# Patient Record
Sex: Female | Born: 1945 | Race: White | Hispanic: No | Smoking: Former smoker
Health system: Southern US, Community
[De-identification: ages and names within clinical notes are randomized; demographics above are authoritative.]

## PROBLEM LIST (undated history)

## (undated) DIAGNOSIS — G4733 Obstructive sleep apnea (adult) (pediatric): Secondary | ICD-10-CM

## (undated) DIAGNOSIS — N189 Chronic kidney disease, unspecified: Secondary | ICD-10-CM

## (undated) DIAGNOSIS — M7552 Bursitis of left shoulder: Secondary | ICD-10-CM

## (undated) DIAGNOSIS — Z8719 Personal history of other diseases of the digestive system: Secondary | ICD-10-CM

## (undated) DIAGNOSIS — I351 Nonrheumatic aortic (valve) insufficiency: Secondary | ICD-10-CM

## (undated) DIAGNOSIS — Z9889 Other specified postprocedural states: Secondary | ICD-10-CM

## (undated) DIAGNOSIS — I10 Essential (primary) hypertension: Secondary | ICD-10-CM

## (undated) DIAGNOSIS — J45909 Unspecified asthma, uncomplicated: Secondary | ICD-10-CM

## (undated) DIAGNOSIS — E669 Obesity, unspecified: Secondary | ICD-10-CM

## (undated) DIAGNOSIS — M1990 Unspecified osteoarthritis, unspecified site: Secondary | ICD-10-CM

## (undated) DIAGNOSIS — Z9989 Dependence on other enabling machines and devices: Secondary | ICD-10-CM

## (undated) DIAGNOSIS — D649 Anemia, unspecified: Secondary | ICD-10-CM

## (undated) DIAGNOSIS — R079 Chest pain, unspecified: Secondary | ICD-10-CM

## (undated) DIAGNOSIS — I340 Nonrheumatic mitral (valve) insufficiency: Secondary | ICD-10-CM

## (undated) DIAGNOSIS — I480 Paroxysmal atrial fibrillation: Secondary | ICD-10-CM

## (undated) DIAGNOSIS — E039 Hypothyroidism, unspecified: Secondary | ICD-10-CM

## (undated) DIAGNOSIS — I509 Heart failure, unspecified: Secondary | ICD-10-CM

## (undated) DIAGNOSIS — E785 Hyperlipidemia, unspecified: Secondary | ICD-10-CM

## (undated) DIAGNOSIS — D369 Benign neoplasm, unspecified site: Secondary | ICD-10-CM

## (undated) DIAGNOSIS — E559 Vitamin D deficiency, unspecified: Secondary | ICD-10-CM

## (undated) DIAGNOSIS — G43909 Migraine, unspecified, not intractable, without status migrainosus: Secondary | ICD-10-CM

## (undated) DIAGNOSIS — K219 Gastro-esophageal reflux disease without esophagitis: Secondary | ICD-10-CM

## (undated) DIAGNOSIS — R112 Nausea with vomiting, unspecified: Secondary | ICD-10-CM

## (undated) DIAGNOSIS — J42 Unspecified chronic bronchitis: Secondary | ICD-10-CM

## (undated) HISTORY — DX: Unspecified asthma, uncomplicated: J45.909

## (undated) HISTORY — DX: Nonrheumatic mitral (valve) insufficiency: I34.0

## (undated) HISTORY — PX: HERNIA REPAIR: SHX51

## (undated) HISTORY — DX: Chronic kidney disease, unspecified: N18.9

## (undated) HISTORY — DX: Nonrheumatic aortic (valve) insufficiency: I35.1

## (undated) HISTORY — DX: Paroxysmal atrial fibrillation: I48.0

## (undated) HISTORY — DX: Chest pain, unspecified: R07.9

## (undated) HISTORY — DX: Vitamin D deficiency, unspecified: E55.9

## (undated) HISTORY — DX: Gastro-esophageal reflux disease without esophagitis: K21.9

## (undated) HISTORY — DX: Essential (primary) hypertension: I10

## (undated) HISTORY — PX: APPENDECTOMY: SHX54

## (undated) HISTORY — DX: Hyperlipidemia, unspecified: E78.5

## (undated) HISTORY — DX: Obesity, unspecified: E66.9

## (undated) HISTORY — PX: CATARACT EXTRACTION W/ INTRAOCULAR LENS  IMPLANT, BILATERAL: SHX1307

## (undated) HISTORY — DX: Migraine, unspecified, not intractable, without status migrainosus: G43.909

## (undated) HISTORY — DX: Benign neoplasm, unspecified site: D36.9

---
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
## 1972-04-15 HISTORY — PX: TONSILLECTOMY AND ADENOIDECTOMY: SUR1326

## 1979-04-16 HISTORY — PX: COMBINED HYSTERECTOMY VAGINAL / OOPHORECTOMY / A&P REPAIR: SUR294

## 1979-04-16 HISTORY — PX: VAGINAL HYSTERECTOMY: SUR661

## 1997-07-21 ENCOUNTER — Encounter: Admission: RE | Admit: 1997-07-21 | Discharge: 1997-10-19 | Payer: Self-pay | Admitting: *Deleted

## 1998-04-03 ENCOUNTER — Emergency Department (HOSPITAL_COMMUNITY): Admission: EM | Admit: 1998-04-03 | Discharge: 1998-04-03 | Payer: Self-pay | Admitting: Emergency Medicine

## 1998-05-26 ENCOUNTER — Ambulatory Visit (HOSPITAL_COMMUNITY): Admission: RE | Admit: 1998-05-26 | Discharge: 1998-05-26 | Payer: Self-pay | Admitting: Gastroenterology

## 1999-04-05 ENCOUNTER — Encounter: Admission: RE | Admit: 1999-04-05 | Discharge: 1999-04-05 | Payer: Self-pay | Admitting: *Deleted

## 1999-04-05 ENCOUNTER — Encounter: Payer: Self-pay | Admitting: *Deleted

## 1999-07-06 ENCOUNTER — Encounter (INDEPENDENT_AMBULATORY_CARE_PROVIDER_SITE_OTHER): Payer: Self-pay | Admitting: *Deleted

## 1999-07-06 ENCOUNTER — Ambulatory Visit (HOSPITAL_COMMUNITY): Admission: RE | Admit: 1999-07-06 | Discharge: 1999-07-06 | Payer: Self-pay | Admitting: Gastroenterology

## 2000-04-10 ENCOUNTER — Encounter: Admission: RE | Admit: 2000-04-10 | Discharge: 2000-04-10 | Payer: Self-pay | Admitting: *Deleted

## 2000-04-10 ENCOUNTER — Encounter: Payer: Self-pay | Admitting: *Deleted

## 2001-05-07 ENCOUNTER — Encounter: Payer: Self-pay | Admitting: *Deleted

## 2001-05-07 ENCOUNTER — Encounter: Admission: RE | Admit: 2001-05-07 | Discharge: 2001-05-07 | Payer: Self-pay | Admitting: *Deleted

## 2001-12-27 ENCOUNTER — Encounter: Payer: Self-pay | Admitting: Emergency Medicine

## 2001-12-27 ENCOUNTER — Emergency Department (HOSPITAL_COMMUNITY): Admission: EM | Admit: 2001-12-27 | Discharge: 2001-12-27 | Payer: Self-pay | Admitting: Emergency Medicine

## 2002-02-19 ENCOUNTER — Ambulatory Visit (HOSPITAL_COMMUNITY): Admission: RE | Admit: 2002-02-19 | Discharge: 2002-02-19 | Payer: Self-pay | Admitting: Cardiology

## 2002-05-20 ENCOUNTER — Encounter: Admission: RE | Admit: 2002-05-20 | Discharge: 2002-05-20 | Payer: Self-pay

## 2004-06-28 ENCOUNTER — Encounter: Admission: RE | Admit: 2004-06-28 | Discharge: 2004-06-28 | Payer: Self-pay | Admitting: Family Medicine

## 2005-08-22 ENCOUNTER — Encounter: Admission: RE | Admit: 2005-08-22 | Discharge: 2005-08-22 | Payer: Self-pay | Admitting: Family Medicine

## 2005-12-05 ENCOUNTER — Encounter: Admission: RE | Admit: 2005-12-05 | Discharge: 2005-12-05 | Payer: Self-pay | Admitting: Family Medicine

## 2005-12-12 ENCOUNTER — Encounter: Admission: RE | Admit: 2005-12-12 | Discharge: 2005-12-12 | Payer: Self-pay | Admitting: Family Medicine

## 2006-09-17 ENCOUNTER — Encounter: Admission: RE | Admit: 2006-09-17 | Discharge: 2006-09-17 | Payer: Self-pay | Admitting: Family Medicine

## 2007-09-23 ENCOUNTER — Encounter: Admission: RE | Admit: 2007-09-23 | Discharge: 2007-09-23 | Payer: Self-pay | Admitting: Family Medicine

## 2008-07-22 ENCOUNTER — Ambulatory Visit (HOSPITAL_COMMUNITY): Admission: RE | Admit: 2008-07-22 | Discharge: 2008-07-22 | Payer: Self-pay | Admitting: Surgery

## 2008-08-02 ENCOUNTER — Encounter: Admission: RE | Admit: 2008-08-02 | Discharge: 2008-10-31 | Payer: Self-pay | Admitting: Surgery

## 2008-09-13 HISTORY — PX: LAPAROSCOPIC GASTRIC BANDING WITH HIATAL HERNIA REPAIR: SHX6351

## 2008-09-15 ENCOUNTER — Ambulatory Visit (HOSPITAL_COMMUNITY): Admission: RE | Admit: 2008-09-15 | Discharge: 2008-09-16 | Payer: Self-pay | Admitting: Surgery

## 2008-10-10 ENCOUNTER — Encounter: Admission: RE | Admit: 2008-10-10 | Discharge: 2008-10-10 | Payer: Self-pay | Admitting: Family Medicine

## 2008-11-23 ENCOUNTER — Encounter: Admission: RE | Admit: 2008-11-23 | Discharge: 2008-12-22 | Payer: Self-pay | Admitting: Surgery

## 2009-03-29 ENCOUNTER — Encounter: Admission: RE | Admit: 2009-03-29 | Discharge: 2009-04-12 | Payer: Self-pay | Admitting: Surgery

## 2009-10-12 ENCOUNTER — Encounter: Admission: RE | Admit: 2009-10-12 | Discharge: 2009-10-12 | Payer: Self-pay | Admitting: Family Medicine

## 2010-02-25 ENCOUNTER — Emergency Department (HOSPITAL_COMMUNITY): Admission: EM | Admit: 2010-02-25 | Discharge: 2010-02-25 | Payer: Self-pay | Admitting: Emergency Medicine

## 2010-06-27 LAB — POCT CARDIAC MARKERS
CKMB, poc: <1 ng/mL — ABNORMAL LOW (ref 1.0–8.0)
Myoglobin, poc: 71.1 ng/mL (ref 12–200)
Troponin i, poc: <0.05 ng/mL (ref 0.00–0.09)

## 2010-06-27 LAB — POCT I-STAT, CHEM 8
Calcium, Ion: 1.04 mmol/L — ABNORMAL LOW (ref 1.12–1.32)
Chloride: 104 mEq/L (ref 96–112)
Glucose, Bld: 97 mg/dL (ref 70–99)
HCT: 47 % — ABNORMAL HIGH (ref 36.0–46.0)
Hemoglobin: 16 g/dL — ABNORMAL HIGH (ref 12.0–15.0)
Potassium: 3.9 mEq/L (ref 3.5–5.1)
TCO2: 28 mmol/L (ref 0–100)

## 2010-07-23 LAB — CBC
HCT: 36.8 % (ref 36.0–46.0)
MCHC: 32.4 g/dL (ref 30.0–36.0)
MCV: 78 fL (ref 78.0–100.0)
Platelets: 294 10*3/uL (ref 150–400)
WBC: 15.5 10*3/uL — ABNORMAL HIGH (ref 4.0–10.5)

## 2010-07-23 LAB — DIFFERENTIAL
Eosinophils Relative: 0 % (ref 0–5)
Lymphocytes Relative: 5 % — ABNORMAL LOW (ref 12–46)
Monocytes Absolute: 0.7 10*3/uL (ref 0.1–1.0)

## 2010-07-24 LAB — COMPREHENSIVE METABOLIC PANEL
AST: 29 U/L (ref 0–37)
BUN: 24 mg/dL — ABNORMAL HIGH (ref 6–23)
CO2: 28 mEq/L (ref 19–32)
GFR calc Af Amer: 60 mL/min — ABNORMAL LOW (ref >60)
GFR calc non Af Amer: 49 mL/min — ABNORMAL LOW (ref >60)
Glucose, Bld: 96 mg/dL (ref 70–99)
Sodium: 140 mEq/L (ref 135–145)

## 2010-07-24 LAB — DIFFERENTIAL
Basophils Relative: 0 % (ref 0–1)
Eosinophils Absolute: 0.2 10*3/uL (ref 0.0–0.7)
Eosinophils Relative: 1 % (ref 0–5)
Lymphocytes Relative: 16 % (ref 12–46)
Lymphs Abs: 1.9 10*3/uL (ref 0.7–4.0)
Neutrophils Relative %: 77 % (ref 43–77)

## 2010-08-28 NOTE — Op Note (Signed)
NAMEGARY, BULTMAN               ACCOUNT NO.:  [id number]   MEDICAL RECORD NO.:  [id number]          PATIENT TYPE:  OIB   LOCATION:  0098                         FACILITY:  [facility]   PHYSICIAN:  Sandria Bales. Ezzard Standing, M.D.  DATE OF BIRTH:  01-Apr-1946   DATE OF PROCEDURE:  09/15/2008  DATE OF DISCHARGE:                               OPERATIVE REPORT   Date of Surgery - 15 September 2008.   PREOPERATIVE DIAGNOSES:  Morbid obesity (weight 299, BMI of 47.5),  hiatal hernia.   POSTOPERATIVE DIAGNOSES:  Morbid obesity  (weight 299, will BMI of  47.5), small hiatal hernia with Barrett's esophagitis.   PROCEDURE:  Laparoscopic band with APL band, subcutaneous port, excision  of the anterior gastric fat pad, hiatal hernia repair with single  stitch, upper endoscopy.   SURGEON:  Sandria Bales. Ezzard Standing, M.D.   FIRST ASSISTANT:  Dr. Wenda Low.   ANESTHESIA:  General endotracheal with 30 mL of 0.25% Marcaine.   COMPLICATIONS:  None.   INDICATIONS FOR PROCEDURE:  Ms. Huard is a 65 year old white female who  is a patient of Shaune Pollack, has been morbidly obese much of her adult  life.  She has been through our preoperative bariatric program and now  comes for attempted laparoscopic banding placement.   The indications and potential complications of the band have been  explained to the patient.   She has also been seen by Dr. Matthias Hughs and been found to have reflux with  Barrett esophagitis.  She does have a small hiatal hernia on upper GI so  we will look at her hiatus of the time of surgery.   The potential complication of lap band surgery include, but are not  limited to, bleeding, infection, perforation of bowel, slippage or  erosion of the band and long-term nutritional consequences.   OPERATIVE NOTE:  The patient placed in supine position, given a general  endotracheal anesthetic.  Her abdomen was prepped with a ChloraPrep and  sterilely draped.  A time-out was held identifying the patient and  the  procedure.   I accessed the abdominal cavity through a left upper quadrant using 11  mm Ethicon OptiVu trocar.  I then placed 5 additional trocars, a 5-mm  subxiphoid trocar for the liver retractor, a 15-mm right subcostal  trocar for band introduction, an 11 mm right paramedian trocar, an 11 mm  paramedian Ethicon trocar and then a 5 mm lateral trocar.   The abdomen was explored laparoscopically.  Right and left lobes of  liver were unremarkable.  There were some fatty changes.  I could not  see the gallbladder.  The stomach was unremarkable.  The bowel that I  could see was unremarkable.  She really had a fairly generous omentum  that covered most of her bowel.   I then turned my attention to her gastroesophageal junction.  I opened a  window to the left of the gastroesophageal junction, identified the left  crus.  I dissected out  the right crus.  I dissected posteriorly and  passed the finger dissector posteriorly to the gastroesophageal junction  and  introduced an AP large lap band. I passed the tubing around.  I then  carried out dissection trying to look for a hiatal hernia.  We tried  multiple times trying to pass the sizing tube and both the nurse  anesthetist and the anesthesiologist could not get the sizing tube down.   At this point Dr. Wenda Low broke scrub to pass the flexible Olympus  endoscope down to the stomach and noted the esophagogastric junction at  about 40 cm.  She did have some small erosions immediately proximal to  the gastroesophageal junction which probably represented the Barrett's  esophagus.  She did not have much of a hiatal hernia endoscopically.  We  did get the scope into the stomach.  We then used that to make sure we  had identified her GE junction.  I then carried out the dissection  posterior to just to see if I could find much of a hiatal hernia.  She  did have some separation with a moderate defect in the hiatus.  I did  put a single  suture suturing the left crus to the right crus posteriorly  using a 0 Ethibond  suture with a Ti-KNOT.   Since I could not really do a good sizing test, I was uncomfortable  putting any more sutures posteriorly and let this be the only suture.   I then advanced the AP large band around the proximal stomach.  I  snapped it closed over the endoscope.  The endoscope was then removed.  I then imbricated the stomach over the band using three 0 Ethibond  sutures.  Prior to placing the band, I excised a generous fat pad which  was probably 8 or 9 cm in length about 2 to 3 cm in thickness.  I took  this off the anterior stomach using a harmonic scalpel and I thought I  was well away from the stomach wall during this dissection and there was  no active bleeding.  Again after I snapped the band down, I used the  Ethibond sutures and placed three imbricating sutures.  I then took  photos at the end of the procedure where the band appeared to lie in a  good position.   There was no bleeding at either dissection site around the band or  around the stomach and the abdomen was desufflated, the trocars removed  in turn with no bleeding at the trocar sites.   The tubing of the band was brought out through the right paramedian  incision.  This was attached to the port because she has such a thick  abdominal wall.  I did close this as a subcutaneous port with mesh  backing on the back of the port.   The port lay flat lateral to the incision.  Each incision was closed  with 5-0 Vicryl suture was painted with tincture of Benzoin and steri-  stripped.  The patient tolerated the procedure well, was transported to  recovery room in good condition.  Sponge and needle count were correct  at the end of the case.      Sandria Bales. Ezzard Standing, M.D.  Electronically Signed     DHN/MEDQ  D:  09/15/2008  T:  09/15/2008  Job:  [id number]   cc:   [name], M.D.  Fax: [phone]   Bernette Redbird, M.D.  Fax:  [phone]

## 2010-08-31 NOTE — H&P (Signed)
NAME:  Leslie [name], Leslie [name]                         ACCOUNT NO.:  [id number]   MEDICAL RECORD NO.:  [id number]                   PATIENT TYPE:   LOCATION:                                       FACILITY:  [facility]   PHYSICIAN:  Colleen Can. Deborah Chalk, M.D.            DATE OF BIRTH:  [birth year]   DATE OF ADMISSION:  02/19/2002  DATE OF DISCHARGE:                                HISTORY & PHYSICAL   CHIEF COMPLAINT:  Previous episode of chest pain.   HISTORY OF PRESENT ILLNESS:  Ms. Leslie [name] is a 65 year old obese white female  who was referred to our office for evaluation in the later part of 9/03. She  was seen after being evaluated in the emergency room for chest pain on  12/27/01. With that, she had a pressure-like sensation in the left chest that  radiated to the left shoulder and was somewhat associated with tingling of  her arms and fingers on both hands. She had some vague nausea as well. Two  days prior to that, she had been exercising at [employer] and noted that she had  felt quite well. She was wondering, perhaps, if this could be  musculoskeletal discomfort in origin. Her blood pressure has been elevated  as well. Her evaluation in the emergency room was unremarkable. She was  referred for stress Cardiolite study which was performed on 01/28/02. She  exercised on a standard Bruce protocol for a total of 4 minutes and 31  seconds to a maximum of 2.5 miles per hour at 12% grade. Her heart rate rose  to 158, blood pressure rose to 150/80. The test was stopped due to right leg  discomfort. There were no complaints of angina.   Her resting electrocardiogram showed nonspecific changes. With exercise,  there were no changes to suggest ischemia. Her ejection fraction was 69%.  There was ischemia in the anterior wall that was noted. In light of these  findings, she is referred on now for elective cardiac catheterization.   PAST MEDICAL HISTORY:  1. Obesity.  2. Hypertension.  3. Hyperlipidemia.  4. History of left ventricular hypertrophy with last 2-D echocardiogram     dating back to 1997.  5. History of palpitations.  6. Previous left inguinal hernia repair.  7. Tonsillectomy.  8. Vaginal hysterectomy.  9. Appendectomy.  10.      History of left oophorectomy.  11.      Childbirth x2.   ALLERGIES:  Demerol and codeine.   CURRENT MEDICATIONS:  1. Toprol-XL 100 mg a day.  2. Allegra b.i.d.  3. Premarin daily.  4. Flonase daily.  5. Prilosec 20 mg a day.  6. Diovan HCT 80/12.5 daily.   FAMILY HISTORY:  Father died at 27 with heart disease and emphysema. Mother  at 93 of pulmonary edema.   SOCIAL HISTORY:  She has no tobacco use and only uses social alcohol.   REVIEW OF SYMPTOMS:  As noted above and is otherwise unremarkable.   PHYSICAL EXAMINATION:  VITAL SIGNS:  Weight 254 pounds, blood pressure  110/70 sitting-114/80 standing, heart rate is 88 and regular, respirations  18. She is afebrile.  SKIN:  Warm and dry. Color is unremarkable.  LUNGS:  Clear.  HEART:  Shows a regular rhythm.  ABDOMEN:  Obese, soft, positive bowel sounds.  EXTREMITIES:  Without edema.  NEUROLOGICAL:  Intact.   LABORATORY DATA:  Pending.   IMPRESSION:  1. Previous episode of chest pain in the setting of an abnormal stress     Cardiolite study.  2. Hypertension.  3. Obesity.   PLAN:  We will proceed on with elective cardiac catheterization.     Juanell Fairly C. Earl Gala, N.P.                 Colleen Can. Deborah Chalk, M.D.    LCO/MEDQ  D:  02/10/2002  T:  02/10/2002  Job:  [id number]   cc:   Heather Roberts, M.D.  [address]  [place]  [place] [address]  Fax: [phone]

## 2010-08-31 NOTE — Procedures (Signed)
[place]  Patient:    Leslie [name], Leslie [name]                      MRN: [id number] Proc. Date: 07/06/99 Adm. Date:  [id number] Attending:  Rich Brave CC:         Heather Roberts, M.D.                           Procedure Report  PROCEDURE:  Upper endoscopy with biopsies.  INDICATION:  Followup of Barretts esophagus with biopsies a year ago showing no  evidence of dysplasia.  ENDOSCOPIST:  Florencia Reasons, M.D.  FINDINGS:  Short segment Barretts esophagus, biopsied.  Small amount of free gastroesophageal reflux and small hiatal hernia present.  DESCRIPTION OF PROCEDURE:  The nature, purpose, risks of the procedure were discussed with the patient prior to examination, and she provided written consent. Sedation was first premedication with Zofran 4 mg IV because of history of severe nausea in association with prior anesthesia, droperidol 5 mg, and Versed 10 mg V to a level of mild to moderate sedation without arrhythmias or desaturation.  The Olympus small-caliber adult video endoscope was passed easily under direct vision.  The larynx and vocal cords were unremarkable.  The esophagus was easily entered.  The proximal esophagus was normal, but there was Barretts esophagus involving the distal region of the esophagus, extending in a tongue-like fashion 1 or 2 cm up the esophagus.  Multiple biopsies were obtained from these "tongues" at the conclusion of the procedure.  There was a 2 cm hiatal hernia present.  The stomach was entered.  It contained no significant residual, just a small to moderate amount of clear fluid which was suctioned out.  No gastritis, erosions, ulcers, polyps, or masses were seen, and a retroflex view of the proximal stomach showed a slightly patulous diaphragmatic hiatus and the hiatal hernia from its inferior perspective. The pylorus, duodenal bulb, and second duodenum looked normal.  The patient  tolerated the procedure well, and there were no apparent complications. There was no evidence of any esophageal ring or stricture.  IMPRESSION: 1. Short segment Barretts esophagus without active esophagitis. 2. Small hiatal hernia.  PLAN:  Await pathology on biopsies with endoscopic followup in one to two years. DD:  07/06/99 TD:  07/06/99 Job: [id number]

## 2010-08-31 NOTE — Cardiovascular Report (Signed)
NAME:  Leslie [name], Leslie [name]                         ACCOUNT NO.:  [id number]   MEDICAL RECORD NO.:  [id number]                   PATIENT TYPE:  OIB   LOCATION:  2852                                 FACILITY:  [facility]   PHYSICIAN:  Colleen Can. Deborah Chalk, M.D.            DATE OF BIRTH:  1945/04/23   DATE OF PROCEDURE:  02/19/2002  DATE OF DISCHARGE:  02/19/2002                              CARDIAC CATHETERIZATION   HISTORY:  The patient is a 65 year old female who presents with an abnormal  Cardiolite, with abnormal anterior perfusion.  She is obese as well as has a  history of hypertension, and a family history of heart disease.   PROCEDURE:  Left heart catheterization with selective coronary angiography,  and left ventriculography.   TYPE AND SITE OF ENTRY:  Percutaneous right femoral artery, with Perclose.   CATHETERS:  A  6-French 4 curved Judkins right and left coronary catheters,  a #6-French pigtail ventricular catheter.   CONTRAST MATERIAL:  Omnipaque.   MEDICATIONS GIVEN PRIOR TO PROCEDURE:  Valium 10 mg p.o.   MEDICATIONS DURING PROCEDURE:  Versed 2 mg IV.   COMMENTS:  The patient tolerated the procedure well.   HEMODYNAMIC DATA:  The aortic pressure was 145/86, LV was 141/16.  There was  no aortic valve gradient noted on pullback.   ANGIOGRAPHIC DATA:  The left ventricular angiogram was performed in the RAO  position.  Overall cardiac size was a little upward of normal.  Left  ventricular function is normal with an ejection fraction of 60%.  Regional  wall motion is normal.   CORONARY ARTERIES:  Coronary arteries arise and distribute normally.  1. Right coronary artery.  The right coronary artery is a dominant vessel.     The artery ends primarily as a large posterior descending vessel that     extends up to the apex.  There is not a significant continuation branch     past the crux.  Right coronary artery has very minimal irregularities but     is essentially normal.  2. Left main coronary artery.  The left main coronary artery is short and     normal.  3. Left circumflex.  The left circumflex is a moderately large system.  It     is normal.  There is a high bifurcating obtuse marginal branch and three     distal posterolateral branches.  4. Left anterior descending.  Left anterior descending is a moderately large     vessel.  There are minimal irregularities.  The left anterior descending     ends on the anterior wall at the apex.  It is normal.   OVERALL IMPRESSION:  1. Normal left ventricular function.  2. Essentially normal coronary arteries with minimal irregularities.   DISCUSSION:  It is felt that the patient will need to modify cardiovascular  risk factors at this point in time.  Her end-diastolic pressure is  slightly  elevated, and we will discuss this finding with her and encourage  modification of risk factors and, in particular, weight loss.                                               Colleen Can. Deborah Chalk, M.D.    SNT/MEDQ  D:  02/19/2002  T:  02/20/2002  Job:  [id number]   cc:   Heather Roberts, M.D.  [address]  [place]  [place] [address]  Fax: [phone]

## 2010-09-06 ENCOUNTER — Other Ambulatory Visit: Payer: Self-pay | Admitting: Family Medicine

## 2010-09-06 DIAGNOSIS — Z1231 Encounter for screening mammogram for malignant neoplasm of breast: Secondary | ICD-10-CM

## 2010-10-16 ENCOUNTER — Ambulatory Visit
Admission: RE | Admit: 2010-10-16 | Discharge: 2010-10-16 | Disposition: A | Discharge status: Home / Self Care | Payer: PRIVATE HEALTH INSURANCE | Source: Ambulatory Visit | Attending: Family Medicine | Admitting: Family Medicine

## 2010-10-16 DIAGNOSIS — Z1231 Encounter for screening mammogram for malignant neoplasm of breast: Secondary | ICD-10-CM

## 2011-03-11 ENCOUNTER — Telehealth (INDEPENDENT_AMBULATORY_CARE_PROVIDER_SITE_OTHER): Payer: Self-pay | Admitting: Surgery

## 2011-03-11 NOTE — Telephone Encounter (Signed)
03/11/11 mailed recall to patient for bariatric surgery follow-up. Adv pt to call [facility] to schedule an appt...cef °

## 2011-07-04 ENCOUNTER — Other Ambulatory Visit: Payer: Self-pay | Admitting: Dermatology

## 2011-07-04 DIAGNOSIS — L821 Other seborrheic keratosis: Secondary | ICD-10-CM | POA: Diagnosis not present

## 2011-07-04 DIAGNOSIS — L820 Inflamed seborrheic keratosis: Secondary | ICD-10-CM | POA: Diagnosis not present

## 2011-07-04 DIAGNOSIS — L909 Atrophic disorder of skin, unspecified: Secondary | ICD-10-CM | POA: Diagnosis not present

## 2011-07-04 DIAGNOSIS — L570 Actinic keratosis: Secondary | ICD-10-CM | POA: Diagnosis not present

## 2011-07-04 DIAGNOSIS — L919 Hypertrophic disorder of the skin, unspecified: Secondary | ICD-10-CM | POA: Diagnosis not present

## 2011-09-12 ENCOUNTER — Other Ambulatory Visit: Payer: Self-pay | Admitting: Family Medicine

## 2011-09-12 DIAGNOSIS — Z1231 Encounter for screening mammogram for malignant neoplasm of breast: Secondary | ICD-10-CM

## 2011-09-13 ENCOUNTER — Other Ambulatory Visit: Payer: Self-pay | Admitting: Gastroenterology

## 2011-09-13 DIAGNOSIS — D126 Benign neoplasm of colon, unspecified: Secondary | ICD-10-CM | POA: Diagnosis not present

## 2011-09-13 DIAGNOSIS — K2270 Barrett's esophagus without dysplasia: Secondary | ICD-10-CM | POA: Diagnosis not present

## 2011-09-13 DIAGNOSIS — Z86010 Personal history of colonic polyps: Secondary | ICD-10-CM | POA: Diagnosis not present

## 2011-09-13 DIAGNOSIS — Z09 Encounter for follow-up examination after completed treatment for conditions other than malignant neoplasm: Secondary | ICD-10-CM | POA: Diagnosis not present

## 2011-10-21 ENCOUNTER — Ambulatory Visit
Admission: RE | Admit: 2011-10-21 | Discharge: 2011-10-21 | Disposition: A | Discharge status: Home / Self Care | Payer: Medicare Other | Source: Ambulatory Visit | Attending: Family Medicine | Admitting: Family Medicine

## 2011-10-21 DIAGNOSIS — Z1231 Encounter for screening mammogram for malignant neoplasm of breast: Secondary | ICD-10-CM | POA: Diagnosis not present

## 2011-12-26 DIAGNOSIS — E039 Hypothyroidism, unspecified: Secondary | ICD-10-CM | POA: Diagnosis not present

## 2011-12-26 DIAGNOSIS — Z79899 Other long term (current) drug therapy: Secondary | ICD-10-CM | POA: Diagnosis not present

## 2011-12-26 DIAGNOSIS — Z23 Encounter for immunization: Secondary | ICD-10-CM | POA: Diagnosis not present

## 2011-12-26 DIAGNOSIS — Z0000 Encounter for general adult medical examination without abnormal findings: Secondary | ICD-10-CM | POA: Diagnosis not present

## 2011-12-26 DIAGNOSIS — Z9884 Bariatric surgery status: Secondary | ICD-10-CM | POA: Diagnosis not present

## 2011-12-26 DIAGNOSIS — I10 Essential (primary) hypertension: Secondary | ICD-10-CM | POA: Diagnosis not present

## 2011-12-26 DIAGNOSIS — E7800 Pure hypercholesterolemia, unspecified: Secondary | ICD-10-CM | POA: Diagnosis not present

## 2011-12-26 DIAGNOSIS — E669 Obesity, unspecified: Secondary | ICD-10-CM | POA: Diagnosis not present

## 2011-12-26 DIAGNOSIS — E559 Vitamin D deficiency, unspecified: Secondary | ICD-10-CM | POA: Diagnosis not present

## 2012-01-22 DIAGNOSIS — J40 Bronchitis, not specified as acute or chronic: Secondary | ICD-10-CM | POA: Diagnosis not present

## 2012-01-29 ENCOUNTER — Other Ambulatory Visit: Payer: Self-pay | Admitting: Family Medicine

## 2012-01-29 ENCOUNTER — Ambulatory Visit
Admission: RE | Admit: 2012-01-29 | Discharge: 2012-01-29 | Disposition: A | Discharge status: Home / Self Care | Payer: Medicare Other | Source: Ambulatory Visit | Attending: Family Medicine | Admitting: Family Medicine

## 2012-01-29 DIAGNOSIS — R059 Cough, unspecified: Secondary | ICD-10-CM

## 2012-01-29 DIAGNOSIS — R05 Cough: Secondary | ICD-10-CM

## 2012-02-05 DIAGNOSIS — L909 Atrophic disorder of skin, unspecified: Secondary | ICD-10-CM | POA: Diagnosis not present

## 2012-02-05 DIAGNOSIS — L570 Actinic keratosis: Secondary | ICD-10-CM | POA: Diagnosis not present

## 2012-02-05 DIAGNOSIS — L820 Inflamed seborrheic keratosis: Secondary | ICD-10-CM | POA: Diagnosis not present

## 2012-02-05 DIAGNOSIS — D2370 Other benign neoplasm of skin of unspecified lower limb, including hip: Secondary | ICD-10-CM | POA: Diagnosis not present

## 2012-02-05 DIAGNOSIS — L821 Other seborrheic keratosis: Secondary | ICD-10-CM | POA: Diagnosis not present

## 2012-02-05 DIAGNOSIS — L739 Follicular disorder, unspecified: Secondary | ICD-10-CM | POA: Diagnosis not present

## 2012-02-05 DIAGNOSIS — D1801 Hemangioma of skin and subcutaneous tissue: Secondary | ICD-10-CM | POA: Diagnosis not present

## 2012-02-05 DIAGNOSIS — L723 Sebaceous cyst: Secondary | ICD-10-CM | POA: Diagnosis not present

## 2012-02-05 DIAGNOSIS — L919 Hypertrophic disorder of the skin, unspecified: Secondary | ICD-10-CM | POA: Diagnosis not present

## 2012-02-05 DIAGNOSIS — D239 Other benign neoplasm of skin, unspecified: Secondary | ICD-10-CM | POA: Diagnosis not present

## 2012-03-06 DIAGNOSIS — R059 Cough, unspecified: Secondary | ICD-10-CM | POA: Diagnosis not present

## 2012-03-06 DIAGNOSIS — J45909 Unspecified asthma, uncomplicated: Secondary | ICD-10-CM | POA: Diagnosis not present

## 2012-03-06 DIAGNOSIS — J069 Acute upper respiratory infection, unspecified: Secondary | ICD-10-CM | POA: Diagnosis not present

## 2012-03-06 DIAGNOSIS — R05 Cough: Secondary | ICD-10-CM | POA: Diagnosis not present

## 2012-03-19 DIAGNOSIS — Z13820 Encounter for screening for osteoporosis: Secondary | ICD-10-CM | POA: Diagnosis not present

## 2012-03-31 DIAGNOSIS — E559 Vitamin D deficiency, unspecified: Secondary | ICD-10-CM | POA: Diagnosis not present

## 2012-04-16 ENCOUNTER — Telehealth (INDEPENDENT_AMBULATORY_CARE_PROVIDER_SITE_OTHER): Payer: Self-pay | Admitting: Surgery

## 2012-04-16 NOTE — Telephone Encounter (Signed)
03/17/12 mailed recall letter for bariatric surgery follow-up to pt. Advised pt to call [facility] at [phone] to °schedule appt. (lss) ° °

## 2012-06-25 DIAGNOSIS — E559 Vitamin D deficiency, unspecified: Secondary | ICD-10-CM | POA: Diagnosis not present

## 2012-06-25 DIAGNOSIS — I10 Essential (primary) hypertension: Secondary | ICD-10-CM | POA: Diagnosis not present

## 2012-06-25 DIAGNOSIS — E039 Hypothyroidism, unspecified: Secondary | ICD-10-CM | POA: Diagnosis not present

## 2012-06-25 DIAGNOSIS — E669 Obesity, unspecified: Secondary | ICD-10-CM | POA: Diagnosis not present

## 2012-06-25 DIAGNOSIS — J301 Allergic rhinitis due to pollen: Secondary | ICD-10-CM | POA: Diagnosis not present

## 2012-06-25 DIAGNOSIS — E7800 Pure hypercholesterolemia, unspecified: Secondary | ICD-10-CM | POA: Diagnosis not present

## 2012-06-25 DIAGNOSIS — N183 Chronic kidney disease, stage 3 unspecified: Secondary | ICD-10-CM | POA: Diagnosis not present

## 2012-07-30 DIAGNOSIS — E7800 Pure hypercholesterolemia, unspecified: Secondary | ICD-10-CM | POA: Diagnosis not present

## 2012-07-30 DIAGNOSIS — I10 Essential (primary) hypertension: Secondary | ICD-10-CM | POA: Diagnosis not present

## 2012-07-30 DIAGNOSIS — N183 Chronic kidney disease, stage 3 unspecified: Secondary | ICD-10-CM | POA: Diagnosis not present

## 2012-07-30 DIAGNOSIS — R7301 Impaired fasting glucose: Secondary | ICD-10-CM | POA: Diagnosis not present

## 2012-08-10 ENCOUNTER — Other Ambulatory Visit: Payer: Self-pay

## 2012-08-10 DIAGNOSIS — Z1231 Encounter for screening mammogram for malignant neoplasm of breast: Secondary | ICD-10-CM

## 2012-10-21 ENCOUNTER — Ambulatory Visit
Admission: RE | Admit: 2012-10-21 | Discharge: 2012-10-21 | Disposition: A | Discharge status: Home / Self Care | Payer: Medicare Other | Source: Ambulatory Visit

## 2012-10-21 DIAGNOSIS — Z1231 Encounter for screening mammogram for malignant neoplasm of breast: Secondary | ICD-10-CM | POA: Diagnosis not present

## 2012-11-23 DIAGNOSIS — L02219 Cutaneous abscess of trunk, unspecified: Secondary | ICD-10-CM | POA: Diagnosis not present

## 2012-11-23 DIAGNOSIS — L03319 Cellulitis of trunk, unspecified: Secondary | ICD-10-CM | POA: Diagnosis not present

## 2013-01-04 DIAGNOSIS — E039 Hypothyroidism, unspecified: Secondary | ICD-10-CM | POA: Diagnosis not present

## 2013-01-04 DIAGNOSIS — Z0000 Encounter for general adult medical examination without abnormal findings: Secondary | ICD-10-CM | POA: Diagnosis not present

## 2013-01-04 DIAGNOSIS — Z79899 Other long term (current) drug therapy: Secondary | ICD-10-CM | POA: Diagnosis not present

## 2013-01-04 DIAGNOSIS — N183 Chronic kidney disease, stage 3 unspecified: Secondary | ICD-10-CM | POA: Diagnosis not present

## 2013-01-04 DIAGNOSIS — E669 Obesity, unspecified: Secondary | ICD-10-CM | POA: Diagnosis not present

## 2013-01-04 DIAGNOSIS — I10 Essential (primary) hypertension: Secondary | ICD-10-CM | POA: Diagnosis not present

## 2013-01-04 DIAGNOSIS — E7800 Pure hypercholesterolemia, unspecified: Secondary | ICD-10-CM | POA: Diagnosis not present

## 2013-01-04 DIAGNOSIS — E559 Vitamin D deficiency, unspecified: Secondary | ICD-10-CM | POA: Diagnosis not present

## 2013-01-05 DIAGNOSIS — H25099 Other age-related incipient cataract, unspecified eye: Secondary | ICD-10-CM | POA: Diagnosis not present

## 2013-01-15 DIAGNOSIS — Z23 Encounter for immunization: Secondary | ICD-10-CM | POA: Diagnosis not present

## 2013-02-04 ENCOUNTER — Other Ambulatory Visit: Payer: Self-pay | Admitting: Dermatology

## 2013-02-04 DIAGNOSIS — D239 Other benign neoplasm of skin, unspecified: Secondary | ICD-10-CM | POA: Diagnosis not present

## 2013-02-04 DIAGNOSIS — L819 Disorder of pigmentation, unspecified: Secondary | ICD-10-CM | POA: Diagnosis not present

## 2013-02-04 DIAGNOSIS — L820 Inflamed seborrheic keratosis: Secondary | ICD-10-CM | POA: Diagnosis not present

## 2013-02-04 DIAGNOSIS — L821 Other seborrheic keratosis: Secondary | ICD-10-CM | POA: Diagnosis not present

## 2013-02-04 DIAGNOSIS — D2370 Other benign neoplasm of skin of unspecified lower limb, including hip: Secondary | ICD-10-CM | POA: Diagnosis not present

## 2013-02-04 DIAGNOSIS — D1801 Hemangioma of skin and subcutaneous tissue: Secondary | ICD-10-CM | POA: Diagnosis not present

## 2013-02-04 DIAGNOSIS — D485 Neoplasm of uncertain behavior of skin: Secondary | ICD-10-CM | POA: Diagnosis not present

## 2013-02-04 DIAGNOSIS — L851 Acquired keratosis [keratoderma] palmaris et plantaris: Secondary | ICD-10-CM | POA: Diagnosis not present

## 2013-02-15 DIAGNOSIS — N183 Chronic kidney disease, stage 3 unspecified: Secondary | ICD-10-CM | POA: Diagnosis not present

## 2013-02-15 DIAGNOSIS — R7301 Impaired fasting glucose: Secondary | ICD-10-CM | POA: Diagnosis not present

## 2013-06-07 ENCOUNTER — Encounter: Payer: Self-pay | Admitting: Cardiology

## 2013-06-07 DIAGNOSIS — L259 Unspecified contact dermatitis, unspecified cause: Secondary | ICD-10-CM

## 2013-06-07 DIAGNOSIS — E7800 Pure hypercholesterolemia, unspecified: Secondary | ICD-10-CM

## 2013-06-07 DIAGNOSIS — E559 Vitamin D deficiency, unspecified: Secondary | ICD-10-CM

## 2013-06-07 DIAGNOSIS — E669 Obesity, unspecified: Secondary | ICD-10-CM

## 2013-06-07 DIAGNOSIS — N183 Chronic kidney disease, stage 3 unspecified: Secondary | ICD-10-CM | POA: Insufficient documentation

## 2013-06-07 DIAGNOSIS — E039 Hypothyroidism, unspecified: Secondary | ICD-10-CM | POA: Insufficient documentation

## 2013-06-07 DIAGNOSIS — I10 Essential (primary) hypertension: Secondary | ICD-10-CM | POA: Insufficient documentation

## 2013-07-05 DIAGNOSIS — G43109 Migraine with aura, not intractable, without status migrainosus: Secondary | ICD-10-CM | POA: Diagnosis not present

## 2013-07-05 DIAGNOSIS — E039 Hypothyroidism, unspecified: Secondary | ICD-10-CM | POA: Diagnosis not present

## 2013-07-05 DIAGNOSIS — I10 Essential (primary) hypertension: Secondary | ICD-10-CM | POA: Diagnosis not present

## 2013-07-05 DIAGNOSIS — N183 Chronic kidney disease, stage 3 unspecified: Secondary | ICD-10-CM | POA: Diagnosis not present

## 2013-07-05 DIAGNOSIS — E559 Vitamin D deficiency, unspecified: Secondary | ICD-10-CM | POA: Diagnosis not present

## 2013-07-05 DIAGNOSIS — R7309 Other abnormal glucose: Secondary | ICD-10-CM | POA: Diagnosis not present

## 2013-07-05 DIAGNOSIS — E7800 Pure hypercholesterolemia, unspecified: Secondary | ICD-10-CM | POA: Diagnosis not present

## 2013-07-05 DIAGNOSIS — J301 Allergic rhinitis due to pollen: Secondary | ICD-10-CM | POA: Diagnosis not present

## 2013-08-02 DIAGNOSIS — E876 Hypokalemia: Secondary | ICD-10-CM | POA: Diagnosis not present

## 2013-08-30 DIAGNOSIS — E039 Hypothyroidism, unspecified: Secondary | ICD-10-CM | POA: Diagnosis not present

## 2013-09-14 ENCOUNTER — Other Ambulatory Visit: Payer: Self-pay

## 2013-09-14 DIAGNOSIS — Z1231 Encounter for screening mammogram for malignant neoplasm of breast: Secondary | ICD-10-CM

## 2013-10-26 ENCOUNTER — Ambulatory Visit
Admission: RE | Admit: 2013-10-26 | Discharge: 2013-10-26 | Disposition: A | Discharge status: Home / Self Care | Payer: Medicare Other | Source: Ambulatory Visit

## 2013-10-26 DIAGNOSIS — Z1231 Encounter for screening mammogram for malignant neoplasm of breast: Secondary | ICD-10-CM | POA: Diagnosis not present

## 2014-01-17 DIAGNOSIS — I10 Essential (primary) hypertension: Secondary | ICD-10-CM | POA: Diagnosis not present

## 2014-01-17 DIAGNOSIS — R7309 Other abnormal glucose: Secondary | ICD-10-CM | POA: Diagnosis not present

## 2014-01-17 DIAGNOSIS — E559 Vitamin D deficiency, unspecified: Secondary | ICD-10-CM | POA: Diagnosis not present

## 2014-01-17 DIAGNOSIS — Z0000 Encounter for general adult medical examination without abnormal findings: Secondary | ICD-10-CM | POA: Diagnosis not present

## 2014-01-17 DIAGNOSIS — N183 Chronic kidney disease, stage 3 (moderate): Secondary | ICD-10-CM | POA: Diagnosis not present

## 2014-01-17 DIAGNOSIS — E039 Hypothyroidism, unspecified: Secondary | ICD-10-CM | POA: Diagnosis not present

## 2014-01-17 DIAGNOSIS — Z1389 Encounter for screening for other disorder: Secondary | ICD-10-CM | POA: Diagnosis not present

## 2014-01-17 DIAGNOSIS — Z23 Encounter for immunization: Secondary | ICD-10-CM | POA: Diagnosis not present

## 2014-01-17 DIAGNOSIS — E780 Pure hypercholesterolemia: Secondary | ICD-10-CM | POA: Diagnosis not present

## 2014-02-28 DIAGNOSIS — J40 Bronchitis, not specified as acute or chronic: Secondary | ICD-10-CM | POA: Diagnosis not present

## 2014-04-21 DIAGNOSIS — L738 Other specified follicular disorders: Secondary | ICD-10-CM | POA: Diagnosis not present

## 2014-04-21 DIAGNOSIS — L919 Hypertrophic disorder of the skin, unspecified: Secondary | ICD-10-CM | POA: Diagnosis not present

## 2014-04-21 DIAGNOSIS — L718 Other rosacea: Secondary | ICD-10-CM | POA: Diagnosis not present

## 2014-04-21 DIAGNOSIS — D1801 Hemangioma of skin and subcutaneous tissue: Secondary | ICD-10-CM | POA: Diagnosis not present

## 2014-04-21 DIAGNOSIS — L7211 Pilar cyst: Secondary | ICD-10-CM | POA: Diagnosis not present

## 2014-04-21 DIAGNOSIS — L820 Inflamed seborrheic keratosis: Secondary | ICD-10-CM | POA: Diagnosis not present

## 2014-04-21 DIAGNOSIS — L821 Other seborrheic keratosis: Secondary | ICD-10-CM | POA: Diagnosis not present

## 2014-05-16 DIAGNOSIS — H25091 Other age-related incipient cataract, right eye: Secondary | ICD-10-CM | POA: Diagnosis not present

## 2014-06-07 DIAGNOSIS — J209 Acute bronchitis, unspecified: Secondary | ICD-10-CM | POA: Diagnosis not present

## 2014-07-18 DIAGNOSIS — I10 Essential (primary) hypertension: Secondary | ICD-10-CM | POA: Diagnosis not present

## 2014-07-18 DIAGNOSIS — R7309 Other abnormal glucose: Secondary | ICD-10-CM | POA: Diagnosis not present

## 2014-07-18 DIAGNOSIS — J301 Allergic rhinitis due to pollen: Secondary | ICD-10-CM | POA: Diagnosis not present

## 2014-07-18 DIAGNOSIS — N183 Chronic kidney disease, stage 3 (moderate): Secondary | ICD-10-CM | POA: Diagnosis not present

## 2014-07-18 DIAGNOSIS — E039 Hypothyroidism, unspecified: Secondary | ICD-10-CM | POA: Diagnosis not present

## 2014-07-18 DIAGNOSIS — L821 Other seborrheic keratosis: Secondary | ICD-10-CM | POA: Diagnosis not present

## 2014-09-27 ENCOUNTER — Other Ambulatory Visit: Payer: Self-pay

## 2014-09-27 DIAGNOSIS — Z1231 Encounter for screening mammogram for malignant neoplasm of breast: Secondary | ICD-10-CM

## 2014-11-04 ENCOUNTER — Ambulatory Visit
Admission: RE | Admit: 2014-11-04 | Discharge: 2014-11-04 | Disposition: A | Discharge status: Home / Self Care | Payer: Medicare Other | Source: Ambulatory Visit

## 2014-11-04 DIAGNOSIS — Z1231 Encounter for screening mammogram for malignant neoplasm of breast: Secondary | ICD-10-CM | POA: Diagnosis not present

## 2015-02-06 DIAGNOSIS — R7309 Other abnormal glucose: Secondary | ICD-10-CM | POA: Diagnosis not present

## 2015-02-06 DIAGNOSIS — J45909 Unspecified asthma, uncomplicated: Secondary | ICD-10-CM | POA: Diagnosis not present

## 2015-02-06 DIAGNOSIS — N183 Chronic kidney disease, stage 3 (moderate): Secondary | ICD-10-CM | POA: Diagnosis not present

## 2015-02-06 DIAGNOSIS — Z0000 Encounter for general adult medical examination without abnormal findings: Secondary | ICD-10-CM | POA: Diagnosis not present

## 2015-02-06 DIAGNOSIS — E559 Vitamin D deficiency, unspecified: Secondary | ICD-10-CM | POA: Diagnosis not present

## 2015-02-06 DIAGNOSIS — E7800 Pure hypercholesterolemia, unspecified: Secondary | ICD-10-CM | POA: Diagnosis not present

## 2015-02-06 DIAGNOSIS — I10 Essential (primary) hypertension: Secondary | ICD-10-CM | POA: Diagnosis not present

## 2015-02-06 DIAGNOSIS — J301 Allergic rhinitis due to pollen: Secondary | ICD-10-CM | POA: Diagnosis not present

## 2015-02-06 DIAGNOSIS — Z23 Encounter for immunization: Secondary | ICD-10-CM | POA: Diagnosis not present

## 2015-02-06 DIAGNOSIS — E039 Hypothyroidism, unspecified: Secondary | ICD-10-CM | POA: Diagnosis not present

## 2015-02-06 DIAGNOSIS — R0683 Snoring: Secondary | ICD-10-CM | POA: Diagnosis not present

## 2015-03-07 DIAGNOSIS — H02831 Dermatochalasis of right upper eyelid: Secondary | ICD-10-CM | POA: Diagnosis not present

## 2015-03-07 DIAGNOSIS — H02834 Dermatochalasis of left upper eyelid: Secondary | ICD-10-CM | POA: Diagnosis not present

## 2015-03-07 DIAGNOSIS — Z961 Presence of intraocular lens: Secondary | ICD-10-CM | POA: Diagnosis not present

## 2015-03-07 DIAGNOSIS — H26492 Other secondary cataract, left eye: Secondary | ICD-10-CM | POA: Diagnosis not present

## 2015-03-07 DIAGNOSIS — H2511 Age-related nuclear cataract, right eye: Secondary | ICD-10-CM | POA: Diagnosis not present

## 2015-03-14 DIAGNOSIS — H2511 Age-related nuclear cataract, right eye: Secondary | ICD-10-CM | POA: Diagnosis not present

## 2015-03-20 DIAGNOSIS — H269 Unspecified cataract: Secondary | ICD-10-CM | POA: Diagnosis not present

## 2015-03-20 DIAGNOSIS — H2511 Age-related nuclear cataract, right eye: Secondary | ICD-10-CM | POA: Diagnosis not present

## 2015-04-27 DIAGNOSIS — G4733 Obstructive sleep apnea (adult) (pediatric): Secondary | ICD-10-CM | POA: Diagnosis not present

## 2015-05-31 DIAGNOSIS — G4733 Obstructive sleep apnea (adult) (pediatric): Secondary | ICD-10-CM | POA: Diagnosis not present

## 2015-06-23 DIAGNOSIS — L814 Other melanin hyperpigmentation: Secondary | ICD-10-CM | POA: Diagnosis not present

## 2015-06-23 DIAGNOSIS — L918 Other hypertrophic disorders of the skin: Secondary | ICD-10-CM | POA: Diagnosis not present

## 2015-06-23 DIAGNOSIS — L821 Other seborrheic keratosis: Secondary | ICD-10-CM | POA: Diagnosis not present

## 2015-06-23 DIAGNOSIS — D1801 Hemangioma of skin and subcutaneous tissue: Secondary | ICD-10-CM | POA: Diagnosis not present

## 2015-06-23 DIAGNOSIS — L718 Other rosacea: Secondary | ICD-10-CM | POA: Diagnosis not present

## 2015-06-23 DIAGNOSIS — L820 Inflamed seborrheic keratosis: Secondary | ICD-10-CM | POA: Diagnosis not present

## 2015-06-23 DIAGNOSIS — D2372 Other benign neoplasm of skin of left lower limb, including hip: Secondary | ICD-10-CM | POA: Diagnosis not present

## 2015-08-09 DIAGNOSIS — E7800 Pure hypercholesterolemia, unspecified: Secondary | ICD-10-CM | POA: Diagnosis not present

## 2015-08-09 DIAGNOSIS — R7309 Other abnormal glucose: Secondary | ICD-10-CM | POA: Diagnosis not present

## 2015-08-09 DIAGNOSIS — I10 Essential (primary) hypertension: Secondary | ICD-10-CM | POA: Diagnosis not present

## 2015-08-09 DIAGNOSIS — J45909 Unspecified asthma, uncomplicated: Secondary | ICD-10-CM | POA: Diagnosis not present

## 2015-09-06 DIAGNOSIS — G4733 Obstructive sleep apnea (adult) (pediatric): Secondary | ICD-10-CM | POA: Diagnosis not present

## 2015-09-28 DIAGNOSIS — Z79899 Other long term (current) drug therapy: Secondary | ICD-10-CM | POA: Diagnosis not present

## 2015-09-28 DIAGNOSIS — K2270 Barrett's esophagus without dysplasia: Secondary | ICD-10-CM | POA: Diagnosis not present

## 2015-09-28 DIAGNOSIS — K219 Gastro-esophageal reflux disease without esophagitis: Secondary | ICD-10-CM | POA: Diagnosis not present

## 2015-09-28 DIAGNOSIS — Z86010 Personal history of colonic polyps: Secondary | ICD-10-CM | POA: Diagnosis not present

## 2015-10-23 ENCOUNTER — Other Ambulatory Visit: Payer: Self-pay | Admitting: Family Medicine

## 2015-10-23 DIAGNOSIS — Z1231 Encounter for screening mammogram for malignant neoplasm of breast: Secondary | ICD-10-CM

## 2015-10-24 DIAGNOSIS — R718 Other abnormality of red blood cells: Secondary | ICD-10-CM | POA: Diagnosis not present

## 2015-11-10 ENCOUNTER — Ambulatory Visit
Admission: RE | Admit: 2015-11-10 | Discharge: 2015-11-10 | Disposition: A | Discharge status: Home / Self Care | Payer: Medicare Other | Source: Ambulatory Visit | Attending: Family Medicine | Admitting: Family Medicine

## 2015-11-10 DIAGNOSIS — Z1231 Encounter for screening mammogram for malignant neoplasm of breast: Secondary | ICD-10-CM | POA: Diagnosis not present

## 2015-12-02 DIAGNOSIS — M79674 Pain in right toe(s): Secondary | ICD-10-CM | POA: Diagnosis not present

## 2015-12-20 DIAGNOSIS — I10 Essential (primary) hypertension: Secondary | ICD-10-CM | POA: Diagnosis not present

## 2015-12-20 DIAGNOSIS — J301 Allergic rhinitis due to pollen: Secondary | ICD-10-CM | POA: Diagnosis not present

## 2015-12-20 DIAGNOSIS — M10271 Drug-induced gout, right ankle and foot: Secondary | ICD-10-CM | POA: Diagnosis not present

## 2016-01-03 DIAGNOSIS — Z23 Encounter for immunization: Secondary | ICD-10-CM | POA: Diagnosis not present

## 2016-01-25 DIAGNOSIS — E611 Iron deficiency: Secondary | ICD-10-CM | POA: Diagnosis not present

## 2016-01-25 DIAGNOSIS — I10 Essential (primary) hypertension: Secondary | ICD-10-CM | POA: Diagnosis not present

## 2016-01-25 DIAGNOSIS — M109 Gout, unspecified: Secondary | ICD-10-CM | POA: Diagnosis not present

## 2016-02-05 DIAGNOSIS — J209 Acute bronchitis, unspecified: Secondary | ICD-10-CM | POA: Diagnosis not present

## 2016-02-21 DIAGNOSIS — E7800 Pure hypercholesterolemia, unspecified: Secondary | ICD-10-CM | POA: Diagnosis not present

## 2016-02-21 DIAGNOSIS — I10 Essential (primary) hypertension: Secondary | ICD-10-CM | POA: Diagnosis not present

## 2016-02-21 DIAGNOSIS — E039 Hypothyroidism, unspecified: Secondary | ICD-10-CM | POA: Diagnosis not present

## 2016-02-21 DIAGNOSIS — R718 Other abnormality of red blood cells: Secondary | ICD-10-CM | POA: Diagnosis not present

## 2016-02-21 DIAGNOSIS — M5430 Sciatica, unspecified side: Secondary | ICD-10-CM | POA: Diagnosis not present

## 2016-02-21 DIAGNOSIS — R7309 Other abnormal glucose: Secondary | ICD-10-CM | POA: Diagnosis not present

## 2016-02-21 DIAGNOSIS — Z0000 Encounter for general adult medical examination without abnormal findings: Secondary | ICD-10-CM | POA: Diagnosis not present

## 2016-02-21 DIAGNOSIS — E559 Vitamin D deficiency, unspecified: Secondary | ICD-10-CM | POA: Diagnosis not present

## 2016-03-15 DIAGNOSIS — H6692 Otitis media, unspecified, left ear: Secondary | ICD-10-CM | POA: Diagnosis not present

## 2016-03-15 DIAGNOSIS — J069 Acute upper respiratory infection, unspecified: Secondary | ICD-10-CM | POA: Diagnosis not present

## 2016-03-25 DIAGNOSIS — D72829 Elevated white blood cell count, unspecified: Secondary | ICD-10-CM | POA: Diagnosis not present

## 2016-03-26 DIAGNOSIS — H6523 Chronic serous otitis media, bilateral: Secondary | ICD-10-CM | POA: Diagnosis not present

## 2016-03-26 DIAGNOSIS — H906 Mixed conductive and sensorineural hearing loss, bilateral: Secondary | ICD-10-CM | POA: Diagnosis not present

## 2016-03-26 DIAGNOSIS — H90A12 Conductive hearing loss, unilateral, left ear with restricted hearing on the contralateral side: Secondary | ICD-10-CM | POA: Diagnosis not present

## 2016-03-26 DIAGNOSIS — H90A31 Mixed conductive and sensorineural hearing loss, unilateral, right ear with restricted hearing on the contralateral side: Secondary | ICD-10-CM | POA: Diagnosis not present

## 2016-04-10 ENCOUNTER — Encounter (HOSPITAL_COMMUNITY): Payer: Self-pay

## 2016-04-22 DIAGNOSIS — G8929 Other chronic pain: Secondary | ICD-10-CM | POA: Diagnosis not present

## 2016-04-22 DIAGNOSIS — M25562 Pain in left knee: Secondary | ICD-10-CM | POA: Diagnosis not present

## 2016-04-22 DIAGNOSIS — M25561 Pain in right knee: Secondary | ICD-10-CM | POA: Diagnosis not present

## 2016-04-22 DIAGNOSIS — M25512 Pain in left shoulder: Secondary | ICD-10-CM | POA: Diagnosis not present

## 2016-04-25 DIAGNOSIS — H6983 Other specified disorders of Eustachian tube, bilateral: Secondary | ICD-10-CM | POA: Diagnosis not present

## 2016-04-25 DIAGNOSIS — I10 Essential (primary) hypertension: Secondary | ICD-10-CM | POA: Diagnosis not present

## 2016-04-25 DIAGNOSIS — J45909 Unspecified asthma, uncomplicated: Secondary | ICD-10-CM | POA: Diagnosis not present

## 2016-04-25 DIAGNOSIS — Z6841 Body Mass Index (BMI) 40.0 and over, adult: Secondary | ICD-10-CM | POA: Diagnosis not present

## 2016-04-26 DIAGNOSIS — M25512 Pain in left shoulder: Secondary | ICD-10-CM | POA: Diagnosis not present

## 2016-04-30 DIAGNOSIS — M25512 Pain in left shoulder: Secondary | ICD-10-CM | POA: Diagnosis not present

## 2016-05-03 DIAGNOSIS — M25512 Pain in left shoulder: Secondary | ICD-10-CM | POA: Diagnosis not present

## 2016-05-06 DIAGNOSIS — M25512 Pain in left shoulder: Secondary | ICD-10-CM | POA: Diagnosis not present

## 2016-05-06 DIAGNOSIS — M170 Bilateral primary osteoarthritis of knee: Secondary | ICD-10-CM | POA: Diagnosis not present

## 2016-05-07 DIAGNOSIS — M25512 Pain in left shoulder: Secondary | ICD-10-CM | POA: Diagnosis not present

## 2016-05-14 DIAGNOSIS — M25512 Pain in left shoulder: Secondary | ICD-10-CM | POA: Diagnosis not present

## 2016-05-16 DIAGNOSIS — M25512 Pain in left shoulder: Secondary | ICD-10-CM | POA: Diagnosis not present

## 2016-05-21 DIAGNOSIS — M25512 Pain in left shoulder: Secondary | ICD-10-CM | POA: Diagnosis not present

## 2016-05-22 DIAGNOSIS — H6123 Impacted cerumen, bilateral: Secondary | ICD-10-CM | POA: Diagnosis not present

## 2016-05-23 DIAGNOSIS — M25512 Pain in left shoulder: Secondary | ICD-10-CM | POA: Diagnosis not present

## 2016-05-27 DIAGNOSIS — M25512 Pain in left shoulder: Secondary | ICD-10-CM | POA: Diagnosis not present

## 2016-05-31 DIAGNOSIS — M25512 Pain in left shoulder: Secondary | ICD-10-CM | POA: Diagnosis not present

## 2016-06-03 DIAGNOSIS — M25512 Pain in left shoulder: Secondary | ICD-10-CM | POA: Diagnosis not present

## 2016-06-06 DIAGNOSIS — M25512 Pain in left shoulder: Secondary | ICD-10-CM | POA: Diagnosis not present

## 2016-06-12 DIAGNOSIS — M25512 Pain in left shoulder: Secondary | ICD-10-CM | POA: Diagnosis not present

## 2016-06-17 DIAGNOSIS — M170 Bilateral primary osteoarthritis of knee: Secondary | ICD-10-CM | POA: Diagnosis not present

## 2016-06-24 DIAGNOSIS — M170 Bilateral primary osteoarthritis of knee: Secondary | ICD-10-CM | POA: Diagnosis not present

## 2016-07-01 DIAGNOSIS — M170 Bilateral primary osteoarthritis of knee: Secondary | ICD-10-CM | POA: Diagnosis not present

## 2016-07-10 DIAGNOSIS — H04123 Dry eye syndrome of bilateral lacrimal glands: Secondary | ICD-10-CM | POA: Diagnosis not present

## 2016-07-10 DIAGNOSIS — H02831 Dermatochalasis of right upper eyelid: Secondary | ICD-10-CM | POA: Diagnosis not present

## 2016-07-10 DIAGNOSIS — H02834 Dermatochalasis of left upper eyelid: Secondary | ICD-10-CM | POA: Diagnosis not present

## 2016-07-10 DIAGNOSIS — H16143 Punctate keratitis, bilateral: Secondary | ICD-10-CM | POA: Diagnosis not present

## 2016-07-10 DIAGNOSIS — H01025 Squamous blepharitis left lower eyelid: Secondary | ICD-10-CM | POA: Diagnosis not present

## 2016-07-10 DIAGNOSIS — H01024 Squamous blepharitis left upper eyelid: Secondary | ICD-10-CM | POA: Diagnosis not present

## 2016-07-10 DIAGNOSIS — Z961 Presence of intraocular lens: Secondary | ICD-10-CM | POA: Diagnosis not present

## 2016-07-10 DIAGNOSIS — H01021 Squamous blepharitis right upper eyelid: Secondary | ICD-10-CM | POA: Diagnosis not present

## 2016-07-10 DIAGNOSIS — H01022 Squamous blepharitis right lower eyelid: Secondary | ICD-10-CM | POA: Diagnosis not present

## 2016-07-10 DIAGNOSIS — H26492 Other secondary cataract, left eye: Secondary | ICD-10-CM | POA: Diagnosis not present

## 2016-07-30 ENCOUNTER — Emergency Department (HOSPITAL_COMMUNITY): Payer: Medicare Other

## 2016-07-30 ENCOUNTER — Encounter (HOSPITAL_COMMUNITY): Payer: Self-pay | Admitting: Emergency Medicine

## 2016-07-30 ENCOUNTER — Inpatient Hospital Stay (HOSPITAL_COMMUNITY)
Admission: EM | Admit: 2016-07-30 | Discharge: 2016-08-02 | DRG: 309 | Disposition: A | Discharge status: Home / Self Care | Payer: Medicare Other | Attending: Cardiovascular Disease | Admitting: Cardiovascular Disease

## 2016-07-30 DIAGNOSIS — E039 Hypothyroidism, unspecified: Secondary | ICD-10-CM | POA: Diagnosis present

## 2016-07-30 DIAGNOSIS — I480 Paroxysmal atrial fibrillation: Secondary | ICD-10-CM | POA: Diagnosis not present

## 2016-07-30 DIAGNOSIS — Z9884 Bariatric surgery status: Secondary | ICD-10-CM

## 2016-07-30 DIAGNOSIS — E876 Hypokalemia: Secondary | ICD-10-CM | POA: Diagnosis present

## 2016-07-30 DIAGNOSIS — I4891 Unspecified atrial fibrillation: Secondary | ICD-10-CM

## 2016-07-30 DIAGNOSIS — E785 Hyperlipidemia, unspecified: Secondary | ICD-10-CM | POA: Diagnosis present

## 2016-07-30 DIAGNOSIS — I371 Nonrheumatic pulmonary valve insufficiency: Secondary | ICD-10-CM | POA: Diagnosis present

## 2016-07-30 DIAGNOSIS — I129 Hypertensive chronic kidney disease with stage 1 through stage 4 chronic kidney disease, or unspecified chronic kidney disease: Secondary | ICD-10-CM | POA: Diagnosis present

## 2016-07-30 DIAGNOSIS — I10 Essential (primary) hypertension: Secondary | ICD-10-CM

## 2016-07-30 DIAGNOSIS — R0602 Shortness of breath: Secondary | ICD-10-CM | POA: Diagnosis not present

## 2016-07-30 DIAGNOSIS — Z882 Allergy status to sulfonamides status: Secondary | ICD-10-CM

## 2016-07-30 DIAGNOSIS — E559 Vitamin D deficiency, unspecified: Secondary | ICD-10-CM | POA: Diagnosis present

## 2016-07-30 DIAGNOSIS — Z6841 Body Mass Index (BMI) 40.0 and over, adult: Secondary | ICD-10-CM

## 2016-07-30 DIAGNOSIS — N183 Chronic kidney disease, stage 3 (moderate): Secondary | ICD-10-CM | POA: Diagnosis present

## 2016-07-30 DIAGNOSIS — J45909 Unspecified asthma, uncomplicated: Secondary | ICD-10-CM | POA: Diagnosis present

## 2016-07-30 DIAGNOSIS — I4819 Other persistent atrial fibrillation: Secondary | ICD-10-CM

## 2016-07-30 DIAGNOSIS — Z885 Allergy status to narcotic agent status: Secondary | ICD-10-CM

## 2016-07-30 DIAGNOSIS — Z87891 Personal history of nicotine dependence: Secondary | ICD-10-CM

## 2016-07-30 DIAGNOSIS — Z825 Family history of asthma and other chronic lower respiratory diseases: Secondary | ICD-10-CM

## 2016-07-30 DIAGNOSIS — E7800 Pure hypercholesterolemia, unspecified: Secondary | ICD-10-CM | POA: Diagnosis present

## 2016-07-30 DIAGNOSIS — K219 Gastro-esophageal reflux disease without esophagitis: Secondary | ICD-10-CM | POA: Diagnosis not present

## 2016-07-30 DIAGNOSIS — R000 Tachycardia, unspecified: Secondary | ICD-10-CM | POA: Diagnosis not present

## 2016-07-30 DIAGNOSIS — Z8249 Family history of ischemic heart disease and other diseases of the circulatory system: Secondary | ICD-10-CM

## 2016-07-30 DIAGNOSIS — E662 Morbid (severe) obesity with alveolar hypoventilation: Secondary | ICD-10-CM | POA: Diagnosis present

## 2016-07-30 DIAGNOSIS — Z79899 Other long term (current) drug therapy: Secondary | ICD-10-CM

## 2016-07-30 DIAGNOSIS — Z90710 Acquired absence of both cervix and uterus: Secondary | ICD-10-CM

## 2016-07-30 DIAGNOSIS — I340 Nonrheumatic mitral (valve) insufficiency: Secondary | ICD-10-CM | POA: Diagnosis present

## 2016-07-30 DIAGNOSIS — Z7901 Long term (current) use of anticoagulants: Secondary | ICD-10-CM

## 2016-07-30 DIAGNOSIS — Z7951 Long term (current) use of inhaled steroids: Secondary | ICD-10-CM

## 2016-07-30 DIAGNOSIS — Z881 Allergy status to other antibiotic agents status: Secondary | ICD-10-CM

## 2016-07-30 DIAGNOSIS — I080 Rheumatic disorders of both mitral and aortic valves: Secondary | ICD-10-CM | POA: Diagnosis present

## 2016-07-30 DIAGNOSIS — I351 Nonrheumatic aortic (valve) insufficiency: Secondary | ICD-10-CM | POA: Diagnosis present

## 2016-07-30 HISTORY — DX: Other specified postprocedural states: Z98.890

## 2016-07-30 HISTORY — DX: Dependence on other enabling machines and devices: Z99.89

## 2016-07-30 HISTORY — DX: Hypothyroidism, unspecified: E03.9

## 2016-07-30 HISTORY — DX: Bursitis of left shoulder: M75.52

## 2016-07-30 HISTORY — DX: Obstructive sleep apnea (adult) (pediatric): G47.33

## 2016-07-30 HISTORY — DX: Unspecified chronic bronchitis: J42

## 2016-07-30 HISTORY — DX: Unspecified osteoarthritis, unspecified site: M19.90

## 2016-07-30 HISTORY — DX: Personal history of other diseases of the digestive system: Z87.19

## 2016-07-30 HISTORY — DX: Nausea with vomiting, unspecified: R11.2

## 2016-07-30 LAB — CBC
HEMATOCRIT: 44.8 % (ref 36.0–46.0)
Hemoglobin: 14.8 g/dL (ref 12.0–15.0)
MCH: 27.5 pg (ref 26.0–34.0)
MCHC: 33 g/dL (ref 30.0–36.0)
MCV: 83.3 fL (ref 78.0–100.0)
Platelets: 266 10*3/uL (ref 150–400)
RBC: 5.38 MIL/uL — AB (ref 3.87–5.11)
RDW: 15 % (ref 11.5–15.5)
WBC: 13.8 10*3/uL — AB (ref 4.0–10.5)

## 2016-07-30 LAB — BASIC METABOLIC PANEL
Anion gap: 13 (ref 5–15)
BUN: 15 mg/dL (ref 6–20)
CO2: 21 mmol/L — ABNORMAL LOW (ref 22–32)
Calcium: 8.8 mg/dL — ABNORMAL LOW (ref 8.9–10.3)
Chloride: 106 mmol/L (ref 101–111)
Creatinine, Ser: 0.93 mg/dL (ref 0.44–1.00)
Glucose, Bld: 101 mg/dL — ABNORMAL HIGH (ref 65–99)
POTASSIUM: 3.7 mmol/L (ref 3.5–5.1)
SODIUM: 140 mmol/L (ref 135–145)

## 2016-07-30 LAB — TSH: TSH: 2.513 u[IU]/mL (ref 0.350–4.500)

## 2016-07-30 LAB — MAGNESIUM: MAGNESIUM: 1.3 mg/dL — AB (ref 1.7–2.4)

## 2016-07-30 MED ORDER — FLUTICASONE PROPIONATE 50 MCG/ACT NA SUSP
2.0000 | Freq: Every day | NASAL | Status: DC
  Administered 2016-07-31 – 2016-08-02 (×3): 2 via NASAL
  Filled 2016-07-30: qty 16

## 2016-07-30 MED ORDER — PANTOPRAZOLE SODIUM 40 MG PO TBEC
40.0000 mg | DELAYED_RELEASE_TABLET | Freq: Every day | ORAL | Status: DC
  Administered 2016-07-31 – 2016-08-02 (×3): 40 mg via ORAL
  Filled 2016-07-30 (×4): qty 1

## 2016-07-30 MED ORDER — ONDANSETRON HCL 4 MG/2ML IJ SOLN: 4.0000 mg | Freq: Four times a day (QID) | INTRAMUSCULAR | Status: DC | PRN

## 2016-07-30 MED ORDER — LORATADINE 10 MG PO TABS
10.0000 mg | ORAL_TABLET | Freq: Every day | ORAL | Status: DC
  Administered 2016-07-31 – 2016-08-02 (×3): 10 mg via ORAL
  Filled 2016-07-30 (×3): qty 1

## 2016-07-30 MED ORDER — MAGNESIUM OXIDE 400 (241.3 MG) MG PO TABS
400.0000 mg | ORAL_TABLET | Freq: Every day | ORAL | Status: DC
  Administered 2016-07-31 – 2016-08-02 (×3): 400 mg via ORAL
  Filled 2016-07-30 (×3): qty 1

## 2016-07-30 MED ORDER — ATORVASTATIN CALCIUM 10 MG PO TABS
10.0000 mg | ORAL_TABLET | Freq: Every day | ORAL | Status: DC
  Administered 2016-07-31 – 2016-08-02 (×3): 10 mg via ORAL
  Filled 2016-07-30 (×3): qty 1

## 2016-07-30 MED ORDER — DILTIAZEM HCL 25 MG/5ML IV SOLN
15.0000 mg | Freq: Once | INTRAVENOUS | Status: AC
  Administered 2016-07-30: 15 mg via INTRAVENOUS
  Filled 2016-07-30: qty 5

## 2016-07-30 MED ORDER — VERAPAMIL HCL ER 180 MG PO TBCR
180.0000 mg | EXTENDED_RELEASE_TABLET | Freq: Once | ORAL | Status: AC
  Administered 2016-07-30: 180 mg via ORAL
  Filled 2016-07-30 (×2): qty 1

## 2016-07-30 MED ORDER — APIXABAN 5 MG PO TABS
5.0000 mg | ORAL_TABLET | Freq: Two times a day (BID) | ORAL | Status: DC
  Administered 2016-07-30 – 2016-08-02 (×6): 5 mg via ORAL
  Filled 2016-07-30 (×6): qty 1

## 2016-07-30 MED ORDER — MAGNESIUM OXIDE 400 (241.3 MG) MG PO TABS: 400.0000 mg | ORAL_TABLET | Freq: Every day | ORAL | Status: DC

## 2016-07-30 MED ORDER — MAGNESIUM SULFATE 2 GM/50ML IV SOLN
2.0000 g | Freq: Once | INTRAVENOUS | Status: AC
  Administered 2016-07-30: 2 g via INTRAVENOUS
  Filled 2016-07-30: qty 50

## 2016-07-30 MED ORDER — METOPROLOL SUCCINATE ER 50 MG PO TB24
250.0000 mg | ORAL_TABLET | Freq: Every morning | ORAL | Status: DC
  Administered 2016-07-31 – 2016-08-02 (×3): 250 mg via ORAL
  Filled 2016-07-30 (×3): qty 1

## 2016-07-30 MED ORDER — METOPROLOL TARTRATE 5 MG/5ML IV SOLN
10.0000 mg | Freq: Once | INTRAVENOUS | Status: AC
  Administered 2016-07-30: 10 mg via INTRAVENOUS
  Filled 2016-07-30: qty 10

## 2016-07-30 MED ORDER — ACETAMINOPHEN 325 MG PO TABS
650.0000 mg | ORAL_TABLET | ORAL | Status: DC | PRN
  Administered 2016-07-31 – 2016-08-01 (×3): 650 mg via ORAL
  Filled 2016-07-30 (×3): qty 2

## 2016-07-30 MED ORDER — VERAPAMIL HCL ER 180 MG PO TBCR
360.0000 mg | EXTENDED_RELEASE_TABLET | Freq: Every morning | ORAL | Status: DC
  Administered 2016-07-31 – 2016-08-02 (×3): 360 mg via ORAL
  Filled 2016-07-30 (×3): qty 2

## 2016-07-30 MED ORDER — LEVOTHYROXINE SODIUM 50 MCG PO TABS
50.0000 ug | ORAL_TABLET | Freq: Every day | ORAL | Status: DC
  Administered 2016-07-31 – 2016-08-02 (×3): 50 ug via ORAL
  Filled 2016-07-30 (×3): qty 1

## 2016-07-30 MED ORDER — FUROSEMIDE 20 MG PO TABS
20.0000 mg | ORAL_TABLET | Freq: Every morning | ORAL | Status: DC
  Administered 2016-07-31 – 2016-08-02 (×3): 20 mg via ORAL
  Filled 2016-07-30 (×3): qty 1

## 2016-07-30 MED ORDER — IRBESARTAN 150 MG PO TABS
300.0000 mg | ORAL_TABLET | Freq: Every day | ORAL | Status: DC
  Administered 2016-08-02: 300 mg via ORAL
  Filled 2016-07-30 (×3): qty 2

## 2016-07-30 NOTE — ED Provider Notes (Signed)
[facility] DEPT Provider Note   CSN: [id number] Arrival date & time:        History   Chief Complaint Chief Complaint  Patient presents with  . Atrial Fibrillation    HPI Leslie [name] is a 71 y.o. female.  HPI Patient presents the emergency department from the primary care doctor's office today for new onset atrial fibrillation.  No prior history of A. fib.  She states that she developed acute palpitations last night and found that her heart rate was 140.  She went to bed and awoke this morning still feeling an irregular heartbeat.  No chest pressure or chest tightness.  No fevers or chills.  No recent change in medications.  No excessive caffeine intake.  No recent change in her caffeine intake.  Currently asymptomatic without any palpitations yet still in A. fib on the monitor   Past Medical History:  Diagnosis Date  . Adenomatous polyp   . Asthma   . Chronic kidney disease    stage 3  . GERD (gastroesophageal reflux disease)    barrett's esophagus- Dr. Cristina Gong  . Hyperlipidemia   . Hypertension   . Migraines   . Obesity    s/p lap band surgery 6/10  . Vitamin D deficiency     Patient Active Problem List   Diagnosis Date Noted  . Essential hypertension, benign 06/07/2013  . Unspecified vitamin D deficiency 06/07/2013  . Pure hypercholesterolemia 06/07/2013  . Obesity, unspecified 06/07/2013  . Unspecified hypothyroidism 06/07/2013  . Chronic kidney disease, stage III (moderate) 06/07/2013  . Contact dermatitis and other eczema, due to unspecified cause 06/07/2013    Past Surgical History:  Procedure Laterality Date  . APPENDECTOMY     incidental  . COMBINED HYSTERECTOMY VAGINAL / OOPHORECTOMY / A&P REPAIR  1981  . lap band surgery and hiatal hernia repair 6/10    . left inguinal hernia repair  1952  . os cataract surgery 4/08    . TONSILLECTOMY AND ADENOIDECTOMY  1974    OB History    No data available       Home Medications    Prior to  Admission medications   Medication Sig Start Date End Date Taking? Authorizing Provider  acetaminophen (TYLENOL) 500 MG tablet Take 500-1,000 mg by mouth every 6 (six) hours as needed (for pain).   Yes Historical Provider, MD  albuterol (PROVENTIL HFA;VENTOLIN HFA) 108 (90 BASE) MCG/ACT inhaler Inhale 1-2 puffs into the lungs every 6 (six) hours as needed for wheezing or shortness of breath.   Yes Historical Provider, MD  atorvastatin (LIPITOR) 10 MG tablet Take 10 mg by mouth daily.   Yes Historical Provider, MD  Azelaic Acid-Cleanser-Lotion (FINACEA PLUS) 15 % KIT Apply 1 application topically daily.    Yes Historical Provider, MD  cholecalciferol (VITAMIN D) 1000 UNITS tablet Take 1,000 Units by mouth daily.   Yes Historical Provider, MD  fexofenadine (ALLEGRA) 180 MG tablet Take 180 mg by mouth daily.   Yes Historical Provider, MD  fluticasone (FLONASE) 50 MCG/ACT nasal spray Place 2 sprays into both nostrils daily.   Yes Historical Provider, MD  furosemide (LASIX) 20 MG tablet Take 20 mg by mouth every morning. 05/20/16  Yes Historical Provider, MD  Glucosamine-Chondroitin (OSTEO BI-FLEX REGULAR STRENGTH PO) Take 1 tablet by mouth daily.   Yes Historical Provider, MD  levothyroxine (SYNTHROID, LEVOTHROID) 50 MCG tablet Take 50 mcg by mouth daily before breakfast.   Yes Historical Provider, MD  metoprolol succinate (TOPROL-XL) 100  MG 24 hr tablet Take 250 mg by mouth every morning. 07/23/16  Yes Historical Provider, MD  Multiple Vitamins-Minerals (CENTRUM SILVER ADULT 50+ PO) Take 1 tablet by mouth daily.    Yes Historical Provider, MD  omeprazole (PRILOSEC) 20 MG capsule Take 40 mg by mouth daily before breakfast. 07/27/16  Yes Historical Provider, MD  SYMBICORT 160-4.5 MCG/ACT inhaler Inhale 2 puffs into the lungs 2 (two) times daily. 04/30/16  Yes Historical Provider, MD  valsartan (DIOVAN) 320 MG tablet Take 320 mg by mouth daily. 07/29/16  Yes Historical Provider, MD  verapamil (CALAN-SR) 180 MG  CR tablet Take 180 mg by mouth every morning.    Yes Historical Provider, MD  Vitamin D, Ergocalciferol, (DRISDOL) 50000 UNITS CAPS capsule Take 50,000 Units by mouth every 7 (seven) days.   Yes Historical Provider, MD    Family History Family History  Problem Relation Age of Onset  . Heart failure Mother   . Hypertension Mother   . Hypertension Father   . Emphysema Father     Social History Social History  Substance Use Topics  . Smoking status: Former Research scientist (life sciences)  . Smokeless tobacco: Never Used  . Alcohol use Yes     Comment: occassional     Allergies   Augmentin [amoxicillin-pot clavulanate]; Codeine; Demerol [meperidine]; and Sulfa antibiotics   Review of Systems Review of Systems  All other systems reviewed and are negative.    Physical Exam Updated Vital Signs BP 136/78   Pulse (!) 104   Temp 98 F (36.7 C) (Oral)   Resp 18   Ht 5' 6.5" (1.689 m)   Wt 290 lb (131.5 kg)   SpO2 97%   BMI 46.11 kg/m   Physical Exam  Constitutional: She is oriented to person, place, and time. She appears well-developed and well-nourished. No distress.  HENT:  Head: Normocephalic and atraumatic.  Eyes: EOM are normal.  Neck: Normal range of motion.  Cardiovascular: Regular rhythm.   Mild tachycardia.  Irregularly irregular  Pulmonary/Chest: Effort normal and breath sounds normal.  Abdominal: Soft. She exhibits no distension. There is no tenderness.  Musculoskeletal: Normal range of motion.  Neurological: She is alert and oriented to person, place, and time.  Skin: Skin is warm and dry.  Psychiatric: She has a normal mood and affect. Judgment normal.  Nursing note and vitals reviewed.    ED Treatments / Results  Labs (all labs ordered are listed, but only abnormal results are displayed) Labs Reviewed  BASIC METABOLIC PANEL - Abnormal; Notable for the following:       Result Value   CO2 21 (*)    Glucose, Bld 101 (*)    Calcium 8.8 (*)    All other components within  normal limits  CBC - Abnormal; Notable for the following:    WBC 13.8 (*)    RBC 5.38 (*)    All other components within normal limits  MAGNESIUM - Abnormal; Notable for the following:    Magnesium 1.3 (*)    All other components within normal limits  TSH    EKG  EKG Interpretation None       Radiology Dg Chest 2 View  Result Date: 07/30/2016 CLINICAL DATA:  71 year old female with new onset of atrial fibrillation. Shortness of breath and nausea for 1 day. EXAM: CHEST  2 VIEW COMPARISON:  Chest x-ray 01/29/2012. FINDINGS: Lung volumes are normal. No consolidative airspace disease. No pleural effusions. No evidence of pulmonary edema. No pneumothorax. Heart size is  mildly enlarged. Upper mediastinal contours are within normal limits. Aortic atherosclerosis. IMPRESSION: 1. No radiographic evidence of acute cardiopulmonary disease. 2. Mild cardiomegaly. 3. Aortic atherosclerosis. Electronically Signed   By: Vinnie Langton M.D.   On: 07/30/2016 15:05    Procedures Procedures (including critical care time)  Medications Ordered in ED Medications  diltiazem (CARDIZEM) injection 15 mg (not administered)  metoprolol (LOPRESSOR) injection 10 mg (10 mg Intravenous Given 07/30/16 1520)     Initial Impression / Assessment and Plan / ED Course  I have reviewed the triage vital signs and the nursing notes.  Pertinent labs & imaging results that were available during my care of the patient were reviewed by me and considered in my medical decision making (see chart for details).     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 fib.  It's unclear how long she's been in A. fib.  She does have rapid A. fib which was symptomatic last night.  Initially she did not convert with IV Lopressor.  I will add IV Cardizem at this time.  She may be a little ago home on anticoagulation and follow-up in A. fib clinic versus admission to the hospital  for acute management of her A. fib and possible TEE tomorrow with cardioversion if clear.  Final Clinical Impressions(s) / ED Diagnoses   Final diagnoses:  Paroxysmal atrial fibrillation ([place])  Atrial fibrillation with RVR [facility])    New Prescriptions New Prescriptions   No medications on file     Jola Schmidt, MD 07/30/16 1700

## 2016-07-30 NOTE — ED Notes (Signed)
Pt ambulated to restroom, pt had no complaints while ambulating.

## 2016-07-30 NOTE — Consult Note (Signed)
CARDIOLOGY H&P NOTE   Patient ID: Leslie [name] MRN: [id number] DOB/AGE: Aug 08, 1945 71 y.o.  Admit date: 07/30/2016  Requesting Physician: Dr. Venora Maples Primary Physician:   Dr. Darcus Austin Primary Cardiologist: New- Dr. Sallyanne Kuster Reason for Consultation:  New onset afib   HPI: Leslie [name] is a 71 y.o. female with a history of HTN, HLD, morbid obesity s/p lap band surgery, GERD, and asthma who presented to [facility] ED today from PCP office with afib with RVR.   She was sent to cardiology in 2003 by her PCP for general screening. She had an abnormal Cardiolite with subsequent left heart catheterization showing normal LV function and essentially normal coronaries (mild luminal irregularities). She had mildly elevated LVEDP and weight loss was recommended as well as aggressive primary prevention.  She is regularly followed by Dr. Inda Merlin. She was in her usual state of health until last night when she started to notice palpitations and found that her heart rate was 140 by blood pressure cuff. She also noted her blood pressure to be high at 159/140.  She went to bed and awoke this morning still feeling an irregular heartbeat. She made an appointment at PCP office where she was found to be in A. fib with RVR. EMS was called and she was transported to [facility].  She was given IV diltiazem without significant improvement in heart rates. This was then followed by IV Lopressor 10 mg, with improvement of heart rates into 80s and 90s. She is basically asymptomatic unless heart rates are up. No CP. No LE edema, orthopnea or PND. No dizziness or syncope. No blood in stool or urine. She is noted to be on Toprol XL 213m and Verapamil CR 1867mdaily. She said she did have some palpitations (no ECG done) a few months ago and Dr. GaInda Merlinad increased her Toprol XL. She has noticed some worsening exertional SOB recently when going up stairs. She has not been very active recently due to knee pain.      Past Medical History:  Diagnosis Date  . Adenomatous polyp   . Asthma   . Chronic kidney disease    stage 3  . GERD (gastroesophageal reflux disease)    barrett's esophagus- Dr. BuCristina Gong. Hyperlipidemia   . Hypertension   . Migraines   . Obesity    s/p lap band surgery 6/10  . Vitamin D deficiency      Past Surgical History:  Procedure Laterality Date  . APPENDECTOMY     incidental  . COMBINED HYSTERECTOMY VAGINAL / OOPHORECTOMY / A&P REPAIR  1981  . lap band surgery and hiatal hernia repair 6/10    . left inguinal hernia repair  1952  . os cataract surgery 4/08    . TONSILLECTOMY AND ADENOIDECTOMY  1974    Allergies  Allergen Reactions  . Augmentin [Amoxicillin-Pot Clavulanate] Diarrhea  . Codeine Nausea Only and Other (See Comments)    Hyperactivity (also)   . Demerol [Meperidine] Nausea And Vomiting  . Sulfa Antibiotics Rash    I have reviewed the patient's current medications     Prior to Admission medications   Medication Sig Start Date End Date Taking? Authorizing Provider  acetaminophen (TYLENOL) 500 MG tablet Take 500-1,000 mg by mouth every 6 (six) hours as needed (for pain).   Yes Historical Provider, MD  albuterol (PROVENTIL HFA;VENTOLIN HFA) 108 (90 BASE) MCG/ACT inhaler Inhale 1-2 puffs into the lungs every 6 (six) hours as needed  for wheezing or shortness of breath.   Yes Historical Provider, MD  atorvastatin (LIPITOR) 10 MG tablet Take 10 mg by mouth daily.   Yes Historical Provider, MD  Azelaic Acid-Cleanser-Lotion (FINACEA PLUS) 15 % KIT Apply 1 application topically daily.    Yes Historical Provider, MD  cholecalciferol (VITAMIN D) 1000 UNITS tablet Take 1,000 Units by mouth daily.   Yes Historical Provider, MD  fexofenadine (ALLEGRA) 180 MG tablet Take 180 mg by mouth daily.   Yes Historical Provider, MD  fluticasone (FLONASE) 50 MCG/ACT nasal spray Place 2 sprays into both nostrils daily.   Yes Historical Provider, MD  furosemide (LASIX)  20 MG tablet Take 20 mg by mouth every morning. 05/20/16  Yes Historical Provider, MD  Glucosamine-Chondroitin (OSTEO BI-FLEX REGULAR STRENGTH PO) Take 1 tablet by mouth daily.   Yes Historical Provider, MD  levothyroxine (SYNTHROID, LEVOTHROID) 50 MCG tablet Take 50 mcg by mouth daily before breakfast.   Yes Historical Provider, MD  metoprolol succinate (TOPROL-XL) 100 MG 24 hr tablet Take 250 mg by mouth every morning. 07/23/16  Yes Historical Provider, MD  Multiple Vitamins-Minerals (CENTRUM SILVER ADULT 50+ PO) Take 1 tablet by mouth daily.    Yes Historical Provider, MD  omeprazole (PRILOSEC) 20 MG capsule Take 40 mg by mouth daily before breakfast. 07/27/16  Yes Historical Provider, MD  SYMBICORT 160-4.5 MCG/ACT inhaler Inhale 2 puffs into the lungs 2 (two) times daily. 04/30/16  Yes Historical Provider, MD  valsartan (DIOVAN) 320 MG tablet Take 320 mg by mouth daily. 07/29/16  Yes Historical Provider, MD  verapamil (CALAN-SR) 180 MG CR tablet Take 180 mg by mouth every morning.    Yes Historical Provider, MD  Vitamin D, Ergocalciferol, (DRISDOL) 50000 UNITS CAPS capsule Take 50,000 Units by mouth every 7 (seven) days.   Yes Historical Provider, MD     Social History   Social History  . Marital status: Single    Spouse name: N/A  . Number of children: N/A  . Years of education: N/A   Occupational History  . Not on file.   Social History Main Topics  . Smoking status: Former Research scientist (life sciences)  . Smokeless tobacco: Never Used  . Alcohol use Yes     Comment: occassional  . Drug use: No  . Sexual activity: Not on file   Other Topics Concern  . Not on file   Social History Narrative  . No narrative on file    Family Status  Relation Status  . Mother Deceased  . Father Deceased  . Brother Alive   Family History  Problem Relation Age of Onset  . Heart failure Mother   . Hypertension Mother   . Hypertension Father   . Emphysema Father    ROS:  Full 14 point review of systems complete  and found to be negative unless listed above.  Physical Exam: Blood pressure 140/74, pulse 97, temperature 98 F (36.7 C), temperature source Oral, resp. rate 14, height 5' 6.5" (1.689 m), weight 290 lb (131.5 kg), SpO2 98 %.  General: Well developed, well nourished, female in no acute distress, obese Head: Eyes PERRLA, No xanthomas.   Normocephalic and atraumatic, oropharynx without edema or exudate.   Lungs: CTAB Heart: irreg irreg, S1 S2, no rub/gallop, Heart irregular rate and rhythm with S1, S2  No murmur. pulses are 2+ extrem.   Neck: No carotid bruits. No lymphadenopathy.  No JVD. Abdomen: Bowel sounds present, abdomen soft and non-tender without masses or hernias noted. Msk:  No spine or cva tenderness. No weakness, no joint deformities or effusions. Extremities: No clubbing or cyanosis. No LE  edema.  Neuro: Alert and oriented X 3. No focal deficits noted. Psych:  Good affect, responds appropriately Skin: No rashes or lesions noted.  Labs:   Lab Results  Component Value Date   WBC 13.8 (H) 07/30/2016   HGB 14.8 07/30/2016   HCT 44.8 07/30/2016   MCV 83.3 07/30/2016   PLT 266 07/30/2016   No results for input(s): INR in the last 72 hours.   Recent Labs Lab 07/30/16 1355  NA 140  K 3.7  CL 106  CO2 21*  BUN 15  CREATININE 0.93  CALCIUM 8.8*  GLUCOSE 101*   Magnesium  Date Value Ref Range Status  07/30/2016 1.3 (L) 1.7 - 2.4 mg/dL Final   No results for input(s): CKTOTAL, CKMB, TROPONINI in the last 72 hours. No results for input(s): TROPIPOC in the last 72 hours. No results found for: PROBNP No results found for: CHOL, HDL, LDLCALC, TRIG No results found for: DDIMER No results found for: LIPASE, AMYLASE TSH  Date/Time Value Ref Range Status  07/30/2016 03:30 PM 2.513 0.350 - 4.500 uIU/mL Final    Comment:    Performed by a 3rd Generation assay with a functional sensitivity of <=0.01 uIU/mL.   No results found for: VITAMINB12, FOLATE, FERRITIN, TIBC,  IRON, RETICCTPCT  Echo: none  Cath 2003  OVERALL IMPRESSION:  1. Normal left ventricular function. EF 60%.  2. Essentially normal coronary arteries with minimal irregularities.   DISCUSSION:  It is felt that the patient will need to modify cardiovascular  risk factors at this point in time.  Her end-diastolic pressure is slightly  elevated, and we will discuss this finding with her and encourage  modification of risk factors and, in particular, weight loss.   ECG:  afib with RVR - personally reviewed  TELE: afib with RVR, now rate controlled with HR in 90s - personally reviewed  Radiology:  Dg Chest 2 View  Result Date: 07/30/2016 CLINICAL DATA:  71 year old female with new onset of atrial fibrillation. Shortness of breath and nausea for 1 day. EXAM: CHEST  2 VIEW COMPARISON:  Chest x-ray 01/29/2012. FINDINGS: Lung volumes are normal. No consolidative airspace disease. No pleural effusions. No evidence of pulmonary edema. No pneumothorax. Heart size is mildly enlarged. Upper mediastinal contours are within normal limits. Aortic atherosclerosis. IMPRESSION: 1. No radiographic evidence of acute cardiopulmonary disease. 2. Mild cardiomegaly. 3. Aortic atherosclerosis. Electronically Signed   By: Vinnie Langton M.D.   On: 07/30/2016 15:05    ASSESSMENT AND PLAN:    Principal Problem:   Atrial fibrillation with rapid ventricular response (HCC) Active Problems:   Essential hypertension, benign   Pure hypercholesterolemia   Obesity, unspecified   Hypothyroidism  Leslie [name] is a 71 y.o. female with a history of HTN, HLD, morbid obesity s/p lap band surgery, GERD, OSA on CPAP and asthma who presented to [facility] ED today from PCP office with afib with RVR.   Afib with RVR: rate now better controlled after IV dilt and IV metoprolol. Already on a significant amount of AV nodal blocking agents at home: Toprol XL '250mg'$  and Verapamil CR '180mg'$  daily. Will increase Verapamil to '360mg'$  daily.  She has already taken her AM meds so will give her '180mg'$  now. Will continue home Toprol XL '250mg'$  daily. CHADSVASC score of at least 2 (HTN, age, F sex). Will start Xarelto and order 2D ECHO.  If HR remains normal overnight, likely discharge home tomorrow.   HTN: BP now well controlled   Hypothyroidism: TSH normal. Continue synthroid.  HLD: continue statin.   OSA on CPAP: she would like to bring in her own CPAP  Asthma: she is on Symbicort and albuterol, which can worsen afib. Discussed not overusing these.   Signed: Angelena Form, PA-C 07/30/2016 6:45 PM  Pager [phone]  I have seen and examined the patient along with Angelena Form, PA-C.  I have reviewed the chart, notes and new data.  I agree with PA's note.  Key new complaints: rapid palpitations, not much else in the way of CV complaints. Already on a lot of beta blocker and moderate dose verapamil. Has a history of Barrett's esophagus, but no bleeding. On CPAP for OSA and on beta-agonist bronchodilators for asthma. Key examination changes: irregular rhythm, ventricular rate around 100 at rest, obesity, otherwise normal exam Key new findings / data: TSH normal, minimal increase in WBC, ECG with AF/RVR, no ischemic changes. Mg 1.3.  PLAN: Increase verapamil. Avoid excessive use of beta-agonists, especially LABA. Replace Mg. Due to the very high doses of negative chronotrope agents she takes and her age, need to monitor ventricular rate response at least overnight. Start DOAC -discussed pros and cons. Prefer Eliquis due to possible lower GI bleed risk than Xarelto, in view of esophagitis history. Urgent cardioversion and antiarrhythmics do not appear indicated.  Sanda Klein, MD, [place] [phone] 07/30/2016, 7:40 PM

## 2016-07-30 NOTE — ED Notes (Signed)
Admitting MD in to assess pt at this time 

## 2016-07-30 NOTE — ED Triage Notes (Signed)
Pt arrives from PCP via [facility] reporting new onset a fib.  Pt reports palpitations yesterday, denies SOB, CP, weakness.  Resp e/u, NAD noted at this time.

## 2016-07-30 NOTE — ED Notes (Signed)
Patient transported to X-ray 

## 2016-07-31 ENCOUNTER — Observation Stay (HOSPITAL_BASED_OUTPATIENT_CLINIC_OR_DEPARTMENT_OTHER): Payer: Medicare Other

## 2016-07-31 DIAGNOSIS — I10 Essential (primary) hypertension: Secondary | ICD-10-CM | POA: Diagnosis not present

## 2016-07-31 DIAGNOSIS — E7800 Pure hypercholesterolemia, unspecified: Secondary | ICD-10-CM | POA: Diagnosis present

## 2016-07-31 DIAGNOSIS — I129 Hypertensive chronic kidney disease with stage 1 through stage 4 chronic kidney disease, or unspecified chronic kidney disease: Secondary | ICD-10-CM | POA: Diagnosis present

## 2016-07-31 DIAGNOSIS — Z87891 Personal history of nicotine dependence: Secondary | ICD-10-CM | POA: Diagnosis not present

## 2016-07-31 DIAGNOSIS — K219 Gastro-esophageal reflux disease without esophagitis: Secondary | ICD-10-CM | POA: Diagnosis present

## 2016-07-31 DIAGNOSIS — I4891 Unspecified atrial fibrillation: Secondary | ICD-10-CM

## 2016-07-31 DIAGNOSIS — E876 Hypokalemia: Secondary | ICD-10-CM | POA: Diagnosis present

## 2016-07-31 DIAGNOSIS — I351 Nonrheumatic aortic (valve) insufficiency: Secondary | ICD-10-CM | POA: Diagnosis not present

## 2016-07-31 DIAGNOSIS — I480 Paroxysmal atrial fibrillation: Secondary | ICD-10-CM | POA: Diagnosis present

## 2016-07-31 DIAGNOSIS — N183 Chronic kidney disease, stage 3 (moderate): Secondary | ICD-10-CM | POA: Diagnosis present

## 2016-07-31 DIAGNOSIS — Z7951 Long term (current) use of inhaled steroids: Secondary | ICD-10-CM | POA: Diagnosis not present

## 2016-07-31 DIAGNOSIS — Z9884 Bariatric surgery status: Secondary | ICD-10-CM | POA: Diagnosis not present

## 2016-07-31 DIAGNOSIS — E559 Vitamin D deficiency, unspecified: Secondary | ICD-10-CM | POA: Diagnosis present

## 2016-07-31 DIAGNOSIS — I371 Nonrheumatic pulmonary valve insufficiency: Secondary | ICD-10-CM | POA: Diagnosis present

## 2016-07-31 DIAGNOSIS — I340 Nonrheumatic mitral (valve) insufficiency: Secondary | ICD-10-CM | POA: Diagnosis not present

## 2016-07-31 DIAGNOSIS — Z881 Allergy status to other antibiotic agents status: Secondary | ICD-10-CM | POA: Diagnosis not present

## 2016-07-31 DIAGNOSIS — E785 Hyperlipidemia, unspecified: Secondary | ICD-10-CM | POA: Diagnosis present

## 2016-07-31 DIAGNOSIS — Z6841 Body Mass Index (BMI) 40.0 and over, adult: Secondary | ICD-10-CM | POA: Diagnosis not present

## 2016-07-31 DIAGNOSIS — Z79899 Other long term (current) drug therapy: Secondary | ICD-10-CM | POA: Diagnosis not present

## 2016-07-31 DIAGNOSIS — Z8249 Family history of ischemic heart disease and other diseases of the circulatory system: Secondary | ICD-10-CM | POA: Diagnosis not present

## 2016-07-31 DIAGNOSIS — Z825 Family history of asthma and other chronic lower respiratory diseases: Secondary | ICD-10-CM | POA: Diagnosis not present

## 2016-07-31 DIAGNOSIS — Z90710 Acquired absence of both cervix and uterus: Secondary | ICD-10-CM | POA: Diagnosis not present

## 2016-07-31 DIAGNOSIS — E039 Hypothyroidism, unspecified: Secondary | ICD-10-CM | POA: Diagnosis present

## 2016-07-31 DIAGNOSIS — J45909 Unspecified asthma, uncomplicated: Secondary | ICD-10-CM | POA: Diagnosis present

## 2016-07-31 DIAGNOSIS — I080 Rheumatic disorders of both mitral and aortic valves: Secondary | ICD-10-CM | POA: Diagnosis present

## 2016-07-31 DIAGNOSIS — E662 Morbid (severe) obesity with alveolar hypoventilation: Secondary | ICD-10-CM | POA: Diagnosis present

## 2016-07-31 LAB — LIPID PANEL
CHOL/HDL RATIO: 4.6 ratio
Cholesterol: 146 mg/dL (ref 0–200)
HDL: 32 mg/dL — ABNORMAL LOW (ref >40)
LDL Cholesterol: 79 mg/dL (ref 0–99)
TRIGLYCERIDES: 174 mg/dL — AB (ref <150)
VLDL: 35 mg/dL (ref 0–40)

## 2016-07-31 LAB — ECHOCARDIOGRAM COMPLETE
HEIGHTINCHES: 66.5 in
WEIGHTICAEL: 4657.6 [oz_av]

## 2016-07-31 LAB — BASIC METABOLIC PANEL
Anion gap: 9 (ref 5–15)
BUN: 11 mg/dL (ref 6–20)
CALCIUM: 8.3 mg/dL — AB (ref 8.9–10.3)
CO2: 26 mmol/L (ref 22–32)
CREATININE: 1.04 mg/dL — AB (ref 0.44–1.00)
Chloride: 105 mmol/L (ref 101–111)
GFR calc Af Amer: >60 mL/min (ref >60)
GFR calc non Af Amer: 53 mL/min — ABNORMAL LOW (ref >60)
GLUCOSE: 109 mg/dL — AB (ref 65–99)
Potassium: 3.2 mmol/L — ABNORMAL LOW (ref 3.5–5.1)
Sodium: 140 mmol/L (ref 135–145)

## 2016-07-31 LAB — HEMOGLOBIN A1C
HEMOGLOBIN A1C: 5.6 % (ref 4.8–5.6)
MEAN PLASMA GLUCOSE: 114 mg/dL

## 2016-07-31 LAB — CBC
HCT: 41.2 % (ref 36.0–46.0)
HEMOGLOBIN: 13.4 g/dL (ref 12.0–15.0)
MCH: 27.3 pg (ref 26.0–34.0)
MCHC: 32.5 g/dL (ref 30.0–36.0)
MCV: 83.9 fL (ref 78.0–100.0)
PLATELETS: 266 10*3/uL (ref 150–400)
RBC: 4.91 MIL/uL (ref 3.87–5.11)
RDW: 15 % (ref 11.5–15.5)
WBC: 12 10*3/uL — ABNORMAL HIGH (ref 4.0–10.5)

## 2016-07-31 LAB — MAGNESIUM: Magnesium: 1.9 mg/dL (ref 1.7–2.4)

## 2016-07-31 MED ORDER — POTASSIUM CHLORIDE CRYS ER 20 MEQ PO TBCR
40.0000 meq | EXTENDED_RELEASE_TABLET | Freq: Once | ORAL | Status: AC
  Administered 2016-07-31: 40 meq via ORAL
  Filled 2016-07-31: qty 2

## 2016-07-31 NOTE — Progress Notes (Signed)
Patient Name: Leslie [name] Date of Encounter: 07/31/2016  Primary Cardiologist: Dr. Antonieta Loveless Problem List     Principal Problem:   Atrial fibrillation with rapid ventricular response [facility]) Active Problems:   Essential hypertension, benign   Pure hypercholesterolemia   Obesity, unspecified   Hypothyroidism     Subjective   Frustrated HR arent better. HRs become elevaTed with any amount of activity (washing face this AM) which is symptomatic. Otherwise pretty comfortable at rest.   Inpatient Medications    Scheduled Meds: . apixaban  5 mg Oral BID  . atorvastatin  10 mg Oral Daily  . fluticasone  2 spray Each Nare Daily  . furosemide  20 mg Oral q morning - 10a  . irbesartan  300 mg Oral Daily  . levothyroxine  50 mcg Oral QAC breakfast  . loratadine  10 mg Oral Daily  . magnesium oxide  400 mg Oral Daily  . metoprolol succinate  250 mg Oral q morning - 10a  . pantoprazole  40 mg Oral Daily  . verapamil  360 mg Oral q morning - 10a   Continuous Infusions:  PRN Meds: acetaminophen, ondansetron (ZOFRAN) IV   Vital Signs    Vitals:   07/30/16 2215 07/30/16 2239 07/31/16 0500 07/31/16 0912  BP: (!) 141/67 (!) 146/98 134/76 108/75  Pulse: (!) 104 (!) 141 (!) 104 98  Resp: 13 16    Temp:  98.1 F (36.7 C) 98.1 F (36.7 C)   TempSrc:  Oral Oral   SpO2: 96% 99% 97%   Weight:  291 lb 1.6 oz (132 kg)    Height:  5' 6.5" (1.689 m)      Intake/Output Summary (Last 24 hours) at 07/31/16 0936 Last data filed at 07/31/16 0904  Gross per 24 hour  Intake              420 ml  Output              550 ml  Net             -130 ml   Filed Weights   07/30/16 1354 07/30/16 2239  Weight: 290 lb (131.5 kg) 291 lb 1.6 oz (132 kg)    Physical Exam   GEN: Well nourished, well developed, in no acute distress. obese HEENT: Grossly normal.  Neck: Supple, no JVD, carotid bruits, or masses. Cardiac: irreg irreg, no murmurs, rubs, or gallops. No clubbing, cyanosis,  edema.  Radials/DP/PT 2+ and equal bilaterally.  Respiratory:  Respirations regular and unlabored, clear to auscultation bilaterally. GI: Soft, nontender, nondistended, BS + x 4. MS: no deformity or atrophy. Skin: warm and dry, no rash. Neuro:  Strength and sensation are intact. Psych: AAOx3.  Normal affect.  Labs    CBC  Recent Labs  07/30/16 1355 07/31/16 0435  WBC 13.8* 12.0*  HGB 14.8 13.4  HCT 44.8 41.2  MCV 83.3 83.9  PLT 266 622   Basic Metabolic Panel  Recent Labs  07/30/16 1355 07/30/16 1530 07/31/16 0435  NA 140  --  140  K 3.7  --  3.2*  CL 106  --  105  CO2 21*  --  26  GLUCOSE 101*  --  109*  BUN 15  --  11  CREATININE 0.93  --  1.04*  CALCIUM 8.8*  --  8.3*  MG  --  1.3* 1.9   Liver Function Tests No results for input(s): AST, ALT, ALKPHOS, BILITOT, PROT, ALBUMIN in  the last 72 hours. No results for input(s): LIPASE, AMYLASE in the last 72 hours. Cardiac Enzymes No results for input(s): CKTOTAL, CKMB, CKMBINDEX, TROPONINI in the last 72 hours. BNP Invalid input(s): POCBNP D-Dimer No results for input(s): DDIMER in the last 72 hours. Hemoglobin A1C No results for input(s): HGBA1C in the last 72 hours. Fasting Lipid Panel  Recent Labs  07/31/16 0435  CHOL 146  HDL 32*  LDLCALC 79  TRIG 174*  CHOLHDL 4.6   Thyroid Function Tests  Recent Labs  07/30/16 1530  TSH 2.513    Telemetry    afib with CVR HR in 80-90s at rest but RVR with activity up to 120-160s - Personally Reviewed  ECG    afib with RVR - Personally Reviewed  Radiology    Dg Chest 2 View  Result Date: 07/30/2016 CLINICAL DATA:  71 year old female with new onset of atrial fibrillation. Shortness of breath and nausea for 1 day. EXAM: CHEST  2 VIEW COMPARISON:  Chest x-ray 01/29/2012. FINDINGS: Lung volumes are normal. No consolidative airspace disease. No pleural effusions. No evidence of pulmonary edema. No pneumothorax. Heart size is mildly enlarged. Upper  mediastinal contours are within normal limits. Aortic atherosclerosis. IMPRESSION: 1. No radiographic evidence of acute cardiopulmonary disease. 2. Mild cardiomegaly. 3. Aortic atherosclerosis. Electronically Signed   By: Vinnie Langton M.D.   On: 07/30/2016 15:05    Cardiac Studies   2D ECHO pending  Patient Profile     PORSHIA [name] a 71 y.o. femalewith a history of HTN, HLD, morbid obesity s/p lap band surgery, GERD, OSA on CPAP and asthma who presented to [facility] ED on 07/20/16 from PCP office with afib with RVR.   Assessment & Plan    Afib with RVR: HRs largely unchanged from yesterday. Remains in afib with HRs in 80-90s at rest but rates become elevated (up to 120-160s) with very little activity. When she gets up and moves around she can feel her heart palpitations and feels more tired than usual.  -- Already on a significant amount of AV nodal blocking agents at home: Toprol XL 250mg  and Verapamil CR 180mg  daily. Given an extra 180mg  of Verapamil last night and regular dose increased to Verapamil to 360mg  daily. CHADSVASC score of at least 2 (HTN, age, F sex). Started on Eliquis 5mg  BID (given hx of barretts esophagus and lower risk of GI bleed with this medication).  -- 2D ECHO pending -- Given the large amounts of AV nodal blocking agents and persistant symptomatic RVR with activity, she may need TEE/DCCV. I have tentatively arranged this for tomorrow at 11am. If her HRs improve today, we can always cancel.   HTN: BP a little soft this AM (108/75). Due for Toprol XL 250mg  and Verapamil CR 360mg  and Irbesartan 300mg  daily. I have asked her RN to hold Irbesartan at this time.   Hypothyroidism: TSH normal. Continue synthroid.  HLD: continue statin.   OSA on CPAP: she would like to bring in her own CPAP  Asthma: she is on Symbicort and albuterol, which can worsen afib. Discussed not overusing these.   Morbid obesity: Body mass index is 46.28 kg/m. Discussed the relationship  between obesity and afib and encouraged weight loss.   Hypokalemia: will supplement    Signed,  07/31/2016, 9:36 AM    I have seen and examined the patient along with Angelena Form, PA-C .  I have reviewed the chart, notes and new data.  I agree with PA's note.  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 Mechanism of AI and MR not immediately apparent, but leaflets look a little thick. Denies history of rheumatic fever or phen-fen use.  PLAN: Proceed with TEE and DCCV with sedation in AM, both for AF with difficult rate control and to better evaluate the mechanism and severity of aortic and especially mitral valve disease. This procedure has been fully reviewed with the patient and written informed consent has been obtained.   Sanda Klein, MD, [place] [phone] 07/31/2016, 11:01 AM

## 2016-07-31 NOTE — Progress Notes (Signed)
  Echocardiogram 2D Echocardiogram has been performed.  Leslie [name] 07/31/2016, 11:08 AM

## 2016-07-31 NOTE — H&P (Signed)
HPI: Leslie [name] is a 71 y.o. female with a history of HTN, HLD, morbid obesity s/p lap band surgery, GERD, and asthma who presented to [facility] ED today from PCP office with afib with RVR.   She was sent to cardiology in 2003 by her PCP for general screening. She had an abnormal Cardiolite with subsequent left heart catheterization showing normal LV function and essentially normal coronaries (mild luminal irregularities). She had mildly elevated LVEDP and weight loss was recommended as well as aggressive primary prevention.  She is regularly followed by Dr. Inda Merlin. She was in her usual state of health until last night when she started to notice palpitations and found that her heart rate was 140 by blood pressure cuff. She also noted her blood pressure to be high at 159/140. She went to bed and awoke this morning still feeling an irregular heartbeat. She made an appointment at PCP office where she was found to be in A. fib with RVR. EMS was called and she was transported to [facility].  She was given IV diltiazem without significant improvement in heart rates. This was then followed by IV Lopressor 10 mg, with improvement of heart rates into 80s and 90s. She is basically asymptomatic unless heart rates are up. No CP. No LE edema, orthopnea or PND. No dizziness or syncope. No blood in stool or urine. She is noted to be on Toprol XL 266m and Verapamil CR 1876mdaily. She said she did have some palpitations (no ECG done) a few months ago and Dr. GaInda Merlinad increased her Toprol XL. She has noticed some worsening exertional SOB recently when going up stairs. She has not been very active recently due to knee pain.         Past Medical History:  Diagnosis Date  . Adenomatous polyp   . Asthma   . Chronic kidney disease    stage 3  . GERD (gastroesophageal reflux disease)    barrett's esophagus- Dr. BuCristina Gong. Hyperlipidemia   . Hypertension   . Migraines   . Obesity    s/p lap  band surgery 6/10  . Vitamin D deficiency           Past Surgical History:  Procedure Laterality Date  . APPENDECTOMY     incidental  . COMBINED HYSTERECTOMY VAGINAL / OOPHORECTOMY / A&P REPAIR  1981  . lap band surgery and hiatal hernia repair 6/10    . left inguinal hernia repair  1952  . os cataract surgery 4/08    . TONSILLECTOMY AND ADENOIDECTOMY  1974         Allergies  Allergen Reactions  . Augmentin [Amoxicillin-Pot Clavulanate] Diarrhea  . Codeine Nausea Only and Other (See Comments)    Hyperactivity (also)   . Demerol [Meperidine] Nausea And Vomiting  . Sulfa Antibiotics Rash    I have reviewed the patient's current medications            Prior to Admission medications   Medication Sig Start Date End Date Taking? Authorizing Provider  acetaminophen (TYLENOL) 500 MG tablet Take 500-1,000 mg by mouth every 6 (six) hours as needed (for pain).   Yes Historical Provider, MD  albuterol (PROVENTIL HFA;VENTOLIN HFA) 108 (90 BASE) MCG/ACT inhaler Inhale 1-2 puffs into the lungs every 6 (six) hours as needed for wheezing or shortness of breath.   Yes Historical Provider, MD  atorvastatin (LIPITOR) 10 MG tablet Take 10 mg by mouth daily.   Yes Historical Provider, MD  Azelaic Acid-Cleanser-Lotion (FINACEA PLUS) 15 % KIT Apply 1 application topically daily.    Yes Historical Provider, MD  cholecalciferol (VITAMIN D) 1000 UNITS tablet Take 1,000 Units by mouth daily.   Yes Historical Provider, MD  fexofenadine (ALLEGRA) 180 MG tablet Take 180 mg by mouth daily.   Yes Historical Provider, MD  fluticasone (FLONASE) 50 MCG/ACT nasal spray Place 2 sprays into both nostrils daily.   Yes Historical Provider, MD  furosemide (LASIX) 20 MG tablet Take 20 mg by mouth every morning. 05/20/16  Yes Historical Provider, MD  Glucosamine-Chondroitin (OSTEO BI-FLEX REGULAR STRENGTH PO) Take 1 tablet by mouth daily.   Yes Historical Provider, MD   levothyroxine (SYNTHROID, LEVOTHROID) 50 MCG tablet Take 50 mcg by mouth daily before breakfast.   Yes Historical Provider, MD  metoprolol succinate (TOPROL-XL) 100 MG 24 hr tablet Take 250 mg by mouth every morning. 07/23/16  Yes Historical Provider, MD  Multiple Vitamins-Minerals (CENTRUM SILVER ADULT 50+ PO) Take 1 tablet by mouth daily.    Yes Historical Provider, MD  omeprazole (PRILOSEC) 20 MG capsule Take 40 mg by mouth daily before breakfast. 07/27/16  Yes Historical Provider, MD  SYMBICORT 160-4.5 MCG/ACT inhaler Inhale 2 puffs into the lungs 2 (two) times daily. 04/30/16  Yes Historical Provider, MD  valsartan (DIOVAN) 320 MG tablet Take 320 mg by mouth daily. 07/29/16  Yes Historical Provider, MD  verapamil (CALAN-SR) 180 MG CR tablet Take 180 mg by mouth every morning.    Yes Historical Provider, MD  Vitamin D, Ergocalciferol, (DRISDOL) 50000 UNITS CAPS capsule Take 50,000 Units by mouth every 7 (seven) days.   Yes Historical Provider, MD     Social History        Social History  . Marital status: Single    Spouse name: N/A  . Number of children: N/A  . Years of education: N/A      Occupational History  . Not on file.         Social History Main Topics  . Smoking status: Former Research scientist (life sciences)  . Smokeless tobacco: Never Used  . Alcohol use Yes     Comment: occassional  . Drug use: No  . Sexual activity: Not on file       Other Topics Concern  . Not on file      Social History Narrative  . No narrative on file        Family Status  Relation Status  . Mother Deceased  . Father Deceased  . Brother Alive        Family History  Problem Relation Age of Onset  . Heart failure Mother   . Hypertension Mother   . Hypertension Father   . Emphysema Father    ROS:  Full 14 point review of systems complete and found to be negative unless listed above.  Physical Exam: Blood pressure 140/74, pulse 97, temperature 98 F (36.7 C), temperature  source Oral, resp. rate 14, height 5' 6.5" (1.689 m), weight 290 lb (131.5 kg), SpO2 98 %.  General: Well developed, well nourished, female in no acute distress, obese Head: Eyes PERRLA, No xanthomas.   Normocephalic and atraumatic, oropharynx without edema or exudate.   Lungs: CTAB Heart: irreg irreg, S1 S2, no rub/gallop, Heart irregular rate and rhythm with S1, S2  No murmur. pulses are 2+ extrem.   Neck: No carotid bruits. No lymphadenopathy.  No JVD. Abdomen: Bowel sounds present, abdomen soft and non-tender without masses or hernias noted. Msk:  No  spine or cva tenderness. No weakness, no joint deformities or effusions. Extremities: No clubbing or cyanosis. No LE  edema.  Neuro: Alert and oriented X 3. No focal deficits noted. Psych:  Good affect, responds appropriately Skin: No rashes or lesions noted.  Labs:   Recent Labs  Lab Results  Component Value Date   WBC 13.8 (H) 07/30/2016   HGB 14.8 07/30/2016   HCT 44.8 07/30/2016   MCV 83.3 07/30/2016   PLT 266 07/30/2016     Recent Labs (last 2 labs)   No results for input(s): INR in the last 72 hours.     Last Labs    Recent Labs Lab 07/30/16 1355  NA 140  K 3.7  CL 106  CO2 21*  BUN 15  CREATININE 0.93  CALCIUM 8.8*  GLUCOSE 101*     Last Labs       Magnesium  Date Value Ref Range Status  07/30/2016 1.3 (L) 1.7 - 2.4 mg/dL Final     Recent Labs (last 2 labs)   No results for input(s): CKTOTAL, CKMB, TROPONINI in the last 72 hours.   Recent Labs (last 2 labs)   No results for input(s): TROPIPOC in the last 72 hours.   Last Labs  No results found for: PROBNP   Recent Labs  No results found for: CHOL, HDL, LDLCALC, TRIG   Recent Labs  No results found for: DDIMER   Last Labs  No results found for: LIPASE, AMYLASE   Last Labs         TSH  Date/Time Value Ref Range Status  07/30/2016 03:30 PM 2.513 0.350 - 4.500 uIU/mL Final    Comment:    Performed by a 3rd Generation assay  with a functional sensitivity of <=0.01 uIU/mL.     Last Labs  No results found for: VITAMINB12, FOLATE, FERRITIN, TIBC, IRON, RETICCTPCT    Echo: none  Cath 2003 OVERALL IMPRESSION: 1. Normal left ventricular function. EF 60%. 2. Essentially normal coronary arteries with minimal irregularities.  DISCUSSION: It is felt that the patient will need to modify cardiovascular risk factors at this point in time. Her end-diastolic pressure is slightly elevated, and we will discuss this finding with her and encourage modification of risk factors and, in particular, weight loss.   ECG:  afib with RVR - personally reviewed  TELE: afib with RVR, now rate controlled with HR in 90s - personally reviewed  Radiology:   Imaging Results (Last 48 hours)  Dg Chest 2 View  Result Date: 07/30/2016 CLINICAL DATA:  71 year old female with new onset of atrial fibrillation. Shortness of breath and nausea for 1 day. EXAM: CHEST  2 VIEW COMPARISON:  Chest x-ray 01/29/2012. FINDINGS: Lung volumes are normal. No consolidative airspace disease. No pleural effusions. No evidence of pulmonary edema. No pneumothorax. Heart size is mildly enlarged. Upper mediastinal contours are within normal limits. Aortic atherosclerosis. IMPRESSION: 1. No radiographic evidence of acute cardiopulmonary disease. 2. Mild cardiomegaly. 3. Aortic atherosclerosis. Electronically Signed   By: Vinnie Langton M.D.   On: 07/30/2016 15:05     ASSESSMENT AND PLAN:    Principal Problem:   Atrial fibrillation with rapid ventricular response (HCC) Active Problems:   Essential hypertension, benign   Pure hypercholesterolemia   Obesity, unspecified   Hypothyroidism  WILEEN DUNCANSON is a 71 y.o. female with a history of HTN, HLD, morbid obesity s/p lap band surgery, GERD, OSA on CPAP and asthma who presented to [facility] ED today from PCP  office with afib with RVR.   Afib with RVR: rate now better controlled after IV dilt  and IV metoprolol. Already on a significant amount of AV nodal blocking agents at home: Toprol XL 282m and Verapamil CR 1833mdaily. Will increase Verapamil to 36051maily. She has already taken her AM meds so will give her 180m81mw. Will continue home Toprol XL 250mg37mly. CHADSVASC score of at least 2 (HTN, age, F sex). Will start Xarelto and order 2D ECHO. If HR remains normal overnight, likely discharge home tomorrow.   HTN: BP now well controlled   Hypothyroidism: TSH normal. Continue synthroid.  HLD: continue statin.   OSA on CPAP: she would like to bring in her own CPAP  Asthma: she is on Symbicort and albuterol, which can worsen afib. Discussed not overusing these.   Signed: KathrAngelena FormC 07/30/2016 6:45 PM  Pager [phone] seen and examined the patient along with KathrAngelena FormC.  I have reviewed the chart, notes and new data.  I agree with PA's note.  Key new complaints: rapid palpitations, not much else in the way of CV complaints. Already on a lot of beta blocker and moderate dose verapamil. Has a history of Barrett's esophagus, but no bleeding. On CPAP for OSA and on beta-agonist bronchodilators for asthma. Key examination changes: irregular rhythm, ventricular rate around 100 at rest, obesity, otherwise normal exam Key new findings / data: TSH normal, minimal increase in WBC, ECG with AF/RVR, no ischemic changes. Mg 1.3.  PLAN: Increase verapamil. Avoid excessive use of beta-agonists, especially LABA. Replace Mg. Due to the very high doses of negative chronotrope agents she takes and her age, need to monitor ventricular rate response at least overnight. Start DOAC -discussed pros and cons. Prefer Eliquis due to possible lower GI bleed risk than Xarelto, in view of esophagitis history. Urgent cardioversion and antiarrhythmics do not appear indicated.  MihaiSanda Klein FACC [phone], 7:40 PM

## 2016-07-31 NOTE — Progress Notes (Signed)
   Called by pharmacy over concern that we are using a DOAC in a patient >120 KG. I discussed with Dr. Sallyanne Kuster and we feel that it is safe to continue using Eliquis over coumadin in this patient.   Angelena Form PA-C  [facility]

## 2016-07-31 NOTE — Care Management Obs Status (Signed)
[place] NOTIFICATION   Patient Details  Name: Leslie [name] MRN: [id number] Date of Birth: 1945/09/07   Medicare Observation Status Notification Given:  Yes    Bethena Roys, RN 07/31/2016, 5:14 PM

## 2016-08-01 ENCOUNTER — Inpatient Hospital Stay (HOSPITAL_COMMUNITY): Payer: Medicare Other | Admitting: Anesthesiology

## 2016-08-01 ENCOUNTER — Inpatient Hospital Stay (HOSPITAL_COMMUNITY): Payer: Medicare Other

## 2016-08-01 ENCOUNTER — Encounter (HOSPITAL_COMMUNITY)
Admission: EM | Disposition: A | Discharge status: Home / Self Care | Payer: Self-pay | Source: Home / Self Care | Attending: Cardiovascular Disease

## 2016-08-01 ENCOUNTER — Encounter (HOSPITAL_COMMUNITY): Payer: Self-pay | Admitting: Anesthesiology

## 2016-08-01 DIAGNOSIS — I340 Nonrheumatic mitral (valve) insufficiency: Secondary | ICD-10-CM

## 2016-08-01 HISTORY — PX: CARDIOVERSION: SHX1299

## 2016-08-01 HISTORY — PX: TEE WITHOUT CARDIOVERSION: SHX5443

## 2016-08-01 SURGERY — ECHOCARDIOGRAM, TRANSESOPHAGEAL
Anesthesia: General

## 2016-08-01 MED ORDER — POTASSIUM CHLORIDE CRYS ER 20 MEQ PO TBCR
40.0000 meq | EXTENDED_RELEASE_TABLET | Freq: Once | ORAL | Status: AC
  Administered 2016-08-01: 40 meq via ORAL
  Filled 2016-08-01: qty 2

## 2016-08-01 MED ORDER — SODIUM CHLORIDE 0.9 % IV SOLN: INTRAVENOUS | Status: DC

## 2016-08-01 MED ORDER — LACTATED RINGERS IV SOLN
INTRAVENOUS | Status: DC | PRN
  Administered 2016-08-01: 10:00:00 via INTRAVENOUS

## 2016-08-01 MED ORDER — ONDANSETRON HCL 4 MG/2ML IJ SOLN: 4.0000 mg | Freq: Once | INTRAMUSCULAR | Status: DC | PRN

## 2016-08-01 MED ORDER — PROPOFOL 500 MG/50ML IV EMUL
INTRAVENOUS | Status: DC | PRN
  Administered 2016-08-01: 100 ug/kg/min via INTRAVENOUS

## 2016-08-01 MED ORDER — BUTAMBEN-TETRACAINE-BENZOCAINE 2-2-14 % EX AERO
INHALATION_SPRAY | CUTANEOUS | Status: DC | PRN
  Administered 2016-08-01: 2 via TOPICAL

## 2016-08-01 NOTE — Anesthesia Procedure Notes (Signed)
Date/Time: 08/01/2016 11:26 AM Performed by: Jenne Campus Pre-anesthesia Checklist: Patient identified, Emergency Drugs available, Suction available, Patient being monitored and Timeout performed Patient Re-evaluated:Patient Re-evaluated prior to inductionOxygen Delivery Method: Nasal cannula

## 2016-08-01 NOTE — Interval H&P Note (Signed)
History and Physical Interval Note:  08/01/2016 10:53 AM  Leslie [name]  has presented today for surgery, with the diagnosis of afib  The various methods of treatment have been discussed with the patient and family. After consideration of risks, benefits and other options for treatment, the patient has consented to  Procedure(s): TRANSESOPHAGEAL ECHOCARDIOGRAM (TEE) (N/A) CARDIOVERSION (N/A) as a surgical intervention .  The patient's history has been reviewed, patient examined, no change in status, stable for surgery.  I have reviewed the patient's chart and labs.  Questions were answered to the patient's satisfaction.     [employer]

## 2016-08-01 NOTE — Transfer of Care (Signed)
Immediate Anesthesia Transfer of Care Note  Patient: Leslie [name]  Procedure(s) Performed: Procedure(s): TRANSESOPHAGEAL ECHOCARDIOGRAM (TEE) (N/A) CARDIOVERSION (N/A)  Patient Location: Endoscopy Unit  Anesthesia Type:MAC  Level of Consciousness: awake, oriented and patient cooperative  Airway & Oxygen Therapy: Patient Spontanous Breathing and Patient connected to nasal cannula oxygen  Post-op Assessment: Report given to RN and Post -op Vital signs reviewed and stable  Post vital signs: Reviewed  Last Vitals:  Vitals:   08/01/16 1031 08/01/16 1142  BP: 114/69 (!) 104/50  Pulse: 94 70  Resp: 18 15  Temp: 37.1 C     Last Pain:  Vitals:   08/01/16 1142  TempSrc: Oral  PainSc:          Complications: No apparent anesthesia complications

## 2016-08-01 NOTE — Discharge Instructions (Addendum)
Information on my medicine - ELIQUIS (apixaban)  This medication education was reviewed with me or my healthcare representative as part of my discharge preparation.  The pharmacist that spoke with me during my hospital stay was:  Jaquita Folds, [facility]  Why was Eliquis prescribed for you? Eliquis was prescribed for you to reduce the risk of a blood clot forming that can cause a stroke if you have a medical condition called atrial fibrillation (a type of irregular heartbeat).  What do You need to know about Eliquis ? Take your Eliquis TWICE DAILY - one tablet in the morning and one tablet in the evening with or without food. If you have difficulty swallowing the tablet whole please discuss with your pharmacist how to take the medication safely.  Take Eliquis exactly as prescribed by your doctor and DO NOT stop taking Eliquis without talking to the doctor who prescribed the medication.  Stopping may increase your risk of developing a stroke.  Refill your prescription before you run out.  After discharge, you should have regular check-up appointments with your healthcare provider that is prescribing your Eliquis.  In the future your dose may need to be changed if your kidney function or weight changes by a significant amount or as you get older.  What do you do if you miss a dose? If you miss a dose, take it as soon as you remember on the same day and resume taking twice daily.  Do not take more than one dose of ELIQUIS at the same time to make up a missed dose.  Important Safety Information A possible side effect of Eliquis is bleeding. You should call your healthcare provider right away if you experience any of the following: ? Bleeding from an injury or your nose that does not stop. ? Unusual colored urine (red or dark brown) or unusual colored stools (red or black). ? Unusual bruising for unknown reasons. ? A serious fall or if you hit your head (even if there is no bleeding).  Some  medicines may interact with Eliquis and might increase your risk of bleeding or clotting while on Eliquis. To help avoid this, consult your healthcare provider or pharmacist prior to using any new prescription or non-prescription medications, including herbals, vitamins, non-steroidal anti-inflammatory drugs (NSAIDs) and supplements.  This website has more information on Eliquis (apixaban): http://www.eliquis.com/eliquis/home  Atrial Fibrillation Atrial fibrillation is a type of heartbeat that is irregular or fast (rapid). If you have this condition, your heart keeps quivering in a weird (chaotic) way. This condition can make it so your heart cannot pump blood normally. Having this condition gives a person more risk for stroke, heart failure, and other heart problems. There are different types of atrial fibrillation. Talk with your doctor to learn about the type that you have. Follow these instructions at home:  Take over-the-counter and prescription medicines only as told by your doctor.  If your doctor prescribed a blood-thinning medicine, take it exactly as told. Taking too much of it can cause bleeding. If you do not take enough of it, you will not have the protection that you need against stroke and other problems.  Do not use any tobacco products. These include cigarettes, chewing tobacco, and e-cigarettes. If you need help quitting, ask your doctor.  If you have apnea (obstructive sleep apnea), manage it as told by your doctor.  Do not drink alcohol.  Do not drink beverages that have caffeine. These include coffee, soda, and tea.  Maintain a  healthy weight. Do not use diet pills unless your doctor says they are safe for you. Diet pills may make heart problems worse.  Follow diet instructions as told by your doctor.  Exercise regularly as told by your doctor.  Keep all follow-up visits as told by your doctor. This is important. Contact a doctor if:  You notice a change in the  speed, rhythm, or strength of your heartbeat.  You are taking a blood-thinning medicine and you notice more bruising.  You get tired more easily when you move or exercise. Get help right away if:  You have pain in your chest or your belly (abdomen).  You have sweating or weakness.  You feel sick to your stomach (nauseous).  You notice blood in your throw up (vomit), poop (stool), or pee (urine).  You are short of breath.  You suddenly have swollen feet and ankles.  You feel dizzy.  Your suddenly get weak or numb in your face, arms, or legs, especially if it happens on one side of your body.  You have trouble talking, trouble understanding, or both.  Your face or your eyelid droops on one side. These symptoms may be an emergency. Do not wait to see if the symptoms will go away. Get medical help right away. Call your local emergency services (911 in the [place] Do not drive yourself to the hospital. This information is not intended to replace advice given to you by your health care provider. Make sure you discuss any questions you have with your health care provider. Document Released: 01/09/2008 Document Revised: 09/07/2015 Document Reviewed: 07/27/2014 Elsevier Interactive Patient Education  2017 [employer].

## 2016-08-01 NOTE — Progress Notes (Signed)
  Echocardiogram Echocardiogram Transesophageal has been performed.  Johny Chess 08/01/2016, 12:57 PM

## 2016-08-01 NOTE — CV Procedure (Addendum)
   TEE/CV  Indications: AFIB  Time out performed  Sedation with propofol under supervision of anesthesia.  Findings:  - Normal EF 50-55% - Mild MR - Moderate AI - Mild to moderate TR - Negative bubble study  Successful cardioversion, 120J X 1  Discussed with family and referring.   Candee Furbish, MD

## 2016-08-01 NOTE — Progress Notes (Signed)
Progress Note  Patient Name: Leslie [name] Date of Encounter: 08/01/2016  Primary Cardiologist: Dr. Acie Fredrickson   Subjective   In bed resting comfortably, despite Vrates in the 130s. She is asymptomatic currently. More symptomatic with activity.   Inpatient Medications    Scheduled Meds: . apixaban  5 mg Oral BID  . atorvastatin  10 mg Oral Daily  . fluticasone  2 spray Each Nare Daily  . furosemide  20 mg Oral q morning - 10a  . irbesartan  300 mg Oral Daily  . levothyroxine  50 mcg Oral QAC breakfast  . loratadine  10 mg Oral Daily  . magnesium oxide  400 mg Oral Daily  . metoprolol succinate  250 mg Oral q morning - 10a  . pantoprazole  40 mg Oral Daily  . verapamil  360 mg Oral q morning - 10a   Continuous Infusions:  PRN Meds: acetaminophen, ondansetron (ZOFRAN) IV   Vital Signs    Vitals:   07/31/16 1530 07/31/16 1854 07/31/16 2145 08/01/16 0433  BP: 115/68 (!) 123/54 (!) 104/52 120/70  Pulse:   75 86  Resp:    18  Temp:   98 F (36.7 C) 98.4 F (36.9 C)  TempSrc:   Oral Oral  SpO2:   95% 97%  Weight:    292 lb 8 oz (132.7 kg)  Height:        Intake/Output Summary (Last 24 hours) at 08/01/16 0643 Last data filed at 08/01/16 0435  Gross per 24 hour  Intake              420 ml  Output              700 ml  Net             -280 ml   Filed Weights   07/30/16 1354 07/30/16 2239 08/01/16 0433  Weight: 290 lb (131.5 kg) 291 lb 1.6 oz (132 kg) 292 lb 8 oz (132.7 kg)    Telemetry    Atrial fibrillation w/ RVR 120s-130s - Personally Reviewed  ECG    Atrial fibrillation  - Personally Reviewed  Physical Exam   GEN: No acute distress. Obese    Neck: No JVD Cardiac:irregularly irregular, tachy rate, no murmurs, rubs, or gallops.  Respiratory: Clear to auscultation bilaterally. GI: Soft, nontender, non-distended  MS: No edema; No deformity. Neuro:  Nonfocal  Psych: Normal affect   Labs    Chemistry Recent Labs Lab 07/30/16 1355 07/31/16 0435    NA 140 140  K 3.7 3.2*  CL 106 105  CO2 21* 26  GLUCOSE 101* 109*  BUN 15 11  CREATININE 0.93 1.04*  CALCIUM 8.8* 8.3*  GFRNONAA >60 53*  GFRAA >60 >60  ANIONGAP 13 9     Hematology Recent Labs Lab 07/30/16 1355 07/31/16 0435  WBC 13.8* 12.0*  RBC 5.38* 4.91  HGB 14.8 13.4  HCT 44.8 41.2  MCV 83.3 83.9  MCH 27.5 27.3  MCHC 33.0 32.5  RDW 15.0 15.0  PLT 266 266    Cardiac EnzymesNo results for input(s): TROPONINI in the last 168 hours. No results for input(s): TROPIPOC in the last 168 hours.   BNPNo results for input(s): BNP, PROBNP in the last 168 hours.   DDimer No results for input(s): DDIMER in the last 168 hours.   Radiology    Dg Chest 2 View  Result Date: 07/30/2016 CLINICAL DATA:  71 year old female with new onset of atrial fibrillation. Shortness of  breath and nausea for 1 day. EXAM: CHEST  2 VIEW COMPARISON:  Chest x-ray 01/29/2012. FINDINGS: Lung volumes are normal. No consolidative airspace disease. No pleural effusions. No evidence of pulmonary edema. No pneumothorax. Heart size is mildly enlarged. Upper mediastinal contours are within normal limits. Aortic atherosclerosis. IMPRESSION: 1. No radiographic evidence of acute cardiopulmonary disease. 2. Mild cardiomegaly. 3. Aortic atherosclerosis. Electronically Signed   By: Vinnie Langton M.D.   On: 07/30/2016 15:05    Cardiac Studies   2D Echo 07/31/16  Study Conclusions  - Left ventricle: The cavity size was normal. There was moderate   concentric hypertrophy. Systolic function was normal. The   estimated ejection fraction was in the range of 55% to 60%. Wall   motion was normal; there were no regional wall motion   abnormalities. - Aortic valve: There was moderate regurgitation. - Mitral valve: There was mild to moderate regurgitation. - Left atrium: The atrium was mildly dilated. Anterior-posterior   dimension: 43 mm. - Pulmonic valve: There was mild regurgitation.  Patient Profile     Leslie [name] a 71 y.o. femalewith a history of HTN, HLD, morbid obesity s/p lap band surgery, GERD, OSA on CPAP and asthma who presented to [facility] ED on 07/20/16 from PCP office with afib with RVR.   Assessment & Plan     1. Atrial Fibrillation w/ RVR: asymptomatic at rest, but pt with continued symptomatic afib w/ RVR with activity. Not responding to high doses of AVN blocking agents. Plan is for TEE guided DCCV today for difficult to control afib. Continue Eliquis for a/c given CHA2DS2 VASc score of 3 (HTN, Age 70-70 and female sex). Continue metoprolol and verapamil. 2D echo shows normal LVEF. TSH WNL.   2. Aortic Insufficiency: moderate by TTE. Will further examine during TEE, which is scheduled prior to cardioversion today.   3. Mitral Regurgitation: mild to moderate by TTE. Will further examine during TEE, which is scheduled prior to cardioversion today.   4. HTN: stable and controlled on current regimen.    5.  Hypothyroidism: TSH normal. Continue synthroid.  6.  HLD: continue statin.   7.  OSA on CPAP: reports full compliance. Continue.   8. Asthma: she is on Symbicort and albuterol, which can worsen afib. Discussed not overusing these. Consider switch from albuterol to xopenex to reduce risk for tachycardia.   9.  Morbid obesity: Body mass index is 46.28 kg/m. Discussed the relationship between obesity and afib and encouraged weight loss.   10.  Hypokalemia: K 3.2 today. Will supplement. Give 40 mEq of K-dur.   Signed, Lyda Jester, PA-C  08/01/2016, 6:43 AM     I have seen and examined the patient along with Lyda Jester, PA-C .  I have reviewed the chart, notes and new data.  I agree with PA's note.  Key new complaints: mildly dyspneic with light activity, OK at rest Key examination changes: no overt hypervolemia, but exam difficult with morbid obesity; rapid irregular rhythm Key new findings / data: echo reports EF 55-60% (I think it is 50%), mild to  moderate MR (at least moderate in my opinion, possibly moderate to severe) and moderate AI (I agree)  PLAN: TEE -CV today. Questions answered re: procedure. She is very hard to rate control and if persistent atrial fibrillation recurs, will have to pursue a rhythm control strategy, antiarrhythmics (tikosyn?) in parallel with efforts at weight loss and aggressive treatment of OSA and HTN. Lifelong anticoagulation. Despite weight, DOAC still the  best choice for her.  Sanda Klein, MD, [place] [phone] 08/01/2016, 10:16 AM

## 2016-08-01 NOTE — H&P (View-Only) (Signed)
Progress Note  Patient Name: Leslie [name] Date of Encounter: 08/01/2016  Primary Cardiologist: Dr. Acie Fredrickson   Subjective   In bed resting comfortably, despite Vrates in the 130s. She is asymptomatic currently. More symptomatic with activity.   Inpatient Medications    Scheduled Meds: . apixaban  5 mg Oral BID  . atorvastatin  10 mg Oral Daily  . fluticasone  2 spray Each Nare Daily  . furosemide  20 mg Oral q morning - 10a  . irbesartan  300 mg Oral Daily  . levothyroxine  50 mcg Oral QAC breakfast  . loratadine  10 mg Oral Daily  . magnesium oxide  400 mg Oral Daily  . metoprolol succinate  250 mg Oral q morning - 10a  . pantoprazole  40 mg Oral Daily  . verapamil  360 mg Oral q morning - 10a   Continuous Infusions:  PRN Meds: acetaminophen, ondansetron (ZOFRAN) IV   Vital Signs    Vitals:   07/31/16 1530 07/31/16 1854 07/31/16 2145 08/01/16 0433  BP: 115/68 (!) 123/54 (!) 104/52 120/70  Pulse:   75 86  Resp:    18  Temp:   98 F (36.7 C) 98.4 F (36.9 C)  TempSrc:   Oral Oral  SpO2:   95% 97%  Weight:    292 lb 8 oz (132.7 kg)  Height:        Intake/Output Summary (Last 24 hours) at 08/01/16 0643 Last data filed at 08/01/16 0435  Gross per 24 hour  Intake              420 ml  Output              700 ml  Net             -280 ml   Filed Weights   07/30/16 1354 07/30/16 2239 08/01/16 0433  Weight: 290 lb (131.5 kg) 291 lb 1.6 oz (132 kg) 292 lb 8 oz (132.7 kg)    Telemetry    Atrial fibrillation w/ RVR 120s-130s - Personally Reviewed  ECG    Atrial fibrillation  - Personally Reviewed  Physical Exam   GEN: No acute distress. Obese    Neck: No JVD Cardiac:irregularly irregular, tachy rate, no murmurs, rubs, or gallops.  Respiratory: Clear to auscultation bilaterally. GI: Soft, nontender, non-distended  MS: No edema; No deformity. Neuro:  Nonfocal  Psych: Normal affect   Labs    Chemistry Recent Labs Lab 07/30/16 1355 07/31/16 0435    NA 140 140  K 3.7 3.2*  CL 106 105  CO2 21* 26  GLUCOSE 101* 109*  BUN 15 11  CREATININE 0.93 1.04*  CALCIUM 8.8* 8.3*  GFRNONAA >60 53*  GFRAA >60 >60  ANIONGAP 13 9     Hematology Recent Labs Lab 07/30/16 1355 07/31/16 0435  WBC 13.8* 12.0*  RBC 5.38* 4.91  HGB 14.8 13.4  HCT 44.8 41.2  MCV 83.3 83.9  MCH 27.5 27.3  MCHC 33.0 32.5  RDW 15.0 15.0  PLT 266 266    Cardiac EnzymesNo results for input(s): TROPONINI in the last 168 hours. No results for input(s): TROPIPOC in the last 168 hours.   BNPNo results for input(s): BNP, PROBNP in the last 168 hours.   DDimer No results for input(s): DDIMER in the last 168 hours.   Radiology    Dg Chest 2 View  Result Date: 07/30/2016 CLINICAL DATA:  71 year old female with new onset of atrial fibrillation. Shortness of  breath and nausea for 1 day. EXAM: CHEST  2 VIEW COMPARISON:  Chest x-ray 01/29/2012. FINDINGS: Lung volumes are normal. No consolidative airspace disease. No pleural effusions. No evidence of pulmonary edema. No pneumothorax. Heart size is mildly enlarged. Upper mediastinal contours are within normal limits. Aortic atherosclerosis. IMPRESSION: 1. No radiographic evidence of acute cardiopulmonary disease. 2. Mild cardiomegaly. 3. Aortic atherosclerosis. Electronically Signed   By: Vinnie Langton M.D.   On: 07/30/2016 15:05    Cardiac Studies   2D Echo 07/31/16  Study Conclusions  - Left ventricle: The cavity size was normal. There was moderate   concentric hypertrophy. Systolic function was normal. The   estimated ejection fraction was in the range of 55% to 60%. Wall   motion was normal; there were no regional wall motion   abnormalities. - Aortic valve: There was moderate regurgitation. - Mitral valve: There was mild to moderate regurgitation. - Left atrium: The atrium was mildly dilated. Anterior-posterior   dimension: 43 mm. - Pulmonic valve: There was mild regurgitation.  Patient Profile     Leslie [name] a 71 y.o. femalewith a history of HTN, HLD, morbid obesity s/p lap band surgery, GERD, OSA on CPAP and asthma who presented to [facility] ED on 07/20/16 from PCP office with afib with RVR.   Assessment & Plan     1. Atrial Fibrillation w/ RVR: asymptomatic at rest, but pt with continued symptomatic afib w/ RVR with activity. Not responding to high doses of AVN blocking agents. Plan is for TEE guided DCCV today for difficult to control afib. Continue Eliquis for a/c given CHA2DS2 VASc score of 3 (HTN, Age 25-74 and female sex). Continue metoprolol and verapamil. 2D echo shows normal LVEF. TSH WNL.   2. Aortic Insufficiency: moderate by TTE. Will further examine during TEE, which is scheduled prior to cardioversion today.   3. Mitral Regurgitation: mild to moderate by TTE. Will further examine during TEE, which is scheduled prior to cardioversion today.   4. HTN: stable and controlled on current regimen.    5.  Hypothyroidism: TSH normal. Continue synthroid.  6.  HLD: continue statin.   7.  OSA on CPAP: reports full compliance. Continue.   8. Asthma: she is on Symbicort and albuterol, which can worsen afib. Discussed not overusing these. Consider switch from albuterol to xopenex to reduce risk for tachycardia.   9.  Morbid obesity: Body mass index is 46.28 kg/m. Discussed the relationship between obesity and afib and encouraged weight loss.   10.  Hypokalemia: K 3.2 today. Will supplement. Give 40 mEq of K-dur.   Signed, Lyda Jester, PA-C  08/01/2016, 6:43 AM     I have seen and examined the patient along with Lyda Jester, PA-C .  I have reviewed the chart, notes and new data.  I agree with PA's note.  Key new complaints: mildly dyspneic with light activity, OK at rest Key examination changes: no overt hypervolemia, but exam difficult with morbid obesity; rapid irregular rhythm Key new findings / data: echo reports EF 55-60% (I think it is 50%), mild to  moderate MR (at least moderate in my opinion, possibly moderate to severe) and moderate AI (I agree)  PLAN: TEE -CV today. Questions answered re: procedure. She is very hard to rate control and if persistent atrial fibrillation recurs, will have to pursue a rhythm control strategy, antiarrhythmics (tikosyn?) in parallel with efforts at weight loss and aggressive treatment of OSA and HTN. Lifelong anticoagulation. Despite weight, DOAC still the  best choice for her.  Sanda Klein, MD, [place] [phone] 08/01/2016, 10:16 AM

## 2016-08-01 NOTE — Anesthesia Preprocedure Evaluation (Addendum)
Anesthesia Evaluation  Patient identified by MRN, date of birth, ID band  Reviewed: Allergy & Precautions, NPO status , Patient's Chart, lab work & pertinent test results, reviewed documented beta blocker date and time   History of Anesthesia Complications (+) PONV and history of anesthetic complications  Airway Mallampati: II  TM Distance: >3 FB Neck ROM: Full    Dental no notable dental hx. (+) Teeth Intact, Dental Advisory Given   Pulmonary asthma , sleep apnea , former smoker,    Pulmonary exam normal        Cardiovascular hypertension, Pt. on medications and Pt. on home beta blockers Normal cardiovascular exam+ dysrhythmias Atrial Fibrillation + Valvular Problems/Murmurs AI and MR  Rhythm:Irregular Rate:Normal + Systolic murmurs    Neuro/Psych  Headaches, negative psych ROS   GI/Hepatic hiatal hernia, GERD  ,  Endo/Other  Hypothyroidism Morbid obesity  Renal/GU Renal InsufficiencyRenal disease  negative genitourinary   Musculoskeletal  (+) Arthritis ,   Abdominal (+) + obese,   Peds  Hematology   Anesthesia Other Findings   Reproductive/Obstetrics                            Anesthesia Physical Anesthesia Plan  ASA: III  Anesthesia Plan: General   Post-op Pain Management:    Induction: Intravenous  Airway Management Planned: Mask  Additional Equipment:   Intra-op Plan:   Post-operative Plan: Extubation in OR  Informed Consent: I have reviewed the patients History and Physical, chart, labs and discussed the procedure including the risks, benefits and alternatives for the proposed anesthesia with the patient or authorized representative who has indicated his/her understanding and acceptance.     Plan Discussed with: CRNA, Anesthesiologist and Surgeon  Anesthesia Plan Comments:         Anesthesia Quick Evaluation

## 2016-08-01 NOTE — Progress Notes (Signed)
Pt returned to unit in stable condition s/p TEE & successful cardioversion.  Telephone report received prior to pt return.  NSR on tele with HR mid 70-80's.  Alert & oriented x4.  Denies any pain or discomfort.  VSS.  Orders reviewed.  Family present at bedside.  Will continue to monitor.

## 2016-08-01 NOTE — Anesthesia Postprocedure Evaluation (Signed)
Anesthesia Post Note  Patient: Leslie [name]  Procedure(s) Performed: Procedure(s) (LRB): TRANSESOPHAGEAL ECHOCARDIOGRAM (TEE) (N/A) CARDIOVERSION (N/A)  Patient location during evaluation: PACU Anesthesia Type: General Level of consciousness: awake and alert and oriented Pain management: pain level controlled Vital Signs Assessment: post-procedure vital signs reviewed and stable Respiratory status: spontaneous breathing, nonlabored ventilation and respiratory function stable Cardiovascular status: blood pressure returned to baseline and stable Postop Assessment: no signs of nausea or vomiting Anesthetic complications: no       Last Vitals:  Vitals:   08/01/16 1031 08/01/16 1142  BP: 114/69 (!) 104/50  Pulse: 94 70  Resp: 18 15  Temp: 37.1 C     Last Pain:  Vitals:   08/01/16 1142  TempSrc: Oral  PainSc:                  Jewels Langone A.

## 2016-08-01 NOTE — Progress Notes (Signed)
Pt transported to Endo for scheduled TEE/cardioversion.  Informed consent obtained prior to departure.  Pt denied any questions or concerns.

## 2016-08-01 NOTE — Consult Note (Signed)
[facility] CM Primary Care Navigator  08/01/2016  Leslie [name] 07/19/1945 [id number]    Met with patientat the bedside to identify possible discharge needs. Patientreports having palpitationswith heart rate at 140's and still with irregular heartbeats when waking up the following day. She went to see her PCP and was found to be in A-fib so she was transported to [place] which had led to this admission. Patient endorses Dr. Donna Gates with [facility] as her primary care provider.   Patientshared using [facility] [place] to obtain medications without any problem.   Patient reports managing herown medications at home straight out of the containers.  She states being able to drive prior to admission and hopes to do the same when she discharge home. Patient mentions having family and friends who can providetransportation to her doctors'appointmentsif needed.   Patient lives alone and is independent with self care. She verbalized that friends and family will be able to assist with care at home as needed.   Patient anticipates to be discharged home when ready.  Patientvoiced understanding to call primary care provider's officewhen she gets home,for a post discharge follow-up appointment within a week or sooner if needs arise.Patient letter (with PCP's contact number) wasprovided as a reminder.  Patient communicated no further health management needs or concerns at this time.  [facility] care management contact information was provided for future needs that she may have.  For questions, please contact:  Lorraine Ajel, BSN, RN- [facility] Primary Care Navigator  Telephone: [phone] [facility] 

## 2016-08-02 ENCOUNTER — Telehealth: Payer: Self-pay | Admitting: Cardiovascular Disease

## 2016-08-02 MED ORDER — FLUTICASONE PROPIONATE HFA 44 MCG/ACT IN AERO: 2.0000 | INHALATION_SPRAY | Freq: Two times a day (BID) | RESPIRATORY_TRACT | 12 refills | Status: DC

## 2016-08-02 MED ORDER — METOPROLOL SUCCINATE ER 100 MG PO TB24: 250.0000 mg | ORAL_TABLET | Freq: Every day | ORAL | Status: DC

## 2016-08-02 MED ORDER — APIXABAN 5 MG PO TABS: 5.0000 mg | ORAL_TABLET | Freq: Two times a day (BID) | ORAL | 10 refills | Status: DC

## 2016-08-02 MED ORDER — APIXABAN 5 MG PO TABS: 5.0000 mg | ORAL_TABLET | Freq: Two times a day (BID) | ORAL | 0 refills | Status: DC

## 2016-08-02 MED ORDER — VERAPAMIL HCL ER 180 MG PO TBCR: 360.0000 mg | EXTENDED_RELEASE_TABLET | Freq: Every morning | ORAL | 5 refills | Status: DC

## 2016-08-02 MED ORDER — METOPROLOL SUCCINATE ER 100 MG PO TB24: 200.0000 mg | ORAL_TABLET | Freq: Every day | ORAL | Status: DC

## 2016-08-02 MED ORDER — METOPROLOL SUCCINATE ER 50 MG PO TB24: 50.0000 mg | ORAL_TABLET | Freq: Every day | ORAL | Status: DC

## 2016-08-02 MED ORDER — MAGNESIUM OXIDE 400 (241.3 MG) MG PO TABS: 400.0000 mg | ORAL_TABLET | Freq: Every day | ORAL | 5 refills | Status: DC

## 2016-08-02 MED ORDER — METOPROLOL SUCCINATE ER 100 MG PO TB24: ORAL_TABLET | ORAL | 5 refills | Status: DC

## 2016-08-02 NOTE — Progress Notes (Signed)
Progress Note  Patient Name: Leslie [name] Date of Encounter: 08/02/2016  Primary Cardiologist: Dr. Acie Fredrickson   Subjective   Up and out of bed eating breakfast. Feels better today. No palpitations, CP or dyspnea.   Inpatient Medications    Scheduled Meds: . apixaban  5 mg Oral BID  . atorvastatin  10 mg Oral Daily  . fluticasone  2 spray Each Nare Daily  . furosemide  20 mg Oral q morning - 10a  . irbesartan  300 mg Oral Daily  . levothyroxine  50 mcg Oral QAC breakfast  . loratadine  10 mg Oral Daily  . magnesium oxide  400 mg Oral Daily  . metoprolol succinate  250 mg Oral q morning - 10a  . pantoprazole  40 mg Oral Daily  . verapamil  360 mg Oral q morning - 10a   Continuous Infusions:  PRN Meds: acetaminophen, ondansetron (ZOFRAN) IV, ondansetron (ZOFRAN) IV   Vital Signs    Vitals:   08/01/16 1142 08/01/16 1242 08/01/16 2057 08/02/16 0502  BP: (!) 104/50 108/65 120/73 (!) 130/55  Pulse: 70 72 83 85  Resp: 15 16    Temp:  98.6 F (37 C) 97.3 F (36.3 C) 98.4 F (36.9 C)  TempSrc: Oral Oral Axillary Oral  SpO2: 95% 99% 97% 96%  Weight:    285 lb 14.4 oz (129.7 kg)  Height:        Intake/Output Summary (Last 24 hours) at 08/02/16 0754 Last data filed at 08/01/16 2100  Gross per 24 hour  Intake             1022 ml  Output                0 ml  Net             1022 ml   Filed Weights   07/30/16 2239 08/01/16 0433 08/02/16 0502  Weight: 291 lb 1.6 oz (132 kg) 292 lb 8 oz (132.7 kg) 285 lb 14.4 oz (129.7 kg)    Telemetry    NSR 90s - Personally Reviewed  ECG    Post cardioversion EKG not scanned into Epic, but NST on telemetry- Personally Reviewed  Physical Exam   GEN: No acute distress. Obese    Neck: No JVD Cardiac: RRR, no murmurs, rubs, or gallops.  Respiratory: Clear to auscultation bilaterally. GI: Soft, nontender, non-distended  MS: No edema; No deformity. Neuro:  Nonfocal  Psych: Normal affect   Labs    Chemistry Recent  Labs Lab 07/30/16 1355 07/31/16 0435  NA 140 140  K 3.7 3.2*  CL 106 105  CO2 21* 26  GLUCOSE 101* 109*  BUN 15 11  CREATININE 0.93 1.04*  CALCIUM 8.8* 8.3*  GFRNONAA >60 53*  GFRAA >60 >60  ANIONGAP 13 9     Hematology Recent Labs Lab 07/30/16 1355 07/31/16 0435  WBC 13.8* 12.0*  RBC 5.38* 4.91  HGB 14.8 13.4  HCT 44.8 41.2  MCV 83.3 83.9  MCH 27.5 27.3  MCHC 33.0 32.5  RDW 15.0 15.0  PLT 266 266    Cardiac EnzymesNo results for input(s): TROPONINI in the last 168 hours. No results for input(s): TROPIPOC in the last 168 hours.   BNPNo results for input(s): BNP, PROBNP in the last 168 hours.   DDimer No results for input(s): DDIMER in the last 168 hours.   Radiology    No results found.  Cardiac Studies   2D Echo 07/31/16  Study  Conclusions  - Left ventricle: The cavity size was normal. There was moderate concentric hypertrophy. Systolic function was normal. The estimated ejection fraction was in the range of 55% to 60%. Wall motion was normal; there were no regional wall motion abnormalities. - Aortic valve: There was moderate regurgitation. - Mitral valve: There was mild to moderate regurgitation. - Left atrium: The atrium was mildly dilated. Anterior-posterior dimension: 43 mm. - Pulmonic valve: There was mild regurgitation.  TEE w/ Cardioversion 08/01/16 Findings:  - Normal EF 50-55% - Mild MR - Moderate AI - Mild to moderate TR - Negative bubble study  Successful cardioversion, 120J X 1  Patient Profile     Leslie [name] a 71 y.o. femalewith a history of HTN, HLD, morbid obesity s/p lap band surgery, GERD, OSA on CPAP and asthma who presented to [facility] ED on 07/20/16 from PCP office with afib with RVR.   Assessment & Plan    1. Atrial Fibrillation: s/p successful TEE guided DCCV, 120J x 1 yesterday. Maintaining NSR on telemetry. Rate is currently controlled. She is very hard to rate control, when in afib. If persistent  atrial fibrillation recurs, we will have to pursue a rhythm control strategy w/ antiarrhythmics (tikosyn?). Weight loss and aggressive treatment of OSA and HTN also imperative. Continue Eliquis for a/c given CHA2DS2 VASc score of 3 (HTN, Age 10-74 and female sex). Continue metoprolol and verapamil.   2. Aortic Insufficiency: Moderate based on TEE assessment. Trileaflet valve with normal aortic root size. Currently asymptomatic. This will need to be closely monitored in the outpatient setting. May ultimately need surgery if progression to severe AI.   3. Mitral Regurgitation: mild to moderate by TEE. Continue to monitor.   4. HTN: stable and controlled on current regimen.  130/55 is most recent BP. Continue BB, CCB and ARB.   5.  Hypothyroidism: TSH normal. Continue synthroid. PCP to continue to manage.   6.  HLD: continue statin, Lipitor 10 mg. FLP shows LDL to be at goal of <100 at 79 mg/dL. She has no known h/o CAD.  7.  OSA on CPAP: reports full compliance. Continue.   8. Asthma: she is on Symbicort and albuterol, which can worsen afib. Discussed not overusing these. Consider switch from albuterol to xopenex to reduce risk for tachycardia.   9.  Morbid obesity: Body mass index is 46.28 kg/m.Discussed the relationship between obesity and afib and encouraged weight loss.   Dispo: likely d/c home today. Needs clinic f/u in 1-2 weeks with APP for post hospital w/ repeat EKG.    Signed, Lyda Jester, PA-C  08/02/2016, 7:54 AM     I have seen and examined the patient along with Lyda Jester, PA-C .  I have reviewed the chart, notes and new data.  I agree with PA's note.  Key new complaints: feels well Key examination changes: maintained NSR overnight, rare PACs, no signs CHF  PLAN: Stop Symbicort and use equivalent dose of inhaled fluticasone only. Critically important not to stop anticoagulation for next 30 day. F/U in 1-2 weeks. Long term weight loss is important  to lessen prevalence of arrhythmia.  Sanda Klein, MD, [place] [phone] 08/02/2016, 8:56 AM

## 2016-08-02 NOTE — Telephone Encounter (Signed)
Patient just discharged from hospital today 08-02-16. Patient states that she is extremely dizzy and has low BP. Patient also states that her medications were increased and is concerned that maybe the higher dosage is causing her BP to drop.  Pt c/o BP issue: STAT if pt c/o blurred vision, one-sided weakness or slurred speech  1. What are your last 5 BP readings? Today 85/57 and heart rate 63  2. Are you having any other symptoms (ex. Dizziness, headache, blurred vision, passed out)? Extremely dizzy  3. What is your BP issue? Low BP

## 2016-08-02 NOTE — Telephone Encounter (Signed)
Returned the phone call to the patient. She stated that she got discharged from the hospital today for Afib RVR and had a cardioversion done yesterday where she converted to normal sinus rhythm. She stated that she felt dizzy when she got home and took her blood pressure with a reading of 85/57 and heart rate of 63. She stated that she did feel better at the time of the phone call. She was asked to retake her blood pressure and it was 123/74 with a heart rate of 72. She was given instructions to take her blood pressure twice a day, record it and any symptoms she may be feeling. She was also informed that she may call the on call physician if she need anything over the weekned. She verbalized her understanding and again stated that she was now feeling better.

## 2016-08-02 NOTE — Progress Notes (Signed)
Pt in stable condition. Discharged home via wheelchair to private vehicle with her son. No complaints, I reviewed discharged instructions and pt didn't have any further questions

## 2016-08-02 NOTE — Care Management Note (Signed)
Case Management Note  Patient Details  Name: Leslie [name] MRN: [id number] Date of Birth: 1945/08/23  Subjective/Objective:  Pt presented for A fib post cardioversion. Pt is from home and the plan will be to return home 08-02-16.                   Action/Plan: Benefits Check completed for Eliquis and pt is aware of co pay. Pt has 30 day free card.  No further needs from CM at this time.    S/W [name] @ [facility] # [phone]   APIXABAN ( ELIQUIS ) 5 MG BID   COVER- YES  CO-PAY- $ 83.55  TIER- 3 DRUG  PRIOR APPROVAL- NO   PH[facility]ACY : [facility] Expected Discharge Date:  08/02/16               Expected Discharge Plan:  Home/Self Care  In-House Referral:  NA  Discharge planning Services  CM Consult, Medication Assistance  Post Acute Care Choice:  NA Choice offered to:  NA  DME Arranged:  N/A DME Agency:  NA  HH Arranged:  NA HH Agency:  NA  Status of Service:  Completed, signed off  If discussed at [place] of Stay Meetings, dates discussed:    Additional Comments:  Bethena Roys, RN 08/02/2016, 12:01 PM

## 2016-08-02 NOTE — Discharge Summary (Signed)
   Discharge Summary    Patient ID: Leslie [name],  MRN: [id number], DOB/AGE: 07/16/1945 71 y.o.  Admit date: 07/30/2016 Discharge date: 08/02/2016  Primary Care Provider: GATES,DONNA RUTH Primary Cardiologist: Dr. Nahser   Discharge Diagnoses    Principal Problem:   Atrial fibrillation with rapid ventricular response (HCC) Active Problems:   Essential hypertension, benign   Pure hypercholesterolemia   Obesity, unspecified   Hypothyroidism   Mitral valve insufficiency   Aortic valve regurgitation   Allergies Allergies  Allergen Reactions  . Augmentin [Amoxicillin-Pot Clavulanate] Diarrhea  . Codeine Nausea Only and Other (See Comments)    Hyperactivity (also)   . Demerol [Meperidine] Nausea And Vomiting  . Sulfa Antibiotics Rash    Diagnostic Studies/Procedures    2D Echo 07/31/16  Study Conclusions  - Left ventricle: The cavity size was normal. There was moderate concentric hypertrophy. Systolic function was normal. The estimated ejection fraction was in the range of 55% to 60%. Wall motion was normal; there were no regional wall motion abnormalities. - Aortic valve: There was moderate regurgitation. - Mitral valve: There was mild to moderate regurgitation. - Left atrium: The atrium was mildly dilated. Anterior-posterior dimension: 43 mm. - Pulmonic valve: There was mild regurgitation.  TEE w/ Cardioversion 08/01/16 Findings:  - Normal EF 50-55% - Mild MR - Moderate AI - Mild to moderate TR - Negative bubble study  Successful cardioversion, 120J X 1   History of Present Illness      Leslie [name] a 71 y.o. femalewith a history of HTN, HLD, morbid obesity s/p lap band surgery, GERD, OSA on CPAP and asthma who presented to [facility] ED on 07/20/16 from PCP office with new onset afib with RVR.   Hospital Course     On arrival to the ED, she was given IV diltiazem without significant improvement in heart rates. This was then followed by  IV Lopressor 10 mg, with improvement of heart rates into 80s and 90s. She was admitted to telemetry for further management of her atrial fibrillation. TSH was normal. K normal. Mg was low at 1.3 but improved to normal range with supplementation. She denied anginal symptoms. 2D echo showed normal LVEF at 55-60% with normal wall motion. She was also noted to have moderate AI and mild to moderate MR on echo. Her CHA2DS2 VASc score was calculated at 3 (HTN, Age 65-74 and female sex). She was started on NOAC, Eliquis 5 mg BID. She continued to have difficult to control afib despite increasing doses of AV nodal blocking agents with ventricular rates in the 120s-130s. She required TEE guided DCCV. This was performed 08/01/16 by Dr. Skains. TEE showed normal LVEF at 50-55%, mild MR, moderate MI, mild to moderate TR, negative bubble study and negative for LA thrombus. This was followed by successful cardioversion at 120J x1. NSR restored. She tolerated the procedure well. She was monitored and had no recurrent afib on telemetry. HR remained stable. She was last seen and examined by Dr. Croitoru, who determined she was stable for discharge home. She was continued on metoprolol and verapamil for rate control and Eliquis for anticoagulation. There was concern that her Symbicort could exacerbate/ trigger recurrence of her atrial arrhthymia. This was discontinued and replaced with fluticasone. She was advised to continue regular use of CPAP for OSA and weight loss was encouraged. Post hospital f/u will be arranged in 1-2 weeks with an APP. She will require a repeat EKG. She will be followed long term by Dr.   Nahser.    Consultants: none    Discharge Vitals Blood pressure (!) 136/96, pulse 92, temperature 98.4 F (36.9 C), temperature source Oral, resp. rate 16, height 5' 6.5" (1.689 m), weight 285 lb 14.4 oz (129.7 kg), SpO2 96 %.  Filed Weights   07/30/16 2239 08/01/16 0433 08/02/16 0502  Weight: 291 lb 1.6 oz (132 kg)  292 lb 8 oz (132.7 kg) 285 lb 14.4 oz (129.7 kg)    Labs & Radiologic Studies    CBC  Recent Labs  07/30/16 1355 07/31/16 0435  WBC 13.8* 12.0*  HGB 14.8 13.4  HCT 44.8 41.2  MCV 83.3 83.9  PLT 266 266   Basic Metabolic Panel  Recent Labs  07/30/16 1355 07/30/16 1530 07/31/16 0435  NA 140  --  140  K 3.7  --  3.2*  CL 106  --  105  CO2 21*  --  26  GLUCOSE 101*  --  109*  BUN 15  --  11  CREATININE 0.93  --  1.04*  CALCIUM 8.8*  --  8.3*  MG  --  1.3* 1.9   Liver Function Tests No results for input(s): AST, ALT, ALKPHOS, BILITOT, PROT, ALBUMIN in the last 72 hours. No results for input(s): LIPASE, AMYLASE in the last 72 hours. Cardiac Enzymes No results for input(s): CKTOTAL, CKMB, CKMBINDEX, TROPONINI in the last 72 hours. BNP Invalid input(s): POCBNP D-Dimer No results for input(s): DDIMER in the last 72 hours. Hemoglobin A1C  Recent Labs  07/31/16 0435  HGBA1C 5.6   Fasting Lipid Panel  Recent Labs  07/31/16 0435  CHOL 146  HDL 32*  LDLCALC 79  TRIG 174*  CHOLHDL 4.6   Thyroid Function Tests  Recent Labs  07/30/16 1530  TSH 2.513   _____________  Dg Chest 2 View  Result Date: 07/30/2016 CLINICAL DATA:  71-year-old female with new onset of atrial fibrillation. Shortness of breath and nausea for 1 day. EXAM: CHEST  2 VIEW COMPARISON:  Chest x-ray 01/29/2012. FINDINGS: Lung volumes are normal. No consolidative airspace disease. No pleural effusions. No evidence of pulmonary edema. No pneumothorax. Heart size is mildly enlarged. Upper mediastinal contours are within normal limits. Aortic atherosclerosis. IMPRESSION: 1. No radiographic evidence of acute cardiopulmonary disease. 2. Mild cardiomegaly. 3. Aortic atherosclerosis. Electronically Signed   By: Daniel  Entrikin M.D.   On: 07/30/2016 15:05   Disposition   Pt is being discharged home today in good condition.  Follow-up Plans & Appointments    Follow-up Information    Philip Nahser,  MD Follow up.   Specialty:  Cardiology Why:  our office will call you with an appointment  Contact information: [address] [place] [address] [phone]          Discharge Instructions    Diet - low sodium heart healthy    Complete by:  As directed    Increase activity slowly    Complete by:  As directed       Discharge Medications   Current Discharge Medication List    START taking these medications   Details  !! apixaban (ELIQUIS) 5 MG TABS tablet Take 1 tablet (5 mg total) by mouth 2 (two) times daily. Qty: 60 tablet, Refills: 10    !! apixaban (ELIQUIS) 5 MG TABS tablet Take 1 tablet (5 mg total) by mouth 2 (two) times daily. Qty: 60 tablet, Refills: 0    fluticasone (FLOVENT HFA) 44 MCG/ACT inhaler Inhale 2 puffs into the   lungs 2 (two) times daily. Qty: 1 Inhaler, Refills: 12    magnesium oxide (MAG-OX) 400 (241.3 Mg) MG tablet Take 1 tablet (400 mg total) by mouth daily. Qty: 30 tablet, Refills: 5     !! - Potential duplicate medications found. Please discuss with provider.    CONTINUE these medications which have CHANGED   Details  metoprolol succinate (TOPROL-XL) 100 MG 24 hr tablet Take 250 mg by mouth every morning (2.5 tablets) Qty: 75 tablet, Refills: 5    verapamil (CALAN-SR) 180 MG CR tablet Take 2 tablets (360 mg total) by mouth every morning. Qty: 60 tablet, Refills: 5      CONTINUE these medications which have NOT CHANGED   Details  acetaminophen (TYLENOL) 500 MG tablet Take 500-1,000 mg by mouth every 6 (six) hours as needed (for pain).    albuterol (PROVENTIL HFA;VENTOLIN HFA) 108 (90 BASE) MCG/ACT inhaler Inhale 1-2 puffs into the lungs every 6 (six) hours as needed for wheezing or shortness of breath.    atorvastatin (LIPITOR) 10 MG tablet Take 10 mg by mouth daily.    Azelaic Acid-Cleanser-Lotion (FINACEA PLUS) 15 % KIT Apply 1 application topically daily.     cholecalciferol (VITAMIN D) 1000 UNITS tablet Take  1,000 Units by mouth daily.    fexofenadine (ALLEGRA) 180 MG tablet Take 180 mg by mouth daily.    fluticasone (FLONASE) 50 MCG/ACT nasal spray Place 2 sprays into both nostrils daily.    furosemide (LASIX) 20 MG tablet Take 20 mg by mouth every morning.    Glucosamine-Chondroitin (OSTEO BI-FLEX REGULAR STRENGTH PO) Take 1 tablet by mouth daily.    levothyroxine (SYNTHROID, LEVOTHROID) 50 MCG tablet Take 50 mcg by mouth daily before breakfast.    Multiple Vitamins-Minerals (CENTRUM SILVER ADULT 50+ PO) Take 1 tablet by mouth daily.     omeprazole (PRILOSEC) 20 MG capsule Take 40 mg by mouth daily before breakfast.    valsartan (DIOVAN) 320 MG tablet Take 320 mg by mouth daily.    Vitamin D, Ergocalciferol, (DRISDOL) 50000 UNITS CAPS capsule Take 50,000 Units by mouth every 7 (seven) days.      STOP taking these medications     SYMBICORT 160-4.5 MCG/ACT inhaler          Outstanding Labs/Studies   Repeat EKG at office f/u   Duration of Discharge Encounter   Greater than 30 minutes including physician time.  Signed, Lyda Jester PA-C 08/02/2016, 10:36 AM

## 2016-08-06 ENCOUNTER — Telehealth: Payer: Self-pay | Admitting: Cardiovascular Disease

## 2016-08-06 MED ORDER — VALSARTAN 160 MG PO TABS: 160.0000 mg | ORAL_TABLET | Freq: Every day | ORAL | 1 refills | Status: DC

## 2016-08-06 NOTE — Telephone Encounter (Signed)
Please cut the dose of valsartan down to 160 mg daily. Will take a week or 2 for the full impact of this change to show in her blood pressure. If blood pressure is still low and causing dizziness, can reduce further at that time. MCr

## 2016-08-06 NOTE — Telephone Encounter (Signed)
New Message  Pt c/o BP issue: STAT if pt c/o blurred vision, one-sided weakness or slurred speech  1. What are your last 5 BP readings? Sat: 130/92 84, 121/86 87, (230pm mid afternoon-94/61 69), 106/72 65  Sun: 127/88 79 10am, 127/88 79, (mid-afternoon197/72 62), 119/80 62, 128/79 69 Mon. 129/81 81, (mid-afternoon-98/68 52), 124/84 69.  2. Are you having any other symptoms (ex. Dizziness, headache, blurred vision, passed out)? A lilttle blurred vision, dizzy and lightheaded no energy.  3. What is your BP issue? BP has been dropping mid afternoon, doubled one of her medications and may want to adjust her medications

## 2016-08-06 NOTE — Telephone Encounter (Signed)
Recommendations discussed w patient. Med list updated. Pt aware to continue to check BP until seen, further titration of meds to be done at appt, if advised. She voiced understanding, agreement, and thanks.

## 2016-08-06 NOTE — Telephone Encounter (Signed)
Pt of Dr. Sallyanne Kuster Recent seen in hosp Has a f/u w Ignacia Bayley on 5/1 She's calling to report that she has been having some dizziness, blurring vision, and lightheadedness, coinciding routinely with low BP readings in the afternoons. This has been going on since leaving hospital. Shares that her midafternoon reading yesterday was 98/68 and on other days has typically been running about this range. At most other times of day she is running in the 120s/80s.   Notes recent increase in verapamil (dose doubled in hospital) and wonders if she would be able to scale back on this, or one of her other meds.   Will route to Dr. Sallyanne Kuster to advise   Patient also wanted to commend the fantastic care she received from Dr. Sallyanne Kuster and other providers in the hospital.

## 2016-08-12 DIAGNOSIS — M25512 Pain in left shoulder: Secondary | ICD-10-CM | POA: Diagnosis not present

## 2016-08-12 DIAGNOSIS — M170 Bilateral primary osteoarthritis of knee: Secondary | ICD-10-CM | POA: Diagnosis not present

## 2016-08-13 ENCOUNTER — Encounter: Payer: Self-pay | Admitting: Nurse Practitioner

## 2016-08-13 ENCOUNTER — Ambulatory Visit (INDEPENDENT_AMBULATORY_CARE_PROVIDER_SITE_OTHER): Payer: Medicare Other | Admitting: Nurse Practitioner

## 2016-08-13 VITALS — BP 92/64 | HR 59 | Ht 66.5 in | Wt 288.4 lb

## 2016-08-13 DIAGNOSIS — E876 Hypokalemia: Secondary | ICD-10-CM

## 2016-08-13 DIAGNOSIS — I4891 Unspecified atrial fibrillation: Secondary | ICD-10-CM | POA: Diagnosis not present

## 2016-08-13 DIAGNOSIS — I10 Essential (primary) hypertension: Secondary | ICD-10-CM | POA: Diagnosis not present

## 2016-08-13 LAB — BASIC METABOLIC PANEL WITH GFR
BUN: 28 mg/dL — AB (ref 7–25)
CHLORIDE: 103 mmol/L (ref 98–110)
CO2: 23 mmol/L (ref 20–31)
Calcium: 8.6 mg/dL (ref 8.6–10.4)
Creat: 1.49 mg/dL — ABNORMAL HIGH (ref 0.60–0.93)
GFR, Est African American: 41 mL/min — ABNORMAL LOW (ref >=60)
GFR, Est Non African American: 35 mL/min — ABNORMAL LOW (ref >=60)
Glucose, Bld: 101 mg/dL — ABNORMAL HIGH (ref 65–99)
POTASSIUM: 4.4 mmol/L (ref 3.5–5.3)
Sodium: 139 mmol/L (ref 135–146)

## 2016-08-13 LAB — MAGNESIUM: MAGNESIUM: 1.8 mg/dL (ref 1.5–2.5)

## 2016-08-13 LAB — CBC WITH DIFFERENTIAL/PLATELET
Basophils Absolute: 0 cells/uL (ref 0–200)
Basophils Relative: 0 %
EOS ABS: 0 {cells}/uL — AB (ref 15–500)
Eosinophils Relative: 0 %
HEMATOCRIT: 43.3 % (ref 35.0–45.0)
HEMOGLOBIN: 14.3 g/dL (ref 11.7–15.5)
LYMPHS ABS: 2639 {cells}/uL (ref 850–3900)
Lymphocytes Relative: 13 %
MCH: 27.6 pg (ref 27.0–33.0)
MCHC: 33 g/dL (ref 32.0–36.0)
MCV: 83.6 fL (ref 80.0–100.0)
MONO ABS: 1015 {cells}/uL — AB (ref 200–950)
MPV: 10 fL (ref 7.5–12.5)
Monocytes Relative: 5 %
NEUTROS PCT: 82 %
Neutro Abs: 16646 cells/uL — ABNORMAL HIGH (ref 1500–7800)
Platelets: 389 10*3/uL (ref 140–400)
RBC: 5.18 MIL/uL — AB (ref 3.80–5.10)
RDW: 15 % (ref 11.0–15.0)
WBC: 20.3 10*3/uL — AB (ref 3.8–10.8)

## 2016-08-13 MED ORDER — METOPROLOL SUCCINATE ER 100 MG PO TB24: 100.0000 mg | ORAL_TABLET | Freq: Every day | ORAL | 3 refills | Status: DC

## 2016-08-13 NOTE — Progress Notes (Signed)
Office Visit    Patient Name: Leslie [name] Date of Encounter: 08/13/2016  Primary Care Provider:  Marjorie Smolder, MD Primary Cardiologist:  Jerilynn Mages. Croitoru, MD   Chief Complaint    71 year old female with a prior history of hypertension, hyperlipidemia, obesity, sleep apnea, and recent admission for atrial fibrillation who presents for follow-up after TEE and cardioversion.  Past Medical History    Past Medical History:  Diagnosis Date  . Adenomatous polyp   . Arthritis    "knees, legs, fingers" (07/30/2016)  . Asthma   . Bursitis of left shoulder    "just finished PT" (07/30/2016)  . Chest pain    a. 2003 Abnl stress test-->Cath: nonobs CAD.  Marland Kitchen Chronic bronchitis ([place])   . Chronic kidney disease    stage 3  . GERD (gastroesophageal reflux disease)    barrett's esophagus- Dr. Cristina Gong  . History of hiatal hernia   . Hyperlipidemia   . Hypertension   . Hypothyroidism   . Migraines    "sporatic; at least a few/year" (07/30/2016)  . Mitral regurgitation    a. 07/2016 Echo: mild to mod MR;  b. 07/2016 TEE: mild MR.  . Moderate aortic insufficiency    a. 07/2016 Echo: EF 55-60%, mod AI;  b. 07/2016 TEE: EF 50-55%, mod AI.  Marland Kitchen Obesity    s/p lap band surgery 6/10  . OSA on CPAP   . PAF (paroxysmal atrial fibrillation) ([place])    a. 07/2016 TEE/DCCV: EF 50-55%, mild MR, mod AI, mild to mod TR, neg bubble study-->successful DCCV x1 w/ 120J.  Marland Kitchen PONV (postoperative nausea and vomiting)   . Vitamin D deficiency    Past Surgical History:  Procedure Laterality Date  . APPENDECTOMY     incidental  . CARDIOVERSION N/A 08/01/2016   Procedure: CARDIOVERSION;  Surgeon: Jerline Pain, MD;  Location: [place];  Service: Cardiovascular;  Laterality: N/A;  . CATARACT EXTRACTION W/ INTRAOCULAR LENS  IMPLANT, BILATERAL Bilateral ~ 2008-2017   left - right  . COMBINED HYSTERECTOMY VAGINAL / OOPHORECTOMY / A&P REPAIR  1981  . HERNIA REPAIR    . INGUINAL HERNIA REPAIR Left 1952  .  LAPAROSCOPIC GASTRIC BANDING WITH HIATAL HERNIA REPAIR  09/2008  . TEE WITHOUT CARDIOVERSION N/A 08/01/2016   Procedure: TRANSESOPHAGEAL ECHOCARDIOGRAM (TEE);  Surgeon: Jerline Pain, MD;  Location: [place];  Service: Cardiovascular;  Laterality: N/A;  . [place]  . VAGINAL HYSTERECTOMY  1981   "w/1 ovary"    Allergies  Allergies  Allergen Reactions  . Augmentin [Amoxicillin-Pot Clavulanate] Diarrhea  . Codeine Nausea Only and Other (See Comments)    Hyperactivity (also)   . Demerol [Meperidine] Nausea And Vomiting  . Sulfa Antibiotics Rash    History of Present Illness    71 year old female with the above past medical history including remote cardiac catheterization revealing nonobstructive CAD, hypertension, hyperlipidemia, obesity, sleep apnea on CPAP, and hypothyroidism. She was recently admitted to [facility] with A. fib and RVR. She was unable to be adequately rate controlled and as result she underwent TEE and cardioversion during that admission. She was placed on eliquis 5 mg twice a day in the setting of a CHA2DS2VASc of 3.   Since discharge, she has not had any recurrent palpitations but has noted fatigue in the afternoons.  She has been checking her bp regularly and notes that a few x/wk, bp drops and when that occurs, she feels very fatigued and tired.  She  has not had chest pain, dyspnea, palpitations, pnd, orthopnea, n, v, dizziness, syncope, edema, or early satiety.  Home Medications    Prior to Admission medications   Medication Sig Start Date End Date Taking? Authorizing Provider  acetaminophen (TYLENOL) 500 MG tablet Take 500-1,000 mg by mouth every 6 (six) hours as needed (for pain).   Yes Historical Provider, MD  albuterol (PROVENTIL HFA;VENTOLIN HFA) 108 (90 BASE) MCG/ACT inhaler Inhale 1-2 puffs into the lungs every 6 (six) hours as needed for wheezing or shortness of breath.   Yes Historical Provider, MD  apixaban (ELIQUIS) 5 MG  TABS tablet Take 1 tablet (5 mg total) by mouth 2 (two) times daily. 08/02/16  Yes Brittainy Erie Noe, PA-C  atorvastatin (LIPITOR) 10 MG tablet Take 10 mg by mouth daily.   Yes Historical Provider, MD  Azelaic Acid-Cleanser-Lotion (FINACEA PLUS) 15 % KIT Apply 1 application topically daily.    Yes Historical Provider, MD  cholecalciferol (VITAMIN D) 1000 UNITS tablet Take 1,000 Units by mouth daily.   Yes Historical Provider, MD  fexofenadine (ALLEGRA) 180 MG tablet Take 180 mg by mouth daily.   Yes Historical Provider, MD  fluticasone (FLONASE) 50 MCG/ACT nasal spray Place 2 sprays into both nostrils daily.   Yes Historical Provider, MD  fluticasone (FLOVENT HFA) 44 MCG/ACT inhaler Inhale 2 puffs into the lungs 2 (two) times daily. 08/02/16  Yes Brittainy Erie Noe, PA-C  furosemide (LASIX) 20 MG tablet Take 20 mg by mouth every morning. 05/20/16  Yes Historical Provider, MD  Glucosamine-Chondroitin (OSTEO BI-FLEX REGULAR STRENGTH PO) Take 1 tablet by mouth daily.   Yes Historical Provider, MD  levothyroxine (SYNTHROID, LEVOTHROID) 50 MCG tablet Take 50 mcg by mouth daily before breakfast.   Yes Historical Provider, MD  magnesium oxide (MAG-OX) 400 (241.3 Mg) MG tablet Take 1 tablet (400 mg total) by mouth daily. 08/03/16  Yes Brittainy Erie Noe, PA-C  metoprolol succinate (TOPROL-XL) 100 MG 24 hr tablet Take 250 mg by mouth every morning (2.5 tablets) 08/02/16  Yes Brittainy Erie Noe, PA-C  Multiple Vitamins-Minerals (CENTRUM SILVER ADULT 50+ PO) Take 1 tablet by mouth daily.    Yes Historical Provider, MD  omeprazole (PRILOSEC) 20 MG capsule Take 40 mg by mouth daily before breakfast. 07/27/16  Yes Historical Provider, MD  valsartan (DIOVAN) 160 MG tablet Take 1 tablet (160 mg total) by mouth daily. 08/06/16  Yes Mihai Croitoru, MD  verapamil (CALAN-SR) 180 MG CR tablet Take 2 tablets (360 mg total) by mouth every morning. 08/02/16  Yes Brittainy Erie Noe, PA-C  Vitamin D, Ergocalciferol, (DRISDOL)  50000 UNITS CAPS capsule Take 50,000 Units by mouth every 7 (seven) days.   Yes Historical Provider, MD    Review of Systems    PM fatigue in the setting of low bp's.  She denies chest pain, palpitations, dyspnea, pnd, orthopnea, n, v, dizziness, syncope, edema, weight gain, or early satiety.  All other systems reviewed and are otherwise negative except as noted above.  Physical Exam    VS:  Ht 5' 6.5" (1.689 m)   Wt 288 lb 6.4 oz (130.8 kg)   BMI 45.85 kg/m  , BMI Body mass index is 45.85 kg/m. GEN: Well nourished, well developed, in no acute distress.  HEENT: normal.  Neck: Supple, no JVD, carotid bruits, or masses. Cardiac: RRR, distant, no murmurs, rubs, or gallops. No clubbing, cyanosis, edema.  Radials/DP/PT 2+ and equal bilaterally.  Respiratory:  Respirations regular and unlabored, clear to auscultation bilaterally. GI: Soft,  nontender, nondistended, BS + x 4. MS: no deformity or atrophy. Skin: warm and dry, no rash. Neuro:  Strength and sensation are intact. Psych: Normal affect.  Accessory Clinical Findings    ECG - sinus with jxnl rhythm, 59, nonspecific t changes.  Rhythm strip performed - predominantly jxnl rhythm with rare sinus beat.  Assessment & Plan    1.  Paroxysmal atrial fibrillation/Junctional bradycardia: Status post recent admission for A. fib and RVR requiring TEE and cardioversion.  She has not had any palpitations since discharge.  ECG/rhythm strip today shows predominantly jxnl rhythm/bradycardia with occasional sinus beats.  I discussed her case with Dr. Debara Pickett today.  She is on toprol xl 250 mg daily and verapamil 360 mg daily.  I will reduce her toprol to 100 mg daily.  She will continue to follow HR/BP @ home.  She remains on eliquis 5 mg twice a day in the setting of a CHA2DS2VASc 3.  F/u cbc/bmet today.  2.  Essential HTN:  BP has been soft in the afternoons.  She is currently on toprol xl 250 mg daily, verapamil 360 mg daily, and valsartan 160 mg  daily (was on 320 daily but cut back by 1/2).  She has been having relative hypotension and significant fatigue in the afternoons associated with blood pressures in the 90's. As above, she has also been having junctional bradycardia.  I will reduce toprol to 100 mg daily.  3.  Valvular Heart Dzs (Mod AI, mild MR): Noted on echo and TEE during hospitalization.  Asymptomatic.  4. Hypokalemia/hypomagnesemia:  Noted when in hospital.  F/u today.  5.  Morbid Obesity:  She is scheduled for PT and hopes to begin exercising with a focus on wt loss.  6.  Dispo:  F/u cbc/bmet/mg today.  F/u in clinic in 1 months.  Murray Hodgkins, NP 08/13/2016, 2:49 PM

## 2016-08-13 NOTE — Patient Instructions (Signed)
Medication Instructions: Decrease Metoprolol to 100 mg daily. (1 tab per day)   Labwork: Your physician recommends that you return for lab work today: CBC and BMET   Follow-Up: Your physician recommends that you schedule a follow-up appointment in: 1 month with Kerin Ransom, PA-C or Dr. Sallyanne Kuster.  If you need a refill on your cardiac medications before your next appointment, please call your pharmacy.

## 2016-08-14 NOTE — Progress Notes (Signed)
Thanks. She was very hard to rate control. May end up w PM if she has AF w RVR again. MCr

## 2016-08-15 ENCOUNTER — Telehealth: Payer: Self-pay | Admitting: *Deleted

## 2016-08-15 DIAGNOSIS — Z79899 Other long term (current) drug therapy: Secondary | ICD-10-CM

## 2016-08-15 NOTE — Telephone Encounter (Signed)
-----   Message from Rogelia Mire, NP sent at 08/14/2016  9:01 AM EDT ----- Potassium and magnesium are fine.  Her kidney's appear a little dry - this may be related to low afternoon blood pressures but might also be related to lasix.  We adjusted her meds yesterday.  Let's plan to f/u a bmet in a week to re-eval renal fxn.  Encourage oral hydration.  Her H/H are nl.  Her WBC's are elevated.  This appears to be chronic.  She will need to f/u with primary care as if this has never been worked up before, it will need to be.

## 2016-08-15 NOTE — Telephone Encounter (Signed)
Results and recommendations discussed with patient, who verbalized understanding and thanks. She agreed to repeat labwork in 1 week at [facility]. Labs ordered. Pt's current results forwarded to Dr. Inda Merlin office at patient request for upcoming visit on the 14th.

## 2016-08-23 ENCOUNTER — Telehealth: Payer: Self-pay | Admitting: Cardiovascular Disease

## 2016-08-23 DIAGNOSIS — M170 Bilateral primary osteoarthritis of knee: Secondary | ICD-10-CM | POA: Diagnosis not present

## 2016-08-23 NOTE — Telephone Encounter (Signed)
Per MCr - cleared for phyisical therapy. Returned call to [name], left detailed message stating pt has been cleared for PT. Advised her to call back if she had any questions.

## 2016-08-23 NOTE — Telephone Encounter (Signed)
[name] physical therapist from [facility] is calling to get clearance for patient to start Lower Extremity Strengthening (rehab for knees). [name] states that this call is not urgent and you are not able to reach her, you may leave a detailed message on her voicemail at [phone]. Thanks.

## 2016-08-26 DIAGNOSIS — I480 Paroxysmal atrial fibrillation: Secondary | ICD-10-CM | POA: Diagnosis not present

## 2016-08-26 DIAGNOSIS — I10 Essential (primary) hypertension: Secondary | ICD-10-CM | POA: Diagnosis not present

## 2016-08-28 DIAGNOSIS — M170 Bilateral primary osteoarthritis of knee: Secondary | ICD-10-CM | POA: Diagnosis not present

## 2016-08-30 DIAGNOSIS — M170 Bilateral primary osteoarthritis of knee: Secondary | ICD-10-CM | POA: Diagnosis not present

## 2016-09-02 DIAGNOSIS — M170 Bilateral primary osteoarthritis of knee: Secondary | ICD-10-CM | POA: Diagnosis not present

## 2016-09-04 DIAGNOSIS — M170 Bilateral primary osteoarthritis of knee: Secondary | ICD-10-CM | POA: Diagnosis not present

## 2016-09-10 DIAGNOSIS — M170 Bilateral primary osteoarthritis of knee: Secondary | ICD-10-CM | POA: Diagnosis not present

## 2016-09-12 DIAGNOSIS — M170 Bilateral primary osteoarthritis of knee: Secondary | ICD-10-CM | POA: Diagnosis not present

## 2016-09-13 DIAGNOSIS — L570 Actinic keratosis: Secondary | ICD-10-CM | POA: Diagnosis not present

## 2016-09-13 DIAGNOSIS — L738 Other specified follicular disorders: Secondary | ICD-10-CM | POA: Diagnosis not present

## 2016-09-13 DIAGNOSIS — L821 Other seborrheic keratosis: Secondary | ICD-10-CM | POA: Diagnosis not present

## 2016-09-13 DIAGNOSIS — L820 Inflamed seborrheic keratosis: Secondary | ICD-10-CM | POA: Diagnosis not present

## 2016-09-13 DIAGNOSIS — L814 Other melanin hyperpigmentation: Secondary | ICD-10-CM | POA: Diagnosis not present

## 2016-09-13 DIAGNOSIS — D1801 Hemangioma of skin and subcutaneous tissue: Secondary | ICD-10-CM | POA: Diagnosis not present

## 2016-09-17 ENCOUNTER — Ambulatory Visit (INDEPENDENT_AMBULATORY_CARE_PROVIDER_SITE_OTHER): Payer: Medicare Other | Admitting: Cardiology

## 2016-09-17 ENCOUNTER — Encounter: Payer: Self-pay | Admitting: Cardiology

## 2016-09-17 DIAGNOSIS — Z7901 Long term (current) use of anticoagulants: Secondary | ICD-10-CM

## 2016-09-17 DIAGNOSIS — N183 Chronic kidney disease, stage 3 unspecified: Secondary | ICD-10-CM

## 2016-09-17 DIAGNOSIS — M170 Bilateral primary osteoarthritis of knee: Secondary | ICD-10-CM | POA: Diagnosis not present

## 2016-09-17 DIAGNOSIS — I10 Essential (primary) hypertension: Secondary | ICD-10-CM

## 2016-09-17 DIAGNOSIS — I4891 Unspecified atrial fibrillation: Secondary | ICD-10-CM | POA: Diagnosis not present

## 2016-09-17 NOTE — Assessment & Plan Note (Signed)
Pt presented 07/30/16 with AF with RVR. Her rate was difficult to control despite high doses of AV blocking agents. She had TEE CV 08/01/16. When seen in f/u 08/13/16 she was in junctional rhythm and her beta blocker was decreased.  F/U 09/17/16- NSR 70

## 2016-09-17 NOTE — Assessment & Plan Note (Signed)
Prio lap banding, BMI 46

## 2016-09-17 NOTE — Assessment & Plan Note (Signed)
SCr. 1.49 08/13/16, f/u SCr at Dr Inda Merlin office 08/26/16- SCr 1.0

## 2016-09-17 NOTE — Assessment & Plan Note (Signed)
She brought in a list of her B/P's from home- slightly high over the weekend but stable today. Her PCP had changed her Verapamil 360 mg daily to 180 mg BID secondary to low B/P readings.

## 2016-09-17 NOTE — Addendum Note (Signed)
Addended by: Alvina Filbert B on: 09/17/2016 05:34 PM   Modules accepted: Orders

## 2016-09-17 NOTE — Assessment & Plan Note (Signed)
CHADs VASc=3 

## 2016-09-17 NOTE — Patient Instructions (Addendum)
Medication Instructions:  Your physician recommends that you continue on your current medications as directed. Please refer to the Current Medication list given to you today.  Labwork: none  Testing/Procedures: none  Follow-Up: Keep scheduled follow up with Dr Sallyanne Kuster as scheduled   If you need a refill on your cardiac medications before your next appointment, please call your pharmacy.

## 2016-09-17 NOTE — Progress Notes (Signed)
09/17/2016 Leslie [name]   [birth year]  [id number]  Primary Physician Darcus Austin, MD Primary Cardiologist: Dr Sallyanne Kuster  HPI:  71 y/o female with a past history of HTN, OSA, morbid obesity, seen today in follow up after an office visit 08/13/16. The pt has a history of newly diagnosed AF with RVR 07/30/16. It was difficult to control despite high doses of AVN blocking agents. She had a TEE CV and was seen in f/u 08/13/16. At that time she was in junctional rhythm. Her Toprol 250 mg daily was decreased to 100 mg daily and she is here today for follow up. She feels well. She was concerned about her B/P over the weekend, she thought it was too high. Her daughter (an NP in [place]) reassured her. Her rhythm today is NSR-70 with PACs and PVCs.   Current Outpatient Prescriptions  Medication Sig Dispense Refill  . acetaminophen (TYLENOL) 500 MG tablet Take 500-1,000 mg by mouth every 6 (six) hours as needed (for pain).    Marland Kitchen albuterol (PROVENTIL HFA;VENTOLIN HFA) 108 (90 BASE) MCG/ACT inhaler Inhale 1-2 puffs into the lungs every 6 (six) hours as needed for wheezing or shortness of breath.    Marland Kitchen apixaban (ELIQUIS) 5 MG TABS tablet Take 1 tablet (5 mg total) by mouth 2 (two) times daily. 60 tablet 10  . atorvastatin (LIPITOR) 10 MG tablet Take 10 mg by mouth daily.    . Azelaic Acid-Cleanser-Lotion (FINACEA PLUS) 15 % KIT Apply 1 application topically daily.     . cholecalciferol (VITAMIN D) 1000 UNITS tablet Take 1,000 Units by mouth daily.    . fexofenadine (ALLEGRA) 180 MG tablet Take 180 mg by mouth daily.    . fluticasone (FLONASE) 50 MCG/ACT nasal spray Place 2 sprays into both nostrils daily.    . fluticasone (FLOVENT HFA) 44 MCG/ACT inhaler Inhale 2 puffs into the lungs 2 (two) times daily. 1 Inhaler 12  . furosemide (LASIX) 20 MG tablet Take 20 mg by mouth every morning.    . Glucosamine-Chondroitin (OSTEO BI-FLEX REGULAR STRENGTH PO) Take 1 tablet by mouth daily.    Marland Kitchen levothyroxine (SYNTHROID,  LEVOTHROID) 50 MCG tablet Take 50 mcg by mouth daily before breakfast.    . magnesium oxide (MAG-OX) 400 (241.3 Mg) MG tablet Take 1 tablet (400 mg total) by mouth daily. 30 tablet 5  . metoprolol succinate (TOPROL-XL) 100 MG 24 hr tablet Take 1 tablet (100 mg total) by mouth daily. 30 tablet 3  . Multiple Vitamins-Minerals (CENTRUM SILVER ADULT 50+ PO) Take 1 tablet by mouth daily.     Marland Kitchen omeprazole (PRILOSEC) 20 MG capsule Take 40 mg by mouth daily before breakfast.    . valsartan (DIOVAN) 160 MG tablet Take 1 tablet (160 mg total) by mouth daily. 90 tablet 1  . verapamil (CALAN-SR) 180 MG CR tablet Take 2 tablets (360 mg total) by mouth every morning. (Patient taking differently: Take 360 mg by mouth every morning. 1 tablet at morning. 1 tablet in the night.) 60 tablet 5  . Vitamin D, Ergocalciferol, (DRISDOL) 50000 UNITS CAPS capsule Take 50,000 Units by mouth every 7 (seven) days.     No current facility-administered medications for this visit.     Allergies  Allergen Reactions  . Augmentin [Amoxicillin-Pot Clavulanate] Diarrhea  . Codeine Nausea Only and Other (See Comments)    Hyperactivity (also)   . Demerol [Meperidine] Nausea And Vomiting  . Sulfa Antibiotics Rash    Past Medical History:  Diagnosis Date  .  Adenomatous polyp   . Arthritis    "knees, legs, fingers" (07/30/2016)  . Asthma   . Bursitis of left shoulder    "just finished PT" (07/30/2016)  . Chest pain    a. 2003 Abnl stress test-->Cath: nonobs CAD.  Marland Kitchen Chronic bronchitis ([place])   . Chronic kidney disease    stage 3  . GERD (gastroesophageal reflux disease)    barrett's esophagus- Dr. Cristina Gong  . History of hiatal hernia   . Hyperlipidemia   . Hypertension   . Hypothyroidism   . Migraines    "sporatic; at least a few/year" (07/30/2016)  . Mitral regurgitation    a. 07/2016 Echo: mild to mod MR;  b. 07/2016 TEE: mild MR.  . Moderate aortic insufficiency    a. 07/2016 Echo: EF 55-60%, mod AI;  b. 07/2016 TEE: EF  50-55%, mod AI.  Marland Kitchen Obesity    s/p lap band surgery 6/10  . OSA on CPAP   . PAF (paroxysmal atrial fibrillation) ([place])    a. 07/2016 TEE/DCCV: EF 50-55%, mild MR, mod AI, mild to mod TR, neg bubble study-->successful DCCV x1 w/ 120J.  Marland Kitchen PONV (postoperative nausea and vomiting)   . Vitamin D deficiency     Social History   Social History  . Marital status: Divorced    Spouse name: N/A  . Number of children: N/A  . Years of education: N/A   Occupational History  . Not on file.   Social History Main Topics  . Smoking status: Former Research scientist (life sciences)  . Smokeless tobacco: Never Used     Comment: 07/30/2016 "social smoker only; in college"  . Alcohol use Yes     Comment: 07/30/2016 "couple drinks/month"  . Drug use: No  . Sexual activity: Not Currently   Other Topics Concern  . Not on file   Social History Narrative  . No narrative on file     Family History  Problem Relation Age of Onset  . Heart failure Mother   . Hypertension Mother   . Hypertension Father   . Emphysema Father      Review of Systems: General: negative for chills, fever, night sweats or weight changes.  Cardiovascular: negative for chest pain, dyspnea on exertion, edema, orthopnea, palpitations, paroxysmal nocturnal dyspnea or shortness of breath Dermatological: negative for rash Respiratory: negative for cough or wheezing Urologic: negative for hematuria Abdominal: negative for nausea, vomiting, diarrhea, bright red blood per rectum, melena, or hematemesis Neurologic: negative for visual changes, syncope, or dizziness All other systems reviewed and are otherwise negative except as noted above.    Blood pressure 135/67, pulse 72, height 5' 6"  (1.676 m), weight 290 lb (131.5 kg).  General appearance: alert, cooperative, no distress and morbidly obese Lungs: clear to auscultation bilaterally Heart: regular rate and rhythm Skin: Skin color, texture, turgor normal. No rashes or lesions Neurologic: Grossly  normal  EKG NSR 70 with PAC and PVC  ASSESSMENT AND PLAN:   Atrial fibrillation with rapid ventricular response ([place]) Pt presented 07/30/16 with AF with RVR. Her rate was difficult to control despite high doses of AV blocking agents. She had TEE CV 08/01/16. When seen in f/u 08/13/16 she was in junctional rhythm and her beta blocker was decreased.  F/U 09/17/16- NSR 70  Chronic kidney disease, stage III (moderate) SCr. 1.49 08/13/16, f/u SCr at Dr Inda Merlin office 08/26/16- SCr 1.0  Essential hypertension, benign She brought in a list of her B/P's from home- slightly high over the weekend but stable  today. Her PCP had changed her Verapamil 360 mg daily to 180 mg BID secondary to low B/P readings.  Morbid obesity ([place]) Prio lap banding, BMI 46  Chronic anticoagulation CHADs VASc=3   PLAN  Same Rx. Keep f/u with Dr Sallyanne Kuster as scheduled.  NOTE- the pt's records say she had an abnormal nuclear stress in 2003 followed by a cath which showed minimal CAD- the adamantly denies ever having a cath, though she does remember having the stress test.   Kerin Ransom PA-C 09/17/2016 11:41 AM

## 2016-09-23 DIAGNOSIS — M170 Bilateral primary osteoarthritis of knee: Secondary | ICD-10-CM | POA: Diagnosis not present

## 2016-10-01 ENCOUNTER — Other Ambulatory Visit: Payer: Self-pay | Admitting: Family Medicine

## 2016-10-01 DIAGNOSIS — M238X2 Other internal derangements of left knee: Secondary | ICD-10-CM | POA: Diagnosis not present

## 2016-10-01 DIAGNOSIS — Z1231 Encounter for screening mammogram for malignant neoplasm of breast: Secondary | ICD-10-CM

## 2016-10-07 DIAGNOSIS — M23332 Other meniscus derangements, other medial meniscus, left knee: Secondary | ICD-10-CM | POA: Diagnosis not present

## 2016-10-07 DIAGNOSIS — M1712 Unilateral primary osteoarthritis, left knee: Secondary | ICD-10-CM | POA: Diagnosis not present

## 2016-10-11 DIAGNOSIS — M170 Bilateral primary osteoarthritis of knee: Secondary | ICD-10-CM | POA: Diagnosis not present

## 2016-10-17 DIAGNOSIS — M170 Bilateral primary osteoarthritis of knee: Secondary | ICD-10-CM | POA: Diagnosis not present

## 2016-10-22 DIAGNOSIS — M170 Bilateral primary osteoarthritis of knee: Secondary | ICD-10-CM | POA: Diagnosis not present

## 2016-10-25 DIAGNOSIS — M170 Bilateral primary osteoarthritis of knee: Secondary | ICD-10-CM | POA: Diagnosis not present

## 2016-11-06 ENCOUNTER — Encounter: Payer: Self-pay | Admitting: Cardiovascular Disease

## 2016-11-06 ENCOUNTER — Telehealth: Payer: Self-pay

## 2016-11-06 NOTE — Telephone Encounter (Signed)
Epicd 

## 2016-11-06 NOTE — Telephone Encounter (Signed)
1. Type of surgery: colonoscopy/endoscopy 2. Date of surgery: 12/27/16 3. Surgeon: Dr Herbie Baltimore Buccini 4. Medications that need to be held & how long: Eliquis 5 mg - hold the night before and the morning of procedure 5. Fax and/or Phone: (p) [phone] (f) [phone]   Patient has an appointment with Dr C on 11/15/16.

## 2016-11-07 NOTE — Telephone Encounter (Signed)
Clearance letter faxed via epic.

## 2016-11-15 ENCOUNTER — Telehealth: Payer: Self-pay | Admitting: Cardiovascular Disease

## 2016-11-15 ENCOUNTER — Encounter: Payer: Self-pay | Admitting: Cardiovascular Disease

## 2016-11-15 ENCOUNTER — Ambulatory Visit (INDEPENDENT_AMBULATORY_CARE_PROVIDER_SITE_OTHER): Payer: Medicare Other | Admitting: Cardiovascular Disease

## 2016-11-15 VITALS — BP 119/67 | HR 68 | Ht 66.0 in | Wt 289.4 lb

## 2016-11-15 DIAGNOSIS — I481 Persistent atrial fibrillation: Secondary | ICD-10-CM | POA: Diagnosis not present

## 2016-11-15 DIAGNOSIS — G4733 Obstructive sleep apnea (adult) (pediatric): Secondary | ICD-10-CM | POA: Diagnosis not present

## 2016-11-15 DIAGNOSIS — I340 Nonrheumatic mitral (valve) insufficiency: Secondary | ICD-10-CM | POA: Diagnosis not present

## 2016-11-15 DIAGNOSIS — I10 Essential (primary) hypertension: Secondary | ICD-10-CM

## 2016-11-15 DIAGNOSIS — I351 Nonrheumatic aortic (valve) insufficiency: Secondary | ICD-10-CM

## 2016-11-15 DIAGNOSIS — I4819 Other persistent atrial fibrillation: Secondary | ICD-10-CM

## 2016-11-15 DIAGNOSIS — Z7901 Long term (current) use of anticoagulants: Secondary | ICD-10-CM

## 2016-11-15 MED ORDER — IRBESARTAN 150 MG PO TABS: 150.0000 mg | ORAL_TABLET | Freq: Every day | ORAL | 3 refills | Status: DC

## 2016-11-15 NOTE — Progress Notes (Signed)
Cardiology Office Note:    Date:  11/16/2016   ID:  Leslie [name], DOB 09-30-1945, MRN [id number]  PCP:  Darcus Austin, MD  Cardiologist:  Sanda Klein, MD    Referring MD: Darcus Austin, MD   Chief Complaint  Patient presents with  . Follow-up    pt denie chest pain and SOB    History of Present Illness:    Leslie [name] is a 71 y.o. female with a hx of Persistent atrial fibrillation returning for follow-up. She presented in April 2018 and it was very difficult to achieve good rate control and she eventually underwent TEE guided cardioversion. In May she had junctional rhythm and her dose of beta blocker was decreased. She has occasional problems with elevated blood pressure when she is anxious, but on typical days for blood pressure control is excellent. She has morbid obesity and obstructive sleep apnea and reports compliance with CPAP. She does not think she's had any interim episodes of atrial fibrillation.  Echocardiography showed moderate aortic insufficiency and mild mitral regurgitation, neither one of which is felt to be the cause of her arrhythmia. He is aware of the valsartan recall and today we decided to switch her to irbesartan, but need to make sure that we have the correct dose of medication. She is taking a half of a tablet (suspect she is taking 80 mg, half 55fa 160 mg tablet).  Past Medical History:  Diagnosis Date  . Adenomatous polyp   . Arthritis    "knees, legs, fingers" (07/30/2016)  . Asthma   . Bursitis of left shoulder    "just finished PT" (07/30/2016)  . Chest pain    a. 2003 Abnl stress test-->Cath: nonobs CAD.  .[place]   . Chronic kidney disease    stage 3  . GERD (gastroesophageal reflux disease)    barrett's esophagus- Dr. BCristina Gong . History of hiatal hernia   . Hyperlipidemia   . Hypertension   . Hypothyroidism   . Migraines    "sporatic; at least a few/year" (07/30/2016)  . Mitral regurgitation    a. 07/2016 Echo: mild  to mod MR;  b. 07/2016 TEE: mild MR.  . Moderate aortic insufficiency    a. 07/2016 Echo: EF 55-60%, mod AI;  b. 07/2016 TEE: EF 50-55%, mod AI.  .Marland KitchenObesity    s/p lap band surgery 6/10  . OSA on CPAP   . PA[place] ([place]ox[place]mal [place] fibrillation) [place]    a. 07/2016 TEE/DCCV: EF 50-55%, mild MR, mod AI, mild to mod TR, neg bubble study-->successful DCCV x1 w/ 120J.  .Marland KitchenPONV (postoperative nausea and vomiting)   . Vitamin D deficiency     Past Surgical History:  Procedure Laterality Date  . APPENDECTOMY     incidental  . CARDIOVERSION N/A 08/01/2016   Procedure: CARDIOVERSION;  Surgeon: MJerline Pain MD;  [facility]  Service: Cardiovascular;  Laterality: N/A;  . CATARACT EXTRACTION W/ INTRAOCULAR LENS  IMPLANT, BILATERAL Bilateral ~ 2008-2017   left - right  . COMBINED HYSTERECTOMY VAGINAL / OOPHORECTOMY / A&P REPAIR  1981  . HERNIA REPAIR    . INGUINAL HERNIA REPAIR Left 1952  . LAPAROSCOPIC GASTRIC BANDING WITH HIATAL HERNIA REPAIR  09/2008  . TEE WITHOUT CARDIOVERSION N/A 08/01/2016   Procedure: TRANSESOPHAGEAL ECHOCARDIOGRAM (TEE);  Surgeon: MJerline Pain MD;  [facility]  Service: Cardiovascular;  Laterality: N/A;  . [place] VAGINAL HY[place]OMY  1981   "  w/1 ovary"    Current Medications: Current Meds  Medication Sig  . acetaminophen (TYLENOL) 500 MG tablet Take 500-1,000 mg by mouth every 6 (six) hours as needed (for pain).  Marland Kitchen albuterol (PROVENTIL HFA;VENTOLIN HFA) 108 (90 BASE) MCG/ACT inhaler Inhale 1-2 puffs into the lungs every 6 (six) hours as needed for wheezing or shortness of breath.  Marland Kitchen apixaban (ELIQUIS) 5 MG TABS tablet Take 1 tablet (5 mg total) by mouth 2 (two) times daily.  Marland Kitchen atorvastatin (LIPITOR) 10 MG tablet Take 10 mg by mouth daily.  . Azelaic Acid-Cleanser-Lotion (FINACEA PLUS) 15 % KIT Apply 1 application topically daily.   . cholecalciferol (VITAMIN D) 1000 UNITS tablet Take 1,000 Units by mouth daily.  .  fexofenadine (ALLEGRA) 180 MG tablet Take 180 mg by mouth daily.  . fluticasone (FLONASE) 50 MCG/ACT nasal spray Place 2 sprays into both nostrils daily.  . fluticasone (FLOVENT HFA) 44 MCG/ACT inhaler Inhale 2 puffs into the lungs 2 (two) times daily.  . furosemide (LASIX) 20 MG tablet Take 20 mg by mouth every morning.  . Glucosamine-Chondroitin (OSTEO BI-FLEX REGULAR STRENGTH PO) Take 1 tablet by mouth daily.  Marland Kitchen levothyroxine (SYNTHROID, LEVOTHROID) 50 MCG tablet Take 50 mcg by mouth daily before breakfast.  . magnesium oxide (MAG-OX) 400 (241.3 Mg) MG tablet Take 1 tablet (400 mg total) by mouth daily.  . metoprolol succinate (TOPROL-XL) 100 MG 24 hr tablet Take 1 tablet (100 mg total) by mouth daily.  . Multiple Vitamins-Minerals (CENTRUM SILVER ADULT 50+ PO) Take 1 tablet by mouth daily.   Marland Kitchen omeprazole (PRILOSEC) 20 MG capsule Take 40 mg by mouth daily before breakfast.  . verapamil (CALAN-SR) 180 MG CR tablet Take 180 mg by mouth 2 (two) times daily.  . Vitamin D, Ergocalciferol, (DRISDOL) 50000 UNITS CAPS capsule Take 50,000 Units by mouth every 7 (seven) days.  . [DISCONTINUED] valsartan (DIOVAN) 160 MG tablet Take 1 tablet (160 mg total) by mouth daily.     Allergies:   Augmentin [amoxicillin-pot clavulanate]; Codeine; Demerol [meperidine]; and Sulfa antibiotics   Social History   Social History  . Marital status: Divorced    Spouse name: N/A  . Number of children: N/A  . Years of education: N/A   Social History Main Topics  . Smoking status: Former Research scientist (life sciences)  . Smokeless tobacco: Never Used     Comment: 07/30/2016 "social smoker only; in college"  . Alcohol use Yes     Comment: 07/30/2016 "couple drinks/month"  . Drug use: No  . Sexual activity: Not Currently   Other Topics Concern  . None   Social History Narrative  . None     Family History: The patient's family history includes Emphysema in her father; Heart failure in her mother; Hypertension in her father and  mother. ROS:   Please see the history of present illness.     All other systems reviewed and are negative.  EKGs/Labs/Other Studies Reviewed:     EKG:  EKG is not ordered today.    Recent Labs: 07/30/2016: TSH 2.513 08/13/2016: BUN 28; Creat 1.49; Hemoglobin 14.3; Magnesium 1.8; Platelets 389; Potassium 4.4; Sodium 139  Recent Lipid Panel    Component Value Date/Time   CHOL 146 07/31/2016 0435   TRIG 174 (H) 07/31/2016 0435   HDL 32 (L) 07/31/2016 0435   CHOLHDL 4.6 07/31/2016 0435   VLDL 35 07/31/2016 0435   LDLCALC 79 07/31/2016 0435    Physical Exam:    VS:  BP 119/67  Pulse 68   Ht 5' 6"  (1.676 m)   Wt 289 lb 6.4 oz (131.3 kg)   BMI 46.71 kg/m     Wt Readings from Last 3 Encounters:  11/15/16 289 lb 6.4 oz (131.3 kg)  09/17/16 290 lb (131.5 kg)  08/13/16 288 lb 6.4 oz (130.8 kg)     GEN: Morbidly obese Well nourished, well developed in no acute distress HEENT: Normal NECK: No JVD; No carotid bruits LYMPHATICS: No lymphadenopathy CARDIAC: RRR, no murmurs, rubs, gallops RESPIRATORY:  Clear to auscultation without rales, wheezing or rhonchi  ABDOMEN: Soft, non-tender, non-distended MUSCULOSKELETAL:  No edema; No deformity  SKIN: Warm and dry NEUROLOGIC:  Alert and oriented x 3 PSYCHIATRIC:  Normal affect   ASSESSMENT:    1. Persistent atrial fibrillation ([place])   2. Chronic anticoagulation   3. Essential hypertension, benign   4. OSA (obstructive sleep apnea)   5. Morbid obesity ([place])   6. Non-rheumatic mitral regurgitation   7. Nonrheumatic aortic valve insufficiency    PLAN:    In order of problems listed above:  1. AFib: No clinical recurrence since her cardioversion in April. She keeps a daily log of blood pressure and heart rate and her heart rate is consistently the 60-70 range. The blood pressure is almost always well controlled.CHADSVasc 3 (age, gender, HTN). 2. Anticoagulation: Well-tolerated, no bleeding problems. 3. HTN: From valsartan to  equivalent dose of irbesartan. She will call us when she gets home to trust the dose. 4. OSA: Compliant with CPAP 5. Obesity: Reviewed the direct relationship between weight and the frequency/burden of atrial fibrillation and encouraged her to try to continue to lose weight. 6. MR/AR: Neither one of her valvular abnormalities appear severe enough to be the cause of atrial fibrillation, which is most likely related to sleep apnea and obesity. Will review the valvular abnormalities in a couple of years with echo.   Medication Adjustments/Labs and Tests Ordered: Current medicines are reviewed at length with the patient today.  Concerns regarding medicines are outlined above.  No orders of the defined types were placed in this encounter.  No orders of the defined types were placed in this encounter.   Signed, Sanda Klein, MD  11/16/2016 4:57 PM    [place]

## 2016-11-15 NOTE — Telephone Encounter (Signed)
Pt called back per [place] to give dosage for Valsartan, she was taking 160mg , would like 90 days for the replacement med-uses [place]

## 2016-11-15 NOTE — Telephone Encounter (Signed)
Spoke with pt, she reports taking 1/2 of a 320 mg table of valsartan once daily. New script for irbesartan 150 mg sent to the pharmacy.

## 2016-11-15 NOTE — Patient Instructions (Signed)
Please call back with the dose of Valsartan you are taking!  Dr Sallyanne Kuster recommends that you schedule a follow-up appointment in 12 months. You will receive a reminder letter in the mail two months in advance. If you don't receive a letter, please call our office to schedule the follow-up appointment.  If you need a refill on your cardiac medications before your next appointment, please call your pharmacy.

## 2016-11-16 DIAGNOSIS — G4733 Obstructive sleep apnea (adult) (pediatric): Secondary | ICD-10-CM | POA: Insufficient documentation

## 2016-11-21 ENCOUNTER — Ambulatory Visit
Admission: RE | Admit: 2016-11-21 | Discharge: 2016-11-21 | Disposition: A | Discharge status: Home / Self Care | Payer: Medicare Other | Source: Ambulatory Visit | Attending: Family Medicine | Admitting: Family Medicine

## 2016-11-21 DIAGNOSIS — Z1231 Encounter for screening mammogram for malignant neoplasm of breast: Secondary | ICD-10-CM

## 2016-11-27 ENCOUNTER — Telehealth: Payer: Self-pay | Admitting: Cardiovascular Disease

## 2016-11-27 MED ORDER — METOPROLOL SUCCINATE ER 100 MG PO TB24: ORAL_TABLET | ORAL | 1 refills | Status: DC

## 2016-11-27 NOTE — Telephone Encounter (Signed)
New message    Patient c/o Palpitations:  High priority if patient c/o lightheadedness and shortness of breath.  1. How long have you been having palpitations? For a few days since monday  2. Are you currently experiencing lightheadedness and shortness of breath? Not right now  3. Have you checked your BP and heart rate? (document readings) 123/88 heart rate 92, 130/96 heart rate 123  4. Are you experiencing any other symptoms? No just feeling palpitations

## 2016-11-27 NOTE — Telephone Encounter (Signed)
These increased the metoprolol succinate to 150 mg once daily. She can take this all at once or take 100 mg in the morning and 50 mg at night, per her preference. MCr

## 2016-11-27 NOTE — Telephone Encounter (Signed)
Returned call to patient of Dr. Loletha Grayer regarding palpitations (h/o AF) Denies chest pain/pressure & shortness of breath  Verified compliance with medications Patient last seen 8/3 - no EKG performed  8/14 am - BP 126/90 HR 110  8/14 pm - BP 123/88 HR 92 8/15 am - BP 130/96 HR 123  Patient can be reached on home phone until 11am - after 11am, call cell  Will defer to MD to advise - EKG check? Med adjustment?

## 2016-11-27 NOTE — Telephone Encounter (Signed)
Patient called with MD recommendations. Voiced understanding & agreed w/plan. Rx(s) sent to pharmacy electronically.

## 2016-12-23 ENCOUNTER — Other Ambulatory Visit: Payer: Self-pay | Admitting: Cardiology

## 2016-12-23 NOTE — Telephone Encounter (Signed)
Rx(s) sent to pharmacy electronically.  

## 2016-12-27 DIAGNOSIS — Z86010 Personal history of colonic polyps: Secondary | ICD-10-CM | POA: Diagnosis not present

## 2016-12-27 DIAGNOSIS — D123 Benign neoplasm of transverse colon: Secondary | ICD-10-CM | POA: Diagnosis not present

## 2016-12-27 DIAGNOSIS — K2270 Barrett's esophagus without dysplasia: Secondary | ICD-10-CM | POA: Diagnosis not present

## 2016-12-27 DIAGNOSIS — K317 Polyp of stomach and duodenum: Secondary | ICD-10-CM | POA: Diagnosis not present

## 2016-12-27 DIAGNOSIS — D122 Benign neoplasm of ascending colon: Secondary | ICD-10-CM | POA: Diagnosis not present

## 2017-01-06 DIAGNOSIS — K2270 Barrett's esophagus without dysplasia: Secondary | ICD-10-CM | POA: Diagnosis not present

## 2017-01-06 DIAGNOSIS — M170 Bilateral primary osteoarthritis of knee: Secondary | ICD-10-CM | POA: Diagnosis not present

## 2017-01-06 DIAGNOSIS — K317 Polyp of stomach and duodenum: Secondary | ICD-10-CM | POA: Diagnosis not present

## 2017-01-13 DIAGNOSIS — M170 Bilateral primary osteoarthritis of knee: Secondary | ICD-10-CM | POA: Diagnosis not present

## 2017-01-17 DIAGNOSIS — Z23 Encounter for immunization: Secondary | ICD-10-CM | POA: Diagnosis not present

## 2017-01-20 DIAGNOSIS — M25512 Pain in left shoulder: Secondary | ICD-10-CM | POA: Diagnosis not present

## 2017-01-20 DIAGNOSIS — G8929 Other chronic pain: Secondary | ICD-10-CM | POA: Diagnosis not present

## 2017-01-20 DIAGNOSIS — M170 Bilateral primary osteoarthritis of knee: Secondary | ICD-10-CM | POA: Diagnosis not present

## 2017-01-21 ENCOUNTER — Other Ambulatory Visit: Payer: Self-pay | Admitting: Cardiology

## 2017-02-26 ENCOUNTER — Other Ambulatory Visit: Payer: Self-pay | Admitting: Cardiovascular Disease

## 2017-03-03 DIAGNOSIS — M170 Bilateral primary osteoarthritis of knee: Secondary | ICD-10-CM | POA: Diagnosis not present

## 2017-03-17 DIAGNOSIS — Z0000 Encounter for general adult medical examination without abnormal findings: Secondary | ICD-10-CM | POA: Diagnosis not present

## 2017-03-17 DIAGNOSIS — I480 Paroxysmal atrial fibrillation: Secondary | ICD-10-CM | POA: Diagnosis not present

## 2017-03-17 DIAGNOSIS — Z1159 Encounter for screening for other viral diseases: Secondary | ICD-10-CM | POA: Diagnosis not present

## 2017-03-17 DIAGNOSIS — I10 Essential (primary) hypertension: Secondary | ICD-10-CM | POA: Diagnosis not present

## 2017-03-17 DIAGNOSIS — E039 Hypothyroidism, unspecified: Secondary | ICD-10-CM | POA: Diagnosis not present

## 2017-03-17 DIAGNOSIS — R739 Hyperglycemia, unspecified: Secondary | ICD-10-CM | POA: Diagnosis not present

## 2017-03-17 DIAGNOSIS — E7800 Pure hypercholesterolemia, unspecified: Secondary | ICD-10-CM | POA: Diagnosis not present

## 2017-04-02 ENCOUNTER — Encounter (HOSPITAL_COMMUNITY): Payer: Self-pay

## 2017-04-23 DIAGNOSIS — E039 Hypothyroidism, unspecified: Secondary | ICD-10-CM | POA: Diagnosis not present

## 2017-04-25 DIAGNOSIS — G4733 Obstructive sleep apnea (adult) (pediatric): Secondary | ICD-10-CM | POA: Diagnosis not present

## 2017-05-20 DIAGNOSIS — M8588 Other specified disorders of bone density and structure, other site: Secondary | ICD-10-CM | POA: Diagnosis not present

## 2017-05-20 DIAGNOSIS — E2839 Other primary ovarian failure: Secondary | ICD-10-CM | POA: Diagnosis not present

## 2017-07-07 ENCOUNTER — Other Ambulatory Visit: Payer: Self-pay | Admitting: *Deleted

## 2017-07-07 MED ORDER — APIXABAN 5 MG PO TABS: 5.0000 mg | ORAL_TABLET | Freq: Two times a day (BID) | ORAL | 1 refills | Status: DC

## 2017-07-07 NOTE — Telephone Encounter (Signed)
Patient requested a ninety day rx.

## 2017-07-31 DIAGNOSIS — G4733 Obstructive sleep apnea (adult) (pediatric): Secondary | ICD-10-CM | POA: Diagnosis not present

## 2017-08-05 ENCOUNTER — Other Ambulatory Visit: Payer: Self-pay | Admitting: Cardiovascular Disease

## 2017-08-18 DIAGNOSIS — M1712 Unilateral primary osteoarthritis, left knee: Secondary | ICD-10-CM | POA: Diagnosis not present

## 2017-08-18 DIAGNOSIS — M1711 Unilateral primary osteoarthritis, right knee: Secondary | ICD-10-CM | POA: Diagnosis not present

## 2017-08-26 DIAGNOSIS — M170 Bilateral primary osteoarthritis of knee: Secondary | ICD-10-CM | POA: Diagnosis not present

## 2017-09-01 DIAGNOSIS — M1712 Unilateral primary osteoarthritis, left knee: Secondary | ICD-10-CM | POA: Diagnosis not present

## 2017-09-01 DIAGNOSIS — M1711 Unilateral primary osteoarthritis, right knee: Secondary | ICD-10-CM | POA: Diagnosis not present

## 2017-09-15 DIAGNOSIS — I10 Essential (primary) hypertension: Secondary | ICD-10-CM | POA: Diagnosis not present

## 2017-09-15 DIAGNOSIS — R21 Rash and other nonspecific skin eruption: Secondary | ICD-10-CM | POA: Diagnosis not present

## 2017-09-15 DIAGNOSIS — E559 Vitamin D deficiency, unspecified: Secondary | ICD-10-CM | POA: Diagnosis not present

## 2017-09-15 DIAGNOSIS — E039 Hypothyroidism, unspecified: Secondary | ICD-10-CM | POA: Diagnosis not present

## 2017-09-15 DIAGNOSIS — N183 Chronic kidney disease, stage 3 (moderate): Secondary | ICD-10-CM | POA: Diagnosis not present

## 2017-09-15 DIAGNOSIS — I480 Paroxysmal atrial fibrillation: Secondary | ICD-10-CM | POA: Diagnosis not present

## 2017-09-15 DIAGNOSIS — R7303 Prediabetes: Secondary | ICD-10-CM | POA: Diagnosis not present

## 2017-09-15 DIAGNOSIS — M791 Myalgia, unspecified site: Secondary | ICD-10-CM | POA: Diagnosis not present

## 2017-09-15 DIAGNOSIS — E7800 Pure hypercholesterolemia, unspecified: Secondary | ICD-10-CM | POA: Diagnosis not present

## 2017-10-13 DIAGNOSIS — M170 Bilateral primary osteoarthritis of knee: Secondary | ICD-10-CM | POA: Diagnosis not present

## 2017-10-22 ENCOUNTER — Other Ambulatory Visit: Payer: Self-pay | Admitting: Cardiology

## 2017-10-22 ENCOUNTER — Other Ambulatory Visit: Payer: Self-pay | Admitting: Cardiovascular Disease

## 2017-10-22 NOTE — Telephone Encounter (Signed)
Please refer to PCP 

## 2017-10-23 ENCOUNTER — Telehealth: Payer: Self-pay | Admitting: Cardiovascular Disease

## 2017-10-23 MED ORDER — FLUTICASONE PROPIONATE HFA 44 MCG/ACT IN AERO: 2.0000 | INHALATION_SPRAY | Freq: Two times a day (BID) | RESPIRATORY_TRACT | 2 refills | Status: DC

## 2017-10-23 NOTE — Telephone Encounter (Signed)
Submitted

## 2017-10-23 NOTE — Telephone Encounter (Signed)
New Message    *STAT* If patient is at the pharmacy, call can be transferred to refill team.   1. Which medications need to be refilled? (please list name of each medication and dose if known) fluticasone (FLOVENT HFA) 44 MCG/ACT inhaler  2. Which pharmacy/location (including street and city if local pharmacy) is medication to be sent to? [facility] [place], [address] AT [address]  3. Do they need a 30 day or 90 day supply? [place]

## 2017-10-24 ENCOUNTER — Other Ambulatory Visit: Payer: Self-pay | Admitting: Family Medicine

## 2017-10-24 DIAGNOSIS — Z1231 Encounter for screening mammogram for malignant neoplasm of breast: Secondary | ICD-10-CM

## 2017-10-31 DIAGNOSIS — D2372 Other benign neoplasm of skin of left lower limb, including hip: Secondary | ICD-10-CM | POA: Diagnosis not present

## 2017-10-31 DIAGNOSIS — D225 Melanocytic nevi of trunk: Secondary | ICD-10-CM | POA: Diagnosis not present

## 2017-10-31 DIAGNOSIS — L718 Other rosacea: Secondary | ICD-10-CM | POA: Diagnosis not present

## 2017-10-31 DIAGNOSIS — L820 Inflamed seborrheic keratosis: Secondary | ICD-10-CM | POA: Diagnosis not present

## 2017-10-31 DIAGNOSIS — L723 Sebaceous cyst: Secondary | ICD-10-CM | POA: Diagnosis not present

## 2017-10-31 DIAGNOSIS — L814 Other melanin hyperpigmentation: Secondary | ICD-10-CM | POA: Diagnosis not present

## 2017-10-31 DIAGNOSIS — L821 Other seborrheic keratosis: Secondary | ICD-10-CM | POA: Diagnosis not present

## 2017-10-31 DIAGNOSIS — D1801 Hemangioma of skin and subcutaneous tissue: Secondary | ICD-10-CM | POA: Diagnosis not present

## 2017-10-31 DIAGNOSIS — B079 Viral wart, unspecified: Secondary | ICD-10-CM | POA: Diagnosis not present

## 2017-10-31 DIAGNOSIS — L918 Other hypertrophic disorders of the skin: Secondary | ICD-10-CM | POA: Diagnosis not present

## 2017-10-31 DIAGNOSIS — D485 Neoplasm of uncertain behavior of skin: Secondary | ICD-10-CM | POA: Diagnosis not present

## 2017-10-31 DIAGNOSIS — L738 Other specified follicular disorders: Secondary | ICD-10-CM | POA: Diagnosis not present

## 2017-10-31 DIAGNOSIS — L7211 Pilar cyst: Secondary | ICD-10-CM | POA: Diagnosis not present

## 2017-11-04 DIAGNOSIS — G4733 Obstructive sleep apnea (adult) (pediatric): Secondary | ICD-10-CM | POA: Diagnosis not present

## 2017-12-04 ENCOUNTER — Ambulatory Visit
Admission: RE | Admit: 2017-12-04 | Discharge: 2017-12-04 | Disposition: A | Discharge status: Home / Self Care | Payer: Medicare Other | Source: Ambulatory Visit | Attending: Family Medicine | Admitting: Family Medicine

## 2017-12-04 DIAGNOSIS — Z1231 Encounter for screening mammogram for malignant neoplasm of breast: Secondary | ICD-10-CM | POA: Diagnosis not present

## 2017-12-20 ENCOUNTER — Other Ambulatory Visit: Payer: Self-pay | Admitting: Cardiovascular Disease

## 2017-12-23 ENCOUNTER — Other Ambulatory Visit: Payer: Self-pay

## 2017-12-23 ENCOUNTER — Encounter: Payer: Self-pay | Admitting: Cardiovascular Disease

## 2017-12-23 ENCOUNTER — Ambulatory Visit: Payer: PPO | Admitting: Cardiovascular Disease

## 2017-12-23 VITALS — BP 122/64 | HR 67 | Ht 66.0 in | Wt 276.6 lb

## 2017-12-23 DIAGNOSIS — Z7901 Long term (current) use of anticoagulants: Secondary | ICD-10-CM

## 2017-12-23 DIAGNOSIS — I340 Nonrheumatic mitral (valve) insufficiency: Secondary | ICD-10-CM | POA: Diagnosis not present

## 2017-12-23 DIAGNOSIS — Z01810 Encounter for preprocedural cardiovascular examination: Secondary | ICD-10-CM | POA: Diagnosis not present

## 2017-12-23 DIAGNOSIS — G4733 Obstructive sleep apnea (adult) (pediatric): Secondary | ICD-10-CM

## 2017-12-23 DIAGNOSIS — R0989 Other specified symptoms and signs involving the circulatory and respiratory systems: Secondary | ICD-10-CM

## 2017-12-23 DIAGNOSIS — I352 Nonrheumatic aortic (valve) stenosis with insufficiency: Secondary | ICD-10-CM | POA: Diagnosis not present

## 2017-12-23 DIAGNOSIS — I480 Paroxysmal atrial fibrillation: Secondary | ICD-10-CM | POA: Diagnosis not present

## 2017-12-23 DIAGNOSIS — I10 Essential (primary) hypertension: Secondary | ICD-10-CM | POA: Diagnosis not present

## 2017-12-23 MED ORDER — MAGNESIUM OXIDE 400 (241.3 MG) MG PO TABS: 400.0000 mg | ORAL_TABLET | Freq: Every day | ORAL | 3 refills | Status: DC

## 2017-12-23 NOTE — Patient Instructions (Signed)
Medication Instructions: Dr Sallyanne Kuster recommends that you continue on your current medications as directed. Please refer to the Current Medication list given to you today.  Labwork: NONE ORDERED  Testing/Procedures: 1. Carotid Duplex - Your physician has requested that you have a carotid duplex. This test is an ultrasound of the carotid arteries in your neck. It looks at blood flow through these arteries that supply the brain with blood. Allow one hour for this exam. There are no restrictions or special instructions.  Follow-up: Dr Sallyanne Kuster recommends that you schedule a follow-up appointment in 12 months. You will receive a reminder letter in the mail two months in advance. If you don't receive a letter, please call our office to schedule the follow-up appointment.  If you need a refill on your cardiac medications before your next appointment, please call your pharmacy.

## 2017-12-23 NOTE — Progress Notes (Signed)
Cardiology Office Note:    Date:  12/23/2017   ID:  Leslie [name], DOB Jun 08, 1945, MRN [id number]  PCP:  Darcus Austin, MD  Cardiologist:  Sanda Klein, MD    Referring MD: Darcus Austin, MD   Chief Complaint  Patient presents with  . Atrial Fibrillation    History of Present Illness:    Leslie [name] is a 72 y.o. female with a hx of Persistent atrial fibrillation returning for follow-up. She presented in April 2018 and it was very difficult to achieve good rate control and she eventually underwent TEE guided cardioversion. In May 2018 she had junctional rhythm and her dose of beta blocker was decreased.  Additional problems include morbid obesity, obstructive sleep apnea on CPAP and treated hypertension. Echocardiography showed moderate aortic insufficiency and mild mitral regurgitation, neither one of which is felt to be the cause of her arrhythmia.   She is generally done well since her last appointment.  She occasionally notices irregular palpitations when she lies down in bed, but these do not trouble her during the day and did not seem to associate shortness of breath or other adverse cardiovascular complaints.  She denies any problems with chest pain, dizziness or syncope, leg edema, claudication or focal neurological complaints, falls/injuries or any bleeding problems on treatment with Eliquis.  She has lost some weight but physical activity is limited by left knee pain.  She has had injections and has reached the limit of their benefit.  She is considering possible total knee replacement with Dr. Gladstone Lighter  Recent labs are significant for hemoglobin A1c 5.8%, creatinine 1.12, potassium 4.1, normal liver function test, normal TSH 2.92, total cholesterol 163, LDL 93, HDL 33, triglycerides 187.  These were performed on September 15, 2017  Past Medical History:  Diagnosis Date  . Adenomatous polyp   . Arthritis    "knees, legs, fingers" (07/30/2016)  . Asthma   . Bursitis of left  shoulder    "just finished PT" (07/30/2016)  . Chest pain    a. 2003 Abnl stress test-->Cath: nonobs CAD.  Marland Kitchen Chronic bronchitis ([place])   . Chronic kidney disease    stage 3  . GERD (gastroesophageal reflux disease)    barrett's esophagus- Dr. Cristina Gong  . History of hiatal hernia   . Hyperlipidemia   . Hypertension   . Hypothyroidism   . Migraines    "sporatic; at least a few/year" (07/30/2016)  . Mitral regurgitation    a. 07/2016 Echo: mild to mod MR;  b. 07/2016 TEE: mild MR.  . Moderate aortic insufficiency    a. 07/2016 Echo: EF 55-60%, mod AI;  b. 07/2016 TEE: EF 50-55%, mod AI.  Marland Kitchen Obesity    s/p lap band surgery 6/10  . OSA on CPAP   . PAF (paroxysmal atrial fibrillation) ([place])    a. 07/2016 TEE/DCCV: EF 50-55%, mild MR, mod AI, mild to mod TR, neg bubble study-->successful DCCV x1 w/ 120J.  Marland Kitchen PONV (postoperative nausea and vomiting)   . Vitamin D deficiency     Past Surgical History:  Procedure Laterality Date  . APPENDECTOMY     incidental  . CARDIOVERSION N/A 08/01/2016   Procedure: CARDIOVERSION;  Surgeon: Jerline Pain, MD;  Location: [facility];  Service: Cardiovascular;  Laterality: N/A;  . CATARACT EXTRACTION W/ INTRAOCULAR LENS  IMPLANT, BILATERAL Bilateral ~ 2008-2017   left - right  . COMBINED HYSTERECTOMY VAGINAL / OOPHORECTOMY / A&P REPAIR  1981  . HERNIA REPAIR    .  INGUINAL HERNIA REPAIR Left 1952  . LAPAROSCOPIC GASTRIC BANDING WITH HIATAL HERNIA REPAIR  09/2008  . TEE WITHOUT CARDIOVERSION N/A 08/01/2016   Procedure: TRANSESOPHAGEAL ECHOCARDIOGRAM (TEE);  Surgeon: Jerline Pain, MD;  Location: [facility];  Service: Cardiovascular;  Laterality: N/A;  . [place]  . VAGINAL HYSTERECTOMY  1981   "w/1 ovary"    Current Medications: Current Meds  Medication Sig  . acetaminophen (TYLENOL) 500 MG tablet Take 500-1,000 mg by mouth every 6 (six) hours as needed (for pain).  Marland Kitchen albuterol (PROVENTIL HFA;VENTOLIN HFA) 108 (90 BASE)  MCG/ACT inhaler Inhale 1-2 puffs into the lungs every 6 (six) hours as needed for wheezing or shortness of breath.  Marland Kitchen apixaban (ELIQUIS) 5 MG TABS tablet Take 1 tablet (5 mg total) by mouth 2 (two) times daily.  Marland Kitchen atorvastatin (LIPITOR) 10 MG tablet Take 10 mg by mouth daily.  . Azelaic Acid-Cleanser-Lotion (FINACEA PLUS) 15 % KIT Apply 1 application topically daily.   . cholecalciferol (VITAMIN D) 1000 UNITS tablet Take 1,000 Units by mouth daily.  . fexofenadine (ALLEGRA) 180 MG tablet Take 180 mg by mouth daily.  . fluticasone (FLONASE) 50 MCG/ACT nasal spray Place 2 sprays into both nostrils daily.  . fluticasone (FLOVENT HFA) 44 MCG/ACT inhaler Inhale 2 puffs into the lungs 2 (two) times daily.  . furosemide (LASIX) 20 MG tablet Take 20 mg by mouth every morning.  . Glucosamine-Chondroitin (OSTEO BI-FLEX REGULAR STRENGTH PO) Take 1 tablet by mouth daily.  . irbesartan (AVAPRO) 150 MG tablet TAKE 1 TABLET(150 MG) BY MOUTH DAILY  . levothyroxine (SYNTHROID, LEVOTHROID) 50 MCG tablet Take 50 mcg by mouth daily before breakfast.  . metoprolol succinate (TOPROL-XL) 100 MG 24 hr tablet TAKE 1 AND 1/2 TABLETS(150 MG) BY MOUTH DAILY  . Multiple Vitamins-Minerals (CENTRUM SILVER ADULT 50+ PO) Take 1 tablet by mouth daily.   Marland Kitchen omeprazole (PRILOSEC) 20 MG capsule Take 40 mg by mouth daily before breakfast.  . verapamil (CALAN-SR) 180 MG CR tablet Take 180 mg by mouth 2 (two) times daily.  . Vitamin D, Ergocalciferol, (DRISDOL) 50000 UNITS CAPS capsule Take 50,000 Units by mouth every 7 (seven) days.  . [DISCONTINUED] MAGNESIUM-OXIDE 400 (241.3 Mg) MG tablet TAKE 1 TABLET(400 MG) BY MOUTH DAILY  . [DISCONTINUED] MAGNESIUM-OXIDE 400 (241.3 Mg) MG tablet TAKE 1 TABLET(400 MG) BY MOUTH DAILY     Allergies:   Augmentin [amoxicillin-pot clavulanate]; Codeine; Demerol [meperidine]; and Sulfa antibiotics   Social History   Socioeconomic History  . Marital status: Divorced    Spouse name: Not on file  .  Number of children: Not on file  . Years of education: Not on file  . Highest education level: Not on file  Occupational History  . Not on file  Social Needs  . Financial resource strain: Not on file  . Food insecurity:    Worry: Not on file    Inability: Not on file  . Transportation needs:    Medical: Not on file    Non-medical: Not on file  Tobacco Use  . Smoking status: Former Research scientist (life sciences)  . Smokeless tobacco: Never Used  . Tobacco comment: 07/30/2016 "social smoker only; in college"  Substance and Sexual Activity  . Alcohol use: Yes    Comment: 07/30/2016 "couple drinks/month"  . Drug use: No  . Sexual activity: Not Currently  Lifestyle  . Physical activity:    Days per week: Not on file    Minutes per session: Not on file  .  Stress: Not on file  Relationships  . Social connections:    Talks on phone: Not on file    Gets together: Not on file    Attends religious service: Not on file    Active member of club or organization: Not on file    Attends meetings of clubs or organizations: Not on file    Relationship status: Not on file  Other Topics Concern  . Not on file  Social History Narrative  . Not on file     Family History: The patient's family history includes Emphysema in her father; Heart failure in her mother; Hypertension in her father and mother. ROS:   Please see the history of present illness.     All other systems reviewed and are negative.  EKGs/Labs/Other Studies Reviewed:     EKG:  EKG is ordered today.  It shows normal sinus rhythm with poor R wave progression and generalized low voltage, all consistent with obesity.  No repolarization abnormalities, normal QT.  Recent Labs: September 15, 2017 hemoglobin A1c 5.8%, creatinine 1.12, potassium 4.1, normal liver function test, normal TSH 2.92  Recent Lipid Panel September 15, 2017  total cholesterol 163, LDL 93, HDL 33, triglycerides 187        Component Value Date/Time   CHOL 146 07/31/2016 0435   TRIG 174  (H) 07/31/2016 0435   HDL 32 (L) 07/31/2016 0435   CHOLHDL 4.6 07/31/2016 0435   VLDL 35 07/31/2016 0435   LDLCALC 79 07/31/2016 0435    Physical Exam:    VS:  BP 122/64   Pulse 67   Ht _0  (1.676 m)   Wt 276 lb 9.6 oz (125.5 kg)   BMI 44.64 kg/m     Wt Readings from Last 3 Encounters:  12/23/17 276 lb 9.6 oz (125.5 kg)  11/15/16 289 lb 6.4 oz (131.3 kg)  09/17/16 290 lb (131.5 kg)     General: lert, oriented x3, no distress, morbidly obese Head: no evidence of trauma, PERRL, EOMI, no exophtalmos or lid lag, no myxedema, no xanthelasma; normal ears, nose and oropharynx Neck: normal jugular venous pulsations and no hepatojugular reflux; brisk carotid pulses without delay.  There is a distinct right carotid bruit.  There is no bruit on the left. Chest: clear to auscultation, no signs of consolidation by percussion or palpation, normal fremitus, symmetrical and full respiratory excursions Cardiovascular: normal position and quality of the apical impulse, regular rhythm, normal first and second heart sounds, no murmurs, rubs or gallops Abdomen: no tenderness or distention, no masses by palpation, no abnormal pulsatility or arterial bruits, normal bowel sounds, no hepatosplenomegaly Extremities: no clubbing, cyanosis or edema; 2+ radial, ulnar and brachial pulses bilaterally; 2+ right femoral, posterior tibial and dorsalis pedis pulses; 2+ left femoral, posterior tibial and dorsalis pedis pulses; no subclavian or femoral bruits Neurological: grossly nonfocal Psych: Normal mood and affect   ASSESSMENT:    1. Paroxysmal atrial fibrillation (HCC)   2. Long term current use of anticoagulant   3. Essential hypertension   4. Obstructive sleep apnea   5. Morbid obesity ([place])   6. Non-rheumatic mitral regurgitation   7. Nonrheumatic aortic insufficiency with aortic stenosis   8. Bruit of right carotid artery   9. Preoperative cardiovascular examination    PLAN:    In order of  problems listed above:  1. AFib: She has had some palpitations, but no episodes of tachycardia.  She checks her blood pressure and heart rate frequently and her  heart rate is consistently in the 70s occasionally her blood pressure monitor [name] tell her that her rhythm is irregular, but the rate remains in the 70s.  CHADSVasc 3 (age, gender, HTN). 2. Anticoagulation: Well-tolerated, no bleeding problems. 3. HTN: Excellent control 4. OSA: Compliant with CPAP and reports good results, no daytime hypersomnolence 5. Obesity: Reminded her of our discussion regarding the direct relationship between weight and the overall burden of arrhythmia.  She is congratulated on losing about 15 pounds in the last year. 6. MR/AR: Neither one of her valvular abnormalities appear severe enough to be the cause of atrial fibrillation.  We [name] plan to reevaluate next year. 7. R carotid bruit: This is a new finding.  We [name] schedule for duplex ultrasonography.  Asymptomatic. 8. Preop CV exam: If she decides to go ahead with left total knee replacement, I think she is at low to moderate risk of major cardiovascular complications.  Her apixaban should be stopped 48 hours before surgery and resumed as soon as possible in full dose (5 mg twice daily) after surgery for both stroke and DVT prophylaxis.  It would be very important that her metoprolol is not interrupted on the day of surgery or any of the subsequent postop days, even if other medications have to be held.   Medication Adjustments/Labs and Tests Ordered: Current medicines are reviewed at length with the patient today.  Concerns regarding medicines are outlined above.  Orders Placed This Encounter  Procedures  . EKG 12-Lead   No orders of the defined types were placed in this encounter.   Signed, Sanda Klein, MD  12/23/2017 6:52 PM    [place]

## 2017-12-30 ENCOUNTER — Ambulatory Visit (HOSPITAL_COMMUNITY)
Admission: RE | Admit: 2017-12-30 | Discharge: 2017-12-30 | Disposition: A | Discharge status: Home / Self Care | Payer: PPO | Source: Ambulatory Visit | Attending: Cardiology | Admitting: Cardiology

## 2017-12-30 DIAGNOSIS — R0989 Other specified symptoms and signs involving the circulatory and respiratory systems: Secondary | ICD-10-CM | POA: Insufficient documentation

## 2018-01-02 ENCOUNTER — Telehealth: Payer: Self-pay

## 2018-01-02 DIAGNOSIS — Z23 Encounter for immunization: Secondary | ICD-10-CM | POA: Diagnosis not present

## 2018-01-02 NOTE — Telephone Encounter (Signed)
-----   Message from Sanda Klein, MD sent at 12/31/2017  5:18 PM EDT ----- Carotids are OK. Bruit is due to minor vessel wall irregularities, no meaningful blockage.

## 2018-01-02 NOTE — Telephone Encounter (Signed)
Called patient with results. Patient verbalized understanding. 

## 2018-01-02 NOTE — Telephone Encounter (Signed)
lmtcb

## 2018-01-12 ENCOUNTER — Other Ambulatory Visit: Payer: Self-pay | Admitting: Cardiovascular Disease

## 2018-01-20 ENCOUNTER — Other Ambulatory Visit: Payer: Self-pay | Admitting: Pharmacist

## 2018-01-20 MED ORDER — APIXABAN 5 MG PO TABS: 5.0000 mg | ORAL_TABLET | Freq: Two times a day (BID) | ORAL | 1 refills | Status: DC

## 2018-01-23 DIAGNOSIS — L720 Epidermal cyst: Secondary | ICD-10-CM | POA: Diagnosis not present

## 2018-02-05 DIAGNOSIS — G4733 Obstructive sleep apnea (adult) (pediatric): Secondary | ICD-10-CM | POA: Diagnosis not present

## 2018-02-26 ENCOUNTER — Other Ambulatory Visit: Payer: Self-pay | Admitting: *Deleted

## 2018-03-03 ENCOUNTER — Other Ambulatory Visit: Payer: Self-pay | Admitting: Cardiovascular Disease

## 2018-03-03 NOTE — Telephone Encounter (Signed)
°*  STAT* If patient is at the pharmacy, call can be transferred to refill team.   1. Which medications need to be refilled? (please list name of each medication and dose if known) Flovent Inhaler  2. Which pharmacy/location (including street and city if local pharmacy) is medication to be sent to?[facility]- [phone]  3. Do they need a 30 day or 90 day supply? 1 inhaler

## 2018-03-04 MED ORDER — FLUTICASONE PROPIONATE HFA 44 MCG/ACT IN AERO: 2.0000 | INHALATION_SPRAY | Freq: Two times a day (BID) | RESPIRATORY_TRACT | 0 refills | Status: DC

## 2018-03-04 NOTE — Telephone Encounter (Signed)
Rx has been sent to the pharmacy electronically. ° °

## 2018-03-06 ENCOUNTER — Other Ambulatory Visit: Payer: Self-pay

## 2018-03-06 NOTE — Telephone Encounter (Signed)
Defer PCP

## 2018-03-06 NOTE — Telephone Encounter (Signed)
See note from 03/06/18.

## 2018-03-17 DIAGNOSIS — M1711 Unilateral primary osteoarthritis, right knee: Secondary | ICD-10-CM | POA: Diagnosis not present

## 2018-03-17 DIAGNOSIS — M1712 Unilateral primary osteoarthritis, left knee: Secondary | ICD-10-CM | POA: Diagnosis not present

## 2018-03-17 DIAGNOSIS — M170 Bilateral primary osteoarthritis of knee: Secondary | ICD-10-CM | POA: Diagnosis not present

## 2018-03-23 DIAGNOSIS — M1711 Unilateral primary osteoarthritis, right knee: Secondary | ICD-10-CM | POA: Diagnosis not present

## 2018-03-23 DIAGNOSIS — M1712 Unilateral primary osteoarthritis, left knee: Secondary | ICD-10-CM | POA: Diagnosis not present

## 2018-03-30 DIAGNOSIS — M1711 Unilateral primary osteoarthritis, right knee: Secondary | ICD-10-CM | POA: Diagnosis not present

## 2018-03-30 DIAGNOSIS — M1712 Unilateral primary osteoarthritis, left knee: Secondary | ICD-10-CM | POA: Diagnosis not present

## 2018-04-03 ENCOUNTER — Encounter (HOSPITAL_COMMUNITY): Payer: Self-pay

## 2018-04-16 ENCOUNTER — Other Ambulatory Visit: Payer: Self-pay | Admitting: Cardiovascular Disease

## 2018-04-21 DIAGNOSIS — Z6841 Body Mass Index (BMI) 40.0 and over, adult: Secondary | ICD-10-CM | POA: Diagnosis not present

## 2018-04-21 DIAGNOSIS — E039 Hypothyroidism, unspecified: Secondary | ICD-10-CM | POA: Diagnosis not present

## 2018-04-21 DIAGNOSIS — R7303 Prediabetes: Secondary | ICD-10-CM | POA: Diagnosis not present

## 2018-04-21 DIAGNOSIS — I480 Paroxysmal atrial fibrillation: Secondary | ICD-10-CM | POA: Diagnosis not present

## 2018-04-21 DIAGNOSIS — E7800 Pure hypercholesterolemia, unspecified: Secondary | ICD-10-CM | POA: Diagnosis not present

## 2018-04-21 DIAGNOSIS — N183 Chronic kidney disease, stage 3 (moderate): Secondary | ICD-10-CM | POA: Diagnosis not present

## 2018-04-21 DIAGNOSIS — Z0000 Encounter for general adult medical examination without abnormal findings: Secondary | ICD-10-CM | POA: Diagnosis not present

## 2018-04-21 DIAGNOSIS — I10 Essential (primary) hypertension: Secondary | ICD-10-CM | POA: Diagnosis not present

## 2018-05-11 DIAGNOSIS — M1711 Unilateral primary osteoarthritis, right knee: Secondary | ICD-10-CM | POA: Diagnosis not present

## 2018-05-11 DIAGNOSIS — M1712 Unilateral primary osteoarthritis, left knee: Secondary | ICD-10-CM | POA: Diagnosis not present

## 2018-05-11 DIAGNOSIS — Z6841 Body Mass Index (BMI) 40.0 and over, adult: Secondary | ICD-10-CM | POA: Diagnosis not present

## 2018-05-11 DIAGNOSIS — G4733 Obstructive sleep apnea (adult) (pediatric): Secondary | ICD-10-CM | POA: Diagnosis not present

## 2018-06-09 DIAGNOSIS — R21 Rash and other nonspecific skin eruption: Secondary | ICD-10-CM | POA: Diagnosis not present

## 2018-06-27 ENCOUNTER — Telehealth: Payer: Self-pay | Admitting: Cardiology

## 2018-06-27 NOTE — Telephone Encounter (Signed)
The patient reports that her BP has been 140s/90s over the past week. Other days have been 120s/80s. This evening she checked her BP and states that her BP was 146/129; she then took it again and was 162/136. Her HR is 80s. She denies chest pain or shortness of breath. She feels her heart pounding hard in her chest.   She just took her nighttime medications including verapamil and metoprolol. She was encouraged to take an extra dose of metoprolol. She will recheck her BP in an hour. She was encouraged to go to the ED with any persistent or severe symptoms.

## 2018-07-03 ENCOUNTER — Telehealth: Payer: Self-pay | Admitting: Cardiovascular Disease

## 2018-07-03 MED ORDER — METOPROLOL SUCCINATE ER 100 MG PO TB24: 100.0000 mg | ORAL_TABLET | Freq: Two times a day (BID) | ORAL | 1 refills | Status: DC

## 2018-07-03 NOTE — Telephone Encounter (Signed)
Pt made aware of Dr.Croitoru's response below. Pt is agreeable with the plan. She will increase Metoprolol to 100mg  bid and will continue monitor BP and symptoms. She understands to contact the office if she experiences the symptoms Dr.Croitoru listed below. She rqst a new Rx be sent to her pharmacy, she will contact them when she needs it filed. Pt verbalized understanding to the instructions given below and voiced appreciation for the assistance.

## 2018-07-03 NOTE — Telephone Encounter (Signed)
I would suggest that she increase her metoprolol to 100 mg twice daily, but let us know if she has problems with fatigue or dizziness (slow heart rate or low blood pressure) or wheezing (worsening asthma). It would be perfectly understandable if the increased anxiety about the current health issues are causing an increase in her BP and an adrenalin blocking agent like metoprolol is the ideal solution for that... MCr

## 2018-07-03 NOTE — Telephone Encounter (Signed)
New Message      Patient Ie-maiiled in her b/p readings  *Caution - External email - see footer for warnings*  Dr. Renaee Munda 3/9.  AM.  145/90-79           PM.   027/25-36 3/10 AM.   644/03-47                      138/75-78            PM.   128/82-83                       425/95-63 3/12.  AM.   875/64-33                        295/18-84              PM.  166/06-30 3/13.   AM.  160/10-93 3/14.   7:45 235/573-22                         155/97-89                          162/136-88             9:45.   135/78-95             10:10.  122/9982                            025/42-70                            129/95-74 3/15.    9am.   144/104-72                            140/92-70             12:30.   147/88-78               7:30.   623:76-28                             125/79-70            8:45.      149/73-70                             143/79/70 3/16. 9 am.     315/17-61                            123/78-67            9pm.      607/37-10 3/17. 8:30pm 626/94-85 3/18. 9PM.       462/70-35 3/19 8:30 pm. 164/112-81   After meds.     009/38-18       3/20.      9am.   136/88-69    After meds             11:30.   135/83-70                             Truddie Coco 2046/09/12 [phone]  Sent from my

## 2018-07-03 NOTE — Telephone Encounter (Signed)
Contacted the pt. Adv her that her BP readings were received and that I will fwd them to Dr.Croitoru for review. Adv her that I wanted to touch base with her before fwd the message. Pt sts that she did call into our on call service on the evening of 06/27/18 to report her BP being elevated and she was feeling poorly. She was instructed to take an additional 1/2 tab of Metoprolol. She sts that she did feel better shortly after. She did take an additional 1/2 of Metoprolol last night as well also with improvement. She does feel that her AFIB is controlled but wonders if her medications may need tweaking due to her elevated diastolic BP. Adv her that I will fwd the update to Dr. Sallyanne [facility] and we will call back with his recommendation. Pt verbalized understanding and voiced appreciation for the call.

## 2018-07-06 ENCOUNTER — Other Ambulatory Visit: Payer: Self-pay | Admitting: Cardiovascular Disease

## 2018-07-07 NOTE — Telephone Encounter (Signed)
73 yo, 125.5kg, Scr 1.12 on 09/15/17 scan Last OV 12/23/17

## 2018-07-30 ENCOUNTER — Other Ambulatory Visit: Payer: Self-pay | Admitting: Cardiovascular Disease

## 2018-08-04 DIAGNOSIS — G4733 Obstructive sleep apnea (adult) (pediatric): Secondary | ICD-10-CM | POA: Diagnosis not present

## 2018-08-05 DIAGNOSIS — L905 Scar conditions and fibrosis of skin: Secondary | ICD-10-CM | POA: Diagnosis not present

## 2018-08-05 DIAGNOSIS — L7211 Pilar cyst: Secondary | ICD-10-CM | POA: Diagnosis not present

## 2018-09-16 ENCOUNTER — Other Ambulatory Visit: Payer: Self-pay | Admitting: Cardiovascular Disease

## 2018-10-26 ENCOUNTER — Other Ambulatory Visit: Payer: Self-pay | Admitting: Family Medicine

## 2018-10-26 DIAGNOSIS — Z1231 Encounter for screening mammogram for malignant neoplasm of breast: Secondary | ICD-10-CM

## 2018-11-02 DIAGNOSIS — G4733 Obstructive sleep apnea (adult) (pediatric): Secondary | ICD-10-CM | POA: Diagnosis not present

## 2018-11-20 DIAGNOSIS — I10 Essential (primary) hypertension: Secondary | ICD-10-CM | POA: Diagnosis not present

## 2018-11-20 DIAGNOSIS — N183 Chronic kidney disease, stage 3 (moderate): Secondary | ICD-10-CM | POA: Diagnosis not present

## 2018-11-20 DIAGNOSIS — E039 Hypothyroidism, unspecified: Secondary | ICD-10-CM | POA: Diagnosis not present

## 2018-11-20 DIAGNOSIS — E559 Vitamin D deficiency, unspecified: Secondary | ICD-10-CM | POA: Diagnosis not present

## 2018-11-20 DIAGNOSIS — E7800 Pure hypercholesterolemia, unspecified: Secondary | ICD-10-CM | POA: Diagnosis not present

## 2018-11-20 DIAGNOSIS — L280 Lichen simplex chronicus: Secondary | ICD-10-CM | POA: Diagnosis not present

## 2018-12-25 DIAGNOSIS — D2261 Melanocytic nevi of right upper limb, including shoulder: Secondary | ICD-10-CM | POA: Diagnosis not present

## 2018-12-25 DIAGNOSIS — I788 Other diseases of capillaries: Secondary | ICD-10-CM | POA: Diagnosis not present

## 2018-12-25 DIAGNOSIS — D225 Melanocytic nevi of trunk: Secondary | ICD-10-CM | POA: Diagnosis not present

## 2018-12-25 DIAGNOSIS — L821 Other seborrheic keratosis: Secondary | ICD-10-CM | POA: Diagnosis not present

## 2018-12-25 DIAGNOSIS — L918 Other hypertrophic disorders of the skin: Secondary | ICD-10-CM | POA: Diagnosis not present

## 2018-12-25 DIAGNOSIS — L738 Other specified follicular disorders: Secondary | ICD-10-CM | POA: Diagnosis not present

## 2018-12-25 DIAGNOSIS — D2372 Other benign neoplasm of skin of left lower limb, including hip: Secondary | ICD-10-CM | POA: Diagnosis not present

## 2018-12-25 DIAGNOSIS — L718 Other rosacea: Secondary | ICD-10-CM | POA: Diagnosis not present

## 2018-12-25 DIAGNOSIS — D1801 Hemangioma of skin and subcutaneous tissue: Secondary | ICD-10-CM | POA: Diagnosis not present

## 2018-12-25 DIAGNOSIS — L814 Other melanin hyperpigmentation: Secondary | ICD-10-CM | POA: Diagnosis not present

## 2018-12-25 DIAGNOSIS — L820 Inflamed seborrheic keratosis: Secondary | ICD-10-CM | POA: Diagnosis not present

## 2018-12-30 ENCOUNTER — Other Ambulatory Visit: Payer: Self-pay

## 2018-12-30 ENCOUNTER — Ambulatory Visit
Admission: RE | Admit: 2018-12-30 | Discharge: 2018-12-30 | Disposition: A | Discharge status: Home / Self Care | Payer: PPO | Source: Ambulatory Visit | Attending: Internal Medicine | Admitting: Internal Medicine

## 2018-12-30 DIAGNOSIS — Z1231 Encounter for screening mammogram for malignant neoplasm of breast: Secondary | ICD-10-CM | POA: Diagnosis not present

## 2019-01-06 ENCOUNTER — Other Ambulatory Visit: Payer: Self-pay

## 2019-01-06 ENCOUNTER — Ambulatory Visit (INDEPENDENT_AMBULATORY_CARE_PROVIDER_SITE_OTHER): Payer: PPO | Admitting: Cardiovascular Disease

## 2019-01-06 VITALS — BP 124/70 | HR 77 | Temp 98.4°F | Ht 66.0 in | Wt 277.8 lb

## 2019-01-06 DIAGNOSIS — I480 Paroxysmal atrial fibrillation: Secondary | ICD-10-CM | POA: Diagnosis not present

## 2019-01-06 DIAGNOSIS — G4733 Obstructive sleep apnea (adult) (pediatric): Secondary | ICD-10-CM

## 2019-01-06 DIAGNOSIS — I352 Nonrheumatic aortic (valve) stenosis with insufficiency: Secondary | ICD-10-CM

## 2019-01-06 DIAGNOSIS — I340 Nonrheumatic mitral (valve) insufficiency: Secondary | ICD-10-CM | POA: Diagnosis not present

## 2019-01-06 DIAGNOSIS — I10 Essential (primary) hypertension: Secondary | ICD-10-CM | POA: Diagnosis not present

## 2019-01-06 DIAGNOSIS — Z7901 Long term (current) use of anticoagulants: Secondary | ICD-10-CM

## 2019-01-06 NOTE — Patient Instructions (Signed)

## 2019-01-06 NOTE — Progress Notes (Signed)
Cardiology Office Note:    Date:  01/07/2019   ID:  [name], DOB 12/15/1945, MRN [id number]  PCP:  Glenis Smoker, MD  Cardiologist:  Sanda Klein, MD    Referring MD: No ref. provider found   Chief Complaint  Patient presents with  . Atrial Fibrillation    History of Present Illness:    Leslie [name] is a 73 y.o. female with a hx of Persistent atrial fibrillation returning for follow-up. She presented in April 2018 and it was very difficult to achieve good rate control and she eventually underwent TEE guided cardioversion. In May 2018 she had junctional rhythm and her dose of beta blocker was decreased.  Additional problems include morbid obesity, obstructive sleep apnea on CPAP and treated hypertension. Echocardiography showed moderate aortic insufficiency and mild mitral regurgitation, neither one of which is felt to be the cause of her arrhythmia.   She does not really have any cardiovascular complaints since her last office visit.  She only rarely notices palpitations and these have bothered her less than in the past.  She denies dyspnea at rest or with usual activity and has not had chest discomfort or leg edema or syncope.  She is compliant with Eliquis anticoagulation and has not had falls, injuries or serious bleeding problems.  Her blood pressure is well controlled.  Her recent labs show mild hypertriglyceridemia and low HDL, but an acceptable LDL level.  Past Medical History:  Diagnosis Date  . Adenomatous polyp   . Arthritis    "knees, legs, fingers" (07/30/2016)  . Asthma   . Bursitis of left shoulder    "just finished PT" (07/30/2016)  . Chest pain    a. 2003 Abnl stress test-->Cath: nonobs CAD.  Marland Kitchen Chronic bronchitis ([place])   . Chronic kidney disease    stage 3  . GERD (gastroesophageal reflux disease)    barrett's esophagus- Dr. Cristina Gong  . History of hiatal hernia   . Hyperlipidemia   . Hypertension   . Hypothyroidism   . Migraines    "sporatic; at least a few/year" (07/30/2016)  . Mitral regurgitation    a. 07/2016 Echo: mild to mod MR;  b. 07/2016 TEE: mild MR.  . Moderate aortic insufficiency    a. 07/2016 Echo: EF 55-60%, mod AI;  b. 07/2016 TEE: EF 50-55%, mod AI.  Marland Kitchen Obesity    s/p lap band surgery 6/10  . OSA on CPAP   . PAF (paroxysmal atrial fibrillation) ([place])    a. 07/2016 TEE/DCCV: EF 50-55%, mild MR, mod AI, mild to mod TR, neg bubble study-->successful DCCV x1 w/ 120J.  Marland Kitchen PONV (postoperative nausea and vomiting)   . Vitamin D deficiency     Past Surgical History:  Procedure Laterality Date  . APPENDECTOMY     incidental  . CARDIOVERSION N/A 08/01/2016   Procedure: CARDIOVERSION;  Surgeon: Jerline Pain, MD;  Location: [facility];  Service: Cardiovascular;  Laterality: N/A;  . CATARACT EXTRACTION W/ INTRAOCULAR LENS  IMPLANT, BILATERAL Bilateral ~ 2008-2017   left - right  . COMBINED HYSTERECTOMY VAGINAL / OOPHORECTOMY / A&P REPAIR  1981  . HERNIA REPAIR    . INGUINAL HERNIA REPAIR Left 1952  . LAPAROSCOPIC GASTRIC BANDING WITH HIATAL HERNIA REPAIR  09/2008  . TEE WITHOUT CARDIOVERSION N/A 08/01/2016   Procedure: TRANSESOPHAGEAL ECHOCARDIOGRAM (TEE);  Surgeon: Jerline Pain, MD;  Location: [facility];  Service: Cardiovascular;  Laterality: N/A;  . [place]  . VAGINAL HYSTERECTOMY  1981   "w/1 ovary"    Current Medications: Current Meds  Medication Sig  . acetaminophen (TYLENOL) 500 MG tablet Take 500-1,000 mg by mouth every 6 (six) hours as needed (for pain).  Marland Kitchen albuterol (PROVENTIL HFA;VENTOLIN HFA) 108 (90 BASE) MCG/ACT inhaler Inhale 1-2 puffs into the lungs every 6 (six) hours as needed for wheezing or shortness of breath.  Marland Kitchen atorvastatin (LIPITOR) 10 MG tablet Take 10 mg by mouth daily.  . Azelaic Acid-Cleanser-Lotion (FINACEA PLUS) 15 % KIT Apply 1 application topically daily.   . cholecalciferol (VITAMIN D) 1000 UNITS tablet Take 1,000 Units by mouth daily.  Marland Kitchen  ELIQUIS 5 MG TABS tablet TAKE 1 TABLET(5 MG) BY MOUTH TWICE DAILY  . fexofenadine (ALLEGRA) 180 MG tablet Take 180 mg by mouth daily.  . fluticasone (FLONASE) 50 MCG/ACT nasal spray Place 2 sprays into both nostrils daily.  . fluticasone (FLOVENT HFA) 44 MCG/ACT inhaler Inhale 2 puffs into the lungs 2 (two) times daily.  . furosemide (LASIX) 20 MG tablet Take 20 mg by mouth every morning.  . Glucosamine-Chondroitin (OSTEO BI-FLEX REGULAR STRENGTH PO) Take 1 tablet by mouth daily.  . irbesartan (AVAPRO) 150 MG tablet TAKE 1 TABLET(150 MG) BY MOUTH DAILY  . levothyroxine (SYNTHROID, LEVOTHROID) 50 MCG tablet Take 50 mcg by mouth daily before breakfast.  . magnesium oxide (MAGNESIUM-OXIDE) 400 (241.3 Mg) MG tablet Take 1 tablet (400 mg total) by mouth daily.  . metoprolol succinate (TOPROL-XL) 100 MG 24 hr tablet Take 1 tablet (100 mg total) by mouth 2 (two) times daily. Take with or immediately following a meal.  . Multiple Vitamins-Minerals (CENTRUM SILVER ADULT 50+ PO) Take 1 tablet by mouth daily.   Marland Kitchen omeprazole (PRILOSEC) 20 MG capsule Take 40 mg by mouth daily before breakfast.  . verapamil (CALAN-SR) 180 MG CR tablet Take 180 mg by mouth 2 (two) times daily.  . Vitamin D, Ergocalciferol, (DRISDOL) 50000 UNITS CAPS capsule Take 50,000 Units by mouth every 7 (seven) days.     Allergies:   Augmentin [amoxicillin-pot clavulanate], Codeine, Demerol [meperidine], and Sulfa antibiotics   Social History   Socioeconomic History  . Marital status: Divorced    Spouse name: Not on file  . Number of children: Not on file  . Years of education: Not on file  . Highest education level: Not on file  Occupational History  . Not on file  Social Needs  . Financial resource strain: Not on file  . Food insecurity    Worry: Not on file    Inability: Not on file  . Transportation needs    Medical: Not on file    Non-medical: Not on file  Tobacco Use  . Smoking status: Former Research scientist (life sciences)  . Smokeless  tobacco: Never Used  . Tobacco comment: 07/30/2016 "social smoker only; in college"  Substance and Sexual Activity  . Alcohol use: Yes    Comment: 07/30/2016 "couple drinks/month"  . Drug use: No  . Sexual activity: Not Currently  Lifestyle  . Physical activity    Days per week: Not on file    Minutes per session: Not on file  . Stress: Not on file  Relationships  . Social Herbalist on phone: Not on file    Gets together: Not on file    Attends religious service: Not on file    Active member of club or organization: Not on file    Attends meetings of clubs or organizations: Not on file  Relationship status: Not on file  Other Topics Concern  . Not on file  Social History Narrative  . Not on file     Family History: The patient's family history includes Emphysema in her father; Heart failure in her mother; Hypertension in her father and mother. ROS:   Please see the history of present illness.    All other systems are reviewed and are negative  EKGs/Labs/Other Studies Reviewed:     EKG: EKG is ordered today and shows normal sinus rhythm, QTC 452 ms, normal tracing.  Recent Labs: November 20, 2018 creatinine 1.28, potassium 4.6, normal TSH  Recent Lipid Panel November 20, 2018 total cholesterol 166, HDL 36, LDL 95, triglycerides 176     Component Value Date/Time   CHOL 146 07/31/2016 0435   TRIG 174 (H) 07/31/2016 0435   HDL 32 (L) 07/31/2016 0435   CHOLHDL 4.6 07/31/2016 0435   VLDL 35 07/31/2016 0435   LDLCALC 79 07/31/2016 0435    Physical Exam:    VS:  BP 124/70   Pulse 77   Temp 98.4 F (36.9 C) (Temporal)   Ht '5\' 6"'$  (1.676 m)   Wt 277 lb 12.8 oz (126 kg)   SpO2 98%   BMI 44.84 kg/m     Wt Readings from Last 3 Encounters:  01/06/19 277 lb 12.8 oz (126 kg)  12/23/17 276 lb 9.6 oz (125.5 kg)  11/15/16 289 lb 6.4 oz (131.3 kg)      General: Alert, oriented x3, no distress, morbidly obese Head: no evidence of trauma, PERRL, EOMI, no  exophtalmos or lid lag, no myxedema, no xanthelasma; normal ears, nose and oropharynx Neck: normal jugular venous pulsations and no hepatojugular reflux; brisk carotid pulses without delay.  There is a distant right carotid bruit that seems to be radiating from the chest Chest: clear to auscultation, no signs of consolidation by percussion or palpation, normal fremitus, symmetrical and full respiratory excursions Cardiovascular: normal position and quality of the apical impulse, regular rhythm, normal first and second heart sounds, grade 1/6 early peaking systolic ejection murmur at the right upper sternal border no diastolic murmurs, rubs or gallops Abdomen: no tenderness or distention, no masses by palpation, no abnormal pulsatility or arterial bruits, normal bowel sounds, no hepatosplenomegaly Extremities: no clubbing, cyanosis or edema; 2+ radial, ulnar and brachial pulses bilaterally; 2+ right femoral, posterior tibial and dorsalis pedis pulses; 2+ left femoral, posterior tibial and dorsalis pedis pulses; no subclavian or femoral bruits Neurological: grossly nonfocal Psych: Normal mood and affect    ASSESSMENT:    1. Paroxysmal atrial fibrillation (HCC)   2. Long term current use of anticoagulant   3. Essential hypertension   4. OSA (obstructive sleep apnea)   5. Morbid obesity ([place])   6. Nonrheumatic mitral (valve) insufficiency   7. Nonrheumatic aortic insufficiency with aortic stenosis    PLAN:    In order of problems listed above:  1. AFib: Essentially asymptomatic.  No clinically relevant recurrence.  CHADSVasc 3 (age, gender, HTN). 2. Anticoagulation: Compliant, no bleeding complications 3. HTN: Very well controlled  4. OSA:  reports 100% compliance and benefit from treatment 5. Obesity: Weight loss would help with reduction in the likelihood/overall burden of recurrent atrial fibrillation 6. MR/AR: We will plan to repeat her echocardiogram this year, but in view of the  coronavirus pandemic will delay for another year 7. R carotid bruit: No evidence of carotid stenosis by ultrasonography, on today's exam seems to be more clearly radiating from  the chest.  Medication Adjustments/Labs and Tests Ordered: Current medicines are reviewed at length with the patient today.  Concerns regarding medicines are outlined above.  Orders Placed This Encounter  Procedures  . EKG 12-Lead   No orders of the defined types were placed in this encounter.  Patient Instructions  Medication Instructions:  Your physician recommends that you continue on your current medications as directed. Please refer to the Current Medication list given to you today.  If you need a refill on your cardiac medications before your next appointment, please call your pharmacy.   Lab work: None ordered If you have labs (blood work) drawn today and your tests are completely normal, you will receive your results only by: [place] (if you have MyChart) OR A paper copy in the mail If you have any lab test that is abnormal or we need to change your treatment, we will call you to review the results.  Testing/Procedures: None ordered  Follow-Up: At [facility], you and your health needs are our priority.  As part of our continuing mission to provide you with exceptional heart care, we have created designated Provider Care Teams.  These Care Teams include your primary Cardiologist (physician) and Advanced Practice Providers (APPs -  Physician Assistants and Nurse Practitioners) who all work together to provide you with the care you need, when you need it. You will need a follow up appointment in 12 months.  Please call our office 2 months in advance to schedule this appointment.  You may see Sanda Klein, MD or one of the following Advanced Practice Providers on your designated Care Team: Almyra Deforest, PA-C Fabian Sharp, PA-C         Signed, Sanda Klein, MD  01/07/2019 2:17 PM    [place]

## 2019-01-07 ENCOUNTER — Encounter: Payer: Self-pay | Admitting: Cardiovascular Disease

## 2019-01-11 ENCOUNTER — Other Ambulatory Visit: Payer: Self-pay | Admitting: Cardiovascular Disease

## 2019-01-11 ENCOUNTER — Other Ambulatory Visit: Payer: Self-pay | Admitting: *Deleted

## 2019-01-11 MED ORDER — APIXABAN 5 MG PO TABS: 5.0000 mg | ORAL_TABLET | Freq: Two times a day (BID) | ORAL | 2 refills | Status: DC

## 2019-01-11 NOTE — Telephone Encounter (Signed)
Rx has been sent to the pharmacy electronically. ° °

## 2019-02-04 DIAGNOSIS — G4733 Obstructive sleep apnea (adult) (pediatric): Secondary | ICD-10-CM | POA: Diagnosis not present

## 2019-02-06 ENCOUNTER — Encounter (INDEPENDENT_AMBULATORY_CARE_PROVIDER_SITE_OTHER): Payer: Self-pay

## 2019-02-23 DIAGNOSIS — K2270 Barrett's esophagus without dysplasia: Secondary | ICD-10-CM | POA: Diagnosis not present

## 2019-02-23 DIAGNOSIS — Z86010 Personal history of colonic polyps: Secondary | ICD-10-CM | POA: Diagnosis not present

## 2019-02-23 DIAGNOSIS — R198 Other specified symptoms and signs involving the digestive system and abdomen: Secondary | ICD-10-CM | POA: Diagnosis not present

## 2019-02-23 DIAGNOSIS — K219 Gastro-esophageal reflux disease without esophagitis: Secondary | ICD-10-CM | POA: Diagnosis not present

## 2019-04-02 ENCOUNTER — Ambulatory Visit: Payer: PPO | Attending: Internal Medicine

## 2019-04-02 DIAGNOSIS — Z20822 Contact with and (suspected) exposure to covid-19: Secondary | ICD-10-CM

## 2019-04-02 DIAGNOSIS — Z20828 Contact with and (suspected) exposure to other viral communicable diseases: Secondary | ICD-10-CM | POA: Diagnosis not present

## 2019-04-04 LAB — NOVEL CORONAVIRUS, NAA: SARS-CoV-2, NAA: NOT DETECTED

## 2019-04-05 DIAGNOSIS — Z20828 Contact with and (suspected) exposure to other viral communicable diseases: Secondary | ICD-10-CM | POA: Diagnosis not present

## 2019-05-06 DIAGNOSIS — G4733 Obstructive sleep apnea (adult) (pediatric): Secondary | ICD-10-CM | POA: Diagnosis not present

## 2019-05-11 ENCOUNTER — Ambulatory Visit: Payer: PPO

## 2019-05-20 ENCOUNTER — Ambulatory Visit: Payer: PPO | Attending: Internal Medicine

## 2019-05-20 DIAGNOSIS — Z23 Encounter for immunization: Secondary | ICD-10-CM | POA: Insufficient documentation

## 2019-05-20 NOTE — Progress Notes (Signed)
   Covid-19 Vaccination Clinic  Name:  Leslie [name]    MRN: [id number] DOB: May 22, 1945  05/20/2019  [name] was observed post Covid-19 immunization for 15 minutes without incidence. She was provided with Vaccine Information Sheet and instruction to access the V-Safe system.   [name] was instructed to call 911 with any severe reactions post vaccine: Marland Kitchen Difficulty breathing  . Swelling of your face and throat  . A fast heartbeat  . A bad rash all over your body  . Dizziness and weakness    Immunizations Administered    Name Date Dose VIS Date Route   Pfizer COVID-19 Vaccine 05/20/2019  5:43 PM 0.3 mL 03/26/2019 Intramuscular   Manufacturer: [place]   Lot: [id number]   [place]: [id number]

## 2019-05-28 ENCOUNTER — Ambulatory Visit: Payer: PPO

## 2019-06-07 ENCOUNTER — Other Ambulatory Visit: Payer: Self-pay

## 2019-06-07 MED ORDER — METOPROLOL SUCCINATE ER 100 MG PO TB24: 100.0000 mg | ORAL_TABLET | Freq: Two times a day (BID) | ORAL | 1 refills | Status: DC

## 2019-06-15 ENCOUNTER — Ambulatory Visit: Payer: PPO | Attending: Internal Medicine

## 2019-06-15 DIAGNOSIS — Z23 Encounter for immunization: Secondary | ICD-10-CM | POA: Insufficient documentation

## 2019-06-15 NOTE — Progress Notes (Signed)
   Covid-19 Vaccination Clinic  Name:  LAVREN CHIAPPONE    MRN: [id number] DOB: [birth year]  06/15/2019  Ms. Lashure was observed post Covid-19 immunization for 15 minutes without incident. She was provided with Vaccine Information Sheet and instruction to access the V-Safe system.   Ms. Kuhnke was instructed to call 911 with any severe reactions post vaccine: Marland Kitchen Difficulty breathing  . Swelling of face and throat  . A fast heartbeat  . A bad rash all over body  . Dizziness and weakness   Immunizations Administered    Name Date Dose VIS Date Route   Pfizer COVID-19 Vaccine 06/15/2019 11:23 AM 0.3 mL 03/26/2019 Intramuscular   Manufacturer: [place]   Lot: [id number]   [place]: [id number]

## 2019-06-19 ENCOUNTER — Telehealth: Payer: Self-pay | Admitting: Physician Assistant

## 2019-06-19 NOTE — Telephone Encounter (Signed)
74 year old female with a history of paroxysmal atrial fibrillation. She is on Apixaban, verapamil and metoprolol succinate. She recently had her second COVID-19 vaccine. She felt poorly afterwards and thought it was related to the vaccine. However, she started checking her blood pressure yesterday and noted her heart rate was elevated. Her blood pressures were also elevated. She got her apple watch set up today and has been alerted 4 times that she is in atrial fibrillation. Her average heart rate this morning is 101. She has not had chest pain, significant shortness of breath, orthopnea, syncope near syncope. She usually takes metoprolol succinate 100 mg in the morning and 50 mg in the evening. She took 100 mg last night. PLAN: 1. I have advised her to increase her metoprolol succinate to 100 mg twice a day 2. I will send a note to the office to have her scheduled either with Dr. Sallyanne Kuster, one of the APPs or the A. fib clinic on Monday to see if she needs to be set up for cardioversion. 3. I have advised her to go to the emergency room or call back if her symptoms worsen over the weekend. Richardson Dopp, PA-C    06/19/2019 11:38 AM

## 2019-06-21 ENCOUNTER — Encounter: Payer: Self-pay | Admitting: Physician Assistant

## 2019-06-21 ENCOUNTER — Other Ambulatory Visit: Payer: Self-pay

## 2019-06-21 ENCOUNTER — Telehealth: Payer: Self-pay

## 2019-06-21 ENCOUNTER — Other Ambulatory Visit: Payer: Self-pay | Admitting: Physician Assistant

## 2019-06-21 ENCOUNTER — Ambulatory Visit: Payer: PPO | Admitting: Physician Assistant

## 2019-06-21 VITALS — BP 112/56 | HR 89 | Ht 66.0 in | Wt 281.8 lb

## 2019-06-21 DIAGNOSIS — Z7901 Long term (current) use of anticoagulants: Secondary | ICD-10-CM | POA: Diagnosis not present

## 2019-06-21 DIAGNOSIS — I4819 Other persistent atrial fibrillation: Secondary | ICD-10-CM | POA: Diagnosis not present

## 2019-06-21 DIAGNOSIS — I10 Essential (primary) hypertension: Secondary | ICD-10-CM

## 2019-06-21 NOTE — Progress Notes (Signed)
Cardiology Office Note   Date:  06/21/2019   ID:  Leslie, [name] [birth year], MRN [id number]  PCP:  Leslie Smoker, MD Cardiologist:  Leslie Klein, MD 01/06/2019 Electrphysiologist: None Leslie Ferries, PA-C   No chief complaint on file.   History of Present Illness: Leslie [name] is a 74 y.o. female with a history of PAF s/p TEE/DCCV 2018 on verapamil, Toprol-XL & Eliquis, OA, CKD III, GERD, HTN, HLD, mod MR/AI, Hypothyroid, OSA on CPAP  03/06 phone note regarding patient having gotten her second Covid vaccine, her watch is alerting that she is in atrial fibrillation, Toprol-XL increased to 100 mg twice daily, appointment made  Leslie [name] presents for cardiology follow up.  She started feeling bad last week, the day after her COVID shot. She continued to feel weak and have tachypalps. When she went to the grocery store, she was exhausted getting back to the car.   That night, she started checking her BP, her HR was consistently > 100, and up to 132. Her SBP was well-controlled but DBP was elevated, >100 at times.   She has an Apple watch and started checking her HR/rhythm with it. It told her she was in Atrial fib, and her heart rate was elevated.   She has been taking the higher dose of of BB for 3 days now. Although her HR is better controlled, she still feels bad.   She still feels weak, gets DOE. No chest [name]. Feels the palpitations even when her HR is ok.   She is also having chest tightness, it started last Thursday. She doesn't feel it all the time. It is mild and she will notice it if she thinks about it, but not otherwise.  There is no exertional component.  She is very sure she has not missed any doses of her Eliquis in the past month.   She has lower extremity edema that is chronic and at baseline.   Past Medical History:  Diagnosis Date  . Adenomatous polyp   . Arthritis    "knees, legs, fingers" (07/30/2016)  . Asthma   . Bursitis of  left shoulder    "just finished PT" (07/30/2016)  . Chest [name]    a. 2003 Abnl stress test-->Cath: nonobs CAD.  Leslie [name] Chronic bronchitis ([place])   . Chronic kidney disease    stage 3  . GERD (gastroesophageal reflux disease)    Leslie [name]'s esophagus- Dr. Cristina [name]  . History of hiatal hernia   . Hyperlipidemia   . Hypertension   . Hypothyroidism   . Migraines    "sporatic; at least a few/year" (07/30/2016)  . Mitral regurgitation    a. 07/2016 Echo: mild to mod MR;  b. 07/2016 TEE: mild MR.  . Moderate aortic insufficiency    a. 07/2016 Echo: EF 55-60%, mod AI;  b. 07/2016 TEE: EF 50-55%, mod AI.  Leslie [name] Obesity    s/p lap band surgery 6/10  . OSA on CPAP   . PAF (paroxysmal atrial fibrillation) ([place])    a. 07/2016 TEE/DCCV: EF 50-55%, mild MR, mod AI, mild to mod TR, neg bubble study-->successful DCCV x1 w/ 120J.  Leslie [name] PONV (postoperative nausea and vomiting)   . Vitamin D deficiency     Past Surgical History:  Procedure Laterality Date  . APPENDECTOMY     incidental  . CARDIOVERSION N/A 08/01/2016   Procedure: CARDIOVERSION;  Surgeon: Leslie Pain, MD;  Location: [facility];  Service: Cardiovascular;  Laterality: N/A;  .  CATARACT EXTRACTION W/ INTRAOCULAR LENS  IMPLANT, BILATERAL Bilateral ~ 2008-2017  ° left - right  °• COMBINED HYSTERECTOMY VAGINAL / OOPHORECTOMY / A&P REPAIR  1981  °• HERNIA REPAIR    °• INGUINAL HERNIA REPAIR Left 1952  °• LAPAROSCOPIC GASTRIC BANDING WITH HIATAL HERNIA REPAIR  09/2008  °• TEE WITHOUT CARDIOVERSION N/A 08/01/2016  ° Procedure: TRANSESOPHAGEAL ECHOCARDIOGRAM (TEE);  Surgeon: Leslie C Skains, MD;  Location: [facility];  Service: Cardiovascular;  Laterality: N/A;  °• TONSILLECTOMY AND ADENOIDECTOMY  1974  °• VAGINAL HYSTERECTOMY  1981  ° "w/1 ovary"  ° ° °Current Outpatient Medications  °Medication Sig Dispense Refill  °• acetaminophen (TYLENOL) 500 MG tablet Take 500-1,000 mg by mouth every 6 (six) hours as needed (for [name]).    °• albuterol (PROVENTIL HFA;VENTOLIN  HFA) 108 (90 BASE) MCG/ACT inhaler Inhale 1-2 puffs into the lungs every 6 (six) hours as needed for wheezing or shortness of breath.    °• apixaban (ELIQUIS) 5 MG TABS tablet Take 1 tablet (5 mg total) by mouth 2 (two) times daily. 180 tablet 2  °• atorvastatin (LIPITOR) 10 MG tablet Take 10 mg by mouth daily.    °• Azelaic Acid-Cleanser-Lotion (FINACEA PLUS) 15 % KIT Apply 1 application topically daily.     °• cholecalciferol (VITAMIN D) 1000 UNITS tablet Take 1,000 Units by mouth daily.    °• fexofenadine (ALLEGRA) 180 MG tablet Take 180 mg by mouth daily.    °• fluticasone (FLONASE) 50 MCG/ACT nasal spray Place 2 sprays into both nostrils daily.    °• fluticasone (FLOVENT HFA) 44 MCG/ACT inhaler Inhale 2 puffs into the lungs 2 (two) times daily. 1 Inhaler 0  °• furosemide (LASIX) 20 MG tablet Take 20 mg by mouth every morning.    °• Glucosamine-Chondroitin (OSTEO BI-FLEX REGULAR STRENGTH PO) Take 1 tablet by mouth daily.    °• irbesartan (AVAPRO) 150 MG tablet TAKE 1 TABLET(150 MG) BY MOUTH DAILY 90 tablet 3  °• levothyroxine (SYNTHROID, LEVOTHROID) 50 MCG tablet Take 50 mcg by mouth daily before breakfast.    °• MAGNESIUM-OXIDE 400 (241.3 Mg) MG tablet TAKE 1 TABLET BY MOUTH DAILY 90 tablet 3  °• metoprolol succinate (TOPROL-XL) 100 MG 24 hr tablet Take 1 tablet (100 mg total) by mouth 2 (two) times daily. Take with or immediately following a meal. 180 tablet 1  °• Multiple Vitamins-Minerals (CENTRUM SILVER ADULT 50+ PO) Take 1 tablet by mouth daily.     °• omeprazole (PRILOSEC) 20 MG capsule Take 40 mg by mouth daily before breakfast.    °• verapamil (CALAN-SR) 180 MG CR tablet Take 180 mg by mouth 2 (two) times daily.    °• Vitamin D, Ergocalciferol, (DRISDOL) 50000 UNITS CAPS capsule Take 50,000 Units by mouth every 7 (seven) days.    ° °No current facility-administered medications for this visit.  ° ° °Allergies:   Augmentin [amoxicillin-pot clavulanate], Codeine, Demerol [meperidine], and Sulfa antibiotics   ° ° °Social History:  The patient  reports that she has quit smoking. She has never used smokeless tobacco. She reports current alcohol use. She reports that she does not use drugs.  ° °Family History:  The patient's family history includes Emphysema in her father; Heart failure in her mother; Hypertension in her father and mother.  °She indicated that her mother is deceased. She indicated that her father is deceased. She indicated that her brother is alive. ° ° ° °ROS:  Please see the history of present illness. All other systems are   reviewed and negative.  ° ° °PHYSICAL EXAM: °VS:  BP (!) 112/56 (BP Location: Left Wrist)    Pulse 89    Ht 5' 6" (1.676 m)    Wt 281 lb 12.8 oz (127.8 kg)    BMI 45.48 kg/m²  , BMI Body mass index is 45.48 kg/m². °GEN: Well nourished, well developed, female in no acute distress °HEENT: normal for age  °Neck: no JVD, no carotid bruit, no masses °Cardiac: Irregular R&R; no murmur, no rubs, or gallops °Respiratory:  clear to auscultation bilaterally, normal work of breathing °GI: soft, nontender, nondistended, + BS °MS: no deformity or atrophy; trace-1+ edema; distal pulses are 2+ in all 4 extremities  °Skin: warm and dry, no rash °Neuro:  Strength and sensation are intact °Psych: euthymic mood, full affect ° ° °EKG:  EKG is ordered today. °The ekg ordered today demonstrates atrial fibrillation, heart rate is 89, no acute ischemic changes.  There may be some mild ST depression, but because of the coarse atrial fib waves, it is not clear ° °TEE: 08/01/2016 °- Left ventricle: Systolic function was normal. The estimated  °  ejection fraction was in the range of 50% to 55%. Wall motion was  °  normal; there were no regional wall motion abnormalities.  °- Aortic valve: There was moderate regurgitation.  °- Mitral valve: There was mild to moderate regurgitation.  °- Left atrium: No evidence of thrombus in the atrial cavity or  °  appendage. No evidence of thrombus in the appendage. No evidence   °  of thrombus in the atrial cavity or appendage.  °- Right atrium: No evidence of thrombus in the atrial cavity or  °  appendage.  ° °ECHO: 07/31/2016 °- Left ventricle: The cavity size was normal. There was moderate  °  concentric hypertrophy. Systolic function was normal. The  °  estimated ejection fraction was in the range of 55% to 60%. Wall  °  motion was normal; there were no regional wall motion  °  abnormalities.  °- Aortic valve: There was moderate regurgitation.  °- Mitral valve: There was mild to moderate regurgitation.  °- Left atrium: The atrium was mildly dilated. Anterior-posterior  °  dimension: 43 mm.  °- Pulmonic valve: There was mild regurgitation.  ° °Recent Labs: °No results found for requested labs within last 8760 hours.  °CBC °   °Component Value Date/Time  ° WBC 20.3 (H) 08/13/2016 1556  ° RBC 5.18 (H) 08/13/2016 1556  ° HGB 14.3 08/13/2016 1556  ° HCT 43.3 08/13/2016 1556  ° PLT 389 08/13/2016 1556  ° MCV 83.6 08/13/2016 1556  ° MCH 27.6 08/13/2016 1556  ° MCHC 33.0 08/13/2016 1556  ° RDW 15.0 08/13/2016 1556  ° LYMPHSABS 2,639 08/13/2016 1556  ° MONOABS 1,015 (H) 08/13/2016 1556  ° EOSABS 0 (L) 08/13/2016 1556  ° BASOSABS 0 08/13/2016 1556  ° °CMP Latest Ref Rng & Units 08/13/2016 07/31/2016 07/30/2016  °Glucose 65 - 99 mg/dL 101(H) 109(H) 101(H)  °BUN 7 - 25 mg/dL 28(H) 11 15  °Creatinine 0.60 - 0.93 mg/dL 1.49(H) 1.04(H) 0.93  °Sodium 135 - 146 mmol/L 139 140 140  °Potassium 3.5 - 5.3 mmol/L 4.4 3.2(L) 3.7  °Chloride 98 - 110 mmol/L 103 105 106  °CO2 20 - 31 mmol/L 23 26 21(L)  °Calcium 8.6 - 10.4 mg/dL 8.6 8.3(L) 8.8(L)  °Total Protein 6.0 - 8.3 g/dL - - -  °Total Bilirubin 0.3 - 1.2 mg/dL - - -  °Alkaline Phos 39 - 117 U/L - - -  °AST 0 - 37 U/L - - -  °ALT 0 -   Lipid Panel Lab Results  Component Value Date   CHOL 146 07/31/2016   HDL 32 (L) 07/31/2016   LDLCALC 79 07/31/2016   TRIG 174 (H) 07/31/2016   CHOLHDL 4.6 07/31/2016      Wt Readings from Last 3  Encounters:  06/21/19 281 lb 12.8 oz (127.8 kg)  01/06/19 277 lb 12.8 oz (126 kg)  12/23/17 276 lb 9.6 oz (125.5 kg)     Other studies Reviewed: Additional studies/ records that were reviewed today include: Office notes, hospital records and testing.  ASSESSMENT AND PLAN:  1.  Persistent atrial fibrillation, RVR -Although her heart rate control improved with a higher dose of beta-blocker, she is still symptomatic from the atrial fibrillation. -Patient discussed with Dr. Sallyanne Kuster.  He recommends getting an echocardiogram and scheduling for cardioversion -Dr. Sallyanne Kuster also reviewed her medications, and recommends that she take her metoprolol the day of the cardioversion, but hold the verapamil -Instructions given to the patient to get a Covid test tomorrow and then quarantine, so the results will be available on Thursday -Ms. Voyles is in agreement with the plan of care  2.  Chronic anticoagulation with Eliquis -Ms. Kroening is emphatic and assuring me that she has not missed any doses of her Eliquis, she states she takes it twice a day every day  3.  Hypertension: -Blood pressure is well controlled today continue Avapro, Lasix, Toprol-XL, and verapamil  4.  Hypothyroidism: -Her TSH is followed by her PCP, but her thyroxine dose has not changed in several years  Current medicines are reviewed at length with the patient today.  The patient does not have concerns regarding medicines.  The following changes have been made:  no change to standing meds, but hold verapamil the day of the cardioversion  Labs/ tests ordered today include:  No orders of the defined types were placed in this encounter.    Disposition:   FU with Leslie Klein, MD  Signed, Leslie Ferries, PA-C  06/21/2019 5:14 PM    [facility] Phone: [phone]; Fax: [phone]

## 2019-06-21 NOTE — Telephone Encounter (Signed)
The patient has an appointment today 3/8 with Rosaria Ferries, PA

## 2019-06-21 NOTE — H&P (View-Only) (Signed)
Cardiology Office Note   Date:  06/21/2019   ID:  Leslie [name], Leslie [name] [birth year], MRN [id number]  PCP:  Glenis Smoker, MD Cardiologist:  Sanda Klein, MD 01/06/2019 Electrphysiologist: None Rosaria Ferries, PA-C   No chief complaint on file.   History of Present Illness: Leslie [name] is a 74 y.o. female with a history of PAF s/p TEE/DCCV 2018 on verapamil, Toprol-XL & Eliquis, OA, CKD III, GERD, HTN, HLD, mod MR/AI, Hypothyroid, OSA on CPAP  03/06 phone note regarding patient having gotten her second Covid vaccine, her watch is alerting that she is in atrial fibrillation, Toprol-XL increased to 100 mg twice daily, appointment made  Leslie [name] presents for cardiology follow up.  She started feeling bad last week, the day after her COVID shot. She continued to feel weak and have tachypalps. When she went to the grocery store, she was exhausted getting back to the car.   That night, she started checking her BP, her HR was consistently > 100, and up to 132. Her SBP was well-controlled but DBP was elevated, >100 at times.   She has an Apple watch and started checking her HR/rhythm with it. It told her she was in Atrial fib, and her heart rate was elevated.   She has been taking the higher dose of of BB for 3 days now. Although her HR is better controlled, she still feels bad.   She still feels weak, gets DOE. No chest pain. Feels the palpitations even when her HR is ok.   She is also having chest tightness, it started last Thursday. She doesn't feel it all the time. It is mild and she will notice it if she thinks about it, but not otherwise.  There is no exertional component.  She is very sure she has not missed any doses of her Eliquis in the past month.   She has lower extremity edema that is chronic and at baseline.   Past Medical History:  Diagnosis Date   Adenomatous polyp    Arthritis    "knees, legs, fingers" (07/30/2016)   Asthma    Bursitis of  left shoulder    "just finished PT" (07/30/2016)   Chest pain    a. 2003 Abnl stress test-->Cath: nonobs CAD.   Chronic bronchitis ([facility])    Chronic kidney disease    stage 3   GERD (gastroesophageal reflux disease)    Jovonte Commins's esophagus- Dr. Cristina Gong   History of hiatal hernia    Hyperlipidemia    Hypertension    Hypothyroidism    Migraines    "sporatic; at least a few/year" (07/30/2016)   Mitral regurgitation    a. 07/2016 Echo: mild to mod MR;  b. 07/2016 TEE: mild MR.   Moderate aortic insufficiency    a. 07/2016 Echo: EF 55-60%, mod AI;  b. 07/2016 TEE: EF 50-55%, mod AI.   Obesity    s/p lap band surgery 6/10   OSA on CPAP    PAF (paroxysmal atrial fibrillation) ([place])    a. 07/2016 TEE/DCCV: EF 50-55%, mild MR, mod AI, mild to mod TR, neg bubble study-->successful DCCV x1 w/ 120J.   PONV (postoperative nausea and vomiting)    Vitamin D deficiency     Past Surgical History:  Procedure Laterality Date   APPENDECTOMY     incidental   CARDIOVERSION N/A 08/01/2016   Procedure: CARDIOVERSION;  Surgeon: Jerline Pain, MD;  Location: [place];  Service: Cardiovascular;  Laterality: N/A;  CATARACT EXTRACTION W/ INTRAOCULAR LENS  IMPLANT, BILATERAL Bilateral ~ 2008-2017   left - right   COMBINED HYSTERECTOMY VAGINAL / OOPHORECTOMY / A&P REPAIR  1981   HERNIA REPAIR     INGUINAL HERNIA REPAIR Left 1952   LAPAROSCOPIC GASTRIC BANDING WITH HIATAL HERNIA REPAIR  09/2008   TEE WITHOUT CARDIOVERSION N/A 08/01/2016   Procedure: TRANSESOPHAGEAL ECHOCARDIOGRAM (TEE);  Surgeon: Jerline Pain, MD;  Location: [place];  Service: Cardiovascular;  Laterality: N/A;   [place]   "w/1 ovary"    Current Outpatient Medications  Medication Sig Dispense Refill   acetaminophen (TYLENOL) 500 MG tablet Take 500-1,000 mg by mouth every 6 (six) hours as needed (for pain).     albuterol (PROVENTIL HFA;VENTOLIN  HFA) 108 (90 BASE) MCG/ACT inhaler Inhale 1-2 puffs into the lungs every 6 (six) hours as needed for wheezing or shortness of breath.     apixaban (ELIQUIS) 5 MG TABS tablet Take 1 tablet (5 mg total) by mouth 2 (two) times daily. 180 tablet 2   atorvastatin (LIPITOR) 10 MG tablet Take 10 mg by mouth daily.     Azelaic Acid-Cleanser-Lotion (FINACEA PLUS) 15 % KIT Apply 1 application topically daily.      cholecalciferol (VITAMIN D) 1000 UNITS tablet Take 1,000 Units by mouth daily.     fexofenadine (ALLEGRA) 180 MG tablet Take 180 mg by mouth daily.     fluticasone (FLONASE) 50 MCG/ACT nasal spray Place 2 sprays into both nostrils daily.     fluticasone (FLOVENT HFA) 44 MCG/ACT inhaler Inhale 2 puffs into the lungs 2 (two) times daily. 1 Inhaler 0   furosemide (LASIX) 20 MG tablet Take 20 mg by mouth every morning.     Glucosamine-Chondroitin (OSTEO BI-FLEX REGULAR STRENGTH PO) Take 1 tablet by mouth daily.     irbesartan (AVAPRO) 150 MG tablet TAKE 1 TABLET(150 MG) BY MOUTH DAILY 90 tablet 3   levothyroxine (SYNTHROID, LEVOTHROID) 50 MCG tablet Take 50 mcg by mouth daily before breakfast.     MAGNESIUM-OXIDE 400 (241.3 Mg) MG tablet TAKE 1 TABLET BY MOUTH DAILY 90 tablet 3   metoprolol succinate (TOPROL-XL) 100 MG 24 hr tablet Take 1 tablet (100 mg total) by mouth 2 (two) times daily. Take with or immediately following a meal. 180 tablet 1   Multiple Vitamins-Minerals (CENTRUM SILVER ADULT 50+ PO) Take 1 tablet by mouth daily.      omeprazole (PRILOSEC) 20 MG capsule Take 40 mg by mouth daily before breakfast.     verapamil (CALAN-SR) 180 MG CR tablet Take 180 mg by mouth 2 (two) times daily.     Vitamin D, Ergocalciferol, (DRISDOL) 50000 UNITS CAPS capsule Take 50,000 Units by mouth every 7 (seven) days.     No current facility-administered medications for this visit.    Allergies:   Augmentin [amoxicillin-pot clavulanate], Codeine, Demerol [meperidine], and Sulfa antibiotics     Social History:  The patient  reports that she has quit smoking. She has never used smokeless tobacco. She reports current alcohol use. She reports that she does not use drugs.   Family History:  The patient's family history includes Emphysema in her father; Heart failure in her mother; Hypertension in her father and mother.  She indicated that her mother is deceased. She indicated that her father is deceased. She indicated that her brother is alive.    ROS:  Please see the history of present illness. All other systems are  reviewed and negative.    PHYSICAL EXAM: VS:  BP (!) 112/56 (BP Location: Left Wrist)    Pulse 89    Ht 5' 6"  (1.676 m)    Wt 281 lb 12.8 oz (127.8 kg)    BMI 45.48 kg/m  , BMI Body mass index is 45.48 kg/m. GEN: Well nourished, well developed, female in no acute distress HEENT: normal for age  Neck: no JVD, no carotid bruit, no masses Cardiac: Irregular R&R; no murmur, no rubs, or gallops Respiratory:  clear to auscultation bilaterally, normal work of breathing GI: soft, nontender, nondistended, + BS MS: no deformity or atrophy; trace-1+ edema; distal pulses are 2+ in all 4 extremities  Skin: warm and dry, no rash Neuro:  Strength and sensation are intact Psych: euthymic mood, full affect   EKG:  EKG is ordered today. The ekg ordered today demonstrates atrial fibrillation, heart rate is 89, no acute ischemic changes.  There may be some mild ST depression, but because of the coarse atrial fib waves, it is not clear  TEE: 08/01/2016 - Left ventricle: Systolic function was normal. The estimated  ejection fraction was in the range of 50% to 55%. Wall motion was  normal; there were no regional wall motion abnormalities.  - Aortic valve: There was moderate regurgitation.  - Mitral valve: There was mild to moderate regurgitation.  - Left atrium: No evidence of thrombus in the atrial cavity or  appendage. No evidence of thrombus in the appendage. No evidence   of thrombus in the atrial cavity or appendage.  - Right atrium: No evidence of thrombus in the atrial cavity or  appendage.   ECHO: 07/31/2016 - Left ventricle: The cavity size was normal. There was moderate  concentric hypertrophy. Systolic function was normal. The  estimated ejection fraction was in the range of 55% to 60%. Wall  motion was normal; there were no regional wall motion  abnormalities.  - Aortic valve: There was moderate regurgitation.  - Mitral valve: There was mild to moderate regurgitation.  - Left atrium: The atrium was mildly dilated. Anterior-posterior  dimension: 43 mm.  - Pulmonic valve: There was mild regurgitation.   Recent Labs: No results found for requested labs within last 8760 hours.  CBC    Component Value Date/Time   WBC 20.3 (H) 08/13/2016 1556   RBC 5.18 (H) 08/13/2016 1556   HGB 14.3 08/13/2016 1556   HCT 43.3 08/13/2016 1556   PLT 389 08/13/2016 1556   MCV 83.6 08/13/2016 1556   MCH 27.6 08/13/2016 1556   MCHC 33.0 08/13/2016 1556   RDW 15.0 08/13/2016 1556   LYMPHSABS 2,639 08/13/2016 1556   MONOABS 1,015 (H) 08/13/2016 1556   EOSABS 0 (L) 08/13/2016 1556   BASOSABS 0 08/13/2016 1556   CMP Latest Ref Rng & Units 08/13/2016 07/31/2016 07/30/2016  Glucose 65 - 99 mg/dL 101(H) 109(H) 101(H)  BUN 7 - 25 mg/dL 28(H) 11 15  Creatinine 0.60 - 0.93 mg/dL 1.49(H) 1.04(H) 0.93  Sodium 135 - 146 mmol/L 139 140 140  Potassium 3.5 - 5.3 mmol/L 4.4 3.2(L) 3.7  Chloride 98 - 110 mmol/L 103 105 106  CO2 20 - 31 mmol/L 23 26 21(L)  Calcium 8.6 - 10.4 mg/dL 8.6 8.3(L) 8.8(L)  Total Protein 6.0 - 8.3 g/dL - - -  Total Bilirubin 0.3 - 1.2 mg/dL - - -  Alkaline Phos 39 - 117 U/L - - -  AST 0 - 37 U/L - - -  ALT 0 -  35 U/L - - -     Lipid Panel Lab Results  Component Value Date   CHOL 146 07/31/2016   HDL 32 (L) 07/31/2016   LDLCALC 79 07/31/2016   TRIG 174 (H) 07/31/2016   CHOLHDL 4.6 07/31/2016      Wt Readings from Last 3  Encounters:  06/21/19 281 lb 12.8 oz (127.8 kg)  01/06/19 277 lb 12.8 oz (126 kg)  12/23/17 276 lb 9.6 oz (125.5 kg)     Other studies Reviewed: Additional studies/ records that were reviewed today include: Office notes, hospital records and testing.  ASSESSMENT AND PLAN:  1.  Persistent atrial fibrillation, RVR -Although her heart rate control improved with a higher dose of beta-blocker, she is still symptomatic from the atrial fibrillation. -Patient discussed with Dr. Sallyanne Kuster.  He recommends getting an echocardiogram and scheduling for cardioversion -Dr. Sallyanne Kuster also reviewed her medications, and recommends that she take her metoprolol the day of the cardioversion, but hold the verapamil -Instructions given to the patient to get a Covid test tomorrow and then quarantine, so the results will be available on Thursday -Ms. Scalf is in agreement with the plan of care  2.  Chronic anticoagulation with Eliquis -Ms. Meisinger is emphatic and assuring me that she has not missed any doses of her Eliquis, she states she takes it twice a day every day  3.  Hypertension: -Blood pressure is well controlled today continue Avapro, Lasix, Toprol-XL, and verapamil  4.  Hypothyroidism: -Her TSH is followed by her PCP, but her thyroxine dose has not changed in several years  Current medicines are reviewed at length with the patient today.  The patient does not have concerns regarding medicines.  The following changes have been made:  no change to standing meds, but hold verapamil the day of the cardioversion  Labs/ tests ordered today include:  No orders of the defined types were placed in this encounter.    Disposition:   FU with Sanda Klein, MD  Signed, Rosaria Ferries, PA-C  06/21/2019 5:14 PM    [facility] Phone: [phone]; Fax: [phone]

## 2019-06-21 NOTE — Telephone Encounter (Signed)
Cardioversion scheduled on 06/24/19 at 1:30pm.Called pt to review Cardioversion instructions. Notified pt that instructions would also be sent through [place]. Covid test scheduled for 06/22/19 at 11:25am and pt made aware. Pt also aware to have pre procedure labs drawn the day of her cardioversion and to arrive 1.5 hours early to have this done.  Pt very thankful for all of the help and verbalized her understanding.  Also notified that Suanne Marker would like her to have an ECHO following the cardioversion. Pt verbalized that she had had one of these before and she understands. Will message schedulers to get pt on the schedule.

## 2019-06-21 NOTE — Patient Instructions (Signed)
Testing/Procedures: Wait for further instructions on cardioversion   Follow-Up: At [facility], you and your health needs are our priority.  As part of our continuing mission to provide you with exceptional heart care, we have created designated Provider Care Teams.  These Care Teams include your primary Cardiologist (physician) and Advanced Practice Providers (APPs -  Physician Assistants and Nurse Practitioners) who all work together to provide you with the care you need, when you need it.  We recommend signing up for the patient portal called "MyChart".  Sign up information is provided on this After Visit Summary.  MyChart is used to connect with patients for Virtual Visits (Telemedicine).  Patients are able to view lab/test results, encounter notes, upcoming appointments, etc.  Non-urgent messages can be sent to your provider as well.   To learn more about what you can do with MyChart, go to [email or web address].    Your next appointment:   Next available appt with Dr.C  Other Instructions Will be in contact for Cardioversion instructions

## 2019-06-22 ENCOUNTER — Other Ambulatory Visit (HOSPITAL_COMMUNITY)
Admission: RE | Admit: 2019-06-22 | Discharge: 2019-06-22 | Disposition: A | Discharge status: Home / Self Care | Payer: PPO | Source: Ambulatory Visit | Attending: Cardiovascular Disease | Admitting: Cardiovascular Disease

## 2019-06-22 DIAGNOSIS — Z01812 Encounter for preprocedural laboratory examination: Secondary | ICD-10-CM | POA: Diagnosis not present

## 2019-06-22 DIAGNOSIS — Z20822 Contact with and (suspected) exposure to covid-19: Secondary | ICD-10-CM | POA: Insufficient documentation

## 2019-06-22 LAB — SARS CORONAVIRUS 2 (TAT 6-24 HRS): SARS Coronavirus 2: NEGATIVE

## 2019-06-24 ENCOUNTER — Encounter (HOSPITAL_COMMUNITY): Payer: Self-pay | Admitting: Cardiovascular Disease

## 2019-06-24 ENCOUNTER — Ambulatory Visit (HOSPITAL_COMMUNITY): Payer: PPO | Admitting: Anesthesiology

## 2019-06-24 ENCOUNTER — Encounter (HOSPITAL_COMMUNITY)
Admission: RE | Disposition: A | Discharge status: Home / Self Care | Payer: Self-pay | Source: Home / Self Care | Attending: Cardiovascular Disease

## 2019-06-24 ENCOUNTER — Other Ambulatory Visit: Payer: Self-pay

## 2019-06-24 ENCOUNTER — Ambulatory Visit (HOSPITAL_COMMUNITY)
Admission: RE | Admit: 2019-06-24 | Discharge: 2019-06-24 | Disposition: A | Discharge status: Home / Self Care | Payer: PPO | Attending: Cardiovascular Disease | Admitting: Cardiovascular Disease

## 2019-06-24 DIAGNOSIS — G4733 Obstructive sleep apnea (adult) (pediatric): Secondary | ICD-10-CM | POA: Diagnosis not present

## 2019-06-24 DIAGNOSIS — Z87891 Personal history of nicotine dependence: Secondary | ICD-10-CM | POA: Insufficient documentation

## 2019-06-24 DIAGNOSIS — I4819 Other persistent atrial fibrillation: Secondary | ICD-10-CM | POA: Diagnosis not present

## 2019-06-24 DIAGNOSIS — K219 Gastro-esophageal reflux disease without esophagitis: Secondary | ICD-10-CM | POA: Insufficient documentation

## 2019-06-24 DIAGNOSIS — I129 Hypertensive chronic kidney disease with stage 1 through stage 4 chronic kidney disease, or unspecified chronic kidney disease: Secondary | ICD-10-CM | POA: Insufficient documentation

## 2019-06-24 DIAGNOSIS — J449 Chronic obstructive pulmonary disease, unspecified: Secondary | ICD-10-CM | POA: Insufficient documentation

## 2019-06-24 DIAGNOSIS — I2510 Atherosclerotic heart disease of native coronary artery without angina pectoris: Secondary | ICD-10-CM | POA: Insufficient documentation

## 2019-06-24 DIAGNOSIS — Z79899 Other long term (current) drug therapy: Secondary | ICD-10-CM | POA: Insufficient documentation

## 2019-06-24 DIAGNOSIS — Z881 Allergy status to other antibiotic agents status: Secondary | ICD-10-CM | POA: Diagnosis not present

## 2019-06-24 DIAGNOSIS — Z7951 Long term (current) use of inhaled steroids: Secondary | ICD-10-CM | POA: Diagnosis not present

## 2019-06-24 DIAGNOSIS — N183 Chronic kidney disease, stage 3 unspecified: Secondary | ICD-10-CM | POA: Diagnosis not present

## 2019-06-24 DIAGNOSIS — E039 Hypothyroidism, unspecified: Secondary | ICD-10-CM | POA: Insufficient documentation

## 2019-06-24 DIAGNOSIS — I4891 Unspecified atrial fibrillation: Secondary | ICD-10-CM | POA: Diagnosis not present

## 2019-06-24 DIAGNOSIS — E785 Hyperlipidemia, unspecified: Secondary | ICD-10-CM | POA: Insufficient documentation

## 2019-06-24 DIAGNOSIS — Z880 Allergy status to penicillin: Secondary | ICD-10-CM | POA: Diagnosis not present

## 2019-06-24 DIAGNOSIS — E559 Vitamin D deficiency, unspecified: Secondary | ICD-10-CM | POA: Diagnosis not present

## 2019-06-24 DIAGNOSIS — Z7901 Long term (current) use of anticoagulants: Secondary | ICD-10-CM | POA: Insufficient documentation

## 2019-06-24 DIAGNOSIS — G4730 Sleep apnea, unspecified: Secondary | ICD-10-CM | POA: Diagnosis not present

## 2019-06-24 DIAGNOSIS — Z885 Allergy status to narcotic agent status: Secondary | ICD-10-CM | POA: Insufficient documentation

## 2019-06-24 DIAGNOSIS — I340 Nonrheumatic mitral (valve) insufficiency: Secondary | ICD-10-CM | POA: Insufficient documentation

## 2019-06-24 DIAGNOSIS — Z6841 Body Mass Index (BMI) 40.0 and over, adult: Secondary | ICD-10-CM | POA: Insufficient documentation

## 2019-06-24 DIAGNOSIS — Z79890 Hormone replacement therapy (postmenopausal): Secondary | ICD-10-CM | POA: Diagnosis not present

## 2019-06-24 DIAGNOSIS — Z882 Allergy status to sulfonamides status: Secondary | ICD-10-CM | POA: Insufficient documentation

## 2019-06-24 DIAGNOSIS — E669 Obesity, unspecified: Secondary | ICD-10-CM | POA: Diagnosis not present

## 2019-06-24 HISTORY — PX: CARDIOVERSION: SHX1299

## 2019-06-24 LAB — POCT I-STAT, CHEM 8
BUN: 19 mg/dL (ref 8–23)
Calcium, Ion: 0.93 mmol/L — ABNORMAL LOW (ref 1.15–1.40)
Chloride: 108 mmol/L (ref 98–111)
Creatinine, Ser: 1.1 mg/dL — ABNORMAL HIGH (ref 0.44–1.00)
Glucose, Bld: 105 mg/dL — ABNORMAL HIGH (ref 70–99)
HCT: 38 % (ref 36.0–46.0)
Hemoglobin: 12.9 g/dL (ref 12.0–15.0)
Potassium: 4.4 mmol/L (ref 3.5–5.1)
Sodium: 139 mmol/L (ref 135–145)
TCO2: 23 mmol/L (ref 22–32)

## 2019-06-24 SURGERY — CARDIOVERSION
Anesthesia: General

## 2019-06-24 MED ORDER — LIDOCAINE 2% (20 MG/ML) 5 ML SYRINGE
INTRAMUSCULAR | Status: DC | PRN
  Administered 2019-06-24: 60 mg via INTRAVENOUS

## 2019-06-24 MED ORDER — SODIUM CHLORIDE 0.9 % IV SOLN: INTRAVENOUS | Status: DC | PRN

## 2019-06-24 MED ORDER — PROPOFOL 10 MG/ML IV BOLUS
INTRAVENOUS | Status: DC | PRN
  Administered 2019-06-24: 80 mg via INTRAVENOUS
  Administered 2019-06-24: 20 mg via INTRAVENOUS

## 2019-06-24 NOTE — Interval H&P Note (Signed)
History and Physical Interval Note:  06/24/2019 1:02 PM  Leslie [name]  has presented today for surgery, with the diagnosis of afib.  The various methods of treatment have been discussed with the patient and family. After consideration of risks, benefits and other options for treatment, the patient has consented to  Procedure(s): CARDIOVERSION (N/A) as a surgical intervention.  The patient's history has been reviewed, patient examined, no change in status, stable for surgery.  I have reviewed the patient's chart and labs.  Questions were answered to the patient's satisfaction.     [name]

## 2019-06-24 NOTE — Anesthesia Procedure Notes (Signed)
Procedure Name: MAC Date/Time: 06/24/2019 1:58 PM Performed by: Imagene Riches, CRNA Pre-anesthesia Checklist: Patient identified, Suction available, Emergency Drugs available, Patient being monitored and Timeout performed Patient Re-evaluated:Patient Re-evaluated prior to induction Oxygen Delivery Method: Ambu bag

## 2019-06-24 NOTE — Op Note (Signed)
Procedure: Electrical Cardioversion Indications:  Atrial Fibrillation  Procedure Details:  Consent: Risks of procedure as well as the alternatives and risks of each were explained to the (patient/caregiver).  Consent for procedure obtained.  Time Out: Verified patient identification, verified procedure, site/side was marked, verified correct patient position, special equipment/implants available, medications/allergies/relevent history reviewed, required imaging and test results available.  Performed  Patient placed on cardiac monitor, pulse oximetry, supplemental oxygen as necessary.  Sedation given: propofol 100 mg IV, Dr. Tobias Alexander Pacer pads placed anterior and posterior chest.  Cardioverted 1 time(s).  Cardioversion with synchronized biphasic 200J shock.  Evaluation: Findings: Post procedure EKG shows: NSR Complications: None Patient did tolerate procedure well.  Time Spent Directly with the Patient:  30 minutes   Leslie [name] 06/24/2019, 2:10 PM

## 2019-06-24 NOTE — Transfer of Care (Signed)
Immediate Anesthesia Transfer of Care Note  Patient: Leslie [name]  Procedure(s) Performed: CARDIOVERSION (N/A )  Patient Location: Endoscopy Unit  Anesthesia Type:General  Level of Consciousness: awake, alert  and oriented  Airway & Oxygen Therapy: Patient Spontanous Breathing  Post-op Assessment: Report given to RN and Post -op Vital signs reviewed and stable  Post vital signs: Reviewed and stable  Last Vitals:  Vitals Value Taken Time  BP    Temp    Pulse    Resp    SpO2      Last Pain:  Vitals:   06/24/19 1300  TempSrc: Tympanic  PainSc: 0-No pain         Complications: No apparent anesthesia complications

## 2019-06-24 NOTE — Anesthesia Postprocedure Evaluation (Signed)
Anesthesia Post Note  Patient: Leslie [name]  Procedure(s) Performed: CARDIOVERSION (N/A )     Patient location during evaluation: Endoscopy Anesthesia Type: General Level of consciousness: awake and alert Pain management: pain level controlled Vital Signs Assessment: post-procedure vital signs reviewed and stable Respiratory status: spontaneous breathing, nonlabored ventilation, respiratory function stable and patient connected to nasal cannula oxygen Cardiovascular status: blood pressure returned to baseline and stable Postop Assessment: no apparent nausea or vomiting Anesthetic complications: no    Last Vitals:  Vitals:   06/24/19 1420 06/24/19 1425  BP: 140/74 (!) 141/85  Pulse: 77 78  Resp: 16 18  Temp:    SpO2: 99% 100%    Last Pain:  Vitals:   06/24/19 1410  TempSrc: Temporal  PainSc: 0-No pain                 Jerriyah Louis DANIEL

## 2019-06-24 NOTE — Anesthesia Preprocedure Evaluation (Signed)
Anesthesia Evaluation    Reviewed: Allergy & Precautions, NPO status , reviewed documented beta blocker date and time   History of Anesthesia Complications (+) PONV and history of anesthetic complications  Airway Mallampati: II  TM Distance: >3 FB Neck ROM: Full    Dental no notable dental hx. (+) Teeth Intact, Dental Advisory Given   Pulmonary asthma , sleep apnea , former smoker,    Pulmonary exam normal        Cardiovascular hypertension, Pt. on medications and Pt. on home beta blockers Normal cardiovascular exam+ dysrhythmias Atrial Fibrillation + Valvular Problems/Murmurs AI and MR  Rhythm:Irregular Rate:Normal + Systolic murmurs  [id number] Echo: EF 55-60%, mod AI;  b. 07/2016 TEE: EF 50-55%, mod AI.   Neuro/Psych  Headaches, negative psych ROS   GI/Hepatic hiatal hernia, GERD  ,  Endo/Other  Hypothyroidism Morbid obesity  Renal/GU Renal InsufficiencyRenal disease  negative genitourinary   Musculoskeletal  (+) Arthritis ,   Abdominal (+) + obese,   Peds  Hematology   Anesthesia Other Findings   Reproductive/Obstetrics                             Anesthesia Physical  Anesthesia Plan  ASA: III  Anesthesia Plan: General   Post-op Pain Management:    Induction: Intravenous  PONV Risk Score and Plan: Treatment may vary due to age or medical condition  Airway Management Planned: Mask  Additional Equipment:   Intra-op Plan:   Post-operative Plan:   Informed Consent:   Plan Discussed with: CRNA  Anesthesia Plan Comments:         Anesthesia Quick Evaluation

## 2019-06-24 NOTE — Discharge Instructions (Signed)
Electrical Cardioversion Electrical cardioversion is the delivery of a jolt of electricity to restore a normal rhythm to the heart. A rhythm that is too fast or is not regular keeps the heart from pumping well. In this procedure, sticky patches or metal paddles are placed on the chest to deliver electricity to the heart from a device. This procedure may be done in an emergency if:  There is low or no blood pressure as a result of the heart rhythm.  Normal rhythm must be restored as fast as possible to protect the brain and heart from further damage.  It may save a life. This may also be a scheduled procedure for irregular or fast heart rhythms that are not immediately life-threatening. Tell a health care provider about:  Any allergies you have.  All medicines you are taking, including vitamins, herbs, eye drops, creams, and over-the-counter medicines.  Any problems you or family members have had with anesthetic medicines.  Any blood disorders you have.  Any surgeries you have had.  Any medical conditions you have.  Whether you are pregnant or may be pregnant. What are the risks? Generally, this is a safe procedure. However, problems may occur, including:  Allergic reactions to medicines.  A blood clot that breaks free and travels to other parts of your body.  The possible return of an abnormal heart rhythm within hours or days after the procedure.  Your heart stopping (cardiac arrest). This is rare. What happens before the procedure? Medicines  Your health care provider may have you start taking: ? Blood-thinning medicines (anticoagulants) so your blood does not clot as easily. ? Medicines to help stabilize your heart rate and rhythm.  Ask your health care provider about: ? Changing or stopping your regular medicines. This is especially important if you are taking diabetes medicines or blood thinners. ? Taking medicines such as aspirin and ibuprofen. These medicines can  thin your blood. Do not take these medicines unless your health care provider tells you to take them. ? Taking over-the-counter medicines, vitamins, herbs, and supplements. General instructions  Follow instructions from your health care provider about eating or drinking restrictions.  Plan to have someone take you home from the hospital or clinic.  If you will be going home right after the procedure, plan to have someone with you for 24 hours.  Ask your health care provider what steps will be taken to help prevent infection. These may include washing your skin with a germ-killing soap. What happens during the procedure?   An IV will be inserted into one of your veins.  Sticky patches (electrodes) or metal paddles may be placed on your chest.  You will be given a medicine to help you relax (sedative).  An electrical shock will be delivered. The procedure may vary among health care providers and hospitals. What can I expect after the procedure?  Your blood pressure, heart rate, breathing rate, and blood oxygen level will be monitored until you leave the hospital or clinic.  Your heart rhythm will be watched to make sure it does not change.  You may have some redness on the skin where the shocks were given. Follow these instructions at home:  Do not drive for 24 hours if you were given a sedative during your procedure.  Take over-the-counter and prescription medicines only as told by your health care provider.  Ask your health care provider how to check your pulse. Check it often.  Rest for 48 hours after the procedure or   as told by your health care provider.  Avoid or limit your caffeine use as told by your health care provider.  Keep all follow-up visits as told by your health care provider. This is important. Contact a health care provider if:  You feel like your heart is beating too quickly or your pulse is not regular.  You have a serious muscle cramp that does not go  away. Get help right away if:  You have discomfort in your chest.  You are dizzy or you feel faint.  You have trouble breathing or you are short of breath.  Your speech is slurred.  You have trouble moving an arm or leg on one side of your body.  Your fingers or toes turn cold or blue. Summary  Electrical cardioversion is the delivery of a jolt of electricity to restore a normal rhythm to the heart.  This procedure may be done right away in an emergency or may be a scheduled procedure if the condition is not an emergency.  Generally, this is a safe procedure.  After the procedure, check your pulse often as told by your health care provider. This information is not intended to replace advice given to you by your health care provider. Make sure you discuss any questions you have with your health care provider. Document Revised: 11/02/2018 Document Reviewed: 11/02/2018 Elsevier Patient Education  2020 [employer]  

## 2019-06-25 ENCOUNTER — Encounter: Payer: Self-pay | Admitting: *Deleted

## 2019-07-08 ENCOUNTER — Ambulatory Visit (HOSPITAL_COMMUNITY): Payer: PPO | Attending: Cardiology

## 2019-07-08 ENCOUNTER — Other Ambulatory Visit: Payer: Self-pay

## 2019-07-08 DIAGNOSIS — I4819 Other persistent atrial fibrillation: Secondary | ICD-10-CM

## 2019-07-28 ENCOUNTER — Ambulatory Visit: Payer: PPO | Admitting: Cardiovascular Disease

## 2019-07-28 ENCOUNTER — Encounter: Payer: Self-pay | Admitting: Cardiovascular Disease

## 2019-07-28 ENCOUNTER — Other Ambulatory Visit: Payer: Self-pay

## 2019-07-28 VITALS — BP 115/69 | HR 58 | Temp 97.0°F | Ht 66.0 in | Wt 282.0 lb

## 2019-07-28 DIAGNOSIS — I351 Nonrheumatic aortic (valve) insufficiency: Secondary | ICD-10-CM

## 2019-07-28 DIAGNOSIS — I10 Essential (primary) hypertension: Secondary | ICD-10-CM

## 2019-07-28 DIAGNOSIS — I340 Nonrheumatic mitral (valve) insufficiency: Secondary | ICD-10-CM

## 2019-07-28 DIAGNOSIS — Z7901 Long term (current) use of anticoagulants: Secondary | ICD-10-CM

## 2019-07-28 DIAGNOSIS — I5189 Other ill-defined heart diseases: Secondary | ICD-10-CM | POA: Diagnosis not present

## 2019-07-28 DIAGNOSIS — I4819 Other persistent atrial fibrillation: Secondary | ICD-10-CM | POA: Diagnosis not present

## 2019-07-28 DIAGNOSIS — G4733 Obstructive sleep apnea (adult) (pediatric): Secondary | ICD-10-CM

## 2019-07-28 MED ORDER — METOPROLOL SUCCINATE ER 100 MG PO TB24: ORAL_TABLET | ORAL | 3 refills | Status: DC

## 2019-07-28 NOTE — Patient Instructions (Addendum)
Medication Instructions:  CHANGE: How you take the Metoprolol to 100 mg in the morning and 50 mg in the evening  *If you need a refill on your cardiac medications before your next appointment, please call your pharmacy*   Lab Work: None ordered If you have labs (blood work) drawn today and your tests are completely normal, you will receive your results only by: Marland Kitchen MyChart Message (if you have MyChart) OR . A paper copy in the mail If you have any lab test that is abnormal or we need to change your treatment, we will call you to review the results.   Testing/Procedures: None ordered   Follow-Up: At [facility], you and your health needs are our priority.  As part of our continuing mission to provide you with exceptional heart care, we have created designated Provider Care Teams.  These Care Teams include your primary Cardiologist (physician) and Advanced Practice Providers (APPs -  Physician Assistants and Nurse Practitioners) who all work together to provide you with the care you need, when you need it.  We recommend signing up for the patient portal called "MyChart".  Sign up information is provided on this After Visit Summary.  MyChart is used to connect with patients for Virtual Visits (Telemedicine).  Patients are able to view lab/test results, encounter notes, upcoming appointments, etc.  Non-urgent messages can be sent to your provider as well.   To learn more about what you can do with MyChart, go to [email or web address].    Your next appointment:   12 month(s)  The format for your next appointment:   In Person  Provider:   You may see Sanda Klein, MD or one of the following Advanced Practice Providers on your designated Care Team:    Almyra Deforest, PA-C  Fabian Sharp, PA-C or   Roby Lofts, [place]

## 2019-07-28 NOTE — Progress Notes (Signed)
Cardiology Office Note:    Date:  07/28/2019   ID:  Leslie, [name] 01-Apr-1946, MRN [id number]  PCP:  Glenis Smoker, MD  Cardiologist:  Sanda Klein, MD    Referring MD: Glenis Smoker, *   No chief complaint on file.   History of Present Illness:    Leslie [name] is a 74 y.o. female with a hx of Persistent atrial fibrillation returning for follow-up.   Initially presented with persistent atrial fibrillation in April 2018, had a difficult time with rate control and underwent TEE guided cardioversion.  Beta-blocker dose was decreased in May 2018 when she had junctional rhythm.  After she received her second dose of coronavirus vaccine in February 2021 she developed symptomatic persistent atrial fibrillation with RVR, again challenging to rate control and underwent cardioversion on June 24, 2019.  It took her about 5 days to feel symptomatic improvement but she is now back to her baseline.  She has had a few very brief palpitations and these are less and less in frequency.  She feels that she is "dragging" a little bit on the higher dose of beta-blocker  Additional problems include morbid obesity, obstructive sleep apnea on CPAP and treated hypertension. Echocardiography showed moderate aortic insufficiency and mild mitral regurgitation, neither one of which is felt to be the cause of her arrhythmia.  A follow-up echocardiogram performed July 08, 2019 again shows moderate aortic insufficiency and mild mitral regurgitation.  The left atrium is moderately dilated.  LVEF is normal.  "Pseudonormal" mitral inflow was described, but this was performed relatively soon after her cardioversion.  He is compliant with anticoagulation therapy and has not had any falls, injuries or bleeding problems.  She has mild chronic swelling in her left ankle but no other evidence of fluid overload.  She denies orthopnea, PND, dizziness or syncope or focal neurological complaints.  Her blood  pressure is well controlled.  Her recent labs show mild hypertriglyceridemia and low HDL, but an acceptable LDL level.  Past Medical History:  Diagnosis Date  . Adenomatous polyp   . Arthritis    "knees, legs, fingers" (07/30/2016)  . Asthma   . Bursitis of left shoulder    "just finished PT" (07/30/2016)  . Chest pain    a. 2003 Abnl stress test-->Cath: nonobs CAD.  Marland Kitchen Chronic bronchitis ([place])   . Chronic kidney disease    stage 3  . GERD (gastroesophageal reflux disease)    barrett's esophagus- Dr. Cristina Gong  . History of hiatal hernia   . Hyperlipidemia   . Hypertension   . Hypothyroidism   . Migraines    "sporatic; at least a few/year" (07/30/2016)  . Mitral regurgitation    a. 07/2016 Echo: mild to mod MR;  b. 07/2016 TEE: mild MR.  . Moderate aortic insufficiency    a. 07/2016 Echo: EF 55-60%, mod AI;  b. 07/2016 TEE: EF 50-55%, mod AI.  Marland Kitchen Obesity    s/p lap band surgery 6/10  . OSA on CPAP   . PAF (paroxysmal atrial fibrillation) ([place])    a. 07/2016 TEE/DCCV: EF 50-55%, mild MR, mod AI, mild to mod TR, neg bubble study-->successful DCCV x1 w/ 120J.  Marland Kitchen PONV (postoperative nausea and vomiting)   . Vitamin D deficiency     Past Surgical History:  Procedure Laterality Date  . APPENDECTOMY     incidental  . CARDIOVERSION N/A 08/01/2016   Procedure: CARDIOVERSION;  Surgeon: Jerline Pain, MD;  Location: [facility];  Service: Cardiovascular;  Laterality: N/A;  . CARDIOVERSION N/A 06/24/2019   Procedure: CARDIOVERSION;  Surgeon: Sanda Klein, MD;  Location: [facility];  Service: Cardiovascular;  Laterality: N/A;  . CATARACT EXTRACTION W/ INTRAOCULAR LENS  IMPLANT, BILATERAL Bilateral ~ 2008-2017   left - right  . COMBINED HYSTERECTOMY VAGINAL / OOPHORECTOMY / A&P REPAIR  1981  . HERNIA REPAIR    . INGUINAL HERNIA REPAIR Left 1952  . LAPAROSCOPIC GASTRIC BANDING WITH HIATAL HERNIA REPAIR  09/2008  . TEE WITHOUT CARDIOVERSION N/A 08/01/2016   Procedure: TRANSESOPHAGEAL  ECHOCARDIOGRAM (TEE);  Surgeon: Jerline Pain, MD;  Location: [facility];  Service: Cardiovascular;  Laterality: N/A;  . [place]  . VAGINAL HYSTERECTOMY  1981   "w/1 ovary"    Current Medications: Current Meds  Medication Sig  . acetaminophen (TYLENOL) 650 MG CR tablet Take 1,300 mg by mouth every 8 (eight) hours as needed for pain.  Marland Kitchen apixaban (ELIQUIS) 5 MG TABS tablet Take 1 tablet (5 mg total) by mouth 2 (two) times daily.  Marland Kitchen atorvastatin (LIPITOR) 10 MG tablet Take 10 mg by mouth daily.  . Azelaic Acid (FINACEA EX) Apply 1 application topically daily.  . Bacillus Coagulans-Inulin (ALIGN PREBIOTIC-PROBIOTIC PO) Take 2 capsules by mouth daily.  . cholecalciferol (VITAMIN D) 1000 UNITS tablet Take 1,000 Units by mouth daily.  . diclofenac Sodium (VOLTAREN) 1 % GEL Apply 1 application topically 2 (two) times daily.  . fexofenadine (ALLEGRA) 180 MG tablet Take 180 mg by mouth daily.  . fluticasone (FLONASE) 50 MCG/ACT nasal spray Place 2 sprays into both nostrils at bedtime.   . fluticasone (FLOVENT HFA) 44 MCG/ACT inhaler Inhale 2 puffs into the lungs 2 (two) times daily. (Patient taking differently: Inhale 2 puffs into the lungs at bedtime. )  . furosemide (LASIX) 20 MG tablet Take 20 mg by mouth every morning.  . Glucosamine-Chondroitin (OSTEO BI-FLEX REGULAR STRENGTH PO) Take 1 tablet by mouth daily.  . irbesartan (AVAPRO) 150 MG tablet TAKE 1 TABLET(150 MG) BY MOUTH DAILY (Patient taking differently: Take 150 mg by mouth daily. )  . levothyroxine (SYNTHROID, LEVOTHROID) 50 MCG tablet Take 50 mcg by mouth daily before breakfast.  . MAGNESIUM-OXIDE 400 (241.3 Mg) MG tablet TAKE 1 TABLET BY MOUTH DAILY (Patient taking differently: Take 400 mg by mouth daily. )  . Methylcellulose, Laxative, (CITRUCEL PO) Take 1 Dose by mouth daily in the afternoon.  . metoprolol succinate (TOPROL-XL) 100 MG 24 hr tablet Take 100 mg (one tablet) in the morning and 50 mg (half  a tablet) in the evening.  . Multiple Vitamins-Minerals (CENTRUM SILVER ADULT 50+ PO) Take 1 tablet by mouth daily.   Marland Kitchen omeprazole (PRILOSEC) 20 MG capsule Take 40 mg by mouth daily before breakfast.  . verapamil (CALAN-SR) 180 MG CR tablet Take 180 mg by mouth 2 (two) times daily.  . [DISCONTINUED] metoprolol succinate (TOPROL-XL) 100 MG 24 hr tablet Take 1 tablet (100 mg total) by mouth 2 (two) times daily. Take with or immediately following a meal.     Allergies:   Augmentin [amoxicillin-pot clavulanate], Codeine, Demerol [meperidine], and Sulfa antibiotics   Social History   Socioeconomic History  . Marital status: Divorced    Spouse name: Not on file  . Number of children: Not on file  . Years of education: Not on file  . Highest education level: Not on file  Occupational History  . Not on file  Tobacco Use  . Smoking status: Former Research scientist (life sciences)  .  Smokeless tobacco: Never Used  . Tobacco comment: 07/30/2016 "social smoker only; in college"  Substance and Sexual Activity  . Alcohol use: Yes    Comment: 07/30/2016 "couple drinks/month"  . Drug use: No  . Sexual activity: Not Currently  Other Topics Concern  . Not on file  Social History Narrative  . Not on file   Social Determinants of Health   Financial Resource Strain:   . Difficulty of Paying Living Expenses:   Food Insecurity:   . Worried About Charity fundraiser in the Last Year:   . Arboriculturist in the Last Year:   Transportation Needs:   . Film/video editor (Medical):   Marland Kitchen Lack of Transportation (Non-Medical):   Physical Activity:   . Days of Exercise per Week:   . Minutes of Exercise per Session:   Stress:   . Feeling of Stress :   Social Connections:   . Frequency of Communication with Friends and Family:   . Frequency of Social Gatherings with Friends and Family:   . Attends Religious Services:   . Active Member of Clubs or Organizations:   . Attends Archivist Meetings:   Marland Kitchen Marital  Status:      Family History: The patient's family history includes Emphysema in her father; Heart failure in her mother; Hypertension in her father and mother. ROS:   Please see the history of present illness.    All other systems are reviewed and are negative.   EKGs/Labs/Other Studies Reviewed:     EKG: EKG is ordered today and shows mild sinus bradycardia, nonspecific ST-T changes.  Voltage is low due to body habitus.  QTc 422 ms.  Recent Labs: November 20, 2018 creatinine 1.28, potassium 4.6, normal TSH BMET    Component Value Date/Time   NA 139 06/24/2019 1319   K 4.4 06/24/2019 1319   CL 108 06/24/2019 1319   CO2 23 08/13/2016 1556   GLUCOSE 105 (H) 06/24/2019 1319   BUN 19 06/24/2019 1319   CREATININE 1.10 (H) 06/24/2019 1319   CREATININE 1.49 (H) 08/13/2016 1556   CALCIUM 8.6 08/13/2016 1556   GFRNONAA 35 (L) 08/13/2016 1556   GFRAA 41 (L) 08/13/2016 1556     Recent Lipid Panel November 20, 2018 total cholesterol 166, HDL 36, LDL 95, triglycerides 176     Component Value Date/Time   CHOL 146 07/31/2016 0435   TRIG 174 (H) 07/31/2016 0435   HDL 32 (L) 07/31/2016 0435   CHOLHDL 4.6 07/31/2016 0435   VLDL 35 07/31/2016 0435   LDLCALC 79 07/31/2016 0435    Physical Exam:    VS:  BP 115/69   Pulse (!) 58   Temp (!) 97 F (36.1 C)   Ht 5\' 6"  (1.676 m)   Wt 282 lb (127.9 kg)   BMI 45.52 kg/m     Wt Readings from Last 3 Encounters:  07/28/19 282 lb (127.9 kg)  06/21/19 281 lb 12.8 oz (127.8 kg)  01/06/19 277 lb 12.8 oz (126 kg)     General: Alert, oriented x3, no distress, morbidly obese Head: no evidence of trauma, PERRL, EOMI, no exophtalmos or lid lag, no myxedema, no xanthelasma; normal ears, nose and oropharynx Neck: normal jugular venous pulsations and no hepatojugular reflux; brisk carotid pulses without delay and no carotid bruits Chest: clear to auscultation, no signs of consolidation by percussion or palpation, normal fremitus, symmetrical and  full respiratory excursions Cardiovascular: normal position and quality of the apical  impulse, regular rhythm, normal first and second heart sounds, no murmurs, rubs or gallops Abdomen: no tenderness or distention, no masses by palpation, no abnormal pulsatility or arterial bruits, normal bowel sounds, no hepatosplenomegaly Extremities: no clubbing, cyanosis or edema; 2+ radial, ulnar and brachial pulses bilaterally; 2+ right femoral, posterior tibial and dorsalis pedis pulses; 2+ left femoral, posterior tibial and dorsalis pedis pulses; no subclavian or femoral bruits Neurological: grossly nonfocal Psych: Normal mood and affect   ASSESSMENT:    1. Persistent atrial fibrillation ([place])   2. Long term current use of anticoagulant   3. Essential hypertension, benign   4. Diastolic dysfunction   5. OSA (obstructive sleep apnea)   6. Morbid obesity ([place])   7. Nonrheumatic aortic valve insufficiency   8. Nonrheumatic mitral valve regurgitation    PLAN:    In order of problems listed above:  1. AFib: Symptomatic recurrence shortly after second coronavirus vaccine [employer]).  Unclear if this was causal or just temporally associated.  Maintaining sinus rhythm following cardioversion.  CHADSVasc 3 (age, gender, HTN).  Okay to interrupt anticoagulation if necessary at this point. 2. Anticoagulation: No bleeding complications. 3. HTN: Very well controlled.  Feels kind of sluggish on the increased dose of metoprolol.  Will decrease back to metoprolol 100 mg in the morning and 50 mg in the evening. 4. Diastolic dysfunction: May indeed be an accurate assessment, but confidence in the assessment is lower during the immediate post cardioversion.,  Since there may have been some degree of atrial mechanical stunning. 5. OSA: Continues to report 100% compliance, denies daytime hypersomnolence. 6. Obesity: Understands the relationship between weight and overall burden of atrial fibrillation. 7. MR/AR: There  is mild mitral regurgitation and moderate aortic insufficiency.  Neither 1 appears to be hemodynamically significant for clinically symptomatic.  Reevaluate in 3 years unless there are interval developments. 8. R carotid bruit: Radiating from chest.  Medication Adjustments/Labs and Tests Ordered: Current medicines are reviewed at length with the patient today.  Concerns regarding medicines are outlined above.  Orders Placed This Encounter  Procedures  . EKG 12-Lead   Meds ordered this encounter  Medications  . metoprolol succinate (TOPROL-XL) 100 MG 24 hr tablet    Sig: Take 100 mg (one tablet) in the morning and 50 mg (half a tablet) in the evening.    Dispense:  135 tablet    Refill:  3    Dose increase. Patient will contact the pharmacy when a refill is needed.   Patient Instructions  Medication Instructions:  CHANGE: How you take the Metoprolol to 100 mg in the morning and 50 mg in the evening  *If you need a refill on your cardiac medications before your next appointment, please call your pharmacy*   Lab Work: None ordered If you have labs (blood work) drawn today and your tests are completely normal, you will receive your results only by: Marland Kitchen MyChart Message (if you have MyChart) OR . A paper copy in the mail If you have any lab test that is abnormal or we need to change your treatment, we will call you to review the results.   Testing/Procedures: None ordered   Follow-Up: At [facility], you and your health needs are our priority.  As part of our continuing mission to provide you with exceptional heart care, we have created designated Provider Care Teams.  These Care Teams include your primary Cardiologist (physician) and Advanced Practice Providers (APPs -  Physician Assistants and Nurse Practitioners) who all  work together to provide you with the care you need, when you need it.  We recommend signing up for the patient portal called "MyChart".  Sign up information is  provided on this After Visit Summary.  MyChart is used to connect with patients for Virtual Visits (Telemedicine).  Patients are able to view lab/test results, encounter notes, upcoming appointments, etc.  Non-urgent messages can be sent to your provider as well.   To learn more about what you can do with MyChart, go to [email or web address].    Your next appointment:   12 month(s)  The format for your next appointment:   In Person  Provider:   You may see Sanda Klein, MD or one of the following Advanced Practice Providers on your designated Care Team:    Almyra Deforest, PA-C  Fabian Sharp, [place] or   Roby Lofts, PA-C      Signed, Sanda Klein, MD  07/28/2019 3:58 PM    [place]

## 2019-08-11 DIAGNOSIS — G4733 Obstructive sleep apnea (adult) (pediatric): Secondary | ICD-10-CM | POA: Diagnosis not present

## 2019-08-17 DIAGNOSIS — Z6841 Body Mass Index (BMI) 40.0 and over, adult: Secondary | ICD-10-CM | POA: Diagnosis not present

## 2019-08-17 DIAGNOSIS — N183 Chronic kidney disease, stage 3 unspecified: Secondary | ICD-10-CM | POA: Diagnosis not present

## 2019-08-17 DIAGNOSIS — J45909 Unspecified asthma, uncomplicated: Secondary | ICD-10-CM | POA: Diagnosis not present

## 2019-08-17 DIAGNOSIS — R7303 Prediabetes: Secondary | ICD-10-CM | POA: Diagnosis not present

## 2019-08-17 DIAGNOSIS — Z0000 Encounter for general adult medical examination without abnormal findings: Secondary | ICD-10-CM | POA: Diagnosis not present

## 2019-08-17 DIAGNOSIS — J301 Allergic rhinitis due to pollen: Secondary | ICD-10-CM | POA: Diagnosis not present

## 2019-08-17 DIAGNOSIS — E039 Hypothyroidism, unspecified: Secondary | ICD-10-CM | POA: Diagnosis not present

## 2019-08-17 DIAGNOSIS — I10 Essential (primary) hypertension: Secondary | ICD-10-CM | POA: Diagnosis not present

## 2019-08-24 DIAGNOSIS — K219 Gastro-esophageal reflux disease without esophagitis: Secondary | ICD-10-CM | POA: Diagnosis not present

## 2019-08-24 DIAGNOSIS — R198 Other specified symptoms and signs involving the digestive system and abdomen: Secondary | ICD-10-CM | POA: Diagnosis not present

## 2019-10-15 ENCOUNTER — Telehealth: Payer: Self-pay | Admitting: Cardiovascular Disease

## 2019-10-15 MED ORDER — APIXABAN 5 MG PO TABS: 5.0000 mg | ORAL_TABLET | Freq: Two times a day (BID) | ORAL | 1 refills | Status: DC

## 2019-10-15 NOTE — Telephone Encounter (Signed)
74 F 127.9 kg, SCr 1.1 (3/21), LOV [place] 4/21

## 2019-10-15 NOTE — Telephone Encounter (Signed)
*  STAT* If patient is at the pharmacy, call can be transferred to refill team.   1. Which medications need to be refilled? (please list name of each medication and dose if known) apixaban (ELIQUIS) 5 MG TABS tablet  2. Which pharmacy/location (including street and city if local pharmacy) is medication to be sent to? [facility] [place], [place] AT [address]  3. Do they need a 30 day or 90 day supply? 90 day  Patient is almost out of meds.  Please call patient went sent

## 2019-10-21 DIAGNOSIS — R198 Other specified symptoms and signs involving the digestive system and abdomen: Secondary | ICD-10-CM | POA: Diagnosis not present

## 2019-10-21 DIAGNOSIS — K219 Gastro-esophageal reflux disease without esophagitis: Secondary | ICD-10-CM | POA: Diagnosis not present

## 2019-11-01 ENCOUNTER — Ambulatory Visit
Admission: RE | Admit: 2019-11-01 | Discharge: 2019-11-01 | Disposition: A | Discharge status: Home / Self Care | Payer: PPO | Source: Ambulatory Visit | Attending: Gastroenterology | Admitting: Gastroenterology

## 2019-11-01 ENCOUNTER — Other Ambulatory Visit: Payer: Self-pay | Admitting: Gastroenterology

## 2019-11-01 DIAGNOSIS — R198 Other specified symptoms and signs involving the digestive system and abdomen: Secondary | ICD-10-CM

## 2019-11-01 DIAGNOSIS — R194 Change in bowel habit: Secondary | ICD-10-CM | POA: Diagnosis not present

## 2019-11-03 ENCOUNTER — Ambulatory Visit
Admission: RE | Admit: 2019-11-03 | Discharge: 2019-11-03 | Disposition: A | Discharge status: Home / Self Care | Payer: PPO | Source: Ambulatory Visit | Attending: Gastroenterology | Admitting: Gastroenterology

## 2019-11-03 ENCOUNTER — Other Ambulatory Visit: Payer: Self-pay | Admitting: Gastroenterology

## 2019-11-03 DIAGNOSIS — R198 Other specified symptoms and signs involving the digestive system and abdomen: Secondary | ICD-10-CM

## 2019-11-03 DIAGNOSIS — R194 Change in bowel habit: Secondary | ICD-10-CM | POA: Diagnosis not present

## 2019-11-05 ENCOUNTER — Other Ambulatory Visit: Payer: Self-pay | Admitting: Gastroenterology

## 2019-11-05 ENCOUNTER — Ambulatory Visit
Admission: RE | Admit: 2019-11-05 | Discharge: 2019-11-05 | Disposition: A | Discharge status: Home / Self Care | Payer: PPO | Source: Ambulatory Visit | Attending: Gastroenterology | Admitting: Gastroenterology

## 2019-11-05 DIAGNOSIS — R194 Change in bowel habit: Secondary | ICD-10-CM | POA: Diagnosis not present

## 2019-11-05 DIAGNOSIS — R198 Other specified symptoms and signs involving the digestive system and abdomen: Secondary | ICD-10-CM

## 2019-11-12 DIAGNOSIS — G4733 Obstructive sleep apnea (adult) (pediatric): Secondary | ICD-10-CM | POA: Diagnosis not present

## 2019-12-02 ENCOUNTER — Other Ambulatory Visit: Payer: Self-pay | Admitting: Family Medicine

## 2019-12-02 DIAGNOSIS — Z1231 Encounter for screening mammogram for malignant neoplasm of breast: Secondary | ICD-10-CM

## 2019-12-23 DIAGNOSIS — R198 Other specified symptoms and signs involving the digestive system and abdomen: Secondary | ICD-10-CM | POA: Diagnosis not present

## 2019-12-28 DIAGNOSIS — Z23 Encounter for immunization: Secondary | ICD-10-CM | POA: Diagnosis not present

## 2020-01-05 ENCOUNTER — Ambulatory Visit
Admission: RE | Admit: 2020-01-05 | Discharge: 2020-01-05 | Disposition: A | Discharge status: Home / Self Care | Payer: PPO | Source: Ambulatory Visit | Attending: Family Medicine | Admitting: Family Medicine

## 2020-01-05 ENCOUNTER — Other Ambulatory Visit: Payer: Self-pay

## 2020-01-05 DIAGNOSIS — Z1231 Encounter for screening mammogram for malignant neoplasm of breast: Secondary | ICD-10-CM | POA: Diagnosis not present

## 2020-01-09 ENCOUNTER — Other Ambulatory Visit: Payer: Self-pay | Admitting: Cardiovascular Disease

## 2020-01-11 ENCOUNTER — Encounter: Payer: Self-pay | Admitting: Cardiovascular Disease

## 2020-01-11 ENCOUNTER — Other Ambulatory Visit: Payer: Self-pay

## 2020-01-11 ENCOUNTER — Ambulatory Visit: Payer: PPO | Admitting: Cardiovascular Disease

## 2020-01-11 VITALS — BP 133/71 | HR 67 | Ht 66.0 in | Wt 279.8 lb

## 2020-01-11 DIAGNOSIS — I10 Essential (primary) hypertension: Secondary | ICD-10-CM | POA: Diagnosis not present

## 2020-01-11 DIAGNOSIS — I340 Nonrheumatic mitral (valve) insufficiency: Secondary | ICD-10-CM | POA: Diagnosis not present

## 2020-01-11 DIAGNOSIS — Z7901 Long term (current) use of anticoagulants: Secondary | ICD-10-CM

## 2020-01-11 DIAGNOSIS — G4733 Obstructive sleep apnea (adult) (pediatric): Secondary | ICD-10-CM

## 2020-01-11 DIAGNOSIS — I4819 Other persistent atrial fibrillation: Secondary | ICD-10-CM

## 2020-01-11 DIAGNOSIS — I5189 Other ill-defined heart diseases: Secondary | ICD-10-CM

## 2020-01-11 DIAGNOSIS — I351 Nonrheumatic aortic (valve) insufficiency: Secondary | ICD-10-CM | POA: Diagnosis not present

## 2020-01-11 NOTE — Patient Instructions (Signed)
Medication Instructions:  TAKE 100 mg of the Metoprolol Tartrate twice daily for the next three days only then resume 100 mg in the morning and then 50 mg in the evening.  *If you need a refill on your cardiac medications before your next appointment, please call your pharmacy*   Lab Work: None ordered If you have labs (blood work) drawn today and your tests are completely normal, you will receive your results only by: Marland Kitchen MyChart Message (if you have MyChart) OR . A paper copy in the mail If you have any lab test that is abnormal or we need to change your treatment, we will call you to review the results.   Testing/Procedures: None ordered   Follow-Up: At [facility], you and your health needs are our priority.  As part of our continuing mission to provide you with exceptional heart care, we have created designated Provider Care Teams.  These Care Teams include your primary Cardiologist (physician) and Advanced Practice Providers (APPs -  Physician Assistants and Nurse Practitioners) who all work together to provide you with the care you need, when you need it.  We recommend signing up for the patient portal called "MyChart".  Sign up information is provided on this After Visit Summary.  MyChart is used to connect with patients for Virtual Visits (Telemedicine).  Patients are able to view lab/test results, encounter notes, upcoming appointments, etc.  Non-urgent messages can be sent to your provider as well.   To learn more about what you can do with MyChart, go to [email or web address].    Your next appointment:   12 month(s)  The format for your next appointment:   In Person  Provider:   You may see Sanda Klein, MD or one of the following Advanced Practice Providers on your designated Care Team:    Almyra Deforest, PA-C  Fabian Sharp, PA-C or   Roby Lofts, [place]

## 2020-01-11 NOTE — Progress Notes (Signed)
Cardiology Office Note:    Date:  01/16/2020   ID:  YVETTA DROTAR, DOB [birth year], MRN [id number]  PCP:  Glenis Smoker, MD  Cardiologist:  Sanda Klein, MD    Referring MD: Glenis Smoker, *   Chief Complaint  Patient presents with  . Atrial Fibrillation    History of Present Illness:    Leslie [name] is a 74 y.o. female with a hx of Persistent atrial fibrillation returning for follow-up.   Initially presented with persistent atrial fibrillation in April 2018, had a difficult time with rate control and underwent TEE guided cardioversion.  Beta-blocker dose was decreased in May 2018 when she had junctional rhythm.  After she received her second dose of coronavirus vaccine in February 2021 she developed symptomatic persistent atrial fibrillation with RVR, again challenging to rate control and underwent cardioversion on June 24, 2019.   She has not had any symptoms of atrial fibrillation since then, but is concerned that it might recur with the booster shot of her coronavirus vaccine.  She generally feels quite well and does not have specific cardiovascular complaints. The patient specifically denies any chest pain at rest exertion, dyspnea at rest or with exertion, orthopnea, paroxysmal nocturnal dyspnea, syncope, palpitations, focal neurological deficits, intermittent claudication, lower extremity edema, unexplained weight gain, cough, hemoptysis or wheezing.  She has had not had falls or bleeding problems.  Additional problems include morbid obesity, obstructive sleep apnea on CPAP and treated hypertension. Echocardiography showed moderate aortic insufficiency and mild mitral regurgitation, neither one of which is felt to be the cause of her arrhythmia.  A follow-up echocardiogram performed July 08, 2019 again shows moderate aortic insufficiency and mild mitral regurgitation.  The left atrium is moderately dilated.  LVEF is normal.  "Pseudonormal" mitral inflow was  described, but this was performed relatively soon after her cardioversion.   Past Medical History:  Diagnosis Date  . Adenomatous polyp   . Arthritis    "knees, legs, fingers" (07/30/2016)  . Asthma   . Bursitis of left shoulder    "just finished PT" (07/30/2016)  . Chest pain    a. 2003 Abnl stress test-->Cath: nonobs CAD.  Marland Kitchen Chronic bronchitis ([place])   . Chronic kidney disease    stage 3  . GERD (gastroesophageal reflux disease)    barrett's esophagus- Dr. Cristina Gong  . History of hiatal hernia   . Hyperlipidemia   . Hypertension   . Hypothyroidism   . Migraines    "sporatic; at least a few/year" (07/30/2016)  . Mitral regurgitation    a. 07/2016 Echo: mild to mod MR;  b. 07/2016 TEE: mild MR.  . Moderate aortic insufficiency    a. 07/2016 Echo: EF 55-60%, mod AI;  b. 07/2016 TEE: EF 50-55%, mod AI.  Marland Kitchen Obesity    s/p lap band surgery 6/10  . OSA on CPAP   . PAF (paroxysmal atrial fibrillation) ([place])    a. 07/2016 TEE/DCCV: EF 50-55%, mild MR, mod AI, mild to mod TR, neg bubble study-->successful DCCV x1 w/ 120J.  Marland Kitchen PONV (postoperative nausea and vomiting)   . Vitamin D deficiency     Past Surgical History:  Procedure Laterality Date  . APPENDECTOMY     incidental  . CARDIOVERSION N/A 08/01/2016   Procedure: CARDIOVERSION;  Surgeon: Jerline Pain, MD;  Location: [facility];  Service: Cardiovascular;  Laterality: N/A;  . CARDIOVERSION N/A 06/24/2019   Procedure: CARDIOVERSION;  Surgeon: Sanda Klein, MD;  Location: [facility];  Service:  Cardiovascular;  Laterality: N/A;  . CATARACT EXTRACTION W/ INTRAOCULAR LENS  IMPLANT, BILATERAL Bilateral ~ 2008-2017   left - right  . COMBINED HYSTERECTOMY VAGINAL / OOPHORECTOMY / A&P REPAIR  1981  . HERNIA REPAIR    . INGUINAL HERNIA REPAIR Left 1952  . LAPAROSCOPIC GASTRIC BANDING WITH HIATAL HERNIA REPAIR  09/2008  . TEE WITHOUT CARDIOVERSION N/A 08/01/2016   Procedure: TRANSESOPHAGEAL ECHOCARDIOGRAM (TEE);  Surgeon: Jerline Pain,  MD;  Location: [facility];  Service: Cardiovascular;  Laterality: N/A;  . [place]  . VAGINAL HYSTERECTOMY  1981   "w/1 ovary"    Current Medications: Current Meds  Medication Sig  . acetaminophen (TYLENOL) 650 MG CR tablet Take 1,300 mg by mouth every 8 (eight) hours as needed for pain.  Marland Kitchen apixaban (ELIQUIS) 5 MG TABS tablet Take 1 tablet (5 mg total) by mouth 2 (two) times daily.  Marland Kitchen atorvastatin (LIPITOR) 10 MG tablet Take 10 mg by mouth daily.  . Azelaic Acid (FINACEA EX) Apply 1 application topically daily.  . Bacillus Coagulans-Inulin (ALIGN PREBIOTIC-PROBIOTIC PO) Take 2 capsules by mouth daily.  . cholecalciferol (VITAMIN D) 1000 UNITS tablet Take 1,000 Units by mouth daily.  . diclofenac Sodium (VOLTAREN) 1 % GEL Apply 1 application topically 2 (two) times daily.  . fexofenadine (ALLEGRA) 180 MG tablet Take 180 mg by mouth daily.  . fluticasone (FLONASE) 50 MCG/ACT nasal spray Place 2 sprays into both nostrils at bedtime.   . fluticasone (FLOVENT HFA) 44 MCG/ACT inhaler Inhale 2 puffs into the lungs 2 (two) times daily. (Patient taking differently: Inhale 2 puffs into the lungs at bedtime. )  . furosemide (LASIX) 20 MG tablet Take 20 mg by mouth every morning.  . Glucosamine-Chondroitin (OSTEO BI-FLEX REGULAR STRENGTH PO) Take 1 tablet by mouth daily.  . irbesartan (AVAPRO) 150 MG tablet TAKE 1 TABLET(150 MG) BY MOUTH DAILY (Patient taking differently: Take 150 mg by mouth daily. )  . levothyroxine (SYNTHROID, LEVOTHROID) 50 MCG tablet Take 50 mcg by mouth daily before breakfast.  . MAGNESIUM-OXIDE 400 (241.3 Mg) MG tablet TAKE 1 TABLET BY MOUTH DAILY  . Methylcellulose, Laxative, (CITRUCEL PO) Take 1 Dose by mouth daily in the afternoon.  . metoprolol succinate (TOPROL-XL) 100 MG 24 hr tablet Take 100 mg (one tablet) in the morning and 50 mg (half a tablet) in the evening.  . Multiple Vitamins-Minerals (CENTRUM SILVER ADULT 50+ PO) Take 1 tablet by  mouth daily.   Marland Kitchen omeprazole (PRILOSEC) 20 MG capsule Take 40 mg by mouth daily before breakfast.  . verapamil (CALAN-SR) 180 MG CR tablet Take 180 mg by mouth 2 (two) times daily.     Allergies:   Augmentin [amoxicillin-pot clavulanate], Codeine, Demerol [meperidine], and Sulfa antibiotics   Social History   Socioeconomic History  . Marital status: Divorced    Spouse name: Not on file  . Number of children: Not on file  . Years of education: Not on file  . Highest education level: Not on file  Occupational History  . Not on file  Tobacco Use  . Smoking status: Former Research scientist (life sciences)  . Smokeless tobacco: Never Used  . Tobacco comment: 07/30/2016 "social smoker only; in college"  Substance and Sexual Activity  . Alcohol use: Yes    Comment: 07/30/2016 "couple drinks/month"  . Drug use: No  . Sexual activity: Not Currently  Other Topics Concern  . Not on file  Social History Narrative  . Not on file   Social Determinants  of Health   Financial Resource Strain:   . Difficulty of Paying Living Expenses: Not on file  Food Insecurity:   . Worried About Charity fundraiser in the Last Year: Not on file  . Ran Out of Food in the Last Year: Not on file  Transportation Needs:   . Lack of Transportation (Medical): Not on file  . Lack of Transportation (Non-Medical): Not on file  Physical Activity:   . Days of Exercise per Week: Not on file  . Minutes of Exercise per Session: Not on file  Stress:   . Feeling of Stress : Not on file  Social Connections:   . Frequency of Communication with Friends and Family: Not on file  . Frequency of Social Gatherings with Friends and Family: Not on file  . Attends Religious Services: Not on file  . Active Member of Clubs or Organizations: Not on file  . Attends Archivist Meetings: Not on file  . Marital Status: Not on file     Family History: The patient's family history includes Emphysema in her father; Heart failure in her mother;  Hypertension in her father and mother. ROS:   Please see the history of present illness.   All other systems are reviewed and are negative.   EKGs/Labs/Other Studies Reviewed:     EKG: EKG is ordered today and shows normal sinus rhythm, normal tracing.  QTc 448 ms.  Recent Labs: November 20, 2018 creatinine 1.28, potassium 4.6, normal TSH 08/17/2019 hemoglobin A1c 6%, creatinine 1.17, potassium 4.6 BMET    Component Value Date/Time   NA 139 06/24/2019 1319   K 4.4 06/24/2019 1319   CL 108 06/24/2019 1319   CO2 23 08/13/2016 1556   GLUCOSE 105 (H) 06/24/2019 1319   BUN 19 06/24/2019 1319   CREATININE 1.10 (H) 06/24/2019 1319   CREATININE 1.49 (H) 08/13/2016 1556   CALCIUM 8.6 08/13/2016 1556   GFRNONAA 35 (L) 08/13/2016 1556   GFRAA 41 (L) 08/13/2016 1556     Recent Lipid Panel November 20, 2018 total cholesterol 166, HDL 36, LDL 95, triglycerides 176     Component Value Date/Time   CHOL 146 07/31/2016 0435   TRIG 174 (H) 07/31/2016 0435   HDL 32 (L) 07/31/2016 0435   CHOLHDL 4.6 07/31/2016 0435   VLDL 35 07/31/2016 0435   LDLCALC 79 07/31/2016 0435    Physical Exam:    VS:  BP 133/71   Pulse 67   Ht 5\' 6"  (1.676 m)   Wt 279 lb 12.8 oz (126.9 kg)   SpO2 98%   BMI 45.16 kg/m     Wt Readings from Last 3 Encounters:  01/11/20 279 lb 12.8 oz (126.9 kg)  07/28/19 282 lb (127.9 kg)  06/21/19 281 lb 12.8 oz (127.8 kg)      General: Alert, oriented x3, no distress, morbidly obese Head: no evidence of trauma, PERRL, EOMI, no exophtalmos or lid lag, no myxedema, no xanthelasma; normal ears, nose and oropharynx Neck: normal jugular venous pulsations and no hepatojugular reflux; brisk carotid pulses without delay and faint right carotid bruit Chest: clear to auscultation, no signs of consolidation by percussion or palpation, normal fremitus, symmetrical and full respiratory excursions Cardiovascular: normal position and quality of the apical impulse, regular rhythm, normal  first and second heart sounds, 2/6 early peaking aortic ejection murmur, no diastolic murmurs, rubs or gallops Abdomen: no tenderness or distention, no masses by palpation, no abnormal pulsatility or arterial bruits, normal bowel sounds,  no hepatosplenomegaly Extremities: no clubbing, cyanosis or edema; 2+ radial, ulnar and brachial pulses bilaterally; 2+ right femoral, posterior tibial and dorsalis pedis pulses; 2+ left femoral, posterior tibial and dorsalis pedis pulses; no subclavian or femoral bruits Neurological: grossly nonfocal Psych: Normal mood and affect    ASSESSMENT:    1. Persistent atrial fibrillation ([place])   2. Essential hypertension, benign   3. Long term current use of anticoagulant   4. Diastolic dysfunction   5. OSA (obstructive sleep apnea)   6. Morbid obesity ([place])   7. Nonrheumatic mitral valve regurgitation   8. Nonrheumatic aortic valve insufficiency    PLAN:    In order of problems listed above:  1. AFib: Symptomatic recurrence shortly after second coronavirus vaccine [employer]).  Whether this was a causal relationship or simple coincidence is hard to say.  I would still recommend that she get the booster shot.  Recommended that she take a slightly high dose of beta-blocker for 3 days around the time of the vaccine.  CHADSVasc 3 (age, gender, HTN).  2. Anticoagulation: Compliant, no bleeding complications. 3. HTN: Well-controlled on current regimen. 4. Diastolic dysfunction: This was described on an echo performed almost immediately after cardioversion and cannot exclude a component of atrial mechanical stunning.  She has not had symptoms or signs of heart failure. 5. OSA: Compliant with CPAP and denies daytime hypersomnolence. 6. Obesity: We discussed again the relationship between weight and overall prevalence of atrial fibrillation. 7. MR/AR: Plan to reevaluate her valvular problems (neither 1 of which appear to be hemodynamically significant yet) at about a  3-year interval. 8. R carotid bruit: Radiating from chest.  Medication Adjustments/Labs and Tests Ordered: Current medicines are reviewed at length with the patient today.  Concerns regarding medicines are outlined above.  Orders Placed This Encounter  Procedures  . EKG 12-Lead   No orders of the defined types were placed in this encounter.  Patient Instructions  Medication Instructions:  TAKE 100 mg of the Metoprolol Tartrate twice daily for the next three days only then resume 100 mg in the morning and then 50 mg in the evening.  *If you need a refill on your cardiac medications before your next appointment, please call your pharmacy*   Lab Work: None ordered If you have labs (blood work) drawn today and your tests are completely normal, you will receive your results only by: Marland Kitchen MyChart Message (if you have MyChart) OR . A paper copy in the mail If you have any lab test that is abnormal or we need to change your treatment, we will call you to review the results.   Testing/Procedures: None ordered   Follow-Up: At [facility], you and your health needs are our priority.  As part of our continuing mission to provide you with exceptional heart care, we have created designated Provider Care Teams.  These Care Teams include your primary Cardiologist (physician) and Advanced Practice Providers (APPs -  Physician Assistants and Nurse Practitioners) who all work together to provide you with the care you need, when you need it.  We recommend signing up for the patient portal called "MyChart".  Sign up information is provided on this After Visit Summary.  MyChart is used to connect with patients for Virtual Visits (Telemedicine).  Patients are able to view lab/test results, encounter notes, upcoming appointments, etc.  Non-urgent messages can be sent to your provider as well.   To learn more about what you can do with MyChart, go to [email or web address].  Your next appointment:    12 month(s)  The format for your next appointment:   In Person  Provider:   You may see Sanda Klein, MD or one of the following Advanced Practice Providers on your designated Care Team:    Almyra Deforest, PA-C  Fabian Sharp, [place] or   Roby Lofts, PA-C     Signed, Sanda Klein, MD  01/16/2020 9:24 AM    [place]

## 2020-01-31 ENCOUNTER — Other Ambulatory Visit: Payer: Self-pay | Admitting: Cardiovascular Disease

## 2020-02-10 DIAGNOSIS — G4733 Obstructive sleep apnea (adult) (pediatric): Secondary | ICD-10-CM | POA: Diagnosis not present

## 2020-02-21 DIAGNOSIS — E7800 Pure hypercholesterolemia, unspecified: Secondary | ICD-10-CM | POA: Diagnosis not present

## 2020-02-21 DIAGNOSIS — E039 Hypothyroidism, unspecified: Secondary | ICD-10-CM | POA: Diagnosis not present

## 2020-02-21 DIAGNOSIS — R7303 Prediabetes: Secondary | ICD-10-CM | POA: Diagnosis not present

## 2020-02-21 DIAGNOSIS — I10 Essential (primary) hypertension: Secondary | ICD-10-CM | POA: Diagnosis not present

## 2020-02-21 DIAGNOSIS — M1990 Unspecified osteoarthritis, unspecified site: Secondary | ICD-10-CM | POA: Diagnosis not present

## 2020-03-01 DIAGNOSIS — M6281 Muscle weakness (generalized): Secondary | ICD-10-CM | POA: Diagnosis not present

## 2020-03-01 DIAGNOSIS — K5901 Slow transit constipation: Secondary | ICD-10-CM | POA: Diagnosis not present

## 2020-03-01 DIAGNOSIS — R151 Fecal smearing: Secondary | ICD-10-CM | POA: Diagnosis not present

## 2020-03-17 DIAGNOSIS — D1801 Hemangioma of skin and subcutaneous tissue: Secondary | ICD-10-CM | POA: Diagnosis not present

## 2020-03-17 DIAGNOSIS — L718 Other rosacea: Secondary | ICD-10-CM | POA: Diagnosis not present

## 2020-03-17 DIAGNOSIS — L821 Other seborrheic keratosis: Secondary | ICD-10-CM | POA: Diagnosis not present

## 2020-03-17 DIAGNOSIS — L819 Disorder of pigmentation, unspecified: Secondary | ICD-10-CM | POA: Diagnosis not present

## 2020-03-17 DIAGNOSIS — L918 Other hypertrophic disorders of the skin: Secondary | ICD-10-CM | POA: Diagnosis not present

## 2020-03-17 DIAGNOSIS — L820 Inflamed seborrheic keratosis: Secondary | ICD-10-CM | POA: Diagnosis not present

## 2020-03-17 DIAGNOSIS — L570 Actinic keratosis: Secondary | ICD-10-CM | POA: Diagnosis not present

## 2020-03-17 DIAGNOSIS — D485 Neoplasm of uncertain behavior of skin: Secondary | ICD-10-CM | POA: Diagnosis not present

## 2020-03-17 DIAGNOSIS — L738 Other specified follicular disorders: Secondary | ICD-10-CM | POA: Diagnosis not present

## 2020-03-17 DIAGNOSIS — R208 Other disturbances of skin sensation: Secondary | ICD-10-CM | POA: Diagnosis not present

## 2020-03-22 DIAGNOSIS — R151 Fecal smearing: Secondary | ICD-10-CM | POA: Diagnosis not present

## 2020-03-22 DIAGNOSIS — M6281 Muscle weakness (generalized): Secondary | ICD-10-CM | POA: Diagnosis not present

## 2020-03-22 DIAGNOSIS — K5901 Slow transit constipation: Secondary | ICD-10-CM | POA: Diagnosis not present

## 2020-03-24 ENCOUNTER — Other Ambulatory Visit: Payer: Self-pay | Admitting: Cardiovascular Disease

## 2020-03-24 NOTE — Telephone Encounter (Signed)
Prescription refill request for Eliquis received. Indication: Atrial Fibrillation Last office visit: 12/2019  Croitoru Scr: 1.1  06/2019 Age: 75 Weight: 126.9 kg  Prescription refilled

## 2020-04-20 DIAGNOSIS — K5901 Slow transit constipation: Secondary | ICD-10-CM | POA: Diagnosis not present

## 2020-04-20 DIAGNOSIS — M6281 Muscle weakness (generalized): Secondary | ICD-10-CM | POA: Diagnosis not present

## 2020-04-20 DIAGNOSIS — R151 Fecal smearing: Secondary | ICD-10-CM | POA: Diagnosis not present

## 2020-04-29 ENCOUNTER — Telehealth: Payer: Self-pay | Admitting: Physician Assistant

## 2020-04-29 NOTE — Telephone Encounter (Signed)
75 year old female with persistent atrial fibrillation.  She called in the answering service with recurrent symptoms of atrial fibrillation.  She feels rapid heartbeats.  Her cat was sick and she had to go to the emergency vet last night.  Her apple watch confirmed atrial fibrillation this morning with heart rates 108-115.  She may have some fatigue but has not had chest pain, shortness of breath, dizziness, orthopnea or leg edema.  Blood pressure while I was on the phone with her: 165/111 (she was somewhat frustrated try to get her blood pressure down)  PLAN: Increase metoprolol succinate to 100 mg twice daily Arrange follow-up in the office Monday or Tuesday of this week (or in atrial fibrillation clinic). Continue Apixaban - she has not missed any doses.  She knows to call back if she has any worsening symptoms or heart rate. Richardson Dopp, PA-C 04/29/2020 12:44 PM

## 2020-05-01 NOTE — Telephone Encounter (Signed)
Patient states she is returning a call from today.  

## 2020-05-01 NOTE — Telephone Encounter (Signed)
Called patient back, unable to see who she was returning a call from- I did see message below where they were working to get her an appointment with either afib clinic or with Dr.C- I did advise I would reach out to see if I could find out who had contacted her, as it was possibly to get an appointment.  Dropped call, and was unable to get response from patient.    Will route to Dr.C's RN, as well as nurse at [facility].  Thanks!    Marzetta Board- do you know if anyone has tried to contact this patient to set her up an appointment or if we can do that? She is still in afib, and needs to be seen, we have no APP appointments or MD appointments. Thank you so much!

## 2020-05-02 ENCOUNTER — Other Ambulatory Visit: Payer: Self-pay

## 2020-05-02 ENCOUNTER — Encounter (HOSPITAL_COMMUNITY): Payer: Self-pay | Admitting: Nurse Practitioner

## 2020-05-02 ENCOUNTER — Ambulatory Visit (HOSPITAL_COMMUNITY)
Admission: RE | Admit: 2020-05-02 | Discharge: 2020-05-02 | Disposition: A | Discharge status: Home / Self Care | Payer: PPO | Source: Ambulatory Visit | Attending: Nurse Practitioner | Admitting: Nurse Practitioner

## 2020-05-02 VITALS — BP 138/80 | HR 128 | Ht 66.0 in | Wt 274.2 lb

## 2020-05-02 DIAGNOSIS — D6869 Other thrombophilia: Secondary | ICD-10-CM | POA: Diagnosis not present

## 2020-05-02 DIAGNOSIS — I129 Hypertensive chronic kidney disease with stage 1 through stage 4 chronic kidney disease, or unspecified chronic kidney disease: Secondary | ICD-10-CM | POA: Diagnosis not present

## 2020-05-02 DIAGNOSIS — Z9989 Dependence on other enabling machines and devices: Secondary | ICD-10-CM | POA: Diagnosis not present

## 2020-05-02 DIAGNOSIS — I4819 Other persistent atrial fibrillation: Secondary | ICD-10-CM | POA: Diagnosis not present

## 2020-05-02 DIAGNOSIS — I4891 Unspecified atrial fibrillation: Secondary | ICD-10-CM | POA: Insufficient documentation

## 2020-05-02 DIAGNOSIS — E785 Hyperlipidemia, unspecified: Secondary | ICD-10-CM | POA: Diagnosis not present

## 2020-05-02 DIAGNOSIS — Z6841 Body Mass Index (BMI) 40.0 and over, adult: Secondary | ICD-10-CM | POA: Diagnosis not present

## 2020-05-02 DIAGNOSIS — Z79890 Hormone replacement therapy (postmenopausal): Secondary | ICD-10-CM | POA: Insufficient documentation

## 2020-05-02 DIAGNOSIS — Z79899 Other long term (current) drug therapy: Secondary | ICD-10-CM | POA: Diagnosis not present

## 2020-05-02 DIAGNOSIS — N183 Chronic kidney disease, stage 3 unspecified: Secondary | ICD-10-CM | POA: Insufficient documentation

## 2020-05-02 DIAGNOSIS — E669 Obesity, unspecified: Secondary | ICD-10-CM | POA: Insufficient documentation

## 2020-05-02 DIAGNOSIS — Z01818 Encounter for other preprocedural examination: Secondary | ICD-10-CM | POA: Insufficient documentation

## 2020-05-02 DIAGNOSIS — G4733 Obstructive sleep apnea (adult) (pediatric): Secondary | ICD-10-CM | POA: Insufficient documentation

## 2020-05-02 DIAGNOSIS — Z7901 Long term (current) use of anticoagulants: Secondary | ICD-10-CM | POA: Insufficient documentation

## 2020-05-02 DIAGNOSIS — I080 Rheumatic disorders of both mitral and aortic valves: Secondary | ICD-10-CM | POA: Insufficient documentation

## 2020-05-02 DIAGNOSIS — E039 Hypothyroidism, unspecified: Secondary | ICD-10-CM | POA: Diagnosis not present

## 2020-05-02 LAB — BASIC METABOLIC PANEL
Anion gap: 13 (ref 5–15)
BUN: 17 mg/dL (ref 8–23)
CO2: 20 mmol/L — ABNORMAL LOW (ref 22–32)
Calcium: 8.6 mg/dL — ABNORMAL LOW (ref 8.9–10.3)
Chloride: 105 mmol/L (ref 98–111)
Creatinine, Ser: 1.08 mg/dL — ABNORMAL HIGH (ref 0.44–1.00)
GFR, Estimated: 54 mL/min — ABNORMAL LOW (ref >60)
Glucose, Bld: 108 mg/dL — ABNORMAL HIGH (ref 70–99)
Potassium: 4.5 mmol/L (ref 3.5–5.1)
Sodium: 138 mmol/L (ref 135–145)

## 2020-05-02 LAB — CBC
HCT: 39.4 % (ref 36.0–46.0)
Hemoglobin: 11.4 g/dL — ABNORMAL LOW (ref 12.0–15.0)
MCH: 22.3 pg — ABNORMAL LOW (ref 26.0–34.0)
MCHC: 28.9 g/dL — ABNORMAL LOW (ref 30.0–36.0)
MCV: 77 fL — ABNORMAL LOW (ref 80.0–100.0)
Platelets: 402 10*3/uL — ABNORMAL HIGH (ref 150–400)
RBC: 5.12 MIL/uL — ABNORMAL HIGH (ref 3.87–5.11)
RDW: 17.3 % — ABNORMAL HIGH (ref 11.5–15.5)
WBC: 10.7 10*3/uL — ABNORMAL HIGH (ref 4.0–10.5)
nRBC: 0 % (ref 0.0–0.2)

## 2020-05-02 LAB — TSH: TSH: 2.931 u[IU]/mL (ref 0.350–4.500)

## 2020-05-02 NOTE — Patient Instructions (Signed)
Cardioversion scheduled for Tuesday, January 25th  - Arrive at the [facility] and go to admitting at [facility] not eat or drink anything after midnight the night prior to your procedure.  - Take all your morning medication (except diabetic medications) with a sip of water prior to arrival.  - You will not be able to drive home after your procedure.  - Do NOT miss any doses of your blood thinner - if you should miss a dose please notify our office immediately.  - If you feel as if you go back into normal rhythm prior to scheduled cardioversion, please notify our office immediately. If your procedure is canceled in the cardioversion suite you will be charged a cancellation fee.  Continue metoprolol at 100mg  twice a day until day of cardioversion then reduce back to 100mg  in the morning and 50mg  in the evening.

## 2020-05-02 NOTE — H&P (View-Only) (Signed)
Primary Care Physician: Glenis Smoker, MD Primary Cardiologist: Dr Sallyanne Kuster Primary Electrophysiologist: none Referring Physician: [facility] triage   Leslie [name] is a 75 y.o. female with a history of CKD, HLD, HTN, hypothyroidism, mild-mod MR, mod AI, OSA, and atrial fibrillation who presents for consultation in the [facility].  The patient was initially diagnosed with atrial fibrillation 07/2016 and had DCCV at that time. Beta-blocker dose was decreased in May 2018 when she had junctional rhythm.  After she received her second dose of coronavirus vaccine in February 2021 she developed symptomatic persistent atrial fibrillation with RVR, again challenging to rate control and underwent cardioversion on June 24, 2019. Patient is on Eliquis for a CHADS2VASC score of 3.   She is now in the afib clinic as she went into the afib 1/15 with RVR. She called into the answering service and spoke to [place], [name]. He increased her dose of metoprolol to 100 mg in  the pm (usual dose  100 mg  in am and 50 mg in pm) Her v range at home is 90's to 120. She feels she went into afib with worries over a sick cat and the weather. She  does feel fatigue and shortness of breath with activity in afib. She is compliant with CPAP, minimal caffeine, no alcohol, no tobacco. No missed anticoagulation. Has had all vaccines.  Today, she denies symptoms of  palpitations, chest pain,+ shortness of breath/fatigue, no  orthopnea, PND, lower extremity edema, dizziness, presyncope, syncope, snoring, daytime somnolence, bleeding, or neurologic sequela. The patient is tolerating medications without difficulties and is otherwise without complaint today.    Atrial Fibrillation Risk Factors:  she does have symptoms or diagnosis of sleep apnea. she is compliant with CPAP therapy. she does not have a history of rheumatic fever. she does not have a history of alcohol use. The patient does not  have a history of early familial atrial fibrillation or other arrhythmias.  she has a BMI of Body mass index is 44.26 kg/m.Marland Kitchen Filed Weights   05/02/20 1128  Weight: 124.4 kg    Family History  Problem Relation Age of Onset  . Heart failure Mother   . Hypertension Mother   . Hypertension Father   . Emphysema Father      Atrial Fibrillation Management history:  Previous antiarrhythmic drugs: none Previous cardioversions: 08/01/2016, 06/24/19 Previous ablations: none CHADS2VASC score:  Anticoagulation history: Eliquis   Past Medical History:  Diagnosis Date  . Adenomatous polyp   . Arthritis    "knees, legs, fingers" (07/30/2016)  . Asthma   . Bursitis of left shoulder    "just finished PT" (07/30/2016)  . Chest pain    a. 2003 Abnl stress test-->Cath: nonobs CAD.  Marland Kitchen Chronic bronchitis ([place])   . Chronic kidney disease    stage 3  . GERD (gastroesophageal reflux disease)    barrett's esophagus- Dr. Cristina Gong  . History of hiatal hernia   . Hyperlipidemia   . Hypertension   . Hypothyroidism   . Migraines    "sporatic; at least a few/year" (07/30/2016)  . Mitral regurgitation    a. 07/2016 Echo: mild to mod MR;  b. 07/2016 TEE: mild MR.  . Moderate aortic insufficiency    a. 07/2016 Echo: EF 55-60%, mod AI;  b. 07/2016 TEE: EF 50-55%, mod AI.  Marland Kitchen Obesity    s/p lap band surgery 6/10  . OSA on CPAP   . PAF (paroxysmal atrial fibrillation) ([place])  a. 07/2016 TEE/DCCV: EF 50-55%, mild MR, mod AI, mild to mod TR, neg bubble study-->successful DCCV x1 w/ 120J.  Marland Kitchen PONV (postoperative nausea and vomiting)   . Vitamin D deficiency    Past Surgical History:  Procedure Laterality Date  . APPENDECTOMY     incidental  . CARDIOVERSION N/A 08/01/2016   Procedure: CARDIOVERSION;  Surgeon: Jerline Pain, MD;  Location: [place];  Service: Cardiovascular;  Laterality: N/A;  . CARDIOVERSION N/A 06/24/2019   Procedure: CARDIOVERSION;  Surgeon: Sanda Klein, MD;  Location: [place]  ENDOSCOPY;  Service: Cardiovascular;  Laterality: N/A;  . CATARACT EXTRACTION W/ INTRAOCULAR LENS  IMPLANT, BILATERAL Bilateral ~ [id number]   left - right  . COMBINED HYSTERECTOMY VAGINAL / OOPHORECTOMY / A&P REPAIR  1981  . HERNIA REPAIR    . INGUINAL HERNIA REPAIR Left 1952  . LAPAROSCOPIC GASTRIC BANDING WITH HIATAL HERNIA REPAIR  09/2008  . TEE WITHOUT CARDIOVERSION N/A 08/01/2016   Procedure: TRANSESOPHAGEAL ECHOCARDIOGRAM (TEE);  Surgeon: Jerline Pain, MD;  Location: [place];  Service: Cardiovascular;  Laterality: N/A;  . [place]  . VAGINAL HYSTERECTOMY  1981   "w/1 ovary"    Current Outpatient Medications  Medication Sig Dispense Refill  . acetaminophen (TYLENOL) 650 MG CR tablet Take 1,300 mg by mouth every 8 (eight) hours as needed for pain.    Marland Kitchen atorvastatin (LIPITOR) 10 MG tablet Take 10 mg by mouth daily.    . Azelaic Acid (FINACEA EX) Apply 1 application topically daily.    . Bacillus Coagulans-Inulin (ALIGN PREBIOTIC-PROBIOTIC PO) Take 2 capsules by mouth daily.    . cholecalciferol (VITAMIN D) 1000 UNITS tablet Take 1,000 Units by mouth daily.    . diclofenac Sodium (VOLTAREN) 1 % GEL Apply 1 application topically 2 (two) times daily.    Marland Kitchen ELIQUIS 5 MG TABS tablet TAKE 1 TABLET(5 MG) BY MOUTH TWICE DAILY 180 tablet 1  . fexofenadine (ALLEGRA) 180 MG tablet Take 180 mg by mouth daily.    . fluticasone (FLONASE) 50 MCG/ACT nasal spray Place 2 sprays into both nostrils at bedtime.     . fluticasone (FLOVENT HFA) 44 MCG/ACT inhaler Inhale 2 puffs into the lungs 2 (two) times daily. 1 Inhaler 0  . furosemide (LASIX) 20 MG tablet Take 20 mg by mouth every morning.    . Glucosamine-Chondroitin (OSTEO BI-FLEX REGULAR STRENGTH PO) Take 1 tablet by mouth daily.    . irbesartan (AVAPRO) 150 MG tablet TAKE 1 TABLET(150 MG) BY MOUTH DAILY 90 tablet 3  . levothyroxine (SYNTHROID, LEVOTHROID) 50 MCG tablet Take 50 mcg by mouth daily before breakfast.     . MAGNESIUM-OXIDE 400 (241.3 Mg) MG tablet TAKE 1 TABLET BY MOUTH DAILY 90 tablet 3  . Methylcellulose, Laxative, (CITRUCEL PO) Take 1 Dose by mouth daily in the afternoon.    . metoprolol succinate (TOPROL-XL) 100 MG 24 hr tablet Take 100 mg (one tablet) in the morning and 50 mg (half a tablet) in the evening. 135 tablet 3  . Multiple Vitamins-Minerals (CENTRUM SILVER ADULT 50+ PO) Take 1 tablet by mouth daily.     Marland Kitchen omeprazole (PRILOSEC) 20 MG capsule Take 40 mg by mouth daily before breakfast.    . verapamil (CALAN-SR) 180 MG CR tablet Take 180 mg by mouth 2 (two) times daily.     No current facility-administered medications for this encounter.    Allergies  Allergen Reactions  . Augmentin [Amoxicillin-Pot Clavulanate] Diarrhea    Did it involve swelling  of the face/tongue/throat, SOB, or low BP? No Did it involve sudden or severe rash/hives, skin peeling, or any reaction on the inside of your mouth or nose? No Did you need to seek medical attention at a hospital or doctor's office? No When did it last happen?10 + years If all above answers are "NO", may proceed with cephalosporin use.   . Codeine Nausea Only and Other (See Comments)    Hyperactivity  . Demerol [Meperidine] Nausea And Vomiting  . Sulfa Antibiotics Rash    Social History   Socioeconomic History  . Marital status: Divorced    Spouse name: Not on file  . Number of children: Not on file  . Years of education: Not on file  . Highest education level: Not on file  Occupational History  . Not on file  Tobacco Use  . Smoking status: Former Research scientist (life sciences)  . Smokeless tobacco: Never Used  . Tobacco comment: 07/30/2016 "social smoker only; in college"  Substance and Sexual Activity  . Alcohol use: Yes    Alcohol/week: 2.0 standard drinks    Types: 1 Glasses of wine, 1 Standard drinks or equivalent per week    Comment: 07/30/2016 "couple drinks/month"  . Drug use: No  . Sexual activity: Not Currently  Other Topics  Concern  . Not on file  Social History Narrative  . Not on file   Social Determinants of Health   Financial Resource Strain: Not on file  Food Insecurity: Not on file  Transportation Needs: Not on file  Physical Activity: Not on file  Stress: Not on file  Social Connections: Not on file  Intimate Partner Violence: Not on file     ROS- All systems are reviewed and negative except as per the HPI above.  Physical Exam: Vitals:   05/02/20 1128  BP: 138/80  Pulse: (!) 128  Weight: 124.4 kg  Height: 5\' 6"  (1.676 m)    GEN- The patient is well appearing, alert and oriented x 3 today.   Head- normocephalic, atraumatic Eyes-  Sclera clear, conjunctiva pink Ears- hearing intact Oropharynx- clear Neck- supple  Lungs- Clear to ausculation bilaterally, normal work of breathing Heart- irregular rate and rhythm, no murmurs, rubs or gallops  GI- soft, NT, ND, + BS Extremities- no clubbing, cyanosis, or edema MS- no significant deformity or atrophy Skin- no rash or lesion Psych- euthymic mood, full affect Neuro- strength and sensation are intact  Wt Readings from Last 3 Encounters:  05/02/20 124.4 kg  01/11/20 126.9 kg  07/28/19 127.9 kg    EKG today demonstrates  afib at 128 bpm, qrs int 80 ms, qtc 458 ms   Echo 07/08/19 demonstrated  1. Left ventricular ejection fraction, by estimation, is 60 to 65%. The  left ventricle has normal function. The left ventricle has no regional  wall motion abnormalities. Left ventricular diastolic parameters are  consistent with Grade II diastolic  dysfunction (pseudonormalization). Elevated left ventricular end-diastolic  pressure. The average left ventricular global longitudinal strain is -20.5  %.  2. Right ventricular systolic function is normal. The right ventricular  size is normal. Tricuspid regurgitation signal is inadequate for assessing  PA pressure.  3. Left atrial size was moderately dilated.  4. The mitral valve is normal  in structure. Mild mitral valve  regurgitation. No evidence of mitral stenosis.  5. The aortic valve is normal in structure. Aortic valve regurgitation is  moderate. No aortic stenosis is present. Aortic regurgitation PHT measures  341 msec.  6. The  inferior vena cava is normal in size with greater than 50%  respiratory variability, suggesting right atrial pressure of 3 mmHg.   Comparison(s): 07/21/16 EF 55-60%.   Epic records are reviewed at length today  CHA2DS2-VASc Score = 3  The patient's score is based upon: CHF History: No HTN History: Yes Diabetes History: No Stroke History: No Vascular Disease History: No Age Score: 1 Gender Score: 1    :[id number]}    ASSESSMENT AND PLAN: 1. Persistent Atrial Fibrillation (ICD10:  I48.19) The patient's CHA2DS2-VASc score is 3, indicating a 3.2% annual risk of stroke.   Went into  afib 1/15.  Wishes to purse cardioversion  Feels trigger was stress from  sick cat and weather over the weekend  Continue  with metoprolol 100 mg bid, but go back to usual BB dose 100 mg am and 50 mg pm am of cardioversion  Cbc/bmet/tsh/covid testing   2. Secondary Hypercoagulable State (ICD10:  D68.69) The patient is at significant risk for stroke/thromboembolism based upon her CHA2DS2-VASc Score of 3.  Continue Apixaban (Eliquis).  States no missed anticoagulation for at least 3 weeks    3. Obesity Body mass index is 44.26 kg/m.   4. Obstructive sleep apnea The importance of adequate treatment of sleep apnea was discussed today in order to improve our ability to maintain sinus rhythm long term. Pt is very compliant with CPAP use   5. HTN Stable, no changes today.   F/u here one week after cardioversion    Butch Penny C. Jailene Cupit, [facility] [address] [place], [place] [address] [phone]

## 2020-05-02 NOTE — Progress Notes (Signed)
Primary Care Physician: Glenis Smoker, MD Primary Cardiologist: Dr Sallyanne Kuster Primary Electrophysiologist: none Referring Physician: [facility] triage   Leslie [name] is a 75 y.o. female with a history of CKD, HLD, HTN, hypothyroidism, mild-mod MR, mod AI, OSA, and atrial fibrillation who presents for consultation in the [facility].  The patient was initially diagnosed with atrial fibrillation 07/2016 and had DCCV at that time. Beta-blocker dose was decreased in May 2018 when she had junctional rhythm.  After she received her second dose of coronavirus vaccine in February 2021 she developed symptomatic persistent atrial fibrillation with RVR, again challenging to rate control and underwent cardioversion on June 24, 2019. Patient is on Eliquis for a CHADS2VASC score of 3.   She is now in the afib clinic as she went into the afib 1/15 with RVR. She called into the answering service and spoke to [place], [name]. He increased her dose of metoprolol to 100 mg in  the pm (usual dose  100 mg  in am and 50 mg in pm) Her v range at home is 90's to 120. She feels she went into afib with worries over a sick cat and the weather. She  does feel fatigue and shortness of breath with activity in afib. She is compliant with CPAP, minimal caffeine, no alcohol, no tobacco. No missed anticoagulation. Has had all vaccines.  Today, she denies symptoms of  palpitations, chest pain,+ shortness of breath/fatigue, no  orthopnea, PND, lower extremity edema, dizziness, presyncope, syncope, snoring, daytime somnolence, bleeding, or neurologic sequela. The patient is tolerating medications without difficulties and is otherwise without complaint today.    Atrial Fibrillation Risk Factors:  she does have symptoms or diagnosis of sleep apnea. she is compliant with CPAP therapy. she does not have a history of rheumatic fever. she does not have a history of alcohol use. The patient does not  have a history of early familial atrial fibrillation or other arrhythmias.  she has a BMI of Body mass index is 44.26 kg/m.Marland Kitchen Filed Weights   05/02/20 1128  Weight: 124.4 kg    Family History  Problem Relation Age of Onset  . Heart failure Mother   . Hypertension Mother   . Hypertension Father   . Emphysema Father      Atrial Fibrillation Management history:  Previous antiarrhythmic drugs: none Previous cardioversions: 08/01/2016, 06/24/19 Previous ablations: none CHADS2VASC score:  Anticoagulation history: Eliquis   Past Medical History:  Diagnosis Date  . Adenomatous polyp   . Arthritis    "knees, legs, fingers" (07/30/2016)  . Asthma   . Bursitis of left shoulder    "just finished PT" (07/30/2016)  . Chest pain    a. 2003 Abnl stress test-->Cath: nonobs CAD.  Marland Kitchen Chronic bronchitis ([place])   . Chronic kidney disease    stage 3  . GERD (gastroesophageal reflux disease)    barrett's esophagus- Dr. Cristina Gong  . History of hiatal hernia   . Hyperlipidemia   . Hypertension   . Hypothyroidism   . Migraines    "sporatic; at least a few/year" (07/30/2016)  . Mitral regurgitation    a. 07/2016 Echo: mild to mod MR;  b. 07/2016 TEE: mild MR.  . Moderate aortic insufficiency    a. 07/2016 Echo: EF 55-60%, mod AI;  b. 07/2016 TEE: EF 50-55%, mod AI.  Marland Kitchen Obesity    s/p lap band surgery 6/10  . OSA on CPAP   . PAF (paroxysmal atrial fibrillation) ([place])  a. 07/2016 TEE/DCCV: EF 50-55%, mild MR, mod AI, mild to mod TR, neg bubble study-->successful DCCV x1 w/ 120J.  Marland Kitchen PONV (postoperative nausea and vomiting)   . Vitamin D deficiency    Past Surgical History:  Procedure Laterality Date  . APPENDECTOMY     incidental  . CARDIOVERSION N/A 08/01/2016   Procedure: CARDIOVERSION;  Surgeon: Jerline Pain, MD;  Location: [place];  Service: Cardiovascular;  Laterality: N/A;  . CARDIOVERSION N/A 06/24/2019   Procedure: CARDIOVERSION;  Surgeon: Sanda Klein, MD;  Location: [place]  ENDOSCOPY;  Service: Cardiovascular;  Laterality: N/A;  . CATARACT EXTRACTION W/ INTRAOCULAR LENS  IMPLANT, BILATERAL Bilateral ~ [id number]   left - right  . COMBINED HYSTERECTOMY VAGINAL / OOPHORECTOMY / A&P REPAIR  1981  . HERNIA REPAIR    . INGUINAL HERNIA REPAIR Left 1952  . LAPAROSCOPIC GASTRIC BANDING WITH HIATAL HERNIA REPAIR  09/2008  . TEE WITHOUT CARDIOVERSION N/A 08/01/2016   Procedure: TRANSESOPHAGEAL ECHOCARDIOGRAM (TEE);  Surgeon: Jerline Pain, MD;  Location: [place];  Service: Cardiovascular;  Laterality: N/A;  . [place]  . VAGINAL HYSTERECTOMY  1981   "w/1 ovary"    Current Outpatient Medications  Medication Sig Dispense Refill  . acetaminophen (TYLENOL) 650 MG CR tablet Take 1,300 mg by mouth every 8 (eight) hours as needed for pain.    Marland Kitchen atorvastatin (LIPITOR) 10 MG tablet Take 10 mg by mouth daily.    . Azelaic Acid (FINACEA EX) Apply 1 application topically daily.    . Bacillus Coagulans-Inulin (ALIGN PREBIOTIC-PROBIOTIC PO) Take 2 capsules by mouth daily.    . cholecalciferol (VITAMIN D) 1000 UNITS tablet Take 1,000 Units by mouth daily.    . diclofenac Sodium (VOLTAREN) 1 % GEL Apply 1 application topically 2 (two) times daily.    Marland Kitchen ELIQUIS 5 MG TABS tablet TAKE 1 TABLET(5 MG) BY MOUTH TWICE DAILY 180 tablet 1  . fexofenadine (ALLEGRA) 180 MG tablet Take 180 mg by mouth daily.    . fluticasone (FLONASE) 50 MCG/ACT nasal spray Place 2 sprays into both nostrils at bedtime.     . fluticasone (FLOVENT HFA) 44 MCG/ACT inhaler Inhale 2 puffs into the lungs 2 (two) times daily. 1 Inhaler 0  . furosemide (LASIX) 20 MG tablet Take 20 mg by mouth every morning.    . Glucosamine-Chondroitin (OSTEO BI-FLEX REGULAR STRENGTH PO) Take 1 tablet by mouth daily.    . irbesartan (AVAPRO) 150 MG tablet TAKE 1 TABLET(150 MG) BY MOUTH DAILY 90 tablet 3  . levothyroxine (SYNTHROID, LEVOTHROID) 50 MCG tablet Take 50 mcg by mouth daily before breakfast.     . MAGNESIUM-OXIDE 400 (241.3 Mg) MG tablet TAKE 1 TABLET BY MOUTH DAILY 90 tablet 3  . Methylcellulose, Laxative, (CITRUCEL PO) Take 1 Dose by mouth daily in the afternoon.    . metoprolol succinate (TOPROL-XL) 100 MG 24 hr tablet Take 100 mg (one tablet) in the morning and 50 mg (half a tablet) in the evening. 135 tablet 3  . Multiple Vitamins-Minerals (CENTRUM SILVER ADULT 50+ PO) Take 1 tablet by mouth daily.     Marland Kitchen omeprazole (PRILOSEC) 20 MG capsule Take 40 mg by mouth daily before breakfast.    . verapamil (CALAN-SR) 180 MG CR tablet Take 180 mg by mouth 2 (two) times daily.     No current facility-administered medications for this encounter.    Allergies  Allergen Reactions  . Augmentin [Amoxicillin-Pot Clavulanate] Diarrhea    Did it involve swelling  of the face/tongue/throat, SOB, or low BP? No Did it involve sudden or severe rash/hives, skin peeling, or any reaction on the inside of your mouth or nose? No Did you need to seek medical attention at a hospital or doctor's office? No When did it last happen?10 + years If all above answers are "NO", may proceed with cephalosporin use.   . Codeine Nausea Only and Other (See Comments)    Hyperactivity  . Demerol [Meperidine] Nausea And Vomiting  . Sulfa Antibiotics Rash    Social History   Socioeconomic History  . Marital status: Divorced    Spouse name: Not on file  . Number of children: Not on file  . Years of education: Not on file  . Highest education level: Not on file  Occupational History  . Not on file  Tobacco Use  . Smoking status: Former Research scientist (life sciences)  . Smokeless tobacco: Never Used  . Tobacco comment: 07/30/2016 "social smoker only; in college"  Substance and Sexual Activity  . Alcohol use: Yes    Alcohol/week: 2.0 standard drinks    Types: 1 Glasses of wine, 1 Standard drinks or equivalent per week    Comment: 07/30/2016 "couple drinks/month"  . Drug use: No  . Sexual activity: Not Currently  Other Topics  Concern  . Not on file  Social History Narrative  . Not on file   Social Determinants of Health   Financial Resource Strain: Not on file  Food Insecurity: Not on file  Transportation Needs: Not on file  Physical Activity: Not on file  Stress: Not on file  Social Connections: Not on file  Intimate Partner Violence: Not on file     ROS- All systems are reviewed and negative except as per the HPI above.  Physical Exam: Vitals:   05/02/20 1128  BP: 138/80  Pulse: (!) 128  Weight: 124.4 kg  Height: 5\' 6"  (1.676 m)    GEN- The patient is well appearing, alert and oriented x 3 today.   Head- normocephalic, atraumatic Eyes-  Sclera clear, conjunctiva pink Ears- hearing intact Oropharynx- clear Neck- supple  Lungs- Clear to ausculation bilaterally, normal work of breathing Heart- irregular rate and rhythm, no murmurs, rubs or gallops  GI- soft, NT, ND, + BS Extremities- no clubbing, cyanosis, or edema MS- no significant deformity or atrophy Skin- no rash or lesion Psych- euthymic mood, full affect Neuro- strength and sensation are intact  Wt Readings from Last 3 Encounters:  05/02/20 124.4 kg  01/11/20 126.9 kg  07/28/19 127.9 kg    EKG today demonstrates  afib at 128 bpm, qrs int 80 ms, qtc 458 ms   Echo 07/08/19 demonstrated  1. Left ventricular ejection fraction, by estimation, is 60 to 65%. The  left ventricle has normal function. The left ventricle has no regional  wall motion abnormalities. Left ventricular diastolic parameters are  consistent with Grade II diastolic  dysfunction (pseudonormalization). Elevated left ventricular end-diastolic  pressure. The average left ventricular global longitudinal strain is -20.5  %.  2. Right ventricular systolic function is normal. The right ventricular  size is normal. Tricuspid regurgitation signal is inadequate for assessing  PA pressure.  3. Left atrial size was moderately dilated.  4. The mitral valve is normal  in structure. Mild mitral valve  regurgitation. No evidence of mitral stenosis.  5. The aortic valve is normal in structure. Aortic valve regurgitation is  moderate. No aortic stenosis is present. Aortic regurgitation PHT measures  341 msec.  6. The  inferior vena cava is normal in size with greater than 50%  respiratory variability, suggesting right atrial pressure of 3 mmHg.   Comparison(s): 07/21/16 EF 55-60%.   Epic records are reviewed at length today  CHA2DS2-VASc Score = 3  The patient's score is based upon: CHF History: No HTN History: Yes Diabetes History: No Stroke History: No Vascular Disease History: No Age Score: 1 Gender Score: 1    :[id number]}    ASSESSMENT AND PLAN: 1. Persistent Atrial Fibrillation (ICD10:  I48.19) The patient's CHA2DS2-VASc score is 3, indicating a 3.2% annual risk of stroke.   Went into  afib 1/15.  Wishes to purse cardioversion  Feels trigger was stress from  sick cat and weather over the weekend  Continue  with metoprolol 100 mg bid, but go back to usual BB dose 100 mg am and 50 mg pm am of cardioversion  Cbc/bmet/tsh/covid testing   2. Secondary Hypercoagulable State (ICD10:  D68.69) The patient is at significant risk for stroke/thromboembolism based upon her CHA2DS2-VASc Score of 3.  Continue Apixaban (Eliquis).  States no missed anticoagulation for at least 3 weeks    3. Obesity Body mass index is 44.26 kg/m.   4. Obstructive sleep apnea The importance of adequate treatment of sleep apnea was discussed today in order to improve our ability to maintain sinus rhythm long term. Pt is very compliant with CPAP use   5. HTN Stable, no changes today.   F/u here one week after cardioversion    Butch Penny C. [place], [facility] [address] [place], [place] [address] [phone]

## 2020-05-02 NOTE — Telephone Encounter (Signed)
Patient scheduled for 11:30am with Afib clinic. Will cancel 9:45 01/19 with Kerin Ransom.

## 2020-05-02 NOTE — Telephone Encounter (Signed)
Patient c/o Palpitations:  High priority if patient c/o lightheadedness, shortness of breath, or chest pain  1) How long have you had palpitations/irregular HR/ Afib? Are you having the symptoms now? Since Saturday  2) Are you currently experiencing lightheadedness, SOB or CP? no  3) Do you have a history of afib (atrial fibrillation) or irregular heart rhythm? yes  4) Have you checked your BP or HR? (document readings if available): no  5) Are you experiencing any other symptoms? Fatigue, heart fluttering   Patient said she never got a call back yesterday. Patient feels like she "fell through the cracks"

## 2020-05-02 NOTE — Telephone Encounter (Signed)
Spoke with patient. She is still in afib per her ECG on her home device. HR is 98. No BP at time of call. Symptoms have not worsened. She feels extremely fatigued after any activity. Denies CP and SOB.   Patient placed on schedule for Kerin Ransom, [name] 1/19 at 9:45am. Will send to Afib clinic to see if sooner appointment is available.  Patient to call back if symptoms worsen.

## 2020-05-03 ENCOUNTER — Ambulatory Visit: Payer: PPO | Admitting: Cardiology

## 2020-05-03 NOTE — Telephone Encounter (Signed)
Going for cardioversion

## 2020-05-06 ENCOUNTER — Other Ambulatory Visit (HOSPITAL_COMMUNITY)
Admission: RE | Admit: 2020-05-06 | Discharge: 2020-05-06 | Disposition: A | Discharge status: Home / Self Care | Payer: PPO | Source: Ambulatory Visit | Attending: Cardiology | Admitting: Cardiology

## 2020-05-06 DIAGNOSIS — Z01812 Encounter for preprocedural laboratory examination: Secondary | ICD-10-CM | POA: Diagnosis not present

## 2020-05-06 DIAGNOSIS — Z20822 Contact with and (suspected) exposure to covid-19: Secondary | ICD-10-CM | POA: Insufficient documentation

## 2020-05-06 LAB — SARS CORONAVIRUS 2 (TAT 6-24 HRS): SARS Coronavirus 2: NEGATIVE

## 2020-05-09 ENCOUNTER — Ambulatory Visit (HOSPITAL_COMMUNITY)
Admission: RE | Admit: 2020-05-09 | Discharge: 2020-05-09 | Disposition: A | Discharge status: Home / Self Care | Payer: PPO | Attending: Cardiology | Admitting: Cardiology

## 2020-05-09 ENCOUNTER — Ambulatory Visit (HOSPITAL_COMMUNITY): Payer: PPO | Admitting: Certified Registered Nurse Anesthetist

## 2020-05-09 ENCOUNTER — Encounter (HOSPITAL_COMMUNITY): Payer: Self-pay | Admitting: Cardiology

## 2020-05-09 ENCOUNTER — Encounter (HOSPITAL_COMMUNITY)
Admission: RE | Disposition: A | Discharge status: Home / Self Care | Payer: Self-pay | Source: Home / Self Care | Attending: Cardiology

## 2020-05-09 DIAGNOSIS — Z7901 Long term (current) use of anticoagulants: Secondary | ICD-10-CM | POA: Diagnosis not present

## 2020-05-09 DIAGNOSIS — Z6841 Body Mass Index (BMI) 40.0 and over, adult: Secondary | ICD-10-CM | POA: Insufficient documentation

## 2020-05-09 DIAGNOSIS — G4733 Obstructive sleep apnea (adult) (pediatric): Secondary | ICD-10-CM | POA: Diagnosis not present

## 2020-05-09 DIAGNOSIS — D6869 Other thrombophilia: Secondary | ICD-10-CM | POA: Diagnosis not present

## 2020-05-09 DIAGNOSIS — Z882 Allergy status to sulfonamides status: Secondary | ICD-10-CM | POA: Insufficient documentation

## 2020-05-09 DIAGNOSIS — Z87891 Personal history of nicotine dependence: Secondary | ICD-10-CM | POA: Insufficient documentation

## 2020-05-09 DIAGNOSIS — I4819 Other persistent atrial fibrillation: Secondary | ICD-10-CM

## 2020-05-09 DIAGNOSIS — Z881 Allergy status to other antibiotic agents status: Secondary | ICD-10-CM | POA: Diagnosis not present

## 2020-05-09 DIAGNOSIS — E669 Obesity, unspecified: Secondary | ICD-10-CM | POA: Diagnosis not present

## 2020-05-09 DIAGNOSIS — I080 Rheumatic disorders of both mitral and aortic valves: Secondary | ICD-10-CM | POA: Diagnosis not present

## 2020-05-09 DIAGNOSIS — Z79899 Other long term (current) drug therapy: Secondary | ICD-10-CM | POA: Insufficient documentation

## 2020-05-09 DIAGNOSIS — Z79890 Hormone replacement therapy (postmenopausal): Secondary | ICD-10-CM | POA: Insufficient documentation

## 2020-05-09 DIAGNOSIS — I4891 Unspecified atrial fibrillation: Secondary | ICD-10-CM | POA: Diagnosis not present

## 2020-05-09 DIAGNOSIS — Z885 Allergy status to narcotic agent status: Secondary | ICD-10-CM | POA: Diagnosis not present

## 2020-05-09 DIAGNOSIS — Z8249 Family history of ischemic heart disease and other diseases of the circulatory system: Secondary | ICD-10-CM | POA: Insufficient documentation

## 2020-05-09 DIAGNOSIS — Z9884 Bariatric surgery status: Secondary | ICD-10-CM | POA: Diagnosis not present

## 2020-05-09 DIAGNOSIS — E559 Vitamin D deficiency, unspecified: Secondary | ICD-10-CM | POA: Diagnosis not present

## 2020-05-09 DIAGNOSIS — I10 Essential (primary) hypertension: Secondary | ICD-10-CM | POA: Diagnosis not present

## 2020-05-09 DIAGNOSIS — E7800 Pure hypercholesterolemia, unspecified: Secondary | ICD-10-CM | POA: Diagnosis not present

## 2020-05-09 HISTORY — PX: CARDIOVERSION: SHX1299

## 2020-05-09 LAB — POCT I-STAT, CHEM 8
BUN: 22 mg/dL (ref 8–23)
Calcium, Ion: 1.15 mmol/L (ref 1.15–1.40)
Chloride: 103 mmol/L (ref 98–111)
Creatinine, Ser: 1.1 mg/dL — ABNORMAL HIGH (ref 0.44–1.00)
Glucose, Bld: 112 mg/dL — ABNORMAL HIGH (ref 70–99)
HCT: 37 % (ref 36.0–46.0)
Hemoglobin: 12.6 g/dL (ref 12.0–15.0)
Potassium: 4.2 mmol/L (ref 3.5–5.1)
Sodium: 139 mmol/L (ref 135–145)
TCO2: 22 mmol/L (ref 22–32)

## 2020-05-09 SURGERY — CARDIOVERSION
Anesthesia: General

## 2020-05-09 MED ORDER — LIDOCAINE 2% (20 MG/ML) 5 ML SYRINGE
INTRAMUSCULAR | Status: DC | PRN
  Administered 2020-05-09: 60 mg via INTRAVENOUS

## 2020-05-09 MED ORDER — PROPOFOL 10 MG/ML IV BOLUS
INTRAVENOUS | Status: DC | PRN
  Administered 2020-05-09: 70 mg via INTRAVENOUS

## 2020-05-09 MED ORDER — SODIUM CHLORIDE 0.9 % IV SOLN: INTRAVENOUS | Status: DC | PRN

## 2020-05-09 NOTE — CV Procedure (Addendum)
Procedure:   DCCV  Indication:  Symptomatic atrial fibrillation  Procedure Note:  The patient signed informed consent.  They have had had therapeutic anticoagulation with Eliquis greater than 3 weeks.  Anesthesia was administered by Dr. Jillyn Hidden.  Adequate airway was maintained throughout and vital followed per protocol.  They were cardioverted x 1 with 200J of biphasic synchronized energy.  They converted to NSR with rate 60-70s.  Patient reported no symptoms but was noted to have what appeared to be a small burn under her left breast where pad was placed.  Suspect this was due to residual powder she reports she had put under her breast last night.  She denied any pain in this area. The patient had normal neuro status and respiratory status post procedure with vitals stable as recorded elsewhere.    Follow up:  They will continue on current medical therapy and follow up with cardiology as scheduled.  Oswaldo Milian, MD 05/09/2020 9:13 AM

## 2020-05-09 NOTE — Interval H&P Note (Signed)
History and Physical Interval Note:  05/09/2020 8:50 AM  Leslie [name]  has presented today for surgery, with the diagnosis of A-FIB.  The various methods of treatment have been discussed with the patient and family. After consideration of risks, benefits and other options for treatment, the patient has consented to  Procedure(s): CARDIOVERSION (N/A) as a surgical intervention.  The patient's history has been reviewed, patient examined, no change in status, stable for surgery.  I have reviewed the patient's chart and labs.  Questions were answered to the patient's satisfaction.     [name]

## 2020-05-09 NOTE — Anesthesia Preprocedure Evaluation (Signed)
Anesthesia Evaluation  Patient identified by MRN, date of birth, ID band Patient awake    Reviewed: Allergy & Precautions, NPO status , Patient's Chart, lab work & pertinent test results, reviewed documented beta blocker date and time   History of Anesthesia Complications (+) PONV and history of anesthetic complications  Airway Mallampati: II  TM Distance: >3 FB Neck ROM: Full    Dental no notable dental hx. (+) Teeth Intact, Dental Advisory Given   Pulmonary asthma , sleep apnea , former smoker,    Pulmonary exam normal        Cardiovascular hypertension, Pt. on medications and Pt. on home beta blockers Normal cardiovascular exam+ dysrhythmias Atrial Fibrillation + Valvular Problems/Murmurs AI and MR  Rhythm:Irregular Rate:Normal + Systolic murmurs  12/8919 Echo: EF 55-60%, mod AI;  b. 07/2016 TEE: EF 50-55%, mod AI.   Neuro/Psych  Headaches, negative psych ROS   GI/Hepatic Neg liver ROS, hiatal hernia, GERD  ,  Endo/Other  Hypothyroidism Morbid obesity  Renal/GU Renal InsufficiencyRenal disease  negative genitourinary   Musculoskeletal  (+) Arthritis ,   Abdominal (+) + obese,   Peds  Hematology   Anesthesia Other Findings ventricular ejection fraction, by estimation, is 60 to 65%. The left ventricle has normal function. The left ventricle has no regional wall motion abnormalities. Left ventricular diastolic parameters are consistent with Grade II diastolic dysfunction (pseudonormalization). Elevated left ventricular end-diastolic pressure. The average left ventricular global longitudinal strain is -20.5 %. 2. Right ventricular systolic function is normal. The right ventricular size is normal. Tricuspid regurgitation signal is inadequate for assessing PA pressure. 3. Left atrial size was moderately dilated. 4. The mitral valve is normal in structure. Mild mitral valve regurgitation. No evidence of  mitral stenosis. 5. The aortic valve is normal in structure. Aortic valve regurgitation is moderate. No aortic stenosis is present. Aortic regurgitation PHT measures 341 msec. 6. The inferior vena cava is normal in size with greater than 50% respiratory variability, suggesting right atrial pressure of 3 mmHg. Compari  Reproductive/Obstetrics                             Anesthesia Physical  Anesthesia Plan  ASA: III  Anesthesia Plan: General   Post-op Pain Management:    Induction: Intravenous  PONV Risk Score and Plan: Treatment may vary due to age or medical condition, Propofol infusion and TIVA  Airway Management Planned: Mask  Additional Equipment: None  Intra-op Plan:   Post-operative Plan:   Informed Consent: I have reviewed the patients History and Physical, chart, labs and discussed the procedure including the risks, benefits and alternatives for the proposed anesthesia with the patient or authorized representative who has indicated his/her understanding and acceptance.     Dental advisory given  Plan Discussed with: CRNA  Anesthesia Plan Comments:         Anesthesia Quick Evaluation

## 2020-05-09 NOTE — Discharge Instructions (Signed)
Electrical Cardioversion Electrical cardioversion is the delivery of a jolt of electricity to restore a normal rhythm to the heart. A rhythm that is too fast or is not regular keeps the heart from pumping well. In this procedure, sticky patches or metal paddles are placed on the chest to deliver electricity to the heart from a device. This procedure may be done in an emergency if:  There is low or no blood pressure as a result of the heart rhythm.  Normal rhythm must be restored as fast as possible to protect the brain and heart from further damage.  It may save a life. This may also be a scheduled procedure for irregular or fast heart rhythms that are not immediately life-threatening. Tell a health care provider about:  Any allergies you have.  All medicines you are taking, including vitamins, herbs, eye drops, creams, and over-the-counter medicines.  Any problems you or family members have had with anesthetic medicines.  Any blood disorders you have.  Any surgeries you have had.  Any medical conditions you have.  Whether you are pregnant or may be pregnant. What are the risks? Generally, this is a safe procedure. However, problems may occur, including:  Allergic reactions to medicines.  A blood clot that breaks free and travels to other parts of your body.  The possible return of an abnormal heart rhythm within hours or days after the procedure.  Your heart stopping (cardiac arrest). This is rare. What happens before the procedure? Medicines  Your health care provider may have you start taking: ? Blood-thinning medicines (anticoagulants) so your blood does not clot as easily. ? Medicines to help stabilize your heart rate and rhythm.  Ask your health care provider about: ? Changing or stopping your regular medicines. This is especially important if you are taking diabetes medicines or blood thinners. ? Taking medicines such as aspirin and ibuprofen. These medicines can  thin your blood. Do not take these medicines unless your health care provider tells you to take them. ? Taking over-the-counter medicines, vitamins, herbs, and supplements. General instructions  Follow instructions from your health care provider about eating or drinking restrictions.  Plan to have someone take you home from the hospital or clinic.  If you will be going home right after the procedure, plan to have someone with you for 24 hours.  Ask your health care provider what steps will be taken to help prevent infection. These may include washing your skin with a germ-killing soap. What happens during the procedure?  An IV will be inserted into one of your veins.  Sticky patches (electrodes) or metal paddles may be placed on your chest.  You will be given a medicine to help you relax (sedative).  An electrical shock will be delivered. The procedure may vary among health care providers and hospitals.   What can I expect after the procedure?  Your blood pressure, heart rate, breathing rate, and blood oxygen level will be monitored until you leave the hospital or clinic.  Your heart rhythm will be watched to make sure it does not change.  You may have some redness on the skin where the shocks were given. Follow these instructions at home:  Do not drive for 24 hours if you were given a sedative during your procedure.  Take over-the-counter and prescription medicines only as told by your health care provider.  Ask your health care provider how to check your pulse. Check it often.  Rest for 48 hours after the procedure   or as told by your health care provider.  Avoid or limit your caffeine use as told by your health care provider.  Keep all follow-up visits as told by your health care provider. This is important. Contact a health care provider if:  You feel like your heart is beating too quickly or your pulse is not regular.  You have a serious muscle cramp that does not go  away. Get help right away if:  You have discomfort in your chest.  You are dizzy or you feel faint.  You have trouble breathing or you are short of breath.  Your speech is slurred.  You have trouble moving an arm or leg on one side of your body.  Your fingers or toes turn cold or blue. Summary  Electrical cardioversion is the delivery of a jolt of electricity to restore a normal rhythm to the heart.  This procedure may be done right away in an emergency or may be a scheduled procedure if the condition is not an emergency.  Generally, this is a safe procedure.  After the procedure, check your pulse often as told by your health care provider. This information is not intended to replace advice given to you by your health care provider. Make sure you discuss any questions you have with your health care provider. Document Revised: 11/02/2018 Document Reviewed: 11/02/2018 Elsevier Patient Education  2021 [employer]  

## 2020-05-09 NOTE — Anesthesia Procedure Notes (Signed)
Procedure Name: MAC Date/Time: 05/09/2020 9:02 AM Performed by: Inda Coke, CRNA Pre-anesthesia Checklist: Patient identified, Emergency Drugs available, Suction available, Timeout performed and Patient being monitored Patient Re-evaluated:Patient Re-evaluated prior to induction Oxygen Delivery Method: Ambu bag Preoxygenation: Pre-oxygenation with 100% oxygen Induction Type: IV induction Dental Injury: Teeth and Oropharynx as per pre-operative assessment

## 2020-05-09 NOTE — Transfer of Care (Signed)
Immediate Anesthesia Transfer of Care Note  Patient: Leslie [name]  Procedure(s) Performed: CARDIOVERSION (N/A )  Patient Location: Endoscopy Unit  Anesthesia Type:MAC  Level of Consciousness: awake, alert  and oriented  Airway & Oxygen Therapy: Patient Spontanous Breathing  Post-op Assessment: Report given to RN and Post -op Vital signs reviewed and stable  Post vital signs: Reviewed and stable  Last Vitals:  Vitals Value Taken Time  BP 108/46 05/09/20 0912  Temp    Pulse 69 05/09/20 0915  Resp 13 05/09/20 0915  SpO2 95 % 05/09/20 0915    Last Pain:  Vitals:   05/09/20 0843  TempSrc: Tympanic  PainSc: 3          Complications: No complications documented.

## 2020-05-10 NOTE — Anesthesia Postprocedure Evaluation (Signed)
Anesthesia Post Note  Patient: Leslie [name]  Procedure(s) Performed: CARDIOVERSION (N/A )     Patient location during evaluation: Endoscopy Anesthesia Type: General Level of consciousness: awake and sedated Pain management: pain level controlled Vital Signs Assessment: post-procedure vital signs reviewed and stable Respiratory status: spontaneous breathing Cardiovascular status: stable Postop Assessment: no apparent nausea or vomiting Anesthetic complications: no   No complications documented.  Last Vitals:  Vitals:   05/09/20 0915 05/09/20 0925  BP: 92/65 (!) 97/58  Pulse: 69   Resp: 13   Temp: 36.7 C   SpO2: 95%     Last Pain:  Vitals:   05/09/20 0915  TempSrc: Tympanic  PainSc: 3    Pain Goal:                   Huston Foley

## 2020-05-10 NOTE — Progress Notes (Signed)
Thanks

## 2020-05-11 ENCOUNTER — Encounter (HOSPITAL_COMMUNITY): Payer: Self-pay | Admitting: Cardiology

## 2020-05-15 ENCOUNTER — Telehealth: Payer: Self-pay | Admitting: Cardiovascular Disease

## 2020-05-15 NOTE — Telephone Encounter (Signed)
Spoke with pt, she has an appointment in the atrial fib clinic tomorrow and she wanted to touch base with dr croitoru to get a plan for that appointment tomorrow. Today she feels every heart beat and is tired. She wants to make sure the atrial fib clinic is aware of how dr croitoru would like to proceed. Will forward to dr c to review and advise.

## 2020-05-15 NOTE — Telephone Encounter (Signed)
Absolutely fine. Should keep that appt, most efficient way to be re-evaluated. They are always in touch with me and we discuss any change in strategy. Also the quickest way to get EP involved if this becomes necessary.

## 2020-05-15 NOTE — Telephone Encounter (Signed)
Patient c/o Palpitations:  High priority if patient c/o lightheadedness, shortness of breath, or chest pain  1) How long have you had palpitations/irregular HR/ Afib? Are you having the symptoms now? Since Saturday 05/13/20 evening is having symptoms now   2) Are you currently experiencing lightheadedness, SOB or CP? No   3) Do you have a history of afib (atrial fibrillation) or irregular heart rhythm? Yes   4) Have you checked your BP or HR? (document readings if available):   116/79 HR 87 (this morning)   146/99 HR 87 (lastnight)  5) Are you experiencing any other symptoms? No   First occurrence since procedure on 05/09/20.

## 2020-05-15 NOTE — Telephone Encounter (Signed)
Spoke with pt, aware of dr c recommendations.

## 2020-05-16 ENCOUNTER — Other Ambulatory Visit: Payer: Self-pay

## 2020-05-16 ENCOUNTER — Ambulatory Visit (HOSPITAL_COMMUNITY)
Admission: RE | Admit: 2020-05-16 | Discharge: 2020-05-16 | Disposition: A | Discharge status: Home / Self Care | Payer: PPO | Source: Ambulatory Visit | Attending: Nurse Practitioner | Admitting: Nurse Practitioner

## 2020-05-16 ENCOUNTER — Encounter (HOSPITAL_COMMUNITY): Payer: Self-pay | Admitting: Nurse Practitioner

## 2020-05-16 VITALS — BP 130/84 | HR 140 | Ht 66.0 in | Wt 274.4 lb

## 2020-05-16 DIAGNOSIS — E669 Obesity, unspecified: Secondary | ICD-10-CM | POA: Insufficient documentation

## 2020-05-16 DIAGNOSIS — I129 Hypertensive chronic kidney disease with stage 1 through stage 4 chronic kidney disease, or unspecified chronic kidney disease: Secondary | ICD-10-CM | POA: Diagnosis not present

## 2020-05-16 DIAGNOSIS — G4733 Obstructive sleep apnea (adult) (pediatric): Secondary | ICD-10-CM | POA: Insufficient documentation

## 2020-05-16 DIAGNOSIS — E785 Hyperlipidemia, unspecified: Secondary | ICD-10-CM | POA: Diagnosis not present

## 2020-05-16 DIAGNOSIS — I4819 Other persistent atrial fibrillation: Secondary | ICD-10-CM | POA: Diagnosis not present

## 2020-05-16 DIAGNOSIS — Z6841 Body Mass Index (BMI) 40.0 and over, adult: Secondary | ICD-10-CM | POA: Diagnosis not present

## 2020-05-16 DIAGNOSIS — Z7901 Long term (current) use of anticoagulants: Secondary | ICD-10-CM | POA: Insufficient documentation

## 2020-05-16 DIAGNOSIS — D6869 Other thrombophilia: Secondary | ICD-10-CM | POA: Insufficient documentation

## 2020-05-16 DIAGNOSIS — N189 Chronic kidney disease, unspecified: Secondary | ICD-10-CM | POA: Insufficient documentation

## 2020-05-16 NOTE — Progress Notes (Signed)
Can we please schedule for a Lexiscan Myoview ASAP? For possible CAD, before we start flecainide for AFib.

## 2020-05-16 NOTE — Progress Notes (Signed)
Primary Care Physician: Glenis Smoker, MD Primary Cardiologist: Dr Sallyanne Kuster Primary Electrophysiologist: none Referring Physician: [facility] triage   Leslie [name] is a 75 y.o. female with a history of CKD, HLD, HTN, hypothyroidism, mild-mod MR, mod AI, OSA, and atrial fibrillation who presents for consultation in the [facility].  The patient was initially diagnosed with atrial fibrillation 07/2016 and had DCCV at that time. Beta-blocker dose was decreased in May 2018 when she had junctional rhythm.  After she received her second dose of coronavirus vaccine in February 2021 she developed symptomatic persistent atrial fibrillation with RVR, again challenging to rate control and underwent cardioversion on June 24, 2019. Patient is on Eliquis for a CHADS2VASC score of 3.   She went back  into the afib 04/29/20  with RVR. She called into the answering service and spoke to [name], [name]. He increased her dose of metoprolol to 100 mg in  the pm (usual dose  100 mg  in am and 50 mg in pm) Her v range at home is 90's to 120. She feels she went into afib with worries over a sick cat and the weather. She  does feel fatigue and shortness of breath with activity in afib. She is compliant with CPAP, minimal caffeine, no alcohol, no tobacco. No missed anticoagulation. Has had all vaccines.  F/u successful cardioversion, 05/09/20. unfortunately she is back in afib with RVR. Dicussed antiarrythmic's with pt and with Dr. Loletha Grayer. We discussed use of flecainide as she does not want to use amiodarone with her known thyroid disease. She  will need a stress test which Dr. Loletha Grayer will coordinate and then will get loaded on flecainide with a low risk test and try to get cardioverted in a timely manner. Pt is in agreement. No missed anticoagulation. No conduction issues on ekg's in SR. EF was normal on latest echo.   Today, she denies symptoms of  palpitations, chest pain,+ shortness of  breath/fatigue, no  orthopnea, PND, lower extremity edema, dizziness, presyncope, syncope, snoring, daytime somnolence, bleeding, or neurologic sequela. The patient is tolerating medications without difficulties and is otherwise without complaint today.    Atrial Fibrillation Risk Factors:  she does have symptoms or diagnosis of sleep apnea. she is compliant with CPAP therapy. she does not have a history of rheumatic fever. she does not have a history of alcohol use. The patient does not have a history of early familial atrial fibrillation or other arrhythmias.  she has a BMI of Body mass index is 44.29 kg/m.Marland Kitchen Filed Weights   05/16/20 1122  Weight: 124.5 kg    Family History  Problem Relation Age of Onset  . Heart failure Mother   . Hypertension Mother   . Hypertension Father   . Emphysema Father      Atrial Fibrillation Management history:  Previous antiarrhythmic drugs: none Previous cardioversions: 08/01/2016, 06/24/19 Previous ablations: none CHADS2VASC score:  Anticoagulation history: Eliquis   Past Medical History:  Diagnosis Date  . Adenomatous polyp   . Arthritis    "knees, legs, fingers" (07/30/2016)  . Asthma   . Bursitis of left shoulder    "just finished PT" (07/30/2016)  . Chest pain    a. 2003 Abnl stress test-->Cath: nonobs CAD.  Marland Kitchen Chronic bronchitis ([place])   . Chronic kidney disease    stage 3  . GERD (gastroesophageal reflux disease)    barrett's esophagus- Dr. Cristina Gong  . History of hiatal hernia   . Hyperlipidemia   .  Hypertension   . Hypothyroidism   . Migraines    "sporatic; at least a few/year" (07/30/2016)  . Mitral regurgitation    a. 07/2016 Echo: mild to mod MR;  b. 07/2016 TEE: mild MR.  . Moderate aortic insufficiency    a. 07/2016 Echo: EF 55-60%, mod AI;  b. 07/2016 TEE: EF 50-55%, mod AI.  Marland Kitchen Obesity    s/p lap band surgery 6/10  . OSA on CPAP   . PAF (paroxysmal atrial fibrillation) ([place])    a. 07/2016 TEE/DCCV: EF 50-55%, mild MR,  mod AI, mild to mod TR, neg bubble study-->successful DCCV x1 w/ 120J.  Marland Kitchen PONV (postoperative nausea and vomiting)   . Vitamin D deficiency    Past Surgical History:  Procedure Laterality Date  . APPENDECTOMY     incidental  . CARDIOVERSION N/A 08/01/2016   Procedure: CARDIOVERSION;  Surgeon: Jerline Pain, MD;  Location: [place];  Service: Cardiovascular;  Laterality: N/A;  . CARDIOVERSION N/A 06/24/2019   Procedure: CARDIOVERSION;  Surgeon: Sanda Klein, MD;  Location: [facility];  Service: Cardiovascular;  Laterality: N/A;  . CARDIOVERSION N/A 05/09/2020   Procedure: CARDIOVERSION;  Surgeon: [name], MD;  Location: [facility];  Service: Cardiovascular;  Laterality: N/A;  . CATARACT EXTRACTION W/ INTRAOCULAR LENS  IMPLANT, BILATERAL Bilateral ~ 2008-2017   left - right  . COMBINED HYSTERECTOMY VAGINAL / OOPHORECTOMY / A&P REPAIR  1981  . HERNIA REPAIR    . INGUINAL HERNIA REPAIR Left 1952  . LAPAROSCOPIC GASTRIC BANDING WITH HIATAL HERNIA REPAIR  09/2008  . TEE WITHOUT CARDIOVERSION N/A 08/01/2016   Procedure: TRANSESOPHAGEAL ECHOCARDIOGRAM (TEE);  Surgeon: Jerline Pain, MD;  Location: [place] [facility];  Service: Cardiovascular;  Laterality: N/A;  . [place]  . VAGINAL HYSTERECTOMY  1981   "w/1 ovary"    Current Outpatient Medications  Medication Sig Dispense Refill  . acetaminophen (TYLENOL) 650 MG CR tablet Take 1,300 mg by mouth every 8 (eight) hours as needed for pain.    Marland Kitchen atorvastatin (LIPITOR) 10 MG tablet Take 10 mg by mouth daily.    . Azelaic Acid (FINACEA EX) Apply 1 application topically daily. Face    . Bacillus Coagulans-Inulin (ALIGN PREBIOTIC-PROBIOTIC PO) Take 2 capsules by mouth daily.    . cholecalciferol (VITAMIN D) 1000 UNITS tablet Take 1,000 Units by mouth daily.    . diclofenac Sodium (VOLTAREN) 1 % GEL Apply 1 application topically 2 (two) times daily.    Marland Kitchen ELIQUIS 5 MG TABS tablet TAKE 1 TABLET(5 MG) BY  MOUTH TWICE DAILY (Patient taking differently: Take 5 mg by mouth 2 (two) times daily.) 180 tablet 1  . fexofenadine (ALLEGRA) 180 MG tablet Take 180 mg by mouth daily.    . fluticasone (FLONASE) 50 MCG/ACT nasal spray Place 2 sprays into both nostrils at bedtime.     . fluticasone (FLOVENT HFA) 44 MCG/ACT inhaler Inhale 2 puffs into the lungs 2 (two) times daily. (Patient taking differently: Inhale 2 puffs into the lungs daily.) 1 Inhaler 0  . furosemide (LASIX) 20 MG tablet Take 20 mg by mouth every morning.    . Glucosamine-Chondroitin (OSTEO BI-FLEX REGULAR STRENGTH PO) Take 1 tablet by mouth daily.    . irbesartan (AVAPRO) 150 MG tablet TAKE 1 TABLET(150 MG) BY MOUTH DAILY (Patient taking differently: Take 150 mg by mouth daily.) 90 tablet 3  . levothyroxine (SYNTHROID, LEVOTHROID) 50 MCG tablet Take 50 mcg by mouth daily before breakfast.    .  MAGNESIUM-OXIDE 400 (241.3 Mg) MG tablet TAKE 1 TABLET BY MOUTH DAILY (Patient taking differently: Take 400 mg by mouth daily.) 90 tablet 3  . metoprolol succinate (TOPROL-XL) 100 MG 24 hr tablet Take 100 mg (one tablet) in the morning and 50 mg (half a tablet) in the evening. (Patient taking differently: Take 100 mg by mouth See admin instructions. Take 100 mg (one tablet) in the morning and 50 mg (half a tablet) in the evening.) 135 tablet 3  . Multiple Vitamins-Minerals (CENTRUM SILVER ADULT 50+ PO) Take 1 tablet by mouth daily. Gummie    . omeprazole (PRILOSEC) 20 MG capsule Take 40 mg by mouth daily before breakfast.    . Psyllium (METAMUCIL) 28.3 % POWD Take 1 Scoop by mouth daily.    Marland Kitchen senna (SENOKOT) 8.6 MG tablet Take 1 tablet by mouth daily.    . verapamil (CALAN-SR) 180 MG CR tablet Take 180 mg by mouth 2 (two) times daily.     No current facility-administered medications for this encounter.    Allergies  Allergen Reactions  . Augmentin [Amoxicillin-Pot Clavulanate] Diarrhea    Did it involve swelling of the face/tongue/throat, SOB, or  low BP? No Did it involve sudden or severe rash/hives, skin peeling, or any reaction on the inside of your mouth or nose? No Did you need to seek medical attention at a hospital or doctor's office? No When did it last happen?10 + years If all above answers are "NO", may proceed with cephalosporin use.   . Codeine Nausea Only and Other (See Comments)    Hyperactivity  . Demerol [Meperidine] Nausea And Vomiting  . Sulfa Antibiotics Rash    Social History   Socioeconomic History  . Marital status: Divorced    Spouse name: Not on file  . Number of children: Not on file  . Years of education: Not on file  . Highest education level: Not on file  Occupational History  . Not on file  Tobacco Use  . Smoking status: Former Research scientist (life sciences)  . Smokeless tobacco: Never Used  . Tobacco comment: 07/30/2016 "social smoker only; in college"  Substance and Sexual Activity  . Alcohol use: Yes    Alcohol/week: 2.0 standard drinks    Types: 1 Glasses of wine, 1 Standard drinks or equivalent per week    Comment: 07/30/2016 "couple drinks/month"  . Drug use: No  . Sexual activity: Not Currently  Other Topics Concern  . Not on file  Social History Narrative  . Not on file   Social Determinants of Health   Financial Resource Strain: Not on file  Food Insecurity: Not on file  Transportation Needs: Not on file  Physical Activity: Not on file  Stress: Not on file  Social Connections: Not on file  Intimate Partner Violence: Not on file     ROS- All systems are reviewed and negative except as per the HPI above.  Physical Exam: Vitals:   05/16/20 1122  BP: 130/84  Pulse: (!) 140  Weight: 124.5 kg  Height: 5\' 6"  (1.676 m)    GEN- The patient is well appearing, alert and oriented x 3 today.   Head- normocephalic, atraumatic Eyes-  Sclera clear, conjunctiva pink Ears- hearing intact Oropharynx- clear Neck- supple  Lungs- Clear to ausculation bilaterally, normal work of breathing Heart-  irregular rate and rhythm, no murmurs, rubs or gallops  GI- soft, NT, ND, + BS Extremities- no clubbing, cyanosis, or edema MS- no significant deformity or atrophy Skin- no rash or lesion  Psych- euthymic mood, full affect Neuro- strength and sensation are intact  Wt Readings from Last 3 Encounters:  05/16/20 124.5 kg  05/09/20 124.4 kg  05/02/20 124.4 kg    EKG today demonstrates  afib at 128 bpm, qrs int 80 ms, qtc 458 ms   Echo 07/08/19 demonstrated  1. Left ventricular ejection fraction, by estimation, is 60 to 65%. The  left ventricle has normal function. The left ventricle has no regional  wall motion abnormalities. Left ventricular diastolic parameters are  consistent with Grade II diastolic  dysfunction (pseudonormalization). Elevated left ventricular end-diastolic  pressure. The average left ventricular global longitudinal strain is -20.5  %.  2. Right ventricular systolic function is normal. The right ventricular  size is normal. Tricuspid regurgitation signal is inadequate for assessing  PA pressure.  3. Left atrial size was moderately dilated.  4. The mitral valve is normal in structure. Mild mitral valve  regurgitation. No evidence of mitral stenosis.  5. The aortic valve is normal in structure. Aortic valve regurgitation is  moderate. No aortic stenosis is present. Aortic regurgitation PHT measures  341 msec.  6. The inferior vena cava is normal in size with greater than 50%  respiratory variability, suggesting right atrial pressure of 3 mmHg.   Comparison(s): 07/21/16 EF 55-60%.   Epic records are reviewed at length today  CHA2DS2-VASc Score = 3  The patient's score is based upon: CHF History: No HTN History: Yes Diabetes History: No Stroke History: No Vascular Disease History: No Age Score: 1 Gender Score: 1    :[id number]    ASSESSMENT AND PLAN: 1. Persistent Atrial Fibrillation (ICD10:  I48.19) The patient's CHA2DS2-VASc score is 3, indicating  a 3.2% annual risk of stroke.    recent cardioversion 1/25 was successful but wnet back into afib 1/29 with RVR She has increased her Toprol to 100 mg bid and is on high dose  of verapamil 180 mg bid   discussed  antiarrythmic's with pt and after discussion with Dr. Loletha Grayer will proceed with flecainide load after a low risk stress test. He will coordinate with his staff to get this scheduled.  2. Secondary Hypercoagulable State (ICD10:  D68.69) The patient is at significant risk for stroke/thromboembolism based upon her CHA2DS2-VASc Score of 3.  Continue Apixaban (Eliquis).  States no missed anticoagulation   3. Obesity Body mass index is 44.29 kg/m.   4. Obstructive sleep apnea Pt is very compliant with CPAP use   5. HTN Stable, no changes today.   Will  await on results  of stress test and if low risk,  then get started on 50 mg flecainide bid , will obtain ekg after 3-5 days and increase to 100 mg bid and get scheduled for cardioversion.     Geroge Baseman Yuritzy Zehring, [facility] [address] [place], [place] [address] [phone]

## 2020-05-16 NOTE — H&P (View-Only) (Signed)
Primary Care Physician: Glenis Smoker, MD Primary Cardiologist: Dr Sallyanne Kuster Primary Electrophysiologist: none Referring Physician: [facility] triage   Leslie [name] is a 75 y.o. female with a history of CKD, HLD, HTN, hypothyroidism, mild-mod MR, mod AI, OSA, and atrial fibrillation who presents for consultation in the [facility].  The patient was initially diagnosed with atrial fibrillation 07/2016 and had DCCV at that time. Beta-blocker dose was decreased in May 2018 when she had junctional rhythm.  After she received her second dose of coronavirus vaccine in February 2021 she developed symptomatic persistent atrial fibrillation with RVR, again challenging to rate control and underwent cardioversion on June 24, 2019. Patient is on Eliquis for a CHADS2VASC score of 3.   She went back  into the afib 04/29/20  with RVR. She called into the answering service and spoke to [name], [name]. He increased her dose of metoprolol to 100 mg in  the pm (usual dose  100 mg  in am and 50 mg in pm) Her v range at home is 90's to 120. She feels she went into afib with worries over a sick cat and the weather. She  does feel fatigue and shortness of breath with activity in afib. She is compliant with CPAP, minimal caffeine, no alcohol, no tobacco. No missed anticoagulation. Has had all vaccines.  F/u successful cardioversion, 05/09/20. unfortunately she is back in afib with RVR. Dicussed antiarrythmic's with pt and with Dr. Loletha Grayer. We discussed use of flecainide as she does not want to use amiodarone with her known thyroid disease. She  will need a stress test which Dr. Loletha Grayer will coordinate and then will get loaded on flecainide with a low risk test and try to get cardioverted in a timely manner. Pt is in agreement. No missed anticoagulation. No conduction issues on ekg's in SR. EF was normal on latest echo.   Today, she denies symptoms of  palpitations, chest pain,+ shortness of  breath/fatigue, no  orthopnea, PND, lower extremity edema, dizziness, presyncope, syncope, snoring, daytime somnolence, bleeding, or neurologic sequela. The patient is tolerating medications without difficulties and is otherwise without complaint today.    Atrial Fibrillation Risk Factors:  she does have symptoms or diagnosis of sleep apnea. she is compliant with CPAP therapy. she does not have a history of rheumatic fever. she does not have a history of alcohol use. The patient does not have a history of early familial atrial fibrillation or other arrhythmias.  she has a BMI of Body mass index is 44.29 kg/m.Marland Kitchen Filed Weights   05/16/20 1122  Weight: 124.5 kg    Family History  Problem Relation Age of Onset  . Heart failure Mother   . Hypertension Mother   . Hypertension Father   . Emphysema Father      Atrial Fibrillation Management history:  Previous antiarrhythmic drugs: none Previous cardioversions: 08/01/2016, 06/24/19 Previous ablations: none CHADS2VASC score:  Anticoagulation history: Eliquis   Past Medical History:  Diagnosis Date  . Adenomatous polyp   . Arthritis    "knees, legs, fingers" (07/30/2016)  . Asthma   . Bursitis of left shoulder    "just finished PT" (07/30/2016)  . Chest pain    a. 2003 Abnl stress test-->Cath: nonobs CAD.  Marland Kitchen Chronic bronchitis ([place])   . Chronic kidney disease    stage 3  . GERD (gastroesophageal reflux disease)    barrett's esophagus- Dr. Cristina Gong  . History of hiatal hernia   . Hyperlipidemia   .  Hypertension   . Hypothyroidism   . Migraines    "sporatic; at least a few/year" (07/30/2016)  . Mitral regurgitation    a. 07/2016 Echo: mild to mod MR;  b. 07/2016 TEE: mild MR.  . Moderate aortic insufficiency    a. 07/2016 Echo: EF 55-60%, mod AI;  b. 07/2016 TEE: EF 50-55%, mod AI.  Marland Kitchen Obesity    s/p lap band surgery 6/10  . OSA on CPAP   . PAF (paroxysmal atrial fibrillation) ([place])    a. 07/2016 TEE/DCCV: EF 50-55%, mild MR,  mod AI, mild to mod TR, neg bubble study-->successful DCCV x1 w/ 120J.  Marland Kitchen PONV (postoperative nausea and vomiting)   . Vitamin D deficiency    Past Surgical History:  Procedure Laterality Date  . APPENDECTOMY     incidental  . CARDIOVERSION N/A 08/01/2016   Procedure: CARDIOVERSION;  Surgeon: Jerline Pain, MD;  Location: [place] [facility];  Service: Cardiovascular;  Laterality: N/A;  . CARDIOVERSION N/A 06/24/2019   Procedure: CARDIOVERSION;  Surgeon: Sanda Klein, MD;  Location: [facility];  Service: Cardiovascular;  Laterality: N/A;  . CARDIOVERSION N/A 05/09/2020   Procedure: CARDIOVERSION;  Surgeon: [name], MD;  Location: [facility];  Service: Cardiovascular;  Laterality: N/A;  . CATARACT EXTRACTION W/ INTRAOCULAR LENS  IMPLANT, BILATERAL Bilateral ~ 2008-2017   left - right  . COMBINED HYSTERECTOMY VAGINAL / OOPHORECTOMY / A&P REPAIR  1981  . HERNIA REPAIR    . INGUINAL HERNIA REPAIR Left 1952  . LAPAROSCOPIC GASTRIC BANDING WITH HIATAL HERNIA REPAIR  09/2008  . TEE WITHOUT CARDIOVERSION N/A 08/01/2016   Procedure: TRANSESOPHAGEAL ECHOCARDIOGRAM (TEE);  Surgeon: Jerline Pain, MD;  Location: [place] [facility];  Service: Cardiovascular;  Laterality: N/A;  . [place]  . VAGINAL HYSTERECTOMY  1981   "w/1 ovary"    Current Outpatient Medications  Medication Sig Dispense Refill  . acetaminophen (TYLENOL) 650 MG CR tablet Take 1,300 mg by mouth every 8 (eight) hours as needed for pain.    Marland Kitchen atorvastatin (LIPITOR) 10 MG tablet Take 10 mg by mouth daily.    . Azelaic Acid (FINACEA EX) Apply 1 application topically daily. Face    . Bacillus Coagulans-Inulin (ALIGN PREBIOTIC-PROBIOTIC PO) Take 2 capsules by mouth daily.    . cholecalciferol (VITAMIN D) 1000 UNITS tablet Take 1,000 Units by mouth daily.    . diclofenac Sodium (VOLTAREN) 1 % GEL Apply 1 application topically 2 (two) times daily.    Marland Kitchen ELIQUIS 5 MG TABS tablet TAKE 1 TABLET(5 MG) BY  MOUTH TWICE DAILY (Patient taking differently: Take 5 mg by mouth 2 (two) times daily.) 180 tablet 1  . fexofenadine (ALLEGRA) 180 MG tablet Take 180 mg by mouth daily.    . fluticasone (FLONASE) 50 MCG/ACT nasal spray Place 2 sprays into both nostrils at bedtime.     . fluticasone (FLOVENT HFA) 44 MCG/ACT inhaler Inhale 2 puffs into the lungs 2 (two) times daily. (Patient taking differently: Inhale 2 puffs into the lungs daily.) 1 Inhaler 0  . furosemide (LASIX) 20 MG tablet Take 20 mg by mouth every morning.    . Glucosamine-Chondroitin (OSTEO BI-FLEX REGULAR STRENGTH PO) Take 1 tablet by mouth daily.    . irbesartan (AVAPRO) 150 MG tablet TAKE 1 TABLET(150 MG) BY MOUTH DAILY (Patient taking differently: Take 150 mg by mouth daily.) 90 tablet 3  . levothyroxine (SYNTHROID, LEVOTHROID) 50 MCG tablet Take 50 mcg by mouth daily before breakfast.    .  MAGNESIUM-OXIDE 400 (241.3 Mg) MG tablet TAKE 1 TABLET BY MOUTH DAILY (Patient taking differently: Take 400 mg by mouth daily.) 90 tablet 3  . metoprolol succinate (TOPROL-XL) 100 MG 24 hr tablet Take 100 mg (one tablet) in the morning and 50 mg (half a tablet) in the evening. (Patient taking differently: Take 100 mg by mouth See admin instructions. Take 100 mg (one tablet) in the morning and 50 mg (half a tablet) in the evening.) 135 tablet 3  . Multiple Vitamins-Minerals (CENTRUM SILVER ADULT 50+ PO) Take 1 tablet by mouth daily. Gummie    . omeprazole (PRILOSEC) 20 MG capsule Take 40 mg by mouth daily before breakfast.    . Psyllium (METAMUCIL) 28.3 % POWD Take 1 Scoop by mouth daily.    Marland Kitchen senna (SENOKOT) 8.6 MG tablet Take 1 tablet by mouth daily.    . verapamil (CALAN-SR) 180 MG CR tablet Take 180 mg by mouth 2 (two) times daily.     No current facility-administered medications for this encounter.    Allergies  Allergen Reactions  . Augmentin [Amoxicillin-Pot Clavulanate] Diarrhea    Did it involve swelling of the face/tongue/throat, SOB, or  low BP? No Did it involve sudden or severe rash/hives, skin peeling, or any reaction on the inside of your mouth or nose? No Did you need to seek medical attention at a hospital or doctor's office? No When did it last happen?10 + years If all above answers are "NO", may proceed with cephalosporin use.   . Codeine Nausea Only and Other (See Comments)    Hyperactivity  . Demerol [Meperidine] Nausea And Vomiting  . Sulfa Antibiotics Rash    Social History   Socioeconomic History  . Marital status: Divorced    Spouse name: Not on file  . Number of children: Not on file  . Years of education: Not on file  . Highest education level: Not on file  Occupational History  . Not on file  Tobacco Use  . Smoking status: Former Research scientist (life sciences)  . Smokeless tobacco: Never Used  . Tobacco comment: 07/30/2016 "social smoker only; in college"  Substance and Sexual Activity  . Alcohol use: Yes    Alcohol/week: 2.0 standard drinks    Types: 1 Glasses of wine, 1 Standard drinks or equivalent per week    Comment: 07/30/2016 "couple drinks/month"  . Drug use: No  . Sexual activity: Not Currently  Other Topics Concern  . Not on file  Social History Narrative  . Not on file   Social Determinants of Health   Financial Resource Strain: Not on file  Food Insecurity: Not on file  Transportation Needs: Not on file  Physical Activity: Not on file  Stress: Not on file  Social Connections: Not on file  Intimate Partner Violence: Not on file     ROS- All systems are reviewed and negative except as per the HPI above.  Physical Exam: Vitals:   05/16/20 1122  BP: 130/84  Pulse: (!) 140  Weight: 124.5 kg  Height: 5\' 6"  (1.676 m)    GEN- The patient is well appearing, alert and oriented x 3 today.   Head- normocephalic, atraumatic Eyes-  Sclera clear, conjunctiva pink Ears- hearing intact Oropharynx- clear Neck- supple  Lungs- Clear to ausculation bilaterally, normal work of breathing Heart-  irregular rate and rhythm, no murmurs, rubs or gallops  GI- soft, NT, ND, + BS Extremities- no clubbing, cyanosis, or edema MS- no significant deformity or atrophy Skin- no rash or lesion  Psych- euthymic mood, full affect Neuro- strength and sensation are intact  Wt Readings from Last 3 Encounters:  05/16/20 124.5 kg  05/09/20 124.4 kg  05/02/20 124.4 kg    EKG today demonstrates  afib at 128 bpm, qrs int 80 ms, qtc 458 ms   Echo 07/08/19 demonstrated  1. Left ventricular ejection fraction, by estimation, is 60 to 65%. The  left ventricle has normal function. The left ventricle has no regional  wall motion abnormalities. Left ventricular diastolic parameters are  consistent with Grade II diastolic  dysfunction (pseudonormalization). Elevated left ventricular end-diastolic  pressure. The average left ventricular global longitudinal strain is -20.5  %.  2. Right ventricular systolic function is normal. The right ventricular  size is normal. Tricuspid regurgitation signal is inadequate for assessing  PA pressure.  3. Left atrial size was moderately dilated.  4. The mitral valve is normal in structure. Mild mitral valve  regurgitation. No evidence of mitral stenosis.  5. The aortic valve is normal in structure. Aortic valve regurgitation is  moderate. No aortic stenosis is present. Aortic regurgitation PHT measures  341 msec.  6. The inferior vena cava is normal in size with greater than 50%  respiratory variability, suggesting right atrial pressure of 3 mmHg.   Comparison(s): 07/21/16 EF 55-60%.   Epic records are reviewed at length today  CHA2DS2-VASc Score = 3  The patient's score is based upon: CHF History: No HTN History: Yes Diabetes History: No Stroke History: No Vascular Disease History: No Age Score: 1 Gender Score: 1    :[id number]    ASSESSMENT AND PLAN: 1. Persistent Atrial Fibrillation (ICD10:  I48.19) The patient's CHA2DS2-VASc score is 3, indicating  a 3.2% annual risk of stroke.    recent cardioversion 1/25 was successful but wnet back into afib 1/29 with RVR She has increased her Toprol to 100 mg bid and is on high dose  of verapamil 180 mg bid   discussed  antiarrythmic's with pt and after discussion with Dr. Loletha Grayer will proceed with flecainide load after a low risk stress test. He will coordinate with his staff to get this scheduled.  2. Secondary Hypercoagulable State (ICD10:  D68.69) The patient is at significant risk for stroke/thromboembolism based upon her CHA2DS2-VASc Score of 3.  Continue Apixaban (Eliquis).  States no missed anticoagulation   3. Obesity Body mass index is 44.29 kg/m.   4. Obstructive sleep apnea Pt is very compliant with CPAP use   5. HTN Stable, no changes today.   Will  await on results  of stress test and if low risk,  then get started on 50 mg flecainide bid , will obtain ekg after 3-5 days and increase to 100 mg bid and get scheduled for cardioversion.     Geroge Baseman Aireona Torelli, [facility] [address] [place], [place] [address] [phone]

## 2020-05-17 ENCOUNTER — Telehealth: Payer: Self-pay | Admitting: Cardiovascular Disease

## 2020-05-17 ENCOUNTER — Other Ambulatory Visit: Payer: Self-pay | Admitting: *Deleted

## 2020-05-17 DIAGNOSIS — I25119 Atherosclerotic heart disease of native coronary artery with unspecified angina pectoris: Secondary | ICD-10-CM

## 2020-05-17 DIAGNOSIS — I4819 Other persistent atrial fibrillation: Secondary | ICD-10-CM

## 2020-05-17 NOTE — Telephone Encounter (Signed)
That's ok.

## 2020-05-17 NOTE — Telephone Encounter (Signed)
Spoke with patient who states she had a root canal today at the Dentist. Patient states that the Dentist reported a lot of infection in the tooth. Per patient she was started on Clindamycin for the infection. Patient states she wanted to make sure Dr. Sallyanne Kuster was aware.  Patient also states that her Lexiscan 1st and 2nd part are scheduled for 2/9 & 2/10. Advised patient I would forward message to Dr. Sallyanne Kuster. Patient verbalized understanding.

## 2020-05-17 NOTE — Telephone Encounter (Signed)
Pt c/o medication issue:  1. Name of Medication: Clindamycin 150 mg  2. How are you currently taking this medication (dosage and times per day)? 1 tablet 3x daily   3. Are you having a reaction (difficulty breathing--STAT)? No   4. What is your medication issue? Carlyon Prows is calling stating she had a root canal performed today and there was a lot of infection. Her dentist advised her they believe they got it all, but they have put her on this antibiotic. She states she was just calling to inform our office. Please advise.

## 2020-05-18 DIAGNOSIS — G4733 Obstructive sleep apnea (adult) (pediatric): Secondary | ICD-10-CM | POA: Diagnosis not present

## 2020-05-18 NOTE — Telephone Encounter (Signed)
The patient has been made aware and verbalized her understanding.  

## 2020-05-19 ENCOUNTER — Telehealth: Payer: Self-pay | Admitting: Cardiovascular Disease

## 2020-05-19 ENCOUNTER — Encounter: Payer: Self-pay | Admitting: *Deleted

## 2020-05-19 NOTE — Telephone Encounter (Signed)
New message:    Pt said she started taking antibiotic on Tuesday r dental work. .  Pt c/o medication issue:  1. Name of Medication:  Clindamycin  2. How are you currently taking this medication (dosage and times per day)? 3 times a dday since Tuesday, but have not taken it today  3. Are you having a reaction (difficulty breathing--STAT)? Pt was in Afib before dental work and still is in it  4. What is your medication issue? Woke up this morning and  Had these issues, Lip swollen, face puffy and diarrhea- she took some Allegra and it seems to be doing better    Patient c/o Palpitations:  High priority if patient c/o lightheadedness, shortness of breath, or chest pain  1) How long have you had palpitations/irregular HR/ Afib? Are you having the symptoms now?  Afib since 05-13-20  2) Are you currently experiencing lightheadedness, SOB or CP? Shortness of breath off and on*  3) Do you have a history of afib (atrial fibrillation) or irregular heart rhythm? yes  4) Have you checked your BP or HR? (document readings if available): 138/96 and heart rate was 101 today  5) Are you experiencing any other symptoms?  no

## 2020-05-19 NOTE — Telephone Encounter (Signed)
Spoke with pt, she woke with lip and face swelling that she feels is related to the antibiotic. She has not taken it today. She will contact the orthodontic office to see if they can give her something different. Clindamycin added to her allergy list. She has taken all of her medications this morning and is waiting for the stress test next week to get cleared for flecainide. She will continue to monitor heart rate.-

## 2020-05-23 ENCOUNTER — Telehealth (HOSPITAL_COMMUNITY): Payer: Self-pay | Admitting: *Deleted

## 2020-05-23 NOTE — Telephone Encounter (Signed)
Close encounter 

## 2020-05-24 ENCOUNTER — Ambulatory Visit (HOSPITAL_COMMUNITY)
Admission: RE | Admit: 2020-05-24 | Discharge: 2020-05-24 | Disposition: A | Discharge status: Home / Self Care | Payer: PPO | Source: Ambulatory Visit | Attending: Cardiovascular Disease | Admitting: Cardiovascular Disease

## 2020-05-24 ENCOUNTER — Other Ambulatory Visit: Payer: Self-pay

## 2020-05-24 DIAGNOSIS — I4819 Other persistent atrial fibrillation: Secondary | ICD-10-CM | POA: Diagnosis not present

## 2020-05-24 MED ORDER — AMINOPHYLLINE 25 MG/ML IV SOLN
75.0000 mg | Freq: Once | INTRAVENOUS | Status: AC
  Administered 2020-05-24: 75 mg via INTRAVENOUS

## 2020-05-24 MED ORDER — TECHNETIUM TC 99M TETROFOSMIN IV KIT
30.2000 | PACK | Freq: Once | INTRAVENOUS | Status: AC | PRN
  Administered 2020-05-24: 30.2 via INTRAVENOUS
  Filled 2020-05-24: qty 31

## 2020-05-24 MED ORDER — REGADENOSON 0.4 MG/5ML IV SOLN
0.4000 mg | Freq: Once | INTRAVENOUS | Status: AC
  Administered 2020-05-24: 0.4 mg via INTRAVENOUS

## 2020-05-25 ENCOUNTER — Telehealth: Payer: Self-pay | Admitting: *Deleted

## 2020-05-25 ENCOUNTER — Ambulatory Visit (HOSPITAL_COMMUNITY)
Admission: RE | Admit: 2020-05-25 | Discharge: 2020-05-25 | Disposition: A | Discharge status: Home / Self Care | Payer: PPO | Source: Ambulatory Visit | Attending: Cardiovascular Disease | Admitting: Cardiovascular Disease

## 2020-05-25 DIAGNOSIS — I4819 Other persistent atrial fibrillation: Secondary | ICD-10-CM | POA: Diagnosis not present

## 2020-05-25 DIAGNOSIS — I25119 Atherosclerotic heart disease of native coronary artery with unspecified angina pectoris: Secondary | ICD-10-CM

## 2020-05-25 LAB — CBC
Hematocrit: 34 % (ref 34.0–46.6)
Hemoglobin: 10.4 g/dL — ABNORMAL LOW (ref 11.1–15.9)
MCH: 21.9 pg — ABNORMAL LOW (ref 26.6–33.0)
MCHC: 30.6 g/dL — ABNORMAL LOW (ref 31.5–35.7)
MCV: 72 fL — ABNORMAL LOW (ref 79–97)
Platelets: 396 10*3/uL (ref 150–450)
RBC: 4.74 x10E6/uL (ref 3.77–5.28)
RDW: 16.5 % — ABNORMAL HIGH (ref 11.7–15.4)
WBC: 12 10*3/uL — ABNORMAL HIGH (ref 3.4–10.8)

## 2020-05-25 LAB — BASIC METABOLIC PANEL
BUN/Creatinine Ratio: 15 (ref 12–28)
BUN: 17 mg/dL (ref 8–27)
CO2: 21 mmol/L (ref 20–29)
Calcium: 8.7 mg/dL (ref 8.7–10.3)
Chloride: 101 mmol/L (ref 96–106)
Creatinine, Ser: 1.13 mg/dL — ABNORMAL HIGH (ref 0.57–1.00)
GFR calc Af Amer: 55 mL/min/{1.73_m2} — ABNORMAL LOW (ref >59)
GFR calc non Af Amer: 48 mL/min/{1.73_m2} — ABNORMAL LOW (ref >59)
Glucose: 102 mg/dL — ABNORMAL HIGH (ref 65–99)
Potassium: 4.8 mmol/L (ref 3.5–5.2)
Sodium: 138 mmol/L (ref 134–144)

## 2020-05-25 LAB — MYOCARDIAL PERFUSION IMAGING
Peak HR: 115 {beats}/min
Rest HR: 101 {beats}/min
SDS: 2
SRS: 2
SSS: 4
TID: 0.87

## 2020-05-25 MED ORDER — TECHNETIUM TC 99M TETROFOSMIN IV KIT
30.9000 | PACK | Freq: Once | INTRAVENOUS | Status: AC | PRN
  Administered 2020-05-25: 30.9 via INTRAVENOUS

## 2020-05-25 MED ORDER — FLECAINIDE ACETATE 50 MG PO TABS: 50.0000 mg | ORAL_TABLET | Freq: Two times a day (BID) | ORAL | 3 refills | Status: DC

## 2020-05-25 NOTE — Telephone Encounter (Signed)
The patient came in for a stress test today and was found to still be in afib. Per Dr. Sallyanne Kuster, the patient should be set up for a cardioversion next week and start Flecainide 50 mg twice daily.   You are scheduled for a Cardioversion on 05/30/20 with Dr. Sallyanne Kuster.  Please arrive at the [facility] (Main Entrance A) at [facility]: [place], [place] [address] at 8 am. (1 hour prior to procedure)  DIET: Nothing to eat or drink after midnight except a sip of water with medications (see medication instructions below)  Medication Instructions: Hold Furosemide the morning of the procedure Hold all diabetic medication the morning of the procedure  Continue your anticoagulant: Eliquis. If you miss a dose, please call the office immediately at [phone] You will need to continue your anticoagulant after your procedure until you are told by your provider that it is safe to stop   Labs:  You will need to have the coronavirus test completed prior to your procedure. An appointment has been made at 1:55 pm on 05/26/20. This is a Drive Up Visit at [address], [place], [place] [address]. Please tell them that you are there for procedure testing. Stay in your car and someone will be with you shortly. Please make sure to have all other labs completed before this test because you will need to stay quarantined until your procedure.   You must have a responsible person to drive you home and stay in the waiting area during your procedure. Failure to do so could result in cancellation.  Bring your insurance cards.  *Special Note: Every effort is made to have your procedure done on time. Occasionally there are emergencies that occur at the hospital that may cause delays. Please be patient if a delay does occur.

## 2020-05-26 ENCOUNTER — Other Ambulatory Visit (HOSPITAL_COMMUNITY)
Admission: RE | Admit: 2020-05-26 | Discharge: 2020-05-26 | Disposition: A | Discharge status: Home / Self Care | Payer: PPO | Source: Ambulatory Visit | Attending: Cardiovascular Disease | Admitting: Cardiovascular Disease

## 2020-05-26 DIAGNOSIS — Z01812 Encounter for preprocedural laboratory examination: Secondary | ICD-10-CM | POA: Diagnosis not present

## 2020-05-26 DIAGNOSIS — Z20822 Contact with and (suspected) exposure to covid-19: Secondary | ICD-10-CM | POA: Diagnosis not present

## 2020-05-26 LAB — SARS CORONAVIRUS 2 (TAT 6-24 HRS): SARS Coronavirus 2: NEGATIVE

## 2020-05-29 ENCOUNTER — Other Ambulatory Visit: Payer: Self-pay | Admitting: *Deleted

## 2020-05-30 ENCOUNTER — Telehealth: Payer: Self-pay | Admitting: Cardiovascular Disease

## 2020-05-30 ENCOUNTER — Encounter (HOSPITAL_COMMUNITY): Payer: Self-pay | Admitting: Cardiovascular Disease

## 2020-05-30 ENCOUNTER — Ambulatory Visit (HOSPITAL_BASED_OUTPATIENT_CLINIC_OR_DEPARTMENT_OTHER)
Admission: RE | Admit: 2020-05-30 | Discharge: 2020-05-30 | Disposition: A | Discharge status: Home / Self Care | Payer: PPO | Source: Home / Self Care | Attending: Cardiovascular Disease | Admitting: Cardiovascular Disease

## 2020-05-30 ENCOUNTER — Other Ambulatory Visit: Payer: Self-pay

## 2020-05-30 ENCOUNTER — Encounter (HOSPITAL_COMMUNITY): Payer: Self-pay | Admitting: Internal Medicine

## 2020-05-30 ENCOUNTER — Ambulatory Visit (HOSPITAL_COMMUNITY): Payer: PPO | Admitting: Anesthesiology

## 2020-05-30 ENCOUNTER — Emergency Department (HOSPITAL_COMMUNITY): Payer: PPO

## 2020-05-30 ENCOUNTER — Inpatient Hospital Stay (HOSPITAL_COMMUNITY)
Admission: EM | Admit: 2020-05-30 | Discharge: 2020-06-03 | DRG: 291 | Disposition: A | Discharge status: Home / Self Care | Payer: PPO | Attending: Internal Medicine | Admitting: Internal Medicine

## 2020-05-30 ENCOUNTER — Encounter (HOSPITAL_COMMUNITY)
Admission: RE | Disposition: A | Discharge status: Home / Self Care | Payer: Self-pay | Source: Home / Self Care | Attending: Cardiovascular Disease

## 2020-05-30 DIAGNOSIS — Z882 Allergy status to sulfonamides status: Secondary | ICD-10-CM | POA: Diagnosis not present

## 2020-05-30 DIAGNOSIS — D72828 Other elevated white blood cell count: Secondary | ICD-10-CM | POA: Diagnosis present

## 2020-05-30 DIAGNOSIS — J9601 Acute respiratory failure with hypoxia: Secondary | ICD-10-CM

## 2020-05-30 DIAGNOSIS — I10 Essential (primary) hypertension: Secondary | ICD-10-CM | POA: Insufficient documentation

## 2020-05-30 DIAGNOSIS — I5033 Acute on chronic diastolic (congestive) heart failure: Secondary | ICD-10-CM | POA: Diagnosis present

## 2020-05-30 DIAGNOSIS — Z7901 Long term (current) use of anticoagulants: Secondary | ICD-10-CM

## 2020-05-30 DIAGNOSIS — Z79890 Hormone replacement therapy (postmenopausal): Secondary | ICD-10-CM

## 2020-05-30 DIAGNOSIS — N183 Chronic kidney disease, stage 3 unspecified: Secondary | ICD-10-CM | POA: Diagnosis not present

## 2020-05-30 DIAGNOSIS — J810 Acute pulmonary edema: Secondary | ICD-10-CM | POA: Diagnosis present

## 2020-05-30 DIAGNOSIS — E669 Obesity, unspecified: Secondary | ICD-10-CM | POA: Insufficient documentation

## 2020-05-30 DIAGNOSIS — Z881 Allergy status to other antibiotic agents status: Secondary | ICD-10-CM | POA: Diagnosis not present

## 2020-05-30 DIAGNOSIS — D509 Iron deficiency anemia, unspecified: Secondary | ICD-10-CM | POA: Diagnosis present

## 2020-05-30 DIAGNOSIS — I4891 Unspecified atrial fibrillation: Secondary | ICD-10-CM | POA: Diagnosis not present

## 2020-05-30 DIAGNOSIS — E7800 Pure hypercholesterolemia, unspecified: Secondary | ICD-10-CM | POA: Diagnosis not present

## 2020-05-30 DIAGNOSIS — Z79899 Other long term (current) drug therapy: Secondary | ICD-10-CM | POA: Diagnosis not present

## 2020-05-30 DIAGNOSIS — Z20822 Contact with and (suspected) exposure to covid-19: Secondary | ICD-10-CM | POA: Diagnosis present

## 2020-05-30 DIAGNOSIS — R0602 Shortness of breath: Secondary | ICD-10-CM | POA: Diagnosis not present

## 2020-05-30 DIAGNOSIS — I130 Hypertensive heart and chronic kidney disease with heart failure and stage 1 through stage 4 chronic kidney disease, or unspecified chronic kidney disease: Secondary | ICD-10-CM | POA: Diagnosis not present

## 2020-05-30 DIAGNOSIS — E039 Hypothyroidism, unspecified: Secondary | ICD-10-CM | POA: Diagnosis not present

## 2020-05-30 DIAGNOSIS — R0902 Hypoxemia: Secondary | ICD-10-CM | POA: Diagnosis not present

## 2020-05-30 DIAGNOSIS — Z6841 Body Mass Index (BMI) 40.0 and over, adult: Secondary | ICD-10-CM | POA: Diagnosis not present

## 2020-05-30 DIAGNOSIS — Z880 Allergy status to penicillin: Secondary | ICD-10-CM

## 2020-05-30 DIAGNOSIS — D631 Anemia in chronic kidney disease: Secondary | ICD-10-CM | POA: Diagnosis present

## 2020-05-30 DIAGNOSIS — Z9049 Acquired absence of other specified parts of digestive tract: Secondary | ICD-10-CM | POA: Diagnosis not present

## 2020-05-30 DIAGNOSIS — Z87891 Personal history of nicotine dependence: Secondary | ICD-10-CM | POA: Insufficient documentation

## 2020-05-30 DIAGNOSIS — G4733 Obstructive sleep apnea (adult) (pediatric): Secondary | ICD-10-CM | POA: Diagnosis present

## 2020-05-30 DIAGNOSIS — I509 Heart failure, unspecified: Secondary | ICD-10-CM

## 2020-05-30 DIAGNOSIS — I4819 Other persistent atrial fibrillation: Secondary | ICD-10-CM | POA: Diagnosis not present

## 2020-05-30 DIAGNOSIS — E559 Vitamin D deficiency, unspecified: Secondary | ICD-10-CM | POA: Diagnosis not present

## 2020-05-30 DIAGNOSIS — I5031 Acute diastolic (congestive) heart failure: Secondary | ICD-10-CM | POA: Diagnosis not present

## 2020-05-30 DIAGNOSIS — I110 Hypertensive heart disease with heart failure: Secondary | ICD-10-CM | POA: Diagnosis not present

## 2020-05-30 DIAGNOSIS — I2510 Atherosclerotic heart disease of native coronary artery without angina pectoris: Secondary | ICD-10-CM | POA: Diagnosis present

## 2020-05-30 DIAGNOSIS — D6869 Other thrombophilia: Secondary | ICD-10-CM | POA: Insufficient documentation

## 2020-05-30 DIAGNOSIS — R062 Wheezing: Secondary | ICD-10-CM | POA: Diagnosis not present

## 2020-05-30 DIAGNOSIS — R000 Tachycardia, unspecified: Secondary | ICD-10-CM | POA: Diagnosis not present

## 2020-05-30 DIAGNOSIS — K219 Gastro-esophageal reflux disease without esophagitis: Secondary | ICD-10-CM | POA: Diagnosis present

## 2020-05-30 DIAGNOSIS — E785 Hyperlipidemia, unspecified: Secondary | ICD-10-CM | POA: Diagnosis not present

## 2020-05-30 DIAGNOSIS — Z885 Allergy status to narcotic agent status: Secondary | ICD-10-CM | POA: Insufficient documentation

## 2020-05-30 DIAGNOSIS — Z7951 Long term (current) use of inhaled steroids: Secondary | ICD-10-CM

## 2020-05-30 DIAGNOSIS — N1831 Chronic kidney disease, stage 3a: Secondary | ICD-10-CM | POA: Diagnosis not present

## 2020-05-30 DIAGNOSIS — I5021 Acute systolic (congestive) heart failure: Secondary | ICD-10-CM | POA: Diagnosis not present

## 2020-05-30 HISTORY — PX: CARDIOVERSION: SHX1299

## 2020-05-30 LAB — CBC WITH DIFFERENTIAL/PLATELET
Abs Immature Granulocytes: 0.11 10*3/uL — ABNORMAL HIGH (ref 0.00–0.07)
Basophils Absolute: 0.1 10*3/uL (ref 0.0–0.1)
Basophils Relative: 1 %
Eosinophils Absolute: 0.1 10*3/uL (ref 0.0–0.5)
Eosinophils Relative: 1 %
HCT: 37.7 % (ref 36.0–46.0)
Hemoglobin: 10.7 g/dL — ABNORMAL LOW (ref 12.0–15.0)
Immature Granulocytes: 1 %
Lymphocytes Relative: 8 %
Lymphs Abs: 1.4 10*3/uL (ref 0.7–4.0)
MCH: 21.6 pg — ABNORMAL LOW (ref 26.0–34.0)
MCHC: 28.4 g/dL — ABNORMAL LOW (ref 30.0–36.0)
MCV: 76.2 fL — ABNORMAL LOW (ref 80.0–100.0)
Monocytes Absolute: 0.9 10*3/uL (ref 0.1–1.0)
Monocytes Relative: 5 %
Neutro Abs: 14.9 10*3/uL — ABNORMAL HIGH (ref 1.7–7.7)
Neutrophils Relative %: 84 %
Platelets: 382 10*3/uL (ref 150–400)
RBC: 4.95 MIL/uL (ref 3.87–5.11)
RDW: 17.1 % — ABNORMAL HIGH (ref 11.5–15.5)
WBC: 17.5 10*3/uL — ABNORMAL HIGH (ref 4.0–10.5)
nRBC: 0 % (ref 0.0–0.2)

## 2020-05-30 LAB — BASIC METABOLIC PANEL
Anion gap: 14 (ref 5–15)
BUN: 18 mg/dL (ref 8–23)
CO2: 21 mmol/L — ABNORMAL LOW (ref 22–32)
Calcium: 8.3 mg/dL — ABNORMAL LOW (ref 8.9–10.3)
Chloride: 104 mmol/L (ref 98–111)
Creatinine, Ser: 1.11 mg/dL — ABNORMAL HIGH (ref 0.44–1.00)
GFR, Estimated: 52 mL/min — ABNORMAL LOW (ref >60)
Glucose, Bld: 117 mg/dL — ABNORMAL HIGH (ref 70–99)
Potassium: 4.1 mmol/L (ref 3.5–5.1)
Sodium: 139 mmol/L (ref 135–145)

## 2020-05-30 LAB — BRAIN NATRIURETIC PEPTIDE: B Natriuretic Peptide: 306.6 pg/mL — ABNORMAL HIGH (ref 0.0–100.0)

## 2020-05-30 SURGERY — CARDIOVERSION
Anesthesia: General

## 2020-05-30 MED ORDER — EPINEPHRINE 0.3 MG/0.3ML IJ SOAJ
0.3000 mg | Freq: Once | INTRAMUSCULAR | Status: DC
  Filled 2020-05-30: qty 0.3

## 2020-05-30 MED ORDER — ALBUTEROL (5 MG/ML) CONTINUOUS INHALATION SOLN
10.0000 mg | INHALATION_SOLUTION | RESPIRATORY_TRACT | Status: DC
  Administered 2020-05-30: 10 mg via RESPIRATORY_TRACT
  Filled 2020-05-30 (×2): qty 20

## 2020-05-30 MED ORDER — METOPROLOL SUCCINATE ER 100 MG PO TB24
100.0000 mg | ORAL_TABLET | Freq: Every day | ORAL | Status: DC
  Administered 2020-05-31 – 2020-06-03 (×4): 100 mg via ORAL
  Filled 2020-05-30 (×4): qty 1

## 2020-05-30 MED ORDER — FUROSEMIDE 10 MG/ML IJ SOLN
20.0000 mg | Freq: Two times a day (BID) | INTRAMUSCULAR | Status: DC
  Administered 2020-05-31: 20 mg via INTRAVENOUS
  Filled 2020-05-30: qty 2

## 2020-05-30 MED ORDER — METHYLPREDNISOLONE SODIUM SUCC 125 MG IJ SOLR
125.0000 mg | Freq: Once | INTRAMUSCULAR | Status: DC
  Filled 2020-05-30: qty 2

## 2020-05-30 MED ORDER — METOPROLOL SUCCINATE ER 50 MG PO TB24
50.0000 mg | ORAL_TABLET | Freq: Every day | ORAL | Status: DC
  Administered 2020-05-31 – 2020-06-02 (×3): 50 mg via ORAL
  Filled 2020-05-30 (×4): qty 1

## 2020-05-30 MED ORDER — LEVOTHYROXINE SODIUM 50 MCG PO TABS
50.0000 ug | ORAL_TABLET | Freq: Every day | ORAL | Status: DC
  Administered 2020-05-31 – 2020-06-03 (×4): 50 ug via ORAL
  Filled 2020-05-30 (×5): qty 1

## 2020-05-30 MED ORDER — LIDOCAINE 2% (20 MG/ML) 5 ML SYRINGE
INTRAMUSCULAR | Status: DC | PRN
  Administered 2020-05-30: 80 mg via INTRAVENOUS

## 2020-05-30 MED ORDER — ACETAMINOPHEN 650 MG RE SUPP: 650.0000 mg | Freq: Four times a day (QID) | RECTAL | Status: DC | PRN

## 2020-05-30 MED ORDER — SODIUM CHLORIDE 0.9 % IV SOLN
INTRAVENOUS | Status: AC | PRN
  Administered 2020-05-30: 500 mL via INTRAVENOUS

## 2020-05-30 MED ORDER — APIXABAN 5 MG PO TABS
5.0000 mg | ORAL_TABLET | Freq: Two times a day (BID) | ORAL | Status: DC
  Administered 2020-05-31 – 2020-06-03 (×7): 5 mg via ORAL
  Filled 2020-05-30 (×7): qty 1

## 2020-05-30 MED ORDER — FUROSEMIDE 10 MG/ML IJ SOLN
40.0000 mg | INTRAMUSCULAR | Status: AC
  Administered 2020-05-30: 40 mg via INTRAVENOUS
  Filled 2020-05-30: qty 4

## 2020-05-30 MED ORDER — ACETAMINOPHEN 325 MG PO TABS
650.0000 mg | ORAL_TABLET | Freq: Four times a day (QID) | ORAL | Status: DC | PRN
  Administered 2020-05-31: 650 mg via ORAL
  Filled 2020-05-30: qty 2

## 2020-05-30 MED ORDER — ATORVASTATIN CALCIUM 10 MG PO TABS
10.0000 mg | ORAL_TABLET | Freq: Every day | ORAL | Status: DC
  Administered 2020-05-31 – 2020-06-03 (×4): 10 mg via ORAL
  Filled 2020-05-30 (×4): qty 1

## 2020-05-30 MED ORDER — IRBESARTAN 150 MG PO TABS
150.0000 mg | ORAL_TABLET | Freq: Every day | ORAL | Status: DC
  Administered 2020-05-31 – 2020-06-03 (×4): 150 mg via ORAL
  Filled 2020-05-30 (×4): qty 1

## 2020-05-30 MED ORDER — MAGNESIUM OXIDE 400 (241.3 MG) MG PO TABS
400.0000 mg | ORAL_TABLET | Freq: Every day | ORAL | Status: DC
Start: 2020-05-31 — End: 2020-06-03
  Administered 2020-05-31 – 2020-06-03 (×4): 400 mg via ORAL
  Filled 2020-05-30 (×4): qty 1

## 2020-05-30 MED ORDER — PROPOFOL 10 MG/ML IV BOLUS
INTRAVENOUS | Status: DC | PRN
  Administered 2020-05-30: 50 mg via INTRAVENOUS

## 2020-05-30 MED ORDER — METOPROLOL SUCCINATE ER 100 MG PO TB24: 100.0000 mg | ORAL_TABLET | ORAL | Status: DC

## 2020-05-30 MED ORDER — VERAPAMIL HCL ER 180 MG PO TBCR
180.0000 mg | EXTENDED_RELEASE_TABLET | Freq: Two times a day (BID) | ORAL | Status: DC
  Administered 2020-05-31 – 2020-06-03 (×7): 180 mg via ORAL
  Filled 2020-05-30 (×8): qty 1

## 2020-05-30 MED ORDER — FLECAINIDE ACETATE 50 MG PO TABS
50.0000 mg | ORAL_TABLET | Freq: Two times a day (BID) | ORAL | Status: DC
  Administered 2020-05-31 – 2020-06-03 (×7): 50 mg via ORAL
  Filled 2020-05-30 (×8): qty 1

## 2020-05-30 NOTE — ED Notes (Signed)
Pt placed with purewick. Vss on monitor, reports decreasing s/s at this time. Pt talking in full sentences without difficulty, updating family.

## 2020-05-30 NOTE — H&P (Signed)
History and Physical    Leslie [name] HUT:[id number] DOB: 06-Aug-1945 DOA: 05/30/2020  PCP: Glenis Smoker, MD  Patient coming from: Home.  Chief Complaint: Shortness of breath.  HPI: Leslie [name] is a 75 y.o. female with history of paroxysmal atrial fibrillation status post cardioversion this morning with history of hypertension hypothyroidism hyperlipidemia presents to the ER after patient became more short of breath towards the evening. Patient states even prior to the cardioversion patient has been mildly short of breath. Has been taking diuretics at home. Patient has been cardioverted for the second time in the last 3 weeks. Has been compliant with Eliquis. Patient states towards the evening patient became more acutely short of breath even at rest but no chest pain or productive cough.  ED Course: In the ER chest x-ray shows features concerning for fluid overload and BNP was around 300. WBC count was 17.5. Patient was given Lasix 40 mg IV and admitted for acute CHF. Covid test was negative. Hemoglobin is at baseline.  Review of Systems: As per HPI, rest all negative.   Past Medical History:  Diagnosis Date  . Adenomatous polyp   . Arthritis    "knees, legs, fingers" (07/30/2016)  . Asthma   . Bursitis of left shoulder    "just finished PT" (07/30/2016)  . Chest pain    a. 2003 Abnl stress test-->Cath: nonobs CAD.  Marland Kitchen Chronic bronchitis ([place])   . Chronic kidney disease    stage 3  . GERD (gastroesophageal reflux disease)    barrett's esophagus- Dr. Cristina Gong  . History of hiatal hernia   . Hyperlipidemia   . Hypertension   . Hypothyroidism   . Migraines    "sporatic; at least a few/year" (07/30/2016)  . Mitral regurgitation    a. 07/2016 Echo: mild to mod MR;  b. 07/2016 TEE: mild MR.  . Moderate aortic insufficiency    a. 07/2016 Echo: EF 55-60%, mod AI;  b. 07/2016 TEE: EF 50-55%, mod AI.  Marland Kitchen Obesity    s/p lap band surgery 6/10  . OSA on CPAP   . PAF (paroxysmal  atrial fibrillation) ([place])    a. 07/2016 TEE/DCCV: EF 50-55%, mild MR, mod AI, mild to mod TR, neg bubble study-->successful DCCV x1 w/ 120J.  Marland Kitchen PONV (postoperative nausea and vomiting)   . Vitamin D deficiency     Past Surgical History:  Procedure Laterality Date  . APPENDECTOMY     incidental  . CARDIOVERSION N/A 08/01/2016   Procedure: CARDIOVERSION;  Surgeon: Jerline Pain, MD;  Location: [facility];  Service: Cardiovascular;  Laterality: N/A;  . CARDIOVERSION N/A 06/24/2019   Procedure: CARDIOVERSION;  Surgeon: Sanda Klein, MD;  Location: [facility];  Service: Cardiovascular;  Laterality: N/A;  . CARDIOVERSION N/A 05/09/2020   Procedure: CARDIOVERSION;  Surgeon: [name], MD;  Location: [facility];  Service: Cardiovascular;  Laterality: N/A;  . CATARACT EXTRACTION W/ INTRAOCULAR LENS  IMPLANT, BILATERAL Bilateral ~ 2008-2017   left - right  . COMBINED HYSTERECTOMY VAGINAL / OOPHORECTOMY / A&P REPAIR  1981  . HERNIA REPAIR    . INGUINAL HERNIA REPAIR Left 1952  . LAPAROSCOPIC GASTRIC BANDING WITH HIATAL HERNIA REPAIR  09/2008  . TEE WITHOUT CARDIOVERSION N/A 08/01/2016   Procedure: TRANSESOPHAGEAL ECHOCARDIOGRAM (TEE);  Surgeon: Jerline Pain, MD;  Location: [facility];  Service: Cardiovascular;  Laterality: N/A;  . [place]  . VAGINAL HYSTERECTOMY  1981   "w/1 ovary"  reports that she has quit smoking. She has never used smokeless tobacco. She reports current alcohol use of about 2.0 standard drinks of alcohol per week. She reports that she does not use drugs.  Allergies  Allergen Reactions  . Clindamycin Hcl     Lip and face swelling  . Augmentin [Amoxicillin-Pot Clavulanate] Diarrhea    Did it involve swelling of the face/tongue/throat, SOB, or low BP? No Did it involve sudden or severe rash/hives, skin peeling, or any reaction on the inside of your mouth or nose? No Did you need to seek medical attention at a hospital  or doctor's office? No When did it last happen?10 + years If all above answers are "NO", may proceed with cephalosporin use.   . Codeine Nausea Only and Other (See Comments)    Hyperactivity  . Demerol [Meperidine] Nausea And Vomiting  . Sulfa Antibiotics Rash    Family History  Problem Relation Age of Onset  . Heart failure Mother   . Hypertension Mother   . Hypertension Father   . Emphysema Father     Prior to Admission medications   Medication Sig Start Date End Date Taking? Authorizing Provider  acetaminophen (TYLENOL) 650 MG CR tablet Take 1,300 mg by mouth every 8 (eight) hours as needed for pain.    [provider]  atorvastatin (LIPITOR) 10 MG tablet Take 10 mg by mouth daily.    [provider]  Bacillus Coagulans-Inulin (ALIGN PREBIOTIC-PROBIOTIC PO) Take 2 capsules by mouth daily. Gummy    [provider]  cholecalciferol (VITAMIN D) 1000 UNITS tablet Take 1,000 Units by mouth daily.    [provider]  diclofenac Sodium (VOLTAREN) 1 % GEL Apply 1 application topically 2 (two) times daily.    [provider]  ELIQUIS 5 MG TABS tablet TAKE 1 TABLET(5 MG) BY MOUTH TWICE DAILY Patient taking differently: Take 5 mg by mouth 2 (two) times daily. 03/24/20   Croitoru, Mihai, MD  fexofenadine (ALLEGRA) 180 MG tablet Take 180 mg by mouth daily.    [provider]  flecainide (TAMBOCOR) 50 MG tablet Take 1 tablet (50 mg total) by mouth 2 (two) times daily. 05/25/20   Croitoru, Mihai, MD  fluticasone (FLONASE) 50 MCG/ACT nasal spray Place 2 sprays into both nostrils at bedtime.     [provider]  fluticasone (FLOVENT HFA) 44 MCG/ACT inhaler Inhale 2 puffs into the lungs 2 (two) times daily. Patient taking differently: Inhale 2 puffs into the lungs every evening. 03/04/18   Croitoru, Mihai, MD  furosemide (LASIX) 20 MG tablet Take 20 mg by mouth daily. 05/20/16   [provider]  Glucosamine-Chondroitin  (OSTEO BI-FLEX REGULAR STRENGTH PO) Take 1 tablet by mouth daily.    [provider]  irbesartan (AVAPRO) 150 MG tablet TAKE 1 TABLET(150 MG) BY MOUTH DAILY Patient taking differently: Take 150 mg by mouth daily. 02/01/20   Croitoru, Mihai, MD  levothyroxine (SYNTHROID, LEVOTHROID) 50 MCG tablet Take 50 mcg by mouth daily before breakfast.    [provider]  MAGNESIUM-OXIDE 400 (241.3 Mg) MG tablet TAKE 1 TABLET BY MOUTH DAILY Patient taking differently: Take 400 mg by mouth daily. 01/10/20   Croitoru, Mihai, MD  metoprolol succinate (TOPROL-XL) 100 MG 24 hr tablet Take 100 mg (one tablet) in the morning and 50 mg (half a tablet) in the evening. Patient taking differently: Take 100 mg by mouth See admin instructions. Take 100 mg (one tablet) in the morning and 50 mg (half a  tablet) in the evening. 07/28/19   Croitoru, Mihai, MD  metroNIDAZOLE (METROCREAM) 0.75 % cream Apply 1 application topically daily. 05/16/20   [provider]  mometasone (ELOCON) 0.1 % cream Apply 1 application topically 2 (two) times daily as needed (skin irritation). 05/18/20   [provider]  Multiple Vitamins-Minerals (CENTRUM MULTIGUMMIES) CHEW Chew 1 tablet by mouth daily.    [provider]  omeprazole (PRILOSEC) 20 MG capsule Take 40 mg by mouth daily before breakfast. 07/27/16   [provider]  Psyllium (METAMUCIL) 28.3 % POWD Take 1 Scoop by mouth daily.    [provider]  senna (SENOKOT) 8.6 MG tablet Take 1 tablet by mouth every evening.    [provider]  verapamil (CALAN-SR) 180 MG CR tablet Take 180 mg by mouth 2 (two) times daily.    [provider]    Physical Exam: Constitutional: Moderately built and nourished. Vitals:   05/30/20 2028 05/30/20 2029 05/30/20 2102  BP: 136/72    Pulse: 91    Resp: (!) 25    SpO2: 92%  98%  Weight:  122.5 kg   Height:  5\' 6"  (1.676 m)    Eyes: Anicteric no pallor. ENMT: No discharge from  the ears eyes nose or mouth. Neck: JVD elevated. Respiratory: No rhonchi or crepitations. Cardiovascular: S1-S2 heard. Abdomen: Soft nontender bowel sounds present. Musculoskeletal: No edema. Skin: No rash. Neurologic: Alert awake oriented time place and person. Moves all extremities. Psychiatric: Appears normal. Normal affect.   Labs on Admission: I have personally reviewed following labs and imaging studies  CBC: Recent Labs  Lab 05/25/20 0940 05/30/20 2050  WBC 12.0* 17.5*  NEUTROABS  --  14.9*  HGB 10.4* 10.7*  HCT 34.0 37.7  MCV 72* 76.2*  PLT 396 622   Basic Metabolic Panel: Recent Labs  Lab 05/25/20 0940 05/30/20 2050  NA 138 139  K 4.8 4.1  CL 101 104  CO2 21 21*  GLUCOSE 102* 117*  BUN 17 18  CREATININE 1.13* 1.11*  CALCIUM 8.7 8.3*   GFR: Estimated Creatinine Clearance: 59.4 mL/min (A) (by C-G formula based on SCr of 1.11 mg/dL (H)). Liver Function Tests: No results for input(s): AST, ALT, ALKPHOS, BILITOT, PROT, ALBUMIN in the last 168 hours. No results for input(s): LIPASE, AMYLASE in the last 168 hours. No results for input(s): AMMONIA in the last 168 hours. Coagulation Profile: No results for input(s): INR, PROTIME in the last 168 hours. Cardiac Enzymes: No results for input(s): CKTOTAL, CKMB, CKMBINDEX, TROPONINI in the last 168 hours. BNP (last 3 results) No results for input(s): PROBNP in the last 8760 hours. HbA1C: No results for input(s): HGBA1C in the last 72 hours. CBG: No results for input(s): GLUCAP in the last 168 hours. Lipid Profile: No results for input(s): CHOL, HDL, LDLCALC, TRIG, CHOLHDL, LDLDIRECT in the last 72 hours. Thyroid Function Tests: No results for input(s): TSH, T4TOTAL, FREET4, T3FREE, THYROIDAB in the last 72 hours. Anemia Panel: No results for input(s): VITAMINB12, FOLATE, FERRITIN, TIBC, IRON, RETICCTPCT in the last 72 hours. Urine analysis: No results found for: COLORURINE, APPEARANCEUR, LABSPEC, PHURINE,  GLUCOSEU, HGBUR, BILIRUBINUR, KETONESUR, PROTEINUR, UROBILINOGEN, NITRITE, LEUKOCYTESUR Sepsis Labs: @LABRCNTIP (procalcitonin:4,lacticidven:4) ) Recent Results (from the past 240 hour(s))  SARS CORONAVIRUS 2 (TAT 6-24 HRS) Nasopharyngeal Nasopharyngeal Swab     Status: None   Collection Time: 05/26/20  2:17 PM   Specimen: Nasopharyngeal Swab  Result Value Ref Range Status   SARS Coronavirus 2 NEGATIVE NEGATIVE Final  Comment: (NOTE) SARS-CoV-2 target nucleic acids are NOT DETECTED.  The SARS-CoV-2 RNA is generally detectable in upper and lower respiratory specimens during the acute phase of infection. Negative results do not preclude SARS-CoV-2 infection, do not rule out co-infections with other pathogens, and should not be used as the sole basis for treatment or other patient management decisions. Negative results must be combined with clinical observations, patient history, and epidemiological information. The expected result is Negative.  Fact Sheet for Patients: SugarRoll.be  Fact Sheet for Healthcare Providers: https://www.woods-mathews.com/  This test is not yet approved or cleared by the [place] FDA and  has been authorized for detection and/or diagnosis of SARS-CoV-2 by FDA under an Emergency Use Authorization (EUA). This EUA will remain  in effect (meaning this test can be used) for the duration of the COVID-19 declaration under Se ction 564(b)(1) of the Act, 21 U.S.C. section 360bbb-3(b)(1), unless the authorization is terminated or revoked sooner.  Performed at [facility], [place] [address], [place], [place] [address]      Radiological Exams on Admission: DG Chest Port 1 View  Result Date: 05/30/2020 CLINICAL DATA:  Recent cardioversion for atrial fibrillation with wheezing and shortness of breath. EXAM: PORTABLE CHEST 1 VIEW COMPARISON:  July 30, 2016 FINDINGS: Mild to moderate severity diffusely increased  interstitial lung markings are seen. This is increased in severity when compared to the prior study. Mild areas of atelectasis and/or infiltrate are also seen within the bilateral lung bases. There is no evidence of a pleural effusion or pneumothorax. The cardiac silhouette is moderately enlarged. The visualized skeletal structures are unremarkable. IMPRESSION: 1. Mild to moderate severity interstitial edema. 2. Mild bibasilar atelectasis and/or infiltrate. Electronically Signed   By: Virgina Norfolk M.D.   On: 05/30/2020 20:47    EKG: Independently reviewed. Normal sinus rhythm.  Assessment/Plan Principal Problem:   Acute CHF (congestive heart failure) (HCC) Active Problems:   Essential hypertension, benign   Chronic kidney disease, stage III (moderate) (HCC)   Persistent atrial fibrillation (HCC)   OSA (obstructive sleep apnea)    1. Acute on chronic diastolic CHF last EF measured in March 2021 was 60 to 65% with grade 2 diastolic dysfunction presently on Lasix 20 mg daily ~had received 40 mg IV in the ER. Follow intake output metabolic panel daily weights. Cardiology has been notified. 2. Proximal atrial fibrillation status post cardioversion this morning. Presently in sinus rhythm. On Eliquis flecainide verapamil and Toprol-XL. 3. Hypertension on verapamil Toprol-XL and ARB. Note that patient has chronic kidney disease. 4. Chronic kidney disease stage II creatinine appears to be at baseline. 5. Chronic anemia follow CBC. 6. OSA on CPAP. 7. Hypothyroidism on Synthroid. Check TSH. 8. Hyperlipidemia on statins.   DVT prophylaxis: Eliquis. Code Status: Full code. Family Communication: Discussed with patient. Disposition Plan: Home. Consults called: Cardiology. Admission status: Observation.   Rise Patience MD [facility] Pager [phone].  If 7PM-7AM, please contact night-coverage [email or web address] Password [facility] ([facility])  05/30/2020, 10:20 PM

## 2020-05-30 NOTE — ED Provider Notes (Signed)
[facility] EMERGENCY DEPARTMENT Provider Note   CSN: [id number] Arrival date & time: 05/30/20  2017     History No chief complaint on file.   Leslie [name] is a 75 y.o. female.  HPI   75 y/o female - was cardioverted out of afib this m or[place] - has been on eliquis for years for her afib - she states that in the last 3 weeks she had to be cardioverted twice - in between the 2 episodes she had a root canal and needed clinda - had allergic reaction to that - switched to keflex, and finished - no longer on it.  She had cardioverted this morning - was at home - woke from a nap - someone had brought lillies (lots) over to the house and she thinks that after exposure to that she had some wheezing and SOB, was found hypoxic to 90%, ambulatory to the mid 80's, given duoneb by EMS, some improvement - but on 3 L on arival.  Pt has had has bronchitic asthma in the past and takes flovent daily.  She has no tobacco history and had recent stress test which was normal.   Past Medical History:  Diagnosis Date  . Adenomatous polyp   . Arthritis    "knees, legs, fingers" (07/30/2016)  . Asthma   . Bursitis of left shoulder    "just finished PT" (07/30/2016)  . Chest pain    a. 2003 Abnl stress test-->Cath: nonobs CAD.  Marland Kitchen Chronic bronchitis ([place])   . Chronic kidney disease    stage 3  . GERD (gastroesophageal reflux disease)    barrett's esophagus- Dr. Cristina Gong  . History of hiatal hernia   . Hyperlipidemia   . Hypertension   . Hypothyroidism   . Migraines    "sporatic; at least a few/year" (07/30/2016)  . Mitral regurgitation    a. 07/2016 Echo: mild to mod MR;  b. 07/2016 TEE: mild MR.  . Moderate aortic insufficiency    a. 07/2016 Echo: EF 55-60%, mod AI;  b. 07/2016 TEE: EF 50-55%, mod AI.  Marland Kitchen Obesity    s/p lap band surgery 6/10  . OSA on CPAP   . PAF (paroxysmal atrial fibrillation) ([place])    a. 07/2016 TEE/DCCV: EF 50-55%, mild MR, mod AI, mild to mod TR, neg bubble  study-->successful DCCV x1 w/ 120J.  Marland Kitchen PONV (postoperative nausea and vomiting)   . Vitamin D deficiency     Patient Active Problem List   Diagnosis Date Noted  . Acute CHF (congestive heart failure) ([place]) 05/30/2020  . OSA (obstructive sleep apnea) 11/16/2016  . Chronic anticoagulation 09/17/2016  . Mitral valve insufficiency   . Aortic valve regurgitation   . Persistent atrial fibrillation ([place]) 07/30/2016  . Essential hypertension, benign 06/07/2013  . Unspecified vitamin D deficiency 06/07/2013  . Pure hypercholesterolemia 06/07/2013  . Morbid obesity ([place]) 06/07/2013  . Hypothyroidism 06/07/2013  . Chronic kidney disease, stage III (moderate) ([place]) 06/07/2013  . Contact dermatitis and other eczema, due to unspecified cause 06/07/2013    Past Surgical History:  Procedure Laterality Date  . APPENDECTOMY     incidental  . CARDIOVERSION N/A 08/01/2016   Procedure: CARDIOVERSION;  Surgeon: Jerline Pain, MD;  Location: [place];  Service: Cardiovascular;  Laterality: N/A;  . CARDIOVERSION N/A 06/24/2019   Procedure: CARDIOVERSION;  Surgeon: Sanda Klein, MD;  Location: [place] ENDOSCOPY;  Service: Cardiovascular;  Laterality: N/A;  . CARDIOVERSION N/A 05/09/2020   Procedure: CARDIOVERSION;  Surgeon:  [name], MD;  Location: [facility] ENDOSCOPY;  Service: Cardiovascular;  Laterality: N/A;  . CATARACT EXTRACTION W/ INTRAOCULAR LENS  IMPLANT, BILATERAL Bilateral ~ 2008-2017   left - right  . COMBINED HYSTERECTOMY VAGINAL / OOPHORECTOMY / A&P REPAIR  1981  . HERNIA REPAIR    . INGUINAL HERNIA REPAIR Left 1952  . LAPAROSCOPIC GASTRIC BANDING WITH HIATAL HERNIA REPAIR  09/2008  . TEE WITHOUT CARDIOVERSION N/A 08/01/2016   Procedure: TRANSESOPHAGEAL ECHOCARDIOGRAM (TEE);  Surgeon: Jerline Pain, MD;  Location: [place];  Service: Cardiovascular;  Laterality: N/A;  . [place]  . VAGINAL HYSTERECTOMY  1981   "w/1 ovary"     OB History   No  obstetric history on file.     Family History  Problem Relation Age of Onset  . Heart failure Mother   . Hypertension Mother   . Hypertension Father   . Emphysema Father     Social History   Tobacco Use  . Smoking status: Former Research scientist (life sciences)  . Smokeless tobacco: Never Used  . Tobacco comment: 07/30/2016 "social smoker only; in college"  Substance Use Topics  . Alcohol use: Yes    Alcohol/week: 2.0 standard drinks    Types: 1 Glasses of wine, 1 Standard drinks or equivalent per week    Comment: 07/30/2016 "couple drinks/month"  . Drug use: No    Home Medications Prior to Admission medications   Medication Sig Start Date End Date Taking? Authorizing Provider  acetaminophen (TYLENOL) 650 MG CR tablet Take 1,300 mg by mouth every 8 (eight) hours as needed for pain.    [provider]  atorvastatin (LIPITOR) 10 MG tablet Take 10 mg by mouth daily.    [provider]  Bacillus Coagulans-Inulin (ALIGN PREBIOTIC-PROBIOTIC PO) Take 2 capsules by mouth daily. Gummy    [provider]  cholecalciferol (VITAMIN D) 1000 UNITS tablet Take 1,000 Units by mouth daily.    [provider]  diclofenac Sodium (VOLTAREN) 1 % GEL Apply 1 application topically 2 (two) times daily.    [provider]  ELIQUIS 5 MG TABS tablet TAKE 1 TABLET(5 MG) BY MOUTH TWICE DAILY Patient taking differently: Take 5 mg by mouth 2 (two) times daily. 03/24/20   Croitoru, Mihai, MD  fexofenadine (ALLEGRA) 180 MG tablet Take 180 mg by mouth daily.    [provider]  flecainide (TAMBOCOR) 50 MG tablet Take 1 tablet (50 mg total) by mouth 2 (two) times daily. 05/25/20   Croitoru, Mihai, MD  fluticasone (FLONASE) 50 MCG/ACT nasal spray Place 2 sprays into both nostrils at bedtime.     [provider]  fluticasone (FLOVENT HFA) 44 MCG/ACT inhaler Inhale 2 puffs into the lungs 2 (two) times daily. Patient taking differently: Inhale 2 puffs into the lungs every evening.  03/04/18   Croitoru, Mihai, MD  furosemide (LASIX) 20 MG tablet Take 20 mg by mouth daily. 05/20/16   [provider]  Glucosamine-Chondroitin (OSTEO BI-FLEX REGULAR STRENGTH PO) Take 1 tablet by mouth daily.    [provider]  irbesartan (AVAPRO) 150 MG tablet TAKE 1 TABLET(150 MG) BY MOUTH DAILY Patient taking differently: Take 150 mg by mouth daily. 02/01/20   Croitoru, Mihai, MD  levothyroxine (SYNTHROID, LEVOTHROID) 50 MCG tablet Take 50 mcg by mouth daily before breakfast.    [provider]  MAGNESIUM-OXIDE 400 (241.3 Mg) MG tablet TAKE 1 TABLET BY MOUTH DAILY Patient taking differently: Take 400 mg by mouth daily. 01/10/20  Croitoru, Mihai, MD  metoprolol succinate (TOPROL-XL) 100 MG 24 hr tablet Take 100 mg (one tablet) in the morning and 50 mg (half a tablet) in the evening. Patient taking differently: Take 100 mg by mouth See admin instructions. Take 100 mg (one tablet) in the morning and 50 mg (half a tablet) in the evening. 07/28/19   Croitoru, Mihai, MD  metroNIDAZOLE (METROCREAM) 0.75 % cream Apply 1 application topically daily. 05/16/20   [provider]  mometasone (ELOCON) 0.1 % cream Apply 1 application topically 2 (two) times daily as needed (skin irritation). 05/18/20   [provider]  Multiple Vitamins-Minerals (CENTRUM MULTIGUMMIES) CHEW Chew 1 tablet by mouth daily.    [provider]  omeprazole (PRILOSEC) 20 MG capsule Take 40 mg by mouth daily before breakfast. 07/27/16   [provider]  Psyllium (METAMUCIL) 28.3 % POWD Take 1 Scoop by mouth daily.    [provider]  senna (SENOKOT) 8.6 MG tablet Take 1 tablet by mouth every evening.    [provider]  verapamil (CALAN-SR) 180 MG CR tablet Take 180 mg by mouth 2 (two) times daily.    [provider]    Allergies    Clindamycin hcl, Augmentin [amoxicillin-pot clavulanate], Codeine, Demerol [meperidine], and Sulfa antibiotics  Review  of Systems   Review of Systems  All other systems reviewed and are negative.   Physical Exam Updated Vital Signs BP 136/72 (BP Location: Right Arm)   Pulse 91   Resp (!) 25   Ht 1.676 m (5\' 6" )   Wt 122.5 kg   SpO2 98%   BMI 43.58 kg/m   Physical Exam Vitals and nursing note reviewed.  Constitutional:      General: She is in acute distress.     Appearance: She is well-developed and well-nourished.  HENT:     Head: Normocephalic and atraumatic.     Mouth/Throat:     Mouth: Oropharynx is clear and moist.     Pharynx: No oropharyngeal exudate.  Eyes:     General: No scleral icterus.       Right eye: No discharge.        Left eye: No discharge.     Extraocular Movements: EOM normal.     Conjunctiva/sclera: Conjunctivae normal.     Pupils: Pupils are equal, round, and reactive to light.  Neck:     Thyroid: No thyromegaly.     Vascular: No JVD.  Cardiovascular:     Rate and Rhythm: Normal rate and regular rhythm.     Pulses: Intact distal pulses.     Heart sounds: Normal heart sounds. No murmur heard. No friction rub. No gallop.   Pulmonary:     Effort: Respiratory distress present.     Breath sounds: Wheezing present. No rales.     Comments: Mild diffuse wheezing, speaks in just shortned sentences, no rales, no accessory muscle use.  Sat's of 88% on RA Abdominal:     General: Bowel sounds are normal. There is no distension.     Palpations: Abdomen is soft. There is no mass.     Tenderness: There is no abdominal tenderness.  Musculoskeletal:        General: No tenderness or edema. Normal range of motion.     Cervical back: Normal range of motion and neck supple.     Right lower leg: No edema.     Left lower leg: No edema.  Lymphadenopathy:     Cervical: No cervical adenopathy.  Skin:    General: Skin is warm and dry.     Findings: No erythema or rash.  Neurological:     Mental Status: She is alert.     Coordination: Coordination normal.  Psychiatric:         Mood and Affect: Mood and affect normal.        Behavior: Behavior normal.     ED Results / Procedures / Treatments   Labs (all labs ordered are listed, but only abnormal results are displayed) Labs Reviewed  CBC WITH DIFFERENTIAL/PLATELET - Abnormal; Notable for the following components:      Result Value   WBC 17.5 (*)    Hemoglobin 10.7 (*)    MCV 76.2 (*)    MCH 21.6 (*)    MCHC 28.4 (*)    RDW 17.1 (*)    Neutro Abs 14.9 (*)    Abs Immature Granulocytes 0.11 (*)    All other components within normal limits  BASIC METABOLIC PANEL - Abnormal; Notable for the following components:   CO2 21 (*)    Glucose, Bld 117 (*)    Creatinine, Ser 1.11 (*)    Calcium 8.3 (*)    GFR, Estimated 52 (*)    All other components within normal limits  BRAIN NATRIURETIC PEPTIDE - Abnormal; Notable for the following components:   B Natriuretic Peptide 306.6 (*)    All other components within normal limits  SARS CORONAVIRUS 2 (TAT 6-24 HRS)  TSH  BASIC METABOLIC PANEL  CBC    EKG EKG performed on May 30, 2020, normal sinus rhythm with a rate of 90 bpm, normal axis, normal intervals, normal ST segments, normal T waves, no signs of left ventricular hypertrophy. Impression: Normal EKG  Radiology DG Chest Port 1 View  Result Date: 05/30/2020 CLINICAL DATA:  Recent cardioversion for atrial fibrillation with wheezing and shortness of breath. EXAM: PORTABLE CHEST 1 VIEW COMPARISON:  July 30, 2016 FINDINGS: Mild to moderate severity diffusely increased interstitial lung markings are seen. This is increased in severity when compared to the prior study. Mild areas of atelectasis and/or infiltrate are also seen within the bilateral lung bases. There is no evidence of a pleural effusion or pneumothorax. The cardiac silhouette is moderately enlarged. The visualized skeletal structures are unremarkable. IMPRESSION: 1. Mild to moderate severity interstitial edema. 2. Mild bibasilar atelectasis and/or  infiltrate. Electronically Signed   By: Virgina Norfolk M.D.   On: 05/30/2020 20:47    Procedures .Critical Care Performed by: Noemi Chapel, MD Authorized by: Noemi Chapel, MD   Critical care provider statement:    Critical care time (minutes):  35   Critical care time was exclusive of:  Separately billable procedures and treating other patients and teaching time   Critical care was necessary to treat or prevent imminent or life-threatening deterioration of the following conditions:  Respiratory failure and cardiac failure   Critical care was time spent personally by me on the following activities:  Blood draw for specimens, development of treatment plan with patient or surrogate, discussions with consultants, evaluation of patient's response to treatment, examination of patient, obtaining history from patient or surrogate, ordering and performing treatments and interventions, ordering and review of laboratory studies, ordering and review of radiographic studies, pulse oximetry, re-evaluation of patient's condition and review of old charts     Medications Ordered in ED Medications  methylPREDNISolone sodium succinate (SOLU-MEDROL) 125 mg/2 mL injection 125 mg (0 mg Intravenous Hold 05/30/20 2106)  EPINEPHrine (EPI-PEN) injection  0.3 mg (0 mg Intramuscular Hold 05/30/20 2103)  atorvastatin (LIPITOR) tablet 10 mg (has no administration in time range)  flecainide (TAMBOCOR) tablet 50 mg (has no administration in time range)  irbesartan (AVAPRO) tablet 150 mg (has no administration in time range)  verapamil (CALAN-SR) CR tablet 180 mg (has no administration in time range)  levothyroxine (SYNTHROID) tablet 50 mcg (has no administration in time range)  apixaban (ELIQUIS) tablet 5 mg (has no administration in time range)  magnesium oxide (MAG-OX) tablet 400 mg (has no administration in time range)  furosemide (LASIX) injection 20 mg (has no administration in time range)  acetaminophen  (TYLENOL) tablet 650 mg (has no administration in time range)    Or  acetaminophen (TYLENOL) suppository 650 mg (has no administration in time range)  metoprolol succinate (TOPROL-XL) 24 hr tablet 100 mg (has no administration in time range)    And  metoprolol succinate (TOPROL-XL) 24 hr tablet 50 mg (has no administration in time range)  furosemide (LASIX) injection 40 mg (40 mg Intravenous Given 05/30/20 2211)    ED Course  I have reviewed the triage vital signs and the nursing notes.  Pertinent labs & imaging results that were available during my care of the patient were reviewed by me and considered in my medical decision making (see chart for details).  Clinical Course as of 05/30/20 2227  Tue May 30, 2020  2048 D/w Cardiology - they agree that epi is OK if indicated - and with low O2 and some wheezing will proceed. [BM]    Clinical Course User Index [BM] Noemi Chapel, MD   MDM Rules/Calculators/A&P                          Echo 3/21 - EF 81-19%, grade 2 diastolic dysfunction Stress 05/25/20 - normal study.  Pt is in ongoing SOB / distress - nebs ordered, steroids - she is nauseated prompting the thought that she may have some anaphylaxis from the flower exposure - will d/w cardiology prior to epi given no rash, no vomiting, no diarrhea - on 3L for hypoxia at this time.   covid neg 4 days ago - no cough or fever.  The patient initially had some improvement with albuterol but her shortness of breath continued, hypoxemia continued, required 2 to 3 L by nasal cannula and continued to be tachypneic.  I personally viewed her chest x-ray which showed that she had diffuse interstitial moderate to significant amount of edema.  This patient was informed of the results, I think this is more likely a CHF exacerbation than it has any other exact etiology.  Epinephrine was held, diuresis offered and hospitalist consultation requested.  Dr. Hal [place] will admit the patient  Final Clinical  Impression(s) / ED Diagnoses Final diagnoses:  Acute respiratory failure with hypoxia ([place])  Acute pulmonary edema ([place])  Acute on chronic congestive heart failure, unspecified heart failure type [facility])      Noemi Chapel, MD 05/30/20 2228

## 2020-05-30 NOTE — Anesthesia Postprocedure Evaluation (Signed)
Anesthesia Post Note  Patient: Leslie [name]  Procedure(s) Performed: CARDIOVERSION (N/A )     Patient location during evaluation: Endoscopy Anesthesia Type: General Level of consciousness: awake and alert Pain management: pain level controlled Vital Signs Assessment: post-procedure vital signs reviewed and stable Respiratory status: spontaneous breathing, nonlabored ventilation and respiratory function stable Cardiovascular status: blood pressure returned to baseline and stable Postop Assessment: no apparent nausea or vomiting Anesthetic complications: no   No complications documented.  Last Vitals:  Vitals:   05/30/20 0905 05/30/20 0915  BP: 118/69 123/67  Pulse: 70 72  Resp: 20 (!) 22  Temp:    SpO2: 99% 97%    Last Pain:  Vitals:   05/30/20 0915  TempSrc:   PainSc: 0-No pain                 Merlinda Frederick

## 2020-05-30 NOTE — Telephone Encounter (Signed)
Spoke with patient regarding scheduled appointment for the A Fib clinic 06/14/20 at 11:00 am (pt has been to the A Fib clinic before and knows the location)---2-3 month scheduled 09/05/20 at 11:00 am.  Patient voiced her understanding

## 2020-05-30 NOTE — Discharge Instructions (Signed)
Electrical Cardioversion  Electrical cardioversion is the delivery of a jolt of electricity to restore a normal rhythm to the heart. A rhythm that is too fast or is not regular keeps the heart from pumping well. In this procedure, sticky patches or metal paddles are placed on the chest to deliver electricity to the heart from a device.  What can I expect after the procedure?  Your blood pressure, heart rate, breathing rate, and blood oxygen level will be monitored until you leave the hospital or clinic.  Your heart rhythm will be watched to make sure it does not change.  You may have some redness on the skin where the shocks were given.If this occurs, can use hydrocortisone cream or Aloe vera.  Follow these instructions at home:  Do not drive for 24 hours if you were given a sedative during your procedure.  Take over-the-counter and prescription medicines only as told by your health care provider.  Ask your health care provider how to check your pulse. Check it often.  Rest for 48 hours after the procedure or as told by your health care provider.  Avoid or limit your caffeine use as told by your health care provider.  Keep all follow-up visits as told by your health care provider. This is important.  Contact a health care provider if:  You feel like your heart is beating too quickly or your pulse is not regular.  You have a serious muscle cramp that does not go away.  Get help right away if:  You have discomfort in your chest.  You are dizzy or you feel faint.  You have trouble breathing or you are short of breath.  Your speech is slurred.  You have trouble moving an arm or leg on one side of your body.  Your fingers or toes turn cold or blue.  Summary  Electrical cardioversion is the delivery of a jolt of electricity to restore a normal rhythm to the heart.  This procedure may be done right away in an emergency or may be a scheduled procedure if the condition is not  an emergency.  Generally, this is a safe procedure.  After the procedure, check your pulse often as told by your health care provider.  This information is not intended to replace advice given to you by your health care provider. Make sure you discuss any questions you have with your health care provider. Document Revised: 11/02/2018 Document Reviewed: 11/02/2018 Elsevier Patient Education  2021 Elsevier Inc.  

## 2020-05-30 NOTE — Interval H&P Note (Signed)
History and Physical Interval Note:  05/30/2020 8:19 AM  Leslie [name]  has presented today for surgery, with the diagnosis of AFIB.  The various methods of treatment have been discussed with the patient and family. After consideration of risks, benefits and other options for treatment, the patient has consented to  Procedure(s): CARDIOVERSION (N/A) as a surgical intervention.  The patient's history has been reviewed, patient examined, no change in status, stable for surgery.  I have reviewed the patient's chart and labs.  Questions were answered to the patient's satisfaction.     Timtohy Broski

## 2020-05-30 NOTE — Anesthesia Preprocedure Evaluation (Addendum)
Anesthesia Evaluation  Patient identified by MRN, date of birth, ID band Patient awake    Reviewed: Allergy & Precautions, NPO status , Patient's Chart, lab work & pertinent test results, reviewed documented beta blocker date and time   History of Anesthesia Complications (+) PONV and history of anesthetic complications  Airway Mallampati: II  TM Distance: >3 FB Neck ROM: Full    Dental no notable dental hx. (+) Teeth Intact, Dental Advisory Given   Pulmonary asthma , sleep apnea , former smoker,    Pulmonary exam normal        Cardiovascular hypertension, Pt. on medications and Pt. on home beta blockers Normal cardiovascular exam+ dysrhythmias Atrial Fibrillation + Valvular Problems/Murmurs AI and MR  Rhythm:Irregular Rate:Normal + Systolic murmurs Normal stress test 05/24/20   Neuro/Psych  Headaches, negative psych ROS   GI/Hepatic Neg liver ROS, hiatal hernia, GERD  ,  Endo/Other  Hypothyroidism Morbid obesity  Renal/GU Renal InsufficiencyRenal disease  negative genitourinary   Musculoskeletal  (+) Arthritis ,   Abdominal (+) + obese,   Peds  Hematology   Anesthesia Other Findings ventricular ejection fraction, by estimation, is 60 to 65%. The left ventricle has normal function. The left ventricle has no regional wall motion abnormalities. Left ventricular diastolic parameters are consistent with Grade II diastolic dysfunction (pseudonormalization). Elevated left ventricular end-diastolic pressure. The average left ventricular global longitudinal strain is -20.5 %. 2. Right ventricular systolic function is normal. The right ventricular size is normal. Tricuspid regurgitation signal is inadequate for assessing PA pressure. 3. Left atrial size was moderately dilated. 4. The mitral valve is normal in structure. Mild mitral valve regurgitation. No evidence of mitral stenosis. 5. The aortic valve is normal in  structure. Aortic valve regurgitation is moderate. No aortic stenosis is present. Aortic regurgitation PHT measures 341 msec. 6. The inferior vena cava is normal in size with greater than 50% respiratory variability, suggesting right atrial pressure of 3 mmHg. Compari  Reproductive/Obstetrics                             Anesthesia Physical  Anesthesia Plan  ASA: III  Anesthesia Plan: General   Post-op Pain Management:    Induction: Intravenous  PONV Risk Score and Plan: Treatment may vary due to age or medical condition, Propofol infusion and TIVA  Airway Management Planned: Mask  Additional Equipment: None  Intra-op Plan:   Post-operative Plan: Extubation in OR  Informed Consent: I have reviewed the patients History and Physical, chart, labs and discussed the procedure including the risks, benefits and alternatives for the proposed anesthesia with the patient or authorized representative who has indicated his/her understanding and acceptance.     Dental advisory given  Plan Discussed with: CRNA and Anesthesiologist  Anesthesia Plan Comments:         Anesthesia Quick Evaluation

## 2020-05-30 NOTE — Op Note (Signed)
Procedure: Electrical Cardioversion Indications:  Atrial Fibrillation  Procedure Details:  Consent: Risks of procedure as well as the alternatives and risks of each were explained to the (patient/caregiver).  Consent for procedure obtained.  Time Out: Verified patient identification, verified procedure, site/side was marked, verified correct patient position, special equipment/implants available, medications/allergies/relevent history reviewed, required imaging and test results available.  Performed  Patient placed on cardiac monitor, pulse oximetry, supplemental oxygen as necessary.  Sedation given: propofol 50 mg IV (Dr. Elgie Congo) Pacer pads placed anterior and posterior chest.  Cardioverted 1 time(s).  Cardioversion with synchronized biphasic 200J shock.  Evaluation: Findings: Post procedure EKG shows: NSR Complications: None Patient did tolerate procedure well.  Time Spent Directly with the Patient:  30 minutes   Wren Pryce 05/30/2020, 8:51 AM

## 2020-05-30 NOTE — ED Triage Notes (Signed)
Pt BIB EMS from home for SOB. Sats in low 90s RA upon EMS arrival, 80s with movement. Duodeb given by EMS and placed on 2L [place]. Pt denies CP  Cardioversion performed today at [facility] for atrial fib, - second time in the last 3 weeks.   EMS: 170/110 HR 84 RR 18-20

## 2020-05-30 NOTE — Transfer of Care (Signed)
Immediate Anesthesia Transfer of Care Note  Patient: Leslie [name]  Procedure(s) Performed: CARDIOVERSION (N/A )  Patient Location: Endoscopy Unit  Anesthesia Type:General  Level of Consciousness: awake, alert  and oriented  Airway & Oxygen Therapy: Patient Spontanous Breathing and Patient connected to nasal cannula oxygen  Post-op Assessment: Report given to RN, Post -op Vital signs reviewed and stable and Patient moving all extremities  Post vital signs: Reviewed and stable  Last Vitals:  Vitals Value Taken Time  BP    Temp    Pulse    Resp    SpO2      Last Pain:  Vitals:   05/30/20 0826  TempSrc: Oral  PainSc: 0-No pain         Complications: No complications documented.

## 2020-05-30 NOTE — Anesthesia Procedure Notes (Signed)
Procedure Name: General with mask airway Date/Time: 05/30/2020 8:48 AM Performed by: Amadeo Garnet, CRNA Pre-anesthesia Checklist: Patient identified, Emergency Drugs available, Suction available and Patient being monitored Patient Re-evaluated:Patient Re-evaluated prior to induction Oxygen Delivery Method: Ambu bag Preoxygenation: Pre-oxygenation with 100% oxygen Induction Type: IV induction Placement Confirmation: positive ETCO2 Dental Injury: Teeth and Oropharynx as per pre-operative assessment

## 2020-05-31 ENCOUNTER — Encounter (HOSPITAL_COMMUNITY): Payer: Self-pay | Admitting: Internal Medicine

## 2020-05-31 DIAGNOSIS — Z9049 Acquired absence of other specified parts of digestive tract: Secondary | ICD-10-CM | POA: Diagnosis not present

## 2020-05-31 DIAGNOSIS — Z20822 Contact with and (suspected) exposure to covid-19: Secondary | ICD-10-CM | POA: Diagnosis not present

## 2020-05-31 DIAGNOSIS — I4819 Other persistent atrial fibrillation: Secondary | ICD-10-CM

## 2020-05-31 DIAGNOSIS — Z881 Allergy status to other antibiotic agents status: Secondary | ICD-10-CM | POA: Diagnosis not present

## 2020-05-31 DIAGNOSIS — Z882 Allergy status to sulfonamides status: Secondary | ICD-10-CM | POA: Diagnosis not present

## 2020-05-31 DIAGNOSIS — G4733 Obstructive sleep apnea (adult) (pediatric): Secondary | ICD-10-CM

## 2020-05-31 DIAGNOSIS — Z880 Allergy status to penicillin: Secondary | ICD-10-CM | POA: Diagnosis not present

## 2020-05-31 DIAGNOSIS — K219 Gastro-esophageal reflux disease without esophagitis: Secondary | ICD-10-CM | POA: Diagnosis present

## 2020-05-31 DIAGNOSIS — Z7901 Long term (current) use of anticoagulants: Secondary | ICD-10-CM | POA: Diagnosis not present

## 2020-05-31 DIAGNOSIS — I130 Hypertensive heart and chronic kidney disease with heart failure and stage 1 through stage 4 chronic kidney disease, or unspecified chronic kidney disease: Secondary | ICD-10-CM | POA: Diagnosis not present

## 2020-05-31 DIAGNOSIS — Z87891 Personal history of nicotine dependence: Secondary | ICD-10-CM | POA: Diagnosis not present

## 2020-05-31 DIAGNOSIS — N1831 Chronic kidney disease, stage 3a: Secondary | ICD-10-CM | POA: Diagnosis not present

## 2020-05-31 DIAGNOSIS — Z79899 Other long term (current) drug therapy: Secondary | ICD-10-CM | POA: Diagnosis not present

## 2020-05-31 DIAGNOSIS — J810 Acute pulmonary edema: Secondary | ICD-10-CM | POA: Diagnosis present

## 2020-05-31 DIAGNOSIS — I5033 Acute on chronic diastolic (congestive) heart failure: Secondary | ICD-10-CM | POA: Diagnosis not present

## 2020-05-31 DIAGNOSIS — N183 Chronic kidney disease, stage 3 unspecified: Secondary | ICD-10-CM

## 2020-05-31 DIAGNOSIS — I10 Essential (primary) hypertension: Secondary | ICD-10-CM

## 2020-05-31 DIAGNOSIS — D509 Iron deficiency anemia, unspecified: Secondary | ICD-10-CM | POA: Diagnosis present

## 2020-05-31 DIAGNOSIS — I5021 Acute systolic (congestive) heart failure: Secondary | ICD-10-CM | POA: Diagnosis not present

## 2020-05-31 DIAGNOSIS — D72828 Other elevated white blood cell count: Secondary | ICD-10-CM | POA: Diagnosis present

## 2020-05-31 DIAGNOSIS — I5031 Acute diastolic (congestive) heart failure: Secondary | ICD-10-CM | POA: Diagnosis not present

## 2020-05-31 DIAGNOSIS — I509 Heart failure, unspecified: Secondary | ICD-10-CM

## 2020-05-31 DIAGNOSIS — Z79890 Hormone replacement therapy (postmenopausal): Secondary | ICD-10-CM | POA: Diagnosis not present

## 2020-05-31 DIAGNOSIS — E785 Hyperlipidemia, unspecified: Secondary | ICD-10-CM | POA: Diagnosis not present

## 2020-05-31 DIAGNOSIS — D631 Anemia in chronic kidney disease: Secondary | ICD-10-CM | POA: Diagnosis not present

## 2020-05-31 DIAGNOSIS — E039 Hypothyroidism, unspecified: Secondary | ICD-10-CM | POA: Diagnosis not present

## 2020-05-31 DIAGNOSIS — Z7951 Long term (current) use of inhaled steroids: Secondary | ICD-10-CM | POA: Diagnosis not present

## 2020-05-31 DIAGNOSIS — Z6841 Body Mass Index (BMI) 40.0 and over, adult: Secondary | ICD-10-CM | POA: Diagnosis not present

## 2020-05-31 LAB — CBC
HCT: 32.8 % — ABNORMAL LOW (ref 36.0–46.0)
Hemoglobin: 9.9 g/dL — ABNORMAL LOW (ref 12.0–15.0)
MCH: 22.2 pg — ABNORMAL LOW (ref 26.0–34.0)
MCHC: 30.2 g/dL (ref 30.0–36.0)
MCV: 73.5 fL — ABNORMAL LOW (ref 80.0–100.0)
Platelets: 360 10*3/uL (ref 150–400)
RBC: 4.46 MIL/uL (ref 3.87–5.11)
RDW: 17 % — ABNORMAL HIGH (ref 11.5–15.5)
WBC: 12.6 10*3/uL — ABNORMAL HIGH (ref 4.0–10.5)
nRBC: 0 % (ref 0.0–0.2)

## 2020-05-31 LAB — BASIC METABOLIC PANEL
Anion gap: 13 (ref 5–15)
BUN: 13 mg/dL (ref 8–23)
CO2: 23 mmol/L (ref 22–32)
Calcium: 8.5 mg/dL — ABNORMAL LOW (ref 8.9–10.3)
Chloride: 105 mmol/L (ref 98–111)
Creatinine, Ser: 1.14 mg/dL — ABNORMAL HIGH (ref 0.44–1.00)
GFR, Estimated: 51 mL/min — ABNORMAL LOW (ref >60)
Glucose, Bld: 111 mg/dL — ABNORMAL HIGH (ref 70–99)
Potassium: 3.5 mmol/L (ref 3.5–5.1)
Sodium: 141 mmol/L (ref 135–145)

## 2020-05-31 LAB — SARS CORONAVIRUS 2 (TAT 6-24 HRS): SARS Coronavirus 2: NEGATIVE

## 2020-05-31 LAB — TSH: TSH: 3.41 u[IU]/mL (ref 0.350–4.500)

## 2020-05-31 MED ORDER — FUROSEMIDE 10 MG/ML IJ SOLN
40.0000 mg | Freq: Two times a day (BID) | INTRAMUSCULAR | Status: DC
  Administered 2020-05-31 – 2020-06-02 (×4): 40 mg via INTRAVENOUS
  Filled 2020-05-31 (×4): qty 4

## 2020-05-31 MED ORDER — FUROSEMIDE 10 MG/ML IJ SOLN
20.0000 mg | INTRAMUSCULAR | Status: AC
  Administered 2020-05-31: 20 mg via INTRAVENOUS
  Filled 2020-05-31: qty 2

## 2020-05-31 MED ORDER — POTASSIUM CHLORIDE 20 MEQ PO PACK
40.0000 meq | PACK | Freq: Two times a day (BID) | ORAL | Status: AC
  Administered 2020-05-31 (×2): 40 meq via ORAL
  Filled 2020-05-31 (×2): qty 2

## 2020-05-31 MED ORDER — PANTOPRAZOLE SODIUM 40 MG PO TBEC
40.0000 mg | DELAYED_RELEASE_TABLET | Freq: Every day | ORAL | Status: DC
  Administered 2020-05-31 – 2020-06-03 (×4): 40 mg via ORAL
  Filled 2020-05-31 (×4): qty 1

## 2020-05-31 NOTE — Progress Notes (Signed)
PROGRESS NOTE    Leslie [name]  VOZ:[id number] DOB: 1945/12/14 DOA: 05/30/2020 PCP: Glenis Smoker, MD   Brief Narrative:  Leslie [name] is a 75 y.o. female with history of paroxysmal atrial fibrillation status post cardioversion this morning with history of hypertension hypothyroidism hyperlipidemia presents to the ER after patient became more short of breath towards the evening. Patient states even prior to the cardioversion patient has been mildly short of breath. Has been taking diuretics at home. Patient has been cardioverted for the second time in the last 3 weeks. Has been compliant with Eliquis. Patient states towards the evening patient became more acutely short of breath even at rest but no chest pain or productive cough.  ED Course: In the ER chest x-ray shows features concerning for fluid overload and BNP was around 300. WBC count was 17.5. Patient was given Lasix 40 mg IV and admitted for acute CHF. Covid test was negative.   Assessment & Plan:  Acute systolic heart failure: -Patient presented with signs of fluid overload with shortness of breath.  BNP: 306.6.  Reviewed chest x-ray.  TSH: WNL. -Likely related to persistent A. Fib underwent cardioversion on February 15.   -Echo is pending.  Patient was given 40 IV Lasix in ED. -Started on Lasix 20 mg IV twice daily-increased to 40 IV twice daily as per cardiology -Strict INO's and daily weight. -Keep potassium above 4 and magnesium above 2  Persistent A. Fib: -Underwent DCCV on 1/25 but failed and then repeat DCCV on 02/15. ]-Continue flecainide, Eliquis, verapamil, metoprolol  Hyperlipidemia: Continue statin  Hypertension: Stable -Continue irbesartan, metoprolol  Leukocytosis: Chronic -Patient remained afebrile.  No signs of infection.  Leukocytosis is improving -COVID-19 is negative.  She was treated last week with antibiotics for infection  Hypothyroidism: TSH: WNL.  Continue levothyroxine  CKD stage  IIa: -Baseline.  Continue to monitor.  OSA on CPAP  Morbid obesity with BMI of 43: -Diet modification/exercise and weight loss recommended.  Anemia of chronic disease: At baseline.  Continue to monitor  DVT prophylaxis: Eliquis Code Status: Full code Family Communication:  None present at bedside.  Plan of care discussed with patient in length and she verbalized understanding and agreed with it. Disposition Plan: Home in 1 to 2 days  Consultants:   Cardiology  Procedures:  Echo Antimicrobials:   None  Status is: Observation   Dispo: The patient is from: Home              Anticipated d/c is to: Home              Anticipated d/c date is: 1 day              Patient currently is not medically stable to d/c.   Difficult to place patient No    Subjective: Patient seen and examined in the ED.  Sitting comfortably on the bed and eating breakfast.   denies chest pain, nausea, vomiting, worsening shortness of breath or worsening leg swelling.  Objective: Vitals:   05/31/20 0928 05/31/20 0930 05/31/20 0946 05/31/20 1000  BP: (!) 117/91   133/71  Pulse: 79 80  81  Resp:  (!) 21  18  Temp:      TempSrc:      SpO2:  96% 96% 96%  Weight:      Height:        Intake/Output Summary (Last 24 hours) at 05/31/2020 1041 Last data filed at 05/31/2020 0109 Gross per 24 hour  Intake --  Output 1200 ml  Net -1200 ml   Filed Weights   05/30/20 2029  Weight: 122.5 kg    Examination:  General exam: Appears calm and comfortable, on nasal cannula, communicating well, obese Respiratory system: Decreased breath sound noted on the bases.  No wheezing or crackles. Cardiovascular system: S1 & S2 heard, RRR. No JVD, murmurs, rubs, gallops or clicks.  Trace pedal edema positive Gastrointestinal system: Abdomen is nondistended, soft and nontender. No organomegaly or masses felt. Normal bowel sounds heard. Central nervous system: Alert and oriented. No focal neurological  deficits. Extremities: Symmetric 5 x 5 power. Skin: No rashes, lesions or ulcers Psychiatry: Judgement and insight appear normal. Mood & affect appropriate.    Data Reviewed: I have personally reviewed following labs and imaging studies  CBC: Recent Labs  Lab 05/25/20 0940 05/30/20 2050 05/31/20 0500  WBC 12.0* 17.5* 12.6*  NEUTROABS  --  14.9*  --   HGB 10.4* 10.7* 9.9*  HCT 34.0 37.7 32.8*  MCV 72* 76.2* 73.5*  PLT 396 382 299   Basic Metabolic Panel: Recent Labs  Lab 05/25/20 0940 05/30/20 2050 05/31/20 0500  NA 138 139 141  K 4.8 4.1 3.5  CL 101 104 105  CO2 21 21* 23  GLUCOSE 102* 117* 111*  BUN 17 18 13   CREATININE 1.13* 1.11* 1.14*  CALCIUM 8.7 8.3* 8.5*   GFR: Estimated Creatinine Clearance: 57.8 mL/min (A) (by C-G formula based on SCr of 1.14 mg/dL (H)). Liver Function Tests: No results for input(s): AST, ALT, ALKPHOS, BILITOT, PROT, ALBUMIN in the last 168 hours. No results for input(s): LIPASE, AMYLASE in the last 168 hours. No results for input(s): AMMONIA in the last 168 hours. Coagulation Profile: No results for input(s): INR, PROTIME in the last 168 hours. Cardiac Enzymes: No results for input(s): CKTOTAL, CKMB, CKMBINDEX, TROPONINI in the last 168 hours. BNP (last 3 results) No results for input(s): PROBNP in the last 8760 hours. HbA1C: No results for input(s): HGBA1C in the last 72 hours. CBG: No results for input(s): GLUCAP in the last 168 hours. Lipid Profile: No results for input(s): CHOL, HDL, LDLCALC, TRIG, CHOLHDL, LDLDIRECT in the last 72 hours. Thyroid Function Tests: Recent Labs    05/31/20 0500  TSH 3.410   Anemia Panel: No results for input(s): VITAMINB12, FOLATE, FERRITIN, TIBC, IRON, RETICCTPCT in the last 72 hours. Sepsis Labs: No results for input(s): PROCALCITON, LATICACIDVEN in the last 168 hours.  Recent Results (from the past 240 hour(s))  SARS CORONAVIRUS 2 (TAT 6-24 HRS) Nasopharyngeal Nasopharyngeal Swab      Status: None   Collection Time: 05/26/20  2:17 PM   Specimen: Nasopharyngeal Swab  Result Value Ref Range Status   SARS Coronavirus 2 NEGATIVE NEGATIVE Final    Comment: (NOTE) SARS-CoV-2 target nucleic acids are NOT DETECTED.  The SARS-CoV-2 RNA is generally detectable in upper and lower respiratory specimens during the acute phase of infection. Negative results do not preclude SARS-CoV-2 infection, do not rule out co-infections with other pathogens, and should not be used as the sole basis for treatment or other patient management decisions. Negative results must be combined with clinical observations, patient history, and epidemiological information. The expected result is Negative.  Fact Sheet for Patients: SugarRoll.be  Fact Sheet for Healthcare Providers: https://www.woods-mathews.com/  This test is not yet approved or cleared by the [place] FDA and  has been authorized for detection and/or diagnosis of SARS-CoV-2 by FDA under an Emergency Use Authorization (EUA). This  EUA will remain  in effect (meaning this test can be used) for the duration of the COVID-19 declaration under Se ction 564(b)(1) of the Act, 21 U.S.C. section 360bbb-3(b)(1), unless the authorization is terminated or revoked sooner.  Performed at [facility], [place] [address] [place], [place] [address]   SARS CORONAVIRUS 2 (TAT 6-24 HRS) Nasopharyngeal Nasopharyngeal Swab     Status: None   Collection Time: 05/30/20 10:10 PM   Specimen: Nasopharyngeal Swab  Result Value Ref Range Status   SARS Coronavirus 2 NEGATIVE NEGATIVE Final    Comment: (NOTE) SARS-CoV-2 target nucleic acids are NOT DETECTED.  The SARS-CoV-2 RNA is generally detectable in upper and lower respiratory specimens during the acute phase of infection. Negative results do not preclude SARS-CoV-2 infection, do not rule out co-infections with other pathogens, and should not be used as  the sole basis for treatment or other patient management decisions. Negative results must be combined with clinical observations, patient history, and epidemiological information. The expected result is Negative.  Fact Sheet for Patients: SugarRoll.be  Fact Sheet for Healthcare Providers: https://www.woods-mathews.com/  This test is not yet approved or cleared by the [place] FDA and  has been authorized for detection and/or diagnosis of SARS-CoV-2 by FDA under an Emergency Use Authorization (EUA). This EUA will remain  in effect (meaning this test can be used) for the duration of the COVID-19 declaration under Se ction 564(b)(1) of the Act, 21 U.S.C. section 360bbb-3(b)(1), unless the authorization is terminated or revoked sooner.  Performed at [facility], [place] [address] [place], [place] [address]       Radiology Studies: DG Chest Port 1 View  Result Date: 05/30/2020 CLINICAL DATA:  Recent cardioversion for atrial fibrillation with wheezing and shortness of breath. EXAM: PORTABLE CHEST 1 VIEW COMPARISON:  July 30, 2016 FINDINGS: Mild to moderate severity diffusely increased interstitial lung markings are seen. This is increased in severity when compared to the prior study. Mild areas of atelectasis and/or infiltrate are also seen within the bilateral lung bases. There is no evidence of a pleural effusion or pneumothorax. The cardiac silhouette is moderately enlarged. The visualized skeletal structures are unremarkable. IMPRESSION: 1. Mild to moderate severity interstitial edema. 2. Mild bibasilar atelectasis and/or infiltrate. Electronically Signed   By: Virgina Norfolk M.D.   On: 05/30/2020 20:47    Scheduled Meds: . apixaban  5 mg Oral BID  . atorvastatin  10 mg Oral Daily  . EPINEPHrine  0.3 mg Intramuscular Once  . flecainide  50 mg Oral BID  . furosemide  20 mg Intravenous NOW  . furosemide  40 mg Intravenous Q12H   . irbesartan  150 mg Oral Daily  . levothyroxine  50 mcg Oral QAC breakfast  . magnesium oxide  400 mg Oral Daily  . methylPREDNISolone (SOLU-MEDROL) injection  125 mg Intravenous Once  . metoprolol succinate  100 mg Oral Daily   And  . metoprolol succinate  50 mg Oral QHS  . pantoprazole  40 mg Oral Daily  . verapamil  180 mg Oral BID   Continuous Infusions:   LOS: 0 days   Time spent: 35 minutes   Marvelyn Bouchillon Loann Quill, MD [facility]  If 7PM-7AM, please contact night-coverage [email or web address] 05/31/2020, 10:41 AM

## 2020-05-31 NOTE — ED Notes (Signed)
md kakrakandy at bedside

## 2020-05-31 NOTE — ED Notes (Signed)
Tele Breakfast order placed 

## 2020-05-31 NOTE — Consult Note (Signed)
Cardiology Consultation:   Patient ID: Leslie [name]: [id number]; DOB: [birth year]  Admit date: 05/30/2020 Date of Consult: 05/31/2020  PCP:  Glenis Smoker, MD   [place]  Cardiologist:  Sanda Klein, MD  Advanced Practice Provider:  Rosaria Ferries PA-C Electrophysiologist:  None  EP - Advanced Practice Provider:  Roderic Palau NP    Patient Profile:   Leslie [name] is a 75 y.o. female with a hx of obesity, OSA on CPAP therapy, persistent atrial fibrillation, HTN, HLD, hypothyroidism and stage II CKD who is being seen today for the evaluation of acute CHF at the request of Dr. Doristine Bosworth.  History of Present Illness:   Leslie [name] is a 75 year old female with past medical history of obesity, OSA on CPAP therapy, persistent atrial fibrillation, HTN, HLD, hypothyroidism and stage II CKD.  She was diagnosed with atrial fibrillation in 2018, due to difficulty with rate control, she underwent TEE DCCV.  By May 2018, beta-blocker was decreased due to junctional rhythm.  She developed atrial fibrillation after her second COVID-19 vaccine in February 2021.  Previous echocardiogram in March 2021 demonstrated moderate AI and mild MR.  Patient was last seen by Roderic Palau of atrial fibrillation clinic on 05/02/2020 for symptomatic recurrent atrial fibrillation.  She underwent a successful outpatient cardioversion on 05/09/2020.  Unfortunately by the time she was followed up in the clinic on 05/16/2020, she has already went back into atrial fibrillation again.  After discussing the case with Dr. Sallyanne Kuster, she underwent Myoview on 2/9 which was low risk, after that, she was started on flecainide 50 mg twice a day and underwent repeat successful cardioversion on 05/30/2020.  She did not wish to try amiodarone due to history of thyroid issue.  Over the past few days, she has been having worsening dyspnea with exertion, initially this was attributed to atrial fibrillation.   Shortly after she was discharged from successful cardioversion yesterday, she is began to notice worsening dyspnea throughout the rest of the day.  Eventually she came back to the emergency room due to acute respiratory failure.  Initial BNP was 306.  Creatinine 1.11.  White blood cell count was elevated at 17.5.  Covid test was negative.  Chest x-ray showed mild to moderate severity interstitial edema.  EKG shows sinus rhythm.  Patient was admitted overnight by hospitalist service and started on 40 mg IV Lasix.  So far she has put out 1200 cc of urine.   Past Medical History:  Diagnosis Date  . Adenomatous polyp   . Arthritis    "knees, legs, fingers" (07/30/2016)  . Asthma   . Bursitis of left shoulder    "just finished PT" (07/30/2016)  . Chest pain    a. 2003 Abnl stress test-->Cath: nonobs CAD.  Marland Kitchen Chronic bronchitis ([place])   . Chronic kidney disease    stage 3  . GERD (gastroesophageal reflux disease)    barrett's esophagus- Dr. Cristina Gong  . History of hiatal hernia   . Hyperlipidemia   . Hypertension   . Hypothyroidism   . Migraines    "sporatic; at least a few/year" (07/30/2016)  . Mitral regurgitation    a. 07/2016 Echo: mild to mod MR;  b. 07/2016 TEE: mild MR.  . Moderate aortic insufficiency    a. 07/2016 Echo: EF 55-60%, mod AI;  b. 07/2016 TEE: EF 50-55%, mod AI.  Marland Kitchen Obesity    s/p lap band surgery 6/10  . OSA on CPAP   .  PAF (paroxysmal atrial fibrillation) ([place])    a. 07/2016 TEE/DCCV: EF 50-55%, mild MR, mod AI, mild to mod TR, neg bubble study-->successful DCCV x1 w/ 120J.  Marland Kitchen PONV (postoperative nausea and vomiting)   . Vitamin D deficiency     Past Surgical History:  Procedure Laterality Date  . APPENDECTOMY     incidental  . CARDIOVERSION N/A 08/01/2016   Procedure: CARDIOVERSION;  Surgeon: Jerline Pain, MD;  Location: [facility];  Service: Cardiovascular;  Laterality: N/A;  . CARDIOVERSION N/A 06/24/2019   Procedure: CARDIOVERSION;  Surgeon: Sanda Klein, MD;   Location: [facility];  Service: Cardiovascular;  Laterality: N/A;  . CARDIOVERSION N/A 05/09/2020   Procedure: CARDIOVERSION;  Surgeon: [name], MD;  Location: [facility];  Service: Cardiovascular;  Laterality: N/A;  . CATARACT EXTRACTION W/ INTRAOCULAR LENS  IMPLANT, BILATERAL Bilateral ~ 2008-2017   left - right  . COMBINED HYSTERECTOMY VAGINAL / OOPHORECTOMY / A&P REPAIR  1981  . HERNIA REPAIR    . INGUINAL HERNIA REPAIR Left 1952  . LAPAROSCOPIC GASTRIC BANDING WITH HIATAL HERNIA REPAIR  09/2008  . TEE WITHOUT CARDIOVERSION N/A 08/01/2016   Procedure: TRANSESOPHAGEAL ECHOCARDIOGRAM (TEE);  Surgeon: Jerline Pain, MD;  Location: [facility];  Service: Cardiovascular;  Laterality: N/A;  . [place]  . VAGINAL HYSTERECTOMY  1981   "w/1 ovary"     Home Medications:  Prior to Admission medications   Medication Sig Start Date End Date Taking? Authorizing Provider  acetaminophen (TYLENOL) 650 MG CR tablet Take 1,300 mg by mouth every 8 (eight) hours as needed for pain.    [provider]  atorvastatin (LIPITOR) 10 MG tablet Take 10 mg by mouth daily.    [provider]  Bacillus Coagulans-Inulin (ALIGN PREBIOTIC-PROBIOTIC PO) Take 2 capsules by mouth daily. Gummy    [provider]  cholecalciferol (VITAMIN D) 1000 UNITS tablet Take 1,000 Units by mouth daily.    [provider]  diclofenac Sodium (VOLTAREN) 1 % GEL Apply 1 application topically 2 (two) times daily.    [provider]  ELIQUIS 5 MG TABS tablet TAKE 1 TABLET(5 MG) BY MOUTH TWICE DAILY Patient taking differently: Take 5 mg by mouth 2 (two) times daily. 03/24/20   Croitoru, Mihai, MD  fexofenadine (ALLEGRA) 180 MG tablet Take 180 mg by mouth daily.    [provider]  flecainide (TAMBOCOR) 50 MG tablet Take 1 tablet (50 mg total) by mouth 2 (two) times daily. 05/25/20   Croitoru, Mihai, MD  fluticasone (FLONASE) 50 MCG/ACT nasal  spray Place 2 sprays into both nostrils at bedtime.     [provider]  fluticasone (FLOVENT HFA) 44 MCG/ACT inhaler Inhale 2 puffs into the lungs 2 (two) times daily. Patient taking differently: Inhale 2 puffs into the lungs every evening. 03/04/18   Croitoru, Mihai, MD  furosemide (LASIX) 20 MG tablet Take 20 mg by mouth daily. 05/20/16   [provider]  Glucosamine-Chondroitin (OSTEO BI-FLEX REGULAR STRENGTH PO) Take 1 tablet by mouth daily.    [provider]  irbesartan (AVAPRO) 150 MG tablet TAKE 1 TABLET(150 MG) BY MOUTH DAILY Patient taking differently: Take 150 mg by mouth daily. 02/01/20   Croitoru, Mihai, MD  levothyroxine (SYNTHROID, LEVOTHROID) 50 MCG tablet Take 50 mcg by mouth daily before breakfast.    [provider]  MAGNESIUM-OXIDE 400 (241.3 Mg) MG tablet TAKE 1 TABLET BY MOUTH DAILY Patient taking differently: Take 400 mg by mouth daily. 01/10/20  Croitoru, Mihai, MD  metoprolol succinate (TOPROL-XL) 100 MG 24 hr tablet Take 100 mg (one tablet) in the morning and 50 mg (half a tablet) in the evening. Patient taking differently: Take 100 mg by mouth See admin instructions. Take 100 mg (one tablet) in the morning and 50 mg (half a tablet) in the evening. 07/28/19   Croitoru, Mihai, MD  metroNIDAZOLE (METROCREAM) 0.75 % cream Apply 1 application topically daily. 05/16/20   [provider]  mometasone (ELOCON) 0.1 % cream Apply 1 application topically 2 (two) times daily as needed (skin irritation). 05/18/20   [provider]  Multiple Vitamins-Minerals (CENTRUM MULTIGUMMIES) CHEW Chew 1 tablet by mouth daily.    [provider]  omeprazole (PRILOSEC) 20 MG capsule Take 40 mg by mouth daily before breakfast. 07/27/16   [provider]  Psyllium (METAMUCIL) 28.3 % POWD Take 1 Scoop by mouth daily.    [provider]  senna (SENOKOT) 8.6 MG tablet Take 1 tablet by mouth every evening.    [provider]  verapamil (CALAN-SR) 180 MG CR tablet Take 180 mg by mouth 2 (two) times daily.    [provider]    Inpatient Medications: Scheduled Meds: . apixaban  5 mg Oral BID  . atorvastatin  10 mg Oral Daily  . EPINEPHrine  0.3 mg Intramuscular Once  . flecainide  50 mg Oral BID  . furosemide  20 mg Intravenous Q12H  . irbesartan  150 mg Oral Daily  . levothyroxine  50 mcg Oral QAC breakfast  . magnesium oxide  400 mg Oral Daily  . methylPREDNISolone (SOLU-MEDROL) injection  125 mg Intravenous Once  . metoprolol succinate  100 mg Oral Daily   And  . metoprolol succinate  50 mg Oral QHS  . verapamil  180 mg Oral BID   Continuous Infusions:  PRN Meds: acetaminophen **OR** acetaminophen  Allergies:    Allergies  Allergen Reactions  . Clindamycin Hcl     Lip and face swelling  . Augmentin [Amoxicillin-Pot Clavulanate] Diarrhea    Did it involve swelling of the face/tongue/throat, SOB, or low BP? No Did it involve sudden or severe rash/hives, skin peeling, or any reaction on the inside of your mouth or nose? No Did you need to seek medical attention at a hospital or doctor's office? No When did it last happen?10 + years If all above answers are "NO", may proceed with cephalosporin use.   . Codeine Nausea Only and Other (See Comments)    Hyperactivity  . Demerol [Meperidine] Nausea And Vomiting  . Sulfa Antibiotics Rash    Social History:   Social History   Socioeconomic History  . Marital status: Divorced    Spouse name: Not on file  . Number of children: Not on file  . Years of education: Not on file  . Highest education level: Not on file  Occupational History  . Not on file  Tobacco Use  . Smoking status: Former Research scientist (life sciences)  . Smokeless tobacco: Never Used  . Tobacco comment: 07/30/2016 "social smoker only; in college"  Substance and Sexual Activity  . Alcohol use: Yes    Alcohol/week: 2.0 standard drinks    Types: 1 Glasses of wine, 1 Standard drinks  or equivalent per week    Comment: 07/30/2016 "couple drinks/month"  . Drug use: No  . Sexual activity: Not Currently  Other Topics Concern  . Not on file  Social History Narrative  . Not on file   Social Determinants  of Health   Financial Resource Strain: Not on file  Food Insecurity: Not on file  Transportation Needs: Not on file  Physical Activity: Not on file  Stress: Not on file  Social Connections: Not on file  Intimate Partner Violence: Not on file    Family History:    Family History  Problem Relation Age of Onset  . Heart failure Mother   . Hypertension Mother   . Hypertension Father   . Emphysema Father      ROS:  Please see the history of present illness.   All other ROS reviewed and negative.     Physical Exam/Data:   Vitals:   05/31/20 0600 05/31/20 0615 05/31/20 0630 05/31/20 0636  BP: (!) 124/49 136/62 (!) 122/57 (!) 122/57  Pulse: 84 83 82 92  Resp: (!) 21 (!) 21 20 20   Temp:      TempSrc:      SpO2: 94% 95% 94% 96%  Weight:      Height:        Intake/Output Summary (Last 24 hours) at 05/31/2020 0744 Last data filed at 05/31/2020 0109 Gross per 24 hour  Intake -  Output 1200 ml  Net -1200 ml   Last 3 Weights 05/30/2020 05/24/2020 05/16/2020  Weight (lbs) 270 lb 274 lb 274 lb 6.4 oz  Weight (kg) 122.471 kg 124.286 kg 124.467 kg     Body mass index is 43.58 kg/m.  General:  Well nourished, well developed, in no acute distress HEENT: normal Lymph: no adenopathy Neck: no JVD Endocrine:  No thryomegaly Vascular: No carotid bruits; FA pulses 2+ bilaterally without bruits  Cardiac:  normal S1, S2; RRR; no murmur  Lungs:  clear to auscultation bilaterally, no wheezing, rhonchi or rales  Abd: soft, nontender, no hepatomegaly  Ext: no edema Musculoskeletal:  No deformities, BUE and BLE strength normal and equal Skin: warm and dry  Neuro:  CNs 2-12 intact, no focal abnormalities noted Psych:  Normal affect   EKG:  The EKG was personally  reviewed and demonstrates:  NSR with poor R wave progression in the anterior leads Telemetry:  Telemetry was personally reviewed and demonstrates:  NSR without significant ventricular ectopy  Relevant CV Studies:  Echo 07/08/2019 1. Left ventricular ejection fraction, by estimation, is 60 to 65%. The  left ventricle has normal function. The left ventricle has no regional  wall motion abnormalities. Left ventricular diastolic parameters are  consistent with Grade II diastolic  dysfunction (pseudonormalization). Elevated left ventricular end-diastolic  pressure. The average left ventricular global longitudinal strain is -20.5  %.  2. Right ventricular systolic function is normal. The right ventricular  size is normal. Tricuspid regurgitation signal is inadequate for assessing  PA pressure.  3. Left atrial size was moderately dilated.  4. The mitral valve is normal in structure. Mild mitral valve  regurgitation. No evidence of mitral stenosis.  5. The aortic valve is normal in structure. Aortic valve regurgitation is  moderate. No aortic stenosis is present. Aortic regurgitation PHT measures  341 msec.  6. The inferior vena cava is normal in size with greater than 50%  respiratory variability, suggesting right atrial pressure of 3 mmHg.   Comparison(s): 07/21/16 EF 55-60%.     Myoview 05/25/2020  There was no ST segment deviation noted during stress.  The study is normal.  This is a low risk study.   Normal pharmacologic nuclear stress test with no evidence for prior infarct or ischemia. LVEF not calculated due to atrial  fibrillation.    Laboratory Data:  High Sensitivity Troponin:  No results for input(s): TROPONINIHS in the last 720 hours.   Chemistry Recent Labs  Lab 05/25/20 0940 05/30/20 2050  NA 138 139  K 4.8 4.1  CL 101 104  CO2 21 21*  GLUCOSE 102* 117*  BUN 17 18  CREATININE 1.13* 1.11*  CALCIUM 8.7 8.3*  GFRNONAA 48* 52*  GFRAA 55*  --   ANIONGAP   --  14    No results for input(s): PROT, ALBUMIN, AST, ALT, ALKPHOS, BILITOT in the last 168 hours. Hematology Recent Labs  Lab 05/25/20 0940 05/30/20 2050  WBC 12.0* 17.5*  RBC 4.74 4.95  HGB 10.4* 10.7*  HCT 34.0 37.7  MCV 72* 76.2*  MCH 21.9* 21.6*  MCHC 30.6* 28.4*  RDW 16.5* 17.1*  PLT 396 382   BNP Recent Labs  Lab 05/30/20 2050  BNP 306.6*    DDimer No results for input(s): DDIMER in the last 168 hours.   Radiology/Studies:  DG Chest Port 1 View  Result Date: 05/30/2020 CLINICAL DATA:  Recent cardioversion for atrial fibrillation with wheezing and shortness of breath. EXAM: PORTABLE CHEST 1 VIEW COMPARISON:  July 30, 2016 FINDINGS: Mild to moderate severity diffusely increased interstitial lung markings are seen. This is increased in severity when compared to the prior study. Mild areas of atelectasis and/or infiltrate are also seen within the bilateral lung bases. There is no evidence of a pleural effusion or pneumothorax. The cardiac silhouette is moderately enlarged. The visualized skeletal structures are unremarkable. IMPRESSION: 1. Mild to moderate severity interstitial edema. 2. Mild bibasilar atelectasis and/or infiltrate. Electronically Signed   By: Virgina Norfolk M.D.   On: 05/30/2020 20:47     Assessment and Plan:   1. Acute systolic heart failure  - BNP 306.6. Likely related to recent persistent atrial fibrillation  - obtain echocardiogram  - given 40mg  IV lasix yesterday, now on 20mg  BID IV lasix. She usually takes 20mg  daily PO lasix at home. I/O -1200  - consider increase IV lasix to 40mg  BID. On exam, she continue to have decreased breath sound in bilateral bases, worse on the right side. Hopefully able to ambulate and D/C tomorrow back on home dose of lasix.    2. Leukocytosis: treated last week with abx for infected root canal, now better. Probably does not fully explain the current leukocytosis. Unclear if related to acute CHF. Denies any fever  or chill, COVID test negative.   3. Persistent atrial fibrillation  - symptomatic. Recently underwent DCCV on 1/25, however had recurrent afib  - had low risk myoview and started on flecainide and underwent repeat DCCV on 2/15  - continue BB, CCB, flecainide and eliquis   4. Obstructive sleep apnea on CPAP: use CPAP with nasal pillow at home, did not receive CPAP last night as patient is claustrophobic with hospital provided mask, we do not have nasal pillow like the one she use at home  5. Hypertension  6. Hyperlipidemia  7. Hypothyroidism  8. Stage II CKD   Risk Assessment/Risk Scores:        New York Heart Association (NYHA) Functional Class NYHA Class III  CHA2DS2-VASc Score = 4  This indicates a 4.8% annual risk of stroke. The patient's score is based upon: CHF History: Yes HTN History: Yes Diabetes History: No Stroke History: No Vascular Disease History: No Age Score: 1 Gender Score: 1         For questions or updates, please  contact [place] Please consult [email or web address] for contact info under    Hilbert Corrigan, [name]  05/31/2020 7:44 AM

## 2020-05-31 NOTE — Progress Notes (Signed)
Pt has home CPAP in the room.  Pt stated she doesn't need assistance.

## 2020-05-31 NOTE — Progress Notes (Signed)
RT talked to patient about wearing CPAP tonight. Patient wears CPAP at home with nasal pillows. RT informed patient that the hospital does not have nasal pillows, only nasal mask. Patient informed RT that she is claustrophobic and she is unsure if she could tolerate a mask but was willing to try. RT and patient tried the nasal mask with no success, too much for patient. Patient stated she would get someone to bring in her CPAP and nasal pillows.

## 2020-06-01 ENCOUNTER — Encounter (HOSPITAL_COMMUNITY): Payer: Self-pay | Admitting: Cardiovascular Disease

## 2020-06-01 ENCOUNTER — Inpatient Hospital Stay (HOSPITAL_COMMUNITY): Payer: PPO

## 2020-06-01 DIAGNOSIS — I4819 Other persistent atrial fibrillation: Secondary | ICD-10-CM | POA: Diagnosis not present

## 2020-06-01 DIAGNOSIS — N183 Chronic kidney disease, stage 3 unspecified: Secondary | ICD-10-CM | POA: Diagnosis not present

## 2020-06-01 DIAGNOSIS — G4733 Obstructive sleep apnea (adult) (pediatric): Secondary | ICD-10-CM | POA: Diagnosis not present

## 2020-06-01 DIAGNOSIS — I5021 Acute systolic (congestive) heart failure: Secondary | ICD-10-CM

## 2020-06-01 DIAGNOSIS — I5031 Acute diastolic (congestive) heart failure: Secondary | ICD-10-CM

## 2020-06-01 LAB — ECHOCARDIOGRAM COMPLETE
Area-P 1/2: 2.83 cm2
Height: 66 in
P 1/2 time: 682 msec
S' Lateral: 3.7 cm
Weight: 4278.69 oz

## 2020-06-01 LAB — BASIC METABOLIC PANEL
Anion gap: 10 (ref 5–15)
BUN: 17 mg/dL (ref 8–23)
CO2: 26 mmol/L (ref 22–32)
Calcium: 8.5 mg/dL — ABNORMAL LOW (ref 8.9–10.3)
Chloride: 103 mmol/L (ref 98–111)
Creatinine, Ser: 1.37 mg/dL — ABNORMAL HIGH (ref 0.44–1.00)
GFR, Estimated: 41 mL/min — ABNORMAL LOW (ref >60)
Glucose, Bld: 95 mg/dL (ref 70–99)
Potassium: 4.5 mmol/L (ref 3.5–5.1)
Sodium: 139 mmol/L (ref 135–145)

## 2020-06-01 LAB — MAGNESIUM: Magnesium: 1.7 mg/dL (ref 1.7–2.4)

## 2020-06-01 NOTE — Progress Notes (Signed)
PROGRESS NOTE    Leslie [name]  YQI:[id number] DOB: 01-26-1946 DOA: 05/30/2020 PCP: Glenis Smoker, MD   Brief Narrative:  Leslie [name] is a 75 y.o. female with history of paroxysmal atrial fibrillation status post cardioversion this morning with history of hypertension hypothyroidism hyperlipidemia presents to the ER after patient became more short of breath towards the evening. Patient states even prior to the cardioversion patient has been mildly short of breath. Has been taking diuretics at home. Patient has been cardioverted for the second time in the last 3 weeks. Has been compliant with Eliquis. Patient states towards the evening patient became more acutely short of breath even at rest but no chest pain or productive cough.  ED Course: In the ER chest x-ray shows features concerning for fluid overload and BNP was around 300. WBC count was 17.5. Patient was given Lasix 40 mg IV and admitted for acute CHF. Covid test was negative.   Assessment & Plan:  Acute systolic heart failure: -Patient presented with signs of fluid overload with shortness of breath.  BNP: 306.6.  Reviewed chest x-ray.  TSH: WNL. -Likely related to persistent A. Fib underwent cardioversion on February 15.   -Echo is still pending.  Patient was given 40 IV Lasix in ED. -Continue Lasix 40 IV twice daily as per cardiology -She is -2.6 L and -3 lb since admission -Strict INO's and daily weight. -Keep potassium above 4 and magnesium above 2 -Continue metoprolol, irbesartan  Persistent A. Fib: -Underwent DCCV on 1/25 but failed and then repeat DCCV on 02/15 which was successful. -Continue flecainide, Eliquis, verapamil, metoprolol  Hypomagnesemia: Replenished.  Repeat magnesium level tomorrow a.m.  Hyperlipidemia: Continue statin  Hypertension: Stable -Continue irbesartan, metoprolol, verapamil  Leukocytosis: Chronic -Patient remained afebrile.  No signs of infection.  Leukocytosis is  improving -COVID-19 is negative.  She was treated last week with antibiotics for infection.  Patient off antibiotics at this time.  Hypothyroidism: TSH: WNL.  Continue levothyroxine  CKD stage IIa: -Renal function slightly declined this morning.  Creatinine went up from 1.1-1.3.  Continue diuresis and monitor kidney function closely.  OSA on CPAP  Morbid obesity with BMI of 43: -Diet modification/exercise and weight loss recommended.  Anemia of chronic disease: At baseline.  Continue to monitor  Patient is improving overall.  Will consult PT. awaiting echo result.  Continue IV diuresis-likely transition to p.o. tomorrow.  DVT prophylaxis: Eliquis Code Status: Full code Family Communication:  None present at bedside.  Plan of care discussed with patient in length and she verbalized understanding and agreed with it. Disposition Plan: Home in 1 to 2 days  Consultants:   Cardiology  Procedures:  Echo Antimicrobials:   None  Status is: Observation   Dispo: The patient is from: Home              Anticipated d/c is to: Home              Anticipated d/c date is: 1 day              Patient currently is not medically stable to d/c.   Difficult to place patient No    Subjective: Patient seen and examined.  Tells me that her breathing and leg swelling has improved however she feels weak this morning.  She denies chest pain, palpitation, headache, blurry vision, nausea or vomiting.  Objective: Vitals:   05/31/20 1937 05/31/20 2341 06/01/20 0438 06/01/20 1013  BP: (!) 156/60 119/63 120/62 (!) 128/52  Pulse: 81 79 67 73  Resp: 20 18 18    Temp: 98 F (36.7 C) 97.8 F (36.6 C) 98.3 F (36.8 C)   TempSrc: Oral Oral Oral   SpO2: 95% 93% 93%   Weight:   121.3 kg   Height:        Intake/Output Summary (Last 24 hours) at 06/01/2020 1114 Last data filed at 05/31/2020 2205 Gross per 24 hour  Intake 240 ml  Output 800 ml  Net -560 ml   Filed Weights   05/30/20 2029 06/01/20  0438  Weight: 122.5 kg 121.3 kg    Examination:  General exam: Appears calm and comfortable, on room air, communicating well, obese Respiratory system: Rales noted on the bases.  No wheezing or crackles. Cardiovascular system: S1 & S2 heard, RRR. No JVD, murmurs, rubs, gallops or clicks.  No pitting edema Gastrointestinal system: Abdomen is nondistended, soft and nontender. No organomegaly or masses felt. Normal bowel sounds heard. Central nervous system: Alert and oriented. No focal neurological deficits. Extremities: Symmetric 5 x 5 power. Skin: No rashes, lesions or ulcers Psychiatry: Judgement and insight appear normal. Mood & affect appropriate.    Data Reviewed: I have personally reviewed following labs and imaging studies  CBC: Recent Labs  Lab 05/30/20 2050 05/31/20 0500  WBC 17.5* 12.6*  NEUTROABS 14.9*  --   HGB 10.7* 9.9*  HCT 37.7 32.8*  MCV 76.2* 73.5*  PLT 382 562   Basic Metabolic Panel: Recent Labs  Lab 05/30/20 2050 05/31/20 0500 06/01/20 0313  NA 139 141 139  K 4.1 3.5 4.5  CL 104 105 103  CO2 21* 23 26  GLUCOSE 117* 111* 95  BUN 18 13 17   CREATININE 1.11* 1.14* 1.37*  CALCIUM 8.3* 8.5* 8.5*  MG  --   --  1.7   GFR: Estimated Creatinine Clearance: 47.8 mL/min (A) (by C-G formula based on SCr of 1.37 mg/dL (H)). Liver Function Tests: No results for input(s): AST, ALT, ALKPHOS, BILITOT, PROT, ALBUMIN in the last 168 hours. No results for input(s): LIPASE, AMYLASE in the last 168 hours. No results for input(s): AMMONIA in the last 168 hours. Coagulation Profile: No results for input(s): INR, PROTIME in the last 168 hours. Cardiac Enzymes: No results for input(s): CKTOTAL, CKMB, CKMBINDEX, TROPONINI in the last 168 hours. BNP (last 3 results) No results for input(s): PROBNP in the last 8760 hours. HbA1C: No results for input(s): HGBA1C in the last 72 hours. CBG: No results for input(s): GLUCAP in the last 168 hours. Lipid Profile: No  results for input(s): CHOL, HDL, LDLCALC, TRIG, CHOLHDL, LDLDIRECT in the last 72 hours. Thyroid Function Tests: Recent Labs    05/31/20 0500  TSH 3.410   Anemia Panel: No results for input(s): VITAMINB12, FOLATE, FERRITIN, TIBC, IRON, RETICCTPCT in the last 72 hours. Sepsis Labs: No results for input(s): PROCALCITON, LATICACIDVEN in the last 168 hours.  Recent Results (from the past 240 hour(s))  SARS CORONAVIRUS 2 (TAT 6-24 HRS) Nasopharyngeal Nasopharyngeal Swab     Status: None   Collection Time: 05/26/20  2:17 PM   Specimen: Nasopharyngeal Swab  Result Value Ref Range Status   SARS Coronavirus 2 NEGATIVE NEGATIVE Final    Comment: (NOTE) SARS-CoV-2 target nucleic acids are NOT DETECTED.  The SARS-CoV-2 RNA is generally detectable in upper and lower respiratory specimens during the acute phase of infection. Negative results do not preclude SARS-CoV-2 infection, do not rule out co-infections with other pathogens, and should not be used as the  sole basis for treatment or other patient management decisions. Negative results must be combined with clinical observations, patient history, and epidemiological information. The expected result is Negative.  Fact Sheet for Patients: SugarRoll.be  Fact Sheet for Healthcare Providers: https://www.woods-mathews.com/  This test is not yet approved or cleared by the [place] FDA and  has been authorized for detection and/or diagnosis of SARS-CoV-2 by FDA under an Emergency Use Authorization (EUA). This EUA will remain  in effect (meaning this test can be used) for the duration of the COVID-19 declaration under Se ction 564(b)(1) of the Act, 21 U.S.C. section 360bbb-3(b)(1), unless the authorization is terminated or revoked sooner.  Performed at [facility], [place] [address] [place], [place] [address]   SARS CORONAVIRUS 2 (TAT 6-24 HRS) Nasopharyngeal Nasopharyngeal Swab     Status:  None   Collection Time: 05/30/20 10:10 PM   Specimen: Nasopharyngeal Swab  Result Value Ref Range Status   SARS Coronavirus 2 NEGATIVE NEGATIVE Final    Comment: (NOTE) SARS-CoV-2 target nucleic acids are NOT DETECTED.  The SARS-CoV-2 RNA is generally detectable in upper and lower respiratory specimens during the acute phase of infection. Negative results do not preclude SARS-CoV-2 infection, do not rule out co-infections with other pathogens, and should not be used as the sole basis for treatment or other patient management decisions. Negative results must be combined with clinical observations, patient history, and epidemiological information. The expected result is Negative.  Fact Sheet for Patients: SugarRoll.be  Fact Sheet for Healthcare Providers: https://www.woods-mathews.com/  This test is not yet approved or cleared by the [place] FDA and  has been authorized for detection and/or diagnosis of SARS-CoV-2 by FDA under an Emergency Use Authorization (EUA). This EUA will remain  in effect (meaning this test can be used) for the duration of the COVID-19 declaration under Se ction 564(b)(1) of the Act, 21 U.S.C. section 360bbb-3(b)(1), unless the authorization is terminated or revoked sooner.  Performed at [facility], [place] [address] [place], [place] [address]       Radiology Studies: DG Chest Port 1 View  Result Date: 05/30/2020 CLINICAL DATA:  Recent cardioversion for atrial fibrillation with wheezing and shortness of breath. EXAM: PORTABLE CHEST 1 VIEW COMPARISON:  July 30, 2016 FINDINGS: Mild to moderate severity diffusely increased interstitial lung markings are seen. This is increased in severity when compared to the prior study. Mild areas of atelectasis and/or infiltrate are also seen within the bilateral lung bases. There is no evidence of a pleural effusion or pneumothorax. The cardiac silhouette is moderately  enlarged. The visualized skeletal structures are unremarkable. IMPRESSION: 1. Mild to moderate severity interstitial edema. 2. Mild bibasilar atelectasis and/or infiltrate. Electronically Signed   By: Virgina Norfolk M.D.   On: 05/30/2020 20:47    Scheduled Meds: . apixaban  5 mg Oral BID  . atorvastatin  10 mg Oral Daily  . EPINEPHrine  0.3 mg Intramuscular Once  . flecainide  50 mg Oral BID  . furosemide  40 mg Intravenous Q12H  . irbesartan  150 mg Oral Daily  . levothyroxine  50 mcg Oral QAC breakfast  . magnesium oxide  400 mg Oral Daily  . methylPREDNISolone (SOLU-MEDROL) injection  125 mg Intravenous Once  . metoprolol succinate  100 mg Oral Daily   And  . metoprolol succinate  50 mg Oral QHS  . pantoprazole  40 mg Oral Daily  . verapamil  180 mg Oral BID   Continuous Infusions:  LOS: 1 day   Time spent: 35 minutes   Pradyun Ishman Loann Quill, MD [facility]  If 7PM-7AM, please contact night-coverage [email or web address] 06/01/2020, 11:14 AM

## 2020-06-01 NOTE — Discharge Instructions (Signed)

## 2020-06-01 NOTE — Progress Notes (Signed)
  Echocardiogram 2D Echocardiogram has been performed.  Jennette Dubin 06/01/2020, 2:22 PM

## 2020-06-01 NOTE — Evaluation (Signed)
Physical Therapy Evaluation Patient Details Name: Leslie [name] MRN: [id number] DOB: [birth year] Today's Date: 06/01/2020   History of Present Illness  Pt is a 75 y.o. female who presented to ED with worsening SOB. She was diagnosed with A-fib post cardioversion on 2/15. PMH significant for CHF, HTN, hypothyroidism, HLD, CKD stage III, and recent root canal (on antibiotics).    Clinical Impression  Pt received sitting on EOB with NT, cooperative, pleasant, and motivated to work with PT. Pt lives alone and was independent with all mobility prior to hospital admission. Stated she has multiple friends and family who can provide  Intermittent assistance once she leaves hospital. Min guard for transfers and ambulation ~120 ft with RW. Suspect pt can walk without AD and min guard for short distances, but will need RW for longer distances. Maintained sinus rhythm throughout session, no signs of A-fib. VSS on RA. Recommend cardiac rehab if pt meets qualifications, otherwise OP PT. Pt agreeable to this plan. Left in chair with all needs met and call bell within reach.    Follow Up Recommendations Outpatient PT (Cardiac Rehab if qualifies)    Equipment Recommendations  Rolling walker with 5" wheels;3in1 (PT)    Recommendations for Other Services       Precautions / Restrictions Precautions Precautions: Fall Restrictions Weight Bearing Restrictions: No      Mobility  Bed Mobility               General bed mobility comments: Pt received on sitting on EOB    Transfers Overall transfer level: Needs assistance Equipment used: Rolling walker (2 wheeled) Transfers: Sit to/from Stand Sit to Stand: Min guard         General transfer comment: Cueing to push from EOB, guarding to ensure she didn't push walker out too far in front of her coming into stand  Ambulation/Gait   Gait Distance (Feet): 120 Feet Assistive device: Rolling walker (2 wheeled) Gait Pattern/deviations: Step-to  pattern;Decreased step length - right;Decreased step length - left Gait velocity: Decreased   General Gait Details: Steady with RW, occassional cueing to keep RW at appropriate, safe distance  Stairs            Wheelchair Mobility    Modified Rankin (Stroke Patients Only)       Balance Overall balance assessment: Needs assistance Sitting-balance support: Feet supported;No upper extremity supported Sitting balance-Leahy Scale: Good Sitting balance - Comments: Able to assist with donning shoes in sitting     Standing balance-Leahy Scale: Fair Standing balance comment: Needs UE support for ambulating longer distances                             Pertinent Vitals/Pain Pain Assessment: 0-10 Pain Score: 2  Pain Location: Knees Pain Descriptors / Indicators: Aching;Discomfort Pain Intervention(s): Monitored during session    Home Living Family/patient expects to be discharged to:: Private residence Living Arrangements: Alone Available Help at Discharge: Family;Available PRN/intermittently;Friend(s) Type of Home:  (1 story condo) Home Access: Stairs to enter Entrance Stairs-Rails: None Entrance Stairs-Number of Steps: 2 Home Layout: One level Home Equipment: Shower seat - built in;Hand held shower head      Prior Function Level of Independence: Independent               Hand Dominance   Dominant Hand: Right    Extremity/Trunk Assessment   Upper Extremity Assessment Upper Extremity Assessment: Overall WFL for tasks assessed  Lower Extremity Assessment Lower Extremity Assessment: Overall WFL for tasks assessed    Cervical / Trunk Assessment Cervical / Trunk Assessment: Normal  Communication   Communication: No difficulties  Cognition Arousal/Alertness: Awake/alert Behavior During Therapy: WFL for tasks assessed/performed Overall Cognitive Status: Within Functional Limits for tasks assessed                                  General Comments: Pleasant, cooperative, motivated      General Comments General comments (skin integrity, edema, etc.): VSS on RA. Maintained sinus rhythm, no evidence of A-fib during mobility.    Exercises     Assessment/Plan    PT Assessment Patient needs continued PT services  PT Problem List Decreased strength;Decreased mobility;Decreased coordination;Decreased activity tolerance;Decreased balance;Decreased knowledge of use of DME;Cardiopulmonary status limiting activity       PT Treatment Interventions DME instruction;Therapeutic exercise;Gait training;Balance training;Stair training;Neuromuscular re-education;Functional mobility training;Therapeutic activities;Patient/family education    PT Goals (Current goals can be found in the Care Plan section)  Acute Rehab PT Goals Patient Stated Goal: get stronger, return home PT Goal Formulation: With patient Time For Goal Achievement: 06/15/20 Potential to Achieve Goals: Good    Frequency Min 3X/week   Barriers to discharge        Co-evaluation               AM-PAC PT "6 Clicks" Mobility  Outcome Measure Help needed turning from your back to your side while in a flat bed without using bedrails?: None Help needed moving from lying on your back to sitting on the side of a flat bed without using bedrails?: None Help needed moving to and from a bed to a chair (including a wheelchair)?: A Little Help needed standing up from a chair using your arms (e.g., wheelchair or bedside chair)?: A Little Help needed to walk in hospital room?: A Little Help needed climbing 3-5 steps with a railing? : A Little 6 Click Score: 20    End of Session Equipment Utilized During Treatment: Gait belt Activity Tolerance: Patient tolerated treatment well Patient left: in chair;with call bell/phone within reach Nurse Communication: Mobility status PT Visit Diagnosis: Unsteadiness on feet (R26.81);Pain;Muscle weakness (generalized) (M62.81)     Time:  -      Charges:              Rosita Kea, SPT

## 2020-06-01 NOTE — Progress Notes (Signed)
Progress Note  Patient Name: Leslie [name] Date of Encounter: 06/01/2020  [facility] Cardiologist: Sanda Klein, MD   Subjective   She notes significant improvement in her breathing compared to yesterday, though still having conversational dyspnea and DOE. She is looking forward to working with PT today and asks about the possibility of going to cardiac rehab. I called cardiac rehab and guidelines for our non-CAD patients would be CHF with EF <35%. Will await echo today to determine appropriateness.   Inpatient Medications    Scheduled Meds: . apixaban  5 mg Oral BID  . atorvastatin  10 mg Oral Daily  . EPINEPHrine  0.3 mg Intramuscular Once  . flecainide  50 mg Oral BID  . furosemide  40 mg Intravenous Q12H  . irbesartan  150 mg Oral Daily  . levothyroxine  50 mcg Oral QAC breakfast  . magnesium oxide  400 mg Oral Daily  . methylPREDNISolone (SOLU-MEDROL) injection  125 mg Intravenous Once  . metoprolol succinate  100 mg Oral Daily   And  . metoprolol succinate  50 mg Oral QHS  . pantoprazole  40 mg Oral Daily  . verapamil  180 mg Oral BID   Continuous Infusions:  PRN Meds: acetaminophen **OR** acetaminophen   Vital Signs    Vitals:   05/31/20 1801 05/31/20 1937 05/31/20 2341 06/01/20 0438  BP: (!) 113/56 (!) 156/60 119/63 120/62  Pulse: 81 81 79 67  Resp: 16 20 18 18   Temp: 97.9 F (36.6 C) 98 F (36.7 C) 97.8 F (36.6 C) 98.3 F (36.8 C)  TempSrc: Oral Oral Oral Oral  SpO2: 93% 95% 93% 93%  Weight:    121.3 kg  Height:        Intake/Output Summary (Last 24 hours) at 06/01/2020 0954 Last data filed at 05/31/2020 2205 Gross per 24 hour  Intake 240 ml  Output 1200 ml  Net -960 ml   Last 3 Weights 06/01/2020 05/30/2020 05/24/2020  Weight (lbs) 267 lb 6.7 oz 270 lb 274 lb  Weight (kg) 121.3 kg 122.471 kg 124.286 kg      Telemetry    Sinus rhythm - Personally Reviewed  ECG    No new tracings - Personally Reviewed  Physical Exam   GEN: No  acute distress.   Neck: No JVD Cardiac: RRR, no murmurs, rubs, or gallops.  Respiratory: Clear to auscultation bilaterally. GI: Soft, nontender, non-distended  MS: trace RLE edema (chronic); No deformity. Neuro:  Nonfocal  Psych: Normal affect   Labs    High Sensitivity Troponin:  No results for input(s): TROPONINIHS in the last 720 hours.    Chemistry Recent Labs  Lab 05/30/20 2050 05/31/20 0500 06/01/20 0313  NA 139 141 139  K 4.1 3.5 4.5  CL 104 105 103  CO2 21* 23 26  GLUCOSE 117* 111* 95  BUN 18 13 17   CREATININE 1.11* 1.14* 1.37*  CALCIUM 8.3* 8.5* 8.5*  GFRNONAA 52* 51* 41*  ANIONGAP 14 13 10      Hematology Recent Labs  Lab 05/30/20 2050 05/31/20 0500  WBC 17.5* 12.6*  RBC 4.95 4.46  HGB 10.7* 9.9*  HCT 37.7 32.8*  MCV 76.2* 73.5*  MCH 21.6* 22.2*  MCHC 28.4* 30.2  RDW 17.1* 17.0*  PLT 382 360    BNP Recent Labs  Lab 05/30/20 2050  BNP 306.6*     DDimer No results for input(s): DDIMER in the last 168 hours.   Radiology    DG Chest [place] 1  View  Result Date: 05/30/2020 CLINICAL DATA:  Recent cardioversion for atrial fibrillation with wheezing and shortness of breath. EXAM: PORTABLE CHEST 1 VIEW COMPARISON:  July 30, 2016 FINDINGS: Mild to moderate severity diffusely increased interstitial lung markings are seen. This is increased in severity when compared to the prior study. Mild areas of atelectasis and/or infiltrate are also seen within the bilateral lung bases. There is no evidence of a pleural effusion or pneumothorax. The cardiac silhouette is moderately enlarged. The visualized skeletal structures are unremarkable. IMPRESSION: 1. Mild to moderate severity interstitial edema. 2. Mild bibasilar atelectasis and/or infiltrate. Electronically Signed   By: Virgina Norfolk M.D.   On: 05/30/2020 20:47    Cardiac Studies   NST 05/25/20:  There was no ST segment deviation noted during stress.  The study is normal.  This is a low risk study.    Normal pharmacologic nuclear stress test with no evidence for prior infarct or ischemia. LVEF not calculated due to atrial fibrillation.   Echocardiogram 06/2019: 1. Left ventricular ejection fraction, by estimation, is 60 to 65%. The  left ventricle has normal function. The left ventricle has no regional  wall motion abnormalities. Left ventricular diastolic parameters are  consistent with Grade II diastolic  dysfunction (pseudonormalization). Elevated left ventricular end-diastolic  pressure. The average left ventricular global longitudinal strain is -20.5  %.  2. Right ventricular systolic function is normal. The right ventricular  size is normal. Tricuspid regurgitation signal is inadequate for assessing  PA pressure.  3. Left atrial size was moderately dilated.  4. The mitral valve is normal in structure. Mild mitral valve  regurgitation. No evidence of mitral stenosis.  5. The aortic valve is normal in structure. Aortic valve regurgitation is  moderate. No aortic stenosis is present. Aortic regurgitation PHT measures  341 msec.  6. The inferior vena cava is normal in size with greater than 50%  respiratory variability, suggesting right atrial pressure of 3 mmHg.   Comparison(s): 07/21/16 EF 55-60%.   Patient Profile     75 y.o. female with a PMH of obesity, OSA on CPAP therapy, persistent atrial fibrillation, HTN, HLD, hypothyroidism and stage II CKD who is being followed by cardiology for the evaluation of acute CHF.  Assessment & Plan    1. Acute CHF: patient presented with progressive dyspnea following successful DCCV earlier 03/29/21. Her last echocardiogram was 06/2019 with EF 60-65% and indeterminate LV diastolic function in the setting of Afib. Possibly she has developed LV systolic dysfunction since that time. BNP elevated to 300s on admission. CXR with mild-moderate interstitial edema. She was started on IV lasix 40mg  BID with UOP net -1.4L in the past 24 hours with  several unmeasured UOP occurrences and net -2.6L this admission. Weight 270lbs on admission, down to 267.4lbs today. Cr bumped from 1.14 to 1.37 today - Await echocardiogram results - Will continue IV lasix today given ongoing faint crackles at R lung base though anticipate transition to po in the next 24 hours.  - Continue metoprolol succinate and irbesartan - Continue to monitor strict I&Os and daily weights - Continue to monitor electrolytes and replete as needed to maintain K >4, Mg >2  2. Persistent atrial fibrillation: initially diagnosed following 2nd COVID vaccine in 2021. She underwent failed DCCV and was subsequently started on flecainide following a negative NST. She underwent repeat DCCV 05/30/20 which was successful. She continues to maintain sinus rhythm this admission - Continue metoprolol and verapamil for rate control - Continue flecainide for  rhythm control - Continue Eliquis for stroke ppx  3. HTN: BP is generally at goal - Continue metoprolol, irbesartan, and verapamil  4. HLD: no recent lipids on file - Continue atorvastatin      For questions or updates, please contact [place] Please consult [email or web address] for contact info under        Signed, Abigail Butts, PA-C  06/01/2020, 9:54 AM

## 2020-06-01 NOTE — Progress Notes (Signed)
Pt has home CPAP.  

## 2020-06-02 DIAGNOSIS — I5031 Acute diastolic (congestive) heart failure: Secondary | ICD-10-CM | POA: Diagnosis not present

## 2020-06-02 DIAGNOSIS — I4819 Other persistent atrial fibrillation: Secondary | ICD-10-CM | POA: Diagnosis not present

## 2020-06-02 LAB — IRON AND TIBC
Iron: 24 ug/dL — ABNORMAL LOW (ref 28–170)
Saturation Ratios: 5 % — ABNORMAL LOW (ref 10.4–31.8)
TIBC: 437 ug/dL (ref 250–450)
UIBC: 413 ug/dL

## 2020-06-02 LAB — CBC
HCT: 35.7 % — ABNORMAL LOW (ref 36.0–46.0)
Hemoglobin: 10.4 g/dL — ABNORMAL LOW (ref 12.0–15.0)
MCH: 21.6 pg — ABNORMAL LOW (ref 26.0–34.0)
MCHC: 29.1 g/dL — ABNORMAL LOW (ref 30.0–36.0)
MCV: 74.1 fL — ABNORMAL LOW (ref 80.0–100.0)
Platelets: 405 10*3/uL — ABNORMAL HIGH (ref 150–400)
RBC: 4.82 MIL/uL (ref 3.87–5.11)
RDW: 17.1 % — ABNORMAL HIGH (ref 11.5–15.5)
WBC: 12.2 10*3/uL — ABNORMAL HIGH (ref 4.0–10.5)
nRBC: 0 % (ref 0.0–0.2)

## 2020-06-02 LAB — BASIC METABOLIC PANEL
Anion gap: 11 (ref 5–15)
BUN: 28 mg/dL — ABNORMAL HIGH (ref 8–23)
CO2: 26 mmol/L (ref 22–32)
Calcium: 8.5 mg/dL — ABNORMAL LOW (ref 8.9–10.3)
Chloride: 100 mmol/L (ref 98–111)
Creatinine, Ser: 1.64 mg/dL — ABNORMAL HIGH (ref 0.44–1.00)
GFR, Estimated: 33 mL/min — ABNORMAL LOW (ref >60)
Glucose, Bld: 91 mg/dL (ref 70–99)
Potassium: 4.2 mmol/L (ref 3.5–5.1)
Sodium: 137 mmol/L (ref 135–145)

## 2020-06-02 LAB — FERRITIN: Ferritin: 37 ng/mL (ref 11–307)

## 2020-06-02 LAB — MAGNESIUM: Magnesium: 1.7 mg/dL (ref 1.7–2.4)

## 2020-06-02 MED ORDER — MAGNESIUM SULFATE 2 GM/50ML IV SOLN
2.0000 g | Freq: Once | INTRAVENOUS | Status: AC
  Administered 2020-06-02: 2 g via INTRAVENOUS
  Filled 2020-06-02: qty 50

## 2020-06-02 NOTE — Progress Notes (Signed)
Assisted pt with setting up home CPAP (with nasal pillows)on bedside table. Water chamber filled with sterile water and placed in machine per pt's request. Pt can place on herself when ready for bed. Advised pt to notify for RT if any further assistance with CPAP is required.

## 2020-06-02 NOTE — Progress Notes (Addendum)
Heart Failure Stewardship Pharmacist Progress Note   PCP: Glenis Smoker, MD PCP-Cardiologist: Sanda Klein, MD    HPI:   75 yo F with PMH of obesity, OSA on CPAP, HFpEF, persistent afib, HTN, HLD, hypothyroidism, and CKD II. She presented to the ED on 05/30/20 with shortness of breath. She was then admitted for acute on chronic diastolic CHF. An ECHO was done on 06/01/20 and LVEF was 55-60% (was 60-65% in March 2021).   Current HF Medications: Furosemide 40 mg IV BID Metoprolol XL 100 mg daily Irbesartan 150 mg daily  Prior to admission HF Medications: Furosemide 20 mg daily Metoprolol XL 100 mg qAM and 50 mg qPM Irbesartan 150 mg daily  Pertinent Lab Values: . Serum creatinine 1.64, BUN 28, Potassium 4.2, Sodium 137, BNP 306, Magnesium 1.7   Vital Signs: . Weight: 263 lbs (admission weight: 270 lbs) . Blood pressure: 120/60s  . Heart rate: 70s   Medication Assistance / Insurance Benefits Check: Does the patient have prescription insurance?  Yes Type of insurance plan: [employer]  Does the patient qualify for medication assistance through manufacturers or grants?   Pending . Eligible grants and/or patient assistance programs: pending . Medication assistance applications in progress: none  . Medication assistance applications approved: none Approved medication assistance renewals will be completed by: Marvin:  Prior to admission outpatient pharmacy: [facility] Is the patient willing to use [facility] at discharge? Yes Is the patient willing to transition their outpatient pharmacy to utilize a [facility] outpatient pharmacy?   Pending    Assessment: 1. Acute on chronic diastolic CHF (EF 14-43%). NYHA class II symptoms. - Agree with stopping furosemide 40 mg IV BID - Continue metoprolol XL 100 mg daily - Continue irbesartan 150 mg daily - Consider starting Jardiance 10 mg daily for HFpEF once SCr improved   Plan: 1)  Medication changes recommended at this time: - Add Jardiance 10 mg daily once SCr improved  2) Patient assistance application(s): - Vania Rea is preferred [id number] on patient's insurance ([employer] requires prior authorization). Copay $45 per month for Jardiance.  - If copay is too high, can enroll in patient assistance to bring copay down to $0 per month  3)  Education  - To be completed prior to discharge  Kerby Nora, PharmD, BCPS Heart Failure Stewardship Pharmacist Phone [phone]

## 2020-06-02 NOTE — Progress Notes (Signed)
PROGRESS NOTE    ANANDI ABRAMO  VOJ:[id number] DOB: 19-Aug-1945 DOA: 05/30/2020 PCP: Glenis Smoker, MD   Brief Narrative:  Leslie [name] is a 75 y.o. female with history of paroxysmal atrial fibrillation status post cardioversion this morning with history of hypertension hypothyroidism hyperlipidemia presents to the ER after patient became more short of breath towards the evening. Patient states even prior to the cardioversion patient has been mildly short of breath. Has been taking diuretics at home. Patient has been cardioverted for the second time in the last 3 weeks. Has been compliant with Eliquis. Patient states towards the evening patient became more acutely short of breath even at rest but no chest pain or productive cough.  ED Course: In the ER chest x-ray shows features concerning for fluid overload and BNP was around 300. WBC count was 17.5. Patient was given Lasix 40 mg IV and admitted for acute CHF. Covid test was negative.   Assessment & Plan:  Acute diastolic heart failure: -Patient presented with signs of fluid overload with shortness of breath.  BNP: 306.6.  Reviewed chest x-ray.  TSH: WNL. -Likely related to persistent A. Fib underwent cardioversion on February 15. -Echo shows ejection fraction of 55 to 60% with grade 2 diastolic dysfunction -Patient started on Lasix 40 mg IV twice daily on admission-due to worsening renal function IV Lasix discontinued as per cardiology-recommended resuming p.o. Lasix 40 mg daily tomorrow if renal function improves. -She is - 3.2 L and -7 lb since admission -Strict INO's and daily weight. -Keep potassium above 4 and magnesium above 2 -Continue metoprolol, irbesartan -Appreciate cardiology help -PT recommended outpatient PT  Persistent A. Fib: -Underwent DCCV on 1/25 but failed and then repeat DCCV on 02/15 which was successful. -Continue flecainide, Eliquis, verapamil, metoprolol  Hypomagnesemia: Replenished.  Repeat  magnesium level tomorrow a.m.  Hyperlipidemia: Continue statin  Hypertension: Stable -Continue irbesartan, metoprolol, verapamil  Leukocytosis: Chronic -Patient remained afebrile.  No signs of infection.  Leukocytosis is improving -COVID-19 is negative.  She was treated last week with antibiotics for infection.  Patient off antibiotics at this time.  Hypothyroidism: TSH: WNL.  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  Creatinine went up from 1.1-1.3-1.6.  -Hold IV Lasix today, resume p.o. Lasix tomorrow if renal function improves.  Repeat BMP tomorrow a.m.  OSA: On CPAP  Morbid obesity with BMI of 43: -Diet modification/exercise and weight loss recommended.  Microcytic anemia: H&H at baseline.  Check iron studies.  Continue to monitor  Thrombocytosis: Platelet 405-likely reactive  DVT prophylaxis: Eliquis Code Status: Full code Family Communication:  None present at bedside.  Plan of care discussed with patient in length and she verbalized understanding and agreed with it. Disposition Plan: Home in 1 to 2 days  Consultants:   Cardiology  Procedures:  Echo Antimicrobials:   None  Status is: Observation   Dispo: The patient is from: Home              Anticipated d/c is to: Home              Anticipated d/c date is: 1 day              Patient currently is not medically stable to d/c.   Difficult to place patient No    Subjective: Patient seen and examined.  Tells me that she feels better this morning, denies any new complaints.  Denies chest pain, shortness of breath, leg  swelling, orthopnea or PND.  Objective: Vitals:   06/01/20 1718 06/01/20 2318 06/02/20 0526 06/02/20 1150  BP: 126/62 130/62 125/66 (!) 106/59  Pulse: 70 74 68 67  Resp: 16 17 19 18   Temp: 98.8 F (37.1 C) 98.4 F (36.9 C) 98 F (36.7 C) 98 F (36.7 C)  TempSrc: Oral Oral Oral Oral  SpO2: 92% 91% 92% 98%  Weight:   119.7 kg    Height:        Intake/Output Summary (Last 24 hours) at 06/02/2020 1207 Last data filed at 06/02/2020 0926 Gross per 24 hour  Intake 120 ml  Output 700 ml  Net -580 ml   Filed Weights   05/30/20 2029 06/01/20 0438 06/02/20 0526  Weight: 122.5 kg 121.3 kg 119.7 kg    Examination:  General exam: Appears calm and comfortable, on room air, communicating well, obese Respiratory system: No wheezing, rhonchi or crackles  cardiovascular system: S1 & S2 heard, RRR. No JVD, murmurs, rubs, gallops or clicks.  No pitting edema Gastrointestinal system: Abdomen is nondistended, soft and nontender. No organomegaly or masses felt. Normal bowel sounds heard. Central nervous system: Alert and oriented. No focal neurological deficits. Extremities: Symmetric 5 x 5 power. Skin: No rashes, lesions or ulcers Psychiatry: Judgement and insight appear normal. Mood & affect appropriate.    Data Reviewed: I have personally reviewed following labs and imaging studies  CBC: Recent Labs  Lab 05/30/20 2050 05/31/20 0500 06/02/20 0009  WBC 17.5* 12.6* 12.2*  NEUTROABS 14.9*  --   --   HGB 10.7* 9.9* 10.4*  HCT 37.7 32.8* 35.7*  MCV 76.2* 73.5* 74.1*  PLT 382 360 338*   Basic Metabolic Panel: Recent Labs  Lab 05/30/20 2050 05/31/20 0500 06/01/20 0313 06/02/20 0009  NA 139 141 139 137  K 4.1 3.5 4.5 4.2  CL 104 105 103 100  CO2 21* 23 26 26   GLUCOSE 117* 111* 95 91  BUN 18 13 17  28*  CREATININE 1.11* 1.14* 1.37* 1.64*  CALCIUM 8.3* 8.5* 8.5* 8.5*  MG  --   --  1.7 1.7   GFR: Estimated Creatinine Clearance: 39.7 mL/min (A) (by C-G formula based on SCr of 1.64 mg/dL (H)). Liver Function Tests: No results for input(s): AST, ALT, ALKPHOS, BILITOT, PROT, ALBUMIN in the last 168 hours. No results for input(s): LIPASE, AMYLASE in the last 168 hours. No results for input(s): AMMONIA in the last 168 hours. Coagulation Profile: No results for input(s): INR, PROTIME in the last 168  hours. Cardiac Enzymes: No results for input(s): CKTOTAL, CKMB, CKMBINDEX, TROPONINI in the last 168 hours. BNP (last 3 results) No results for input(s): PROBNP in the last 8760 hours. HbA1C: No results for input(s): HGBA1C in the last 72 hours. CBG: No results for input(s): GLUCAP in the last 168 hours. Lipid Profile: No results for input(s): CHOL, HDL, LDLCALC, TRIG, CHOLHDL, LDLDIRECT in the last 72 hours. Thyroid Function Tests: Recent Labs    05/31/20 0500  TSH 3.410   Anemia Panel: No results for input(s): VITAMINB12, FOLATE, FERRITIN, TIBC, IRON, RETICCTPCT in the last 72 hours. Sepsis Labs: No results for input(s): PROCALCITON, LATICACIDVEN in the last 168 hours.  Recent Results (from the past 240 hour(s))  SARS CORONAVIRUS 2 (TAT 6-24 HRS) Nasopharyngeal Nasopharyngeal Swab     Status: None   Collection Time: 05/26/20  2:17 PM   Specimen: Nasopharyngeal Swab  Result Value Ref Range Status   SARS Coronavirus 2 NEGATIVE NEGATIVE Final    Comment: (  NOTE) SARS-CoV-2 target nucleic acids are NOT DETECTED.  The SARS-CoV-2 RNA is generally detectable in upper and lower respiratory specimens during the acute phase of infection. Negative results do not preclude SARS-CoV-2 infection, do not rule out co-infections with other pathogens, and should not be used as the sole basis for treatment or other patient management decisions. Negative results must be combined with clinical observations, patient history, and epidemiological information. The expected result is Negative.  Fact Sheet for Patients: SugarRoll.be  Fact Sheet for Healthcare Providers: https://www.woods-mathews.com/  This test is not yet approved or cleared by the [place] FDA and  has been authorized for detection and/or diagnosis of SARS-CoV-2 by FDA under an Emergency Use Authorization (EUA). This EUA will remain  in effect (meaning this test can be used) for the  duration of the COVID-19 declaration under Se ction 564(b)(1) of the Act, 21 U.S.C. section 360bbb-3(b)(1), unless the authorization is terminated or revoked sooner.  Performed at [facility], [place] [address] [place], [place] [address]   SARS CORONAVIRUS 2 (TAT 6-24 HRS) Nasopharyngeal Nasopharyngeal Swab     Status: None   Collection Time: 05/30/20 10:10 PM   Specimen: Nasopharyngeal Swab  Result Value Ref Range Status   SARS Coronavirus 2 NEGATIVE NEGATIVE Final    Comment: (NOTE) SARS-CoV-2 target nucleic acids are NOT DETECTED.  The SARS-CoV-2 RNA is generally detectable in upper and lower respiratory specimens during the acute phase of infection. Negative results do not preclude SARS-CoV-2 infection, do not rule out co-infections with other pathogens, and should not be used as the sole basis for treatment or other patient management decisions. Negative results must be combined with clinical observations, patient history, and epidemiological information. The expected result is Negative.  Fact Sheet for Patients: SugarRoll.be  Fact Sheet for Healthcare Providers: https://www.woods-mathews.com/  This test is not yet approved or cleared by the [place] FDA and  has been authorized for detection and/or diagnosis of SARS-CoV-2 by FDA under an Emergency Use Authorization (EUA). This EUA will remain  in effect (meaning this test can be used) for the duration of the COVID-19 declaration under Se ction 564(b)(1) of the Act, 21 U.S.C. section 360bbb-3(b)(1), unless the authorization is terminated or revoked sooner.  Performed at [facility], [place] [address], [place], [place] [address]       Radiology Studies: ECHOCARDIOGRAM COMPLETE  Result Date: 06/01/2020    ECHOCARDIOGRAM REPORT   Patient Name:   MILTON STREICHER Date of Exam: 06/01/2020 Medical Rec #:  [id number]       Height:       66.0 in Accession #:    [id number]       Weight:       267.4 lb Date of Birth:  02-Jan-1946       BSA:          2.263 m Patient Age:    23 years        BP:           142/83 mmHg Patient Gender: F               HR:           74 bpm. Exam Location:  Inpatient Procedure: 2D Echo Indications:    CHF-Acute Diastolic X10.62  History:        Patient has prior history of Echocardiogram examinations, most                 recent 07/08/2019.  Arrythmias:Atrial Fibrillation; Risk                 Factors:Hypertension, Diabetes and Dyslipidemia.  Sonographer:    Mikki Santee RDCS (AE) Referring Phys: [id number] [place]  1. Left ventricular ejection fraction, by estimation, is 55 to 60%. The left ventricle has normal function. The left ventricle has no regional wall motion abnormalities. Left ventricular diastolic parameters are consistent with Grade II diastolic dysfunction (pseudonormalization).  2. Right ventricular systolic function is normal. The right ventricular size is normal. There is normal pulmonary artery systolic pressure.  3. Left atrial size was moderately dilated.  4. The mitral valve is normal in structure. Mild mitral valve regurgitation. No evidence of mitral stenosis.  5. The aortic valve is tricuspid. Aortic valve regurgitation is moderate. No aortic stenosis is present.  6. The inferior vena cava is normal in size with greater than 50% respiratory variability, suggesting right atrial pressure of 3 mmHg. Comparison(s): No significant change from prior study. FINDINGS  Left Ventricle: Left ventricular ejection fraction, by estimation, is 55 to 60%. The left ventricle has normal function. The left ventricle has no regional wall motion abnormalities. The left ventricular internal cavity size was normal in size. There is  no left ventricular hypertrophy. Left ventricular diastolic parameters are consistent with Grade II diastolic dysfunction (pseudonormalization). Right Ventricle: The right ventricular size is normal. Right  vetricular wall thickness was not well visualized. Right ventricular systolic function is normal. There is normal pulmonary artery systolic pressure. The tricuspid regurgitant velocity is 1.89 m/s, and with an assumed right atrial pressure of 3 mmHg, the estimated right ventricular systolic pressure is 27.7 mmHg. Left Atrium: Left atrial size was moderately dilated. Right Atrium: Right atrial size was normal in size. Pericardium: There is no evidence of pericardial effusion. Presence of pericardial fat pad. Mitral Valve: The mitral valve is normal in structure. Mild mitral valve regurgitation. No evidence of mitral valve stenosis. Tricuspid Valve: The tricuspid valve is normal in structure. Tricuspid valve regurgitation is mild . No evidence of tricuspid stenosis. Aortic Valve: The aortic valve is tricuspid. Aortic valve regurgitation is moderate. Aortic regurgitation PHT measures 682 msec. No aortic stenosis is present. Pulmonic Valve: The pulmonic valve was not well visualized. Pulmonic valve regurgitation is trivial. No evidence of pulmonic stenosis. Aorta: The aortic root, ascending aorta and aortic arch are all structurally normal, with no evidence of dilitation or obstruction. Venous: The inferior vena cava is normal in size with greater than 50% respiratory variability, suggesting right atrial pressure of 3 mmHg. IAS/Shunts: The atrial septum is grossly normal.  LEFT VENTRICLE PLAX 2D LVIDd:         5.40 cm  Diastology LVIDs:         3.70 cm  LV e' medial:    5.55 cm/s LV PW:         0.90 cm  LV E/e' medial:  17.9 LV IVS:        0.90 cm  LV e' lateral:   7.18 cm/s LVOT diam:     1.90 cm  LV E/e' lateral: 13.8 LV SV:         79 LV SV Index:   35 LVOT Area:     2.84 cm  RIGHT VENTRICLE TAPSE (M-mode): 1.7 cm LEFT ATRIUM             Index       RIGHT ATRIUM           Index  LA diam:        3.80 cm 1.68 cm/m  RA Area:     17.30 cm LA Vol (A2C):   77.3 ml 34.16 ml/m RA Volume:   37.10 ml  16.40 ml/m LA Vol  (A4C):   56.1 ml 24.79 ml/m LA Biplane Vol: 67.6 ml 29.88 ml/m  AORTIC VALVE LVOT Vmax:   131.00 cm/s LVOT Vmean:  81.600 cm/s LVOT VTI:    0.277 m AI PHT:      682 msec  AORTA Ao Root diam: 2.90 cm MITRAL VALVE               TRICUSPID VALVE MV Area (PHT): 2.83 cm    TR Peak grad:   14.3 mmHg MV Decel Time: 268 msec    TR Vmax:        189.00 cm/s MV E velocity: 99.40 cm/s MV A velocity: 66.00 cm/s  SHUNTS MV E/A ratio:  1.51        Systemic VTI:  0.28 m                            Systemic Diam: 1.90 cm Buford Dresser MD Electronically signed by Buford Dresser MD Signature Date/Time: 06/01/2020/8:33:41 PM    Final     Scheduled Meds: . apixaban  5 mg Oral BID  . atorvastatin  10 mg Oral Daily  . EPINEPHrine  0.3 mg Intramuscular Once  . flecainide  50 mg Oral BID  . irbesartan  150 mg Oral Daily  . levothyroxine  50 mcg Oral QAC breakfast  . magnesium oxide  400 mg Oral Daily  . metoprolol succinate  100 mg Oral Daily   And  . metoprolol succinate  50 mg Oral QHS  . pantoprazole  40 mg Oral Daily  . verapamil  180 mg Oral BID   Continuous Infusions:   LOS: 2 days   Time spent: 35 minutes   Missael Ferrari Loann Quill, MD [facility]  If 7PM-7AM, please contact night-coverage [email or web address] 06/02/2020, 12:07 PM

## 2020-06-02 NOTE — Progress Notes (Signed)
Progress Note  Patient Name: Leslie [name] Date of Encounter: 06/02/2020  [facility] Cardiologist: Sanda Klein, MD   Subjective   Echo yesterday shows LVEF 55-60% with grade 2 DD, moderate LAE - there is moderate AI without AS. Findings felt to be stable compared to her prior echo. Net negative another 380 cc overnight - now 3.2L negative. Creatinine up today to 1.64 (was 1.1).   Inpatient Medications    Scheduled Meds: . apixaban  5 mg Oral BID  . atorvastatin  10 mg Oral Daily  . EPINEPHrine  0.3 mg Intramuscular Once  . flecainide  50 mg Oral BID  . furosemide  40 mg Intravenous Q12H  . irbesartan  150 mg Oral Daily  . levothyroxine  50 mcg Oral QAC breakfast  . magnesium oxide  400 mg Oral Daily  . methylPREDNISolone (SOLU-MEDROL) injection  125 mg Intravenous Once  . metoprolol succinate  100 mg Oral Daily   And  . metoprolol succinate  50 mg Oral QHS  . pantoprazole  40 mg Oral Daily  . verapamil  180 mg Oral BID   Continuous Infusions:  PRN Meds: acetaminophen **OR** acetaminophen   Vital Signs    Vitals:   06/01/20 1126 06/01/20 1718 06/01/20 2318 06/02/20 0526  BP: (!) 142/83 126/62 130/62 125/66  Pulse: 82 70 74 68  Resp: 19 16 17 19   Temp: 98.5 F (36.9 C) 98.8 F (37.1 C) 98.4 F (36.9 C) 98 F (36.7 C)  TempSrc: Oral Oral Oral Oral  SpO2: 94% 92% 91% 92%  Weight:    119.7 kg  Height:        Intake/Output Summary (Last 24 hours) at 06/02/2020 1138 Last data filed at 06/02/2020 0926 Gross per 24 hour  Intake 120 ml  Output 700 ml  Net -580 ml   Last 3 Weights 06/02/2020 06/01/2020 05/30/2020  Weight (lbs) 263 lb 12.8 oz 267 lb 6.7 oz 270 lb  Weight (kg) 119.659 kg 121.3 kg 122.471 kg      Telemetry    Sinus rhythm - Personally Reviewed  ECG    No new tracings - Personally Reviewed  Physical Exam   GEN: No acute distress.   Neck: No JVD Cardiac: RRR, no murmurs, rubs, or gallops.  Respiratory: Clear to auscultation  bilaterally. GI: Soft, nontender, non-distended  MS: trace RLE edema (chronic); No deformity. Neuro:  Nonfocal  Psych: Normal affect   Labs    High Sensitivity Troponin:  No results for input(s): TROPONINIHS in the last 720 hours.    Chemistry Recent Labs  Lab 05/31/20 0500 06/01/20 0313 06/02/20 0009  NA 141 139 137  K 3.5 4.5 4.2  CL 105 103 100  CO2 23 26 26   GLUCOSE 111* 95 91  BUN 13 17 28*  CREATININE 1.14* 1.37* 1.64*  CALCIUM 8.5* 8.5* 8.5*  GFRNONAA 51* 41* 33*  ANIONGAP 13 10 11      Hematology Recent Labs  Lab 05/30/20 2050 05/31/20 0500 06/02/20 0009  WBC 17.5* 12.6* 12.2*  RBC 4.95 4.46 4.82  HGB 10.7* 9.9* 10.4*  HCT 37.7 32.8* 35.7*  MCV 76.2* 73.5* 74.1*  MCH 21.6* 22.2* 21.6*  MCHC 28.4* 30.2 29.1*  RDW 17.1* 17.0* 17.1*  PLT 382 360 405*    BNP Recent Labs  Lab 05/30/20 2050  BNP 306.6*     DDimer No results for input(s): DDIMER in the last 168 hours.   Radiology    ECHOCARDIOGRAM COMPLETE  Result Date: 06/01/2020  ECHOCARDIOGRAM REPORT   Patient Name:   Leslie [name] Date of Exam: 06/01/2020 Medical Rec #:  [id number]       Height:       66.0 in Accession #:    [id number]      Weight:       267.4 lb Date of Birth:  1946-03-24       BSA:          2.263 m Patient Age:    75 years        BP:           142/83 mmHg Patient Gender: F               HR:           74 bpm. Exam Location:  Inpatient Procedure: 2D Echo Indications:    CHF-Acute Diastolic W54.62  History:        Patient has prior history of Echocardiogram examinations, most                 recent 07/08/2019. Arrythmias:Atrial Fibrillation; Risk                 Factors:Hypertension, Diabetes and Dyslipidemia.  Sonographer:    Mikki Santee RDCS (AE) Referring Phys: [id number] [place]  1. Left ventricular ejection fraction, by estimation, is 55 to 60%. The left ventricle has normal function. The left ventricle has no regional wall motion abnormalities. Left  ventricular diastolic parameters are consistent with Grade II diastolic dysfunction (pseudonormalization).  2. Right ventricular systolic function is normal. The right ventricular size is normal. There is normal pulmonary artery systolic pressure.  3. Left atrial size was moderately dilated.  4. The mitral valve is normal in structure. Mild mitral valve regurgitation. No evidence of mitral stenosis.  5. The aortic valve is tricuspid. Aortic valve regurgitation is moderate. No aortic stenosis is present.  6. The inferior vena cava is normal in size with greater than 50% respiratory variability, suggesting right atrial pressure of 3 mmHg. Comparison(s): No significant change from prior study. FINDINGS  Left Ventricle: Left ventricular ejection fraction, by estimation, is 55 to 60%. The left ventricle has normal function. The left ventricle has no regional wall motion abnormalities. The left ventricular internal cavity size was normal in size. There is  no left ventricular hypertrophy. Left ventricular diastolic parameters are consistent with Grade II diastolic dysfunction (pseudonormalization). Right Ventricle: The right ventricular size is normal. Right vetricular wall thickness was not well visualized. Right ventricular systolic function is normal. There is normal pulmonary artery systolic pressure. The tricuspid regurgitant velocity is 1.89 m/s, and with an assumed right atrial pressure of 3 mmHg, the estimated right ventricular systolic pressure is 38.1 mmHg. Left Atrium: Left atrial size was moderately dilated. Right Atrium: Right atrial size was normal in size. Pericardium: There is no evidence of pericardial effusion. Presence of pericardial fat pad. Mitral Valve: The mitral valve is normal in structure. Mild mitral valve regurgitation. No evidence of mitral valve stenosis. Tricuspid Valve: The tricuspid valve is normal in structure. Tricuspid valve regurgitation is mild . No evidence of tricuspid stenosis.  Aortic Valve: The aortic valve is tricuspid. Aortic valve regurgitation is moderate. Aortic regurgitation PHT measures 682 msec. No aortic stenosis is present. Pulmonic Valve: The pulmonic valve was not well visualized. Pulmonic valve regurgitation is trivial. No evidence of pulmonic stenosis. Aorta: The aortic root, ascending aorta and aortic arch are all structurally normal, with no evidence of dilitation  or obstruction. Venous: The inferior vena cava is normal in size with greater than 50% respiratory variability, suggesting right atrial pressure of 3 mmHg. IAS/Shunts: The atrial septum is grossly normal.  LEFT VENTRICLE PLAX 2D LVIDd:         5.40 cm  Diastology LVIDs:         3.70 cm  LV e' medial:    5.55 cm/s LV PW:         0.90 cm  LV E/e' medial:  17.9 LV IVS:        0.90 cm  LV e' lateral:   7.18 cm/s LVOT diam:     1.90 cm  LV E/e' lateral: 13.8 LV SV:         79 LV SV Index:   35 LVOT Area:     2.84 cm  RIGHT VENTRICLE TAPSE (M-mode): 1.7 cm LEFT ATRIUM             Index       RIGHT ATRIUM           Index LA diam:        3.80 cm 1.68 cm/m  RA Area:     17.30 cm LA Vol (A2C):   77.3 ml 34.16 ml/m RA Volume:   37.10 ml  16.40 ml/m LA Vol (A4C):   56.1 ml 24.79 ml/m LA Biplane Vol: 67.6 ml 29.88 ml/m  AORTIC VALVE LVOT Vmax:   131.00 cm/s LVOT Vmean:  81.600 cm/s LVOT VTI:    0.277 m AI PHT:      682 msec  AORTA Ao Root diam: 2.90 cm MITRAL VALVE               TRICUSPID VALVE MV Area (PHT): 2.83 cm    TR Peak grad:   14.3 mmHg MV Decel Time: 268 msec    TR Vmax:        189.00 cm/s MV E velocity: 99.40 cm/s MV A velocity: 66.00 cm/s  SHUNTS MV E/A ratio:  1.51        Systemic VTI:  0.28 m                            Systemic Diam: 1.90 cm Buford Dresser MD Electronically signed by Buford Dresser MD Signature Date/Time: 06/01/2020/8:33:41 PM    Final     Cardiac Studies   NST 05/25/20:  There was no ST segment deviation noted during stress.  The study is normal.  This is a low  risk study.   Normal pharmacologic nuclear stress test with no evidence for prior infarct or ischemia. LVEF not calculated due to atrial fibrillation.   Echocardiogram 06/2019: 1. Left ventricular ejection fraction, by estimation, is 60 to 65%. The  left ventricle has normal function. The left ventricle has no regional  wall motion abnormalities. Left ventricular diastolic parameters are  consistent with Grade II diastolic  dysfunction (pseudonormalization). Elevated left ventricular end-diastolic  pressure. The average left ventricular global longitudinal strain is -20.5  %.  2. Right ventricular systolic function is normal. The right ventricular  size is normal. Tricuspid regurgitation signal is inadequate for assessing  PA pressure.  3. Left atrial size was moderately dilated.  4. The mitral valve is normal in structure. Mild mitral valve  regurgitation. No evidence of mitral stenosis.  5. The aortic valve is normal in structure. Aortic valve regurgitation is  moderate. No aortic stenosis is present. Aortic regurgitation PHT  measures  341 msec.  6. The inferior vena cava is normal in size with greater than 50%  respiratory variability, suggesting right atrial pressure of 3 mmHg.   Comparison(s): 07/21/16 EF 55-60%.   Patient Profile     75 y.o. female with a PMH of obesity, OSA on CPAP therapy, persistent atrial fibrillation, HTN, HLD, hypothyroidism and stage II CKD who is being followed by cardiology for the evaluation of acute CHF.  Assessment & Plan    1. Acute CHF: patient presented with progressive dyspnea following successful DCCV earlier 03/29/21. Her last echocardiogram was 06/2019 with EF 60-65% and indeterminate LV diastolic function in the setting of Afib. Possibly she has developed LV systolic dysfunction since that time. BNP elevated to 300s on admission. CXR with mild-moderate interstitial edema. She was started on IV lasix 40mg  BID with UOP net -3.2 L. Cr bumped  from 1.14 to 1.6 today - Echo shows normal LVEF, grade 2 DD, moderate AI - Stop IV lasix, probably at end-diuresis - consider resuming po lasix at higher 40 mg daily dose tomorrow if renal function improves - Continue metoprolol succinate and irbesartan - Continue to monitor strict I&Os and daily weights - Continue to monitor electrolytes and replete as needed to maintain K >4, Mg >2  2. Persistent atrial fibrillation: initially diagnosed following 2nd COVID vaccine in 2021. She underwent failed DCCV and was subsequently started on flecainide following a negative NST. She underwent repeat DCCV 05/30/20 which was successful. She continues to maintain sinus rhythm this admission - Continue metoprolol and verapamil for rate control - Continue flecainide for rhythm control - Continue Eliquis for stroke ppx  3. HTN: BP is generally at goal - Continue metoprolol, irbesartan, and verapamil  4. HLD: no recent lipids on file - Continue atorvastatin    Probably ok to d/c tomorrow if renal function improves - I will see on rounds again.  For questions or updates, please contact [place] Please consult [email or web address] for contact info under   Pixie Casino, MD, FACC, [place] Director of the [facility]  [facility] Diplomate of the American Board of Clinical Lipidology Attending Cardiologist  Direct Dial: [phone]  Fax: [phone]  Website:  [email or web address]  Pixie Casino, MD  06/02/2020, 11:38 AM

## 2020-06-02 NOTE — TOC Initial Note (Signed)
Transition of Care [facility]) - Initial/Assessment Note    Patient Details  Name: Leslie [name] MRN: [id number] Date of Birth: 1945-08-19  Transition of Care [facility]) CM/SW Contact:    Marilu Favre, RN Phone Number: 06/02/2020, 10:35 AM  Clinical Narrative:                 Patient from home alone but has multiple friends and family members to assist her.   Discussed recommendations for a walker and 3 in1 . At this time patient does not feel that she needs either . She does have friends who have DME that she can borrow if she changes her mind.  Cardiac outpatient rehab requires EF less then 35%    Expected Discharge Plan: Home/Self Care Barriers to Discharge: Continued Medical Work up   Patient Goals and CMS Choice Patient states their goals for this hospitalization and ongoing recovery are:: to return to home CMS Medicare.gov Compare Post Acute Care list provided to:: Patient Choice offered to / list presented to : Patient  Expected Discharge Plan and Services Expected Discharge Plan: Home/Self Care     Post Acute Care Choice: NA Living arrangements for the past 2 months: Single Family Home                 DME Arranged: N/A DME Agency: NA       HH Arranged: NA          Prior Living Arrangements/Services Living arrangements for the past 2 months: Single Family Home Lives with:: Self Patient language and need for interpreter reviewed:: Yes Do you feel safe going back to the place where you live?: Yes      Need for Family Participation in Patient Care: Yes (Comment) Care giver support system in place?: Yes (comment)   Criminal Activity/Legal Involvement Pertinent to Current Situation/Hospitalization: No - Comment as needed  Activities of Daily Living Home Assistive Devices/Equipment: Eyeglasses ADL Screening (condition at time of admission) Patient's cognitive ability adequate to safely complete daily activities?: Yes Is the patient deaf or have difficulty  hearing?: No Does the patient have difficulty seeing, even when wearing glasses/contacts?: No Does the patient have difficulty concentrating, remembering, or making decisions?: No Patient able to express need for assistance with ADLs?: Yes Does the patient have difficulty dressing or bathing?: No Independently performs ADLs?: Yes (appropriate for developmental age) Does the patient have difficulty walking or climbing stairs?: Yes Weakness of Legs: Both Weakness of Arms/Hands: None  Permission Sought/Granted   Permission granted to share information with : No              Emotional Assessment Appearance:: Appears stated age Attitude/Demeanor/Rapport: Engaged Affect (typically observed): Accepting Orientation: : Oriented to Self,Oriented to Place,Oriented to  Time,Oriented to Situation Alcohol / Substance Use: Not Applicable Psych Involvement: No (comment)  Admission diagnosis:  Acute pulmonary edema (HCC) [J81.0] Acute CHF (congestive heart failure) (HCC) [I50.9] Acute respiratory failure with hypoxia (HCC) [J96.01] Acute on chronic congestive heart failure, unspecified heart failure type ([place]) [I50.9] Acute CHF ([place]) [I50.9] Patient Active Problem List   Diagnosis Date Noted  . Acute CHF ([place]) 05/31/2020  . Acute CHF (congestive heart failure) ([place]) 05/30/2020  . OSA (obstructive sleep apnea) 11/16/2016  . Chronic anticoagulation 09/17/2016  . Mitral valve insufficiency   . Aortic valve regurgitation   . Persistent atrial fibrillation ([place]) 07/30/2016  . Essential hypertension, benign 06/07/2013  . Unspecified vitamin D deficiency 06/07/2013  . Pure hypercholesterolemia 06/07/2013  . Morbid  obesity ([place]) 06/07/2013  . Hypothyroidism 06/07/2013  . Chronic kidney disease, stage III (moderate) ([place]) 06/07/2013  . Contact dermatitis and other eczema, due to unspecified cause 06/07/2013   PCP:  Glenis Smoker, MD Pharmacy:   [facility] [place], [address] AT [facility] [address] [place] [address] Phone: [phone] Fax: [phone]     Social Determinants of Health (SDOH) Interventions    Readmission Risk Interventions No flowsheet data found.

## 2020-06-02 NOTE — Progress Notes (Signed)
Physical Therapy Treatment Patient Details Name: Leslie [name] MRN: [id number] DOB: Sep 05, 1945 Today's Date: 06/02/2020    History of Present Illness Pt is a 75 y.o. female who presented to ED with worsening SOB. She was diagnosed with A-fib post cardioversion on 2/15. PMH significant for CHF, HTN, hypothyroidism, HLD, CKD stage III, and recent root canal (on antibiotics).    PT Comments    Pt demonstrates mod I bed mobility and transfers. Supervision ambulation 150' without AD. Min guard assist ascend/descend 2 steps without rails. Updated equipments recs. No DME needed for home.    Follow Up Recommendations  Outpatient PT (cardiac rehab, if qualifies)     Equipment Recommendations  None recommended by PT    Recommendations for Other Services       Precautions / Restrictions Precautions Precautions: Fall    Mobility  Bed Mobility Overal bed mobility: Modified Independent                  Transfers Overall transfer level: Modified independent Equipment used: None                Ambulation/Gait Ambulation/Gait assistance: Supervision Gait Distance (Feet): 150 Feet Assistive device: None Gait Pattern/deviations: Step-through pattern;Decreased stride length Gait velocity: Decreased Gait velocity interpretation: 1.31 - 2.62 ft/sec, indicative of limited community ambulator General Gait Details: steady gait without AD. 1/4 DOE. HR 89   Stairs Stairs: Yes Stairs assistance: Min guard Stair Management: No rails;Step to pattern Number of Stairs: 2     Wheelchair Mobility    Modified Rankin (Stroke Patients Only)       Balance Overall balance assessment: Mild deficits observed, not formally tested                                          Cognition Arousal/Alertness: Awake/alert Behavior During Therapy: WFL for tasks assessed/performed Overall Cognitive Status: Within Functional Limits for tasks assessed                                         Exercises      General Comments        Pertinent Vitals/Pain Pain Assessment: Faces Faces Pain Scale: Hurts a little bit Pain Location: L knee (chronic) Pain Descriptors / Indicators: Discomfort Pain Intervention(s): Monitored during session    Home Living                      Prior Function            PT Goals (current goals can now be found in the care plan section) Acute Rehab PT Goals Patient Stated Goal: home Progress towards PT goals: Progressing toward goals    Frequency    Min 3X/week      PT Plan Equipment recommendations need to be updated    Co-evaluation              AM-PAC PT "6 Clicks" Mobility   Outcome Measure  Help needed turning from your back to your side while in a flat bed without using bedrails?: None Help needed moving from lying on your back to sitting on the side of a flat bed without using bedrails?: None Help needed moving to and from a bed to a chair (including a wheelchair)?: None Help  needed standing up from a chair using your arms (e.g., wheelchair or bedside chair)?: None Help needed to walk in hospital room?: None Help needed climbing 3-5 steps with a railing? : A Little 6 Click Score: 23    End of Session Equipment Utilized During Treatment: Gait belt Activity Tolerance: Patient tolerated treatment well Patient left: in bed;with call bell/phone within reach Nurse Communication: Mobility status PT Visit Diagnosis: Unsteadiness on feet (R26.81);Pain;Muscle weakness (generalized) (M62.81) Pain - Right/Left: Left Pain - part of body: Knee     Time: 6415-8309 PT Time Calculation (min) (ACUTE ONLY): 15 min  Charges:  $Gait Training: 8-22 mins                     Lorrin Goodell, PT  Office # [phone] Pager [phone]    Leslie [name] 06/02/2020, 12:54 PM

## 2020-06-03 ENCOUNTER — Other Ambulatory Visit: Payer: Self-pay | Admitting: Internal Medicine

## 2020-06-03 DIAGNOSIS — N1831 Chronic kidney disease, stage 3a: Secondary | ICD-10-CM

## 2020-06-03 LAB — BASIC METABOLIC PANEL
Anion gap: 13 (ref 5–15)
BUN: 34 mg/dL — ABNORMAL HIGH (ref 8–23)
CO2: 24 mmol/L (ref 22–32)
Calcium: 8.4 mg/dL — ABNORMAL LOW (ref 8.9–10.3)
Chloride: 101 mmol/L (ref 98–111)
Creatinine, Ser: 1.61 mg/dL — ABNORMAL HIGH (ref 0.44–1.00)
GFR, Estimated: 33 mL/min — ABNORMAL LOW (ref >60)
Glucose, Bld: 90 mg/dL (ref 70–99)
Potassium: 4.1 mmol/L (ref 3.5–5.1)
Sodium: 138 mmol/L (ref 135–145)

## 2020-06-03 LAB — MAGNESIUM: Magnesium: 2.2 mg/dL (ref 1.7–2.4)

## 2020-06-03 MED ORDER — SODIUM CHLORIDE 0.9 % IV SOLN: 200.0000 mg | Freq: Once | INTRAVENOUS | Status: DC

## 2020-06-03 MED ORDER — FERROUS SULFATE 325 (65 FE) MG PO TABS: 325.0000 mg | ORAL_TABLET | Freq: Two times a day (BID) | ORAL | 0 refills | Status: DC

## 2020-06-03 MED ORDER — FUROSEMIDE 40 MG PO TABS: 40.0000 mg | ORAL_TABLET | Freq: Every day | ORAL | 0 refills | Status: DC

## 2020-06-03 MED ORDER — FUROSEMIDE 40 MG PO TABS: 40.0000 mg | ORAL_TABLET | Freq: Every day | ORAL | Status: DC

## 2020-06-03 MED ORDER — SODIUM CHLORIDE 0.9 % IV SOLN
250.0000 mg | Freq: Once | INTRAVENOUS | Status: AC
  Administered 2020-06-03: 250 mg via INTRAVENOUS
  Filled 2020-06-03: qty 20

## 2020-06-03 MED ORDER — FERROUS SULFATE 325 (65 FE) MG PO TABS: 325.0000 mg | ORAL_TABLET | Freq: Two times a day (BID) | ORAL | Status: DC

## 2020-06-03 NOTE — Discharge Summary (Signed)
Physician Discharge Summary  SUKAINA TOOTHAKER JJO:[id number] DOB: 01-07-1946 DOA: 05/30/2020  PCP: Glenis Smoker, MD  Admit date: 05/30/2020 Discharge date: 06/03/2020  Admitted From: Home Disposition:  Home  Recommendations for Outpatient Follow-up:  1. Follow-up with PCP in 1 to 2 weeks 2. Repeat BMP next week 2/23 (orders placed) 3. Follow-up with cardiology as a scheduled 4. Start taking Lasix 40 mg p.o. daily tomorrow 5. Take ferrous sulfate twice daily  Home Health: None Equipment/Devices: None Discharge Condition: Stable CODE STATUS: Full code Diet recommendation: Low-sodium diet  Brief/Interim Summary: RENESSA WELLNITZ a 75 y.o.femalewithhistory of paroxysmal atrial fibrillation status post cardioversion this morning with history of hypertension hypothyroidism hyperlipidemia presents to the ER after patient became more short of breath towards the evening. Patient states even prior to the cardioversion patient has been mildly short of breath. Has been taking diuretics at home. Patient has been cardioverted for the second time in the last 3 weeks. Has been compliant with Eliquis. Patient states towards the evening patient became more acutely short of breath even at rest but no chest pain or productive cough.  ED Course:In the ER chest x-ray shows features concerning for fluid overload and BNP was around 300. WBC count was 17.5. Patient was given Lasix 40 mg IV and admitted for acute CHF. Covid test was negative.   Acute diastolic heart failure: -Likely related to persistent A. Fib underwent cardioversion on February 15. -Echo shows ejection fraction of 55 to 60% with grade 2 diastolic dysfunction -Patient started on Lasix 40 mg IV twice daily on admission-due to worsening renal function IV Lasix discontinued. cardiology-recommended to resume p.o. Lasix 40 mg daily tomorrow 2/20. Follow up with cardiology at a fib clinic as scheduled. -She is - 3.3 L and -7 lb since  admission -Continued metoprolol, irbesartan -PT recommended outpatient PT  Persistent A. Fib: -Underwent DCCV on 1/25 but failed and then repeat DCCV on 02/15 which was successful. -Continued flecainide, Eliquis, verapamil, metoprolol  Hyperlipidemia: Continued statin  Hypertension: Remained Stable -Continued irbesartan, metoprolol, verapamil  Leukocytosis: Chronic -Patient remained afebrile.  No signs of infection. -COVID-19 is negative.  She was treated last week with antibiotics for infection.  Hypothyroidism: TSH: WNL.  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   -Held IV Lasix-Okay to DC from cardiac stand point-recommend to resume p.o. Lasix tomorrow. Repeat BMP-middle of next week.  Morbid obesity with BMI of 43: -Diet modification/exercise and weight loss recommended.  Iron deficiency anemia: H&H at baseline. Reviewed iron studies-Ordered IV Iron x1 & started on ferrous sulfate PO BID.  Thrombocytosis: Platelet 405-likely reactive  Hypomagnesemia: Resolved   OSA: On CPAP  Discharge Diagnoses:  Acute diastolic CHF Persistent A. 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 daily tomorrow Take ferrous sulfate twice daily   Increase activity slowly   Complete by: As directed      Allergies as of 06/03/2020      Reactions   Clindamycin Hcl    Lip and face swelling  Augmentin [amoxicillin-pot Clavulanate] Diarrhea   Did it involve swelling of the  face/tongue/throat, SOB, or low BP? No Did it involve sudden or severe rash/hives, skin peeling, or any reaction on the inside of your mouth or nose? No Did you need to seek medical attention at a hospital or doctor's office? No When did it last happen?10 + years If all above answers are "NO", may proceed with cephalosporin use.   Codeine Nausea Only, Other (See Comments)   Hyperactivity   Demerol [meperidine] Nausea And Vomiting   Sulfa Antibiotics Rash      Medication List    TAKE these medications   acetaminophen 650 MG CR tablet Commonly known as: TYLENOL Take 1,300 mg by mouth every 8 (eight) hours as needed for pain.   ALIGN PREBIOTIC-PROBIOTIC PO Take 2 capsules by mouth daily. Gummy   atorvastatin 10 MG tablet Commonly known as: LIPITOR Take 10 mg by mouth daily.   Centrum MultiGummies Chew Chew 1 tablet by mouth daily.   cholecalciferol 1000 units tablet Commonly known as: VITAMIN D Take 1,000 Units by mouth daily.   diclofenac Sodium 1 % Gel Commonly known as: VOLTAREN Apply 1 application topically 2 (two) times daily.   Eliquis 5 MG Tabs tablet Generic drug: apixaban TAKE 1 TABLET(5 MG) BY MOUTH TWICE DAILY What changed: See the new instructions.   ferrous sulfate 325 (65 FE) MG tablet Take 1 tablet (325 mg total) by mouth 2 (two) times daily with a meal.   fexofenadine 180 MG tablet Commonly known as: ALLEGRA Take 180 mg by mouth daily.   flecainide 50 MG tablet Commonly known as: TAMBOCOR Take 1 tablet (50 mg total) by mouth 2 (two) times daily.   fluticasone 44 MCG/ACT inhaler Commonly known as: Flovent HFA Inhale 2 puffs into the lungs 2 (two) times daily. What changed: when to take this   fluticasone 50 MCG/ACT nasal spray Commonly known as: FLONASE Place 2 sprays into both nostrils at bedtime.   furosemide 40 MG tablet Commonly known as: LASIX Take 1 tablet (40 mg total) by mouth daily. Start taking on: June 04, 2020 What  changed:   medication strength  how much to take   irbesartan 150 MG tablet Commonly known as: AVAPRO TAKE 1 TABLET(150 MG) BY MOUTH DAILY What changed: See the new instructions.   levothyroxine 50 MCG tablet Commonly known as: SYNTHROID Take 50 mcg by mouth daily before breakfast.   MAGnesium-Oxide 400 (241.3 Mg) MG tablet Generic drug: magnesium oxide TAKE 1 TABLET BY MOUTH DAILY What changed: how much to take   Metamucil 28.3 % Powd Generic drug: Psyllium Take 1 Scoop by mouth daily.   metoprolol succinate 100 MG 24 hr tablet Commonly known as: TOPROL-XL Take 100 mg (one tablet) in the morning and 50 mg (half a tablet) in the evening. What changed:   how much to take  how to take this  when to take this   metroNIDAZOLE 0.75 % cream Commonly known as: METROCREAM Apply 1 application topically daily.   mometasone 0.1 % cream Commonly known as: ELOCON Apply 1 application topically 2 (two) times daily as needed (skin irritation).   omeprazole 20 MG capsule Commonly known as: PRILOSEC Take 40 mg by mouth daily before breakfast.   OSTEO BI-FLEX REGULAR STRENGTH PO Take 1 tablet by mouth daily.   senna 8.6 MG tablet Commonly known as: SENOKOT Take 1 tablet by mouth every evening.   verapamil 180 MG CR tablet Commonly known as: CALAN-SR  Take 180 mg by mouth 2 (two) times daily.            Durable Medical Equipment  (From admission, onward)         Start     Ordered   06/01/20 1454  For home use only DME 3 n 1  Once        06/01/20 1453   06/01/20 1454  For home use only DME Walker rolling  Once       Question Answer Comment  Walker: With [place] Wheels   Patient needs a walker to treat with the following condition Weakness      06/01/20 1453          Follow-up Information    Sherran Needs, NP Follow up on 06/14/2020.   Specialties: Nurse Practitioner, Cardiology Why: Please arrive 15 minutes early for your 11am follow-up  appointment Contact information: [place] [id number] [phone]              Allergies  Allergen Reactions  . Clindamycin Hcl     Lip and face swelling  . Augmentin [Amoxicillin-Pot Clavulanate] Diarrhea    Did it involve swelling of the face/tongue/throat, SOB, or low BP? No Did it involve sudden or severe rash/hives, skin peeling, or any reaction on the inside of your mouth or nose? No Did you need to seek medical attention at a hospital or doctor's office? No When did it last happen?10 + years If all above answers are "NO", may proceed with cephalosporin use.   . Codeine Nausea Only and Other (See Comments)    Hyperactivity  . Demerol [Meperidine] Nausea And Vomiting  . Sulfa Antibiotics Rash    Consultations:  cardiology   Procedures/Studies: DG Chest Port 1 View  Result Date: 05/30/2020 CLINICAL DATA:  Recent cardioversion for atrial fibrillation with wheezing and shortness of breath. EXAM: PORTABLE CHEST 1 VIEW COMPARISON:  July 30, 2016 FINDINGS: Mild to moderate severity diffusely increased interstitial lung markings are seen. This is increased in severity when compared to the prior study. Mild areas of atelectasis and/or infiltrate are also seen within the bilateral lung bases. There is no evidence of a pleural effusion or pneumothorax. The cardiac silhouette is moderately enlarged. The visualized skeletal structures are unremarkable. IMPRESSION: 1. Mild to moderate severity interstitial edema. 2. Mild bibasilar atelectasis and/or infiltrate. Electronically Signed   By: Virgina Norfolk M.D.   On: 05/30/2020 20:47   MYOCARDIAL PERFUSION IMAGING  Result Date: 05/25/2020  There was no ST segment deviation noted during stress.  The study is normal.  This is a low risk study.  Normal pharmacologic nuclear stress test with no evidence for prior infarct or ischemia. LVEF not calculated due to atrial fibrillation.  ECHOCARDIOGRAM  COMPLETE  Result Date: 06/01/2020    ECHOCARDIOGRAM REPORT   Patient Name:   ALEXIE LANNI Date of Exam: 06/01/2020 Medical Rec #:  [id number]       Height:       66.0 in Accession #:    [id number]      Weight:       267.4 lb Date of Birth:  13-Apr-1946       BSA:          2.263 m Patient Age:    75 years        BP:           142/83 mmHg Patient Gender: F  HR:           74 bpm. Exam Location:  Inpatient Procedure: 2D Echo Indications:    CHF-Acute Diastolic Q33.00  History:        Patient has prior history of Echocardiogram examinations, most                 recent 07/08/2019. Arrythmias:Atrial Fibrillation; Risk                 Factors:Hypertension, Diabetes and Dyslipidemia.  Sonographer:    Mikki Santee RDCS (AE) Referring Phys: [id number] [place]  1. Left ventricular ejection fraction, by estimation, is 55 to 60%. The left ventricle has normal function. The left ventricle has no regional wall motion abnormalities. Left ventricular diastolic parameters are consistent with Grade II diastolic dysfunction (pseudonormalization).  2. Right ventricular systolic function is normal. The right ventricular size is normal. There is normal pulmonary artery systolic pressure.  3. Left atrial size was moderately dilated.  4. The mitral valve is normal in structure. Mild mitral valve regurgitation. No evidence of mitral stenosis.  5. The aortic valve is tricuspid. Aortic valve regurgitation is moderate. No aortic stenosis is present.  6. The inferior vena cava is normal in size with greater than 50% respiratory variability, suggesting right atrial pressure of 3 mmHg. Comparison(s): No significant change from prior study. FINDINGS  Left Ventricle: Left ventricular ejection fraction, by estimation, is 55 to 60%. The left ventricle has normal function. The left ventricle has no regional wall motion abnormalities. The left ventricular internal cavity size was normal in size. There is  no left  ventricular hypertrophy. Left ventricular diastolic parameters are consistent with Grade II diastolic dysfunction (pseudonormalization). Right Ventricle: The right ventricular size is normal. Right vetricular wall thickness was not well visualized. Right ventricular systolic function is normal. There is normal pulmonary artery systolic pressure. The tricuspid regurgitant velocity is 1.89 m/s, and with an assumed right atrial pressure of 3 mmHg, the estimated right ventricular systolic pressure is 35.4 mmHg. Left Atrium: Left atrial size was moderately dilated. Right Atrium: Right atrial size was normal in size. Pericardium: There is no evidence of pericardial effusion. Presence of pericardial fat pad. Mitral Valve: The mitral valve is normal in structure. Mild mitral valve regurgitation. No evidence of mitral valve stenosis. Tricuspid Valve: The tricuspid valve is normal in structure. Tricuspid valve regurgitation is mild . No evidence of tricuspid stenosis. Aortic Valve: The aortic valve is tricuspid. Aortic valve regurgitation is moderate. Aortic regurgitation PHT measures 682 msec. No aortic stenosis is present. Pulmonic Valve: The pulmonic valve was not well visualized. Pulmonic valve regurgitation is trivial. No evidence of pulmonic stenosis. Aorta: The aortic root, ascending aorta and aortic arch are all structurally normal, with no evidence of dilitation or obstruction. Venous: The inferior vena cava is normal in size with greater than 50% respiratory variability, suggesting right atrial pressure of 3 mmHg. IAS/Shunts: The atrial septum is grossly normal.  LEFT VENTRICLE PLAX 2D LVIDd:         5.40 cm  Diastology LVIDs:         3.70 cm  LV e' medial:    5.55 cm/s LV PW:         0.90 cm  LV E/e' medial:  17.9 LV IVS:        0.90 cm  LV e' lateral:   7.18 cm/s LVOT diam:     1.90 cm  LV E/e' lateral: 13.8 LV SV:  79 LV SV Index:   35 LVOT Area:     2.84 cm  RIGHT VENTRICLE TAPSE (M-mode): 1.7 cm LEFT  ATRIUM             Index       RIGHT ATRIUM           Index LA diam:        3.80 cm 1.68 cm/m  RA Area:     17.30 cm LA Vol (A2C):   77.3 ml 34.16 ml/m RA Volume:   37.10 ml  16.40 ml/m LA Vol (A4C):   56.1 ml 24.79 ml/m LA Biplane Vol: 67.6 ml 29.88 ml/m  AORTIC VALVE LVOT Vmax:   131.00 cm/s LVOT Vmean:  81.600 cm/s LVOT VTI:    0.277 m AI PHT:      682 msec  AORTA Ao Root diam: 2.90 cm MITRAL VALVE               TRICUSPID VALVE MV Area (PHT): 2.83 cm    TR Peak grad:   14.3 mmHg MV Decel Time: 268 msec    TR Vmax:        189.00 cm/s MV E velocity: 99.40 cm/s MV A velocity: 66.00 cm/s  SHUNTS MV E/A ratio:  1.51        Systemic VTI:  0.28 m                            Systemic Diam: 1.90 cm Buford Dresser MD Electronically signed by Buford Dresser MD Signature Date/Time: 06/01/2020/8:33:41 PM    Final       Subjective: Patient seen and examined.  Tells me that she feels good this morning.  No new complaints.  Wishes to go home today.  Discharge Exam: Vitals:   06/03/20 0617 06/03/20 0810  BP: 115/62 (!) 141/72  Pulse: 71 73  Resp: 18   Temp: 97.9 F (36.6 C)   SpO2: 98%    Vitals:   06/02/20 1702 06/02/20 2330 06/03/20 0617 06/03/20 0810  BP: (!) 115/50 119/64 115/62 (!) 141/72  Pulse: 61 72 71 73  Resp: 18 19 18    Temp: 98.7 F (37.1 C) 97.7 F (36.5 C) 97.9 F (36.6 C)   TempSrc: Oral Oral Oral   SpO2: 98% 95% 98%   Weight:   119.8 kg   Height:        General: Pt is alert, awake, not in acute distress, on RA Cardiovascular: RRR, S1/S2 +, no rubs, no gallops Respiratory: CTA bilaterally, no wheezing, no rhonchi Abdominal: Soft, NT, ND, bowel sounds + Extremities: no edema, no cyanosis    The results of significant diagnostics from this hospitalization (including imaging, microbiology, ancillary and laboratory) are listed below for reference.     Microbiology: Recent Results (from the past 240 hour(s))  SARS CORONAVIRUS 2 (TAT 6-24 HRS)  Nasopharyngeal Nasopharyngeal Swab     Status: None   Collection Time: 05/26/20  2:17 PM   Specimen: Nasopharyngeal Swab  Result Value Ref Range Status   SARS Coronavirus 2 NEGATIVE NEGATIVE Final    Comment: (NOTE) SARS-CoV-2 target nucleic acids are NOT DETECTED.  The SARS-CoV-2 RNA is generally detectable in upper and lower respiratory specimens during the acute phase of infection. Negative results do not preclude SARS-CoV-2 infection, do not rule out co-infections with other pathogens, and should not be used as the sole basis for treatment or other patient management decisions. Negative results must  be combined with clinical observations, patient history, and epidemiological information. The expected result is Negative.  Fact Sheet for Patients: SugarRoll.be  Fact Sheet for Healthcare Providers: https://www.woods-mathews.com/  This test is not yet approved or cleared by the [place] FDA and  has been authorized for detection and/or diagnosis of SARS-CoV-2 by FDA under an Emergency Use Authorization (EUA). This EUA will remain  in effect (meaning this test can be used) for the duration of the COVID-19 declaration under Se ction 564(b)(1) of the Act, 21 U.S.C. section 360bbb-3(b)(1), unless the authorization is terminated or revoked sooner.  Performed at [facility], [place] [address] [place], [place] [address]   SARS CORONAVIRUS 2 (TAT 6-24 HRS) Nasopharyngeal Nasopharyngeal Swab     Status: None   Collection Time: 05/30/20 10:10 PM   Specimen: Nasopharyngeal Swab  Result Value Ref Range Status   SARS Coronavirus 2 NEGATIVE NEGATIVE Final    Comment: (NOTE) SARS-CoV-2 target nucleic acids are NOT DETECTED.  The SARS-CoV-2 RNA is generally detectable in upper and lower respiratory specimens during the acute phase of infection. Negative results do not preclude SARS-CoV-2 infection, do not rule out co-infections with other  pathogens, and should not be used as the sole basis for treatment or other patient management decisions. Negative results must be combined with clinical observations, patient history, and epidemiological information. The expected result is Negative.  Fact Sheet for Patients: SugarRoll.be  Fact Sheet for Healthcare Providers: https://www.woods-mathews.com/  This test is not yet approved or cleared by the [place] FDA and  has been authorized for detection and/or diagnosis of SARS-CoV-2 by FDA under an Emergency Use Authorization (EUA). This EUA will remain  in effect (meaning this test can be used) for the duration of the COVID-19 declaration under Se ction 564(b)(1) of the Act, 21 U.S.C. section 360bbb-3(b)(1), unless the authorization is terminated or revoked sooner.  Performed at [facility], [place] [address] [place], [place] [address]      Labs: BNP (last 3 results) Recent Labs    05/30/20 2050  BNP 093.2*   Basic Metabolic Panel: Recent Labs  Lab 05/30/20 2050 05/31/20 0500 06/01/20 0313 06/02/20 0009 06/03/20 0241  NA 139 141 139 137 138  K 4.1 3.5 4.5 4.2 4.1  CL 104 105 103 100 101  CO2 21* 23 26 26 24   GLUCOSE 117* 111* 95 91 90  BUN 18 13 17  28* 34*  CREATININE 1.11* 1.14* 1.37* 1.64* 1.61*  CALCIUM 8.3* 8.5* 8.5* 8.5* 8.4*  MG  --   --  1.7 1.7 2.2   Liver Function Tests: No results for input(s): AST, ALT, ALKPHOS, BILITOT, PROT, ALBUMIN in the last 168 hours. No results for input(s): LIPASE, AMYLASE in the last 168 hours. No results for input(s): AMMONIA in the last 168 hours. CBC: Recent Labs  Lab 05/30/20 2050 05/31/20 0500 06/02/20 0009  WBC 17.5* 12.6* 12.2*  NEUTROABS 14.9*  --   --   HGB 10.7* 9.9* 10.4*  HCT 37.7 32.8* 35.7*  MCV 76.2* 73.5* 74.1*  PLT 382 360 405*   Cardiac Enzymes: No results for input(s): CKTOTAL, CKMB, CKMBINDEX, TROPONINI in the last 168 hours. BNP: Invalid  input(s): POCBNP CBG: No results for input(s): GLUCAP in the last 168 hours. D-Dimer No results for input(s): DDIMER in the last 72 hours. Hgb A1c No results for input(s): HGBA1C in the last 72 hours. Lipid Profile No results for input(s): CHOL, HDL, LDLCALC, TRIG, CHOLHDL, LDLDIRECT in the last  72 hours. Thyroid function studies No results for input(s): TSH, T4TOTAL, T3FREE, THYROIDAB in the last 72 hours.  Invalid input(s): FREET3 Anemia work up Recent Labs    06/02/20 1313  FERRITIN 37  TIBC 437  IRON 24*   Urinalysis No results found for: COLORURINE, APPEARANCEUR, LABSPEC, [place], GLUCOSEU, [place], [place], [place], PROTEINUR, UROBILINOGEN, NITRITE, LEUKOCYTESUR Sepsis Labs Invalid input(s): PROCALCITONIN,  WBC,  LACTICIDVEN Microbiology Recent Results (from the past 240 hour(s))  SARS CORONAVIRUS 2 (TAT 6-24 HRS) Nasopharyngeal Nasopharyngeal Swab     Status: None   Collection Time: 05/26/20  2:17 PM   Specimen: Nasopharyngeal Swab  Result Value Ref Range Status   SARS Coronavirus 2 NEGATIVE NEGATIVE Final    Comment: (NOTE) SARS-CoV-2 target nucleic acids are NOT DETECTED.  The SARS-CoV-2 RNA is generally detectable in upper and lower respiratory specimens during the acute phase of infection. Negative results do not preclude SARS-CoV-2 infection, do not rule out co-infections with other pathogens, and should not be used as the sole basis for treatment or other patient management decisions. Negative results must be combined with clinical observations, patient history, and epidemiological information. The expected result is Negative.  Fact Sheet for Patients: SugarRoll.be  Fact Sheet for Healthcare Providers: https://www.woods-mathews.com/  This test is not yet approved or cleared by the [place] FDA and  has been authorized for detection and/or diagnosis of SARS-CoV-2 by FDA under an Emergency Use  Authorization (EUA). This EUA will remain  in effect (meaning this test can be used) for the duration of the COVID-19 declaration under Se ction 564(b)(1) of the Act, 21 U.S.C. section 360bbb-3(b)(1), unless the authorization is terminated or revoked sooner.  Performed at [facility], [place] [address] [place], [place] [address]   SARS CORONAVIRUS 2 (TAT 6-24 HRS) Nasopharyngeal Nasopharyngeal Swab     Status: None   Collection Time: 05/30/20 10:10 PM   Specimen: Nasopharyngeal Swab  Result Value Ref Range Status   SARS Coronavirus 2 NEGATIVE NEGATIVE Final    Comment: (NOTE) SARS-CoV-2 target nucleic acids are NOT DETECTED.  The SARS-CoV-2 RNA is generally detectable in upper and lower respiratory specimens during the acute phase of infection. Negative results do not preclude SARS-CoV-2 infection, do not rule out co-infections with other pathogens, and should not be used as the sole basis for treatment or other patient management decisions. Negative results must be combined with clinical observations, patient history, and epidemiological information. The expected result is Negative.  Fact Sheet for Patients: SugarRoll.be  Fact Sheet for Healthcare Providers: https://www.woods-mathews.com/  This test is not yet approved or cleared by the [place] FDA and  has been authorized for detection and/or diagnosis of SARS-CoV-2 by FDA under an Emergency Use Authorization (EUA). This EUA will remain  in effect (meaning this test can be used) for the duration of the COVID-19 declaration under Se ction 564(b)(1) of the Act, 21 U.S.C. section 360bbb-3(b)(1), unless the authorization is terminated or revoked sooner.  Performed at [facility], [place] [address] [place], [place] [address]      Time coordinating discharge: Over 30 minutes  SIGNED:   Mckinley Jewel, MD  [facility] 06/03/2020, 11:44 AM Pager   If  7PM-7AM, please contact night-coverage [email or web address]

## 2020-06-03 NOTE — Progress Notes (Signed)
Nsg Discharge Note  Admit Date:  05/30/2020 Discharge date: 06/03/2020   CIPRIANA BILLER to be D/C'd Home per MD order.  AVS completed.  Copy for chart, and copy for patient signed, and dated. Patient/caregiver able to verbalize understanding.  Discharge Medication: Allergies as of 06/03/2020      Reactions   Clindamycin Hcl    Lip and face swelling   Augmentin [amoxicillin-pot Clavulanate] Diarrhea   Did it involve swelling of the face/tongue/throat, SOB, or low BP? No Did it involve sudden or severe rash/hives, skin peeling, or any reaction on the inside of your mouth or nose? No Did you need to seek medical attention at a hospital or doctor's office? No When did it last happen?10 + years If all above answers are "NO", may proceed with cephalosporin use.   Codeine Nausea Only, Other (See Comments)   Hyperactivity   Demerol [meperidine] Nausea And Vomiting   Sulfa Antibiotics Rash      Medication List    TAKE these medications   acetaminophen 650 MG CR tablet Commonly known as: TYLENOL Take 1,300 mg by mouth every 8 (eight) hours as needed for pain.   ALIGN PREBIOTIC-PROBIOTIC PO Take 2 capsules by mouth daily. Gummy   atorvastatin 10 MG tablet Commonly known as: LIPITOR Take 10 mg by mouth daily.   Centrum MultiGummies Chew Chew 1 tablet by mouth daily.   cholecalciferol 1000 units tablet Commonly known as: VITAMIN D Take 1,000 Units by mouth daily.   diclofenac Sodium 1 % Gel Commonly known as: VOLTAREN Apply 1 application topically 2 (two) times daily.   Eliquis 5 MG Tabs tablet Generic drug: apixaban TAKE 1 TABLET(5 MG) BY MOUTH TWICE DAILY What changed: See the new instructions.   ferrous sulfate 325 (65 FE) MG tablet Take 1 tablet (325 mg total) by mouth 2 (two) times daily with a meal.   fexofenadine 180 MG tablet Commonly known as: ALLEGRA Take 180 mg by mouth daily.   flecainide 50 MG tablet Commonly known as: TAMBOCOR Take 1 tablet (50 mg  total) by mouth 2 (two) times daily.   fluticasone 44 MCG/ACT inhaler Commonly known as: Flovent HFA Inhale 2 puffs into the lungs 2 (two) times daily. What changed: when to take this   fluticasone 50 MCG/ACT nasal spray Commonly known as: FLONASE Place 2 sprays into both nostrils at bedtime.   furosemide 40 MG tablet Commonly known as: LASIX Take 1 tablet (40 mg total) by mouth daily. Start taking on: June 04, 2020 What changed:   medication strength  how much to take   irbesartan 150 MG tablet Commonly known as: AVAPRO TAKE 1 TABLET(150 MG) BY MOUTH DAILY What changed: See the new instructions.   levothyroxine 50 MCG tablet Commonly known as: SYNTHROID Take 50 mcg by mouth daily before breakfast.   MAGnesium-Oxide 400 (241.3 Mg) MG tablet Generic drug: magnesium oxide TAKE 1 TABLET BY MOUTH DAILY What changed: how much to take   Metamucil 28.3 % Powd Generic drug: Psyllium Take 1 Scoop by mouth daily.   metoprolol succinate 100 MG 24 hr tablet Commonly known as: TOPROL-XL Take 100 mg (one tablet) in the morning and 50 mg (half a tablet) in the evening. What changed:   how much to take  how to take this  when to take this   metroNIDAZOLE 0.75 % cream Commonly known as: METROCREAM Apply 1 application topically daily.   mometasone 0.1 % cream Commonly known as: ELOCON Apply 1 application topically  2 (two) times daily as needed (skin irritation).   omeprazole 20 MG capsule Commonly known as: PRILOSEC Take 40 mg by mouth daily before breakfast.   OSTEO BI-FLEX REGULAR STRENGTH PO Take 1 tablet by mouth daily.   senna 8.6 MG tablet Commonly known as: SENOKOT Take 1 tablet by mouth every evening.   verapamil 180 MG CR tablet Commonly known as: CALAN-SR Take 180 mg by mouth 2 (two) times daily.            Durable Medical Equipment  (From admission, onward)         Start     Ordered   06/01/20 1454  For home use only DME 3 n 1  Once         06/01/20 1453   06/01/20 1454  For home use only DME Walker rolling  Once       Question Answer Comment  Walker: With 5 Inch Wheels   Patient needs a walker to treat with the following condition Weakness      06/01/20 1453          Discharge Assessment: Vitals:   06/03/20 0810 06/03/20 1155  BP: (!) 141/72 (!) 103/54  Pulse: 73 67  Resp:  16  Temp:  97.7 F (36.5 C)  SpO2:  93%   Skin clean, dry and intact without evidence of skin break down, no evidence of skin tears noted. IV catheter discontinued intact. Site without signs and symptoms of complications - no redness or edema noted at insertion site, patient denies c/o pain - only slight tenderness at site.  Dressing with slight pressure applied.  D/c Instructions-Education: Discharge instructions given to patient/family with verbalized understanding. D/c education completed with patient/family including follow up instructions, medication list, d/c activities limitations if indicated, with other d/c instructions as indicated by MD - patient able to verbalize understanding, all questions fully answered. Patient instructed to return to ED, call 911, or call MD for any changes in condition.  Patient escorted via [place], and D/C home via private auto.  Erasmo Leventhal, RN 06/03/2020 1:21 PM

## 2020-06-03 NOTE — Progress Notes (Signed)
Progress Note  Patient Name: Leslie [name] Date of Encounter: 06/03/2020  [facility] Cardiologist: Sanda Klein, MD   Subjective   Minimally negative yesterday - creatinine improved today at 1.61. noted to have some iron-deficiency anemia.  Inpatient Medications    Scheduled Meds: . apixaban  5 mg Oral BID  . atorvastatin  10 mg Oral Daily  . EPINEPHrine  0.3 mg Intramuscular Once  . ferrous sulfate  325 mg Oral BID WC  . flecainide  50 mg Oral BID  . irbesartan  150 mg Oral Daily  . levothyroxine  50 mcg Oral QAC breakfast  . magnesium oxide  400 mg Oral Daily  . metoprolol succinate  100 mg Oral Daily   And  . metoprolol succinate  50 mg Oral QHS  . pantoprazole  40 mg Oral Daily  . verapamil  180 mg Oral BID   Continuous Infusions:  PRN Meds: acetaminophen **OR** acetaminophen   Vital Signs    Vitals:   06/02/20 1702 06/02/20 2330 06/03/20 0617 06/03/20 0810  BP: (!) 115/50 119/64 115/62 (!) 141/72  Pulse: 61 72 71 73  Resp: 18 19 18    Temp: 98.7 F (37.1 C) 97.7 F (36.5 C) 97.9 F (36.6 C)   TempSrc: Oral Oral Oral   SpO2: 98% 95% 98%   Weight:   119.8 kg   Height:        Intake/Output Summary (Last 24 hours) at 06/03/2020 1106 Last data filed at 06/03/2020 0618 Gross per 24 hour  Intake 240 ml  Output 300 ml  Net -60 ml   Last 3 Weights 06/03/2020 06/02/2020 06/01/2020  Weight (lbs) 264 lb 1.6 oz 263 lb 12.8 oz 267 lb 6.7 oz  Weight (kg) 119.795 kg 119.659 kg 121.3 kg      Telemetry    Sinus rhythm - Personally Reviewed  ECG    No new tracings - Personally Reviewed  Physical Exam   GEN: No acute distress.   Neck: No JVD Cardiac: RRR, no murmurs, rubs, or gallops.  Respiratory: Clear to auscultation bilaterally. GI: Soft, nontender, non-distended  MS: trace RLE edema (chronic); No deformity. Neuro:  Nonfocal  Psych: Normal affect   Labs    High Sensitivity Troponin:  No results for input(s): TROPONINIHS in the last 720  hours.    Chemistry Recent Labs  Lab 06/01/20 0313 06/02/20 0009 06/03/20 0241  NA 139 137 138  K 4.5 4.2 4.1  CL 103 100 101  CO2 26 26 24   GLUCOSE 95 91 90  BUN 17 28* 34*  CREATININE 1.37* 1.64* 1.61*  CALCIUM 8.5* 8.5* 8.4*  GFRNONAA 41* 33* 33*  ANIONGAP 10 11 13      Hematology Recent Labs  Lab 05/30/20 2050 05/31/20 0500 06/02/20 0009  WBC 17.5* 12.6* 12.2*  RBC 4.95 4.46 4.82  HGB 10.7* 9.9* 10.4*  HCT 37.7 32.8* 35.7*  MCV 76.2* 73.5* 74.1*  MCH 21.6* 22.2* 21.6*  MCHC 28.4* 30.2 29.1*  RDW 17.1* 17.0* 17.1*  PLT 382 360 405*    BNP Recent Labs  Lab 05/30/20 2050  BNP 306.6*     DDimer No results for input(s): DDIMER in the last 168 hours.   Radiology    ECHOCARDIOGRAM COMPLETE  Result Date: 06/01/2020    ECHOCARDIOGRAM REPORT   Patient Name:   AMOURA RANSIER Date of Exam: 06/01/2020 Medical Rec #:  [id number]       Height:       66.0 in Accession #:  [id number]      Weight:       267.4 lb Date of Birth:  [birth year]       BSA:          2.263 m Patient Age:    75 years        BP:           142/83 mmHg Patient Gender: F               HR:           74 bpm. Exam Location:  Inpatient Procedure: 2D Echo Indications:    CHF-Acute Diastolic Q67.61  History:        Patient has prior history of Echocardiogram examinations, most                 recent 07/08/2019. Arrythmias:Atrial Fibrillation; Risk                 Factors:Hypertension, Diabetes and Dyslipidemia.  Sonographer:    Mikki Santee RDCS (AE) Referring Phys: [id number] [place]  1. Left ventricular ejection fraction, by estimation, is 55 to 60%. The left ventricle has normal function. The left ventricle has no regional wall motion abnormalities. Left ventricular diastolic parameters are consistent with Grade II diastolic dysfunction (pseudonormalization).  2. Right ventricular systolic function is normal. The right ventricular size is normal. There is normal pulmonary artery systolic  pressure.  3. Left atrial size was moderately dilated.  4. The mitral valve is normal in structure. Mild mitral valve regurgitation. No evidence of mitral stenosis.  5. The aortic valve is tricuspid. Aortic valve regurgitation is moderate. No aortic stenosis is present.  6. The inferior vena cava is normal in size with greater than 50% respiratory variability, suggesting right atrial pressure of 3 mmHg. Comparison(s): No significant change from prior study. FINDINGS  Left Ventricle: Left ventricular ejection fraction, by estimation, is 55 to 60%. The left ventricle has normal function. The left ventricle has no regional wall motion abnormalities. The left ventricular internal cavity size was normal in size. There is  no left ventricular hypertrophy. Left ventricular diastolic parameters are consistent with Grade II diastolic dysfunction (pseudonormalization). Right Ventricle: The right ventricular size is normal. Right vetricular wall thickness was not well visualized. Right ventricular systolic function is normal. There is normal pulmonary artery systolic pressure. The tricuspid regurgitant velocity is 1.89 m/s, and with an assumed right atrial pressure of 3 mmHg, the estimated right ventricular systolic pressure is 71.2 mmHg. Left Atrium: Left atrial size was moderately dilated. Right Atrium: Right atrial size was normal in size. Pericardium: There is no evidence of pericardial effusion. Presence of pericardial fat pad. Mitral Valve: The mitral valve is normal in structure. Mild mitral valve regurgitation. No evidence of mitral valve stenosis. Tricuspid Valve: The tricuspid valve is normal in structure. Tricuspid valve regurgitation is mild . No evidence of tricuspid stenosis. Aortic Valve: The aortic valve is tricuspid. Aortic valve regurgitation is moderate. Aortic regurgitation PHT measures 682 msec. No aortic stenosis is present. Pulmonic Valve: The pulmonic valve was not well visualized. Pulmonic valve  regurgitation is trivial. No evidence of pulmonic stenosis. Aorta: The aortic root, ascending aorta and aortic arch are all structurally normal, with no evidence of dilitation or obstruction. Venous: The inferior vena cava is normal in size with greater than 50% respiratory variability, suggesting right atrial pressure of 3 mmHg. IAS/Shunts: The atrial septum is grossly normal.  LEFT VENTRICLE PLAX 2D LVIDd:  5.40 cm  Diastology LVIDs:         3.70 cm  LV e' medial:    5.55 cm/s LV PW:         0.90 cm  LV E/e' medial:  17.9 LV IVS:        0.90 cm  LV e' lateral:   7.18 cm/s LVOT diam:     1.90 cm  LV E/e' lateral: 13.8 LV SV:         79 LV SV Index:   35 LVOT Area:     2.84 cm  RIGHT VENTRICLE TAPSE (M-mode): 1.7 cm LEFT ATRIUM             Index       RIGHT ATRIUM           Index LA diam:        3.80 cm 1.68 cm/m  RA Area:     17.30 cm LA Vol (A2C):   77.3 ml 34.16 ml/m RA Volume:   37.10 ml  16.40 ml/m LA Vol (A4C):   56.1 ml 24.79 ml/m LA Biplane Vol: 67.6 ml 29.88 ml/m  AORTIC VALVE LVOT Vmax:   131.00 cm/s LVOT Vmean:  81.600 cm/s LVOT VTI:    0.277 m AI PHT:      682 msec  AORTA Ao Root diam: 2.90 cm MITRAL VALVE               TRICUSPID VALVE MV Area (PHT): 2.83 cm    TR Peak grad:   14.3 mmHg MV Decel Time: 268 msec    TR Vmax:        189.00 cm/s MV E velocity: 99.40 cm/s MV A velocity: 66.00 cm/s  SHUNTS MV E/A ratio:  1.51        Systemic VTI:  0.28 m                            Systemic Diam: 1.90 cm Buford Dresser MD Electronically signed by Buford Dresser MD Signature Date/Time: 06/01/2020/8:33:41 PM    Final     Cardiac Studies   NST 05/25/20:  There was no ST segment deviation noted during stress.  The study is normal.  This is a low risk study.   Normal pharmacologic nuclear stress test with no evidence for prior infarct or ischemia. LVEF not calculated due to atrial fibrillation.   Echocardiogram 06/2019: 1. Left ventricular ejection fraction, by estimation,  is 60 to 65%. The  left ventricle has normal function. The left ventricle has no regional  wall motion abnormalities. Left ventricular diastolic parameters are  consistent with Grade II diastolic  dysfunction (pseudonormalization). Elevated left ventricular end-diastolic  pressure. The average left ventricular global longitudinal strain is -20.5  %.  2. Right ventricular systolic function is normal. The right ventricular  size is normal. Tricuspid regurgitation signal is inadequate for assessing  PA pressure.  3. Left atrial size was moderately dilated.  4. The mitral valve is normal in structure. Mild mitral valve  regurgitation. No evidence of mitral stenosis.  5. The aortic valve is normal in structure. Aortic valve regurgitation is  moderate. No aortic stenosis is present. Aortic regurgitation PHT measures  341 msec.  6. The inferior vena cava is normal in size with greater than 50%  respiratory variability, suggesting right atrial pressure of 3 mmHg.   Comparison(s): 07/21/16 EF 55-60%.   Patient Profile     75 y.o. female  with a PMH of obesity, OSA on CPAP therapy, persistent atrial fibrillation, HTN, HLD, hypothyroidism and stage II CKD who is being followed by cardiology for the evaluation of acute CHF.  Assessment & Plan    1. Acute CHF: patient presented with progressive dyspnea following successful DCCV earlier 03/29/21. Her last echocardiogram was 06/2019 with EF 60-65% and indeterminate LV diastolic fu nction in the setting of Afib. Possibly she has developed LV systolic dysfunction since that time. BNP elevated to 300s on admission. CXR with mild-moderate interstitial edema. She was started on IV lasix 40mg  BID with UOP net -3.2 L. Cr improved to 1.61 today. - Echo shows normal LVEF, grade 2 DD, moderate AI - Resume po lasix at 40 mg daily tomorrow - Repeat BMET middle of next week (please order for outpatient lab) - Continue metoprolol succinate and irbesartan -  Continue to monitor strict I&Os and daily weights - Continue to monitor electrolytes and replete as needed to maintain K >4, Mg >2  2. Persistent atrial fibrillation: initially diagnosed following 2nd COVID vaccine in 2021. She underwent failed DCCV and was subsequently started on flecainide following a negative NST. She underwent repeat DCCV 05/30/20 which was successful. She continues to maintain sinus rhythm this admission - Continue metoprolol and verapamil for rate control - Continue flecainide for rhythm control - Continue Eliquis for stroke ppx  3. HTN: BP is generally at goal - Continue metoprolol, irbesartan, and verapamil  4. HLD: no recent lipids on file - Continue atorvastatin    5. Anemia: appears iron deficient. Ordered for iron, but she has concerns about constipation. ?IV iron x 1 today prior to d/c?  Ok to d/c home today. Follow-up in afib clinic as scheduled.  For questions or updates, please contact [place] Please consult [email or web address] for contact info under   Pixie Casino, MD, FACC, [place] Director of the [facility] Diplomate of the American Board of Clinical Lipidology Attending Cardiologist  Direct Dial: [phone]  Fax: [phone]  Website:  [email or web address]  Pixie Casino, MD  06/03/2020, 11:06 AM

## 2020-06-07 ENCOUNTER — Other Ambulatory Visit: Payer: Self-pay

## 2020-06-07 DIAGNOSIS — I4819 Other persistent atrial fibrillation: Secondary | ICD-10-CM

## 2020-06-07 DIAGNOSIS — I25119 Atherosclerotic heart disease of native coronary artery with unspecified angina pectoris: Secondary | ICD-10-CM

## 2020-06-07 LAB — BASIC METABOLIC PANEL
BUN/Creatinine Ratio: 17 (ref 12–28)
BUN: 24 mg/dL (ref 8–27)
CO2: 20 mmol/L (ref 20–29)
Calcium: 8.6 mg/dL — ABNORMAL LOW (ref 8.7–10.3)
Chloride: 102 mmol/L (ref 96–106)
Creatinine, Ser: 1.43 mg/dL — ABNORMAL HIGH (ref 0.57–1.00)
GFR calc Af Amer: 42 mL/min/{1.73_m2} — ABNORMAL LOW (ref >59)
GFR calc non Af Amer: 36 mL/min/{1.73_m2} — ABNORMAL LOW (ref >59)
Glucose: 97 mg/dL (ref 65–99)
Potassium: 4.7 mmol/L (ref 3.5–5.2)
Sodium: 137 mmol/L (ref 134–144)

## 2020-06-07 NOTE — Progress Notes (Signed)
bmet  

## 2020-06-09 DIAGNOSIS — I10 Essential (primary) hypertension: Secondary | ICD-10-CM | POA: Diagnosis not present

## 2020-06-09 DIAGNOSIS — D72828 Other elevated white blood cell count: Secondary | ICD-10-CM | POA: Diagnosis not present

## 2020-06-09 DIAGNOSIS — N179 Acute kidney failure, unspecified: Secondary | ICD-10-CM | POA: Diagnosis not present

## 2020-06-14 ENCOUNTER — Other Ambulatory Visit: Payer: Self-pay

## 2020-06-14 ENCOUNTER — Encounter (HOSPITAL_COMMUNITY): Payer: Self-pay | Admitting: Nurse Practitioner

## 2020-06-14 ENCOUNTER — Ambulatory Visit (HOSPITAL_COMMUNITY)
Admission: RE | Admit: 2020-06-14 | Discharge: 2020-06-14 | Disposition: A | Discharge status: Home / Self Care | Payer: PPO | Source: Ambulatory Visit | Attending: Nurse Practitioner | Admitting: Nurse Practitioner

## 2020-06-14 VITALS — BP 132/72 | HR 58 | Ht 66.0 in | Wt 266.8 lb

## 2020-06-14 DIAGNOSIS — I509 Heart failure, unspecified: Secondary | ICD-10-CM | POA: Insufficient documentation

## 2020-06-14 DIAGNOSIS — N183 Chronic kidney disease, stage 3 unspecified: Secondary | ICD-10-CM | POA: Diagnosis not present

## 2020-06-14 DIAGNOSIS — Z7901 Long term (current) use of anticoagulants: Secondary | ICD-10-CM | POA: Insufficient documentation

## 2020-06-14 DIAGNOSIS — G4733 Obstructive sleep apnea (adult) (pediatric): Secondary | ICD-10-CM | POA: Diagnosis not present

## 2020-06-14 DIAGNOSIS — I130 Hypertensive heart and chronic kidney disease with heart failure and stage 1 through stage 4 chronic kidney disease, or unspecified chronic kidney disease: Secondary | ICD-10-CM | POA: Insufficient documentation

## 2020-06-14 DIAGNOSIS — Z87891 Personal history of nicotine dependence: Secondary | ICD-10-CM | POA: Insufficient documentation

## 2020-06-14 DIAGNOSIS — I4819 Other persistent atrial fibrillation: Secondary | ICD-10-CM

## 2020-06-14 DIAGNOSIS — I4891 Unspecified atrial fibrillation: Secondary | ICD-10-CM | POA: Diagnosis present

## 2020-06-14 DIAGNOSIS — D6869 Other thrombophilia: Secondary | ICD-10-CM | POA: Diagnosis not present

## 2020-06-14 NOTE — Progress Notes (Signed)
Thank you :)

## 2020-06-14 NOTE — Progress Notes (Signed)
Primary Care Physician: Glenis Smoker, MD Primary Cardiologist: Dr Sallyanne Kuster Primary Electrophysiologist: none Referring Physician: [facility] triage   Leslie [name] is a 75 y.o. female with a history of CKD, HLD, HTN, hypothyroidism, mild-mod MR, mod AI, OSA, and atrial fibrillation who presents for consultation in the [facility].  The patient was initially diagnosed with atrial fibrillation 07/2016 and had DCCV at that time. Beta-blocker dose was decreased in May 2018 when she had junctional rhythm.  After she received her second dose of coronavirus vaccine in February 2021 she developed symptomatic persistent atrial fibrillation with RVR, again challenging to rate control and underwent cardioversion on June 24, 2019. Patient is on Eliquis for a CHADS2VASC score of 3.   She went back  into the afib 04/29/20  with RVR. She called into the answering service and spoke to [place], [name]. He increased her dose of metoprolol to 100 mg in  the pm (usual dose  100 mg  in am and 50 mg in pm) Her v range at home is 90's to 120. She feels she went into afib with worries over a sick cat and the weather. She  does feel fatigue and shortness of breath with activity in afib. She is compliant with CPAP, minimal caffeine, no alcohol, no tobacco. No missed anticoagulation. Has had all vaccines.  F/u successful cardioversion, 05/09/20. unfortunately she is back in afib with RVR. Dicussed antiarrythmic's with pt and with Dr. Loletha Grayer. We discussed use of flecainide as she does not want to use amiodarone with her known thyroid disease. She  will need a stress test which Dr. Loletha Grayer will coordinate and then will get loaded on flecainide with a low risk test and try to get cardioverted in a timely manner. Pt is in agreement. No missed anticoagulation. No conduction issues on ekg's in SR. EF was normal on latest echo.   F/u in afib clinic, 06/14/20. She had a low risk stress test, and underwent a  successful cardioversion 2/15. Later in the day she became increasingly more short of breath and presented to  the [facility] ER. She was admitted with acute congestive HF and diuresed 7 lbs. She was discharged on lasix 40 mg daily. She remained in SR post cardioversion.  In the afib clinic today, she feels improved but some weakness still present. She saw her PCP a few days after d/c and BP/HR on the lower side and was tod to decrease bb to 50 mg bid. HR's and BP's form home reviewed and look stable.  EKG shows SR at 58 bpm, pr int 184 ms, qrs int 88 ms, qtc 429 ms,  with flecainide  50 mg bid continuing. Her PCP is sending her to a hematologist for abnormal cbc. Bmet  was checked with PCP last week and stable. .   Today, she denies symptoms of  palpitations, chest pain,+ shortness of breath/fatigue, no  orthopnea, PND, lower extremity edema, dizziness, presyncope, syncope, snoring, daytime somnolence, bleeding, or neurologic sequela. The patient is tolerating medications without difficulties and is otherwise without complaint today.    Atrial Fibrillation Risk Factors:  she does have symptoms or diagnosis of sleep apnea. she is compliant with CPAP therapy. she does not have a history of rheumatic fever. she does not have a history of alcohol use. The patient does not have a history of early familial atrial fibrillation or other arrhythmias.  she has a BMI of There is no height or weight on file  to calculate BMI.. There were no vitals filed for this visit.  Family History  Problem Relation Age of Onset  . Heart failure Mother   . Hypertension Mother   . Hypertension Father   . Emphysema Father      Atrial Fibrillation Management history:  Previous antiarrhythmic drugs: none Previous cardioversions: 08/01/2016, 06/24/19 Previous ablations: none CHADS2VASC score:  Anticoagulation history: Eliquis   Past Medical History:  Diagnosis Date  . Adenomatous polyp   . Arthritis    "knees, legs,  fingers" (07/30/2016)  . Asthma   . Bursitis of left shoulder    "just finished PT" (07/30/2016)  . Chest pain    a. 2003 Abnl stress test-->Cath: nonobs CAD.  Marland Kitchen Chronic bronchitis ([place])   . Chronic kidney disease    stage 3  . GERD (gastroesophageal reflux disease)    barrett's esophagus- Dr. Cristina Gong  . History of hiatal hernia   . Hyperlipidemia   . Hypertension   . Hypothyroidism   . Migraines    "sporatic; at least a few/year" (07/30/2016)  . Mitral regurgitation    a. 07/2016 Echo: mild to mod MR;  b. 07/2016 TEE: mild MR.  . Moderate aortic insufficiency    a. 07/2016 Echo: EF 55-60%, mod AI;  b. 07/2016 TEE: EF 50-55%, mod AI.  Marland Kitchen Obesity    s/p lap band surgery 6/10  . OSA on CPAP   . PAF (paroxysmal atrial fibrillation) ([place])    a. 07/2016 TEE/DCCV: EF 50-55%, mild MR, mod AI, mild to mod TR, neg bubble study-->successful DCCV x1 w/ 120J.  Marland Kitchen PONV (postoperative nausea and vomiting)   . Vitamin D deficiency    Past Surgical History:  Procedure Laterality Date  . APPENDECTOMY     incidental  . CARDIOVERSION N/A 08/01/2016   Procedure: CARDIOVERSION;  Surgeon: Jerline Pain, MD;  Location: [facility];  Service: Cardiovascular;  Laterality: N/A;  . CARDIOVERSION N/A 06/24/2019   Procedure: CARDIOVERSION;  Surgeon: Sanda Klein, MD;  Location: [place] ENDOSCOPY;  Service: Cardiovascular;  Laterality: N/A;  . CARDIOVERSION N/A 05/09/2020   Procedure: CARDIOVERSION;  Surgeon: [name], MD;  Location: [facility] ENDOSCOPY;  Service: Cardiovascular;  Laterality: N/A;  . CARDIOVERSION N/A 05/30/2020   Procedure: CARDIOVERSION;  Surgeon: Sanda Klein, MD;  Location: [facility] ENDOSCOPY;  Service: Cardiovascular;  Laterality: N/A;  . CATARACT EXTRACTION W/ INTRAOCULAR LENS  IMPLANT, BILATERAL Bilateral ~ 2008-2017   left - right  . COMBINED HYSTERECTOMY VAGINAL / OOPHORECTOMY / A&P REPAIR  1981  . HERNIA REPAIR    . INGUINAL HERNIA REPAIR Left 1952  . LAPAROSCOPIC GASTRIC BANDING  WITH HIATAL HERNIA REPAIR  09/2008  . TEE WITHOUT CARDIOVERSION N/A 08/01/2016   Procedure: TRANSESOPHAGEAL ECHOCARDIOGRAM (TEE);  Surgeon: Jerline Pain, MD;  Location: [facility];  Service: Cardiovascular;  Laterality: N/A;  . [place]  . VAGINAL HYSTERECTOMY  1981   "w/1 ovary"    Current Outpatient Medications  Medication Sig Dispense Refill  . acetaminophen (TYLENOL) 650 MG CR tablet Take 1,300 mg by mouth every 8 (eight) hours as needed for pain.    Marland Kitchen atorvastatin (LIPITOR) 10 MG tablet Take 10 mg by mouth daily.    . Bacillus Coagulans-Inulin (ALIGN PREBIOTIC-PROBIOTIC PO) Take 2 capsules by mouth daily. Gummy    . cholecalciferol (VITAMIN D) 1000 UNITS tablet Take 1,000 Units by mouth daily.    . diclofenac Sodium (VOLTAREN) 1 % GEL Apply 1 application topically 2 (two) times daily.    Marland Kitchen  ELIQUIS 5 MG TABS tablet TAKE 1 TABLET(5 MG) BY MOUTH TWICE DAILY (Patient taking differently: Take 5 mg by mouth 2 (two) times daily.) 180 tablet 1  . ferrous sulfate 325 (65 FE) MG tablet Take 1 tablet (325 mg total) by mouth 2 (two) times daily with a meal. 60 tablet 0  . fexofenadine (ALLEGRA) 180 MG tablet Take 180 mg by mouth daily.    . flecainide (TAMBOCOR) 50 MG tablet Take 1 tablet (50 mg total) by mouth 2 (two) times daily. 60 tablet 3  . fluticasone (FLONASE) 50 MCG/ACT nasal spray Place 2 sprays into both nostrils at bedtime.     . fluticasone (FLOVENT HFA) 44 MCG/ACT inhaler Inhale 2 puffs into the lungs 2 (two) times daily. (Patient taking differently: Inhale 2 puffs into the lungs every evening.) 1 Inhaler 0  . furosemide (LASIX) 40 MG tablet TAKE 1 TABLET(40 MG) BY MOUTH DAILY 90 tablet 1  . Glucosamine-Chondroitin (OSTEO BI-FLEX REGULAR STRENGTH PO) Take 1 tablet by mouth daily.    . irbesartan (AVAPRO) 150 MG tablet TAKE 1 TABLET(150 MG) BY MOUTH DAILY (Patient taking differently: Take 150 mg by mouth daily.) 90 tablet 3  . levothyroxine (SYNTHROID,  LEVOTHROID) 50 MCG tablet Take 50 mcg by mouth daily before breakfast.    . MAGNESIUM-OXIDE 400 (241.3 Mg) MG tablet TAKE 1 TABLET BY MOUTH DAILY (Patient taking differently: Take 400 mg by mouth daily.) 90 tablet 3  . metoprolol succinate (TOPROL-XL) 100 MG 24 hr tablet Take 100 mg (one tablet) in the morning and 50 mg (half a tablet) in the evening. (Patient taking differently: Take 100 mg by mouth See admin instructions. Take 100 mg (one tablet) in the morning and 50 mg (half a tablet) in the evening.) 135 tablet 3  . metroNIDAZOLE (METROCREAM) 0.75 % cream Apply 1 application topically daily.    . mometasone (ELOCON) 0.1 % cream Apply 1 application topically 2 (two) times daily as needed (skin irritation).    . Multiple Vitamins-Minerals (CENTRUM MULTIGUMMIES) CHEW Chew 1 tablet by mouth daily.    Marland Kitchen omeprazole (PRILOSEC) 20 MG capsule Take 40 mg by mouth daily before breakfast.    . Psyllium (METAMUCIL) 28.3 % POWD Take 1 Scoop by mouth daily.    Marland Kitchen senna (SENOKOT) 8.6 MG tablet Take 1 tablet by mouth every evening.    . verapamil (CALAN-SR) 180 MG CR tablet Take 180 mg by mouth 2 (two) times daily.     No current facility-administered medications for this encounter.    Allergies  Allergen Reactions  . Clindamycin Hcl     Lip and face swelling  . Augmentin [Amoxicillin-Pot Clavulanate] Diarrhea    Did it involve swelling of the face/tongue/throat, SOB, or low BP? No Did it involve sudden or severe rash/hives, skin peeling, or any reaction on the inside of your mouth or nose? No Did you need to seek medical attention at a hospital or doctor's office? No When did it last happen?10 + years If all above answers are "NO", may proceed with cephalosporin use.   . Codeine Nausea Only and Other (See Comments)    Hyperactivity  . Demerol [Meperidine] Nausea And Vomiting  . Sulfa Antibiotics Rash    Social History   Socioeconomic History  . Marital status: Divorced    Spouse name:  Not on file  . Number of children: Not on file  . Years of education: Not on file  . Highest education level: Not on file  Occupational History  .  Not on file  Tobacco Use  . Smoking status: Former Research scientist (life sciences)  . Smokeless tobacco: Never Used  . Tobacco comment: 07/30/2016 "social smoker only; in college"  Vaping Use  . Vaping Use: Never used  Substance and Sexual Activity  . Alcohol use: Yes    Alcohol/week: 2.0 standard drinks    Types: 1 Glasses of wine, 1 Standard drinks or equivalent per week    Comment: 07/30/2016 "couple drinks/month"  . Drug use: No  . Sexual activity: Not Currently  Other Topics Concern  . Not on file  Social History Narrative  . Not on file   Social Determinants of Health   Financial Resource Strain: Not on file  Food Insecurity: Not on file  Transportation Needs: Not on file  Physical Activity: Not on file  Stress: Not on file  Social Connections: Not on file  Intimate Partner Violence: Not on file     ROS- All systems are reviewed and negative except as per the HPI above.  Physical Exam: There were no vitals filed for this visit.  GEN- The patient is well appearing, alert and oriented x 3 today.   Head- normocephalic, atraumatic Eyes-  Sclera clear, conjunctiva pink Ears- hearing intact Oropharynx- clear Neck- supple  Lungs- Clear to ausculation bilaterally, normal work of breathing Heart- regular rate and rhythm, no murmurs, rubs or gallops  GI- soft, NT, ND, + BS Extremities- no clubbing, cyanosis, or edema MS- no significant deformity or atrophy Skin- no rash or lesion Psych- euthymic mood, full affect Neuro- strength and sensation are intact  Wt Readings from Last 3 Encounters:  06/03/20 119.8 kg  05/24/20 124.3 kg  05/16/20 124.5 kg    EKG today demonstrates Sinus brady at 58 bpm, pr int 184 ms, qrs int 88 ms, qtc 429 ms    Echo-06/01/20-   1. Left ventricular ejection fraction, by estimation, is 55 to 60%. The  left  ventricle has normal function. The left ventricle has no regional  wall motion abnormalities. Left ventricular diastolic parameters are  consistent with Grade II diastolic  dysfunction (pseudonormalization).  2. Right ventricular systolic function is normal. The right ventricular  size is normal. There is normal pulmonary artery systolic pressure.  3. Left atrial size was moderately dilated.  4. The mitral valve is normal in structure. Mild mitral valve  regurgitation. No evidence of mitral stenosis.  5. The aortic valve is tricuspid. Aortic valve regurgitation is moderate.  No aortic stenosis is present.  6. The inferior vena cava is normal in size with greater than 50%  respiratory variability, suggesting right atrial pressure of 3 mmHg.   CHA2DS2-VASc Score = 4  The patient's score is based upon: CHF History: Yes HTN History: Yes Diabetes History: No Stroke History: No Vascular Disease History: No Age Score: 1 Gender Score: 1    [id number]    ASSESSMENT AND PLAN: 1. Persistent Atrial Fibrillation (ICD10:  I48.19) The patient's CHA2DS2-VASc score is 4, indicating a 4.8% annual risk of stroke.   Cardioversion 1/25 was successful but went  back into afib 1/29 with RVR after 4 days    Discussed  antiarrythmic's with pt and after discussion with Dr. Loletha Grayer, she  proceeded with flecainide load after a low risk stress test and cardioversion . Had worsening shortness of breath after the procedure and was admitted for diuresis.  She stayed in rhythm during admit  and continues in SR today  Continue metoprolol at 50 mg bid  continue flecainide  50  mg bid   2. Secondary Hypercoagulable State (ICD10:  D68.69) The patient is at significant risk for stroke/thromboembolism based upon her CHA2DS2-VASc Score of 4.  Continue Apixaban (Eliquis).  States no missed anticoagulation   3. Obstructive sleep apnea Pt is very compliant with CPAP use   4. HTN Stable, no changes today.  5.  CHF Encouraged daily weight and avoiding salt    F/u with Dr. Loletha Grayer as scheduled 5/24 Afib clinic as needed if breakthrough afib  Butch Penny C. Zeniah Briney, [facility] [address] [place], [place] [address] [phone]

## 2020-06-16 ENCOUNTER — Telehealth: Payer: Self-pay | Admitting: Physician Assistant

## 2020-06-16 NOTE — Telephone Encounter (Signed)
Scheduled appointment per 3/3 new patient referral. Spoke to patient who is aware of appointment date and time.

## 2020-06-20 NOTE — Progress Notes (Signed)
[place] [phone]   [phone]  CONSULT NOTE  REFERRING PHYSICIAN: Dr. Lindell Noe  REASON FOR CONSULTATION:  Leukocytosis and anemia  HPI [name] is a 75 y.o. female with a past medical history significant for hypertension, hysterectomy, atrial fibrillation on anti-coagulation since 2018, GERD/Barett's esophagus, s/p lap band surgery 6/10, migraines, asthma, allergic rhinitis, OSA, valvular disease, congestive heart failure, hypothyroidism, chronic kidney disease, arthritis, hyperlipidemia, and eczema is referred to the clinic for leukocytosis.  The patient was recently hospitalized from 05/30/20-06/03/20 for acute diastolic heart failure and persistent atrial fibrillation. She is on Eliquis for her atrial fibrillation and has been on it since about ~2018. It was mentioned during her hospitalization that she had chronic leukocytosis. Also during her hospitalization, she had iron studies performed which showed iron deficiency anemia. Her ferritin was 36, her iron was low at 24, her saturation ratio is low at 5%. Due to intolerance to oral iron supplements secondary to constipation, she received 1 dose of IV iron on 06/03/20 with Nulecit. She does take a multivitamin with iron though. She has been struggling with significant constipation for which she follows closely with her gastroenterologist, Dr. Cristina Gong. She states she has an upcoming appointment with him soon to discuss her hx of Barrett's esophagitis, her constipation, and also occasional bright red blood per rectum with constipation. She states her last colonoscopy was about 2-3 years ago and was unremarkable. No records are available to me. She denies history of hemorrhoids.  She had a hospital follow-up appointment with her primary care provider on 06/15/2020.  Due to the patient's chronic leukocytosis, the patient was referred to our clinic today for further evaluation regarding these  findings.  Per chart review, it appears that the patient has had evidence of leukocytosis for approximately 11 years or so, which is the oldest records available to me. The patient's white blood cell count has ranged from ~10K to 20K.To the patient's knowledge, she does not recall being told about her leukocytosis before. The patient denies any recent signs and symptoms of infection except she had a dental abscess which she estimates was in January 2022.  She does not get sick frequently. She denies upper respiratory infections, skin infections, dysuria, diarrhea, or sore throat. She denies any recent fevers. She denies night sweats. She denies unintentionally weight loss except with some weight loss in the hospital. She denies lymphadenopathy or early satiety. Denies significant abdominal discomfort. The patientis a non-smoker, but used to smoke socially when she was in her 63's. She estimates she smoked about 4-5 cigarettes per week.The patient denies any recent steroid or prednisone use except for her Flovent inhaler which she uses 1x per day. She will sometimes use it twice a day if her allergies are flaring up.The patient denies any history of viral infections such as HIV or hepatitis but she had mono in college.The patient denies any history of any diagnosedautoimmune disorders. She does have significant osteoarthritis in multiple joints. She reports some fatigue due to being deconditioned after her recent hospitalization.   Regarding her anemia, she is on Prilosec daily due to her history of Barett'sesophagitis and reflux. She denies any other bleeding or bruising including epistaxis, hemoptysis, hematemesis, or hematuria. Per chart review, she started having evidence of anemia in January 2022. She did mention that she started having the rectal bleeding for the last "several months". The blood is not mixed in with the stool. She does notice bleeding if constipated/straining. Besides the  iron  deficiency, she denies any B12 or folate deficiency to her knowledge. As previously mentioned, she had a lap band surgery performed in 2010. She has been trying to increase her dietary intake of iron with spinach. She does not like red meat.   Her family history consists of a mother with CHF/pulmonary edema. Her father had what sounds like is polycythemia as he would require routine phlebotomy. He also had a ruptured esophagus and COPD. Her brother has reflux and "spine/back issues".   The patient is divorced. She has 2 kids. She used to work as an Futures trader. She denies drug use. She estimates she has 1-2 alcoholic beverages per month. She is a former social smoker. She stopped smoking when she was 51. She estimates she smoked about 4-5 cigarettes per week.    Past Medical History:  Diagnosis Date  . Adenomatous polyp   . Arthritis    "knees, legs, fingers" (07/30/2016)  . Asthma   . Bursitis of left shoulder    "just finished PT" (07/30/2016)  . Chest pain    a. 2003 Abnl stress test-->Cath: nonobs CAD.  Marland Kitchen Chronic bronchitis ([place])   . Chronic kidney disease    stage 3  . GERD (gastroesophageal reflux disease)    barrett's esophagus- Dr. Cristina Gong  . History of hiatal hernia   . Hyperlipidemia   . Hypertension   . Hypothyroidism   . Migraines    "sporatic; at least a few/year" (07/30/2016)  . Mitral regurgitation    a. 07/2016 Echo: mild to mod MR;  b. 07/2016 TEE: mild MR.  . Moderate aortic insufficiency    a. 07/2016 Echo: EF 55-60%, mod AI;  b. 07/2016 TEE: EF 50-55%, mod AI.  Marland Kitchen Obesity    s/p lap band surgery 6/10  . OSA on CPAP   . PAF (paroxysmal atrial fibrillation) ([place])    a. 07/2016 TEE/DCCV: EF 50-55%, mild MR, mod AI, mild to mod TR, neg bubble study-->successful DCCV x1 w/ 120J.  Marland Kitchen PONV (postoperative nausea and vomiting)   . Vitamin D deficiency     Past Surgical History:  Procedure Laterality Date  . APPENDECTOMY     incidental  . CARDIOVERSION N/A 08/01/2016    Procedure: CARDIOVERSION;  Surgeon: Jerline Pain, MD;  Location: [place];  Service: Cardiovascular;  Laterality: N/A;  . CARDIOVERSION N/A 06/24/2019   Procedure: CARDIOVERSION;  Surgeon: Sanda Klein, MD;  Location: [place] ENDOSCOPY;  Service: Cardiovascular;  Laterality: N/A;  . CARDIOVERSION N/A 05/09/2020   Procedure: CARDIOVERSION;  Surgeon: [name], MD;  Location: [facility] ENDOSCOPY;  Service: Cardiovascular;  Laterality: N/A;  . CARDIOVERSION N/A 05/30/2020   Procedure: CARDIOVERSION;  Surgeon: Sanda Klein, MD;  Location: [place] ENDOSCOPY;  Service: Cardiovascular;  Laterality: N/A;  . CATARACT EXTRACTION W/ INTRAOCULAR LENS  IMPLANT, BILATERAL Bilateral ~ 2008-2017   left - right  . COMBINED HYSTERECTOMY VAGINAL / OOPHORECTOMY / A&P REPAIR  1981  . HERNIA REPAIR    . INGUINAL HERNIA REPAIR Left 1952  . LAPAROSCOPIC GASTRIC BANDING WITH HIATAL HERNIA REPAIR  09/2008  . TEE WITHOUT CARDIOVERSION N/A 08/01/2016   Procedure: TRANSESOPHAGEAL ECHOCARDIOGRAM (TEE);  Surgeon: Jerline Pain, MD;  Location: [place];  Service: Cardiovascular;  Laterality: N/A;  .   . VAGINAL HYSTERECTOMY  1981   "w/1 ovary"    Family History  Problem Relation Age of Onset  . Heart failure Mother   . Hypertension Mother   . Hypertension Father   .  Emphysema Father     Social History Social History   Tobacco Use  . Smoking status: Former Research scientist (life sciences)  . Smokeless tobacco: Never Used  . Tobacco comment: 07/30/2016 "social smoker only; in college"  Vaping Use  . Vaping Use: Never used  Substance Use Topics  . Alcohol use: Yes    Alcohol/week: 2.0 standard drinks    Types: 1 Glasses of wine, 1 Standard drinks or equivalent per week    Comment: 07/30/2016 "couple drinks/month"  . Drug use: No    Allergies  Allergen Reactions  . Clindamycin Hcl     Lip and face swelling  . Augmentin [Amoxicillin-Pot Clavulanate] Diarrhea    Did it involve  swelling of the face/tongue/throat, SOB, or low BP? No Did it involve sudden or severe rash/hives, skin peeling, or any reaction on the inside of your mouth or nose? No Did you need to seek medical attention at a hospital or doctor's office? No When did it last happen?10 + years If all above answers are "NO", may proceed with cephalosporin use.   . Codeine Nausea Only and Other (See Comments)    Hyperactivity  . Demerol [Meperidine] Nausea And Vomiting  . Sulfa Antibiotics Rash    Current Outpatient Medications  Medication Sig Dispense Refill  . acetaminophen (TYLENOL) 650 MG CR tablet Take 1,300 mg by mouth every 8 (eight) hours as needed for pain.    Marland Kitchen atorvastatin (LIPITOR) 10 MG tablet Take 10 mg by mouth daily.    . Bacillus Coagulans-Inulin (ALIGN PREBIOTIC-PROBIOTIC PO) Take 2 capsules by mouth daily. Gummy    . cholecalciferol (VITAMIN D) 1000 UNITS tablet Take 1,000 Units by mouth daily.    . diclofenac Sodium (VOLTAREN) 1 % GEL Apply 1 application topically 2 (two) times daily.    Marland Kitchen ELIQUIS 5 MG TABS tablet TAKE 1 TABLET(5 MG) BY MOUTH TWICE DAILY 180 tablet 1  . fexofenadine (ALLEGRA) 180 MG tablet Take 180 mg by mouth daily.    . flecainide (TAMBOCOR) 50 MG tablet Take 1 tablet (50 mg total) by mouth 2 (two) times daily. 60 tablet 3  . fluticasone (FLONASE) 50 MCG/ACT nasal spray Place 2 sprays into both nostrils at bedtime.     . fluticasone (FLOVENT HFA) 44 MCG/ACT inhaler Inhale 2 puffs into the lungs 2 (two) times daily. 1 Inhaler 0  . furosemide (LASIX) 40 MG tablet TAKE 1 TABLET(40 MG) BY MOUTH DAILY 90 tablet 1  . Glucosamine-Chondroitin (OSTEO BI-FLEX REGULAR STRENGTH PO) Take 1 tablet by mouth daily.    . irbesartan (AVAPRO) 150 MG tablet TAKE 1 TABLET(150 MG) BY MOUTH DAILY 90 tablet 3  . levothyroxine (SYNTHROID, LEVOTHROID) 50 MCG tablet Take 50 mcg by mouth daily before breakfast.    . MAGNESIUM-OXIDE 400 (241.3 Mg) MG tablet TAKE 1 TABLET BY MOUTH DAILY 90  tablet 3  . metoprolol succinate (TOPROL-XL) 100 MG 24 hr tablet Take 100 mg (one tablet) in the morning and 50 mg (half a tablet) in the evening. 135 tablet 3  . metroNIDAZOLE (METROCREAM) 0.75 % cream Apply 1 application topically daily.    . mometasone (ELOCON) 0.1 % cream Apply 1 application topically 2 (two) times daily as needed (skin irritation).    . Multiple Vitamins-Minerals (CENTRUM MULTIGUMMIES) CHEW Chew 1 tablet by mouth daily.    Marland Kitchen omeprazole (PRILOSEC) 20 MG capsule Take 40 mg by mouth daily before breakfast.    . Psyllium (METAMUCIL) 28.3 % POWD Take 1 Scoop by mouth daily.    Marland Kitchen  senna (SENOKOT) 8.6 MG tablet Take 1 tablet by mouth every evening.    . verapamil (CALAN-SR) 180 MG CR tablet Take 180 mg by mouth 2 (two) times daily.     No current facility-administered medications for this visit.    REVIEW OF SYSTEMS:   Review of Systems  Constitutional: Positive for fatigue. Negative for appetite change, chills,  fever and unexpected weight change.  HENT: Negative for mouth sores, nosebleeds, sore throat and trouble swallowing.   Eyes: Negative for eye problems and icterus.  Respiratory: Negative for cough, hemoptysis, shortness of breath and wheezing.   Cardiovascular: Negative for chest pain and leg swelling.  Gastrointestinal: Positive for occasional hematochezia. Negative for abdominal pain, constipation, diarrhea, nausea and vomiting.  Genitourinary: Negative for bladder incontinence, difficulty urinating, dysuria, frequency and hematuria.   Musculoskeletal: Positive for chronic knee pain. Negative for back pain, gait problem, neck pain and neck stiffness.  Skin: Negative for itching and rash.  Neurological: Negative for dizziness, extremity weakness, gait problem, headaches, light-headedness and seizures.  Hematological: Negative for adenopathy. Does not bruise/bleed easily.  Psychiatric/Behavioral: Negative for confusion, depression and sleep disturbance. The patient is  not nervous/anxious.     PHYSICAL EXAMINATION:  There were no vitals taken for this visit.  ECOG PERFORMANCE STATUS: 1 - Symptomatic but completely ambulatory  Physical Exam  Constitutional: Oriented to person, place, and time and well-developed, well-nourished, and in no distress.   HENT:  Head: Normocephalic and atraumatic.  Mouth/Throat: Oropharynx is clear and moist. No oropharyngeal exudate.  Eyes: Conjunctivae are normal. Right eye exhibits no discharge. Left eye exhibits no discharge. No scleral icterus.  Neck: Normal range of motion. Neck supple.  Cardiovascular: Normal rate, regular rhythm, normal heart sounds and intact distal pulses.   Pulmonary/Chest: Effort normal and breath sounds normal. No respiratory distress. No wheezes. No rales.  Abdominal: Soft. Bowel sounds are normal. Exhibits no distension and no mass. There is no tenderness.  Musculoskeletal: Normal range of motion. Exhibits no edema.  Lymphadenopathy:    No cervical adenopathy.  Neurological: Alert and oriented to person, place, and time. Exhibits normal muscle tone. Gait normal. Coordination normal.  Skin: Skin is warm and dry. No rash noted. Not diaphoretic. No erythema. No pallor.  Psychiatric: Mood, memory and judgment normal.  Vitals reviewed.  LABORATORY DATA: Lab Results  Component Value Date   WBC 12.2 (H) 06/02/2020   HGB 10.4 (L) 06/02/2020   HCT 35.7 (L) 06/02/2020   MCV 74.1 (L) 06/02/2020   PLT 405 (H) 06/02/2020      Chemistry      Component Value Date/Time   NA 137 06/07/2020 1045   K 4.7 06/07/2020 1045   CL 102 06/07/2020 1045   CO2 20 06/07/2020 1045   BUN 24 06/07/2020 1045   CREATININE 1.43 (H) 06/07/2020 1045   CREATININE 1.49 (H) 08/13/2016 1556      Component Value Date/Time   CALCIUM 8.6 (L) 06/07/2020 1045   ALKPHOS 107 09/09/2008 1130   AST 29 09/09/2008 1130   ALT 36 (H) 09/09/2008 1130   BILITOT 0.7 09/09/2008 1130       RADIOGRAPHIC STUDIES: DG Chest Port  1 View  Result Date: 05/30/2020 CLINICAL DATA:  Recent cardioversion for atrial fibrillation with wheezing and shortness of breath. EXAM: PORTABLE CHEST 1 VIEW COMPARISON:  July 30, 2016 FINDINGS: Mild to moderate severity diffusely increased interstitial lung markings are seen. This is increased in severity when compared to the prior study. Mild areas of  atelectasis and/or infiltrate are also seen within the bilateral lung bases. There is no evidence of a pleural effusion or pneumothorax. The cardiac silhouette is moderately enlarged. The visualized skeletal structures are unremarkable. IMPRESSION: 1. Mild to moderate severity interstitial edema. 2. Mild bibasilar atelectasis and/or infiltrate. Electronically Signed   By: Virgina Norfolk M.D.   On: 05/30/2020 20:47   MYOCARDIAL PERFUSION IMAGING  Result Date: 05/25/2020  There was no ST segment deviation noted during stress.  The study is normal.  This is a low risk study.  Normal pharmacologic nuclear stress test with no evidence for prior infarct or ischemia. LVEF not calculated due to atrial fibrillation.  ECHOCARDIOGRAM COMPLETE  Result Date: 06/01/2020    ECHOCARDIOGRAM REPORT   Patient Name:   Leslie [name] Date of Exam: 06/01/2020 Medical Rec #:  [id number]       Height:       66.0 in Accession #:    [id number]      Weight:       267.4 lb Date of Birth:  Aug 20, 1945       BSA:          2.263 m Patient Age:    24 years        BP:           142/83 mmHg Patient Gender: F               HR:           74 bpm. Exam Location:  Inpatient Procedure: 2D Echo Indications:    CHF-Acute Diastolic V76.16  History:        Patient has prior history of Echocardiogram examinations, most                 recent 07/08/2019. Arrythmias:Atrial Fibrillation; Risk                 Factors:Hypertension, Diabetes and Dyslipidemia.  Sonographer:    Mikki Santee RDCS (AE) Referring Phys: [id number] [place]  1. Left ventricular ejection fraction, by  estimation, is 55 to 60%. The left ventricle has normal function. The left ventricle has no regional wall motion abnormalities. Left ventricular diastolic parameters are consistent with Grade II diastolic dysfunction (pseudonormalization).  2. Right ventricular systolic function is normal. The right ventricular size is normal. There is normal pulmonary artery systolic pressure.  3. Left atrial size was moderately dilated.  4. The mitral valve is normal in structure. Mild mitral valve regurgitation. No evidence of mitral stenosis.  5. The aortic valve is tricuspid. Aortic valve regurgitation is moderate. No aortic stenosis is present.  6. The inferior vena cava is normal in size with greater than 50% respiratory variability, suggesting right atrial pressure of 3 mmHg. Comparison(s): No significant change from prior study. FINDINGS  Left Ventricle: Left ventricular ejection fraction, by estimation, is 55 to 60%. The left ventricle has normal function. The left ventricle has no regional wall motion abnormalities. The left ventricular internal cavity size was normal in size. There is  no left ventricular hypertrophy. Left ventricular diastolic parameters are consistent with Grade II diastolic dysfunction (pseudonormalization). Right Ventricle: The right ventricular size is normal. Right vetricular wall thickness was not well visualized. Right ventricular systolic function is normal. There is normal pulmonary artery systolic pressure. The tricuspid regurgitant velocity is 1.89 m/s, and with an assumed right atrial pressure of 3 mmHg, the estimated right ventricular systolic pressure is 26.9 mmHg. Left Atrium: Left atrial size was moderately  dilated. Right Atrium: Right atrial size was normal in size. Pericardium: There is no evidence of pericardial effusion. Presence of pericardial fat pad. Mitral Valve: The mitral valve is normal in structure. Mild mitral valve regurgitation. No evidence of mitral valve stenosis.  Tricuspid Valve: The tricuspid valve is normal in structure. Tricuspid valve regurgitation is mild . No evidence of tricuspid stenosis. Aortic Valve: The aortic valve is tricuspid. Aortic valve regurgitation is moderate. Aortic regurgitation PHT measures 682 msec. No aortic stenosis is present. Pulmonic Valve: The pulmonic valve was not well visualized. Pulmonic valve regurgitation is trivial. No evidence of pulmonic stenosis. Aorta: The aortic root, ascending aorta and aortic arch are all structurally normal, with no evidence of dilitation or obstruction. Venous: The inferior vena cava is normal in size with greater than 50% respiratory variability, suggesting right atrial pressure of 3 mmHg. IAS/Shunts: The atrial septum is grossly normal.  LEFT VENTRICLE PLAX 2D LVIDd:         5.40 cm  Diastology LVIDs:         3.70 cm  LV e' medial:    5.55 cm/s LV PW:         0.90 cm  LV E/e' medial:  17.9 LV IVS:        0.90 cm  LV e' lateral:   7.18 cm/s LVOT diam:     1.90 cm  LV E/e' lateral: 13.8 LV SV:         79 LV SV Index:   35 LVOT Area:     2.84 cm  RIGHT VENTRICLE TAPSE (M-mode): 1.7 cm LEFT ATRIUM             Index       RIGHT ATRIUM           Index LA diam:        3.80 cm 1.68 cm/m  RA Area:     17.30 cm LA Vol (A2C):   77.3 ml 34.16 ml/m RA Volume:   37.10 ml  16.40 ml/m LA Vol (A4C):   56.1 ml 24.79 ml/m LA Biplane Vol: 67.6 ml 29.88 ml/m  AORTIC VALVE LVOT Vmax:   131.00 cm/s LVOT Vmean:  81.600 cm/s LVOT VTI:    0.277 m AI PHT:      682 msec  AORTA Ao Root diam: 2.90 cm MITRAL VALVE               TRICUSPID VALVE MV Area (PHT): 2.83 cm    TR Peak grad:   14.3 mmHg MV Decel Time: 268 msec    TR Vmax:        189.00 cm/s MV E velocity: 99.40 cm/s MV A velocity: 66.00 cm/s  SHUNTS MV E/A ratio:  1.51        Systemic VTI:  0.28 m                            Systemic Diam: 1.90 cm Buford Dresser MD Electronically signed by Buford Dresser MD Signature Date/Time: 06/01/2020/8:33:41 PM    Final      ASSESSMENT: This is a very pleasant 75 year old Caucasian female referred to the clinic for chronic leukocytosis. She also has iron deficiency anemia.     PLAN: The patient was seen with Dr. Julien Nordmann today.  The patient had several lab studies performed including a CBC, CMP, B12, ferritin, iron studies, SPEP, folate, and LDH.  The patient's CBC shows a WBC within  the upper end of normal limits at 10.3 with a normal proportion of lymphocytes and neutrophils.  The patient's platelet count is within normal limits at 339K.  The patient's hemoglobin is just below normal at 11.8 and her MCV is low at 75.3. Her hemoglobin is trending upwards, likely due to her recent iron infusion.  Her other lab studies are pending at this time.   Dr. Julien Nordmann feels that her intermittent leukocytosis is likely benign and reactive. Given her normal WBC today, he does not feel like she needs to undergo extensive testing/molecular testing at this time. However, if her WBC worsens and is concerning in the future, then we can revisit further testing.   Regarding her iron deficiency, discussed that we would be happy to arrange for IV iron infusions in the future when needed since she is unable to tolerate oral iron supplements. She has ongoing issues with significant constipation that is affecting her quality of life. She also has some rectal bleeding with straining/constipation for which she is following up with her gastroenterologist in the near future. She was encouraged to at least take a multivitamin with iron and increase her intake of iron rich food. She declined a handout of iron rich food.   We discussed we can monitor her iron studies in 3 months; however, she states she follows with her PCP frequently and has an appointment around that time. She would ike to follow with her PCP for monitoring of her iron deficiency and anemia. She was advised to call us if her iron studies worsen and if her PCP feels she would benefit  from IV iron.   I have forwarded her CMP to her cardiologist per patient request. She states they are monitoring her creatinine. I will also fax a note to her PCP regarding the patient's request for them to monitor her iron studies.    The patient voices understanding of current disease status and treatment options and is in agreement with the current care plan.  All questions were answered. The patient knows to call the clinic with any problems, questions or concerns. We can certainly see the patient much sooner if necessary.  Thank you so much for allowing me to participate in the care of [facility]. I will continue to follow up the patient with you and assist in her care.  I spent 40-54 minutes in this encounter.   Disclaimer: This note was dictated with voice recognition software. Similar sounding words can inadvertently be transcribed and may not be corrected upon review.   Lucille Crichlow L Treyvin Glidden June 20, 2020, 9:47 AM  ADDENDUM: Hematology/Oncology Attending: I had a face-to-face encounter with the patient today.  I reviewed her record, lab and recommended her care plan.  This is a very pleasant 75 years old white female with multiple medical problems including hypertension, atrial fibrillation and currently on anticoagulation, Barrett's esophagus, status post lap band surgery, asthma, allergic rhinitis, obstructive sleep apnea, congestive heart failure, dyslipidemia, eczema, hypothyroidism and chronic kidney disease. The patient was seen by her primary care physician after a recent hospitalization and was noted to have persistent elevation of her total white blood count.  She had intermittent leukocytosis for several years.  She was also found to have anemia and the patient started on oral iron tablet but she could not tolerate her treatment.  She received iron infusion on June 03, 2020.  She was referred to [place] today for evaluation and recommendation regarding her  leukocytosis and also anemia.  She is scheduled to see Dr. Cristina Gong from gastroenterology for the palliative esophagus and also her persistent iron deficiency anemia. We order several studies today on this patient and her CBC showed normal white blood count of 10.3.  She continues to have persistent anemia with hemoglobin of 11.8 and hematocrit 38.8% with MCV of 75.3.  The patient has normal platelets count.  Vitamin B12 and serum folate were normal.  Ferritin level was normal and the patient had normal serum iron but low iron saturation.  She has persistent mild renal insufficiency. I assured the patient regarding the leukocytosis but definitely she will need further evaluation and help with her iron deficiency anemia.  She is scheduled to see gastroenterology and I encouraged her to keep her appointment with Dr. Cristina Gong. I will be happy to arrange for the patient iron infusion on as-needed basis but currently she is doing fine. We recommended for the patient to increase her iron rich diet.  We also gave her the option of follow-up visit with [place] in 3 months with repeat CBC and iron study but the patient mentions that she sees her primary care physician and gastroenterology at regular basis and she will call us if she will need any iron infusion in the future. The patient was encouraged to call immediately if she has any other concerning symptoms in the interval. The total time spent in the appointment was 60 minutes. Disclaimer: This note was dictated with voice recognition software. Similar sounding words can inadvertently be transcribed and may be missed upon review. Eilleen Kempf, MD 06/21/20

## 2020-06-21 ENCOUNTER — Inpatient Hospital Stay: Payer: PPO | Attending: Physician Assistant | Admitting: Physician Assistant

## 2020-06-21 ENCOUNTER — Other Ambulatory Visit: Payer: Self-pay

## 2020-06-21 ENCOUNTER — Inpatient Hospital Stay: Payer: PPO

## 2020-06-21 ENCOUNTER — Telehealth: Payer: Self-pay | Admitting: Physician Assistant

## 2020-06-21 ENCOUNTER — Other Ambulatory Visit: Payer: Self-pay | Admitting: Physician Assistant

## 2020-06-21 DIAGNOSIS — D72829 Elevated white blood cell count, unspecified: Secondary | ICD-10-CM

## 2020-06-21 DIAGNOSIS — N189 Chronic kidney disease, unspecified: Secondary | ICD-10-CM | POA: Insufficient documentation

## 2020-06-21 DIAGNOSIS — D649 Anemia, unspecified: Secondary | ICD-10-CM

## 2020-06-21 DIAGNOSIS — I130 Hypertensive heart and chronic kidney disease with heart failure and stage 1 through stage 4 chronic kidney disease, or unspecified chronic kidney disease: Secondary | ICD-10-CM | POA: Insufficient documentation

## 2020-06-21 DIAGNOSIS — D509 Iron deficiency anemia, unspecified: Secondary | ICD-10-CM | POA: Insufficient documentation

## 2020-06-21 DIAGNOSIS — E039 Hypothyroidism, unspecified: Secondary | ICD-10-CM | POA: Insufficient documentation

## 2020-06-21 LAB — CBC WITH DIFFERENTIAL (CANCER CENTER ONLY)
Abs Immature Granulocytes: 0.03 10*3/uL (ref 0.00–0.07)
Basophils Absolute: 0.1 10*3/uL (ref 0.0–0.1)
Basophils Relative: 1 %
Eosinophils Absolute: 0.2 10*3/uL (ref 0.0–0.5)
Eosinophils Relative: 2 %
HCT: 38.8 % (ref 36.0–46.0)
Hemoglobin: 11.8 g/dL — ABNORMAL LOW (ref 12.0–15.0)
Immature Granulocytes: 0 %
Lymphocytes Relative: 20 %
Lymphs Abs: 2.1 10*3/uL (ref 0.7–4.0)
MCH: 22.9 pg — ABNORMAL LOW (ref 26.0–34.0)
MCHC: 30.4 g/dL (ref 30.0–36.0)
MCV: 75.3 fL — ABNORMAL LOW (ref 80.0–100.0)
Monocytes Absolute: 0.6 10*3/uL (ref 0.1–1.0)
Monocytes Relative: 6 %
Neutro Abs: 7.4 10*3/uL (ref 1.7–7.7)
Neutrophils Relative %: 72 %
Platelet Count: 339 10*3/uL (ref 150–400)
RBC: 5.15 MIL/uL — ABNORMAL HIGH (ref 3.87–5.11)
RDW: 19.6 % — ABNORMAL HIGH (ref 11.5–15.5)
WBC Count: 10.3 10*3/uL (ref 4.0–10.5)
nRBC: 0 /100 WBC

## 2020-06-21 LAB — CMP (CANCER CENTER ONLY)
ALT: 14 U/L (ref 0–44)
AST: 15 U/L (ref 15–41)
Albumin: 3.9 g/dL (ref 3.5–5.0)
Alkaline Phosphatase: 127 U/L — ABNORMAL HIGH (ref 38–126)
Anion gap: 11 (ref 5–15)
BUN: 17 mg/dL (ref 8–23)
CO2: 25 mmol/L (ref 22–32)
Calcium: 8.8 mg/dL — ABNORMAL LOW (ref 8.9–10.3)
Chloride: 104 mmol/L (ref 98–111)
Creatinine: 1.21 mg/dL — ABNORMAL HIGH (ref 0.44–1.00)
GFR, Estimated: 47 mL/min — ABNORMAL LOW (ref >60)
Glucose, Bld: 98 mg/dL (ref 70–99)
Potassium: 4.4 mmol/L (ref 3.5–5.1)
Sodium: 140 mmol/L (ref 135–145)
Total Bilirubin: 0.6 mg/dL (ref 0.3–1.2)
Total Protein: 7.6 g/dL (ref 6.5–8.1)

## 2020-06-21 LAB — IRON AND TIBC
Iron: 41 ug/dL (ref 41–142)
Saturation Ratios: 10 % — ABNORMAL LOW (ref 21–57)
TIBC: 411 ug/dL (ref 236–444)
UIBC: 370 ug/dL (ref 120–384)

## 2020-06-21 LAB — VITAMIN B12: Vitamin B-12: 633 pg/mL (ref 180–914)

## 2020-06-21 LAB — FERRITIN: Ferritin: 36 ng/mL (ref 11–307)

## 2020-06-21 LAB — FOLATE: Folate: 14.5 ng/mL (ref >5.9)

## 2020-06-21 LAB — LACTATE DEHYDROGENASE: LDH: 138 U/L (ref 98–192)

## 2020-06-21 NOTE — Telephone Encounter (Signed)
Added lab appointment for today per 3/9 schedule message. Patient is aware.

## 2020-06-22 ENCOUNTER — Telehealth: Payer: Self-pay | Admitting: Physician Assistant

## 2020-06-22 NOTE — Telephone Encounter (Signed)
I called the patient to review the rest of her iron studies. Her values are moving in the right direction but are on the low end of normal. I gave the patient the option of having [place] arrange for 1 more iron infusion vs continuing on observation and having her PCP recheck her iron at her next appointment on 08/15/20. The patient would like to hold off an an infusion for now and try lifestyle modifications to increase her dietary of iron for now and recheck at her PCPs appointment. If her number continues to be low, she will call us back to arrange for IV iron.

## 2020-06-23 LAB — PROTEIN ELECTROPHORESIS, SERUM, WITH REFLEX
A/G Ratio: 1.2 (ref 0.7–1.7)
Albumin ELP: 3.8 g/dL (ref 2.9–4.4)
Alpha-1-Globulin: 0.3 g/dL (ref 0.0–0.4)
Alpha-2-Globulin: 1 g/dL (ref 0.4–1.0)
Beta Globulin: 1.4 g/dL — ABNORMAL HIGH (ref 0.7–1.3)
Gamma Globulin: 0.6 g/dL (ref 0.4–1.8)
Globulin, Total: 3.3 g/dL (ref 2.2–3.9)
Total Protein ELP: 7.1 g/dL (ref 6.0–8.5)

## 2020-07-13 DIAGNOSIS — K5901 Slow transit constipation: Secondary | ICD-10-CM | POA: Diagnosis not present

## 2020-07-13 DIAGNOSIS — M6281 Muscle weakness (generalized): Secondary | ICD-10-CM | POA: Diagnosis not present

## 2020-07-13 DIAGNOSIS — R151 Fecal smearing: Secondary | ICD-10-CM | POA: Diagnosis not present

## 2020-07-26 DIAGNOSIS — K5901 Slow transit constipation: Secondary | ICD-10-CM | POA: Diagnosis not present

## 2020-07-26 DIAGNOSIS — R151 Fecal smearing: Secondary | ICD-10-CM | POA: Diagnosis not present

## 2020-07-26 DIAGNOSIS — M6281 Muscle weakness (generalized): Secondary | ICD-10-CM | POA: Diagnosis not present

## 2020-07-27 ENCOUNTER — Telehealth: Payer: Self-pay | Admitting: Cardiovascular Disease

## 2020-07-27 MED ORDER — ELIQUIS 5 MG PO TABS: ORAL_TABLET | ORAL | 1 refills | Status: DC

## 2020-07-27 NOTE — Telephone Encounter (Signed)
42f, 121.6kg, scr 1.21 06/21/20. Lovw/carroll 06/14/20

## 2020-07-27 NOTE — Telephone Encounter (Signed)
*  STAT* If patient is at the pharmacy, call can be transferred to refill team.   1. Which medications need to be refilled? (please list name of each medication and dose if known) ELIQUIS 5 MG TABS tablet  2. Which pharmacy/location (including street and city if local pharmacy) is medication to be sent to? [facility] [place], [place] AT [address]  3. Do they need a 30 day or 90 day supply? 90   PT is calling to get a new 90 day supply prescription.According to the pharmacy pt only has 40 tablets left with the current script. She wants 90 day because its cheaper than doing 30days.

## 2020-08-01 ENCOUNTER — Encounter (HOSPITAL_COMMUNITY): Payer: Self-pay | Admitting: Cardiology

## 2020-08-04 ENCOUNTER — Encounter (HOSPITAL_COMMUNITY): Payer: Self-pay | Admitting: Cardiology

## 2020-08-15 DIAGNOSIS — R198 Other specified symptoms and signs involving the digestive system and abdomen: Secondary | ICD-10-CM | POA: Diagnosis not present

## 2020-08-21 DIAGNOSIS — E7800 Pure hypercholesterolemia, unspecified: Secondary | ICD-10-CM | POA: Diagnosis not present

## 2020-08-21 DIAGNOSIS — R7303 Prediabetes: Secondary | ICD-10-CM | POA: Diagnosis not present

## 2020-08-21 DIAGNOSIS — J45909 Unspecified asthma, uncomplicated: Secondary | ICD-10-CM | POA: Diagnosis not present

## 2020-08-21 DIAGNOSIS — N183 Chronic kidney disease, stage 3 unspecified: Secondary | ICD-10-CM | POA: Diagnosis not present

## 2020-08-21 DIAGNOSIS — E039 Hypothyroidism, unspecified: Secondary | ICD-10-CM | POA: Diagnosis not present

## 2020-08-21 DIAGNOSIS — D508 Other iron deficiency anemias: Secondary | ICD-10-CM | POA: Diagnosis not present

## 2020-08-21 DIAGNOSIS — R945 Abnormal results of liver function studies: Secondary | ICD-10-CM | POA: Diagnosis not present

## 2020-08-21 DIAGNOSIS — Z0000 Encounter for general adult medical examination without abnormal findings: Secondary | ICD-10-CM | POA: Diagnosis not present

## 2020-08-21 DIAGNOSIS — G4733 Obstructive sleep apnea (adult) (pediatric): Secondary | ICD-10-CM | POA: Diagnosis not present

## 2020-08-21 DIAGNOSIS — I10 Essential (primary) hypertension: Secondary | ICD-10-CM | POA: Diagnosis not present

## 2020-08-21 DIAGNOSIS — N816 Rectocele: Secondary | ICD-10-CM | POA: Diagnosis not present

## 2020-08-21 DIAGNOSIS — J301 Allergic rhinitis due to pollen: Secondary | ICD-10-CM | POA: Diagnosis not present

## 2020-08-21 DIAGNOSIS — R7989 Other specified abnormal findings of blood chemistry: Secondary | ICD-10-CM | POA: Diagnosis not present

## 2020-08-23 ENCOUNTER — Telehealth: Payer: Self-pay | Admitting: Physician Assistant

## 2020-08-23 NOTE — Telephone Encounter (Signed)
I called and spoke to the patient today. She was seen in the clinic on 06/21/20. At that time, she was given the option of continuing with dietary modifications with iron rich food or proceeding with an iron infusion since her iron studies were improving. She opted with lifestyle/diatery modifications and then to recheck with her PCP at her next follow up in May 2022. I received a fax today from her PCP and her follow up iron studies. Her iron is still low at 39, her transferrin is normal at 347, and her iron saturation is low at 8%. Her TIBC is elevated at 486. I called the patient and let her know I would recommend IV iron infusions since her dietary modifications are not sufficient. She also cannot tolerate oral iron supplements. She is in agreement with this. I will schedule a follow up to discuss this in more detail and an iron infusion around 09/06/20.

## 2020-08-25 ENCOUNTER — Telehealth: Payer: Self-pay | Admitting: Internal Medicine

## 2020-08-25 NOTE — Telephone Encounter (Signed)
Scheduled appts per 5/11 sch msg. Pt aware.  

## 2020-08-30 ENCOUNTER — Other Ambulatory Visit: Payer: Self-pay | Admitting: Physician Assistant

## 2020-09-03 NOTE — Progress Notes (Signed)
[facility] OFFICE PROGRESS NOTE  Glenis Smoker, MD [place] [address]  DIAGNOSIS: Iron Deficiency Anemia   PRIOR THERAPY: None  CURRENT THERAPY:  1) IV Iron PRN due to intolerance of oral iron supplements. Last given on 06/03/20  INTERVAL HISTORY: Leslie [name] 75 y.o. female returns to the clinic today for a follow-up visit.  The patient was first referred to the clinic on 06/21/2020 for anemia and leukocytosis.  Regarding her anemia, the patient is intolerant to oral iron supplements due to exacerbating her already challenging time with constipation and straining and bright red blood. She sees Dr. Cristina Gong from gastroenterology regarding her Barrett's esophagitis and she had a follow-up appointment with him in the interval. He suggested she see on OBGYN to ensure no prolapse contributing to her constipation. At her last appointment, the patient had some improvement in her iron studies and was offered IV iron infusions versus increasing her iron in her diet and starting a multivitamin.  The patient decided to start with conservative measures and repeated her iron studies through her PCP in early Sep 02, 2020 which noted persistent iron deficiency.  Therefore, the patient is here today for further evaluation and repeat lab work before proceeding with an IV iron infusion today.  Overall, the patient denies any new concerning complaint. She still struggles with constipation and rectal bleeding with firm bowel movements. She also is on a blood thinner due to her atrial fibrillation. She reports her usual occasional shortness of breath and palpitations due to her atrial fibrillation. She saw her cardiologist yesterday and was noted to be in sinus rhythm.  She denies any abnormal bleeding or bruising to her knowledge except for the rectal bleeding.  Of note, the patient also had a lap band procedure in 2000 and time.  The patient was also initially referred to  the clinic for leukocytosis which had improved to be within the upper normal limits of normal at her last appointment and it was thought that this is likely benign and reactive, however at this worsened in the future then we could revisit further testing. She drove herself to her appointment today. She also took Human resources officer today for her allergies. She is here for repeat blood work and to start her first iron infusion.   MEDICAL HISTORY: Past Medical History:  Diagnosis Date  . Adenomatous polyp   . Arthritis    "knees, legs, fingers" (07/30/2016)  . Asthma   . Bursitis of left shoulder    "just finished PT" (07/30/2016)  . Chest pain    a. 2003 Abnl stress test-->Cath: nonobs CAD.  Marland Kitchen Chronic bronchitis ([place])   . Chronic kidney disease    stage 3  . GERD (gastroesophageal reflux disease)    barrett's esophagus- Dr. Cristina Gong  . History of hiatal hernia   . Hyperlipidemia   . Hypertension   . Hypothyroidism   . Migraines    "sporatic; at least a few/year" (07/30/2016)  . Mitral regurgitation    a. 07/2016 Echo: mild to mod MR;  b. 07/2016 TEE: mild MR.  . Moderate aortic insufficiency    a. 07/2016 Echo: EF 55-60%, mod AI;  b. 07/2016 TEE: EF 50-55%, mod AI.  Marland Kitchen Obesity    s/p lap band surgery 6/10  . OSA on CPAP   . PAF (paroxysmal atrial fibrillation) ([place])    a. 07/2016 TEE/DCCV: EF 50-55%, mild MR, mod AI, mild to mod TR, neg bubble study-->successful DCCV x1 w/  120J.  Marland Kitchen PONV (postoperative nausea and vomiting)   . Vitamin D deficiency     ALLERGIES:  is allergic to clindamycin hcl, augmentin [amoxicillin-pot clavulanate], codeine, demerol [meperidine], and sulfa antibiotics.  MEDICATIONS:  Current Outpatient Medications  Medication Sig Dispense Refill  . acetaminophen (TYLENOL) 650 MG CR tablet Take 1,300 mg by mouth every 8 (eight) hours as needed for pain.    Marland Kitchen apixaban (ELIQUIS) 5 MG TABS tablet TAKE 1 TABLET(5 MG) BY MOUTH TWICE DAILY 180 tablet 1  . atorvastatin (LIPITOR) 10 MG  tablet Take 10 mg by mouth daily.    . Bacillus Coagulans-Inulin (ALIGN PREBIOTIC-PROBIOTIC PO) Take 2 capsules by mouth daily. Gummy    . cholecalciferol (VITAMIN D) 1000 UNITS tablet Take 1,000 Units by mouth daily.    . diclofenac Sodium (VOLTAREN) 1 % GEL Apply 1 application topically 2 (two) times daily.    . ferrous sulfate 325 (65 FE) MG tablet TAKE 1 TABLET (325 MG TOTAL) BY MOUTH 2 (TWO) TIMES DAILY WITH A MEAL. 60 tablet 0  . fexofenadine (ALLEGRA) 180 MG tablet Take 180 mg by mouth daily.    . flecainide (TAMBOCOR) 50 MG tablet Take 1 tablet (50 mg total) by mouth 2 (two) times daily. 60 tablet 3  . fluticasone (FLONASE) 50 MCG/ACT nasal spray Place 2 sprays into both nostrils at bedtime.     . fluticasone (FLOVENT HFA) 44 MCG/ACT inhaler Inhale 2 puffs into the lungs 2 (two) times daily. 1 Inhaler 0  . furosemide (LASIX) 20 MG tablet Take 1 tablet (20 mg total) by mouth daily. 90 tablet 1  . Glucosamine-Chondroitin (OSTEO BI-FLEX REGULAR STRENGTH PO) Take 1 tablet by mouth daily.    Marland Kitchen levothyroxine (SYNTHROID, LEVOTHROID) 50 MCG tablet Take 50 mcg by mouth daily before breakfast.    . MAGNESIUM-OXIDE 400 (241.3 Mg) MG tablet TAKE 1 TABLET BY MOUTH DAILY 90 tablet 3  . metoprolol succinate (TOPROL-XL) 100 MG 24 hr tablet Take 100 mg (one tablet) in the morning and 50 mg (half a tablet) in the evening. 135 tablet 3  . metroNIDAZOLE (METROCREAM) 0.75 % cream Apply 1 application topically daily.    . mometasone (ELOCON) 0.1 % cream Apply 1 application topically 2 (two) times daily as needed (skin irritation).    . Multiple Vitamins-Minerals (CENTRUM MULTIGUMMIES) CHEW Chew 1 tablet by mouth daily.    Marland Kitchen omeprazole (PRILOSEC) 20 MG capsule Take 40 mg by mouth daily before breakfast.    . Psyllium (METAMUCIL) 28.3 % POWD Take 1 Scoop by mouth daily.    Marland Kitchen senna (SENOKOT) 8.6 MG tablet Take 1 tablet by mouth every evening.    . verapamil (CALAN-SR) 180 MG CR tablet Take 180 mg by mouth 2 (two)  times daily.     No current facility-administered medications for this visit.    SURGICAL HISTORY:  Past Surgical History:  Procedure Laterality Date  . APPENDECTOMY     incidental  . CARDIOVERSION N/A 08/01/2016   Procedure: CARDIOVERSION;  Surgeon: Jerline Pain, MD;  Location: [facility];  Service: Cardiovascular;  Laterality: N/A;  . CARDIOVERSION N/A 06/24/2019   Procedure: CARDIOVERSION;  Surgeon: Sanda Klein, MD;  Location: [facility];  Service: Cardiovascular;  Laterality: N/A;  . CARDIOVERSION N/A 05/30/2020   Procedure: CARDIOVERSION;  Surgeon: Sanda Klein, MD;  Location: [facility];  Service: Cardiovascular;  Laterality: N/A;  . CARDIOVERSION N/A 05/09/2020   Procedure: CARDIOVERSION;  Surgeon: [name], MD;  Location: [facility];  Service: Cardiovascular;  Laterality: N/A;  . CATARACT EXTRACTION W/ INTRAOCULAR LENS  IMPLANT, BILATERAL Bilateral ~ 2008-2017   left - right  . COMBINED HYSTERECTOMY VAGINAL / OOPHORECTOMY / A&P REPAIR  1981  . HERNIA REPAIR    . INGUINAL HERNIA REPAIR Left 1952  . LAPAROSCOPIC GASTRIC BANDING WITH HIATAL HERNIA REPAIR  09/2008  . TEE WITHOUT CARDIOVERSION N/A 08/01/2016   Procedure: TRANSESOPHAGEAL ECHOCARDIOGRAM (TEE);  Surgeon: Jerline Pain, MD;  Location: [facility];  Service: Cardiovascular;  Laterality: N/A;  . [place]  . VAGINAL HYSTERECTOMY  1981   "w/1 ovary"    REVIEW OF SYSTEMS:   Review of Systems  Constitutional: Positive for occasional fatigue. Negative for appetite change, chills,  fever and unexpected weight change.  HENT:  Negative for mouth sores, nosebleeds, sore throat and trouble swallowing.   Eyes: Negative for eye problems and icterus.  Respiratory: Positive for occasional shortness of breath/palpiations (none at this time). Negative for cough, hemoptysis, and wheezing.   Cardiovascular: Negative for chest pain and leg swelling.  Gastrointestinal: Negative  for abdominal pain, constipation, diarrhea, nausea and vomiting.  Genitourinary: Negative for bladder incontinence, difficulty urinating, dysuria, frequency and hematuria.   Musculoskeletal: Negative for back pain, gait problem, neck pain and neck stiffness.  Skin: Negative for itching and rash.  Neurological: Negative for dizziness, extremity weakness, gait problem, headaches, light-headedness and seizures.  Hematological: Negative for adenopathy. Does not bruise/bleed easily.  Psychiatric/Behavioral: Negative for confusion, depression and sleep disturbance. The patient is not nervous/anxious.     PHYSICAL EXAMINATION:  Blood pressure 132/79, pulse 71, temperature 97.7 F (36.5 C), temperature source Tympanic, resp. rate 19, height 5\' 6"  (1.676 m), weight 270 lb 12.8 oz (122.8 kg), SpO2 99 %.  ECOG PERFORMANCE STATUS: 1 - Symptomatic but completely ambulatory  Physical Exam  Constitutional: Oriented to person, place, and time and well-developed, well-nourished, and in no distress.  HENT:  Head: Normocephalic and atraumatic.  Mouth/Throat: Oropharynx is clear and moist. No oropharyngeal exudate.  Eyes: Conjunctivae are normal. Right eye exhibits no discharge. Left eye exhibits no discharge. No scleral icterus.  Neck: Normal range of motion. Neck supple.  Cardiovascular: Normal rate, regular rhythm, normal heart sounds and intact distal pulses.   Pulmonary/Chest: Effort normal and breath sounds normal. No respiratory distress. No wheezes. No rales.  Abdominal: Soft. Bowel sounds are normal. Exhibits no distension and no mass. There is no tenderness.  Musculoskeletal: Normal range of motion. Exhibits no edema.  Lymphadenopathy:    No cervical adenopathy.  Neurological: Alert and oriented to person, place, and time. Exhibits normal muscle tone. Gait normal. Coordination normal.  Skin: Skin is warm and dry. No rash noted. Not diaphoretic. No erythema. No pallor.  Psychiatric: Mood, memory  and judgment normal.  Vitals reviewed.  LABORATORY DATA: Lab Results  Component Value Date   WBC 8.0 09/06/2020   HGB 11.5 (L) 09/06/2020   HCT 37.0 09/06/2020   MCV 76.4 (L) 09/06/2020   PLT 319 09/06/2020      Chemistry      Component Value Date/Time   NA 141 09/06/2020 0754   NA 137 06/07/2020 1045   K 4.0 09/06/2020 0754   CL 106 09/06/2020 0754   CO2 22 09/06/2020 0754   BUN 23 09/06/2020 0754   BUN 24 06/07/2020 1045   CREATININE 1.16 (H) 09/06/2020 0754   CREATININE 1.49 (H) 08/13/2016 1556      Component Value Date/Time   CALCIUM 8.9 09/06/2020 0754  ALKPHOS 115 09/06/2020 0754   AST 12 (L) 09/06/2020 0754   ALT 10 09/06/2020 0754   BILITOT 0.4 09/06/2020 0754       RADIOGRAPHIC STUDIES:  No results found.   ASSESSMENT/PLAN:  This is a very pleasant 75 year old Caucasian female referred to the clinic for iron deficiency anemia likely due to rectal bleeding from constipation. She is also on anti-coagulation due to atrial fibrillation.   The patient had repeat labs including a CBC which showed a hemoglobin of 11.5.  She had iron studies performed by her PCP recently which showed persistent iron deficiency. She had new baseline labs performed today which shows a low iron at 30 and low saturation at 7%.   Therefore, we will arrange for the patient to have 3 weekly iron infusions with Venofer 300 mg x 3.  She will receive her first dose today.  We will see her back for follow-up visit in 2-3 months for evaluation and to repeat her CBC, iron studies, and ferritin.  She was instructed can to continue to increase her iron in her diet.  She will continue to follow with Dr. Cristina Gong regarding her rectal bleeding with constipation.  The patient was advised to call immediately if she has any concerning symptoms in the interval. The patient voices understanding of current disease status and treatment options and is in agreement with the current care plan. All  questions were answered. The patient knows to call the clinic with any problems, questions or concerns. We can certainly see the patient much sooner if necessary      Orders Placed This Encounter  Procedures  . Ferritin    Standing Status:   Future    Standing Expiration Date:   09/06/2021  . Iron and TIBC    Standing Status:   Future    Standing Expiration Date:   09/06/2021  . CBC with Differential (Cancer Center Only)    Standing Status:   Future    Standing Expiration Date:   09/06/2021     I spent 20-29 minutes in this encounter.   Kealan Buchan L Mishaal [place], PA-C 09/06/20

## 2020-09-04 ENCOUNTER — Telehealth: Payer: Self-pay | Admitting: Medical Oncology

## 2020-09-04 DIAGNOSIS — D649 Anemia, unspecified: Secondary | ICD-10-CM

## 2020-09-04 NOTE — Telephone Encounter (Signed)
  Does she need repeat labs? Per Julien Nordmann I told her yes.

## 2020-09-05 ENCOUNTER — Ambulatory Visit: Payer: PPO | Admitting: Cardiovascular Disease

## 2020-09-05 ENCOUNTER — Encounter: Payer: Self-pay | Admitting: Cardiovascular Disease

## 2020-09-05 ENCOUNTER — Other Ambulatory Visit: Payer: Self-pay

## 2020-09-05 VITALS — BP 145/76 | HR 65 | Ht 66.0 in | Wt 270.4 lb

## 2020-09-05 DIAGNOSIS — I5189 Other ill-defined heart diseases: Secondary | ICD-10-CM

## 2020-09-05 DIAGNOSIS — I340 Nonrheumatic mitral (valve) insufficiency: Secondary | ICD-10-CM

## 2020-09-05 DIAGNOSIS — Z7901 Long term (current) use of anticoagulants: Secondary | ICD-10-CM | POA: Diagnosis not present

## 2020-09-05 DIAGNOSIS — I10 Essential (primary) hypertension: Secondary | ICD-10-CM | POA: Diagnosis not present

## 2020-09-05 DIAGNOSIS — I480 Paroxysmal atrial fibrillation: Secondary | ICD-10-CM

## 2020-09-05 DIAGNOSIS — G4733 Obstructive sleep apnea (adult) (pediatric): Secondary | ICD-10-CM

## 2020-09-05 DIAGNOSIS — I351 Nonrheumatic aortic (valve) insufficiency: Secondary | ICD-10-CM

## 2020-09-05 DIAGNOSIS — G43109 Migraine with aura, not intractable, without status migrainosus: Secondary | ICD-10-CM

## 2020-09-05 MED ORDER — FUROSEMIDE 20 MG PO TABS: 20.0000 mg | ORAL_TABLET | Freq: Every day | ORAL | 1 refills | Status: DC

## 2020-09-05 NOTE — Progress Notes (Signed)
Cardiology Office Note:    Date:  09/13/2020   ID:  ENGLAND GREB, DOB [birth year], MRN [id number]  PCP:  Glenis Smoker, MD  Cardiologist:  Sanda Klein, MD    Referring MD: Glenis Smoker, *   Chief Complaint  Patient presents with  . Atrial Fibrillation    History of Present Illness:    Leslie [name] is a 76 y.o. female with a hx of Persistent atrial fibrillation and diastolic heart failure returning for follow-up.   Initially presented with persistent atrial fibrillation in April 2018, had a difficult time with rate control and underwent TEE guided cardioversion.  Beta-blocker dose was decreased in May 2018 when she had junctional rhythm.  After she received her second dose of coronavirus vaccine in February 2021 she developed symptomatic persistent atrial fibrillation with RVR, again challenging to rate control and underwent cardioversion on June 24, 2019.   She had to undergo cardioversion twice in 2022.initial cardioversion on January 25 was followed by recurrence of atrial fibrillation and flecainide was started.  A nuclear stress test on February 11 did not show any evidence of ischemia.  She underwent repeat cardioversion on February 15, but unfortunately subsequently developed pulmonary edema and required hospitalization for a few days.  Echocardiogram on February 17 showed LVEF 55-60% with "pseudo normal" mitral inflow and no major valvular abnormalities.  She has not had symptoms of heart failure or palpitations since that second cardioversion and is maintaining normal sinus rhythm on flecainide, as well as taking metoprolol and verapamil for rate control.   When her dose of metoprolol was decreased to 50 mg twice daily, her frequency of migraines (aura without headache) increased from twice a year to 4 or 5 times a week.  She has iron deficiency anemia and is receiving iron infusions.  She was intolerant of oral iron due to severe constipation.  She  reports compliance with CPAP and denies daytime hypersomnolence.  Recent TSH was normal at 3.06 in early May.  She has normal renal function with a creatinine of 1.13.  Additional problems include morbid obesity, obstructive sleep apnea on CPAP and treated hypertension. The most recent echocardiogram in February 2022 shows mild mitral regurgitation and moderate aortic regurgitation, unchanged from the previous one performed in March 2021.  The left atrium is moderately dilated.  LVEF is normal.  "Pseudonormal" mitral inflow was described, but this was performed relatively soon after her cardioversion.   Past Medical History:  Diagnosis Date  . Adenomatous polyp   . Arthritis    "knees, legs, fingers" (07/30/2016)  . Asthma   . Bursitis of left shoulder    "just finished PT" (07/30/2016)  . Chest pain    a. 2003 Abnl stress test-->Cath: nonobs CAD.  Marland Kitchen Chronic bronchitis ([place])   . Chronic kidney disease    stage 3  . GERD (gastroesophageal reflux disease)    barrett's esophagus- Dr. Cristina Gong  . History of hiatal hernia   . Hyperlipidemia   . Hypertension   . Hypothyroidism   . Migraines    "sporatic; at least a few/year" (07/30/2016)  . Mitral regurgitation    a. 07/2016 Echo: mild to mod MR;  b. 07/2016 TEE: mild MR.  . Moderate aortic insufficiency    a. 07/2016 Echo: EF 55-60%, mod AI;  b. 07/2016 TEE: EF 50-55%, mod AI.  Marland Kitchen Obesity    s/p lap band surgery 6/10  . OSA on CPAP   . PAF (paroxysmal atrial fibrillation) ([place])  a. 07/2016 TEE/DCCV: EF 50-55%, mild MR, mod AI, mild to mod TR, neg bubble study-->successful DCCV x1 w/ 120J.  Marland Kitchen PONV (postoperative nausea and vomiting)   . Vitamin D deficiency     Past Surgical History:  Procedure Laterality Date  . APPENDECTOMY     incidental  . CARDIOVERSION N/A 08/01/2016   Procedure: CARDIOVERSION;  Surgeon: Jerline Pain, MD;  Location: [facility];  Service: Cardiovascular;  Laterality: N/A;  . CARDIOVERSION N/A 06/24/2019    Procedure: CARDIOVERSION;  Surgeon: Sanda Klein, MD;  Location: [facility];  Service: Cardiovascular;  Laterality: N/A;  . CARDIOVERSION N/A 05/30/2020   Procedure: CARDIOVERSION;  Surgeon: Sanda Klein, MD;  Location: [facility];  Service: Cardiovascular;  Laterality: N/A;  . CARDIOVERSION N/A 05/09/2020   Procedure: CARDIOVERSION;  Surgeon: [name], MD;  Location: [facility];  Service: Cardiovascular;  Laterality: N/A;  . CATARACT EXTRACTION W/ INTRAOCULAR LENS  IMPLANT, BILATERAL Bilateral ~ 2008-2017   left - right  . COMBINED HYSTERECTOMY VAGINAL / OOPHORECTOMY / A&P REPAIR  1981  . HERNIA REPAIR    . INGUINAL HERNIA REPAIR Left 1952  . LAPAROSCOPIC GASTRIC BANDING WITH HIATAL HERNIA REPAIR  09/2008  . TEE WITHOUT CARDIOVERSION N/A 08/01/2016   Procedure: TRANSESOPHAGEAL ECHOCARDIOGRAM (TEE);  Surgeon: Jerline Pain, MD;  Location: [facility];  Service: Cardiovascular;  Laterality: N/A;  . [place]  . VAGINAL HYSTERECTOMY  1981   "w/1 ovary"    Current Medications: Current Meds  Medication Sig  . acetaminophen (TYLENOL) 650 MG CR tablet Take 1,300 mg by mouth every 8 (eight) hours as needed for pain.  Marland Kitchen apixaban (ELIQUIS) 5 MG TABS tablet TAKE 1 TABLET(5 MG) BY MOUTH TWICE DAILY  . atorvastatin (LIPITOR) 10 MG tablet Take 10 mg by mouth daily.  . Bacillus Coagulans-Inulin (ALIGN PREBIOTIC-PROBIOTIC PO) Take 2 capsules by mouth daily. Gummy  . cholecalciferol (VITAMIN D) 1000 UNITS tablet Take 1,000 Units by mouth daily.  . diclofenac Sodium (VOLTAREN) 1 % GEL Apply 1 application topically 2 (two) times daily.  . ferrous sulfate 325 (65 FE) MG tablet TAKE 1 TABLET (325 MG TOTAL) BY MOUTH 2 (TWO) TIMES DAILY WITH A MEAL. (Patient not taking: Reported on 09/09/2020)  . fexofenadine (ALLEGRA) 180 MG tablet Take 180 mg by mouth daily.  . fluticasone (FLONASE) 50 MCG/ACT nasal spray Place 2 sprays into both nostrils at bedtime.   .  fluticasone (FLOVENT HFA) 44 MCG/ACT inhaler Inhale 2 puffs into the lungs 2 (two) times daily. (Patient taking differently: Inhale 2 puffs into the lungs at bedtime.)  . furosemide (LASIX) 20 MG tablet Take 1 tablet (20 mg total) by mouth daily.  . Glucosamine-Chondroitin (OSTEO BI-FLEX REGULAR STRENGTH PO) Take 1 tablet by mouth daily.  Marland Kitchen levothyroxine (SYNTHROID, LEVOTHROID) 50 MCG tablet Take 50 mcg by mouth daily before breakfast.  . MAGNESIUM-OXIDE 400 (241.3 Mg) MG tablet TAKE 1 TABLET BY MOUTH DAILY  . metoprolol succinate (TOPROL-XL) 100 MG 24 hr tablet Take 100 mg (one tablet) in the morning and 50 mg (half a tablet) in the evening.  . metroNIDAZOLE (METROCREAM) 0.75 % cream Apply 1 application topically daily.  . mometasone (ELOCON) 0.1 % cream Apply 1 application topically 2 (two) times daily as needed (skin irritation).  . Multiple Vitamins-Minerals (CENTRUM MULTIGUMMIES) CHEW Chew 1 tablet by mouth daily.  Marland Kitchen omeprazole (PRILOSEC) 20 MG capsule Take 40 mg by mouth daily before breakfast.  . Psyllium (METAMUCIL) 28.3 % POWD Take 1  Scoop by mouth at bedtime.  . senna (SENOKOT) 8.6 MG tablet Take 1 tablet by mouth every evening.  . verapamil (CALAN-SR) 180 MG CR tablet Take 180 mg by mouth 2 (two) times daily.  . [DISCONTINUED] flecainide (TAMBOCOR) 50 MG tablet Take 1 tablet (50 mg total) by mouth 2 (two) times daily.  . [DISCONTINUED] furosemide (LASIX) 40 MG tablet TAKE 1 TABLET(40 MG) BY MOUTH DAILY  . [DISCONTINUED] furosemide (LASIX) 40 MG tablet TAKE 1 TABLET (40 MG TOTAL) BY MOUTH DAILY.  . [DISCONTINUED] irbesartan (AVAPRO) 150 MG tablet TAKE 1 TABLET(150 MG) BY MOUTH DAILY     Allergies:   Clindamycin hcl, Augmentin [amoxicillin-pot clavulanate], Codeine, Demerol [meperidine], and Sulfa antibiotics   Social History   Socioeconomic History  . Marital status: Divorced    Spouse name: Not on file  . Number of children: Not on file  . Years of education: Not on file  .  Highest education level: Not on file  Occupational History  . Not on file  Tobacco Use  . Smoking status: Former Research scientist (life sciences)  . Smokeless tobacco: Never Used  . Tobacco comment: 07/30/2016 "social smoker only; in college"  Vaping Use  . Vaping Use: Never used  Substance and Sexual Activity  . Alcohol use: Yes    Alcohol/week: 2.0 standard drinks    Types: 1 Glasses of wine, 1 Standard drinks or equivalent per week    Comment: 07/30/2016 "couple drinks/month"  . Drug use: No  . Sexual activity: Not Currently  Other Topics Concern  . Not on file  Social History Narrative  . Not on file   Social Determinants of Health   Financial Resource Strain: Not on file  Food Insecurity: Not on file  Transportation Needs: Not on file  Physical Activity: Not on file  Stress: Not on file  Social Connections: Not on file     Family History: The patient's family history includes Emphysema in her father; Heart failure in her mother; Hypertension in her father and mother. ROS:   Please see the history of present illness.   All other systems are reviewed and are negative.   EKGs/Labs/Other Studies Reviewed:     EKG: Not ordered today.  Recent Labs: November 20, 2018 creatinine 1.28, potassium 4.6, normal TSH 08/17/2019 hemoglobin A1c 6%, creatinine 1.17, potassium 4.6 BMET    Component Value Date/Time   NA 139 09/09/2020 1128   NA 137 06/07/2020 1045   K 4.0 09/09/2020 1128   CL 107 09/09/2020 1128   CO2 20 (L) 09/09/2020 1105   GLUCOSE 132 (H) 09/09/2020 1128   BUN 19 09/09/2020 1128   BUN 24 06/07/2020 1045   CREATININE 0.90 09/09/2020 1128   CREATININE 1.16 (H) 09/06/2020 0754   CREATININE 1.49 (H) 08/13/2016 1556   CALCIUM 8.5 (L) 09/09/2020 1105   GFRNONAA 55 (L) 09/09/2020 1105   GFRNONAA 49 (L) 09/06/2020 0754   GFRNONAA 35 (L) 08/13/2016 1556   GFRAA 42 (L) 06/07/2020 1045   GFRAA 41 (L) 08/13/2016 1556     Recent Lipid Panel November 20, 2018 total cholesterol 166, HDL 36, LDL  95, triglycerides 176     Component Value Date/Time   CHOL 146 07/31/2016 0435   TRIG 174 (H) 07/31/2016 0435   HDL 32 (L) 07/31/2016 0435   CHOLHDL 4.6 07/31/2016 0435   VLDL 35 07/31/2016 0435   LDLCALC 79 07/31/2016 0435    Physical Exam:    VS:  BP (!) 145/76   Pulse 65  Ht 5\' 6"  (1.676 m)   Wt 270 lb 6.4 oz (122.7 kg)   SpO2 98%   BMI 43.64 kg/m     Wt Readings from Last 3 Encounters:  09/06/20 270 lb 12.8 oz (122.8 kg)  09/05/20 270 lb 6.4 oz (122.7 kg)  06/21/20 268 lb (121.6 kg)      General: Alert, oriented x3, no distress, morbidly obese Head: no evidence of trauma, PERRL, EOMI, no exophtalmos or lid lag, no myxedema, no xanthelasma; normal ears, nose and oropharynx Neck: normal jugular venous pulsations and no hepatojugular reflux; brisk carotid pulses without delay and no carotid bruits Chest: clear to auscultation, no signs of consolidation by percussion or palpation, normal fremitus, symmetrical and full respiratory excursions Cardiovascular: normal position and quality of the apical impulse, regular rhythm, normal first and second heart sounds, 2/6 early peaking aortic ejection murmur no diastolic murmurs, rubs or gallops Abdomen: no tenderness or distention, no masses by palpation, no abnormal pulsatility or arterial bruits, normal bowel sounds, no hepatosplenomegaly Extremities: no clubbing, cyanosis or edema; 2+ radial, ulnar and brachial pulses bilaterally; 2+ right femoral, posterior tibial and dorsalis pedis pulses; 2+ left femoral, posterior tibial and dorsalis pedis pulses; no subclavian or femoral bruits Neurological: grossly nonfocal Psych: Normal mood and affect     ASSESSMENT:    1. Paroxysmal atrial fibrillation (HCC)   2. Long term current use of anticoagulant   3. Essential hypertension, benign   4. Diastolic dysfunction   5. OSA (obstructive sleep apnea)   6. Morbid obesity ([place])   7. Nonrheumatic mitral valve regurgitation   8.  Nonrheumatic aortic valve insufficiency   9. Migraine aura without headache    PLAN:    In order of problems listed above:  1. AFib: No events since starting flecainide and undergoing cardioversion in February.  Tolerating flecainide so far without side effects (the episode of heart failure occurred immediately after cardioversion and was likely related to the prolonged period of time spent in atrial fibrillation).  CHADSVasc 3 (age, gender, HTN).  2. Anticoagulation: Denies bleeding problems. 3. HTN: Reports that her typical systolic blood pressure at home is around 135.  Increase the metoprolol.  Stop the irbesartan. 4. Diastolic dysfunction: This was described on an echo performed almost immediately after cardioversion and cannot exclude a component of atrial mechanical stunning.  She did have heart failure immediately following the cardioversion in February and probably has some degree of diastolic dysfunction. 5. OSA: Reports compliance with CPAP, denies daytime hypersomnolence. 6. Obesity: Aware of the direct relationship between weight and the overall burden of atrial fibrillation. 7. MR/AR: Both mild MR and moderate AR have been stable on echocardiograms performed over the last 4 years.  Neither one of them appears to be severe enough to cause the arrhythmia or heart failure. 8. Migraines: She did better when she was on the higher dose of metoprolol.  We will increase this back to a total of 150 mg a day.  We will stop the irbesartan.  Medication Adjustments/Labs and Tests Ordered: Current medicines are reviewed at length with the patient today.  Concerns regarding medicines are outlined above.  No orders of the defined types were placed in this encounter.  Meds ordered this encounter  Medications  . furosemide (LASIX) 20 MG tablet    Sig: Take 1 tablet (20 mg total) by mouth daily.    Dispense:  90 tablet    Refill:  1   Patient Instructions  Medication Instructions:  METOPROLOL: Take 100 mg in the morning and 50 mg in the evening FUROSEMIDE: Take 20 mg once daily  STOP the Irbesartan  *If you need a refill on your cardiac medications before your next appointment, please call your pharmacy*   Lab Work: None ordered If you have labs (blood work) drawn today and your tests are completely normal, you will receive your results only by: Marland Kitchen MyChart Message (if you have MyChart) OR . A paper copy in the mail If you have any lab test that is abnormal or we need to change your treatment, we will call you to review the results.   Testing/Procedures: None ordered   Follow-Up: At [facility], you and your health needs are our priority.  As part of our continuing mission to provide you with exceptional heart care, we have created designated Provider Care Teams.  These Care Teams include your primary Cardiologist (physician) and Advanced Practice Providers (APPs -  Physician Assistants and Nurse Practitioners) who all work together to provide you with the care you need, when you need it.  We recommend signing up for the patient portal called "MyChart".  Sign up information is provided on this After Visit Summary.  MyChart is used to connect with patients for Virtual Visits (Telemedicine).  Patients are able to view lab/test results, encounter notes, upcoming appointments, etc.  Non-urgent messages can be sent to your provider as well.   To learn more about what you can do with MyChart, go to [email or web address].    Your next appointment:   Follow up in 3 months with Roderic Palau at the Afib clinic Follow up in 6 months with Dr. Sallyanne Kuster.      Signed, Sanda Klein, MD  09/13/2020 1:22 PM    [facility]

## 2020-09-05 NOTE — Patient Instructions (Signed)
Medication Instructions:  METOPROLOL: Take 100 mg in the morning and 50 mg in the evening FUROSEMIDE: Take 20 mg once daily  STOP the Irbesartan  *If you need a refill on your cardiac medications before your next appointment, please call your pharmacy*   Lab Work: None ordered If you have labs (blood work) drawn today and your tests are completely normal, you will receive your results only by: Marland Kitchen MyChart Message (if you have MyChart) OR . A paper copy in the mail If you have any lab test that is abnormal or we need to change your treatment, we will call you to review the results.   Testing/Procedures: None ordered   Follow-Up: At [facility], you and your health needs are our priority.  As part of our continuing mission to provide you with exceptional heart care, we have created designated Provider Care Teams.  These Care Teams include your primary Cardiologist (physician) and Advanced Practice Providers (APPs -  Physician Assistants and Nurse Practitioners) who all work together to provide you with the care you need, when you need it.  We recommend signing up for the patient portal called "MyChart".  Sign up information is provided on this After Visit Summary.  MyChart is used to connect with patients for Virtual Visits (Telemedicine).  Patients are able to view lab/test results, encounter notes, upcoming appointments, etc.  Non-urgent messages can be sent to your provider as well.   To learn more about what you can do with MyChart, go to [email or web address].    Your next appointment:   Follow up in 3 months with Roderic Palau at the Afib clinic Follow up in 6 months with Dr. Sallyanne Kuster.

## 2020-09-06 ENCOUNTER — Inpatient Hospital Stay: Payer: PPO

## 2020-09-06 ENCOUNTER — Inpatient Hospital Stay: Payer: PPO | Attending: Physician Assistant

## 2020-09-06 ENCOUNTER — Encounter: Payer: Self-pay | Admitting: Physician Assistant

## 2020-09-06 ENCOUNTER — Inpatient Hospital Stay: Payer: PPO | Admitting: Physician Assistant

## 2020-09-06 VITALS — BP 132/79 | HR 71 | Temp 97.7°F | Resp 19 | Ht 66.0 in | Wt 270.8 lb

## 2020-09-06 VITALS — BP 118/61 | HR 55 | Temp 98.7°F | Resp 18

## 2020-09-06 DIAGNOSIS — K5900 Constipation, unspecified: Secondary | ICD-10-CM | POA: Insufficient documentation

## 2020-09-06 DIAGNOSIS — Z7901 Long term (current) use of anticoagulants: Secondary | ICD-10-CM | POA: Insufficient documentation

## 2020-09-06 DIAGNOSIS — D509 Iron deficiency anemia, unspecified: Secondary | ICD-10-CM

## 2020-09-06 DIAGNOSIS — I4891 Unspecified atrial fibrillation: Secondary | ICD-10-CM | POA: Diagnosis not present

## 2020-09-06 DIAGNOSIS — D649 Anemia, unspecified: Secondary | ICD-10-CM

## 2020-09-06 LAB — CMP (CANCER CENTER ONLY)
ALT: 10 U/L (ref 0–44)
AST: 12 U/L — ABNORMAL LOW (ref 15–41)
Albumin: 3.8 g/dL (ref 3.5–5.0)
Alkaline Phosphatase: 115 U/L (ref 38–126)
Anion gap: 13 (ref 5–15)
BUN: 23 mg/dL (ref 8–23)
CO2: 22 mmol/L (ref 22–32)
Calcium: 8.9 mg/dL (ref 8.9–10.3)
Chloride: 106 mmol/L (ref 98–111)
Creatinine: 1.16 mg/dL — ABNORMAL HIGH (ref 0.44–1.00)
GFR, Estimated: 49 mL/min — ABNORMAL LOW (ref >60)
Glucose, Bld: 103 mg/dL — ABNORMAL HIGH (ref 70–99)
Potassium: 4 mmol/L (ref 3.5–5.1)
Sodium: 141 mmol/L (ref 135–145)
Total Bilirubin: 0.4 mg/dL (ref 0.3–1.2)
Total Protein: 7.4 g/dL (ref 6.5–8.1)

## 2020-09-06 LAB — CBC WITH DIFFERENTIAL (CANCER CENTER ONLY)
Abs Immature Granulocytes: 0.02 10*3/uL (ref 0.00–0.07)
Basophils Absolute: 0.1 10*3/uL (ref 0.0–0.1)
Basophils Relative: 1 %
Eosinophils Absolute: 0.1 10*3/uL (ref 0.0–0.5)
Eosinophils Relative: 2 %
HCT: 37 % (ref 36.0–46.0)
Hemoglobin: 11.5 g/dL — ABNORMAL LOW (ref 12.0–15.0)
Immature Granulocytes: 0 %
Lymphocytes Relative: 21 %
Lymphs Abs: 1.7 10*3/uL (ref 0.7–4.0)
MCH: 23.8 pg — ABNORMAL LOW (ref 26.0–34.0)
MCHC: 31.1 g/dL (ref 30.0–36.0)
MCV: 76.4 fL — ABNORMAL LOW (ref 80.0–100.0)
Monocytes Absolute: 0.5 10*3/uL (ref 0.1–1.0)
Monocytes Relative: 6 %
Neutro Abs: 5.6 10*3/uL (ref 1.7–7.7)
Neutrophils Relative %: 70 %
Platelet Count: 319 10*3/uL (ref 150–400)
RBC: 4.84 MIL/uL (ref 3.87–5.11)
RDW: 18.1 % — ABNORMAL HIGH (ref 11.5–15.5)
WBC Count: 8 10*3/uL (ref 4.0–10.5)
nRBC: 0 % (ref 0.0–0.2)

## 2020-09-06 LAB — IRON AND TIBC
Iron: 30 ug/dL — ABNORMAL LOW (ref 41–142)
Saturation Ratios: 7 % — ABNORMAL LOW (ref 21–57)
TIBC: 439 ug/dL (ref 236–444)
UIBC: 409 ug/dL — ABNORMAL HIGH (ref 120–384)

## 2020-09-06 LAB — FERRITIN: Ferritin: <4 ng/mL — ABNORMAL LOW (ref 11–307)

## 2020-09-06 MED ORDER — ACETAMINOPHEN 325 MG PO TABS
650.0000 mg | ORAL_TABLET | Freq: Once | ORAL | Status: AC
  Administered 2020-09-06: 650 mg via ORAL

## 2020-09-06 MED ORDER — SODIUM CHLORIDE 0.9 % IV SOLN
300.0000 mg | Freq: Once | INTRAVENOUS | Status: AC
  Administered 2020-09-06: 300 mg via INTRAVENOUS
  Filled 2020-09-06: qty 300

## 2020-09-06 MED ORDER — DIPHENHYDRAMINE HCL 25 MG PO CAPS: 50.0000 mg | ORAL_CAPSULE | Freq: Once | ORAL | Status: DC

## 2020-09-06 MED ORDER — ACETAMINOPHEN 325 MG PO TABS
ORAL_TABLET | ORAL | Status: AC
  Filled 2020-09-06: qty 2

## 2020-09-06 MED ORDER — SODIUM CHLORIDE 0.9 % IV SOLN
Freq: Once | INTRAVENOUS | Status: AC
  Filled 2020-09-06: qty 250

## 2020-09-06 NOTE — Patient Instructions (Signed)

## 2020-09-07 DIAGNOSIS — K5901 Slow transit constipation: Secondary | ICD-10-CM | POA: Diagnosis not present

## 2020-09-07 DIAGNOSIS — M6281 Muscle weakness (generalized): Secondary | ICD-10-CM | POA: Diagnosis not present

## 2020-09-07 DIAGNOSIS — R151 Fecal smearing: Secondary | ICD-10-CM | POA: Diagnosis not present

## 2020-09-08 ENCOUNTER — Telehealth: Payer: Self-pay | Admitting: Physician Assistant

## 2020-09-08 NOTE — Telephone Encounter (Signed)
Scheduled per los. Called and spoke with patient. Confirmed appt 

## 2020-09-09 ENCOUNTER — Encounter (HOSPITAL_COMMUNITY): Payer: Self-pay | Admitting: Emergency Medicine

## 2020-09-09 ENCOUNTER — Emergency Department (HOSPITAL_COMMUNITY)
Admission: EM | Admit: 2020-09-09 | Discharge: 2020-09-09 | Disposition: A | Discharge status: Home / Self Care | Payer: PPO | Attending: Emergency Medicine | Admitting: Emergency Medicine

## 2020-09-09 ENCOUNTER — Other Ambulatory Visit: Payer: Self-pay

## 2020-09-09 ENCOUNTER — Emergency Department (HOSPITAL_COMMUNITY): Payer: PPO

## 2020-09-09 DIAGNOSIS — Z7952 Long term (current) use of systemic steroids: Secondary | ICD-10-CM | POA: Insufficient documentation

## 2020-09-09 DIAGNOSIS — E039 Hypothyroidism, unspecified: Secondary | ICD-10-CM | POA: Diagnosis not present

## 2020-09-09 DIAGNOSIS — I130 Hypertensive heart and chronic kidney disease with heart failure and stage 1 through stage 4 chronic kidney disease, or unspecified chronic kidney disease: Secondary | ICD-10-CM | POA: Insufficient documentation

## 2020-09-09 DIAGNOSIS — Z87891 Personal history of nicotine dependence: Secondary | ICD-10-CM | POA: Diagnosis not present

## 2020-09-09 DIAGNOSIS — I509 Heart failure, unspecified: Secondary | ICD-10-CM | POA: Diagnosis not present

## 2020-09-09 DIAGNOSIS — J45909 Unspecified asthma, uncomplicated: Secondary | ICD-10-CM | POA: Insufficient documentation

## 2020-09-09 DIAGNOSIS — Z79899 Other long term (current) drug therapy: Secondary | ICD-10-CM | POA: Insufficient documentation

## 2020-09-09 DIAGNOSIS — N183 Chronic kidney disease, stage 3 unspecified: Secondary | ICD-10-CM | POA: Diagnosis not present

## 2020-09-09 DIAGNOSIS — R110 Nausea: Secondary | ICD-10-CM | POA: Diagnosis not present

## 2020-09-09 DIAGNOSIS — Z7901 Long term (current) use of anticoagulants: Secondary | ICD-10-CM | POA: Diagnosis not present

## 2020-09-09 DIAGNOSIS — N3000 Acute cystitis without hematuria: Secondary | ICD-10-CM

## 2020-09-09 DIAGNOSIS — R42 Dizziness and giddiness: Secondary | ICD-10-CM | POA: Diagnosis not present

## 2020-09-09 DIAGNOSIS — I10 Essential (primary) hypertension: Secondary | ICD-10-CM | POA: Diagnosis not present

## 2020-09-09 DIAGNOSIS — G319 Degenerative disease of nervous system, unspecified: Secondary | ICD-10-CM | POA: Diagnosis not present

## 2020-09-09 LAB — COMPREHENSIVE METABOLIC PANEL
ALT: 15 U/L (ref 0–44)
AST: 17 U/L (ref 15–41)
Albumin: 3.6 g/dL (ref 3.5–5.0)
Alkaline Phosphatase: 87 U/L (ref 38–126)
Anion gap: 11 (ref 5–15)
BUN: 17 mg/dL (ref 8–23)
CO2: 20 mmol/L — ABNORMAL LOW (ref 22–32)
Calcium: 8.5 mg/dL — ABNORMAL LOW (ref 8.9–10.3)
Chloride: 107 mmol/L (ref 98–111)
Creatinine, Ser: 1.07 mg/dL — ABNORMAL HIGH (ref 0.44–1.00)
GFR, Estimated: 55 mL/min — ABNORMAL LOW (ref >60)
Glucose, Bld: 134 mg/dL — ABNORMAL HIGH (ref 70–99)
Potassium: 4 mmol/L (ref 3.5–5.1)
Sodium: 138 mmol/L (ref 135–145)
Total Bilirubin: 0.6 mg/dL (ref 0.3–1.2)
Total Protein: 6.9 g/dL (ref 6.5–8.1)

## 2020-09-09 LAB — URINALYSIS, ROUTINE W REFLEX MICROSCOPIC
Bilirubin Urine: NEGATIVE
Glucose, UA: NEGATIVE mg/dL
Hgb urine dipstick: NEGATIVE
Ketones, ur: NEGATIVE mg/dL
Nitrite: NEGATIVE
Protein, ur: NEGATIVE mg/dL
Specific Gravity, Urine: 1.004 — ABNORMAL LOW (ref 1.005–1.030)
pH: 6 (ref 5.0–8.0)

## 2020-09-09 LAB — DIFFERENTIAL
Abs Immature Granulocytes: 0.04 10*3/uL (ref 0.00–0.07)
Basophils Absolute: 0.1 10*3/uL (ref 0.0–0.1)
Basophils Relative: 1 %
Eosinophils Absolute: 0.1 10*3/uL (ref 0.0–0.5)
Eosinophils Relative: 2 %
Immature Granulocytes: 0 %
Lymphocytes Relative: 24 %
Lymphs Abs: 2.3 10*3/uL (ref 0.7–4.0)
Monocytes Absolute: 0.6 10*3/uL (ref 0.1–1.0)
Monocytes Relative: 7 %
Neutro Abs: 6.3 10*3/uL (ref 1.7–7.7)
Neutrophils Relative %: 66 %

## 2020-09-09 LAB — I-STAT CHEM 8, ED
BUN: 19 mg/dL (ref 8–23)
Calcium, Ion: 1.02 mmol/L — ABNORMAL LOW (ref 1.15–1.40)
Chloride: 107 mmol/L (ref 98–111)
Creatinine, Ser: 0.9 mg/dL (ref 0.44–1.00)
Glucose, Bld: 132 mg/dL — ABNORMAL HIGH (ref 70–99)
HCT: 37 % (ref 36.0–46.0)
Hemoglobin: 12.6 g/dL (ref 12.0–15.0)
Potassium: 4 mmol/L (ref 3.5–5.1)
Sodium: 139 mmol/L (ref 135–145)
TCO2: 19 mmol/L — ABNORMAL LOW (ref 22–32)

## 2020-09-09 LAB — CBC
HCT: 36.9 % (ref 36.0–46.0)
Hemoglobin: 11.2 g/dL — ABNORMAL LOW (ref 12.0–15.0)
MCH: 23.3 pg — ABNORMAL LOW (ref 26.0–34.0)
MCHC: 30.4 g/dL (ref 30.0–36.0)
MCV: 76.7 fL — ABNORMAL LOW (ref 80.0–100.0)
Platelets: 323 10*3/uL (ref 150–400)
RBC: 4.81 MIL/uL (ref 3.87–5.11)
RDW: 18.5 % — ABNORMAL HIGH (ref 11.5–15.5)
WBC: 9.5 10*3/uL (ref 4.0–10.5)
nRBC: 0 % (ref 0.0–0.2)

## 2020-09-09 LAB — CBG MONITORING, ED: Glucose-Capillary: 135 mg/dL — ABNORMAL HIGH (ref 70–99)

## 2020-09-09 LAB — APTT: aPTT: 33 seconds (ref 24–36)

## 2020-09-09 LAB — PROTIME-INR
INR: 1.2 (ref 0.8–1.2)
Prothrombin Time: 15 seconds (ref 11.4–15.2)

## 2020-09-09 MED ORDER — LACTATED RINGERS IV BOLUS
1000.0000 mL | Freq: Once | INTRAVENOUS | Status: AC
  Administered 2020-09-09: 1000 mL via INTRAVENOUS

## 2020-09-09 MED ORDER — ONDANSETRON HCL 4 MG/2ML IJ SOLN
4.0000 mg | Freq: Once | INTRAMUSCULAR | Status: AC
  Administered 2020-09-09: 4 mg via INTRAVENOUS
  Filled 2020-09-09: qty 2

## 2020-09-09 MED ORDER — CEPHALEXIN 500 MG PO CAPS: 500.0000 mg | ORAL_CAPSULE | Freq: Four times a day (QID) | ORAL | 0 refills | Status: AC

## 2020-09-09 MED ORDER — CEPHALEXIN 250 MG PO CAPS
500.0000 mg | ORAL_CAPSULE | Freq: Once | ORAL | Status: AC
  Administered 2020-09-09: 500 mg via ORAL
  Filled 2020-09-09: qty 2

## 2020-09-09 MED ORDER — MECLIZINE HCL 25 MG PO TABS
50.0000 mg | ORAL_TABLET | Freq: Once | ORAL | Status: AC
  Administered 2020-09-09: 50 mg via ORAL
  Filled 2020-09-09: qty 2

## 2020-09-09 MED ORDER — SODIUM CHLORIDE 0.9% FLUSH
3.0000 mL | Freq: Once | INTRAVENOUS | Status: AC
  Administered 2020-09-09: 3 mL via INTRAVENOUS

## 2020-09-09 MED ORDER — LORAZEPAM 2 MG/ML IJ SOLN
1.0000 mg | INTRAMUSCULAR | Status: DC | PRN
  Administered 2020-09-09: 1 mg via INTRAVENOUS
  Filled 2020-09-09: qty 1

## 2020-09-09 NOTE — ED Triage Notes (Signed)
Pt to triage via [facility] from her car.  She pulled over while driving because she had sudden onset of dizziness.  Recently took off 1 BP medication and metoprolol increased.  HR 60s.  Reports nausea.  No vomiting.  CBG 115.  Hx of Afib.  No arm drift. 20g LFA.  History of vertigo in the past but states it doesn't feel the same.  Also reports "mini migraine" when she woke up this morning that resolved.

## 2020-09-09 NOTE — ED Notes (Signed)
Patient transported to CT 

## 2020-09-09 NOTE — ED Provider Notes (Signed)
Emergency Medicine Provider Triage Evaluation Note  Leslie [name] , a 75 y.o. female  was evaluated in triage.  Pt complains of dizziness.  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  No other focal neuro deficit  Medical Decision Making  Medically screening exam initiated at 11:14 AM.  Appropriate orders placed.  Leslie [name] was informed that the remainder of the evaluation will be completed by another provider, this initial triage assessment does not replace that evaluation, and the importance of remaining in the ED until their evaluation is complete.  Hx of anemia, on iron transfusion, have been having lightheadedness for the past 1 1/2 weeks.  Developed recurrent headache and now having room spinning sensation.     Domenic Moras, PA-C 09/09/20 1116    Horton, Alvin Critchley, DO 09/09/20 1516

## 2020-09-09 NOTE — ED Notes (Addendum)
Patient transported to MRI 

## 2020-09-09 NOTE — ED Provider Notes (Signed)
Medical Decision Making: Care of patient assumed from Dr. Dina Rich at 1500.  Agree with history, physical exam and plan.  See their note for further details.  Briefly, The pt p/w sudden onset dizziness, history of migraines, has had vertigo in the past as well.  Imaging and work-up thus far is negative..   Current plan is as follows: Reassess and MRI.  MRI shows no acute ischemic change, microvascular disease is present.  Urine shows signs of possible infection.  Other evaluations unremarkable.  Patient safe for discharge home antibiotics prescribed first dose given here.   I personally reviewed and interpreted all labs/imaging.      Breck Coons, MD 09/09/20 2017

## 2020-09-09 NOTE — ED Provider Notes (Addendum)
[facility] Provider Note   CSN: [id number] Arrival date & time: 09/09/20  1051     History Chief Complaint  Patient presents with  . Dizziness    Leslie [name] is a 75 y.o. female.  HPI   75 year old female medical history of HTN, HLD, CKD, migraines presents the emergency department concern for dizziness.  Patient states when she woke up this morning she had one of her "mini migraines".  She describes this as an aura of floaters that then results with a mild headache.  She states this is typical for her.  While driving patient had sudden onset severe dizziness.  She states it is a combination of the room spinning sensation along with being foggy like she was going to pass out.  She pulled over and when this did not subside and became more severe she called EMS.  The sensation is present on arrival, not relieved by rest.  No associated vision changes, facial droop or focal neurologic complaint.  Patient has had episodes of vertigo in the past but not for many years, nothing this severe.  She denies any chest pain or shortness of breath.  Admits that recently her doctors have changed her medical regimen, mainly in decreasing the metoprolol.  Otherwise denies any other acute illness, fever  Past Medical History:  Diagnosis Date  . Adenomatous polyp   . Arthritis    "knees, legs, fingers" (07/30/2016)  . Asthma   . Bursitis of left shoulder    "just finished PT" (07/30/2016)  . Chest pain    a. 2003 Abnl stress test-->Cath: nonobs CAD.  Marland Kitchen Chronic bronchitis ([place])   . Chronic kidney disease    stage 3  . GERD (gastroesophageal reflux disease)    barrett's esophagus- Dr. Cristina Gong  . History of hiatal hernia   . Hyperlipidemia   . Hypertension   . Hypothyroidism   . Migraines    "sporatic; at least a few/year" (07/30/2016)  . Mitral regurgitation    a. 07/2016 Echo: mild to mod MR;  b. 07/2016 TEE: mild MR.  . Moderate aortic insufficiency     a. 07/2016 Echo: EF 55-60%, mod AI;  b. 07/2016 TEE: EF 50-55%, mod AI.  Marland Kitchen Obesity    s/p lap band surgery 6/10  . OSA on CPAP   . PAF (paroxysmal atrial fibrillation) ([place])    a. 07/2016 TEE/DCCV: EF 50-55%, mild MR, mod AI, mild to mod TR, neg bubble study-->successful DCCV x1 w/ 120J.  Marland Kitchen PONV (postoperative nausea and vomiting)   . Vitamin D deficiency     Patient Active Problem List   Diagnosis Date Noted  . Anemia 06/21/2020  . Leukocytosis 06/21/2020  . Acute CHF ([place]) 05/31/2020  . Acute CHF (congestive heart failure) ([place]) 05/30/2020  . OSA (obstructive sleep apnea) 11/16/2016  . Chronic anticoagulation 09/17/2016  . Mitral valve insufficiency   . Aortic valve regurgitation   . Persistent atrial fibrillation ([place]) 07/30/2016  . Essential hypertension, benign 06/07/2013  . Unspecified vitamin D deficiency 06/07/2013  . Pure hypercholesterolemia 06/07/2013  . Morbid obesity ([place]) 06/07/2013  . Hypothyroidism 06/07/2013  . Chronic kidney disease, stage III (moderate) ([place]) 06/07/2013  . Contact dermatitis and other eczema, due to unspecified cause 06/07/2013    Past Surgical History:  Procedure Laterality Date  . APPENDECTOMY     incidental  . CARDIOVERSION N/A 08/01/2016   Procedure: CARDIOVERSION;  Surgeon: Jerline Pain, MD;  Location: [facility];  Service:  Cardiovascular;  Laterality: N/A;  . CARDIOVERSION N/A 06/24/2019   Procedure: CARDIOVERSION;  Surgeon: Sanda Klein, MD;  Location: [facility];  Service: Cardiovascular;  Laterality: N/A;  . CARDIOVERSION N/A 05/30/2020   Procedure: CARDIOVERSION;  Surgeon: Sanda Klein, MD;  Location: [facility];  Service: Cardiovascular;  Laterality: N/A;  . CARDIOVERSION N/A 05/09/2020   Procedure: CARDIOVERSION;  Surgeon: [name], MD;  Location: [facility] [place];  Service: Cardiovascular;  Laterality: N/A;  . CATARACT EXTRACTION W/ INTRAOCULAR LENS  IMPLANT, BILATERAL Bilateral ~ 2008-2017   left - right  .  COMBINED HYSTERECTOMY VAGINAL / OOPHORECTOMY / A&P REPAIR  1981  . HERNIA REPAIR    . INGUINAL HERNIA REPAIR Left 1952  . LAPAROSCOPIC GASTRIC BANDING WITH HIATAL HERNIA REPAIR  09/2008  . TEE WITHOUT CARDIOVERSION N/A 08/01/2016   Procedure: TRANSESOPHAGEAL ECHOCARDIOGRAM (TEE);  Surgeon: Jerline Pain, MD;  Location: [facility];  Service: Cardiovascular;  Laterality: N/A;  . [place]  . VAGINAL HYSTERECTOMY  1981   "w/1 ovary"     OB History   No obstetric history on file.     Family History  Problem Relation Age of Onset  . Heart failure Mother   . Hypertension Mother   . Hypertension Father   . Emphysema Father     Social History   Tobacco Use  . Smoking status: Former Research scientist (life sciences)  . Smokeless tobacco: Never Used  . Tobacco comment: 07/30/2016 "social smoker only; in college"  Vaping Use  . Vaping Use: Never used  Substance Use Topics  . Alcohol use: Yes    Alcohol/week: 2.0 standard drinks    Types: 1 Glasses of wine, 1 Standard drinks or equivalent per week    Comment: 07/30/2016 "couple drinks/month"  . Drug use: No    Home Medications Prior to Admission medications   Medication Sig Start Date End Date Taking? Authorizing Provider  acetaminophen (TYLENOL) 650 MG CR tablet Take 1,300 mg by mouth every 8 (eight) hours as needed for pain.    [provider]  apixaban (ELIQUIS) 5 MG TABS tablet TAKE 1 TABLET(5 MG) BY MOUTH TWICE DAILY 07/27/20   Sherran Needs, NP  atorvastatin (LIPITOR) 10 MG tablet Take 10 mg by mouth daily.    [provider]  Bacillus Coagulans-Inulin (ALIGN PREBIOTIC-PROBIOTIC PO) Take 2 capsules by mouth daily. Gummy    [provider]  cholecalciferol (VITAMIN D) 1000 UNITS tablet Take 1,000 Units by mouth daily.    [provider]  diclofenac Sodium (VOLTAREN) 1 % GEL Apply 1 application topically 2 (two) times daily.    [provider]  ferrous sulfate 325 (65 FE) MG  tablet TAKE 1 TABLET (325 MG TOTAL) BY MOUTH 2 (TWO) TIMES DAILY WITH A MEAL. 06/03/20 06/03/21  Pahwani, Michell Heinrich, MD  fexofenadine (ALLEGRA) 180 MG tablet Take 180 mg by mouth daily.    [provider]  flecainide (TAMBOCOR) 50 MG tablet Take 1 tablet (50 mg total) by mouth 2 (two) times daily. 05/25/20   Croitoru, Mihai, MD  fluticasone (FLONASE) 50 MCG/ACT nasal spray Place 2 sprays into both nostrils at bedtime.     [provider]  fluticasone (FLOVENT HFA) 44 MCG/ACT inhaler Inhale 2 puffs into the lungs 2 (two) times daily. 03/04/18   Croitoru, Mihai, MD  furosemide (LASIX) 20 MG tablet Take 1 tablet (20 mg total) by mouth daily. 09/05/20 12/04/20  Croitoru, Dani Gobble, MD  Glucosamine-Chondroitin (OSTEO BI-FLEX REGULAR STRENGTH PO) Take 1  tablet by mouth daily.    [provider]  levothyroxine (SYNTHROID, LEVOTHROID) 50 MCG tablet Take 50 mcg by mouth daily before breakfast.    [provider]  MAGNESIUM-OXIDE 400 (241.3 Mg) MG tablet TAKE 1 TABLET BY MOUTH DAILY 01/10/20   Croitoru, Dani Gobble, MD  metoprolol succinate (TOPROL-XL) 100 MG 24 hr tablet Take 100 mg (one tablet) in the morning and 50 mg (half a tablet) in the evening. 07/28/19   Croitoru, Mihai, MD  metroNIDAZOLE (METROCREAM) 0.75 % cream Apply 1 application topically daily. 05/16/20   [provider]  mometasone (ELOCON) 0.1 % cream Apply 1 application topically 2 (two) times daily as needed (skin irritation). 05/18/20   [provider]  Multiple Vitamins-Minerals (CENTRUM MULTIGUMMIES) CHEW Chew 1 tablet by mouth daily.    [provider]  omeprazole (PRILOSEC) 20 MG capsule Take 40 mg by mouth daily before breakfast. 07/27/16   [provider]  Psyllium (METAMUCIL) 28.3 % POWD Take 1 Scoop by mouth daily.    [provider]  senna (SENOKOT) 8.6 MG tablet Take 1 tablet by mouth every evening.    [provider]  verapamil (CALAN-SR) 180 MG CR tablet Take 180  mg by mouth 2 (two) times daily.    [provider]    Allergies    Clindamycin hcl, Augmentin [amoxicillin-pot clavulanate], Codeine, Demerol [meperidine], and Sulfa antibiotics  Review of Systems   Review of Systems  Constitutional: Negative for chills and fever.  HENT: Negative for congestion.   Eyes: Negative for visual disturbance.  Respiratory: Negative for shortness of breath.   Cardiovascular: Negative for chest pain.  Gastrointestinal: Negative for abdominal pain, diarrhea and vomiting.  Genitourinary: Negative for dysuria.  Musculoskeletal: Positive for back pain and neck pain.  Skin: Negative for rash.  Neurological: Positive for dizziness, weakness, light-headedness and headaches. Negative for facial asymmetry, speech difficulty and numbness.    Physical Exam Updated Vital Signs BP (!) 148/79   Pulse 61   Temp 97.7 F (36.5 C)   Resp 15   SpO2 99%   Physical Exam Vitals and nursing note reviewed.  Constitutional:      General: She is not in acute distress.    Appearance: Normal appearance.  HENT:     Head: Normocephalic.     Right Ear: External ear normal.     Left Ear: External ear normal.     Mouth/Throat:     Mouth: Mucous membranes are moist.  Eyes:     Extraocular Movements: Extraocular movements intact.     Pupils: Pupils are equal, round, and reactive to light.  Cardiovascular:     Rate and Rhythm: Normal rate.  Pulmonary:     Effort: Pulmonary effort is normal. No respiratory distress.  Abdominal:     Palpations: Abdomen is soft.     Tenderness: There is no abdominal tenderness.  Musculoskeletal:     Cervical back: No tenderness.  Skin:    General: Skin is warm.  Neurological:     General: No focal deficit present.     Mental Status: She is alert and oriented to person, place, and time. Mental status is at baseline.     Cranial Nerves: No cranial nerve deficit.  Psychiatric:        Mood and Affect: Mood normal.     ED Results  / Procedures / Treatments   Labs (all labs ordered are listed, but only abnormal results are displayed) Labs Reviewed  CBC - Abnormal;  Notable for the following components:      Result Value   Hemoglobin 11.2 (*)    MCV 76.7 (*)    MCH 23.3 (*)    RDW 18.5 (*)    All other components within normal limits  COMPREHENSIVE METABOLIC PANEL - Abnormal; Notable for the following components:   CO2 20 (*)    Glucose, Bld 134 (*)    Creatinine, Ser 1.07 (*)    Calcium 8.5 (*)    GFR, Estimated 55 (*)    All other components within normal limits  I-STAT CHEM 8, ED - Abnormal; Notable for the following components:   Glucose, Bld 132 (*)    Calcium, Ion 1.02 (*)    TCO2 19 (*)    All other components within normal limits  CBG MONITORING, ED - Abnormal; Notable for the following components:   Glucose-Capillary 135 (*)    All other components within normal limits  PROTIME-INR  APTT  DIFFERENTIAL  URINALYSIS, ROUTINE W REFLEX MICROSCOPIC    EKG EKG Interpretation  Date/Time:  Saturday Sep 09 2020 11:08:01 EDT Ventricular Rate:  62 PR Interval:  188 QRS Duration: 84 QT Interval:  448 QTC Calculation: 454 R Axis:   -10 Text Interpretation: Normal sinus rhythm Cannot rule out Anterior infarct , age undetermined Abnormal ECG Similar to previous Confirmed by Lavenia Atlas [phone]) on 09/09/2020 12:06:05 PM   Radiology CT HEAD WO CONTRAST  Result Date: 09/09/2020 CLINICAL DATA:  From dizziness.  History of migraines. EXAM: CT HEAD WITHOUT CONTRAST TECHNIQUE: Contiguous axial images were obtained from the base of the skull through the vertex without intravenous contrast. COMPARISON:  None. FINDINGS: Brain: No evidence of acute infarction, hemorrhage, hydrocephalus, extra-axial collection or mass lesion/mass effect. Mild patchy areas of white matter hypoattenuation are noted consistent with chronic microvascular ischemic change. Vascular: No hyperdense vessel or unexpected calcification.  Skull: Normal. Negative for fracture or focal lesion. Sinuses/Orbits: Globes and orbits are unremarkable. Sinuses are clear. Other: None. IMPRESSION: 1. No acute intracranial abnormalities. 2. Mild chronic microvascular ischemic change. Electronically Signed   By: Lajean Manes M.D.   On: 09/09/2020 11:41    Procedures Procedures   Medications Ordered in ED Medications  LORazepam (ATIVAN) injection 1 mg (has no administration in time range)  sodium chloride flush (NS) 0.9 % injection 3 mL (3 mLs Intravenous Given 09/09/20 1245)  ondansetron (ZOFRAN) injection 4 mg (4 mg Intravenous Given 09/09/20 1245)  meclizine (ANTIVERT) tablet 50 mg (50 mg Oral Given 09/09/20 1244)    ED Course  I have reviewed the triage vital signs and the nursing notes.  Pertinent labs & imaging results that were available during my care of the patient were reviewed by me and considered in my medical decision making (see chart for details).    MDM Rules/Calculators/A&P                          75 year old female presents emergency department concern for headache, dizziness/lightheadedness.  Vitals are stable on arrival.  Head CT shows chronic microvascular changes, blood work is reassuring and baseline.  Plan to treat for possible vertigo and obtain MRI for further evaluation.  Patient is pending MRI results, patient signed out to oncoming provider pending results and reevaluation.  Final Clinical Impression(s) / ED Diagnoses Final diagnoses:  None    Rx / DC Orders ED Discharge Orders    None       Ithzel Fedorchak, Alvin Critchley, DO  09/09/20 BoardmanAlvin Critchley, DO 09/09/20 1516

## 2020-09-09 NOTE — ED Notes (Signed)
The pt has returned from  [facility]   She is no longer dizzy laughing pleasant

## 2020-09-12 ENCOUNTER — Other Ambulatory Visit: Payer: Self-pay | Admitting: Cardiovascular Disease

## 2020-09-13 ENCOUNTER — Encounter: Payer: Self-pay | Admitting: Cardiovascular Disease

## 2020-09-14 ENCOUNTER — Telehealth: Payer: Self-pay | Admitting: Cardiovascular Disease

## 2020-09-14 ENCOUNTER — Ambulatory Visit (INDEPENDENT_AMBULATORY_CARE_PROVIDER_SITE_OTHER): Payer: PPO

## 2020-09-14 ENCOUNTER — Encounter: Payer: Self-pay | Admitting: *Deleted

## 2020-09-14 DIAGNOSIS — R42 Dizziness and giddiness: Secondary | ICD-10-CM

## 2020-09-14 NOTE — Telephone Encounter (Signed)
Spoke with pt, she reports she does have some dizziness now but not as bad. She repots the er doctor told her to see neurology for the headaches. She has left them a message but has not heard back yet. She reports she has not had a migraine in sometime. She just wanted to make [place] aware of what had happened. Aware will forward to dr croitoru for his review.

## 2020-09-14 NOTE — Telephone Encounter (Signed)
STAT if patient feels like he/she is going to faint   1) Are you dizzy now? Yes,  2) Do you feel faint or have you passed out? no  3) Do you have any other symptoms? no  4) Have you checked your HR and BP (record if available)? Everything was normal  Patient states she was in the ED last Saturday due to felling very very dizzy. She states she had mentioned some dizziness at her appointment Tuesday of last week, but Saturday she was in her care and felt like she was going to pass out. She states she pulled over and called 911. She states she does still feels dizzy, but not as bad. She states she has not passed out. She states her HR and BP were normal and had no other symptoms. She states she was diagnosed with a UTI. She states she is not sure if it is vertigo, but wanted to make Dr. Sallyanne Kuster aware.

## 2020-09-14 NOTE — Telephone Encounter (Signed)
Left message for pt to call.

## 2020-09-14 NOTE — Telephone Encounter (Signed)
Spoke with pt, aware of dr croitoru's recommendations. Order placed for monitor and information sent to the patient via my chart.

## 2020-09-14 NOTE — Telephone Encounter (Signed)
Thank you. I agree that it is probably not cardiac, but it is probably wise to have her wear a 7-day event monitor to make sure she is not having bradycardia or ventricular arrhythmia, because of the meds she is on. Could you order please?

## 2020-09-14 NOTE — Progress Notes (Unsigned)
Enrolled patient for a 7 day Zio XT Monitor to be mailed to patients home.  

## 2020-09-15 ENCOUNTER — Inpatient Hospital Stay: Payer: PPO | Attending: Physician Assistant

## 2020-09-15 ENCOUNTER — Other Ambulatory Visit: Payer: Self-pay

## 2020-09-15 VITALS — BP 117/73 | HR 53 | Temp 98.0°F | Resp 18

## 2020-09-15 DIAGNOSIS — D509 Iron deficiency anemia, unspecified: Secondary | ICD-10-CM | POA: Diagnosis not present

## 2020-09-15 MED ORDER — SODIUM CHLORIDE 0.9 % IV SOLN
Freq: Once | INTRAVENOUS | Status: AC
Start: 2020-09-15 — End: 2020-09-15
  Filled 2020-09-15: qty 250

## 2020-09-15 MED ORDER — DIPHENHYDRAMINE HCL 25 MG PO CAPS: 50.0000 mg | ORAL_CAPSULE | Freq: Once | ORAL | Status: DC

## 2020-09-15 MED ORDER — ACETAMINOPHEN 325 MG PO TABS: 650.0000 mg | ORAL_TABLET | Freq: Once | ORAL | Status: DC

## 2020-09-15 MED ORDER — DIPHENHYDRAMINE HCL 25 MG PO CAPS
ORAL_CAPSULE | ORAL | Status: AC
  Filled 2020-09-15: qty 1

## 2020-09-15 MED ORDER — SODIUM CHLORIDE 0.9 % IV SOLN
300.0000 mg | Freq: Once | INTRAVENOUS | Status: AC
  Administered 2020-09-15: 300 mg via INTRAVENOUS
  Filled 2020-09-15: qty 300

## 2020-09-15 NOTE — Patient Instructions (Signed)

## 2020-09-18 DIAGNOSIS — R42 Dizziness and giddiness: Secondary | ICD-10-CM | POA: Diagnosis not present

## 2020-09-19 ENCOUNTER — Other Ambulatory Visit: Payer: Self-pay

## 2020-09-19 ENCOUNTER — Inpatient Hospital Stay: Payer: PPO

## 2020-09-19 VITALS — BP 128/58 | HR 56 | Temp 98.5°F | Resp 18

## 2020-09-19 DIAGNOSIS — D509 Iron deficiency anemia, unspecified: Secondary | ICD-10-CM

## 2020-09-19 MED ORDER — DIPHENHYDRAMINE HCL 25 MG PO CAPS
ORAL_CAPSULE | ORAL | Status: AC
  Filled 2020-09-19: qty 2

## 2020-09-19 MED ORDER — IRON SUCROSE 20 MG/ML IV SOLN
300.0000 mg | Freq: Once | INTRAVENOUS | Status: AC
  Administered 2020-09-19: 300 mg via INTRAVENOUS
  Filled 2020-09-19: qty 300

## 2020-09-19 MED ORDER — SODIUM CHLORIDE 0.9 % IV SOLN
Freq: Once | INTRAVENOUS | Status: AC
  Filled 2020-09-19: qty 250

## 2020-09-19 MED ORDER — DIPHENHYDRAMINE HCL 25 MG PO CAPS: 50.0000 mg | ORAL_CAPSULE | Freq: Once | ORAL | Status: DC

## 2020-09-19 MED ORDER — ACETAMINOPHEN 325 MG PO TABS
ORAL_TABLET | ORAL | Status: AC
  Filled 2020-09-19: qty 2

## 2020-09-19 MED ORDER — ACETAMINOPHEN 325 MG PO TABS: 650.0000 mg | ORAL_TABLET | Freq: Once | ORAL | Status: DC

## 2020-09-19 NOTE — Progress Notes (Signed)
Pt declined to stay for 30 min observation after iron infusion. VSS and pt discharged in stable condition. Pt declined AVS and instructed to call with any questions or concerns.

## 2020-09-19 NOTE — Patient Instructions (Signed)

## 2020-09-22 DIAGNOSIS — R42 Dizziness and giddiness: Secondary | ICD-10-CM | POA: Diagnosis not present

## 2020-09-25 DIAGNOSIS — R42 Dizziness and giddiness: Secondary | ICD-10-CM | POA: Diagnosis not present

## 2020-09-26 ENCOUNTER — Other Ambulatory Visit: Payer: Self-pay

## 2020-09-26 MED ORDER — METOPROLOL SUCCINATE ER 100 MG PO TB24: ORAL_TABLET | ORAL | 3 refills | Status: DC

## 2020-09-27 DIAGNOSIS — R42 Dizziness and giddiness: Secondary | ICD-10-CM | POA: Diagnosis not present

## 2020-10-05 ENCOUNTER — Telehealth: Payer: Self-pay | Admitting: Cardiovascular Disease

## 2020-10-05 NOTE — Telephone Encounter (Signed)
Called patient, advised that she just was notified a few times as an alert that she had a HR below 50. Her recent BP/HR was 134/82 HR 54. She states that she feels okay other than dizziness/lightheadedness. I did advise that the monitor was not reviewed yet, but I would send it to Dr.C and have the doctor covering him take a look to see if there was anything to be done. Patient states that she is continuing all medications on her list, no changes. She was advised to continue to monitor BP/HR and even at times with moving positions, to see if the dizziness if being accompanied by a drop in BP or HR. Patient verbalized understanding. She was advised of when to seek care at the ED, and patient verbalized understanding.  Will route to MD.   Dr.Hilty do you mind taking a look at this?  Thanks!

## 2020-10-05 NOTE — Telephone Encounter (Signed)
    STAT if HR is under 50 or over 120 (normal HR is 60-100 beats per minute)  What is your heart rate? Below 50  Do you have a log of your heart rate readings (document readings)?   Do you have any other symptoms? Pt said she's been having an alert that her HR has been dropping below 50, this morning she got one and while watching a movie at the theater her watch alerted her that her HR dropped below 50 3x. She said her BP is 134/82 and her HR is at 54 now. She is a little concern. She also ask about her heart monitor result

## 2020-10-06 ENCOUNTER — Ambulatory Visit: Payer: PPO | Attending: Family Medicine

## 2020-10-06 ENCOUNTER — Other Ambulatory Visit: Payer: Self-pay

## 2020-10-06 DIAGNOSIS — M542 Cervicalgia: Secondary | ICD-10-CM

## 2020-10-06 DIAGNOSIS — R42 Dizziness and giddiness: Secondary | ICD-10-CM | POA: Insufficient documentation

## 2020-10-06 NOTE — Telephone Encounter (Signed)
Monitor shows HR as low as 42, but generally higher - there were several episodes of SVT.  Not clear if either causing her dizziness. Dr. Loletha Grayer will formally read the monitor next week and can advise the next steps - but nothing dangerous during the monitoring period.  Dr Lemmie Evens

## 2020-10-06 NOTE — Therapy (Signed)
[facility] [address] [place], [place], [address] Phone: [phone]   Fax:  [phone]  Physical Therapy Evaluation  Patient Details  Name: Leslie [name] MRN: [id number] Date of Birth: [birth year] Referring Provider (PT): Sela Hilding, MD   Encounter Date: 10/06/2020   PT End of Session - 10/06/20 1057     Visit Number 1    Number of Visits 9    Date for PT Re-Evaluation 12/01/20    Authorization Type [employer] (10th Visit PN)    Progress Note Due on Visit 10    PT Start Time 1058    PT Stop Time 1145    PT Time Calculation (min) 47 min    Activity Tolerance Patient tolerated treatment well    Behavior During Therapy [facility] for tasks assessed/performed             Past Medical History:  Diagnosis Date   Adenomatous polyp    Arthritis    "knees, legs, fingers" (07/30/2016)   Asthma    Bursitis of left shoulder    "just finished PT" (07/30/2016)   Chest pain    a. 2003 Abnl stress test-->Cath: nonobs CAD.   Chronic bronchitis (HCC)    Chronic kidney disease    stage 3   GERD (gastroesophageal reflux disease)    barrett's esophagus- Dr. Cristina Gong   History of hiatal hernia    Hyperlipidemia    Hypertension    Hypothyroidism    Migraines    "sporatic; at least a few/year" (07/30/2016)   Mitral regurgitation    a. 07/2016 Echo: mild to mod MR;  b. 07/2016 TEE: mild MR.   Moderate aortic insufficiency    a. 07/2016 Echo: EF 55-60%, mod AI;  b. 07/2016 TEE: EF 50-55%, mod AI.   Obesity    s/p lap band surgery 6/10   OSA on CPAP    PAF (paroxysmal atrial fibrillation) ([place])    a. 07/2016 TEE/DCCV: EF 50-55%, mild MR, mod AI, mild to mod TR, neg bubble study-->successful DCCV x1 w/ 120J.   PONV (postoperative nausea and vomiting)    Vitamin D deficiency     Past Surgical History:  Procedure Laterality Date   APPENDECTOMY     incidental   CARDIOVERSION N/A 08/01/2016   Procedure:  CARDIOVERSION;  Surgeon: Jerline Pain, MD;  Location: [facility] ENDOSCOPY;  Service: Cardiovascular;  Laterality: N/A;   CARDIOVERSION N/A 06/24/2019   Procedure: CARDIOVERSION;  Surgeon: Sanda Klein, MD;  Location: [place];  Service: Cardiovascular;  Laterality: N/A;   CARDIOVERSION N/A 05/30/2020   Procedure: CARDIOVERSION;  Surgeon: Sanda Klein, MD;  Location: [facility] ENDOSCOPY;  Service: Cardiovascular;  Laterality: N/A;   CARDIOVERSION N/A 05/09/2020   Procedure: CARDIOVERSION;  Surgeon: [name], MD;  Location: [facility];  Service: Cardiovascular;  Laterality: N/A;   CATARACT EXTRACTION W/ INTRAOCULAR LENS  IMPLANT, BILATERAL Bilateral ~ 2008-2017   left - right   COMBINED HYSTERECTOMY VAGINAL / OOPHORECTOMY / [facility]  09/2008   TEE WITHOUT CARDIOVERSION N/A 08/01/2016   Procedure: TRANSESOPHAGEAL ECHOCARDIOGRAM (TEE);  Surgeon: Jerline Pain, MD;  Location[facility] ENDOSCOPY;  Service: Cardiovascular;  Laterality: N/A;   [place]   "w/1 ovary"    There were no vitals filed for this visit.  Subjective Assessment - 10/06/20 1059     Subjective Patient reports on 5/28, she was driving and suddenly got dizzy and felt as she was going to pass out, as well as had a sensation of the room spinning. She was able to pull over and park the car, then went to ED. Was nauseated when this occured.  Patient reports that her watch has notified that her HR has dropped, approx 45-50 when it drops. Patient has reached out to cardiologist, waiting to hear back. Patient reports that she does have some mild spinning sensation when she looks down and back up, but is very brief. Denies vision/hearing changes. No tinnitus. Reports that she does have history of migraines with aura. Reports she does have some dizziness with  migraines.    Pertinent History Arthritis, Asthma, Anemia, Chronic Bronchitis, CKD Stage 3, GERD, HLD, HTN, Migraines, Hypothyroidism, Obesity, A-Fib, Vitamin D Deficiency    Limitations Standing;Walking    Patient Stated Goals ensure its not vertigo; address neck pain    Currently in Pain? --   reports intermittent generalized pain               [facility] PT Assessment - 10/06/20 1057       Assessment   Medical Diagnosis Dizziness    Referring Provider (PT) Sela Hilding, MD    Onset Date/Surgical Date 09/26/20      Precautions   Precautions Other (comment)    Precaution Comments Arthritis, Asthma, Anemia, Chronic Bronchitis, CKD Stage 3, GERD, HLD, HTN, Migraines, Hypothyroidism, Obesity, A-Fib, Vitamin D Deficiency      Balance Screen   Has the patient fallen in the past 6 months No    Has the patient had a decrease in activity level because of a fear of falling?  No    Is the patient reluctant to leave their home because of a fear of falling?  No      Home Environment   Living Environment Private residence    Living Arrangements Alone    Available Help at Discharge Family    Type of [place] to enter    Entrance Stairs-Number of Steps 3-4    Home Layout One level    [place] None      Prior Function   Level of [place] Retired      Associate Professor   Overall Cognitive Status Within Functional Limits for tasks assessed      Observation/Other Assessments   Focus on Therapeutic Outcomes (FOTO)  DPS: 60.4, DFS: 58      Sensation   Light Touch Appears Intact      Posture/Postural Control   Posture/Postural Control Postural limitations    Postural Limitations Rounded Shoulders;Forward head      ROM / Strength   AROM / PROM / Strength AROM      AROM   Overall AROM  Deficits    Overall AROM Comments decreased cervical AROM, especially noted w/ L Rotation. Pain reported with neck rotation. formal measurements  to be assessed      Palpation   Palpation comment Increased muscle tension/tenderness to palpation noted, L > R, specifically posterio cervical paraspinals, Upper Trap, and suboccippitals.      Transfers   Transfers Stand to Sit;Sit to Stand    Sit to Stand 7: Independent    Stand to Sit 7: Independent      Ambulation/Gait   Ambulation/Gait Yes    Ambulation/Gait  Assistance 7: Independent    Ambulation Distance (Feet) --   clinic distance   Assistive device None    Gait Pattern Wide base of support    Ambulation Surface Level;Indoor                    Vestibular Assessment - 10/06/20 0001       Symptom Behavior   Subjective history of current problem see subjective    Type of Dizziness  Lightheadedness;Vertigo;Spinning    Frequency of Dizziness not sure    Duration of Dizziness seconds    Symptom Nature Motion provoked;Intermittent    Aggravating Factors Looking up to the ceiling;Forward bending    Relieving Factors Slow movements    Progression of Symptoms Better    History of similar episodes reports prior history of vertigo      Oculomotor Exam   Oculomotor Alignment Abnormal   patient reports no muscle activiation in L eye since birth   Ocular ROM WNL for R Eye    Spontaneous Absent    Gaze-induced  Absent    Smooth Pursuits Intact    Saccades Intact    Comment all testing completed with R Eye      Oculomotor Exam-Fixation Suppressed    Left Head Impulse Positive    Right Head Impulse Positive      Vestibulo-Ocular Reflex   VOR 1 Head Only (x 1 viewing) Mild Dizziness, Difficult maintaining focus    VOR Cancellation Normal      Positional Testing   Dix-Hallpike Dix-Hallpike Right;Dix-Hallpike Left    Horizontal Canal Testing Horizontal Canal Right;Horizontal Canal Left      Dix-Hallpike Right   Dix-Hallpike Right Duration 0    Dix-Hallpike Right Symptoms No nystagmus      Dix-Hallpike Left   Dix-Hallpike Left Duration 0    Dix-Hallpike Left  Symptoms No nystagmus      Horizontal Canal Right   Horizontal Canal Right Duration 0    Horizontal Canal Right Symptoms Normal      Horizontal Canal Left   Horizontal Canal Left Duration 0    Horizontal Canal Left Symptoms Normal      Positional Sensitivities   Sit to Supine No dizziness    Supine to Left Side No dizziness    Supine to Right Side No dizziness    Supine to Sitting No dizziness    Right Hallpike No dizziness    Up from Right Hallpike No dizziness    Up from Left Hallpike No dizziness    Rolling Right No dizziness    Rolling Left No dizziness      Orthostatics   BP supine (x 5 minutes) 148/79    HR supine (x 5 minutes) 63    BP sitting 179/83   mild lightheaded sensation   HR sitting 61    BP standing (after 1 minute) 171/90    HR standing (after 1 minute) 65    Orthostatics Comment required rest break to allow BP to return to baseline, 151/80.                Objective measurements completed on examination: See above findings.               PT Education - 10/06/20 1104     Education Details Educated on [employer]) Educated Patient    Methods Explanation    Comprehension Verbalized understanding  PT Short Term Goals - 10/06/20 1251       PT SHORT TERM GOAL #1   Title Patient will be independent with initial vestibular/ROM/balance HEP (All STGs Due: 11/03/2020)    Baseline no HEP established    Time 4    Period Weeks    Status New    Target Date 11/03/20      PT SHORT TERM GOAL #2   Title DVA/Cervical ROM to be assessed and LTG to be set as applicable    Baseline TBA    Time 4    Period Weeks    Status New               PT Long Term Goals - 10/06/20 1252       PT LONG TERM GOAL #1   Title Patient will be independent with final vestibular/ROM/balance HEP (ALL LTGS Due: 12/01/2020)    Baseline no HEP established    Time 8    Period Weeks    Status New    Target Date  12/01/20      PT LONG TERM GOAL #2   Title LTG to be set for Cervical ROM    Baseline TBA    Time 8    Period Weeks    Status New      PT LONG TERM GOAL #3   Title LTG to be set for DVA    Baseline TBA    Time 8    Period Weeks    Status New      PT LONG TERM GOAL #4   Title Patient will report 50% improvement in pain/dizziness with neck movements to allow for improved activity tolerance    Time 8    Period Weeks    Status New      PT LONG TERM GOAL #5   Title Patient will improve DFS >/= 61, and DPS >/= 65    Baseline DFS: 58. DPS: 60.4    Time 8    Period Weeks    Status New                    Plan - 10/06/20 1244     Clinical Impression Statement Patient is a 75 y.o. female referred to Neuro OPPT services for Dizziness. Patient's PMH significant for the following: Arthritis, Asthma, Anemia, Chronic Bronchitis, CKD Stage 3, GERD, HLD, HTN, Migraines, Hypothyroidism, Obesity, A-Fib, Vitamin D Deficiency. Patient presents with the following impairments: dizziness, abnormal oculomotor exam, positive bilateral HIT indicating impaired VOR, decreased AROM cervical, cervicalgia, and increased mucle tension in cervical region. Negative positional testing today at session. Patient also reports history of migraines with aura and associated dizziness. Differential diagnosis of vestibular migraine vs. cervicalagia. Patient will benefit from skilled PT services to address impairments, and improved tolerance for functional activities.    Personal Factors and Comorbidities Comorbidity 3+    Comorbidities Arthritis, Asthma, Anemia, Chronic Bronchitis, CKD Stage 3, GERD, HLD, HTN, Migraines, Hypothyroidism, Obesity, A-Fib, Vitamin D Deficiency    Examination-Activity Limitations Locomotion Level;Bend    Examination-Participation Restrictions Driving;Community Activity    Stability/Clinical Decision Making Stable/Uncomplicated    Clinical Decision Making Low    Rehab Potential Good     PT Frequency 1x / week    PT Duration 8 weeks    PT Treatment/Interventions ADLs/Self Care Home Management;Canalith Repostioning;Cryotherapy;Electrical Stimulation;Moist Heat;Traction;DME Instruction;Gait training;Stair training;Functional mobility training;Therapeutic activities;Therapeutic exercise;Balance training;Neuromuscular re-education;Patient/family education;Manual techniques;Passive range of motion;Dry needling;Vestibular;Joint Manipulations;Spinal Manipulations    PT Next  Visit Plan Assess DVA and Cervical ROM. Potential for Dry Needling. Initaite HEP focused on VOR x 1 and Cervical ROM    Consulted and Agree with Plan of Care Patient             Patient will benefit from skilled therapeutic intervention in order to improve the following deficits and impairments:  Dizziness, Decreased range of motion, Decreased activity tolerance, Impaired flexibility, Increased muscle spasms, Pain, Postural dysfunction, Decreased balance  Visit Diagnosis: Dizziness and giddiness  Cervicalgia     Problem List Patient Active Problem List   Diagnosis Date Noted   Anemia 06/21/2020   Leukocytosis 06/21/2020   Acute CHF ([place]) 05/31/2020   Acute CHF (congestive heart failure) ([place]) 05/30/2020   OSA (obstructive sleep apnea) 11/16/2016   Chronic anticoagulation 09/17/2016   Mitral valve insufficiency    Aortic valve regurgitation    Persistent atrial fibrillation ([place]) 07/30/2016   Essential hypertension, benign 06/07/2013   Unspecified vitamin D deficiency 06/07/2013   Pure hypercholesterolemia 06/07/2013   Morbid obesity ([place]) 06/07/2013   Hypothyroidism 06/07/2013   Chronic kidney disease, stage III (moderate) ([place]) 06/07/2013   Contact dermatitis and other eczema, due to unspecified cause 06/07/2013    Jones Bales, PT, DPT 10/06/2020, 12:59 PM  [facility] [address] [place], [place], [address] Phone:  [phone]   Fax:  [phone]  Name: Leslie [name] MRN: [id number] Date of Birth: [birth year]

## 2020-10-06 NOTE — Telephone Encounter (Signed)
Called patient, LVM advising of message from covering MD.  Left call back number.

## 2020-10-09 DIAGNOSIS — R3129 Other microscopic hematuria: Secondary | ICD-10-CM | POA: Diagnosis not present

## 2020-10-11 ENCOUNTER — Telehealth: Payer: Self-pay | Admitting: Cardiovascular Disease

## 2020-10-11 DIAGNOSIS — I4819 Other persistent atrial fibrillation: Secondary | ICD-10-CM

## 2020-10-11 MED ORDER — VERAPAMIL HCL ER 180 MG PO CP24: 180.0000 mg | ORAL_CAPSULE | Freq: Every day | ORAL | 3 refills | Status: DC

## 2020-10-11 NOTE — Telephone Encounter (Signed)
Spoke with patient and reviewed Dr. Victorino December advice with her. She is concerned about stopping verapamil completely due to its effectiveness against her migraines. She asked if we can reduce the dose to 180 mg daily and let Dr. Sallyanne Kuster know the reason she doesn't want to stop it. I verified that she has extended release capsules at home and she will continue to take 180 mg daily. I advised I will forward message to Dr. Sallyanne Kuster for further advisement. She verbalized understanding and agreement and thanked me for the call.

## 2020-10-11 NOTE — Telephone Encounter (Signed)
Returned call to patient who states she is getting alerts from her Apple watch that her HR is < 50 bpm on multiple occasions each day. She reports dizziness on occasions that lasts several seconds. She underwent an assessment for vertigo on 6/24 and was advised that she had minimal evidence of vertigo but that they could proceed with treatment with hope of some improvement. Patient states that she wants Dr. Loletha Grayer to have all of this information in addition to the monitor report because she had an episode of near syncope on 5/28 and she now wonders if the low HR is contributing. She denies hypotension; reports systolic BP has not been <160 mmHg to her knowledge. She denies feelings of fast or irregular HR. I advised that I will forward message to Dr. Sallyanne Kuster for his awareness and for advice and that someone from our office will call her back after he has reviewed. She verbalized understanding and agreement and thanked me for the call.

## 2020-10-11 NOTE — Telephone Encounter (Signed)
    Pt saw her heart monitor results and Dr. Lurline Del notes on her mychart. She also wanted to tell Dr. Loletha Grayer for a week and a half her apple watch been giving her notification that she has slow HR several times a day

## 2020-10-11 NOTE — Telephone Encounter (Signed)
Leslie [name] states she is returning a call from [place] she had just missed. Please advise.

## 2020-10-12 ENCOUNTER — Other Ambulatory Visit: Payer: Self-pay

## 2020-10-12 ENCOUNTER — Telehealth: Payer: Self-pay | Admitting: Cardiovascular Disease

## 2020-10-12 MED ORDER — VERAPAMIL HCL ER 180 MG PO TBCR
180.0000 mg | EXTENDED_RELEASE_TABLET | Freq: Every day | ORAL | 3 refills | Status: DC
Start: 2020-10-12 — End: 2021-10-05

## 2020-10-12 NOTE — Telephone Encounter (Signed)
Tablet sent to pharmacy.

## 2020-10-12 NOTE — Telephone Encounter (Signed)
Pt c/o medication issue:  1. Name of Medication: verapamil (VERELAN PM) 180 MG 24 hr capsule  2. How are you currently taking this medication (dosage and times per day)?  Patient has the tablets now and taking 2 pills daily   3. Are you having a reaction (difficulty breathing--STAT)?   4. What is your medication issue? Patient needs the rx to be written so the tablets can be dispensed. The patient can not take the capsules due to the size   Please send updated rx to :  [facility] [place], [address] AT [address]

## 2020-10-12 NOTE — Telephone Encounter (Signed)
Patient has been made aware and will call back if anything is needed.

## 2020-10-13 ENCOUNTER — Other Ambulatory Visit: Payer: Self-pay

## 2020-10-13 ENCOUNTER — Ambulatory Visit: Payer: PPO | Attending: Family Medicine | Admitting: Physical Therapy

## 2020-10-13 DIAGNOSIS — R42 Dizziness and giddiness: Secondary | ICD-10-CM | POA: Diagnosis not present

## 2020-10-13 DIAGNOSIS — M542 Cervicalgia: Secondary | ICD-10-CM | POA: Diagnosis not present

## 2020-10-13 NOTE — Therapy (Signed)
[facility] [address] [place], [place], [address] Phone: [phone]   Fax:  [phone]  Physical Therapy Treatment  Patient Details  Name: Leslie [name] MRN: [id number] Date of Birth: 05-19-1945 Referring Provider (PT): Sela Hilding, MD   Encounter Date: 10/13/2020   PT End of Session - 10/13/20 0809     Visit Number 2    Number of Visits 9    Date for PT Re-Evaluation 12/01/20    Authorization Type [employer] (10th Visit PN)    Progress Note Due on Visit 10    PT Start Time 0807    PT Stop Time 0853    PT Time Calculation (min) 46 min    Equipment Utilized During Treatment Other (comment)   dry needles   Activity Tolerance Patient tolerated treatment well    Behavior During Therapy [facility] for tasks assessed/performed             Past Medical History:  Diagnosis Date   Adenomatous polyp    Arthritis    "knees, legs, fingers" (07/30/2016)   Asthma    Bursitis of left shoulder    "just finished PT" (07/30/2016)   Chest pain    a. 2003 Abnl stress test-->Cath: nonobs CAD.   Chronic bronchitis (HCC)    Chronic kidney disease    stage 3   GERD (gastroesophageal reflux disease)    barrett's esophagus- Dr. Cristina Gong   History of hiatal hernia    Hyperlipidemia    Hypertension    Hypothyroidism    Migraines    "sporatic; at least a few/year" (07/30/2016)   Mitral regurgitation    a. 07/2016 Echo: mild to mod MR;  b. 07/2016 TEE: mild MR.   Moderate aortic insufficiency    a. 07/2016 Echo: EF 55-60%, mod AI;  b. 07/2016 TEE: EF 50-55%, mod AI.   Obesity    s/p lap band surgery 6/10   OSA on CPAP    PAF (paroxysmal atrial fibrillation) ([place])    a. 07/2016 TEE/DCCV: EF 50-55%, mild MR, mod AI, mild to mod TR, neg bubble study-->successful DCCV x1 w/ 120J.   PONV (postoperative nausea and vomiting)    Vitamin D deficiency     Past Surgical History:  Procedure Laterality Date   APPENDECTOMY      incidental   CARDIOVERSION N/A 08/01/2016   Procedure: CARDIOVERSION;  Surgeon: Jerline Pain, MD;  Location: [facility] ENDOSCOPY;  Service: Cardiovascular;  Laterality: N/A;   CARDIOVERSION N/A 06/24/2019   Procedure: CARDIOVERSION;  Surgeon: Sanda Klein, MD;  Location: [facility];  Service: Cardiovascular;  Laterality: N/A;   CARDIOVERSION N/A 05/30/2020   Procedure: CARDIOVERSION;  Surgeon: Sanda Klein, MD;  Location: [facility] ENDOSCOPY;  Service: Cardiovascular;  Laterality: N/A;   CARDIOVERSION N/A 05/09/2020   Procedure: CARDIOVERSION;  Surgeon: [name], MD;  Location: [facility];  Service: Cardiovascular;  Laterality: N/A;   CATARACT EXTRACTION W/ INTRAOCULAR LENS  IMPLANT, BILATERAL Bilateral ~ 2008-2017   left - right   COMBINED HYSTERECTOMY VAGINAL / OOPHORECTOMY / A&P [place]  09/2008   TEE WITHOUT CARDIOVERSION N/A 08/01/2016   Procedure: TRANSESOPHAGEAL ECHOCARDIOGRAM (TEE);  Surgeon: Jerline Pain, MD;  Location: [facility];  Service: Cardiovascular;  Laterality: N/A;   [place]   "w/1 ovary"  There were no vitals filed for this visit.   Subjective Assessment - 10/13/20 0811     Subjective Dizziness has been pretty good since eval.  Neck is still very stiff.  No questions after eval.    Pertinent History Arthritis, Asthma, Anemia, Chronic Bronchitis, CKD Stage 3, GERD, HLD, HTN, Migraines, Hypothyroidism, Obesity, A-Fib, Vitamin D Deficiency    Limitations Standing;Walking    Patient Stated Goals ensure its not vertigo; address neck pain    Currently in Pain? No/denies                [facility] PT Assessment - 10/13/20 0813       ROM / Strength   AROM / PROM / Strength AROM      AROM   Overall AROM  Deficits    AROM Assessment Site Cervical    Cervical Flexion 50    Cervical  Extension 20   pain on L side   Cervical - Right Side Bend 20    Cervical - Left Side Bend 10   pain and pt tipping body to the L side   Cervical - Right Rotation 25   painful   Cervical - Left Rotation 35   pain                Vestibular Assessment - 10/13/20 0818       Visual Acuity   Static Deferred due to pain and ROM limitations                      [facility] Adult PT Treatment/Exercise - 10/13/20 1156       Therapeutic Activites    Therapeutic Activities Other Therapeutic Activities    Other Therapeutic Activities Reviewed Dry needling handout, purpose and methodology of dry needling, screened pt for any contraindications or precautions - pt is on Eliquis - discussed risk for bruising and use of ice if needed; discussed other side effects and ways to mitigate including hydration and stretches.  Performed skilled palpation on L side locating trigger points in upper trap, infraspinatus, pectoralis and SCM.  Addressed [place] only today.      Exercises   Exercises Other Exercises    Other Exercises  Reviewed how to perform upper trap stretch and flexion/extension ROM today to help mitigate soreness.      Manual Therapy   Manual Therapy Joint mobilization;Manual Traction    Joint Mobilization Performed after dry needling; performed grade 1/2 upglides and downglides to transverse processes along upper, middle and lower cervical spine to improve ROM for rotation    Manual Traction performed after dry needling x 2 minutes to cervical spine              Trigger Point Dry Needling - 10/13/20 1156     Consent Given? Yes    Education Handout Provided Yes    Muscles Treated Head and Neck Upper trapezius    Dry Needling Comments Performed in supine to L side only    Upper Trapezius Response Twitch reponse elicited;Palpable increased muscle length                  PT Education - 10/13/20 1151     Education Details Trigger point dry needling handout,  demonstrated exercises but will provide handout next session    Person(s) Educated Patient    Methods Explanation;Demonstration    Comprehension Verbalized understanding;Returned demonstration              PT Short  Term Goals - 10/06/20 1251       PT SHORT TERM GOAL #1   Title Patient will be independent with initial vestibular/ROM/balance HEP (All STGs Due: 11/03/2020)    Baseline no HEP established    Time 4    Period Weeks    Status New    Target Date 11/03/20      PT SHORT TERM GOAL #2   Title DVA/Cervical ROM to be assessed and LTG to be set as applicable    Baseline TBA    Time 4    Period Weeks    Status New               PT Long Term Goals - 10/13/20 1143       PT LONG TERM GOAL #1   Title Patient will be independent with final vestibular/ROM/balance HEP (ALL LTGS Due: 12/01/2020)    Baseline no HEP established    Time 8    Period Weeks    Status New      PT LONG TERM GOAL #2   Title Pt will increase cervical spine extension, lateral flexion and rotation by 10-15 degrees to improve ROM for driving and vestibular treatment    Baseline see flow sheets    Time 8    Period Weeks    Status Revised      PT LONG TERM GOAL #3   Title DVA goal deferred at this time due to significant ROM limitations and pain    Status Deferred      PT LONG TERM GOAL #4   Title Patient will report 50% improvement in pain/dizziness with neck movements to allow for improved activity tolerance    Time 8    Period Weeks    Status New      PT LONG TERM GOAL #5   Title Patient will improve DFS >/= 61, and DPS >/= 65    Baseline DFS: 58. DPS: 60.4    Time 8    Period Weeks    Status New                   Plan - 10/13/20 1147     Clinical Impression Statement Performed assessment of patient's cervical spine AROM with pt demonstrating significant limitations in cervical extension, lateral flexion to L and R and rotation to L and R.  Educated pt on trigger point  dry needling.  Utilized dry needling and light manual therapy to begin to address cervical spine limitations and pain.  Pt tolerated well and would like to continue to address.    Personal Factors and Comorbidities Comorbidity 3+    Comorbidities Arthritis, Asthma, Anemia, Chronic Bronchitis, CKD Stage 3, GERD, HLD, HTN, Migraines, Hypothyroidism, Obesity, A-Fib, Vitamin D Deficiency    Examination-Activity Limitations Locomotion Level;Bend    Examination-Participation Restrictions Driving;Community Activity    Stability/Clinical Decision Making Stable/Uncomplicated    Rehab Potential Good    PT Frequency 1x / week    PT Duration 8 weeks    PT Treatment/Interventions ADLs/Self Care Home Management;Canalith Repostioning;Cryotherapy;Electrical Stimulation;Moist Heat;Traction;DME Instruction;Gait training;Stair training;Functional mobility training;Therapeutic activities;Therapeutic exercise;Balance training;Neuromuscular re-education;Patient/family education;Manual techniques;Passive range of motion;Dry needling;Vestibular;Joint Manipulations;Spinal Manipulations    PT Next Visit Plan I couldn't assess DVA due to pain and ROM limitations.  I switched her to me on 7/15 for another dry needling appointment.  Still needs HEP focused on VOR x 1 and Cervical ROM    Consulted and Agree with Plan of Care Patient  Patient will benefit from skilled therapeutic intervention in order to improve the following deficits and impairments:  Dizziness, Decreased range of motion, Decreased activity tolerance, Impaired flexibility, Increased muscle spasms, Pain, Postural dysfunction, Decreased balance  Visit Diagnosis: Dizziness and giddiness  Cervicalgia     Problem List Patient Active Problem List   Diagnosis Date Noted   Anemia 06/21/2020   Leukocytosis 06/21/2020   Acute CHF ([place]) 05/31/2020   Acute CHF (congestive heart failure) ([place]) 05/30/2020   OSA (obstructive sleep apnea)  11/16/2016   Chronic anticoagulation 09/17/2016   Mitral valve insufficiency    Aortic valve regurgitation    Persistent atrial fibrillation ([place]) 07/30/2016   Essential hypertension, benign 06/07/2013   Unspecified vitamin D deficiency 06/07/2013   Pure hypercholesterolemia 06/07/2013   Morbid obesity ([place]) 06/07/2013   Hypothyroidism 06/07/2013   Chronic kidney disease, stage III (moderate) ([place]) 06/07/2013   Contact dermatitis and other eczema, due to unspecified cause 06/07/2013    Rico Junker, PT, DPT 10/13/20    12:05 PM    [facility] [address] [place], [place], [address] Phone: [phone]   Fax:  [phone]  Name: NEIDY GUERRIERI MRN: [id number] Date of Birth: 05/02/1945

## 2020-10-13 NOTE — Patient Instructions (Addendum)
Trigger Point Dry Needling  What is Trigger Point Dry Needling (DN)? DN is a physical therapy technique used to treat muscle pain and dysfunction. Specifically, DN helps deactivate muscle trigger points (muscle knots).  A thin filiform needle is used to penetrate the skin and stimulate the underlying trigger point. The goal is for a local twitch response (LTR) to occur and for the trigger point to relax. No medication of any kind is injected during the procedure.   What Does Trigger Point Dry Needling Feel Like?  The procedure feels different for each individual patient. Some patients report that they do not actually feel the needle enter the skin and overall the process is not painful. Very mild bleeding may occur. However, many patients feel a deep cramping in the muscle in which the needle was inserted. This is the local twitch response.   How Will I feel after the treatment? Soreness is normal, and the onset of soreness may not occur for a few hours. Typically this soreness does not last longer than two days.  Bruising is uncommon, however; ice can be used to decrease any possible bruising.  In rare cases feeling tired or nauseous after the treatment is normal. In addition, your symptoms may get worse before they get better, this period will typically not last longer than 24 hours.   What Can I do After My Treatment? Increase your hydration by drinking more water for the next 24 hours. You may place ice or heat on the areas treated that have become sore, however, do not use heat on inflamed or bruised areas. Heat often brings more relief post needling. You can continue your regular activities, but vigorous activity is not recommended initially after the treatment for 24 hours. DN is best combined with other physical therapy such as strengthening, stretching, and other therapies    Flexibility: Upper Trapezius Stretch    Look down.  Gently grasp LEFT side of head with right hand while  reaching LEFT hand down. Tilt head towards the RIGHT side until a gentle stretch is felt. Hold __20__ seconds. Repeat __2__ times per set. Do __2__ sessions per day.    Neck Flexion / Extension - Arms at Sides    SIT comfortably with arms at sides, flex then extend neck GENTLY, moving head forward and back but do not force into pain. Perform 10 repetitions gently.

## 2020-10-20 ENCOUNTER — Other Ambulatory Visit: Payer: Self-pay

## 2020-10-20 ENCOUNTER — Ambulatory Visit: Payer: PPO

## 2020-10-20 DIAGNOSIS — R42 Dizziness and giddiness: Secondary | ICD-10-CM | POA: Diagnosis not present

## 2020-10-20 DIAGNOSIS — M542 Cervicalgia: Secondary | ICD-10-CM

## 2020-10-20 NOTE — Therapy (Signed)
[facility] [address] [place], [place], [address] Phone: [phone]   Fax:  [phone]  Physical Therapy Treatment  Patient Details  Name: Leslie [name] MRN: [id number] Date of Birth: 1945-06-25 Referring Provider (PT): Sela Hilding, MD   Encounter Date: 10/20/2020   PT End of Session - 10/20/20 1447     Visit Number 3    Number of Visits 9    Date for PT Re-Evaluation 12/01/20    Authorization Type [employer] (10th Visit PN)    Progress Note Due on Visit 10    PT Start Time 1447    PT Stop Time 1529    PT Time Calculation (min) 42 min    Equipment Utilized During Treatment Other (comment)   dry needles   Activity Tolerance Patient tolerated treatment well    Behavior During Therapy [facility] for tasks assessed/performed             Past Medical History:  Diagnosis Date   Adenomatous polyp    Arthritis    "knees, legs, fingers" (07/30/2016)   Asthma    Bursitis of left shoulder    "just finished PT" (07/30/2016)   Chest pain    a. 2003 Abnl stress test-->Cath: nonobs CAD.   Chronic bronchitis (HCC)    Chronic kidney disease    stage 3   GERD (gastroesophageal reflux disease)    barrett's esophagus- Dr. Cristina Gong   History of hiatal hernia    Hyperlipidemia    Hypertension    Hypothyroidism    Migraines    "sporatic; at least a few/year" (07/30/2016)   Mitral regurgitation    a. 07/2016 Echo: mild to mod MR;  b. 07/2016 TEE: mild MR.   Moderate aortic insufficiency    a. 07/2016 Echo: EF 55-60%, mod AI;  b. 07/2016 TEE: EF 50-55%, mod AI.   Obesity    s/p lap band surgery 6/10   OSA on CPAP    PAF (paroxysmal atrial fibrillation) ([place])    a. 07/2016 TEE/DCCV: EF 50-55%, mild MR, mod AI, mild to mod TR, neg bubble study-->successful DCCV x1 w/ 120J.   PONV (postoperative nausea and vomiting)    Vitamin D deficiency     Past Surgical History:  Procedure Laterality Date   APPENDECTOMY      incidental   CARDIOVERSION N/A 08/01/2016   Procedure: CARDIOVERSION;  Surgeon: Jerline Pain, MD;  Location: [facility] ENDOSCOPY;  Service: Cardiovascular;  Laterality: N/A;   CARDIOVERSION N/A 06/24/2019   Procedure: CARDIOVERSION;  Surgeon: Sanda Klein, MD;  Location: [facility];  Service: Cardiovascular;  Laterality: N/A;   CARDIOVERSION N/A 05/30/2020   Procedure: CARDIOVERSION;  Surgeon: Sanda Klein, MD;  Location: [facility] ENDOSCOPY;  Service: Cardiovascular;  Laterality: N/A;   CARDIOVERSION N/A 05/09/2020   Procedure: CARDIOVERSION;  Surgeon: [name], MD;  Location: [facility];  Service: Cardiovascular;  Laterality: N/A;   CATARACT EXTRACTION W/ INTRAOCULAR LENS  IMPLANT, BILATERAL Bilateral ~ 2008-2017   left - right   COMBINED HYSTERECTOMY VAGINAL / OOPHORECTOMY / A&P [place]  09/2008   TEE WITHOUT CARDIOVERSION N/A 08/01/2016   Procedure: TRANSESOPHAGEAL ECHOCARDIOGRAM (TEE);  Surgeon: Jerline Pain, MD;  Location: [facility];  Service: Cardiovascular;  Laterality: N/A;   [place]   "w/1 ovary"  There were no vitals filed for this visit.   Subjective Assessment - 10/20/20 1449     Subjective Patient reports that the Dry Needling helped, reports that the pain was reduced and the stiffness felt better. Patient reports the dizziness has been happening when looking down to eat/read.    Pertinent History Arthritis, Asthma, Anemia, Chronic Bronchitis, CKD Stage 3, GERD, HLD, HTN, Migraines, Hypothyroidism, Obesity, A-Fib, Vitamin D Deficiency    Limitations Standing;Walking    Patient Stated Goals ensure its not vertigo; address neck pain    Currently in Pain? Yes    Pain Score 6     Pain Location Neck    Pain Orientation Posterior;Lower    Pain Descriptors / Indicators Aching    Pain Type  Chronic pain                   [facility] Adult PT Treatment/Exercise - 10/20/20 0001       Exercises   Exercises Other Exercises    Other Exercises  Attempted assisted cervical rotation with towel to bilat directions, x 2 reps each with 15 second hold. PT educaitng on technique for proper completion. Patient reporting mild radiating pain/discomfort with completion, therefore withheld from HEP at this time. Completed gentle cervical rotation to R/L x 10 reps, w/ PT stabilizing shoulder to isolate neck rotation. Then with second set x 5 reps, PT providing gentle overpressure at end range. With patient seated, shoulder in shrugged position. PT educating and guiding patient through deep breathing, with allowing for relaxation with exhale to promote reduced tension.      Manual Therapy   Manual Therapy Soft tissue mobilization    Soft tissue mobilization Completed STM to L and R Upper Trap and Levator Scap. Intermittent tenderness with palpable trigger points noted iwthin L Upper Trap, therefore followed iwth TPR to area.             Vestibular Treatment/Exercise - 10/20/20 0001       Vestibular Treatment/Exercise   Vestibular Treatment Provided Gaze    Gaze Exercises X1 Viewing Horizontal;X1 Viewing Vertical      X1 Viewing Horizontal   Foot Position seated    Reps 2    Comments x 30 seconds, use of R Eye only. mild bluriness. PT providing cues for proper form, attempt to keep trunk stationary and isolate neck movement      X1 Viewing Vertical   Foot Position seated    Reps 2    Comments x 30 seconds, use of R Eye only.              Gaze Stabilization: Sitting    Cover left eye, and use right eye only. Keeping eye focused on target on wall 3-4 feet away, tilt head down 15-30 and move head side to side for 30 seconds. Repeat while moving head up and down for 30 seconds. Do 2 sessions per day.     PT Education - 10/20/20 1618     Education Details VOR x 1     Person(s) Educated Patient    Methods Demonstration;Explanation;Handout    Comprehension Verbalized understanding;Returned demonstration              PT Short Term Goals - 10/13/20 1204       PT SHORT TERM GOAL #1   Title Patient will be independent with initial vestibular/ROM/balance HEP (All STGs Due: 11/03/2020)    Baseline no HEP established    Time 4    Period  Weeks    Status On-going    Target Date 11/03/20      PT SHORT TERM GOAL #2   Title DVA/Cervical ROM to be assessed and LTG to be set as applicable    Status Achieved               PT Long Term Goals - 10/13/20 1143       PT LONG TERM GOAL #1   Title Patient will be independent with final vestibular/ROM/balance HEP (ALL LTGS Due: 12/01/2020)    Baseline no HEP established    Time 8    Period Weeks    Status New      PT LONG TERM GOAL #2   Title Pt will increase cervical spine extension, lateral flexion and rotation by 10-15 degrees to improve ROM for driving and vestibular treatment    Baseline see flow sheets    Time 8    Period Weeks    Status Revised      PT LONG TERM GOAL #3   Title DVA goal deferred at this time due to significant ROM limitations and pain    Status Deferred      PT LONG TERM GOAL #4   Title Patient will report 50% improvement in pain/dizziness with neck movements to allow for improved activity tolerance    Time 8    Period Weeks    Status New      PT LONG TERM GOAL #5   Title Patient will improve DFS >/= 61, and DPS >/= 65    Baseline DFS: 58. DPS: 60.4    Time 8    Period Weeks    Status New                   Plan - 10/20/20 1619     Clinical Impression Statement Continued manual therapy and ther ex to cervical spine to promote improved ROM and reductions in muscle tension. patient responding well to TPR and Dry Needling that was performed at prior session. Initiated VOR x 1 utilizing the right eye only for completion, added to HEP. Will continue to address  deficits and progress toward all goals.    Personal Factors and Comorbidities Comorbidity 3+    Comorbidities Arthritis, Asthma, Anemia, Chronic Bronchitis, CKD Stage 3, GERD, HLD, HTN, Migraines, Hypothyroidism, Obesity, A-Fib, Vitamin D Deficiency    Examination-Activity Limitations Locomotion Level;Bend    Examination-Participation Restrictions Driving;Community Activity    Stability/Clinical Decision Making Stable/Uncomplicated    Rehab Potential Good    PT Frequency 1x / week    PT Duration 8 weeks    PT Treatment/Interventions ADLs/Self Care Home Management;Canalith Repostioning;Cryotherapy;Electrical Stimulation;Moist Heat;Traction;DME Instruction;Gait training;Stair training;Functional mobility training;Therapeutic activities;Therapeutic exercise;Balance training;Neuromuscular re-education;Patient/family education;Manual techniques;Passive range of motion;Dry needling;Vestibular;Joint Manipulations;Spinal Manipulations    PT Next Visit Plan Review VOR x 1, continue to add exercises for Cervical ROM. Continue dry needling and manual therapy.    Consulted and Agree with Plan of Care Patient             Patient will benefit from skilled therapeutic intervention in order to improve the following deficits and impairments:  Dizziness, Decreased range of motion, Decreased activity tolerance, Impaired flexibility, Increased muscle spasms, Pain, Postural dysfunction, Decreased balance  Visit Diagnosis: Cervicalgia  Dizziness and giddiness     Problem List Patient Active Problem List   Diagnosis Date Noted   Anemia 06/21/2020   Leukocytosis 06/21/2020   Acute CHF ([place]) 05/31/2020   Acute CHF (congestive  heart failure) ([place]) 05/30/2020   OSA (obstructive sleep apnea) 11/16/2016   Chronic anticoagulation 09/17/2016   Mitral valve insufficiency    Aortic valve regurgitation    Persistent atrial fibrillation ([place]) 07/30/2016   Essential hypertension, benign 06/07/2013    Unspecified vitamin D deficiency 06/07/2013   Pure hypercholesterolemia 06/07/2013   Morbid obesity ([place]) 06/07/2013   Hypothyroidism 06/07/2013   Chronic kidney disease, stage III (moderate) ([place]) 06/07/2013   Contact dermatitis and other eczema, due to unspecified cause 06/07/2013    Jones Bales, PT, DPT 10/20/2020, 4:23 PM  [facility] [address] [place], [place], [address] Phone: [phone]   Fax:  [phone]  Name: Leslie [name] MRN: [id number] Date of Birth: 1945/08/07

## 2020-10-20 NOTE — Patient Instructions (Signed)
Gaze Stabilization: Sitting    Cover left eye, and use right eye only. Keeping eye focused on target on wall 3-4 feet away, tilt head down 15-30 and move head side to side for 30 seconds. Repeat while moving head up and down for 30 seconds. Do 2 sessions per day.  Copyright  VHI. All rights reserved.

## 2020-10-27 ENCOUNTER — Ambulatory Visit: Payer: PPO | Admitting: Physical Therapy

## 2020-10-27 ENCOUNTER — Other Ambulatory Visit: Payer: Self-pay

## 2020-10-27 DIAGNOSIS — M542 Cervicalgia: Secondary | ICD-10-CM

## 2020-10-27 DIAGNOSIS — R42 Dizziness and giddiness: Secondary | ICD-10-CM | POA: Diagnosis not present

## 2020-10-27 NOTE — Therapy (Signed)
[facility] [address] [place], [place], [address] Phone: [phone]   Fax:  [phone]  Physical Therapy Treatment  Patient Details  Name: Leslie [name] MRN: [id number] Date of Birth: 06/28/1945 Referring Provider (PT): Sela Hilding, MD   Encounter Date: 10/27/2020   PT End of Session - 10/27/20 1104     Visit Number 4    Number of Visits 9    Date for PT Re-Evaluation 12/01/20    Authorization Type [employer] (10th Visit PN)    Progress Note Due on Visit 10    PT Start Time 1104    PT Stop Time 1150    PT Time Calculation (min) 46 min    Equipment Utilized During Treatment Other (comment)   dry needles   Activity Tolerance Patient tolerated treatment well    Behavior During Therapy [facility] for tasks assessed/performed             Past Medical History:  Diagnosis Date   Adenomatous polyp    Arthritis    "knees, legs, fingers" (07/30/2016)   Asthma    Bursitis of left shoulder    "just finished PT" (07/30/2016)   Chest pain    a. 2003 Abnl stress test-->Cath: nonobs CAD.   Chronic bronchitis (HCC)    Chronic kidney disease    stage 3   GERD (gastroesophageal reflux disease)    barrett's esophagus- Dr. Cristina Gong   History of hiatal hernia    Hyperlipidemia    Hypertension    Hypothyroidism    Migraines    "sporatic; at least a few/year" (07/30/2016)   Mitral regurgitation    a. 07/2016 Echo: mild to mod MR;  b. 07/2016 TEE: mild MR.   Moderate aortic insufficiency    a. 07/2016 Echo: EF 55-60%, mod AI;  b. 07/2016 TEE: EF 50-55%, mod AI.   Obesity    s/p lap band surgery 6/10   OSA on CPAP    PAF (paroxysmal atrial fibrillation) ([place])    a. 07/2016 TEE/DCCV: EF 50-55%, mild MR, mod AI, mild to mod TR, neg bubble study-->successful DCCV x1 w/ 120J.   PONV (postoperative nausea and vomiting)    Vitamin D deficiency     Past Surgical History:  Procedure Laterality Date   APPENDECTOMY      incidental   CARDIOVERSION N/A 08/01/2016   Procedure: CARDIOVERSION;  Surgeon: Jerline Pain, MD;  Location: [facility] ENDOSCOPY;  Service: Cardiovascular;  Laterality: N/A;   CARDIOVERSION N/A 06/24/2019   Procedure: CARDIOVERSION;  Surgeon: Sanda Klein, MD;  Location: [facility];  Service: Cardiovascular;  Laterality: N/A;   CARDIOVERSION N/A 05/30/2020   Procedure: CARDIOVERSION;  Surgeon: Sanda Klein, MD;  Location: [facility] ENDOSCOPY;  Service: Cardiovascular;  Laterality: N/A;   CARDIOVERSION N/A 05/09/2020   Procedure: CARDIOVERSION;  Surgeon: [name], MD;  Location: [facility];  Service: Cardiovascular;  Laterality: N/A;   CATARACT EXTRACTION W/ INTRAOCULAR LENS  IMPLANT, BILATERAL Bilateral ~ 2008-2017   left - right   COMBINED HYSTERECTOMY VAGINAL / OOPHORECTOMY / A&P [place]  09/2008   TEE WITHOUT CARDIOVERSION N/A 08/01/2016   Procedure: TRANSESOPHAGEAL ECHOCARDIOGRAM (TEE);  Surgeon: Jerline Pain, MD;  Location: [facility];  Service: Cardiovascular;  Laterality: N/A;   [place]   "w/1 ovary"  There were no vitals filed for this visit.   Subjective Assessment - 10/27/20 1106     Subjective DN helped but still using Voltaren.  Pain and stiffness are better but not completely gone.  Still having mild dizziness with eye exercises and when looking down.    Pertinent History Arthritis, Asthma, Anemia, Chronic Bronchitis, CKD Stage 3, GERD, HLD, HTN, Migraines, Hypothyroidism, Obesity, A-Fib, Vitamin D Deficiency    Limitations Standing;Walking    Patient Stated Goals ensure its not vertigo; address neck pain    Currently in Pain? Yes    Pain Score 4     Pain Location Neck    Pain Descriptors / Indicators Tightness;Discomfort                [facility] Adult PT Treatment/Exercise -  10/27/20 1203       Modalities   Modalities Cryotherapy      Cryotherapy   Number Minutes Cryotherapy 8 Minutes    Cryotherapy Location Shoulder;Cervical    Type of Cryotherapy Ice massage      Manual Therapy   Manual Therapy Soft tissue mobilization    Soft tissue mobilization Performed STM to L upper trap along upper and mid fibers after performing Trigger point dry needling              Trigger Point Dry Needling - 10/27/20 1108     Consent Given? Yes    Education Handout Provided Previously provided    Muscles Treated Head and Neck Upper trapezius;Suboccipitals    Dry Needling Comments performed in prone to L side only    Upper Trapezius Response Twitch reponse elicited;Palpable increased muscle length    Suboccipitals Response Palpable increased muscle length                  PT Education - 10/27/20 1206     Education Details will benefit from one more dry needling session    Person(s) Educated Patient    Methods Explanation    Comprehension Verbalized understanding              PT Short Term Goals - 10/13/20 1204       PT SHORT TERM GOAL #1   Title Patient will be independent with initial vestibular/ROM/balance HEP (All STGs Due: 11/03/2020)    Baseline no HEP established    Time 4    Period Weeks    Status On-going    Target Date 11/03/20      PT SHORT TERM GOAL #2   Title DVA/Cervical ROM to be assessed and LTG to be set as applicable    Status Achieved               PT Long Term Goals - 10/13/20 1143       PT LONG TERM GOAL #1   Title Patient will be independent with final vestibular/ROM/balance HEP (ALL LTGS Due: 12/01/2020)    Baseline no HEP established    Time 8    Period Weeks    Status New      PT LONG TERM GOAL #2   Title Pt will increase cervical spine extension, lateral flexion and rotation by 10-15 degrees to improve ROM for driving and vestibular treatment    Baseline see flow sheets    Time 8    Period Weeks     Status Revised      PT LONG TERM GOAL #3   Title DVA goal deferred at this time due to significant ROM  limitations and pain    Status Deferred      PT LONG TERM GOAL #4   Title Patient will report 50% improvement in pain/dizziness with neck movements to allow for improved activity tolerance    Time 8    Period Weeks    Status New      PT LONG TERM GOAL #5   Title Patient will improve DFS >/= 61, and DPS >/= 65    Baseline DFS: 58. DPS: 60.4    Time 8    Period Weeks    Status New                   Plan - 10/27/20 1208     Clinical Impression Statement Continued to address neck pain and impairments in active ROM with use of trigger point dry needling and STM on L side.  Pt tolerated well with no symptoms of dizziness after performing.  Pt demonstrated greater AROM on L side compared to R after performing TDN and manual therapy.  Will likely benefit from one more dry needling session.    Personal Factors and Comorbidities Comorbidity 3+    Comorbidities Arthritis, Asthma, Anemia, Chronic Bronchitis, CKD Stage 3, GERD, HLD, HTN, Migraines, Hypothyroidism, Obesity, A-Fib, Vitamin D Deficiency    Examination-Activity Limitations Locomotion Level;Bend    Examination-Participation Restrictions Driving;Community Activity    Stability/Clinical Decision Making Stable/Uncomplicated    Rehab Potential Good    PT Frequency 1x / week    PT Duration 8 weeks    PT Treatment/Interventions ADLs/Self Care Home Management;Canalith Repostioning;Cryotherapy;Electrical Stimulation;Moist Heat;Traction;DME Instruction;Gait training;Stair training;Functional mobility training;Therapeutic activities;Therapeutic exercise;Balance training;Neuromuscular re-education;Patient/family education;Manual techniques;Passive range of motion;Dry needling;Vestibular;Joint Manipulations;Spinal Manipulations    PT Next Visit Plan Check STG.  Continue manual therapy to cervical spine to increase rotation; postural  exercises.  Review VOR x 1, continue to add exercises for Cervical ROM. I will do one more dry needling session on 8/29.    Consulted and Agree with Plan of Care Patient             Patient will benefit from skilled therapeutic intervention in order to improve the following deficits and impairments:  Dizziness, Decreased range of motion, Decreased activity tolerance, Impaired flexibility, Increased muscle spasms, Pain, Postural dysfunction, Decreased balance  Visit Diagnosis: Cervicalgia  Dizziness and giddiness     Problem List Patient Active Problem List   Diagnosis Date Noted   Anemia 06/21/2020   Leukocytosis 06/21/2020   Acute CHF ([place]) 05/31/2020   Acute CHF (congestive heart failure) ([place]) 05/30/2020   OSA (obstructive sleep apnea) 11/16/2016   Chronic anticoagulation 09/17/2016   Mitral valve insufficiency    Aortic valve regurgitation    Persistent atrial fibrillation ([place]) 07/30/2016   Essential hypertension, benign 06/07/2013   Unspecified vitamin D deficiency 06/07/2013   Pure hypercholesterolemia 06/07/2013   Morbid obesity ([place]) 06/07/2013   Hypothyroidism 06/07/2013   Chronic kidney disease, stage III (moderate) ([place]) 06/07/2013   Contact dermatitis and other eczema, due to unspecified cause 06/07/2013   Rico Junker, PT, DPT 10/27/20    12:12 PM    [facility] [address] [place], [place], [address] Phone: [phone]   Fax:  [phone]  Name: ROXANNE PANEK MRN: [id number] Date of Birth: March 29, 1946

## 2020-11-03 ENCOUNTER — Ambulatory Visit: Payer: PPO

## 2020-11-03 ENCOUNTER — Telehealth: Payer: Self-pay | Admitting: Cardiovascular Disease

## 2020-11-03 NOTE — Telephone Encounter (Signed)
    Pt c/o BP issue: STAT if pt c/o blurred vision, one-sided weakness or slurred speech  1. What are your last 5 BP readings?  8 am BP 186/104 HR 73 2nd reading BP 181/99 HR 66 most recent BP 196/104 HR 77   2. Are you having any other symptoms (ex. Dizziness, headache, blurred vision, passed out)?   3. What is your BP issue? Pt said she woke up not feeling good she checked her BP and this is her reading this morning, she said she checked her apple watch and she is not in Afib but she said she feels really bad

## 2020-11-03 NOTE — Telephone Encounter (Signed)
Spoke with pt and B/P is elevated Per pt did not sleep well last night whileon phone with pt B/P was 195/92 and HR was 76 Per pt notes SOB weith activity Pt in past was on Avapro 150 mg but this was stopped back in May due to dizziness and migraines  Pt felt dehydrated and has drank 3 glasses of water and Also had [place] food nigh before last and leftovers yesterday Will forward to Dr [employer] for review and recommendations ./cy

## 2020-11-03 NOTE — Telephone Encounter (Signed)
Pt aware of recommendations and agrees with plan ./cy 

## 2020-11-10 ENCOUNTER — Ambulatory Visit: Payer: PPO | Admitting: Physical Therapy

## 2020-11-10 ENCOUNTER — Other Ambulatory Visit: Payer: Self-pay

## 2020-11-10 DIAGNOSIS — R42 Dizziness and giddiness: Secondary | ICD-10-CM | POA: Diagnosis not present

## 2020-11-10 DIAGNOSIS — M542 Cervicalgia: Secondary | ICD-10-CM

## 2020-11-10 NOTE — Therapy (Signed)
[facility] [address] [place], [place], [address] Phone: [phone]   Fax:  [phone]  Physical Therapy Treatment  Patient Details  Name: Leslie [name] MRN: [id number] Date of Birth: [birth year] Referring Provider (PT): Sela Hilding, MD   Encounter Date: 11/10/2020   PT End of Session - 11/10/20 1118     Visit Number 5    Number of Visits 9    Date for PT Re-Evaluation 12/01/20    Authorization Type [employer] (10th Visit PN)    Progress Note Due on Visit 10    PT Start Time 1016    PT Stop Time 1105    PT Time Calculation (min) 49 min    Equipment Utilized During Treatment Other (comment)   dry needles   Activity Tolerance Patient tolerated treatment well    Behavior During Therapy WFL for tasks assessed/performed             Past Medical History:  Diagnosis Date   Adenomatous polyp    Arthritis    "knees, legs, fingers" (07/30/2016)   Asthma    Bursitis of left shoulder    "just finished PT" (07/30/2016)   Chest pain    a. 2003 Abnl stress test-->Cath: nonobs CAD.   Chronic bronchitis (HCC)    Chronic kidney disease    stage 3   GERD (gastroesophageal reflux disease)    barrett's esophagus- Dr. Cristina Gong   History of hiatal hernia    Hyperlipidemia    Hypertension    Hypothyroidism    Migraines    "sporatic; at least a few/year" (07/30/2016)   Mitral regurgitation    a. 07/2016 Echo: mild to mod MR;  b. 07/2016 TEE: mild MR.   Moderate aortic insufficiency    a. 07/2016 Echo: EF 55-60%, mod AI;  b. 07/2016 TEE: EF 50-55%, mod AI.   Obesity    s/p lap band surgery 6/10   OSA on CPAP    PAF (paroxysmal atrial fibrillation) ([place])    a. 07/2016 TEE/DCCV: EF 50-55%, mild MR, mod AI, mild to mod TR, neg bubble study-->successful DCCV x1 w/ 120J.   PONV (postoperative nausea and vomiting)    Vitamin D deficiency     Past Surgical History:  Procedure Laterality Date   APPENDECTOMY      incidental   CARDIOVERSION N/A 08/01/2016   Procedure: CARDIOVERSION;  Surgeon: Jerline Pain, MD;  Location: [facility] ENDOSCOPY;  Service: Cardiovascular;  Laterality: N/A;   CARDIOVERSION N/A 06/24/2019   Procedure: CARDIOVERSION;  Surgeon: Sanda Klein, MD;  Location: [facility];  Service: Cardiovascular;  Laterality: N/A;   CARDIOVERSION N/A 05/30/2020   Procedure: CARDIOVERSION;  Surgeon: Sanda Klein, MD;  Location: [facility] ENDOSCOPY;  Service: Cardiovascular;  Laterality: N/A;   CARDIOVERSION N/A 05/09/2020   Procedure: CARDIOVERSION;  Surgeon: [name], MD;  Location: [facility];  Service: Cardiovascular;  Laterality: N/A;   CATARACT EXTRACTION W/ INTRAOCULAR LENS  IMPLANT, BILATERAL Bilateral ~ 2008-2017   left - right   COMBINED HYSTERECTOMY VAGINAL / OOPHORECTOMY / A&P [place]  09/2008   TEE WITHOUT CARDIOVERSION N/A 08/01/2016   Procedure: TRANSESOPHAGEAL ECHOCARDIOGRAM (TEE);  Surgeon: Jerline Pain, MD;  Location: [facility];  Service: Cardiovascular;  Laterality: N/A;   [place]   "w/1 ovary"  There were no vitals filed for this visit.   Subjective Assessment - 11/10/20 1023     Subjective Pt reports having migraine aura on the way to therapy today.  Reports it typically only affects her speech and it should be gone by end of session.  Doesn't think dry needling or exercises will make it worse.  Neck is feeling a lot better; had a moment yesterday where she forgot she had neck pain.  Dizziness is much better.  Couldn't come last week because BP was too high; discussed with cardiologist.    Pertinent History Arthritis, Asthma, Anemia, Chronic Bronchitis, CKD Stage 3, GERD, HLD, HTN, Migraines, Hypothyroidism, Obesity, A-Fib, Vitamin D Deficiency    Limitations Standing;Walking    Patient  Stated Goals ensure its not vertigo; address neck pain    Currently in Pain? Yes    Pain Score 2     Pain Location Neck    Pain Descriptors / Indicators Tightness    Pain Type Chronic pain                [facility] PT Assessment - 11/10/20 1103       ROM / Strength   AROM / PROM / Strength AROM      AROM   Overall AROM  Deficits    AROM Assessment Site Cervical    Cervical Flexion 60    Cervical Extension 30    Cervical - Right Side Bend 30    Cervical - Left Side Bend 30    Cervical - Right Rotation 30    Cervical - Left Rotation 35                 [facility] Adult PT Treatment/Exercise - 11/10/20 1118       Modalities   Modalities Cryotherapy      Cryotherapy   Number Minutes Cryotherapy 10 Minutes    Cryotherapy Location Shoulder;Cervical    Type of Cryotherapy Ice massage              Trigger Point Dry Needling - 11/10/20 1102     Consent Given? Yes    Education Handout Provided Previously provided    Muscles Treated Head and Neck Upper trapezius    Dry Needling Comments Performed in prone to R and L side today, increased time spent on L side    Upper Trapezius Response Twitch reponse elicited;Palpable increased muscle length                  PT Education - 11/10/20 1123     Education Details Conclusion of dry needling sessions; will continue with manual therapy and vestibular exercises as needed at next session; discuss improvement in ROM    Person(s) Educated Patient    Methods Explanation    Comprehension Verbalized understanding              PT Short Term Goals - 11/10/20 1119       PT SHORT TERM GOAL #1   Title Patient will be independent with initial vestibular/ROM/balance HEP (All STGs Due: 11/03/2020)    Time 4    Period Weeks    Status Achieved    Target Date 11/03/20      PT SHORT TERM GOAL #2   Title DVA/Cervical ROM to be assessed and LTG to be set as applicable    Status Achieved               PT Long  Term Goals - 10/13/20 1143  PT LONG TERM GOAL #1   Title Patient will be independent with final vestibular/ROM/balance HEP (ALL LTGS Due: 12/01/2020)    Baseline no HEP established    Time 8    Period Weeks    Status New      PT LONG TERM GOAL #2   Title Pt will increase cervical spine extension, lateral flexion and rotation by 10-15 degrees to improve ROM for driving and vestibular treatment    Baseline see flow sheets    Time 8    Period Weeks    Status Revised      PT LONG TERM GOAL #3   Title DVA goal deferred at this time due to significant ROM limitations and pain    Status Deferred      PT LONG TERM GOAL #4   Title Patient will report 50% improvement in pain/dizziness with neck movements to allow for improved activity tolerance    Time 8    Period Weeks    Status New      PT LONG TERM GOAL #5   Title Patient will improve DFS >/= 61, and DPS >/= 65    Baseline DFS: 58. DPS: 60.4    Time 8    Period Weeks    Status New                   Plan - 11/10/20 1119     Clinical Impression Statement Performed one final session with trigger point dry needling to improve cervical spine ROM, especially for rotation and to decrease pain.  Performed to both R and L side today but still with increased attention to L side.  Utilized ice massage to minimize side effects of dry needling.  Following third dry needling session pt demonstrated significant improvements in cervical ROM in all directions and demonstrates more symmetrical movement L and R.  No increase in migraine HA after dry needling.  No reports of dizziness.    Personal Factors and Comorbidities Comorbidity 3+    Comorbidities Arthritis, Asthma, Anemia, Chronic Bronchitis, CKD Stage 3, GERD, HLD, HTN, Migraines, Hypothyroidism, Obesity, A-Fib, Vitamin D Deficiency    Examination-Activity Limitations Locomotion Level;Bend    Examination-Participation Restrictions Driving;Community Activity    Stability/Clinical  Decision Making Stable/Uncomplicated    Rehab Potential Good    PT Frequency 1x / week    PT Duration 8 weeks    PT Treatment/Interventions ADLs/Self Care Home Management;Canalith Repostioning;Cryotherapy;Electrical Stimulation;Moist Heat;Traction;DME Instruction;Gait training;Stair training;Functional mobility training;Therapeutic activities;Therapeutic exercise;Balance training;Neuromuscular re-education;Patient/family education;Manual techniques;Passive range of motion;Dry needling;Vestibular;Joint Manipulations;Spinal Manipulations    PT Next Visit Plan No further DN.  Deep neck flexor strength. Continue manual therapy to cervical spine to increase rotation; postural exercises.  Review VOR x 1, continue to add exercises for Cervical ROM.    Consulted and Agree with Plan of Care Patient             Patient will benefit from skilled therapeutic intervention in order to improve the following deficits and impairments:  Dizziness, Decreased range of motion, Decreased activity tolerance, Impaired flexibility, Increased muscle spasms, Pain, Postural dysfunction, Decreased balance  Visit Diagnosis: Cervicalgia  Dizziness and giddiness     Problem List Patient Active Problem List   Diagnosis Date Noted   Anemia 06/21/2020   Leukocytosis 06/21/2020   Acute CHF ([place]) 05/31/2020   Acute CHF (congestive heart failure) ([place]) 05/30/2020   OSA (obstructive sleep apnea) 11/16/2016   Chronic anticoagulation 09/17/2016   Mitral valve insufficiency  Aortic valve regurgitation    Persistent atrial fibrillation ([place]) 07/30/2016   Essential hypertension, benign 06/07/2013   Unspecified vitamin D deficiency 06/07/2013   Pure hypercholesterolemia 06/07/2013   Morbid obesity ([place]) 06/07/2013   Hypothyroidism 06/07/2013   Chronic kidney disease, stage III (moderate) ([place]) 06/07/2013   Contact dermatitis and other eczema, due to unspecified cause 06/07/2013    Rico Junker, PT, DPT 11/10/20     11:25 AM    [facility] [address] [place], [place], [address] Phone: [phone]   Fax:  [phone]  Name: DLORAH COSSAIRT MRN: [id number] Date of Birth: 09-Dec-1945

## 2020-11-13 DIAGNOSIS — M7662 Achilles tendinitis, left leg: Secondary | ICD-10-CM | POA: Diagnosis not present

## 2020-11-13 DIAGNOSIS — M1711 Unilateral primary osteoarthritis, right knee: Secondary | ICD-10-CM | POA: Diagnosis not present

## 2020-11-13 DIAGNOSIS — M170 Bilateral primary osteoarthritis of knee: Secondary | ICD-10-CM | POA: Diagnosis not present

## 2020-11-13 DIAGNOSIS — M1712 Unilateral primary osteoarthritis, left knee: Secondary | ICD-10-CM | POA: Diagnosis not present

## 2020-11-17 ENCOUNTER — Ambulatory Visit: Payer: PPO | Attending: Family Medicine | Admitting: Physical Therapy

## 2020-11-17 ENCOUNTER — Other Ambulatory Visit: Payer: Self-pay

## 2020-11-17 DIAGNOSIS — R42 Dizziness and giddiness: Secondary | ICD-10-CM | POA: Diagnosis not present

## 2020-11-17 DIAGNOSIS — M542 Cervicalgia: Secondary | ICD-10-CM | POA: Diagnosis not present

## 2020-11-17 NOTE — Therapy (Signed)
[facility] [address] [place], [place], [address] Phone: [phone]   Fax:  [phone]  Physical Therapy Treatment  Patient Details  Name: Leslie [name] MRN: [id number] Date of Birth: September 24, 1945 Referring Provider (PT): Sela Hilding, MD   Encounter Date: 11/17/2020   PT End of Session - 11/17/20 1153     Visit Number 6    Number of Visits 9    Date for PT Re-Evaluation 12/01/20    Authorization Type [employer] (10th Visit PN)    Progress Note Due on Visit 10    PT Start Time 1150    PT Stop Time 1230    PT Time Calculation (min) 40 min    Activity Tolerance Patient tolerated treatment well    Behavior During Therapy [facility] for tasks assessed/performed             Past Medical History:  Diagnosis Date   Adenomatous polyp    Arthritis    "knees, legs, fingers" (07/30/2016)   Asthma    Bursitis of left shoulder    "just finished PT" (07/30/2016)   Chest pain    a. 2003 Abnl stress test-->Cath: nonobs CAD.   Chronic bronchitis (HCC)    Chronic kidney disease    stage 3   GERD (gastroesophageal reflux disease)    barrett's esophagus- Dr. Cristina Gong   History of hiatal hernia    Hyperlipidemia    Hypertension    Hypothyroidism    Migraines    "sporatic; at least a few/year" (07/30/2016)   Mitral regurgitation    a. 07/2016 Echo: mild to mod MR;  b. 07/2016 TEE: mild MR.   Moderate aortic insufficiency    a. 07/2016 Echo: EF 55-60%, mod AI;  b. 07/2016 TEE: EF 50-55%, mod AI.   Obesity    s/p lap band surgery 6/10   OSA on CPAP    PAF (paroxysmal atrial fibrillation) ([place])    a. 07/2016 TEE/DCCV: EF 50-55%, mild MR, mod AI, mild to mod TR, neg bubble study-->successful DCCV x1 w/ 120J.   PONV (postoperative nausea and vomiting)    Vitamin D deficiency     Past Surgical History:  Procedure Laterality Date   APPENDECTOMY     incidental   CARDIOVERSION N/A 08/01/2016   Procedure: CARDIOVERSION;   Surgeon: Jerline Pain, MD;  Location: [facility] ENDOSCOPY;  Service: Cardiovascular;  Laterality: N/A;   CARDIOVERSION N/A 06/24/2019   Procedure: CARDIOVERSION;  Surgeon: Sanda Klein, MD;  Location: [place];  Service: Cardiovascular;  Laterality: N/A;   CARDIOVERSION N/A 05/30/2020   Procedure: CARDIOVERSION;  Surgeon: Sanda Klein, MD;  Location: [facility] ENDOSCOPY;  Service: Cardiovascular;  Laterality: N/A;   CARDIOVERSION N/A 05/09/2020   Procedure: CARDIOVERSION;  Surgeon: [name], MD;  Location: [place];  Service: Cardiovascular;  Laterality: N/A;   CATARACT EXTRACTION W/ INTRAOCULAR LENS  IMPLANT, BILATERAL Bilateral ~ 2008-2017   left - right   COMBINED HYSTERECTOMY VAGINAL / OOPHORECTOMY / A&P [place]  09/2008   TEE WITHOUT CARDIOVERSION N/A 08/01/2016   Procedure: TRANSESOPHAGEAL ECHOCARDIOGRAM (TEE);  Surgeon: Jerline Pain, MD;  Location: [facility] ENDOSCOPY;  Service: Cardiovascular;  Laterality: N/A;   [place]   "w/1 ovary"    There were no vitals filed for this visit.  Subjective Assessment - 11/17/20 1153     Subjective Had migraines Saturday and Sunday but better this week.  Had injection in L achilles, foot feels much better and rest of body feels better.  Neck feels better and hands feel better.  Has better ROM today.    Pertinent History Arthritis, Asthma, Anemia, Chronic Bronchitis, CKD Stage 3, GERD, HLD, HTN, Migraines, Hypothyroidism, Obesity, A-Fib, Vitamin D Deficiency    Limitations Standing;Walking    Patient Stated Goals ensure its not vertigo; address neck pain    Currently in Pain? No/denies            Reviewed the following exercises with patient in supine for HEP.  Therapist monitored patient technique and mm activation and provided feedback for deep neck flexor  training and isometric strengthening.  Provided handout and tennis balls to patient for home.  Access Code: [facility] URL: [email or web address] Date: 11/17/2020 Prepared by: Misty Stanley  Exercises Supine Deep Neck Flexor Training on Blood Pressure Cuff - 1 x daily - 7 x weekly - 2 sets - 5 reps - 5 second hold Supine Isometric Neck Sidebend (Lateral Flexion as an alternative name) - 1 x daily - 7 x weekly - 1 sets - 10 reps - 4 second hold Supine Isometric Neck Rotation - 1 x daily - 7 x weekly - 1 sets - 10 reps - 4 second hold Supine Suboccipital Release with Tennis Balls - 1 x daily - 7 x weekly - 2 min hold     PT Education - 11/17/20 1534     Education Details updated HEP, plan for final two visits    Person(s) Educated Patient    Methods Explanation;Demonstration;Other (comment)    Comprehension Verbalized understanding;Returned demonstration              PT Short Term Goals - 11/10/20 1119       PT SHORT TERM GOAL #1   Title Patient will be independent with initial vestibular/ROM/balance HEP (All STGs Due: 11/03/2020)    Time 4    Period Weeks    Status Achieved    Target Date 11/03/20      PT SHORT TERM GOAL #2   Title DVA/Cervical ROM to be assessed and LTG to be set as applicable    Status Achieved               PT Long Term Goals - 10/13/20 1143       PT LONG TERM GOAL #1   Title Patient will be independent with final vestibular/ROM/balance HEP (ALL LTGS Due: 12/01/2020)    Baseline no HEP established    Time 8    Period Weeks    Status New      PT LONG TERM GOAL #2   Title Pt will increase cervical spine extension, lateral flexion and rotation by 10-15 degrees to improve ROM for driving and vestibular treatment    Baseline see flow sheets    Time 8    Period Weeks    Status Revised      PT LONG TERM GOAL #3   Title DVA goal deferred at this time due to significant ROM limitations and pain    Status Deferred      PT LONG  TERM GOAL #4   Title Patient will report 50% improvement in pain/dizziness with neck movements to allow for improved activity tolerance    Time 8    Period Weeks    Status New  PT LONG TERM GOAL #5   Title Patient will improve DFS >/= 61, and DPS >/= 65    Baseline DFS: 58. DPS: 60.4    Time 8    Period Weeks    Status New                   Plan - 11/17/20 1530     Clinical Impression Statement Treatment session focused on updating HEP to focus on maintaining gains made with neck ROM and decreased pain.  Focused on deep neck flexor strengthening, isometric strengthening and self soft tissue mobilization with tennis balls in suboccipital space.  Pt tolerated well with no dizziness.  Will begin to assess progress towards LTG next session and decide if pt would benefit from one further visit or is ready for D/C.    Personal Factors and Comorbidities Comorbidity 3+    Comorbidities Arthritis, Asthma, Anemia, Chronic Bronchitis, CKD Stage 3, GERD, HLD, HTN, Migraines, Hypothyroidism, Obesity, A-Fib, Vitamin D Deficiency    Examination-Activity Limitations Locomotion Level;Bend    Examination-Participation Restrictions Driving;Community Activity    Stability/Clinical Decision Making Stable/Uncomplicated    Rehab Potential Good    PT Frequency 1x / week    PT Duration 8 weeks    PT Treatment/Interventions ADLs/Self Care Home Management;Canalith Repostioning;Cryotherapy;Electrical Stimulation;Moist Heat;Traction;DME Instruction;Gait training;Stair training;Functional mobility training;Therapeutic activities;Therapeutic exercise;Balance training;Neuromuscular re-education;Patient/family education;Manual techniques;Passive range of motion;Dry needling;Vestibular;Joint Manipulations;Spinal Manipulations    PT Next Visit Plan How is she doing with neck HEP?  Need to add anything else to it?  Check LTG.  Benefit from one further dry needling before D/C?  If so we can try to switch patients  on last visit.    Consulted and Agree with Plan of Care Patient             Patient will benefit from skilled therapeutic intervention in order to improve the following deficits and impairments:  Dizziness, Decreased range of motion, Decreased activity tolerance, Impaired flexibility, Increased muscle spasms, Pain, Postural dysfunction, Decreased balance  Visit Diagnosis: Cervicalgia  Dizziness and giddiness     Problem List Patient Active Problem List   Diagnosis Date Noted   Anemia 06/21/2020   Leukocytosis 06/21/2020   Acute CHF ([place]) 05/31/2020   Acute CHF (congestive heart failure) ([place]) 05/30/2020   OSA (obstructive sleep apnea) 11/16/2016   Chronic anticoagulation 09/17/2016   Mitral valve insufficiency    Aortic valve regurgitation    Persistent atrial fibrillation ([place]) 07/30/2016   Essential hypertension, benign 06/07/2013   Unspecified vitamin D deficiency 06/07/2013   Pure hypercholesterolemia 06/07/2013   Morbid obesity ([place]) 06/07/2013   Hypothyroidism 06/07/2013   Chronic kidney disease, stage III (moderate) ([place]) 06/07/2013   Contact dermatitis and other eczema, due to unspecified cause 06/07/2013   Rico Junker, PT, DPT 11/17/20    3:35 PM    [facility] [address] [place], [place], [address] Phone: [phone]   Fax:  [phone]  Name: Leslie [name] MRN: [id number] Date of Birth: [birth year]

## 2020-11-17 NOTE — Patient Instructions (Addendum)
Gaze Stabilization: Sitting    Cover left eye, and use right eye only. Keeping eye focused on target on wall 3-4 feet away, tilt head down 15-30 and move head side to side for 30 seconds. Repeat while moving head up and down for 30 seconds. Do 2 sessions per day.   Access Code: [facility] URL: [email or web address] Date: 11/17/2020 Prepared by: Misty Stanley  Exercises Supine Deep Neck Flexor Training on Blood Pressure Cuff - 1 x daily - 7 x weekly - 2 sets - 5 reps - 5 second hold Supine Isometric Neck Sidebend (Lateral Flexion as an alternative name) - 1 x daily - 7 x weekly - 1 sets - 10 reps - 4 second hold Supine Isometric Neck Rotation - 1 x daily - 7 x weekly - 1 sets - 10 reps - 4 second hold Supine Suboccipital Release with Tennis Balls - 1 x daily - 7 x weekly - 2 min hold

## 2020-11-22 DIAGNOSIS — K625 Hemorrhage of anus and rectum: Secondary | ICD-10-CM | POA: Diagnosis not present

## 2020-11-22 DIAGNOSIS — R195 Other fecal abnormalities: Secondary | ICD-10-CM | POA: Diagnosis not present

## 2020-11-22 DIAGNOSIS — G4733 Obstructive sleep apnea (adult) (pediatric): Secondary | ICD-10-CM | POA: Diagnosis not present

## 2020-11-22 DIAGNOSIS — K5902 Outlet dysfunction constipation: Secondary | ICD-10-CM | POA: Diagnosis not present

## 2020-11-22 DIAGNOSIS — R198 Other specified symptoms and signs involving the digestive system and abdomen: Secondary | ICD-10-CM | POA: Diagnosis not present

## 2020-11-23 ENCOUNTER — Ambulatory Visit: Payer: PPO

## 2020-11-23 ENCOUNTER — Other Ambulatory Visit: Payer: Self-pay

## 2020-11-23 DIAGNOSIS — M542 Cervicalgia: Secondary | ICD-10-CM

## 2020-11-23 DIAGNOSIS — R42 Dizziness and giddiness: Secondary | ICD-10-CM

## 2020-11-23 NOTE — Therapy (Signed)
[place] [address] [place], [place], [address] Phone: [phone]   Fax:  [phone]  Physical Therapy Treatment  Patient Details  Name: Leslie [name] MRN: [id number] Date of Birth: 1945-06-12 Referring Provider (PT): Sela Hilding, MD   Encounter Date: 11/23/2020   PT End of Session - 11/23/20 1021     Visit Number 7    Number of Visits 9    Date for PT Re-Evaluation 12/01/20    Authorization Type [employer] (10th Visit PN)    Progress Note Due on Visit 10    PT Start Time 1018    PT Stop Time 1100    PT Time Calculation (min) 42 min    Activity Tolerance Patient tolerated treatment well    Behavior During Therapy [facility] for tasks assessed/performed             Past Medical History:  Diagnosis Date   Adenomatous polyp    Arthritis    "knees, legs, fingers" (07/30/2016)   Asthma    Bursitis of left shoulder    "just finished PT" (07/30/2016)   Chest pain    a. 2003 Abnl stress test-->Cath: nonobs CAD.   Chronic bronchitis (HCC)    Chronic kidney disease    stage 3   GERD (gastroesophageal reflux disease)    barrett's esophagus- Dr. Cristina Gong   History of hiatal hernia    Hyperlipidemia    Hypertension    Hypothyroidism    Migraines    "sporatic; at least a few/year" (07/30/2016)   Mitral regurgitation    a. 07/2016 Echo: mild to mod MR;  b. 07/2016 TEE: mild MR.   Moderate aortic insufficiency    a. 07/2016 Echo: EF 55-60%, mod AI;  b. 07/2016 TEE: EF 50-55%, mod AI.   Obesity    s/p lap band surgery 6/10   OSA on CPAP    PAF (paroxysmal atrial fibrillation) ([place])    a. 07/2016 TEE/DCCV: EF 50-55%, mild MR, mod AI, mild to mod TR, neg bubble study-->successful DCCV x1 w/ 120J.   PONV (postoperative nausea and vomiting)    Vitamin D deficiency     Past Surgical History:  Procedure Laterality Date   APPENDECTOMY     incidental   CARDIOVERSION N/A 08/01/2016   Procedure: CARDIOVERSION;   Surgeon: Jerline Pain, MD;  Location: [facility] ENDOSCOPY;  Service: Cardiovascular;  Laterality: N/A;   CARDIOVERSION N/A 06/24/2019   Procedure: CARDIOVERSION;  Surgeon: Sanda Klein, MD;  Location: [place];  Service: Cardiovascular;  Laterality: N/A;   CARDIOVERSION N/A 05/30/2020   Procedure: CARDIOVERSION;  Surgeon: Sanda Klein, MD;  Location: [place] ENDOSCOPY;  Service: Cardiovascular;  Laterality: N/A;   CARDIOVERSION N/A 05/09/2020   Procedure: CARDIOVERSION;  Surgeon: [name], MD;  Location: [facility];  Service: Cardiovascular;  Laterality: N/A;   CATARACT EXTRACTION W/ INTRAOCULAR LENS  IMPLANT, BILATERAL Bilateral ~ 2008-2017   left - right   COMBINED HYSTERECTOMY VAGINAL / OOPHORECTOMY / A&P [place]  09/2008   TEE WITHOUT CARDIOVERSION N/A 08/01/2016   Procedure: TRANSESOPHAGEAL ECHOCARDIOGRAM (TEE);  Surgeon: Jerline Pain, MD;  Location: [facility] ENDOSCOPY;  Service: Cardiovascular;  Laterality: N/A;   [place]   "w/1 ovary"    There were no vitals filed for this visit.  Subjective Assessment - 11/23/20 1022     Subjective Patient reports no new changes. Paitent reports that the R side feels slightly tight.    Pertinent History Arthritis, Asthma, Anemia, Chronic Bronchitis, CKD Stage 3, GERD, HLD, HTN, Migraines, Hypothyroidism, Obesity, A-Fib, Vitamin D Deficiency    Limitations Standing;Walking    Patient Stated Goals ensure its not vertigo; address neck pain    Currently in Pain? Yes    Pain Score 4     Pain Location Neck    Pain Orientation Left;Posterior;Lower    Pain Descriptors / Indicators Tightness    Pain Type Chronic pain            Reviewed the follow HEP:   Access Code: [facility] URL: [email or web address] Date: 11/23/2020 Prepared by: Baldomero Lamy   Exercises Supine Deep Neck Flexor Training on Blood Pressure Cuff - 1 x daily - 7 x weekly - 2 sets - 5 reps - 5 second hold Supine Isometric Neck Sidebend (Lateral Flexion as an alternative name) - 1 x daily - 7 x weekly - 1 sets - 10 reps - 4 second hold Supine Isometric Neck Rotation - 1 x daily - 7 x weekly - 1 sets - 10 reps - 4 second hold Supine Suboccipital Release with Tennis Balls - 1 x daily - 7 x weekly - 2 min hold Seated Scapular Retraction - 1 x daily - 7 x weekly - 1 sets - 10 reps    [facility] PT Assessment - 11/23/20 0001       Observation/Other Assessments   Focus on Therapeutic Outcomes (FOTO)  DFS: 58, DPS: 65.5.              [facility] Adult PT Treatment/Exercise - 11/23/20 0001       Exercises   Exercises Neck      Neck Exercises: Theraband   Scapula Retraction 10 reps;Limitations    Scapula Retraction Limitations no resistance. Completed inhale, upon exhale relaxation of shoulders to avoid shrruging, and then completing scapular retraction x 10 reps. Cues for proper completion initially, added to HEP and provided handout.      Manual Therapy   Manual Therapy Soft tissue mobilization    Soft tissue mobilization Performed STM to B Upper Trap in Supine Position. TPR to trigger points noted. Completed manual stretching to B Upper Trap. Completed contract relax to B UT 3 x 5-10 second hold each, progressing into range as tolerated by patient. Also completed TPR to suboccipitals.               PT Education - 11/23/20 1059     Education Details progress toward LTG; HEP Update; rescheduled last visit for DN    Person(s) Educated Patient    Methods Explanation    Comprehension Verbalized understanding              PT Short Term Goals - 11/10/20 1119       PT SHORT TERM GOAL #1   Title Patient will be independent with initial vestibular/ROM/balance HEP (All STGs Due: 11/03/2020)    Time 4    Period Weeks    Status Achieved    Target Date  11/03/20      PT SHORT TERM GOAL #2   Title DVA/Cervical ROM to be assessed and LTG to be set as applicable    Status Achieved               PT Long Term Goals - 11/23/20 1028  PT LONG TERM GOAL #1   Title Patient will be independent with final vestibular/ROM/balance HEP (ALL LTGS Due: 12/01/2020)    Baseline no HEP established; reports independence with HEP    Time 8    Period Weeks    Status Achieved      PT LONG TERM GOAL #2   Title Pt will increase cervical spine extension, lateral flexion and rotation by 10-15 degrees to improve ROM for driving and vestibular treatment    Baseline see flow sheets    Time 8    Period Weeks    Status Revised      PT LONG TERM GOAL #3   Title DVA goal deferred at this time due to significant ROM limitations and pain    Status Deferred      PT LONG TERM GOAL #4   Title Patient will report 50% improvement in pain/dizziness with neck movements to allow for improved activity tolerance    Baseline patient reports 45% - 50% improvement    Time 8    Period Weeks    Status Achieved      PT LONG TERM GOAL #5   Title Patient will improve DFS >/= 61, and DPS >/= 65    Baseline DFS: 58. DPS: 60.4; DFS: 58, DPS: 65.5    Time 8    Period Weeks    Status Partially Met                   Plan - 11/23/20 1233     Clinical Impression Statement Began assesment of patient's progress toward LTG. Patietn able to meet/partially meeting LTG #1, 4 and 5. Patient reporting improvements in dizziness and pain in neck, but continuese to have stiffness/tightness limiting cervical ROM. Continued manual therapy and therapeutic exercise focused on improved ROM and promoting proper recruitment pattern with scapular retraction as patient often wanting to compensate with shoulder elevation. Will continue to progress toward all LTGs.    Personal Factors and Comorbidities Comorbidity 3+    Comorbidities Arthritis, Asthma, Anemia, Chronic Bronchitis, CKD  Stage 3, GERD, HLD, HTN, Migraines, Hypothyroidism, Obesity, A-Fib, Vitamin D Deficiency    Examination-Activity Limitations Locomotion Level;Bend    Examination-Participation Restrictions Driving;Community Activity    Stability/Clinical Decision Making Stable/Uncomplicated    Rehab Potential Good    PT Frequency 1x / week    PT Duration 8 weeks    PT Treatment/Interventions ADLs/Self Care Home Management;Canalith Repostioning;Cryotherapy;Electrical Stimulation;Moist Heat;Traction;DME Instruction;Gait training;Stair training;Functional mobility training;Therapeutic activities;Therapeutic exercise;Balance training;Neuromuscular re-education;Patient/family education;Manual techniques;Passive range of motion;Dry needling;Vestibular;Joint Manipulations;Spinal Manipulations    PT Next Visit Plan Added scapular retraction. [place] Check LTG. Patient wanted one last DN Session.    Consulted and Agree with Plan of Care Patient             Patient will benefit from skilled therapeutic intervention in order to improve the following deficits and impairments:  Dizziness, Decreased range of motion, Decreased activity tolerance, Impaired flexibility, Increased muscle spasms, Pain, Postural dysfunction, Decreased balance  Visit Diagnosis: Cervicalgia  Dizziness and giddiness     Problem List Patient Active Problem List   Diagnosis Date Noted   Anemia 06/21/2020   Leukocytosis 06/21/2020   Acute CHF ([place]) 05/31/2020   Acute CHF (congestive heart failure) ([place]) 05/30/2020   OSA (obstructive sleep apnea) 11/16/2016   Chronic anticoagulation 09/17/2016   Mitral valve insufficiency    Aortic valve regurgitation    Persistent atrial fibrillation ([place]) 07/30/2016   Essential hypertension, benign 06/07/2013  Unspecified vitamin D deficiency 06/07/2013   Pure hypercholesterolemia 06/07/2013   Morbid obesity ([place]) 06/07/2013   Hypothyroidism 06/07/2013   Chronic kidney disease, stage III  (moderate) ([place]) 06/07/2013   Contact dermatitis and other eczema, due to unspecified cause 06/07/2013    Jones Bales, PT, DPT 11/23/2020, 1:18 PM  [place] [address] [place], [place], [address] Phone: [phone]   Fax:  [phone]  Name: Leslie [name] MRN: [id number] Date of Birth: 01-May-1945

## 2020-11-23 NOTE — Patient Instructions (Signed)
Access Code: [facility] URL: [email or web address] Date: 11/23/2020 Prepared by: Baldomero Lamy  Exercises Supine Deep Neck Flexor Training on Blood Pressure Cuff - 1 x daily - 7 x weekly - 2 sets - 5 reps - 5 second hold Supine Isometric Neck Sidebend (Lateral Flexion as an alternative name) - 1 x daily - 7 x weekly - 1 sets - 10 reps - 4 second hold Supine Isometric Neck Rotation - 1 x daily - 7 x weekly - 1 sets - 10 reps - 4 second hold Supine Suboccipital Release with Tennis Balls - 1 x daily - 7 x weekly - 2 min hold Seated Scapular Retraction - 1 x daily - 7 x weekly - 1 sets - 10 reps

## 2020-11-28 ENCOUNTER — Other Ambulatory Visit: Payer: Self-pay

## 2020-11-28 ENCOUNTER — Encounter: Payer: Self-pay | Admitting: Physical Therapy

## 2020-11-28 ENCOUNTER — Ambulatory Visit: Payer: PPO | Admitting: Physical Therapy

## 2020-11-28 DIAGNOSIS — M542 Cervicalgia: Secondary | ICD-10-CM | POA: Diagnosis not present

## 2020-11-28 DIAGNOSIS — R42 Dizziness and giddiness: Secondary | ICD-10-CM

## 2020-11-28 NOTE — Therapy (Signed)
[facility] [address] [place], [place], [address] Phone: [phone]   Fax:  [phone]  Physical Therapy Treatment and D/C Summary  Patient Details  Name: Leslie [name] MRN: [id number] Date of Birth: March 21, 1946 Referring Provider (PT): Sela Hilding, MD   Encounter Date: 11/28/2020   PT End of Session - 11/28/20 1144     Visit Number 8    Number of Visits 9    Date for PT Re-Evaluation 12/01/20    Authorization Type [employer] (10th Visit PN)    Progress Note Due on Visit 10    PT Start Time 0805    PT Stop Time 0845    PT Time Calculation (min) 40 min    Activity Tolerance Patient tolerated treatment well    Behavior During Therapy [facility] for tasks assessed/performed             Past Medical History:  Diagnosis Date   Adenomatous polyp    Arthritis    "knees, legs, fingers" (07/30/2016)   Asthma    Bursitis of left shoulder    "just finished PT" (07/30/2016)   Chest pain    a. 2003 Abnl stress test-->Cath: nonobs CAD.   Chronic bronchitis (HCC)    Chronic kidney disease    stage 3   GERD (gastroesophageal reflux disease)    barrett's esophagus- Dr. Cristina Gong   History of hiatal hernia    Hyperlipidemia    Hypertension    Hypothyroidism    Migraines    "sporatic; at least a few/year" (07/30/2016)   Mitral regurgitation    a. 07/2016 Echo: mild to mod MR;  b. 07/2016 TEE: mild MR.   Moderate aortic insufficiency    a. 07/2016 Echo: EF 55-60%, mod AI;  b. 07/2016 TEE: EF 50-55%, mod AI.   Obesity    s/p lap band surgery 6/10   OSA on CPAP    PAF (paroxysmal atrial fibrillation) ([place])    a. 07/2016 TEE/DCCV: EF 50-55%, mild MR, mod AI, mild to mod TR, neg bubble study-->successful DCCV x1 w/ 120J.   PONV (postoperative nausea and vomiting)    Vitamin D deficiency     Past Surgical History:  Procedure Laterality Date   APPENDECTOMY     incidental   CARDIOVERSION N/A 08/01/2016    Procedure: CARDIOVERSION;  Surgeon: Jerline Pain, MD;  Location: [facility] ENDOSCOPY;  Service: Cardiovascular;  Laterality: N/A;   CARDIOVERSION N/A 06/24/2019   Procedure: CARDIOVERSION;  Surgeon: Sanda Klein, MD;  Location: [place];  Service: Cardiovascular;  Laterality: N/A;   CARDIOVERSION N/A 05/30/2020   Procedure: CARDIOVERSION;  Surgeon: Sanda Klein, MD;  Location: [place] ENDOSCOPY;  Service: Cardiovascular;  Laterality: N/A;   CARDIOVERSION N/A 05/09/2020   Procedure: CARDIOVERSION;  Surgeon: [name], MD;  Location: [place];  Service: Cardiovascular;  Laterality: N/A;   CATARACT EXTRACTION W/ INTRAOCULAR LENS  IMPLANT, BILATERAL Bilateral ~ 2008-2017   left - right   COMBINED HYSTERECTOMY VAGINAL / OOPHORECTOMY / A&P [place]  09/2008   TEE WITHOUT CARDIOVERSION N/A 08/01/2016   Procedure: TRANSESOPHAGEAL ECHOCARDIOGRAM (TEE);  Surgeon: Jerline Pain, MD;  Location: [facility] ENDOSCOPY;  Service: Cardiovascular;  Laterality: N/A;   [place]   "w/1 ovary"    There were no vitals filed for this  visit.   Subjective Assessment - 11/28/20 0807     Subjective Has been performing new scapular retraction exercise and appreciates the insight about overuse of shoulder elevation.  Only got about 3 hours of sleep last night.  Still not having any dizziness.    Pertinent History Arthritis, Asthma, Anemia, Chronic Bronchitis, CKD Stage 3, GERD, HLD, HTN, Migraines, Hypothyroidism, Obesity, A-Fib, Vitamin D Deficiency    Limitations Standing;Walking    Patient Stated Goals ensure its not vertigo; address neck pain    Currently in Pain? Yes                [facility] PT Assessment - 11/28/20 0840       ROM / Strength   AROM / PROM / Strength AROM      AROM   AROM Assessment Site Cervical    Cervical  Flexion 60    Cervical Extension 30    Cervical - Right Side Bend 30    Cervical - Left Side Bend 30    Cervical - Right Rotation 35    Cervical - Left Rotation 30              [facility] Adult PT Treatment/Exercise - 11/28/20 0844       Therapeutic Activites    Therapeutic Activities Other Therapeutic Activities    Other Therapeutic Activities Discussed ways for pt to continue to incorporate what she learned in therapy to maintain gains including: stretching, breathing, postural exercises, stress relief.      Manual Therapy   Manual Therapy Joint mobilization;Soft tissue mobilization    Manual therapy comments Performed in supine to L and R side after dry needling    Joint Mobilization Performed Grade II 1st rib mobilizations and depression to L and R side; Increased time spent on L    Soft tissue mobilization STM to bilateral upper trap muscles after dry needling to decrease tension, increase blood flow and tissue mobility              Trigger Point Dry Needling - 11/28/20 0809     Consent Given? Yes    Education Handout Provided Previously provided    Muscles Treated Head and Neck Upper trapezius    Dry Needling Comments Performed in supine to L and R side; R side more reactive today compared to L    Upper Trapezius Response Twitch reponse elicited;Palpable increased muscle length                  PT Education - 11/28/20 1143     Education Details See TA    Person(s) Educated Patient    Methods Explanation    Comprehension Verbalized understanding              PT Short Term Goals - 11/10/20 1119       PT SHORT TERM GOAL #1   Title Patient will be independent with initial vestibular/ROM/balance HEP (All STGs Due: 11/03/2020)    Time 4    Period Weeks    Status Achieved    Target Date 11/03/20      PT SHORT TERM GOAL #2   Title DVA/Cervical ROM to be assessed and LTG to be set as applicable    Status Achieved               PT Long Term  Goals - 11/28/20 1146       PT LONG TERM GOAL #1   Title Patient will be independent with final vestibular/ROM/balance  HEP (ALL LTGS Due: 12/01/2020)    Baseline no HEP established; reports independence with HEP    Time 8    Period Weeks    Status Achieved      PT LONG TERM GOAL #2   Title Pt will increase cervical spine extension, lateral flexion and rotation by 10-15 degrees to improve ROM for driving and vestibular treatment    Baseline 10-20 deg improvement overall    Time 8    Period Weeks    Status Achieved      PT LONG TERM GOAL #3   Title DVA goal deferred at this time due to significant ROM limitations and pain    Status Deferred      PT LONG TERM GOAL #4   Title Patient will report 50% improvement in pain/dizziness with neck movements to allow for improved activity tolerance    Baseline patient reports 45% - 50% improvement    Time 8    Period Weeks    Status Achieved      PT LONG TERM GOAL #5   Title Patient will improve DFS >/= 61, and DPS >/= 65    Baseline DFS: 58. DPS: 60.4; DFS: 58, DPS: 65.5    Time 8    Period Weeks    Status Partially Met                   Plan - 11/28/20 1146     Clinical Impression Statement Performed one final dry needling and manual therapy treatment followed by final assessment of cervical ROM.  Pt has met final LTG and demonstrates overall a 10-20 deg improvement in cervical ROM, decreased pain and resolution of dizziness.  Pt is independent with HEP and recommendations to maintain gains made in therapy.  Pt is ready for D/C from therapy at this time.    Comorbidities Arthritis, Asthma, Anemia, Chronic Bronchitis, CKD Stage 3, GERD, HLD, HTN, Migraines, Hypothyroidism, Obesity, A-Fib, Vitamin D Deficiency    PT Treatment/Interventions ADLs/Self Care Home Management;Canalith Repostioning;Cryotherapy;Electrical Stimulation;Moist Heat;Traction;DME Instruction;Gait training;Stair training;Functional mobility training;Therapeutic  activities;Therapeutic exercise;Balance training;Neuromuscular re-education;Patient/family education;Manual techniques;Passive range of motion;Dry needling;Vestibular;Joint Manipulations;Spinal Manipulations    PT Next Visit Plan D/C    Consulted and Agree with Plan of Care Patient             Patient will benefit from skilled therapeutic intervention in order to improve the following deficits and impairments:  Dizziness, Decreased range of motion, Decreased activity tolerance, Impaired flexibility, Increased muscle spasms, Pain, Postural dysfunction, Decreased balance  Visit Diagnosis: Cervicalgia  Dizziness and giddiness     Problem List Patient Active Problem List   Diagnosis Date Noted   Anemia 06/21/2020   Leukocytosis 06/21/2020   Acute CHF ([place]) 05/31/2020   Acute CHF (congestive heart failure) ([place]) 05/30/2020   OSA (obstructive sleep apnea) 11/16/2016   Chronic anticoagulation 09/17/2016   Mitral valve insufficiency    Aortic valve regurgitation    Persistent atrial fibrillation ([place]) 07/30/2016   Essential hypertension, benign 06/07/2013   Unspecified vitamin D deficiency 06/07/2013   Pure hypercholesterolemia 06/07/2013   Morbid obesity ([place]) 06/07/2013   Hypothyroidism 06/07/2013   Chronic kidney disease, stage III (moderate) ([place]) 06/07/2013   Contact dermatitis and other eczema, due to unspecified cause 06/07/2013    PHYSICAL THERAPY DISCHARGE SUMMARY  Visits from Start of Care: 8  Current functional level related to goals / functional outcomes: See LTG achievement and impression statement above.   Remaining deficits: Mild ROM restrictions in  cervical spine   Education / Equipment: HEP   Patient agrees to discharge. Patient goals were met. Patient is being discharged due to meeting the stated rehab goals.  Rico Junker, PT, DPT 11/28/20    11:50 AM    [facility] [address] [place], [place], [address] Phone: [phone]   Fax:  [phone]  Name: JASIYA MARKIE MRN: [id number] Date of Birth: 1945/08/20

## 2020-12-04 ENCOUNTER — Other Ambulatory Visit: Payer: Self-pay | Admitting: Family Medicine

## 2020-12-04 DIAGNOSIS — Z1231 Encounter for screening mammogram for malignant neoplasm of breast: Secondary | ICD-10-CM

## 2020-12-07 ENCOUNTER — Ambulatory Visit (HOSPITAL_COMMUNITY)
Admission: RE | Admit: 2020-12-07 | Discharge: 2020-12-07 | Disposition: A | Discharge status: Home / Self Care | Payer: PPO | Source: Ambulatory Visit | Attending: Nurse Practitioner | Admitting: Nurse Practitioner

## 2020-12-07 ENCOUNTER — Encounter (HOSPITAL_COMMUNITY): Payer: Self-pay | Admitting: Nurse Practitioner

## 2020-12-07 ENCOUNTER — Other Ambulatory Visit: Payer: Self-pay

## 2020-12-07 VITALS — BP 140/70 | HR 57 | Ht 66.0 in | Wt 270.6 lb

## 2020-12-07 DIAGNOSIS — D6869 Other thrombophilia: Secondary | ICD-10-CM | POA: Diagnosis not present

## 2020-12-07 DIAGNOSIS — Z79899 Other long term (current) drug therapy: Secondary | ICD-10-CM | POA: Insufficient documentation

## 2020-12-07 DIAGNOSIS — Z7901 Long term (current) use of anticoagulants: Secondary | ICD-10-CM | POA: Diagnosis not present

## 2020-12-07 DIAGNOSIS — G4733 Obstructive sleep apnea (adult) (pediatric): Secondary | ICD-10-CM | POA: Diagnosis not present

## 2020-12-07 DIAGNOSIS — Z6841 Body Mass Index (BMI) 40.0 and over, adult: Secondary | ICD-10-CM | POA: Diagnosis not present

## 2020-12-07 DIAGNOSIS — Z885 Allergy status to narcotic agent status: Secondary | ICD-10-CM | POA: Diagnosis not present

## 2020-12-07 DIAGNOSIS — G43909 Migraine, unspecified, not intractable, without status migrainosus: Secondary | ICD-10-CM | POA: Diagnosis not present

## 2020-12-07 DIAGNOSIS — I480 Paroxysmal atrial fibrillation: Secondary | ICD-10-CM | POA: Diagnosis not present

## 2020-12-07 DIAGNOSIS — I4819 Other persistent atrial fibrillation: Secondary | ICD-10-CM | POA: Diagnosis not present

## 2020-12-07 DIAGNOSIS — I10 Essential (primary) hypertension: Secondary | ICD-10-CM | POA: Insufficient documentation

## 2020-12-07 DIAGNOSIS — Z87891 Personal history of nicotine dependence: Secondary | ICD-10-CM | POA: Insufficient documentation

## 2020-12-07 DIAGNOSIS — Z882 Allergy status to sulfonamides status: Secondary | ICD-10-CM | POA: Insufficient documentation

## 2020-12-07 DIAGNOSIS — E669 Obesity, unspecified: Secondary | ICD-10-CM | POA: Diagnosis not present

## 2020-12-07 NOTE — Progress Notes (Signed)
Primary Care Physician: Glenis Smoker, MD Primary Cardiologist: Dr Sallyanne Kuster Primary Electrophysiologist: none Referring Physician: [facility] triage   Leslie [name] is a 75 y.o. female with a history of CKD, HLD, HTN, hypothyroidism, mild-mod MR, mod AI, OSA, and atrial fibrillation who presents for consultation in the [facility].  The patient was initially diagnosed with atrial fibrillation 07/2016 and had DCCV at that time. Beta-blocker dose was decreased in May 2018 when she had junctional rhythm.  After she received her second dose of coronavirus vaccine in February 2021 she developed symptomatic persistent atrial fibrillation with RVR, again challenging to rate control and underwent cardioversion on June 24, 2019. Patient is on Eliquis for a CHADS2VASC score of 3.   She went back  into the afib 04/29/20  with RVR. She called into the answering service and spoke to [place], [name]. He increased her dose of metoprolol to 100 mg in  the pm (usual dose  100 mg  in am and 50 mg in pm) Her v range at home is 90's to 120. She feels she went into afib with worries over a sick cat and the weather. She  does feel fatigue and shortness of breath with activity in afib. She is compliant with CPAP, minimal caffeine, no alcohol, no tobacco. No missed anticoagulation. Has had all vaccines.  F/u successful cardioversion, 05/09/20. unfortunately she is back in afib with RVR. Dicussed antiarrythmic's with pt and with Dr. Loletha Grayer. We discussed use of flecainide as she does not want to use amiodarone with her known thyroid disease. She  will need a stress test which Dr. Loletha Grayer will coordinate and then will get loaded on flecainide with a low risk test and try to get cardioverted in a timely manner. Pt is in agreement. No missed anticoagulation. No conduction issues on ekg's in SR. EF was normal on latest echo.   F/u in afib clinic, 12/07/20, for f/u from Dr. Loletha Grayer for flecainide surveillance.  She has been staying in SR on flecainide. She has a h/o of migraines and feel that they may have increased around the time of starting flecainide but cannot say for sure this is what is triggering them again. She was having some bradycardia buyt her drugs was tweaked by Dr. Loletha Grayer in May  and this has improved.   Today, she denies symptoms of  palpitations, chest pain, shortness of breath/fatigue, no  orthopnea, PND, lower extremity edema, dizziness, presyncope, syncope, snoring, daytime somnolence, bleeding, or neurologic sequela. The patient is tolerating medications without difficulties and is otherwise without complaint today.    Atrial Fibrillation Risk Factors:  she does have symptoms or diagnosis of sleep apnea. she is compliant with CPAP therapy. she does not have a history of rheumatic fever. she does not have a history of alcohol use. The patient does not have a history of early familial atrial fibrillation or other arrhythmias.  she has a BMI of Body mass index is 43.68 kg/m.Marland Kitchen Filed Weights   12/07/20 1058  Weight: 122.7 kg    Family History  Problem Relation Age of Onset   Heart failure Mother    Hypertension Mother    Hypertension Father    Emphysema Father      Atrial Fibrillation Management history:  Previous antiarrhythmic drugs: none Previous cardioversions: 08/01/2016, 06/24/19 Previous ablations: none CHADS2VASC score:  Anticoagulation history: Eliquis   Past Medical History:  Diagnosis Date   Adenomatous polyp    Arthritis    "  knees, legs, fingers" (07/30/2016)   Asthma    Bursitis of left shoulder    "just finished PT" (07/30/2016)   Chest pain    a. 2003 Abnl stress test-->Cath: nonobs CAD.   Chronic bronchitis (HCC)    Chronic kidney disease    stage 3   GERD (gastroesophageal reflux disease)    barrett's esophagus- Dr. Cristina Gong   History of hiatal hernia    Hyperlipidemia    Hypertension    Hypothyroidism    Migraines    "sporatic; at least a  few/year" (07/30/2016)   Mitral regurgitation    a. 07/2016 Echo: mild to mod MR;  b. 07/2016 TEE: mild MR.   Moderate aortic insufficiency    a. 07/2016 Echo: EF 55-60%, mod AI;  b. 07/2016 TEE: EF 50-55%, mod AI.   Obesity    s/p lap band surgery 6/10   OSA on CPAP    PAF (paroxysmal atrial fibrillation) ([place])    a. 07/2016 TEE/DCCV: EF 50-55%, mild MR, mod AI, mild to mod TR, neg bubble study-->successful DCCV x1 w/ 120J.   PONV (postoperative nausea and vomiting)    Vitamin D deficiency    Past Surgical History:  Procedure Laterality Date   APPENDECTOMY     incidental   CARDIOVERSION N/A 08/01/2016   Procedure: CARDIOVERSION;  Surgeon: Jerline Pain, MD;  Location: [facility] ENDOSCOPY;  Service: Cardiovascular;  Laterality: N/A;   CARDIOVERSION N/A 06/24/2019   Procedure: CARDIOVERSION;  Surgeon: Sanda Klein, MD;  Location: [place];  Service: Cardiovascular;  Laterality: N/A;   CARDIOVERSION N/A 05/30/2020   Procedure: CARDIOVERSION;  Surgeon: Sanda Klein, MD;  Location: [place] ENDOSCOPY;  Service: Cardiovascular;  Laterality: N/A;   CARDIOVERSION N/A 05/09/2020   Procedure: CARDIOVERSION;  Surgeon: [name], MD;  Location: [place];  Service: Cardiovascular;  Laterality: N/A;   CATARACT EXTRACTION W/ INTRAOCULAR LENS  IMPLANT, BILATERAL Bilateral ~ 2008-2017   left - right   COMBINED HYSTERECTOMY VAGINAL / OOPHORECTOMY / A&P [place]  09/2008   TEE WITHOUT CARDIOVERSION N/A 08/01/2016   Procedure: TRANSESOPHAGEAL ECHOCARDIOGRAM (TEE);  Surgeon: Jerline Pain, MD;  Location: [facility];  Service: Cardiovascular;  Laterality: N/A;   [place]   "w/1 ovary"    Current Outpatient Medications  Medication Sig Dispense Refill   acetaminophen (TYLENOL) 650 MG CR tablet Take 1,300 mg by mouth every 8  (eight) hours as needed for pain.     apixaban (ELIQUIS) 5 MG TABS tablet TAKE 1 TABLET(5 MG) BY MOUTH TWICE DAILY 180 tablet 1   atorvastatin (LIPITOR) 10 MG tablet Take 10 mg by mouth daily.     Bacillus Coagulans-Inulin (ALIGN PREBIOTIC-PROBIOTIC PO) Take 2 capsules by mouth daily. Gummy     cholecalciferol (VITAMIN D) 1000 UNITS tablet Take 1,000 Units by mouth daily.     diclofenac Sodium (VOLTAREN) 1 % GEL Apply 1 application topically 2 (two) times daily.     fexofenadine (ALLEGRA) 180 MG tablet Take 180 mg by mouth daily.     flecainide (TAMBOCOR) 50 MG tablet TAKE 1 TABLET(50 MG) BY MOUTH TWICE DAILY 180 tablet 3   fluticasone (FLONASE) 50 MCG/ACT nasal spray Place 2 sprays into both nostrils at bedtime.      fluticasone (FLOVENT HFA) 44 MCG/ACT inhaler Inhale 2 puffs into the lungs  2 (two) times daily. (Patient taking differently: Inhale 2 puffs into the lungs at bedtime.) 1 Inhaler 0   furosemide (LASIX) 20 MG tablet Take 1 tablet (20 mg total) by mouth daily. 90 tablet 1   Glucosamine-Chondroitin (OSTEO BI-FLEX REGULAR STRENGTH PO) Take 1 tablet by mouth daily.     levothyroxine (SYNTHROID, LEVOTHROID) 50 MCG tablet Take 50 mcg by mouth daily before breakfast.     MAGNESIUM-OXIDE 400 (241.3 Mg) MG tablet TAKE 1 TABLET BY MOUTH DAILY 90 tablet 3   metoprolol succinate (TOPROL-XL) 100 MG 24 hr tablet Take 100 mg (one tablet) in the morning and 50 mg (half a tablet) in the evening. 135 tablet 3   metroNIDAZOLE (METROCREAM) 0.75 % cream Apply 1 application topically daily.     mometasone (ELOCON) 0.1 % cream Apply 1 application topically 2 (two) times daily as needed (skin irritation).     Multiple Vitamins-Minerals (CENTRUM MULTIGUMMIES) CHEW Chew 1 tablet by mouth daily.     omeprazole (PRILOSEC) 20 MG capsule Take 40 mg by mouth daily before breakfast.     senna (SENOKOT) 8.6 MG tablet Take 1 tablet by mouth every evening.     verapamil (CALAN-SR) 180 MG CR tablet Take 1 tablet (180 mg  total) by mouth at bedtime. 90 tablet 3   Psyllium (METAMUCIL) 28.3 % POWD Take 1 Scoop by mouth at bedtime. (Patient not taking: Reported on 12/07/2020)     No current facility-administered medications for this encounter.    Allergies  Allergen Reactions   Clindamycin Hcl     Lip and face swelling   Augmentin [Amoxicillin-Pot Clavulanate] Diarrhea    Did it involve swelling of the face/tongue/throat, SOB, or low BP? No Did it involve sudden or severe rash/hives, skin peeling, or any reaction on the inside of your mouth or nose? No Did you need to seek medical attention at a hospital or doctor's office? No When did it last happen?      10 + years If all above answers are "NO", may proceed with cephalosporin use.    Codeine Nausea Only and Other (See Comments)    Hyperactivity   Demerol [Meperidine] Nausea And Vomiting   Sulfa Antibiotics Rash    Social History   Socioeconomic History   Marital status: Divorced    Spouse name: Not on file   Number of children: Not on file   Years of education: Not on file   Highest education level: Not on file  Occupational History   Not on file  Tobacco Use   Smoking status: Former   Smokeless tobacco: Never   Tobacco comments:    07/30/2016 "social smoker only; in college"  Vaping Use   Vaping Use: Never used  Substance and Sexual Activity   Alcohol use: Yes    Alcohol/week: 2.0 standard drinks    Types: 1 Glasses of wine, 1 Standard drinks or equivalent per week    Comment: 07/30/2016 "couple drinks/month"   Drug use: No   Sexual activity: Not Currently  Other Topics Concern   Not on file  Social History Narrative   Not on file   Social Determinants of Health   Financial Resource Strain: Not on file  Food Insecurity: Not on file  Transportation Needs: Not on file  Physical Activity: Not on file  Stress: Not on file  Social Connections: Not on file  Intimate Partner Violence: Not on file     ROS- All systems are reviewed  and negative except as  per the HPI above.  Physical Exam: Vitals:   12/07/20 1058  BP: 140/70  Pulse: (!) 57  Weight: 122.7 kg  Height: '5\' 6"'$  (1.676 m)    GEN- The patient is well appearing, alert and oriented x 3 today.   Head- normocephalic, atraumatic Eyes-  Sclera clear, conjunctiva pink Ears- hearing intact Oropharynx- clear Neck- supple  Lungs- Clear to ausculation bilaterally, normal work of breathing Heart- irregular rate and rhythm, no murmurs, rubs or gallops  GI- soft, NT, ND, + BS Extremities- no clubbing, cyanosis, or edema MS- no significant deformity or atrophy Skin- no rash or lesion Psych- euthymic mood, full affect Neuro- strength and sensation are intact  Wt Readings from Last 3 Encounters:  12/07/20 122.7 kg  09/06/20 122.8 kg  09/05/20 122.7 kg    EKG today demonstrates  sinus brady at 57 bpm, qrs int 90 ms, qtc 418 ms   Echo 07/08/19 demonstrated  1. Left ventricular ejection fraction, by estimation, is 60 to 65%. The  left ventricle has normal function. The left ventricle has no regional  wall motion abnormalities. Left ventricular diastolic parameters are  consistent with Grade II diastolic  dysfunction (pseudonormalization). Elevated left ventricular end-diastolic  pressure. The average left ventricular global longitudinal strain is -20.5  %.   2. Right ventricular systolic function is normal. The right ventricular  size is normal. Tricuspid regurgitation signal is inadequate for assessing  PA pressure.   3. Left atrial size was moderately dilated.   4. The mitral valve is normal in structure. Mild mitral valve  regurgitation. No evidence of mitral stenosis.   5. The aortic valve is normal in structure. Aortic valve regurgitation is  moderate. No aortic stenosis is present. Aortic regurgitation PHT measures  341 msec.   6. The inferior vena cava is normal in size with greater than 50%  respiratory variability, suggesting right atrial  pressure of 3 mmHg.   Comparison(s): 07/21/16 EF 55-60%.   Epic records are reviewed at length today  CHA2DS2-VASc Score = 4  The patient's score is based upon: CHF History: Yes HTN History: Yes Diabetes History: No Stroke History: No Vascular Disease History: No Age Score: 1 Gender Score: 1    :[id number]    ASSESSMENT AND PLAN: 1. Persistent Atrial Fibrillation (ICD10:  I48.19) The patient's CHA2DS2-VASc score is 4, indicating a 4.8% annual risk of stroke.   Cardioversion 1/25 was successful but wnet back into afib 1/29 with RVR Afib is quiet with flecainide on board Continue current doses of verapamil and metoprolol   2. Secondary Hypercoagulable State (ICD10:  D68.69) The patient is at significant risk for stroke/thromboembolism based upon her CHA2DS2-VASc Score of 4.  Continue Apixaban (Eliquis).  States no missed anticoagulation   3. Obesity Body mass index is 43.68 kg/m.   4. Obstructive sleep apnea Pt is very compliant with CPAP use   5. HTN Stable, no changes today.  6. Migraines She plans to talk to her PCP re seeing a neurologist  Not sure if worsened by flecainide , I do not usually see H/A's associated with the drug She wants to continue  with this as she has not had any afib since staring it   F/u with Dr. Loletha Grayer.  in December as scheduled    Geroge Baseman. Aaisha Sliter, [facility] [address] [place], [place] [address] [phone]

## 2020-12-13 ENCOUNTER — Other Ambulatory Visit: Payer: Self-pay

## 2020-12-13 ENCOUNTER — Inpatient Hospital Stay: Payer: PPO | Attending: Physician Assistant | Admitting: Internal Medicine

## 2020-12-13 ENCOUNTER — Inpatient Hospital Stay: Payer: PPO

## 2020-12-13 VITALS — BP 146/78 | HR 69 | Temp 97.4°F | Resp 20 | Ht 66.0 in | Wt 269.8 lb

## 2020-12-13 DIAGNOSIS — D509 Iron deficiency anemia, unspecified: Secondary | ICD-10-CM | POA: Insufficient documentation

## 2020-12-13 DIAGNOSIS — D649 Anemia, unspecified: Secondary | ICD-10-CM

## 2020-12-13 DIAGNOSIS — E039 Hypothyroidism, unspecified: Secondary | ICD-10-CM | POA: Diagnosis not present

## 2020-12-13 DIAGNOSIS — D500 Iron deficiency anemia secondary to blood loss (chronic): Secondary | ICD-10-CM | POA: Diagnosis not present

## 2020-12-13 DIAGNOSIS — D72829 Elevated white blood cell count, unspecified: Secondary | ICD-10-CM

## 2020-12-13 LAB — CBC WITH DIFFERENTIAL (CANCER CENTER ONLY)
Abs Immature Granulocytes: 0.03 10*3/uL (ref 0.00–0.07)
Basophils Absolute: 0.1 10*3/uL (ref 0.0–0.1)
Basophils Relative: 1 %
Eosinophils Absolute: 0.1 10*3/uL (ref 0.0–0.5)
Eosinophils Relative: 2 %
HCT: 45.3 % (ref 36.0–46.0)
Hemoglobin: 14.6 g/dL (ref 12.0–15.0)
Immature Granulocytes: 0 %
Lymphocytes Relative: 21 %
Lymphs Abs: 1.9 10*3/uL (ref 0.7–4.0)
MCH: 26.9 pg (ref 26.0–34.0)
MCHC: 32.2 g/dL (ref 30.0–36.0)
MCV: 83.6 fL (ref 80.0–100.0)
Monocytes Absolute: 0.6 10*3/uL (ref 0.1–1.0)
Monocytes Relative: 7 %
Neutro Abs: 6.2 10*3/uL (ref 1.7–7.7)
Neutrophils Relative %: 69 %
Platelet Count: 267 10*3/uL (ref 150–400)
RBC: 5.42 MIL/uL — ABNORMAL HIGH (ref 3.87–5.11)
RDW: 16.5 % — ABNORMAL HIGH (ref 11.5–15.5)
WBC Count: 8.9 10*3/uL (ref 4.0–10.5)
nRBC: 0 % (ref 0.0–0.2)

## 2020-12-13 LAB — IRON AND TIBC
Iron: 67 ug/dL (ref 41–142)
Saturation Ratios: 18 % — ABNORMAL LOW (ref 21–57)
TIBC: 364 ug/dL (ref 236–444)
UIBC: 297 ug/dL (ref 120–384)

## 2020-12-13 LAB — FERRITIN: Ferritin: 18 ng/mL (ref 11–307)

## 2020-12-13 NOTE — Progress Notes (Signed)
[facility] [phone]   [phone]  OFFICE PROGRESS NOTE  Glenis Smoker, MD [place] [address]  DIAGNOSIS: Iron deficiency anemia.  PRIOR THERAPY: Iron infusion with Venofer 300 Mg IV every week x3 weeks.  Last dose was giving September 13, 2020  CURRENT THERAPY: None  INTERVAL HISTORY: Leslie [name] 75 y.o. female returns to the clinic today for 17-monthfollow-up visit.  The patient is feeling fine today with no concerning complaints.  She has more stamina and less fatigue than before.  She denied having any dizzy spells.  She has no chest pain, shortness of breath, cough or hemoptysis.  She denied having any fever or chills.  She has no nausea, vomiting, diarrhea or constipation.  She has no headache or visual changes.  She received iron infusion with Venofer the last few weeks of May 2022 and she tolerated it fairly well.  The patient is here today for evaluation and repeat CBC, iron study and ferritin.  MEDICAL HISTORY: Past Medical History:  Diagnosis Date   Adenomatous polyp    Arthritis    "knees, legs, fingers" (07/30/2016)   Asthma    Bursitis of left shoulder    "just finished PT" (07/30/2016)   Chest pain    a. 2003 Abnl stress test-->Cath: nonobs CAD.   Chronic bronchitis (HCC)    Chronic kidney disease    stage 3   GERD (gastroesophageal reflux disease)    barrett's esophagus- Dr. BCristina Gong  History of hiatal hernia    Hyperlipidemia    Hypertension    Hypothyroidism    Migraines    "sporatic; at least a few/year" (07/30/2016)   Mitral regurgitation    a. 07/2016 Echo: mild to mod MR;  b. 07/2016 TEE: mild MR.   Moderate aortic insufficiency    a. 07/2016 Echo: EF 55-60%, mod AI;  b. 07/2016 TEE: EF 50-55%, mod AI.   Obesity    s/p lap band surgery 6/10   OSA on CPAP    PAF (paroxysmal atrial fibrillation) ([facility]    a. 07/2016 TEE/DCCV: EF 50-55%, mild MR, mod AI, mild to mod TR, neg bubble  study-->successful DCCV x1 w/ 120J.   PONV (postoperative nausea and vomiting)    Vitamin D deficiency     ALLERGIES:  is allergic to clindamycin hcl, augmentin [amoxicillin-pot clavulanate], codeine, demerol [meperidine], and sulfa antibiotics.  MEDICATIONS:  Current Outpatient Medications  Medication Sig Dispense Refill   acetaminophen (TYLENOL) 650 MG CR tablet Take 1,300 mg by mouth every 8 (eight) hours as needed for pain.     apixaban (ELIQUIS) 5 MG TABS tablet TAKE 1 TABLET(5 MG) BY MOUTH TWICE DAILY 180 tablet 1   atorvastatin (LIPITOR) 10 MG tablet Take 10 mg by mouth daily.     Bacillus Coagulans-Inulin (ALIGN PREBIOTIC-PROBIOTIC PO) Take 2 capsules by mouth daily. Gummy     cholecalciferol (VITAMIN D) 1000 UNITS tablet Take 1,000 Units by mouth daily.     diclofenac Sodium (VOLTAREN) 1 % GEL Apply 1 application topically 2 (two) times daily.     fexofenadine (ALLEGRA) 180 MG tablet Take 180 mg by mouth daily.     flecainide (TAMBOCOR) 50 MG tablet TAKE 1 TABLET(50 MG) BY MOUTH TWICE DAILY 180 tablet 3   fluticasone (FLONASE) 50 MCG/ACT nasal spray Place 2 sprays into both nostrils at bedtime.      fluticasone (FLOVENT HFA) 44 MCG/ACT inhaler Inhale 2 puffs into  the lungs 2 (two) times daily. (Patient taking differently: Inhale 2 puffs into the lungs at bedtime.) 1 Inhaler 0   furosemide (LASIX) 20 MG tablet Take 1 tablet (20 mg total) by mouth daily. 90 tablet 1   Glucosamine-Chondroitin (OSTEO BI-FLEX REGULAR STRENGTH PO) Take 1 tablet by mouth daily.     levothyroxine (SYNTHROID, LEVOTHROID) 50 MCG tablet Take 50 mcg by mouth daily before breakfast.     MAGNESIUM-OXIDE 400 (241.3 Mg) MG tablet TAKE 1 TABLET BY MOUTH DAILY 90 tablet 3   metoprolol succinate (TOPROL-XL) 100 MG 24 hr tablet Take 100 mg (one tablet) in the morning and 50 mg (half a tablet) in the evening. 135 tablet 3   metroNIDAZOLE (METROCREAM) 0.75 % cream Apply 1 application topically daily.     mometasone  (ELOCON) 0.1 % cream Apply 1 application topically 2 (two) times daily as needed (skin irritation).     Multiple Vitamins-Minerals (CENTRUM MULTIGUMMIES) CHEW Chew 1 tablet by mouth daily.     omeprazole (PRILOSEC) 20 MG capsule Take 40 mg by mouth daily before breakfast.     Psyllium (METAMUCIL) 28.3 % POWD Take 1 Scoop by mouth at bedtime. (Patient not taking: Reported on 12/07/2020)     senna (SENOKOT) 8.6 MG tablet Take 1 tablet by mouth every evening.     verapamil (CALAN-SR) 180 MG CR tablet Take 1 tablet (180 mg total) by mouth at bedtime. 90 tablet 3   No current facility-administered medications for this visit.    SURGICAL HISTORY:  Past Surgical History:  Procedure Laterality Date   APPENDECTOMY     incidental   CARDIOVERSION N/A 08/01/2016   Procedure: CARDIOVERSION;  Surgeon: Jerline Pain, MD;  Location: [facility];  Service: Cardiovascular;  Laterality: N/A;   CARDIOVERSION N/A 06/24/2019   Procedure: CARDIOVERSION;  Surgeon: Sanda Klein, MD;  Location: [facility];  Service: Cardiovascular;  Laterality: N/A;   CARDIOVERSION N/A 05/30/2020   Procedure: CARDIOVERSION;  Surgeon: Sanda Klein, MD;  Location: [facility];  Service: Cardiovascular;  Laterality: N/A;   CARDIOVERSION N/A 05/09/2020   Procedure: CARDIOVERSION;  Surgeon: [name], MD;  Location: [facility];  Service: Cardiovascular;  Laterality: N/A;   CATARACT EXTRACTION W/ INTRAOCULAR LENS  IMPLANT, BILATERAL Bilateral ~ 2008-2017   left - right   COMBINED HYSTERECTOMY VAGINAL / OOPHORECTOMY / A&P [place]  09/2008   TEE WITHOUT CARDIOVERSION N/A 08/01/2016   Procedure: TRANSESOPHAGEAL ECHOCARDIOGRAM (TEE);  Surgeon: Jerline Pain, MD;  Location: [facility];  Service: Cardiovascular;  Laterality: N/A;   [place]    "w/1 ovary"    REVIEW OF SYSTEMS:  A comprehensive review of systems was negative.   PHYSICAL EXAMINATION: General appearance: alert, cooperative, and no distress Head: Normocephalic, without obvious abnormality, atraumatic Neck: no adenopathy, no JVD, supple, symmetrical, trachea midline, and thyroid not enlarged, symmetric, no tenderness/mass/nodules Lymph nodes: Cervical, supraclavicular, and axillary nodes normal. Resp: clear to auscultation bilaterally Back: symmetric, no curvature. ROM normal. No CVA tenderness. Cardio: regular rate and rhythm, S1, S2 normal, no murmur, click, rub or gallop GI: soft, non-tender; bowel sounds normal; no masses,  no organomegaly Extremities: extremities normal, atraumatic, no cyanosis or edema  ECOG PERFORMANCE STATUS: 0 - Asymptomatic  Blood pressure (!) 146/78, pulse 69, temperature (!) 97.4 F (36.3 C), temperature source  Tympanic, resp. rate 20, height '5\' 6"'$  (1.676 m), weight 269 lb 12.8 oz (122.4 kg), SpO2 99 %.  LABORATORY DATA: Lab Results  Component Value Date   WBC 8.9 12/13/2020   HGB 14.6 12/13/2020   HCT 45.3 12/13/2020   MCV 83.6 12/13/2020   PLT 267 12/13/2020      Chemistry      Component Value Date/Time   NA 139 09/09/2020 1128   NA 137 06/07/2020 1045   K 4.0 09/09/2020 1128   CL 107 09/09/2020 1128   CO2 20 (L) 09/09/2020 1105   BUN 19 09/09/2020 1128   BUN 24 06/07/2020 1045   CREATININE 0.90 09/09/2020 1128   CREATININE 1.16 (H) 09/06/2020 0754   CREATININE 1.49 (H) 08/13/2016 1556      Component Value Date/Time   CALCIUM 8.5 (L) 09/09/2020 1105   ALKPHOS 87 09/09/2020 1105   AST 17 09/09/2020 1105   AST 12 (L) 09/06/2020 0754   ALT 15 09/09/2020 1105   ALT 10 09/06/2020 0754   BILITOT 0.6 09/09/2020 1105   BILITOT 0.4 09/06/2020 0754       RADIOGRAPHIC STUDIES: No results found.  ASSESSMENT AND PLAN: This is a very pleasant 75 years old white female with history of microcytic anemia secondary to  iron deficiency from an adequate oral intake.  The patient has intolerance to the oral iron tablets. She was treated in the past with iron infusion with Venofer 300 Mg IV weekly for 3 weeks during the last 3 weeks of May 2022.   The patient tolerated her iron infusion fairly well. She is here today for evaluation and repeat CBC, iron study and ferritin. Her CBC showed further improvement in her hemoglobin and hematocrit.  Her hemoglobin is 14.6 today.  Iron study and ferritin are still pending I recommended for her to continue on observation with repeat CBC, iron study and ferritin in 4 months. The patient was advised to call immediately if she has any other concerning symptoms in the interval. The patient voices understanding of current disease status and treatment options and is in agreement with the current care plan.  All questions were answered. The patient knows to call the clinic with any problems, questions or concerns. We can certainly see the patient much sooner if necessary.   Disclaimer: This note was dictated with voice recognition software. Similar sounding words can inadvertently be transcribed and may not be corrected upon review.

## 2020-12-20 DIAGNOSIS — N813 Complete uterovaginal prolapse: Secondary | ICD-10-CM | POA: Diagnosis not present

## 2020-12-20 DIAGNOSIS — Z6841 Body Mass Index (BMI) 40.0 and over, adult: Secondary | ICD-10-CM | POA: Diagnosis not present

## 2020-12-20 DIAGNOSIS — K469 Unspecified abdominal hernia without obstruction or gangrene: Secondary | ICD-10-CM | POA: Diagnosis not present

## 2020-12-20 DIAGNOSIS — R8290 Unspecified abnormal findings in urine: Secondary | ICD-10-CM | POA: Diagnosis not present

## 2020-12-20 DIAGNOSIS — N8110 Cystocele, unspecified: Secondary | ICD-10-CM | POA: Diagnosis not present

## 2020-12-20 DIAGNOSIS — K5902 Outlet dysfunction constipation: Secondary | ICD-10-CM | POA: Diagnosis not present

## 2020-12-20 DIAGNOSIS — N816 Rectocele: Secondary | ICD-10-CM | POA: Diagnosis not present

## 2020-12-20 DIAGNOSIS — R82998 Other abnormal findings in urine: Secondary | ICD-10-CM | POA: Diagnosis not present

## 2020-12-21 DIAGNOSIS — M170 Bilateral primary osteoarthritis of knee: Secondary | ICD-10-CM | POA: Diagnosis not present

## 2020-12-21 DIAGNOSIS — M1711 Unilateral primary osteoarthritis, right knee: Secondary | ICD-10-CM | POA: Diagnosis not present

## 2020-12-21 DIAGNOSIS — M1712 Unilateral primary osteoarthritis, left knee: Secondary | ICD-10-CM | POA: Diagnosis not present

## 2020-12-28 DIAGNOSIS — M170 Bilateral primary osteoarthritis of knee: Secondary | ICD-10-CM | POA: Diagnosis not present

## 2020-12-29 DIAGNOSIS — Z23 Encounter for immunization: Secondary | ICD-10-CM | POA: Diagnosis not present

## 2021-01-04 DIAGNOSIS — M170 Bilateral primary osteoarthritis of knee: Secondary | ICD-10-CM | POA: Diagnosis not present

## 2021-01-17 ENCOUNTER — Ambulatory Visit
Admission: RE | Admit: 2021-01-17 | Discharge: 2021-01-17 | Disposition: A | Discharge status: Home / Self Care | Payer: PPO | Source: Ambulatory Visit | Attending: Family Medicine | Admitting: Family Medicine

## 2021-01-17 ENCOUNTER — Other Ambulatory Visit: Payer: Self-pay

## 2021-01-17 DIAGNOSIS — Z1231 Encounter for screening mammogram for malignant neoplasm of breast: Secondary | ICD-10-CM

## 2021-02-04 ENCOUNTER — Other Ambulatory Visit: Payer: Self-pay | Admitting: Nurse Practitioner

## 2021-02-12 DIAGNOSIS — I10 Essential (primary) hypertension: Secondary | ICD-10-CM | POA: Diagnosis not present

## 2021-02-12 DIAGNOSIS — J45909 Unspecified asthma, uncomplicated: Secondary | ICD-10-CM | POA: Diagnosis not present

## 2021-02-12 DIAGNOSIS — N183 Chronic kidney disease, stage 3 unspecified: Secondary | ICD-10-CM | POA: Diagnosis not present

## 2021-02-12 DIAGNOSIS — D508 Other iron deficiency anemias: Secondary | ICD-10-CM | POA: Diagnosis not present

## 2021-02-12 DIAGNOSIS — E039 Hypothyroidism, unspecified: Secondary | ICD-10-CM | POA: Diagnosis not present

## 2021-02-12 DIAGNOSIS — I480 Paroxysmal atrial fibrillation: Secondary | ICD-10-CM | POA: Diagnosis not present

## 2021-02-12 DIAGNOSIS — M1990 Unspecified osteoarthritis, unspecified site: Secondary | ICD-10-CM | POA: Diagnosis not present

## 2021-02-12 DIAGNOSIS — K219 Gastro-esophageal reflux disease without esophagitis: Secondary | ICD-10-CM | POA: Diagnosis not present

## 2021-02-12 DIAGNOSIS — E7800 Pure hypercholesterolemia, unspecified: Secondary | ICD-10-CM | POA: Diagnosis not present

## 2021-02-12 DIAGNOSIS — G43109 Migraine with aura, not intractable, without status migrainosus: Secondary | ICD-10-CM | POA: Diagnosis not present

## 2021-02-14 DIAGNOSIS — R7303 Prediabetes: Secondary | ICD-10-CM | POA: Diagnosis not present

## 2021-02-14 DIAGNOSIS — N816 Rectocele: Secondary | ICD-10-CM | POA: Diagnosis not present

## 2021-02-14 DIAGNOSIS — E7800 Pure hypercholesterolemia, unspecified: Secondary | ICD-10-CM | POA: Diagnosis not present

## 2021-02-14 DIAGNOSIS — D508 Other iron deficiency anemias: Secondary | ICD-10-CM | POA: Diagnosis not present

## 2021-02-14 DIAGNOSIS — E039 Hypothyroidism, unspecified: Secondary | ICD-10-CM | POA: Diagnosis not present

## 2021-02-14 DIAGNOSIS — I10 Essential (primary) hypertension: Secondary | ICD-10-CM | POA: Diagnosis not present

## 2021-02-15 DIAGNOSIS — M170 Bilateral primary osteoarthritis of knee: Secondary | ICD-10-CM | POA: Diagnosis not present

## 2021-02-27 DIAGNOSIS — G4733 Obstructive sleep apnea (adult) (pediatric): Secondary | ICD-10-CM | POA: Diagnosis not present

## 2021-03-05 ENCOUNTER — Other Ambulatory Visit: Payer: Self-pay | Admitting: Cardiovascular Disease

## 2021-03-09 ENCOUNTER — Other Ambulatory Visit: Payer: Self-pay | Admitting: Cardiovascular Disease

## 2021-03-15 ENCOUNTER — Telehealth: Payer: Self-pay | Admitting: Cardiovascular Disease

## 2021-03-15 ENCOUNTER — Other Ambulatory Visit: Payer: Self-pay

## 2021-03-15 MED ORDER — FUROSEMIDE 20 MG PO TABS: ORAL_TABLET | ORAL | 3 refills | Status: DC

## 2021-03-15 NOTE — Telephone Encounter (Signed)
*  STAT* If patient is at the pharmacy, call can be transferred to refill team.   1. Which medications need to be refilled? (please list name of each medication and dose if known) MAGNESIUM-OXIDE 400 (241.3 Mg) MG tablet  2. Which pharmacy/location (including street and city if local pharmacy) is medication to be sent to? [facility] [place], [place] AT [address]  3. Do they need a 30 day or 90 day supply? 90  Patient states that the refill was decline but she doesn't understand why. She explain that over the counter medicine cost too much and she wants it as a prescription

## 2021-03-16 ENCOUNTER — Other Ambulatory Visit: Payer: Self-pay | Admitting: Cardiovascular Disease

## 2021-03-16 NOTE — Telephone Encounter (Signed)
Called patient's pharmacy's to find out why Rx for magnesium-oxide was denied. Pharmacist stated that the lasix was denied and that a new Rx was sent over and that patient may have gotten the two Rx mixed up. I asked if he could refill for patient and he stated that he will and that will be ready in a couple of hours.

## 2021-03-16 NOTE — Telephone Encounter (Signed)
Called patient and informed her that her Rx for Magnesium Oxide will be ready for pick up in a couple of hours.She thanked me for calling and all (if any) questions were answered.

## 2021-03-22 ENCOUNTER — Ambulatory Visit: Payer: PPO | Admitting: Cardiovascular Disease

## 2021-03-22 ENCOUNTER — Other Ambulatory Visit: Payer: Self-pay

## 2021-03-22 ENCOUNTER — Encounter: Payer: Self-pay | Admitting: Cardiovascular Disease

## 2021-03-22 VITALS — BP 136/82 | HR 64 | Resp 20 | Ht 66.0 in | Wt 271.2 lb

## 2021-03-22 DIAGNOSIS — Z7901 Long term (current) use of anticoagulants: Secondary | ICD-10-CM | POA: Diagnosis not present

## 2021-03-22 DIAGNOSIS — I480 Paroxysmal atrial fibrillation: Secondary | ICD-10-CM

## 2021-03-22 DIAGNOSIS — I351 Nonrheumatic aortic (valve) insufficiency: Secondary | ICD-10-CM | POA: Diagnosis not present

## 2021-03-22 DIAGNOSIS — G43109 Migraine with aura, not intractable, without status migrainosus: Secondary | ICD-10-CM

## 2021-03-22 DIAGNOSIS — I10 Essential (primary) hypertension: Secondary | ICD-10-CM

## 2021-03-22 DIAGNOSIS — G4733 Obstructive sleep apnea (adult) (pediatric): Secondary | ICD-10-CM

## 2021-03-22 DIAGNOSIS — I5189 Other ill-defined heart diseases: Secondary | ICD-10-CM

## 2021-03-22 DIAGNOSIS — I340 Nonrheumatic mitral (valve) insufficiency: Secondary | ICD-10-CM | POA: Diagnosis not present

## 2021-03-22 MED ORDER — FUROSEMIDE 20 MG PO TABS: 20.0000 mg | ORAL_TABLET | Freq: Every day | ORAL | 3 refills | Status: DC

## 2021-03-22 NOTE — Progress Notes (Signed)
Cardiology Office Note:    Date:  03/22/2021   ID:  Leslie [name], Leslie [name] 1946/02/05, MRN [id number]  PCP:  Leslie Smoker, MD  Cardiologist:  Sanda Klein, MD    Referring MD: Leslie [name], *   No chief complaint on file.   History of Present Illness:    Leslie [name] is a 75 y.o. female with a hx of Persistent atrial fibrillation and diastolic heart failure returning for follow-up.   She's done quite well and does not think she has had any recent episodes of atrial fibrillation.  She has an apple watch that has not shown atrial fibrillation, but reported some episodes of bradycardia.  We had her wear a event monitor and this showed mild sinus bradycardia, no significant sinus pauses or evidence of AV block.  She has not had syncope.  She denies dizziness.  Her migraines have decreased in frequency and she has more problems with the aura symptoms than she does with headaches.  When her dose of metoprolol was decreased to 50 mg twice daily, her frequency of migraines (aura without headache) increased from twice a year to 4 or 5 times a week.  Both metoprolol and verapamil seem to help with her migraines.  She is compliant with CPAP and she denies any symptoms of daytime hypersomnolence.  She had normal TSH about a month ago.  She has normal renal function with a creatinine of 1.1 on 02/14/2021  Initially presented with persistent atrial fibrillation in April 2018, had a difficult time with rate control and underwent TEE guided cardioversion.  Beta-blocker dose was decreased in May 2018 when she had junctional rhythm.  After she received her second dose of coronavirus vaccine in February 2021 she developed symptomatic persistent atrial fibrillation with RVR, again challenging to rate control and underwent cardioversion on June 24, 2019.  She then had to undergo cardioversion twice in 2022: First in January, with early recurrence of atrial fibrillation after which flecainide  was started;  second cardioversion in February after which she developed pulmonary edema and required brief hospitalization. A nuclear stress test on May 26, 2020 did not show any evidence of ischemia.   Echocardiogram on June 01, 2020 showed LVEF 55-60% with "pseudo normal" mitral inflow and no major valvular abnormalities (mild mitral regurgitation and moderate aortic regurgitation, unchanged from the previous one performed in March 2021.  The left atrium is moderately dilated.  LVEF is normal.  "Pseudonormal" mitral inflow was described, but this was performed relatively soon after her cardioversion0  She has iron deficiency anemia and is receiving iron infusions.  She was intolerant of oral iron due to severe constipation.  Additional problems include morbid obesity, obstructive sleep apnea on CPAP and treated hypertension.     Past Medical History:  Diagnosis Date   Adenomatous polyp    Arthritis    "knees, legs, fingers" (07/30/2016)   Asthma    Bursitis of left shoulder    "just finished PT" (07/30/2016)   Chest pain    a. 2003 Abnl stress test-->Cath: nonobs CAD.   Chronic bronchitis (HCC)    Chronic kidney disease    stage 3   GERD (gastroesophageal reflux disease)    barrett's esophagus- Dr. Cristina [name]   History of hiatal hernia    Hyperlipidemia    Hypertension    Hypothyroidism    Migraines    "sporatic; at least a few/year" (07/30/2016)   Mitral regurgitation    a. 07/2016 Echo: mild to mod  MR;  b. 07/2016 TEE: mild MR.   Moderate aortic insufficiency    a. 07/2016 Echo: EF 55-60%, mod AI;  b. 07/2016 TEE: EF 50-55%, mod AI.   Obesity    s/p lap band surgery 6/10   OSA on CPAP    PAF (paroxysmal atrial fibrillation) ([place])    a. 07/2016 TEE/DCCV: EF 50-55%, mild MR, mod AI, mild to mod TR, neg bubble study-->successful DCCV x1 w/ 120J.   PONV (postoperative nausea and vomiting)    Vitamin D deficiency     Past Surgical History:  Procedure Laterality Date    APPENDECTOMY     incidental   CARDIOVERSION N/A 08/01/2016   Procedure: CARDIOVERSION;  Surgeon: Jerline Pain, MD;  Location: [facility] ENDOSCOPY;  Service: Cardiovascular;  Laterality: N/A;   CARDIOVERSION N/A 06/24/2019   Procedure: CARDIOVERSION;  Surgeon: Sanda Klein, MD;  Location: [place];  Service: Cardiovascular;  Laterality: N/A;   CARDIOVERSION N/A 05/30/2020   Procedure: CARDIOVERSION;  Surgeon: Sanda Klein, MD;  Location: [place] ENDOSCOPY;  Service: Cardiovascular;  Laterality: N/A;   CARDIOVERSION N/A 05/09/2020   Procedure: CARDIOVERSION;  Surgeon: [name], MD;  Location: [place];  Service: Cardiovascular;  Laterality: N/A;   CATARACT EXTRACTION W/ INTRAOCULAR LENS  IMPLANT, BILATERAL Bilateral ~ 2008-2017   left - right   COMBINED HYSTERECTOMY VAGINAL / OOPHORECTOMY / A&P [place]  09/2008   TEE WITHOUT CARDIOVERSION N/A 08/01/2016   Procedure: TRANSESOPHAGEAL ECHOCARDIOGRAM (TEE);  Surgeon: Jerline Pain, MD;  Location: [place];  Service: Cardiovascular;  Laterality: N/A;   [place]   "w/1 ovary"    Current Medications: Current Meds  Medication Sig   acetaminophen (TYLENOL) 650 MG CR tablet Take 1,300 mg by mouth every 8 (eight) hours as needed for pain.   atorvastatin (LIPITOR) 10 MG tablet Take 10 mg by mouth daily.   Bacillus Coagulans-Inulin (ALIGN PREBIOTIC-PROBIOTIC PO) Take 2 capsules by mouth daily. Gummy   cholecalciferol (VITAMIN D) 1000 UNITS tablet Take 1,000 Units by mouth daily.   diclofenac Sodium (VOLTAREN) 1 % GEL Apply 1 application topically 2 (two) times daily.   ELIQUIS 5 MG TABS tablet TAKE 1 TABLET(5 MG) BY MOUTH TWICE DAILY   fexofenadine (ALLEGRA) 180 MG tablet Take 180 mg by mouth daily.   flecainide (TAMBOCOR) 50 MG tablet TAKE 1 TABLET(50 MG) BY  MOUTH TWICE DAILY   fluticasone (FLONASE) 50 MCG/ACT nasal spray Place 2 sprays into both nostrils at bedtime.    fluticasone (FLOVENT HFA) 44 MCG/ACT inhaler Inhale 2 puffs into the lungs 2 (two) times daily. (Patient taking differently: Inhale 2 puffs into the lungs at bedtime.)   Glucosamine-Chondroitin (OSTEO BI-FLEX REGULAR STRENGTH PO) Take 1 tablet by mouth daily.   levothyroxine (SYNTHROID, LEVOTHROID) 50 MCG tablet Take 50 mcg by mouth daily before breakfast.   magnesium oxide (MAG-OX) 400 (240 Mg) MG tablet TAKE 1 TABLET BY MOUTH DAILY   metoprolol succinate (TOPROL-XL) 100 MG 24 hr tablet Take 100 mg (one tablet) in the morning and 50 mg (half a tablet) in the evening.   metroNIDAZOLE (METROCREAM) 0.75 % cream Apply 1 application topically daily.   mometasone (ELOCON) 0.1 % cream Apply 1 application topically 2 (two) times daily as needed (skin irritation).   Multiple Vitamins-Minerals (CENTRUM MULTIGUMMIES) CHEW Chew 1 tablet  by mouth daily.   omeprazole (PRILOSEC) 20 MG capsule Take 40 mg by mouth daily before breakfast.   Psyllium (METAMUCIL) 28.3 % POWD Take 1 Scoop by mouth at bedtime.   senna (SENOKOT) 8.6 MG tablet Take 1 tablet by mouth every evening.   verapamil (CALAN-SR) 180 MG CR tablet Take 1 tablet (180 mg total) by mouth at bedtime.   [DISCONTINUED] furosemide (LASIX) 20 MG tablet TAKE 1 TABLET(20 MG) BY MOUTH DAILY     Allergies:   Clindamycin hcl, Augmentin [amoxicillin-pot clavulanate], Codeine, Demerol [meperidine], and Sulfa antibiotics   Social History   Socioeconomic History   Marital status: Divorced    Spouse name: Not on file   Number of children: Not on file   Years of education: Not on file   Highest education level: Not on file  Occupational History   Not on file  Tobacco Use   Smoking status: Former   Smokeless tobacco: Never   Tobacco comments:    07/30/2016 "social [name] only; in college"  Vaping Use   Vaping Use: Never used  Substance and  Sexual Activity   Alcohol use: Yes    Alcohol/week: 2.0 standard drinks    Types: 1 Glasses of wine, 1 Standard drinks or equivalent per week    Comment: 07/30/2016 "couple drinks/month"   Drug use: No   Sexual activity: Not Currently  Other Topics Concern   Not on file  Social History Narrative   Not on file   Social Determinants of Health   Financial Resource Strain: Not on file  Food Insecurity: Not on file  Transportation Needs: Not on file  Physical Activity: Not on file  Stress: Not on file  Social Connections: Not on file     Family History: The patient's family history includes Emphysema in her father; Heart failure in her mother; Hypertension in her father and mother. ROS:   Please see the history of present illness.   All other systems are reviewed and are negative.   EKGs/Labs/Other Studies Reviewed:     EKG: Ordered today and personally reviewed shows normal sinus rhythm, minor nonspecific ST-T changes, narrow QRS at 90 ms, normal QTC 429 ms  Recent Labs: November 20, 2018 creatinine 1.28, potassium 4.6, normal TSH 08/17/2019 hemoglobin A1c 6%, creatinine 1.17, potassium 4.6 BMET    Component Value Date/Time   NA 139 09/09/2020 1128   NA 137 06/07/2020 1045   K 4.0 09/09/2020 1128   CL 107 09/09/2020 1128   CO2 20 (L) 09/09/2020 1105   GLUCOSE 132 (H) 09/09/2020 1128   BUN 19 09/09/2020 1128   BUN 24 06/07/2020 1045   CREATININE 0.90 09/09/2020 1128   CREATININE 1.16 (H) 09/06/2020 0754   CREATININE 1.49 (H) 08/13/2016 1556   CALCIUM 8.5 (L) 09/09/2020 1105   GFRNONAA 55 (L) 09/09/2020 1105   GFRNONAA 49 (L) 09/06/2020 0754   GFRNONAA 35 (L) 08/13/2016 1556   GFRAA 42 (L) 06/07/2020 1045   GFRAA 41 (L) 08/13/2016 1556     Recent Lipid Panel November 20, 2018 total cholesterol 166, HDL 36, LDL 95, triglycerides 176     Component Value Date/Time   CHOL 146 07/31/2016 0435   TRIG 174 (H) 07/31/2016 0435   HDL 32 (L) 07/31/2016 0435   CHOLHDL 4.6  07/31/2016 0435   VLDL 35 07/31/2016 0435   LDLCALC 79 07/31/2016 0435    Physical Exam:    VS:  BP 136/82 (BP Location: Left Arm, Patient Position: Sitting, Cuff Size:  Normal)   Pulse 64   Resp 20   Ht 5\' 6"  (1.676 m)   Wt 271 lb 3.2 oz (123 kg)   SpO2 97%   BMI 43.77 kg/m     Wt Readings from Last 3 Encounters:  03/22/21 271 lb 3.2 oz (123 kg)  12/13/20 269 lb 12.8 oz (122.4 kg)  12/07/20 270 lb 9.6 oz (122.7 kg)     General: Alert, oriented x3, no distress, morbid obesity Head: no evidence of trauma, PERRL, EOMI, no exophtalmos or lid lag, no myxedema, no xanthelasma; normal ears, nose and oropharynx Neck: normal jugular venous pulsations and no hepatojugular reflux; brisk carotid pulses without delay and no carotid bruits Chest: clear to auscultation, no signs of consolidation by percussion or palpation, normal fremitus, symmetrical and full respiratory excursions Cardiovascular: normal position and quality of the apical impulse, regular rhythm, normal first and second heart sounds, 1/6 aortic ejection murmur, cannot hear an apical holosystolic murmur, no diastolic murmurs, rubs or gallops Abdomen: no tenderness or distention, no masses by palpation, no abnormal pulsatility or arterial bruits, normal bowel sounds, no hepatosplenomegaly Extremities: no clubbing, cyanosis or edema; 2+ radial, ulnar and brachial pulses bilaterally; 2+ right femoral, posterior tibial and dorsalis pedis pulses; 2+ left femoral, posterior tibial and dorsalis pedis pulses; no subclavian or femoral bruits Neurological: grossly nonfocal Psych: Normal mood and affect  ASSESSMENT:    1. Paroxysmal atrial fibrillation (HCC)   2. Long term current use of anticoagulant   3. Essential hypertension   4. Diastolic dysfunction   5. OSA (obstructive sleep apnea)   6. Morbid obesity ([place])   7. Nonrheumatic mitral valve regurgitation   8. Nonrheumatic aortic valve insufficiency   9. Migraine aura without  headache     PLAN:    In order of problems listed above:  AFib: Has had a good response with flecainide, without any recurrent clinical events or need for cardioversion since May 30, 2020.  Also on relatively high doses of beta-blocker and verapamil, which also help with her migraines.  Tolerating flecainide so far without side effects (the episode of heart failure occurred immediately after cardioversion and was likely related to the prolonged period of time spent in atrial fibrillation).  CHADSVasc 3 (age, gender, HTN).  Anticoagulation: Denies bleeding problems. HTN: Well-controlled even off the ARB, currently on verapamil and metoprolol. Diastolic dysfunction: This was described on an echo performed almost immediately after cardioversion and cannot exclude a component of atrial mechanical stunning.  She did have heart failure immediately following the cardioversion in February and probably has some degree of diastolic dysfunction.  Currently appears to be hemodynamically compensated. OSA: Reports 100% compliance with CPAP and she denies daytime hypersomnolence. Obesity: Once again reviewed the direct relationship between weight and burden of atrial fibrillation. MR/AR: Both mild MR and moderate AR have been stable on echocardiograms performed over the last 4 years.  I cannot hear either murmur on physical exam.  Neither one of them appears to be severe enough to cause the arrhythmia or heart failure. Migraines: These do better on higher doses of metoprolol and verapamil.  Medication Adjustments/Labs and Tests Ordered: Current medicines are reviewed at length with the patient today.  Concerns regarding medicines are outlined above.  Orders Placed This Encounter  Procedures   EKG 12-Lead    Meds ordered this encounter  Medications   furosemide (LASIX) 20 MG tablet    Sig: Take 1 tablet (20 mg total) by mouth daily.    Dispense:  90 tablet    Refill:  3    Patient Instructions   Medication Instructions:  No changes *If you need a refill on your cardiac medications before your next appointment, please call your pharmacy*   Lab Work: None ordered If you have labs (blood work) drawn today and your tests are completely normal, you will receive your results only by: [place] (if you have MyChart) OR A paper copy in the mail If you have any lab test that is abnormal or we need to change your treatment, we will call you to review the results.   Testing/Procedures: None ordered   Follow-Up: At [facility], you and your health needs are our priority.  As part of our continuing mission to provide you with exceptional heart care, we have created designated Provider Care Teams.  These Care Teams include your primary Cardiologist (physician) and Advanced Practice Providers (APPs -  Physician Assistants and Nurse Practitioners) who all work together to provide you with the care you need, when you need it.  We recommend signing up for the patient portal called "MyChart".  Sign up information is provided on this After Visit Summary.  MyChart is used to connect with patients for Virtual Visits (Telemedicine).  Patients are able to view lab/test results, encounter notes, upcoming appointments, etc.  Non-urgent messages can be sent to your provider as well.   To learn more about what you can do with MyChart, go to [email or web address].    Your next appointment:   6 month(s)  The format for your next appointment:   In Person  Provider:   Sanda Klein, MD      Signed, Sanda Klein, MD  03/22/2021 1:19 PM    [place]

## 2021-03-22 NOTE — Patient Instructions (Signed)

## 2021-03-26 DIAGNOSIS — B372 Candidiasis of skin and nail: Secondary | ICD-10-CM | POA: Diagnosis not present

## 2021-03-28 DIAGNOSIS — E039 Hypothyroidism, unspecified: Secondary | ICD-10-CM | POA: Diagnosis not present

## 2021-03-28 DIAGNOSIS — E7800 Pure hypercholesterolemia, unspecified: Secondary | ICD-10-CM | POA: Diagnosis not present

## 2021-03-28 DIAGNOSIS — J45909 Unspecified asthma, uncomplicated: Secondary | ICD-10-CM | POA: Diagnosis not present

## 2021-03-28 DIAGNOSIS — I10 Essential (primary) hypertension: Secondary | ICD-10-CM | POA: Diagnosis not present

## 2021-03-28 DIAGNOSIS — N183 Chronic kidney disease, stage 3 unspecified: Secondary | ICD-10-CM | POA: Diagnosis not present

## 2021-03-28 DIAGNOSIS — G43109 Migraine with aura, not intractable, without status migrainosus: Secondary | ICD-10-CM | POA: Diagnosis not present

## 2021-03-28 DIAGNOSIS — D508 Other iron deficiency anemias: Secondary | ICD-10-CM | POA: Diagnosis not present

## 2021-03-28 DIAGNOSIS — K219 Gastro-esophageal reflux disease without esophagitis: Secondary | ICD-10-CM | POA: Diagnosis not present

## 2021-03-28 DIAGNOSIS — I480 Paroxysmal atrial fibrillation: Secondary | ICD-10-CM | POA: Diagnosis not present

## 2021-03-28 DIAGNOSIS — M1990 Unspecified osteoarthritis, unspecified site: Secondary | ICD-10-CM | POA: Diagnosis not present

## 2021-03-29 DIAGNOSIS — M25572 Pain in left ankle and joints of left foot: Secondary | ICD-10-CM | POA: Diagnosis not present

## 2021-04-03 DIAGNOSIS — Z09 Encounter for follow-up examination after completed treatment for conditions other than malignant neoplasm: Secondary | ICD-10-CM | POA: Diagnosis not present

## 2021-04-03 DIAGNOSIS — Z8619 Personal history of other infectious and parasitic diseases: Secondary | ICD-10-CM | POA: Diagnosis not present

## 2021-04-10 ENCOUNTER — Other Ambulatory Visit: Payer: PPO

## 2021-04-10 ENCOUNTER — Ambulatory Visit: Payer: PPO | Admitting: Internal Medicine

## 2021-04-23 ENCOUNTER — Other Ambulatory Visit: Payer: Self-pay | Admitting: Internal Medicine

## 2021-04-23 DIAGNOSIS — D508 Other iron deficiency anemias: Secondary | ICD-10-CM

## 2021-04-24 ENCOUNTER — Inpatient Hospital Stay: Payer: PPO | Admitting: Internal Medicine

## 2021-04-24 ENCOUNTER — Other Ambulatory Visit: Payer: Self-pay

## 2021-04-24 ENCOUNTER — Encounter: Payer: Self-pay | Admitting: Physician Assistant

## 2021-04-24 ENCOUNTER — Inpatient Hospital Stay: Payer: PPO | Attending: Internal Medicine

## 2021-04-24 ENCOUNTER — Encounter: Payer: Self-pay | Admitting: Internal Medicine

## 2021-04-24 VITALS — BP 136/52 | HR 63 | Temp 97.8°F | Resp 17 | Wt 274.0 lb

## 2021-04-24 DIAGNOSIS — D509 Iron deficiency anemia, unspecified: Secondary | ICD-10-CM | POA: Diagnosis not present

## 2021-04-24 DIAGNOSIS — D508 Other iron deficiency anemias: Secondary | ICD-10-CM

## 2021-04-24 DIAGNOSIS — D500 Iron deficiency anemia secondary to blood loss (chronic): Secondary | ICD-10-CM

## 2021-04-24 LAB — CBC WITH DIFFERENTIAL (CANCER CENTER ONLY)
Abs Immature Granulocytes: 0.02 10*3/uL (ref 0.00–0.07)
Basophils Absolute: 0.1 10*3/uL (ref 0.0–0.1)
Basophils Relative: 1 %
Eosinophils Absolute: 0.1 10*3/uL (ref 0.0–0.5)
Eosinophils Relative: 1 %
HCT: 42.4 % (ref 36.0–46.0)
Hemoglobin: 13.7 g/dL (ref 12.0–15.0)
Immature Granulocytes: 0 %
Lymphocytes Relative: 18 %
Lymphs Abs: 2.3 10*3/uL (ref 0.7–4.0)
MCH: 27.5 pg (ref 26.0–34.0)
MCHC: 32.3 g/dL (ref 30.0–36.0)
MCV: 85.1 fL (ref 80.0–100.0)
Monocytes Absolute: 0.8 10*3/uL (ref 0.1–1.0)
Monocytes Relative: 7 %
Neutro Abs: 9.3 10*3/uL — ABNORMAL HIGH (ref 1.7–7.7)
Neutrophils Relative %: 73 %
Platelet Count: 293 10*3/uL (ref 150–400)
RBC: 4.98 MIL/uL (ref 3.87–5.11)
RDW: 14 % (ref 11.5–15.5)
WBC Count: 12.6 10*3/uL — ABNORMAL HIGH (ref 4.0–10.5)
nRBC: 0 % (ref 0.0–0.2)

## 2021-04-24 LAB — IRON AND IRON BINDING CAPACITY (CC-WL,HP ONLY)
Iron: 62 ug/dL (ref 28–170)
Saturation Ratios: 15 % (ref 10.4–31.8)
TIBC: 427 ug/dL (ref 250–450)
UIBC: 365 ug/dL (ref 148–442)

## 2021-04-24 NOTE — Progress Notes (Signed)
[place] [phone]   Fax:(336) [phone]  OFFICE PROGRESS NOTE  Glenis Smoker, MD [place] [address]  DIAGNOSIS: Iron deficiency anemia.  PRIOR THERAPY: Iron infusion with Venofer 300 Mg IV every week x3 weeks.  Last dose was given September 13, 2020  CURRENT THERAPY: None  INTERVAL HISTORY: Leslie [name] 76 y.o. female returns to the clinic today for follow-up visit.  The patient is feeling fine today with no concerning complaints.  She denied having any fatigue or weakness.  She denied having any nausea, vomiting, diarrhea or constipation.  She has no headache or visual changes.  She denied having any bleeding, bruises or ecchymosis except for recent one episode of epistaxis.  She is currently on Flonase.  She denied having any current weight loss or night sweats.  The patient is here today for evaluation and repeat blood work.  MEDICAL HISTORY: Past Medical History:  Diagnosis Date   Adenomatous polyp    Arthritis    "knees, legs, fingers" (07/30/2016)   Asthma    Bursitis of left shoulder    "just finished PT" (07/30/2016)   Chest pain    a. 2003 Abnl stress test-->Cath: nonobs CAD.   Chronic bronchitis (HCC)    Chronic kidney disease    stage 3   GERD (gastroesophageal reflux disease)    barrett's esophagus- Dr. Cristina Gong   History of hiatal hernia    Hyperlipidemia    Hypertension    Hypothyroidism    Migraines    "sporatic; at least a few/year" (07/30/2016)   Mitral regurgitation    a. 07/2016 Echo: mild to mod MR;  b. 07/2016 TEE: mild MR.   Moderate aortic insufficiency    a. 07/2016 Echo: EF 55-60%, mod AI;  b. 07/2016 TEE: EF 50-55%, mod AI.   Obesity    s/p lap band surgery 6/10   OSA on CPAP    PAF (paroxysmal atrial fibrillation) ([place])    a. 07/2016 TEE/DCCV: EF 50-55%, mild MR, mod AI, mild to mod TR, neg bubble study-->successful DCCV x1 w/ 120J.   PONV (postoperative nausea and vomiting)     Vitamin D deficiency     ALLERGIES:  is allergic to clindamycin hcl, augmentin [amoxicillin-pot clavulanate], codeine, demerol [meperidine], and sulfa antibiotics.  MEDICATIONS:  Current Outpatient Medications  Medication Sig Dispense Refill   acetaminophen (TYLENOL) 650 MG CR tablet Take 1,300 mg by mouth every 8 (eight) hours as needed for pain.     atorvastatin (LIPITOR) 10 MG tablet Take 10 mg by mouth daily.     Bacillus Coagulans-Inulin (ALIGN PREBIOTIC-PROBIOTIC PO) Take 2 capsules by mouth daily. Gummy     cholecalciferol (VITAMIN D) 1000 UNITS tablet Take 1,000 Units by mouth daily.     diclofenac Sodium (VOLTAREN) 1 % GEL Apply 1 application topically 2 (two) times daily.     ELIQUIS 5 MG TABS tablet TAKE 1 TABLET(5 MG) BY MOUTH TWICE DAILY 180 tablet 1   fexofenadine (ALLEGRA) 180 MG tablet Take 180 mg by mouth daily.     flecainide (TAMBOCOR) 50 MG tablet TAKE 1 TABLET(50 MG) BY MOUTH TWICE DAILY 180 tablet 3   fluticasone (FLONASE) 50 MCG/ACT nasal spray Place 2 sprays into both nostrils at bedtime.      fluticasone (FLOVENT HFA) 44 MCG/ACT inhaler Inhale 2 puffs into the lungs 2 (two) times daily. (Patient taking differently: Inhale 2 puffs into the lungs at bedtime.) 1 Inhaler 0  furosemide (LASIX) 20 MG tablet Take 1 tablet (20 mg total) by mouth daily. 90 tablet 3   Glucosamine-Chondroitin (OSTEO BI-FLEX REGULAR STRENGTH PO) Take 1 tablet by mouth daily.     levothyroxine (SYNTHROID, LEVOTHROID) 50 MCG tablet Take 50 mcg by mouth daily before breakfast.     magnesium oxide (MAG-OX) 400 (240 Mg) MG tablet TAKE 1 TABLET BY MOUTH DAILY 90 tablet 0   metoprolol succinate (TOPROL-XL) 100 MG 24 hr tablet Take 100 mg (one tablet) in the morning and 50 mg (half a tablet) in the evening. 135 tablet 3   metroNIDAZOLE (METROCREAM) 0.75 % cream Apply 1 application topically daily.     mometasone (ELOCON) 0.1 % cream Apply 1 application topically 2 (two) times daily as needed (skin  irritation).     Multiple Vitamins-Minerals (CENTRUM MULTIGUMMIES) CHEW Chew 1 tablet by mouth daily.     omeprazole (PRILOSEC) 20 MG capsule Take 40 mg by mouth daily before breakfast.     Psyllium (METAMUCIL) 28.3 % POWD Take 1 Scoop by mouth at bedtime.     senna (SENOKOT) 8.6 MG tablet Take 1 tablet by mouth every evening.     verapamil (CALAN-SR) 180 MG CR tablet Take 1 tablet (180 mg total) by mouth at bedtime. 90 tablet 3   No current facility-administered medications for this visit.    SURGICAL HISTORY:  Past Surgical History:  Procedure Laterality Date   APPENDECTOMY     incidental   CARDIOVERSION N/A 08/01/2016   Procedure: CARDIOVERSION;  Surgeon: Jerline Pain, MD;  Location: [place];  Service: Cardiovascular;  Laterality: N/A;   CARDIOVERSION N/A 06/24/2019   Procedure: CARDIOVERSION;  Surgeon: Sanda Klein, MD;  Location: [place];  Service: Cardiovascular;  Laterality: N/A;   CARDIOVERSION N/A 05/30/2020   Procedure: CARDIOVERSION;  Surgeon: Sanda Klein, MD;  Location: [place] ENDOSCOPY;  Service: Cardiovascular;  Laterality: N/A;   CARDIOVERSION N/A 05/09/2020   Procedure: CARDIOVERSION;  Surgeon: [name], MD;  Location: [place];  Service: Cardiovascular;  Laterality: N/A;   CATARACT EXTRACTION W/ INTRAOCULAR LENS  IMPLANT, BILATERAL Bilateral ~ 2008-2017   left - right   COMBINED HYSTERECTOMY VAGINAL / OOPHORECTOMY / A&P [place]  09/2008   TEE WITHOUT CARDIOVERSION N/A 08/01/2016   Procedure: TRANSESOPHAGEAL ECHOCARDIOGRAM (TEE);  Surgeon: Jerline Pain, MD;  Location[facility] ENDOSCOPY;  Service: Cardiovascular;  Laterality: N/A;   [place]   "w/1 ovary"    REVIEW OF SYSTEMS:  A comprehensive review of systems was negative.   PHYSICAL EXAMINATION: General appearance:  alert, cooperative, and no distress Head: Normocephalic, without obvious abnormality, atraumatic Neck: no adenopathy, no JVD, supple, symmetrical, trachea midline, and thyroid not enlarged, symmetric, no tenderness/mass/nodules Lymph nodes: Cervical, supraclavicular, and axillary nodes normal. Resp: clear to auscultation bilaterally Back: symmetric, no curvature. ROM normal. No CVA tenderness. Cardio: regular rate and rhythm, S1, S2 normal, no murmur, click, rub or gallop GI: soft, non-tender; bowel sounds normal; no masses,  no organomegaly Extremities: extremities normal, atraumatic, no cyanosis or edema  ECOG PERFORMANCE STATUS: 0 - Asymptomatic  Blood pressure (!) 136/52, pulse 63, temperature 97.8 F (36.6 C), temperature source Tympanic, resp. rate 17, weight 274 lb (124.3 kg), SpO2 98 %.  LABORATORY DATA: Lab Results  Component Value Date   WBC 8.9 12/13/2020  HGB 14.6 12/13/2020   HCT 45.3 12/13/2020   MCV 83.6 12/13/2020   PLT 267 12/13/2020      Chemistry      Component Value Date/Time   NA 139 09/09/2020 1128   NA 137 06/07/2020 1045   K 4.0 09/09/2020 1128   CL 107 09/09/2020 1128   CO2 20 (L) 09/09/2020 1105   BUN 19 09/09/2020 1128   BUN 24 06/07/2020 1045   CREATININE 0.90 09/09/2020 1128   CREATININE 1.16 (H) 09/06/2020 0754   CREATININE 1.49 (H) 08/13/2016 1556      Component Value Date/Time   CALCIUM 8.5 (L) 09/09/2020 1105   ALKPHOS 87 09/09/2020 1105   AST 17 09/09/2020 1105   AST 12 (L) 09/06/2020 0754   ALT 15 09/09/2020 1105   ALT 10 09/06/2020 0754   BILITOT 0.6 09/09/2020 1105   BILITOT 0.4 09/06/2020 0754       RADIOGRAPHIC STUDIES: No results found.  ASSESSMENT AND PLAN: This is a very pleasant 76 years old white female with history of microcytic anemia secondary to iron deficiency from an adequate oral intake.  The patient has intolerance to the oral iron tablets. She was treated in the past with iron infusion with Venofer 300 Mg IV  weekly for 3 weeks during the last 3 weeks of May 2022.   The patient is currently on observation and she is feeling fine with no concerning complaints.  She had a repeat CBC today that showed normal hemoglobin and hematocrit with a slightly elevated white blood count secondary to her treatment with Flonase. Her iron studies normal but ferritin level is still pending. I recommended for the patient to continue on observation.  She would like to continue her follow-up by her primary care physician at this point and come to the clinic only if she has concerning findings on her blood work. I agreed with her plan.  I will see her on as-needed basis. She was advised to call immediately if she has any other concerning issues. The patient voices understanding of current disease status and treatment options and is in agreement with the current care plan.  All questions were answered. The patient knows to call the clinic with any problems, questions or concerns. We can certainly see the patient much sooner if necessary.   Disclaimer: This note was dictated with voice recognition software. Similar sounding words can inadvertently be transcribed and may not be corrected upon review.

## 2021-04-25 ENCOUNTER — Encounter: Payer: Self-pay | Admitting: Physician Assistant

## 2021-04-25 LAB — FERRITIN: Ferritin: 12 ng/mL (ref 11–307)

## 2021-04-26 DIAGNOSIS — D6869 Other thrombophilia: Secondary | ICD-10-CM | POA: Diagnosis not present

## 2021-04-26 DIAGNOSIS — I4819 Other persistent atrial fibrillation: Secondary | ICD-10-CM | POA: Diagnosis not present

## 2021-04-26 DIAGNOSIS — Z7901 Long term (current) use of anticoagulants: Secondary | ICD-10-CM | POA: Diagnosis not present

## 2021-04-26 DIAGNOSIS — Z6841 Body Mass Index (BMI) 40.0 and over, adult: Secondary | ICD-10-CM | POA: Diagnosis not present

## 2021-04-30 DIAGNOSIS — E039 Hypothyroidism, unspecified: Secondary | ICD-10-CM | POA: Diagnosis not present

## 2021-04-30 DIAGNOSIS — D508 Other iron deficiency anemias: Secondary | ICD-10-CM | POA: Diagnosis not present

## 2021-04-30 DIAGNOSIS — I10 Essential (primary) hypertension: Secondary | ICD-10-CM | POA: Diagnosis not present

## 2021-04-30 DIAGNOSIS — E7800 Pure hypercholesterolemia, unspecified: Secondary | ICD-10-CM | POA: Diagnosis not present

## 2021-04-30 DIAGNOSIS — N183 Chronic kidney disease, stage 3 unspecified: Secondary | ICD-10-CM | POA: Diagnosis not present

## 2021-04-30 DIAGNOSIS — J4521 Mild intermittent asthma with (acute) exacerbation: Secondary | ICD-10-CM | POA: Diagnosis not present

## 2021-04-30 DIAGNOSIS — G43109 Migraine with aura, not intractable, without status migrainosus: Secondary | ICD-10-CM | POA: Diagnosis not present

## 2021-04-30 DIAGNOSIS — M1990 Unspecified osteoarthritis, unspecified site: Secondary | ICD-10-CM | POA: Diagnosis not present

## 2021-04-30 DIAGNOSIS — I480 Paroxysmal atrial fibrillation: Secondary | ICD-10-CM | POA: Diagnosis not present

## 2021-04-30 DIAGNOSIS — K219 Gastro-esophageal reflux disease without esophagitis: Secondary | ICD-10-CM | POA: Diagnosis not present

## 2021-05-11 DIAGNOSIS — J4521 Mild intermittent asthma with (acute) exacerbation: Secondary | ICD-10-CM | POA: Diagnosis not present

## 2021-05-29 DIAGNOSIS — G4733 Obstructive sleep apnea (adult) (pediatric): Secondary | ICD-10-CM | POA: Diagnosis not present

## 2021-06-01 DIAGNOSIS — K219 Gastro-esophageal reflux disease without esophagitis: Secondary | ICD-10-CM | POA: Diagnosis not present

## 2021-06-01 DIAGNOSIS — E7800 Pure hypercholesterolemia, unspecified: Secondary | ICD-10-CM | POA: Diagnosis not present

## 2021-06-01 DIAGNOSIS — I480 Paroxysmal atrial fibrillation: Secondary | ICD-10-CM | POA: Diagnosis not present

## 2021-06-01 DIAGNOSIS — N183 Chronic kidney disease, stage 3 unspecified: Secondary | ICD-10-CM | POA: Diagnosis not present

## 2021-06-01 DIAGNOSIS — J45909 Unspecified asthma, uncomplicated: Secondary | ICD-10-CM | POA: Diagnosis not present

## 2021-06-01 DIAGNOSIS — I10 Essential (primary) hypertension: Secondary | ICD-10-CM | POA: Diagnosis not present

## 2021-06-06 ENCOUNTER — Other Ambulatory Visit: Payer: Self-pay | Admitting: Cardiovascular Disease

## 2021-06-07 ENCOUNTER — Telehealth: Payer: Self-pay | Admitting: Cardiovascular Disease

## 2021-06-07 MED ORDER — MAGNESIUM OXIDE -MG SUPPLEMENT 400 (240 MG) MG PO TABS: 1.0000 | ORAL_TABLET | Freq: Every day | ORAL | 3 refills | Status: DC

## 2021-06-07 NOTE — Telephone Encounter (Signed)
°*  STAT* If patient is at the pharmacy, call can be transferred to refill team.   1. Which medications need to be refilled? (please list name of each medication and dose if known) magnesium oxide (MAG-OX) 400 (240 Mg) MG tablet  2. Which pharmacy/location (including street and city if local pharmacy) is medication to be sent to? [facility] [place], [address] AT [address] [place] [address]  3. Do they need a 30 day or 90 day supply? 90  (Patient ask that the prescription be good for a year so that she doesn't have to keep calling every 90 days)

## 2021-06-07 NOTE — Telephone Encounter (Signed)
Spoke to patient . Informed her  prescription  refilled for 90 days with 3 refill.

## 2021-07-05 ENCOUNTER — Telehealth: Payer: Self-pay | Admitting: Cardiovascular Disease

## 2021-07-05 ENCOUNTER — Other Ambulatory Visit: Payer: Self-pay

## 2021-07-05 MED ORDER — APIXABAN 5 MG PO TABS: ORAL_TABLET | ORAL | 1 refills | Status: DC

## 2021-07-05 NOTE — Telephone Encounter (Signed)
? ?*  STAT* If patient is at the pharmacy, call can be transferred to refill team. ? ? ?1. Which medications need to be refilled? (please list name of each medication and dose if known)  ? ELIQUIS 5 MG TABS tablet  ? ?2. Which pharmacy/location (including street and city if local pharmacy) is medication to be sent to? [id number] - [place], [address] AT [place] [address] [place] ? ?3. Do they need a 30 day or 90 day supply? 90 days ? ?Pt is out of meds needs refill today. Pt called Afib clinic and was told she's been transferred back to Dr. Loletha Grayer and he will continue her refills ?

## 2021-07-05 NOTE — Telephone Encounter (Signed)
Prescription refill request for Eliquis received. ?Indication:Afib ?Last office visit:12/22 ?Scr:1.1 ?Age: 76 ?Weight:124.3 kg ? ?Prescription refilled ? ?

## 2021-07-11 DIAGNOSIS — D2372 Other benign neoplasm of skin of left lower limb, including hip: Secondary | ICD-10-CM | POA: Diagnosis not present

## 2021-07-11 DIAGNOSIS — D225 Melanocytic nevi of trunk: Secondary | ICD-10-CM | POA: Diagnosis not present

## 2021-07-11 DIAGNOSIS — L738 Other specified follicular disorders: Secondary | ICD-10-CM | POA: Diagnosis not present

## 2021-07-11 DIAGNOSIS — D1801 Hemangioma of skin and subcutaneous tissue: Secondary | ICD-10-CM | POA: Diagnosis not present

## 2021-07-11 DIAGNOSIS — L819 Disorder of pigmentation, unspecified: Secondary | ICD-10-CM | POA: Diagnosis not present

## 2021-07-11 DIAGNOSIS — L821 Other seborrheic keratosis: Secondary | ICD-10-CM | POA: Diagnosis not present

## 2021-07-11 DIAGNOSIS — L718 Other rosacea: Secondary | ICD-10-CM | POA: Diagnosis not present

## 2021-07-11 DIAGNOSIS — L820 Inflamed seborrheic keratosis: Secondary | ICD-10-CM | POA: Diagnosis not present

## 2021-07-19 ENCOUNTER — Other Ambulatory Visit: Payer: Self-pay | Admitting: Dermatology

## 2021-07-19 DIAGNOSIS — N6320 Unspecified lump in the left breast, unspecified quadrant: Secondary | ICD-10-CM

## 2021-08-07 ENCOUNTER — Ambulatory Visit: Payer: PPO | Admitting: Cardiovascular Disease

## 2021-08-09 DIAGNOSIS — E7800 Pure hypercholesterolemia, unspecified: Secondary | ICD-10-CM | POA: Diagnosis not present

## 2021-08-09 DIAGNOSIS — J4521 Mild intermittent asthma with (acute) exacerbation: Secondary | ICD-10-CM | POA: Diagnosis not present

## 2021-08-09 DIAGNOSIS — I10 Essential (primary) hypertension: Secondary | ICD-10-CM | POA: Diagnosis not present

## 2021-08-09 DIAGNOSIS — E039 Hypothyroidism, unspecified: Secondary | ICD-10-CM | POA: Diagnosis not present

## 2021-08-09 DIAGNOSIS — K219 Gastro-esophageal reflux disease without esophagitis: Secondary | ICD-10-CM | POA: Diagnosis not present

## 2021-08-09 DIAGNOSIS — N183 Chronic kidney disease, stage 3 unspecified: Secondary | ICD-10-CM | POA: Diagnosis not present

## 2021-08-09 DIAGNOSIS — I480 Paroxysmal atrial fibrillation: Secondary | ICD-10-CM | POA: Diagnosis not present

## 2021-08-15 ENCOUNTER — Ambulatory Visit
Admission: RE | Admit: 2021-08-15 | Discharge: 2021-08-15 | Disposition: A | Discharge status: Home / Self Care | Payer: PPO | Source: Ambulatory Visit | Attending: Dermatology | Admitting: Dermatology

## 2021-08-15 ENCOUNTER — Other Ambulatory Visit: Payer: Self-pay | Admitting: Dermatology

## 2021-08-15 DIAGNOSIS — N6320 Unspecified lump in the left breast, unspecified quadrant: Secondary | ICD-10-CM

## 2021-08-15 DIAGNOSIS — R2232 Localized swelling, mass and lump, left upper limb: Secondary | ICD-10-CM

## 2021-08-15 DIAGNOSIS — N6002 Solitary cyst of left breast: Secondary | ICD-10-CM | POA: Diagnosis not present

## 2021-08-15 DIAGNOSIS — R922 Inconclusive mammogram: Secondary | ICD-10-CM | POA: Diagnosis not present

## 2021-08-30 DIAGNOSIS — G4733 Obstructive sleep apnea (adult) (pediatric): Secondary | ICD-10-CM | POA: Diagnosis not present

## 2021-09-04 DIAGNOSIS — Z79899 Other long term (current) drug therapy: Secondary | ICD-10-CM | POA: Diagnosis not present

## 2021-09-04 DIAGNOSIS — J45909 Unspecified asthma, uncomplicated: Secondary | ICD-10-CM | POA: Diagnosis not present

## 2021-09-04 DIAGNOSIS — I4819 Other persistent atrial fibrillation: Secondary | ICD-10-CM | POA: Diagnosis not present

## 2021-09-04 DIAGNOSIS — D508 Other iron deficiency anemias: Secondary | ICD-10-CM | POA: Diagnosis not present

## 2021-09-04 DIAGNOSIS — N183 Chronic kidney disease, stage 3 unspecified: Secondary | ICD-10-CM | POA: Diagnosis not present

## 2021-09-04 DIAGNOSIS — E7800 Pure hypercholesterolemia, unspecified: Secondary | ICD-10-CM | POA: Diagnosis not present

## 2021-09-04 DIAGNOSIS — D6869 Other thrombophilia: Secondary | ICD-10-CM | POA: Diagnosis not present

## 2021-09-04 DIAGNOSIS — Z0000 Encounter for general adult medical examination without abnormal findings: Secondary | ICD-10-CM | POA: Diagnosis not present

## 2021-09-04 DIAGNOSIS — I10 Essential (primary) hypertension: Secondary | ICD-10-CM | POA: Diagnosis not present

## 2021-09-04 DIAGNOSIS — Z23 Encounter for immunization: Secondary | ICD-10-CM | POA: Diagnosis not present

## 2021-09-04 DIAGNOSIS — E039 Hypothyroidism, unspecified: Secondary | ICD-10-CM | POA: Diagnosis not present

## 2021-09-04 DIAGNOSIS — J301 Allergic rhinitis due to pollen: Secondary | ICD-10-CM | POA: Diagnosis not present

## 2021-09-04 DIAGNOSIS — R7303 Prediabetes: Secondary | ICD-10-CM | POA: Diagnosis not present

## 2021-09-04 DIAGNOSIS — E559 Vitamin D deficiency, unspecified: Secondary | ICD-10-CM | POA: Diagnosis not present

## 2021-09-07 ENCOUNTER — Other Ambulatory Visit: Payer: Self-pay | Admitting: Cardiovascular Disease

## 2021-09-07 DIAGNOSIS — K219 Gastro-esophageal reflux disease without esophagitis: Secondary | ICD-10-CM | POA: Diagnosis not present

## 2021-09-07 DIAGNOSIS — K2270 Barrett's esophagus without dysplasia: Secondary | ICD-10-CM | POA: Diagnosis not present

## 2021-09-07 DIAGNOSIS — Z86010 Personal history of colonic polyps: Secondary | ICD-10-CM | POA: Diagnosis not present

## 2021-09-07 DIAGNOSIS — N816 Rectocele: Secondary | ICD-10-CM | POA: Diagnosis not present

## 2021-10-04 ENCOUNTER — Other Ambulatory Visit: Payer: Self-pay | Admitting: Cardiovascular Disease

## 2021-10-08 DIAGNOSIS — I10 Essential (primary) hypertension: Secondary | ICD-10-CM | POA: Diagnosis not present

## 2021-10-08 DIAGNOSIS — E7800 Pure hypercholesterolemia, unspecified: Secondary | ICD-10-CM | POA: Diagnosis not present

## 2021-10-08 DIAGNOSIS — I480 Paroxysmal atrial fibrillation: Secondary | ICD-10-CM | POA: Diagnosis not present

## 2021-10-08 DIAGNOSIS — J45909 Unspecified asthma, uncomplicated: Secondary | ICD-10-CM | POA: Diagnosis not present

## 2021-10-08 DIAGNOSIS — D508 Other iron deficiency anemias: Secondary | ICD-10-CM | POA: Diagnosis not present

## 2021-10-08 DIAGNOSIS — K219 Gastro-esophageal reflux disease without esophagitis: Secondary | ICD-10-CM | POA: Diagnosis not present

## 2021-10-08 DIAGNOSIS — N183 Chronic kidney disease, stage 3 unspecified: Secondary | ICD-10-CM | POA: Diagnosis not present

## 2021-10-08 DIAGNOSIS — M1990 Unspecified osteoarthritis, unspecified site: Secondary | ICD-10-CM | POA: Diagnosis not present

## 2021-10-12 ENCOUNTER — Telehealth: Payer: Self-pay | Admitting: Cardiovascular Disease

## 2021-10-12 MED ORDER — METOPROLOL SUCCINATE ER 100 MG PO TB24
ORAL_TABLET | ORAL | 3 refills | Status: DC
Start: 2021-10-12 — End: 2022-04-18

## 2021-10-12 NOTE — Telephone Encounter (Signed)
*  STAT* If patient is at the pharmacy, call can be transferred to refill team.   1. Which medications need to be refilled? (please list name of each medication and dose if known) metoprolol succinate (TOPROL-XL) 100 MG 24 hr tablet  2. Which pharmacy/location (including street and city if local pharmacy) is medication to be sent to? [facility] [place], [address] AT [address] [place] [address]  3. Do they need a 30 day or 90 day supply? 39    Pt states that she went to the pharmacy today and they say they have not received it and it's been going on for 3 weeks now. Informed the pt that on my end, the medication was confirmed by pharmacy 09/26/21, informed her that I will send to our refill team again. She also has an upcoming visit 10/25/21 with Dr. Sallyanne Kuster at 11:00am.

## 2021-10-19 DIAGNOSIS — N8110 Cystocele, unspecified: Secondary | ICD-10-CM | POA: Diagnosis not present

## 2021-10-19 DIAGNOSIS — I480 Paroxysmal atrial fibrillation: Secondary | ICD-10-CM | POA: Diagnosis not present

## 2021-10-19 DIAGNOSIS — M1990 Unspecified osteoarthritis, unspecified site: Secondary | ICD-10-CM | POA: Diagnosis not present

## 2021-10-25 ENCOUNTER — Encounter: Payer: Self-pay | Admitting: Cardiovascular Disease

## 2021-10-25 ENCOUNTER — Ambulatory Visit: Payer: PPO | Admitting: Cardiovascular Disease

## 2021-10-25 VITALS — BP 143/70 | HR 62 | Ht 65.5 in | Wt 273.4 lb

## 2021-10-25 DIAGNOSIS — I5189 Other ill-defined heart diseases: Secondary | ICD-10-CM | POA: Insufficient documentation

## 2021-10-25 DIAGNOSIS — D6869 Other thrombophilia: Secondary | ICD-10-CM | POA: Diagnosis not present

## 2021-10-25 DIAGNOSIS — I480 Paroxysmal atrial fibrillation: Secondary | ICD-10-CM | POA: Diagnosis not present

## 2021-10-25 DIAGNOSIS — I10 Essential (primary) hypertension: Secondary | ICD-10-CM | POA: Diagnosis not present

## 2021-10-25 DIAGNOSIS — G4733 Obstructive sleep apnea (adult) (pediatric): Secondary | ICD-10-CM

## 2021-10-25 DIAGNOSIS — I4819 Other persistent atrial fibrillation: Secondary | ICD-10-CM | POA: Insufficient documentation

## 2021-10-25 NOTE — Progress Notes (Signed)
Cardiology Office Note:    Date:  10/25/2021   ID:  Leslie, [name] [birth year], MRN [id number]  PCP:  Glenis Smoker, MD  Cardiologist:  Sanda Klein, MD    Referring MD: Glenis Smoker, *   Chief Complaint  Patient presents with   Atrial Fibrillation     History of Present Illness:    Leslie [name] is a 76 y.o. female with a hx of Persistent atrial fibrillation and diastolic heart failure returning for follow-up.   She has done well last 6 months without any recurrence of atrial fibrillation.  She has not had sustained palpitations and she wears a smart watch that has not shown atrial fibrillation.  She does have palpitations when she lies in bed at night, in a pattern suggesting isolated PACs.  She does not notice these during the day.  She continues to take flecainide as well as both metoprolol and verapamil, without any symptomatic bradycardia clinically or by her smart watch.  Both metoprolol and verapamil seem to help with her migraines (attempt to decrease the dose of metoprolol led to worsening headaches.).  She reports compliance with CPAP for OSA and denies daytime hypersomnolence.  She has not any problems with chest pain or shortness of breath with her usual light level of activity and she denies orthopnea, PND and lower extremity edema.  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  Initially presented with persistent atrial fibrillation in April 2018, had a difficult time with rate control and underwent TEE guided cardioversion.  Beta-blocker dose was decreased in May 2018 when she had junctional rhythm.  After she received her second dose of coronavirus vaccine in February 2021 she developed symptomatic persistent atrial fibrillation with RVR, again challenging to rate control and underwent cardioversion on June 24, 2019.  She then had to undergo cardioversion  twice in 2022: First in January, with early recurrence of atrial fibrillation after which flecainide was started;  second cardioversion in February after which she developed pulmonary edema and required brief hospitalization. A nuclear stress test on May 26, 2020 did not show any evidence of ischemia.   Echocardiogram on June 01, 2020 showed LVEF 55-60% with "pseudo normal" mitral inflow and no major valvular abnormalities (mild mitral regurgitation and moderate aortic regurgitation, unchanged from the previous one performed in March 2021.  The left atrium is moderately dilated.  LVEF is normal.  "Pseudonormal" mitral inflow was described, but this was performed relatively soon after her cardioversion0  She has iron deficiency anemia and is receiving iron infusions.  She was intolerant of oral iron due to severe constipation.  Additional problems include morbid obesity, obstructive sleep apnea on CPAP and treated hypertension.     Past Medical History:  Diagnosis Date   Adenomatous polyp    Arthritis    "knees, legs, fingers" (07/30/2016)   Asthma    Bursitis of left shoulder    "just finished PT" (07/30/2016)   Chest pain    a. 2003 Abnl stress test-->Cath: nonobs CAD.   Chronic bronchitis (HCC)    Chronic kidney disease    stage 3   GERD (gastroesophageal reflux disease)    barrett's esophagus- Dr. Cristina Gong   History of hiatal hernia    Hyperlipidemia    Hypertension    Hypothyroidism    Migraines    "sporatic; at least a few/year" (07/30/2016)   Mitral regurgitation  a. 07/2016 Echo: mild to mod MR;  b. 07/2016 TEE: mild MR.   Moderate aortic insufficiency    a. 07/2016 Echo: EF 55-60%, mod AI;  b. 07/2016 TEE: EF 50-55%, mod AI.   Obesity    s/p lap band surgery 6/10   OSA on CPAP    PAF (paroxysmal atrial fibrillation) ([place])    a. 07/2016 TEE/DCCV: EF 50-55%, mild MR, mod AI, mild to mod TR, neg bubble study-->successful DCCV x1 w/ 120J.   PONV (postoperative nausea and  vomiting)    Vitamin D deficiency     Past Surgical History:  Procedure Laterality Date   APPENDECTOMY     incidental   CARDIOVERSION N/A 08/01/2016   Procedure: CARDIOVERSION;  Surgeon: Jerline Pain, MD;  Location: [facility];  Service: Cardiovascular;  Laterality: N/A;   CARDIOVERSION N/A 06/24/2019   Procedure: CARDIOVERSION;  Surgeon: Sanda Klein, MD;  Location: [place];  Service: Cardiovascular;  Laterality: N/A;   CARDIOVERSION N/A 05/30/2020   Procedure: CARDIOVERSION;  Surgeon: Sanda Klein, MD;  Location: [facility];  Service: Cardiovascular;  Laterality: N/A;   CARDIOVERSION N/A 05/09/2020   Procedure: CARDIOVERSION;  Surgeon: [name], MD;  Location: [place];  Service: Cardiovascular;  Laterality: N/A;   CATARACT EXTRACTION W/ INTRAOCULAR LENS  IMPLANT, BILATERAL Bilateral ~ 2008-2017   left - right   COMBINED HYSTERECTOMY VAGINAL / OOPHORECTOMY / A&P [place]  09/2008   TEE WITHOUT CARDIOVERSION N/A 08/01/2016   Procedure: TRANSESOPHAGEAL ECHOCARDIOGRAM (TEE);  Surgeon: Jerline Pain, MD;  Location: [place];  Service: Cardiovascular;  Laterality: N/A;   [place]   "w/1 ovary"    Current Medications: Current Meds  Medication Sig   acetaminophen (TYLENOL) 650 MG CR tablet Take 1,300 mg by mouth every 8 (eight) hours as needed for pain.   apixaban (ELIQUIS) 5 MG TABS tablet Take 1 tablet twice daily   atorvastatin (LIPITOR) 10 MG tablet Take 10 mg by mouth daily.   Bacillus Coagulans-Inulin (ALIGN PREBIOTIC-PROBIOTIC PO) Take 2 capsules by mouth daily. Gummy   cholecalciferol (VITAMIN D) 1000 UNITS tablet Take 1,000 Units by mouth daily.   diclofenac Sodium (VOLTAREN) 1 % GEL Apply 1 application topically 2 (two) times daily.   fexofenadine (ALLEGRA) 180 MG tablet  Take 180 mg by mouth daily.   flecainide (TAMBOCOR) 50 MG tablet TAKE 1 TABLET(50 MG) BY MOUTH TWICE DAILY   fluticasone (FLONASE) 50 MCG/ACT nasal spray Place 2 sprays into both nostrils at bedtime.    fluticasone (FLOVENT HFA) 44 MCG/ACT inhaler Inhale 2 puffs into the lungs 2 (two) times daily. (Patient taking differently: Inhale 2 puffs into the lungs at bedtime.)   furosemide (LASIX) 20 MG tablet Take 1 tablet (20 mg total) by mouth daily.   Glucosamine-Chondroitin (OSTEO BI-FLEX REGULAR STRENGTH PO) Take 1 tablet by mouth daily.   levothyroxine (SYNTHROID, LEVOTHROID) 50 MCG tablet Take 50 mcg by mouth daily before breakfast.   magnesium oxide (MAG-OX) 400 (240 Mg) MG tablet Take 1 tablet (400 mg total) by mouth daily.   metoprolol succinate (TOPROL-XL) 100 MG 24 hr tablet Take 100 mg (one tablet) in the morning and 50 mg (half a tablet) in the evening.   metroNIDAZOLE (METROCREAM) 0.75 % cream Apply 1 application topically daily.   mometasone (ELOCON) 0.1 %  cream Apply 1 application topically 2 (two) times daily as needed (skin irritation).   Multiple Vitamins-Minerals (CENTRUM MULTIGUMMIES) CHEW Chew 1 tablet by mouth daily.   omeprazole (PRILOSEC) 20 MG capsule Take 40 mg by mouth daily before breakfast.   Psyllium (METAMUCIL) 28.3 % POWD Take 1 Scoop by mouth at bedtime.   senna (SENOKOT) 8.6 MG tablet Take 1 tablet by mouth every evening.   verapamil (CALAN-SR) 180 MG CR tablet TAKE 1 TABLET(180 MG) BY MOUTH AT BEDTIME     Allergies:   Clindamycin hcl, Augmentin [amoxicillin-pot clavulanate], Codeine, Demerol [meperidine], and Sulfa antibiotics   Social History   Socioeconomic History   Marital status: Divorced    Spouse name: Not on file   Number of children: Not on file   Years of education: Not on file   Highest education level: Not on file  Occupational History   Not on file  Tobacco Use   Smoking status: Former   Smokeless tobacco: Never   Tobacco comments:     07/30/2016 "social smoker only; in college"  Vaping Use   Vaping Use: Never used  Substance and Sexual Activity   Alcohol use: Yes    Alcohol/week: 2.0 standard drinks of alcohol    Types: 1 Glasses of wine, 1 Standard drinks or equivalent per week    Comment: 07/30/2016 "couple drinks/month"   Drug use: No   Sexual activity: Not Currently  Other Topics Concern   Not on file  Social History Narrative   Not on file   Social Determinants of Health   Financial Resource Strain: Not on file  Food Insecurity: Not on file  Transportation Needs: Not on file  Physical Activity: Not on file  Stress: Not on file  Social Connections: Not on file     Family History: The patient's family history includes Emphysema in her father; Heart failure in her mother; Hypertension in her father and mother. ROS:   Please see the history of present illness.   All other systems are reviewed and are negative.   EKGs/Labs/Other Studies Reviewed:     EKG: Ordered today and personally reviewed shows normal sinus rhythm, narrow QRS complex less than 90 ms, normal QTc no ischemic repolarization abnormalities.  Recent Labs: 09/04/2021 Hemoglobin A1c 5.7% Hemoglobin 13.0, creatinine 1.1, potassium 4.4, ALT 13, TSH 2.61 BMET    Component Value Date/Time   NA 139 09/09/2020 1128   NA 137 06/07/2020 1045   K 4.0 09/09/2020 1128   CL 107 09/09/2020 1128   CO2 20 (L) 09/09/2020 1105   GLUCOSE 132 (H) 09/09/2020 1128   BUN 19 09/09/2020 1128   BUN 24 06/07/2020 1045   CREATININE 0.90 09/09/2020 1128   CREATININE 1.16 (H) 09/06/2020 0754   CREATININE 1.49 (H) 08/13/2016 1556   CALCIUM 8.5 (L) 09/09/2020 1105   GFRNONAA 55 (L) 09/09/2020 1105   GFRNONAA 49 (L) 09/06/2020 0754   GFRNONAA 35 (L) 08/13/2016 1556   GFRAA 42 (L) 06/07/2020 1045   GFRAA 41 (L) 08/13/2016 1556     Recent Lipid Panel 09/04/2021 Cholesterol 179, HDL 40, LDL 104, triglycerides 200     Component Value Date/Time   CHOL 146  07/31/2016 0435   TRIG 174 (H) 07/31/2016 0435   HDL 32 (L) 07/31/2016 0435   CHOLHDL 4.6 07/31/2016 0435   VLDL 35 07/31/2016 0435   LDLCALC 79 07/31/2016 0435    Physical Exam:    VS:  BP (!) 143/70 (BP Location: Left Arm, Patient Position:  Sitting, Cuff Size: Large)   Pulse 62   Ht 5' 5.5" (1.664 m)   Wt 273 lb 6.4 oz (124 kg)   SpO2 97%   BMI 44.80 kg/m     Wt Readings from Last 3 Encounters:  10/25/21 273 lb 6.4 oz (124 kg)  04/24/21 274 lb (124.3 kg)  03/22/21 271 lb 3.2 oz (123 kg)      General: Alert, oriented x3, no distress, morbidly obese Head: no evidence of trauma, PERRL, EOMI, no exophtalmos or lid lag, no myxedema, no xanthelasma; normal ears, nose and oropharynx Neck: normal jugular venous pulsations and no hepatojugular reflux; brisk carotid pulses without delay and no carotid bruits Chest: clear to auscultation, no signs of consolidation by percussion or palpation, normal fremitus, symmetrical and full respiratory excursions Cardiovascular: normal position and quality of the apical impulse, regular rhythm, normal first and second heart sounds, faint systolic aortic ejection murmur is early peaking, no diastolic murmurs, rubs or gallops Abdomen: no tenderness or distention, no masses by palpation, no abnormal pulsatility or arterial bruits, normal bowel sounds, no hepatosplenomegaly Extremities: no clubbing, cyanosis or edema; 2+ radial, ulnar and brachial pulses bilaterally; 2+ right femoral, posterior tibial and dorsalis pedis pulses; 2+ left femoral, posterior tibial and dorsalis pedis pulses; no subclavian or femoral bruits Neurological: grossly nonfocal Psych: Normal mood and affect   ASSESSMENT:    1. Paroxysmal atrial fibrillation (HCC)   2. Acquired thrombophilia ([place])   3. Essential hypertension   4. Diastolic dysfunction   5. OSA (obstructive sleep apnea)   6. Morbid obesity ([place])      PLAN:    In order of problems listed above:  AFib:  Overall excellent response to flecainide plus beta-blocker plus verapamil .  No flecainide toxicity to date (the episode of heart failure occurred immediately after cardioversion and was likely related to the prolonged period of time spent in atrial fibrillation).  CHADSVasc 4 (age3, gender, HTN).  Anticoagulation: No falls and no bleeding problems. HTN: Controlled on verapamil and metoprolol. Diastolic dysfunction: Currently on a tiny dose of diuretic.  Clinically euvolemic, NYHA functional class I-2.  Diastolic dysfunction was described on an echo performed almost immediately after cardioversion and cannot exclude a component of atrial mechanical stunning.  She did have heart failure immediately following the cardioversion in February and probably has some degree of diastolic dysfunction.  Currently appears to be hemodynamically compensated. OSA: Compliant with CPAP, denies hypersomnolence Obesity: We have discussed the regulation issue between weight and atrial fibrillation burden. MR/AR: Both mild MR and moderate AR have been stable on echocardiograms performed over the last 4 years.  Migraines: These do better on higher doses of metoprolol and verapamil.  Medication Adjustments/Labs and Tests Ordered: Current medicines are reviewed at length with the patient today.  Concerns regarding medicines are outlined above.  No orders of the defined types were placed in this encounter.   No orders of the defined types were placed in this encounter.   Patient Instructions  Medication Instructions:  No changes *If you need a refill on your cardiac medications before your next appointment, please call your pharmacy*   Lab Work: None ordered If you have labs (blood work) drawn today and your tests are completely normal, you will receive your results only by: [place] (if you have MyChart) OR A paper copy in the mail If you have any lab test that is abnormal or we need to change your  treatment, we will call you to review  the results.   Testing/Procedures: None ordered   Follow-Up: At [facility], you and your health needs are our priority.  As part of our continuing mission to provide you with exceptional heart care, we have created designated Provider Care Teams.  These Care Teams include your primary Cardiologist (physician) and Advanced Practice Providers (APPs -  Physician Assistants and Nurse Practitioners) who all work together to provide you with the care you need, when you need it.  We recommend signing up for the patient portal called "MyChart".  Sign up information is provided on this After Visit Summary.  MyChart is used to connect with patients for Virtual Visits (Telemedicine).  Patients are able to view lab/test results, encounter notes, upcoming appointments, etc.  Non-urgent messages can be sent to your provider as well.   To learn more about what you can do with MyChart, go to [email or web address].    Your next appointment:   6 month(s)  The format for your next appointment:   In Person  Provider:   Sanda Klein, MD {   Important Information About Sugar         Signed, Sanda Klein, MD  10/25/2021 1:27 PM    [place]

## 2021-10-25 NOTE — Patient Instructions (Signed)
Medication Instructions:  No changes *If you need a refill on your cardiac medications before your next appointment, please call your pharmacy*   Lab Work: None ordered If you have labs (blood work) drawn today and your tests are completely normal, you will receive your results only by: MyChart Message (if you have MyChart) OR A paper copy in the mail If you have any lab test that is abnormal or we need to change your treatment, we will call you to review the results.   Testing/Procedures: None ordered   Follow-Up: At [facility], you and your health needs are our priority.  As part of our continuing mission to provide you with exceptional heart care, we have created designated Provider Care Teams.  These Care Teams include your primary Cardiologist (physician) and Advanced Practice Providers (APPs -  Physician Assistants and Nurse Practitioners) who all work together to provide you with the care you need, when you need it.  We recommend signing up for the patient portal called "MyChart".  Sign up information is provided on this After Visit Summary.  MyChart is used to connect with patients for Virtual Visits (Telemedicine).  Patients are able to view lab/test results, encounter notes, upcoming appointments, etc.  Non-urgent messages can be sent to your provider as well.   To learn more about what you can do with MyChart, go to [email or web address].    Your next appointment:   6 month(s)  The format for your next appointment:   In Person  Provider:   Mihai Croitoru, MD {    Important Information About Sugar       

## 2021-10-26 NOTE — Addendum Note (Signed)
Addended by: Orma Render on: 10/26/2021 10:37 AM   Modules accepted: Orders

## 2021-10-29 NOTE — Addendum Note (Signed)
Addended by: Orma Render on: 10/29/2021 10:05 AM   Modules accepted: Orders

## 2021-11-08 DIAGNOSIS — K5904 Chronic idiopathic constipation: Secondary | ICD-10-CM | POA: Diagnosis not present

## 2021-11-08 DIAGNOSIS — I480 Paroxysmal atrial fibrillation: Secondary | ICD-10-CM | POA: Diagnosis not present

## 2021-11-08 DIAGNOSIS — K469 Unspecified abdominal hernia without obstruction or gangrene: Secondary | ICD-10-CM | POA: Diagnosis not present

## 2021-11-08 DIAGNOSIS — N816 Rectocele: Secondary | ICD-10-CM | POA: Diagnosis not present

## 2021-11-08 DIAGNOSIS — Z789 Other specified health status: Secondary | ICD-10-CM | POA: Diagnosis not present

## 2021-11-29 DIAGNOSIS — G4733 Obstructive sleep apnea (adult) (pediatric): Secondary | ICD-10-CM | POA: Diagnosis not present

## 2021-12-07 DIAGNOSIS — M1990 Unspecified osteoarthritis, unspecified site: Secondary | ICD-10-CM | POA: Diagnosis not present

## 2021-12-07 DIAGNOSIS — E7800 Pure hypercholesterolemia, unspecified: Secondary | ICD-10-CM | POA: Diagnosis not present

## 2021-12-07 DIAGNOSIS — K219 Gastro-esophageal reflux disease without esophagitis: Secondary | ICD-10-CM | POA: Diagnosis not present

## 2021-12-07 DIAGNOSIS — J4521 Mild intermittent asthma with (acute) exacerbation: Secondary | ICD-10-CM | POA: Diagnosis not present

## 2021-12-07 DIAGNOSIS — I480 Paroxysmal atrial fibrillation: Secondary | ICD-10-CM | POA: Diagnosis not present

## 2021-12-07 DIAGNOSIS — I10 Essential (primary) hypertension: Secondary | ICD-10-CM | POA: Diagnosis not present

## 2021-12-10 ENCOUNTER — Other Ambulatory Visit: Payer: Self-pay | Admitting: Family Medicine

## 2021-12-10 DIAGNOSIS — Z1231 Encounter for screening mammogram for malignant neoplasm of breast: Secondary | ICD-10-CM

## 2021-12-31 ENCOUNTER — Other Ambulatory Visit: Payer: Self-pay | Admitting: Cardiovascular Disease

## 2021-12-31 DIAGNOSIS — I4819 Other persistent atrial fibrillation: Secondary | ICD-10-CM

## 2021-12-31 NOTE — Telephone Encounter (Signed)
Prescription refill request for Eliquis received. Indication: Afib  Last office visit: 10/25/21 (Croitoru)  Scr: 1.10 (09/04/21)  Age: 76 Weight: 124kg  Appropriate dose and refill sent to requested pharmacy.

## 2022-01-03 DIAGNOSIS — Z01818 Encounter for other preprocedural examination: Secondary | ICD-10-CM | POA: Diagnosis not present

## 2022-01-08 DIAGNOSIS — I10 Essential (primary) hypertension: Secondary | ICD-10-CM | POA: Diagnosis not present

## 2022-01-08 DIAGNOSIS — N816 Rectocele: Secondary | ICD-10-CM | POA: Diagnosis not present

## 2022-01-08 DIAGNOSIS — Z6841 Body Mass Index (BMI) 40.0 and over, adult: Secondary | ICD-10-CM | POA: Diagnosis not present

## 2022-01-08 DIAGNOSIS — K5902 Outlet dysfunction constipation: Secondary | ICD-10-CM | POA: Diagnosis not present

## 2022-01-08 DIAGNOSIS — E039 Hypothyroidism, unspecified: Secondary | ICD-10-CM | POA: Diagnosis not present

## 2022-01-08 DIAGNOSIS — Z87891 Personal history of nicotine dependence: Secondary | ICD-10-CM | POA: Diagnosis not present

## 2022-01-08 DIAGNOSIS — Z7901 Long term (current) use of anticoagulants: Secondary | ICD-10-CM | POA: Diagnosis not present

## 2022-01-08 DIAGNOSIS — Z79899 Other long term (current) drug therapy: Secondary | ICD-10-CM | POA: Diagnosis not present

## 2022-01-08 DIAGNOSIS — N812 Incomplete uterovaginal prolapse: Secondary | ICD-10-CM | POA: Diagnosis not present

## 2022-01-08 DIAGNOSIS — N813 Complete uterovaginal prolapse: Secondary | ICD-10-CM | POA: Diagnosis not present

## 2022-01-08 DIAGNOSIS — Z79890 Hormone replacement therapy (postmenopausal): Secondary | ICD-10-CM | POA: Diagnosis not present

## 2022-01-08 DIAGNOSIS — G4733 Obstructive sleep apnea (adult) (pediatric): Secondary | ICD-10-CM | POA: Diagnosis not present

## 2022-01-08 DIAGNOSIS — K469 Unspecified abdominal hernia without obstruction or gangrene: Secondary | ICD-10-CM | POA: Diagnosis not present

## 2022-01-08 DIAGNOSIS — E785 Hyperlipidemia, unspecified: Secondary | ICD-10-CM | POA: Diagnosis not present

## 2022-01-08 DIAGNOSIS — Z9989 Dependence on other enabling machines and devices: Secondary | ICD-10-CM | POA: Diagnosis not present

## 2022-01-08 DIAGNOSIS — I480 Paroxysmal atrial fibrillation: Secondary | ICD-10-CM | POA: Diagnosis not present

## 2022-01-16 ENCOUNTER — Other Ambulatory Visit: Payer: Self-pay | Admitting: Cardiovascular Disease

## 2022-02-01 DIAGNOSIS — I10 Essential (primary) hypertension: Secondary | ICD-10-CM | POA: Diagnosis not present

## 2022-02-01 DIAGNOSIS — E7800 Pure hypercholesterolemia, unspecified: Secondary | ICD-10-CM | POA: Diagnosis not present

## 2022-02-01 DIAGNOSIS — N183 Chronic kidney disease, stage 3 unspecified: Secondary | ICD-10-CM | POA: Diagnosis not present

## 2022-02-01 DIAGNOSIS — K219 Gastro-esophageal reflux disease without esophagitis: Secondary | ICD-10-CM | POA: Diagnosis not present

## 2022-02-01 DIAGNOSIS — J4521 Mild intermittent asthma with (acute) exacerbation: Secondary | ICD-10-CM | POA: Diagnosis not present

## 2022-02-01 DIAGNOSIS — M1990 Unspecified osteoarthritis, unspecified site: Secondary | ICD-10-CM | POA: Diagnosis not present

## 2022-02-15 DIAGNOSIS — M25511 Pain in right shoulder: Secondary | ICD-10-CM | POA: Diagnosis not present

## 2022-02-15 DIAGNOSIS — M25572 Pain in left ankle and joints of left foot: Secondary | ICD-10-CM | POA: Diagnosis not present

## 2022-02-22 ENCOUNTER — Telehealth: Payer: Self-pay | Admitting: *Deleted

## 2022-02-22 ENCOUNTER — Encounter: Payer: Self-pay | Admitting: *Deleted

## 2022-02-22 NOTE — Patient Instructions (Signed)
Visit Information  Thank you for taking time to visit with me today. Please don't hesitate to contact me if I can be of assistance to you.   Following are the goals we discussed today:   Goals Addressed               This Visit's Progress     COMPLETED: No needs (pt-stated)        Care Coordination Interventions: Provided education to patient and/or caregiver about advanced directives Reviewed medications with patient and discussed adherence to all prescribed medications Reviewed scheduled/upcoming provider appointments including pending appointments with sufficient transportation. Screening for signs and symptoms of depression related to chronic disease state  Assessed social determinant of health barriers          Please call the care guide team at [phone] if you need to cancel or reschedule your appointment.   If you are experiencing a Mental Health or [place] or need someone to talk to, please call the Suicide and Crisis Lifeline: 988  Patient verbalizes understanding of instructions and care plan provided today and agrees to view in [place]. Active MyChart status and patient understanding of how to access instructions and care plan via MyChart confirmed with patient.     No further follow up required: NO FURTHER NEEDS  Raina Mina, RN Care Management Coordinator [place] Office [phone]

## 2022-02-22 NOTE — Patient Outreach (Signed)
  Care Coordination   Initial Visit Note   02/22/2022 Name: Leslie [name] MRN: [id number] DOB: 1945/12/28  Leslie [name] is a 76 y.o. year old female who sees Lindell Noe, Anastasia Pall, MD for primary care. I spoke with  [name] by phone today.  What matters to the patients health and wellness today?  No needs    Goals Addressed               This Visit's Progress     COMPLETED: No needs (pt-stated)        Care Coordination Interventions: Provided education to patient and/or caregiver about advanced directives Reviewed medications with patient and discussed adherence to all prescribed medications Reviewed scheduled/upcoming provider appointments including pending appointments with sufficient transportation. Screening for signs and symptoms of depression related to chronic disease state  Assessed social determinant of health barriers          SDOH assessments and interventions completed:  Yes  SDOH Interventions Today    Flowsheet Row Most Recent Value  SDOH Interventions   Food Insecurity Interventions Intervention Not Indicated  Housing Interventions Intervention Not Indicated  Transportation Interventions Intervention Not Indicated  Utilities Interventions Intervention Not Indicated        Care Coordination Interventions Activated:  Yes  Care Coordination Interventions:  Yes, provided   Follow up plan: No further intervention required.   Encounter Outcome:  Pt. Visit Completed   Raina Mina, RN Care Management Coordinator [place] Office [phone]

## 2022-02-25 DIAGNOSIS — N952 Postmenopausal atrophic vaginitis: Secondary | ICD-10-CM | POA: Diagnosis not present

## 2022-02-25 DIAGNOSIS — K469 Unspecified abdominal hernia without obstruction or gangrene: Secondary | ICD-10-CM | POA: Diagnosis not present

## 2022-02-25 DIAGNOSIS — N816 Rectocele: Secondary | ICD-10-CM | POA: Diagnosis not present

## 2022-02-25 DIAGNOSIS — Z09 Encounter for follow-up examination after completed treatment for conditions other than malignant neoplasm: Secondary | ICD-10-CM | POA: Diagnosis not present

## 2022-02-28 ENCOUNTER — Ambulatory Visit
Admission: RE | Admit: 2022-02-28 | Discharge: 2022-02-28 | Disposition: A | Discharge status: Home / Self Care | Payer: PPO | Source: Ambulatory Visit | Attending: Family Medicine | Admitting: Family Medicine

## 2022-02-28 DIAGNOSIS — Z1231 Encounter for screening mammogram for malignant neoplasm of breast: Secondary | ICD-10-CM | POA: Diagnosis not present

## 2022-03-04 ENCOUNTER — Other Ambulatory Visit: Payer: Self-pay | Admitting: Family Medicine

## 2022-03-04 DIAGNOSIS — G4733 Obstructive sleep apnea (adult) (pediatric): Secondary | ICD-10-CM | POA: Diagnosis not present

## 2022-03-04 DIAGNOSIS — R928 Other abnormal and inconclusive findings on diagnostic imaging of breast: Secondary | ICD-10-CM

## 2022-03-08 IMAGING — MG DIGITAL SCREENING BILAT W/ TOMO W/ CAD
8 series · 8 of 24 positions shown · non-contrast
Comparison: Previous exam(s).

CLINICAL DATA: Screening.

EXAM:
DIGITAL SCREENING BILATERAL MAMMOGRAM WITH TOMO AND CAD

[R CC synth-2D]
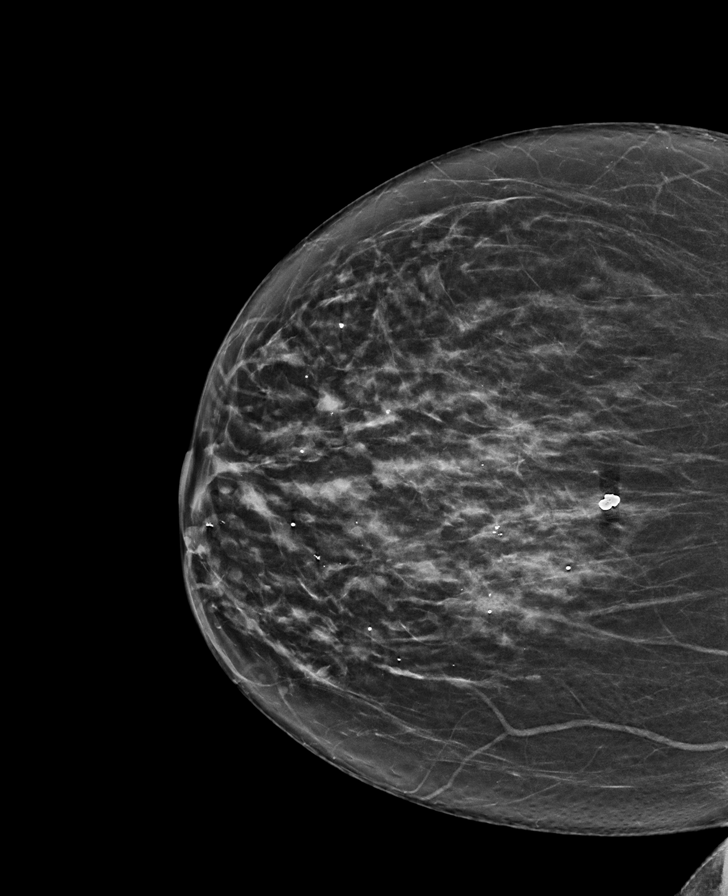

[L MLO synth-2D]
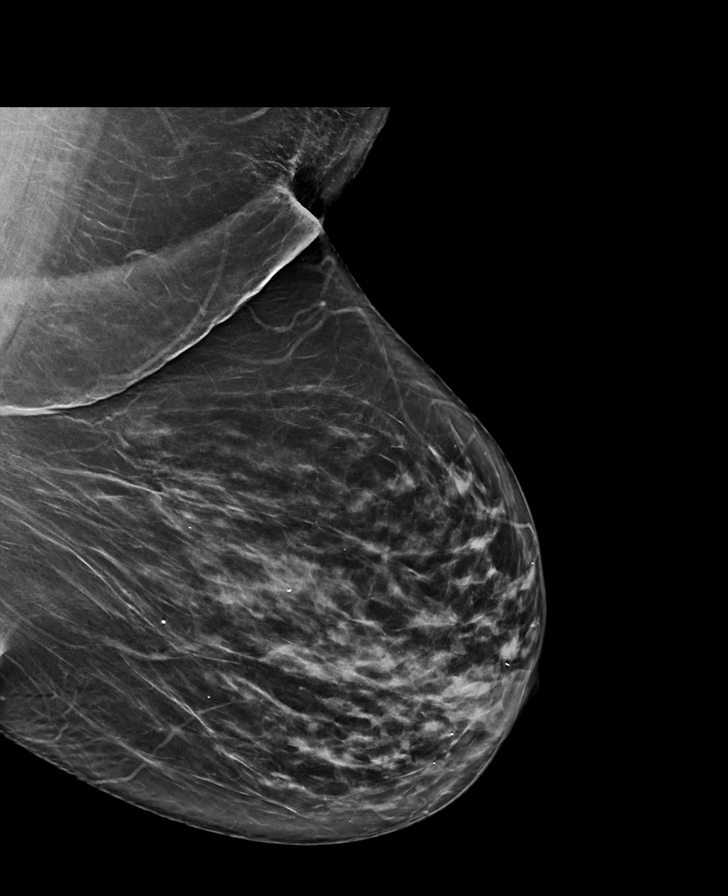

[R MLO synth-2D]
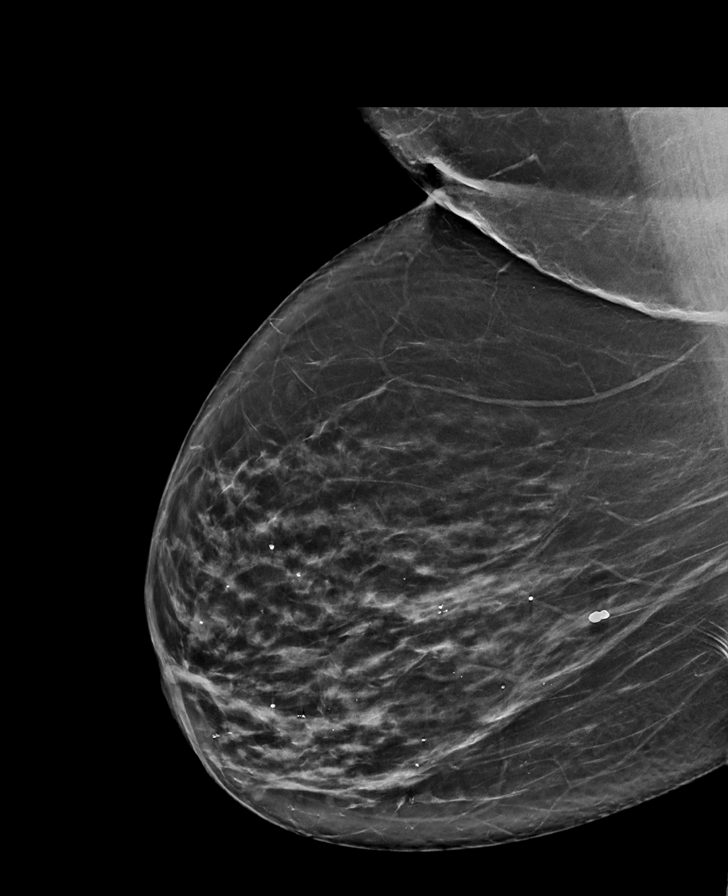

[L CC synth-2D]
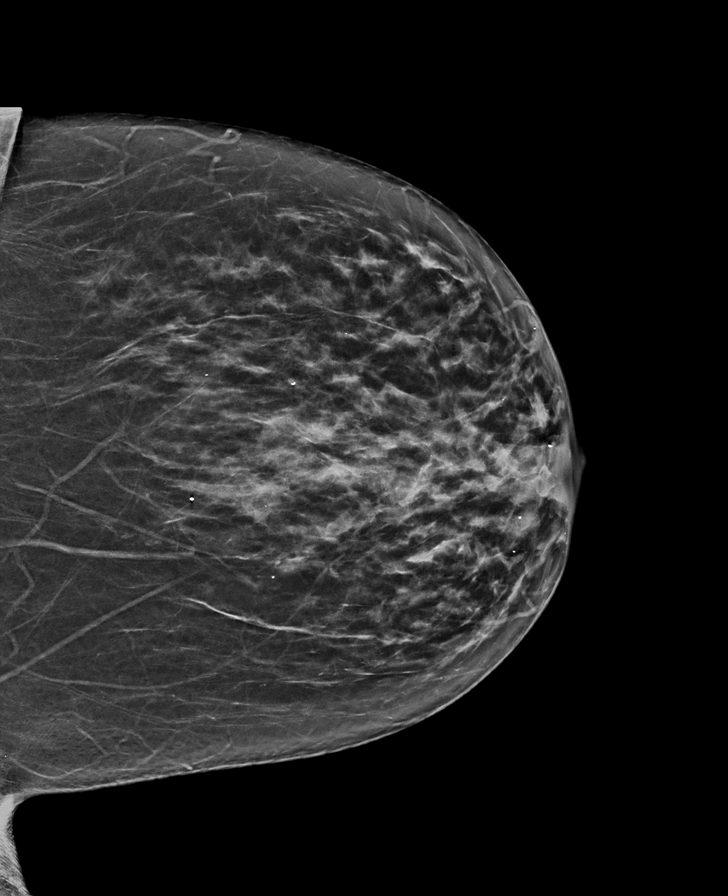

[L MLO tomo · tomo slice 37/73.0]
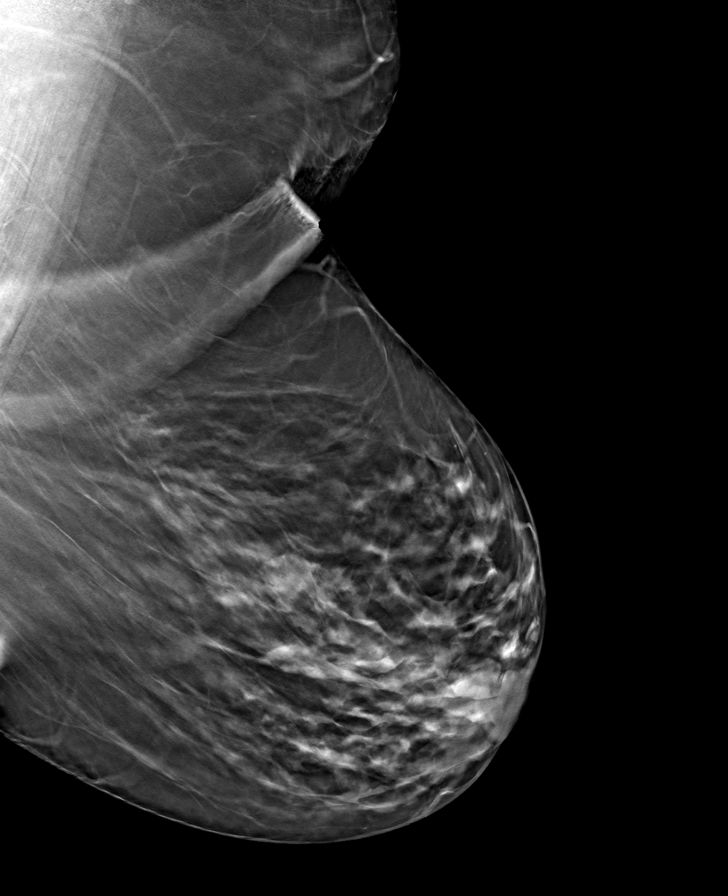

[L CC tomo · tomo slice 31/61.0]
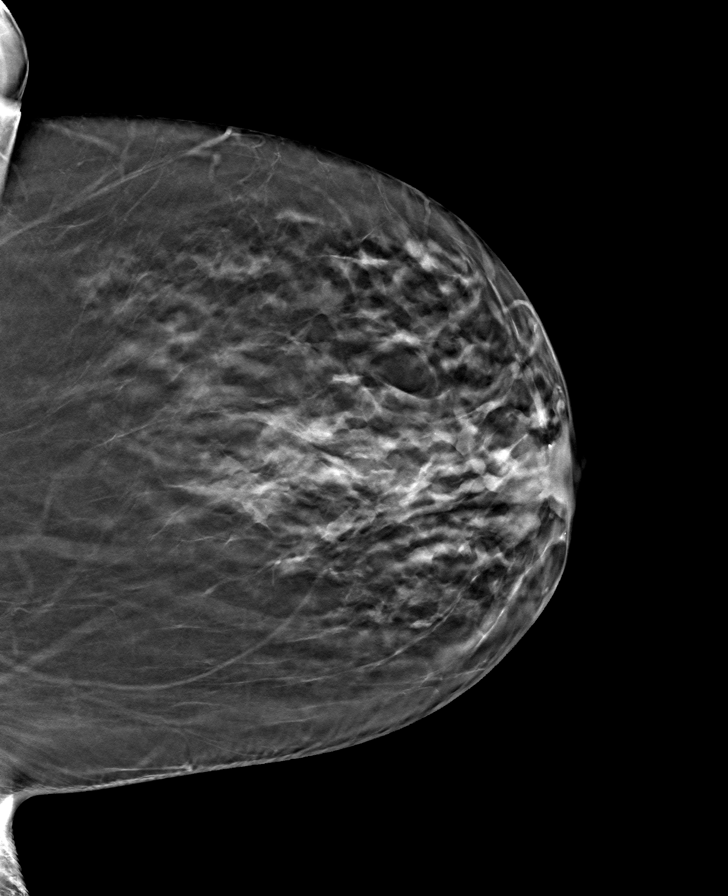

[R MLO tomo · tomo slice 39/78.0]
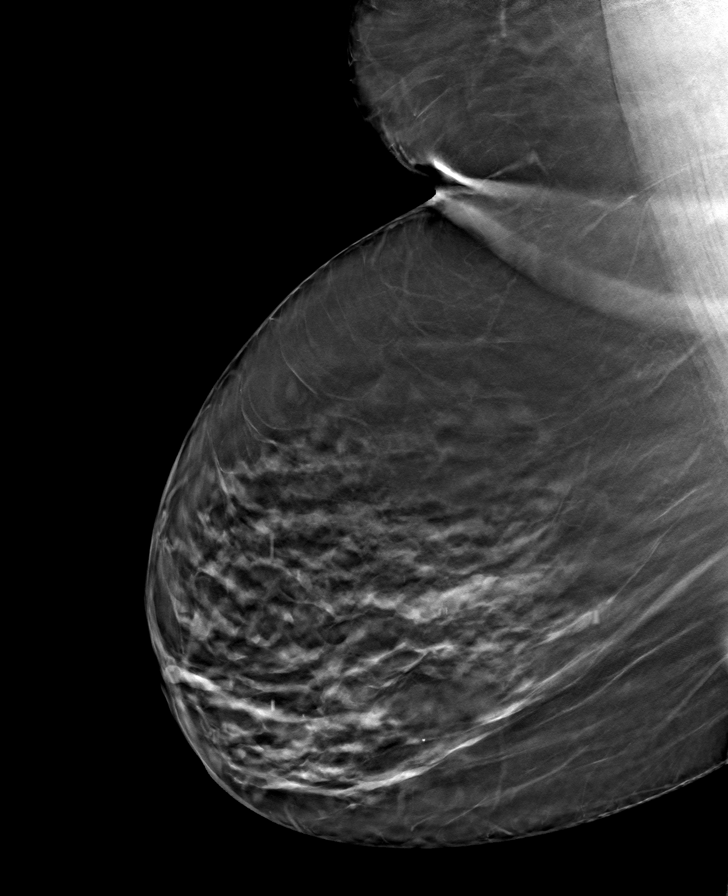

[R CC tomo · tomo slice 31/62.0]
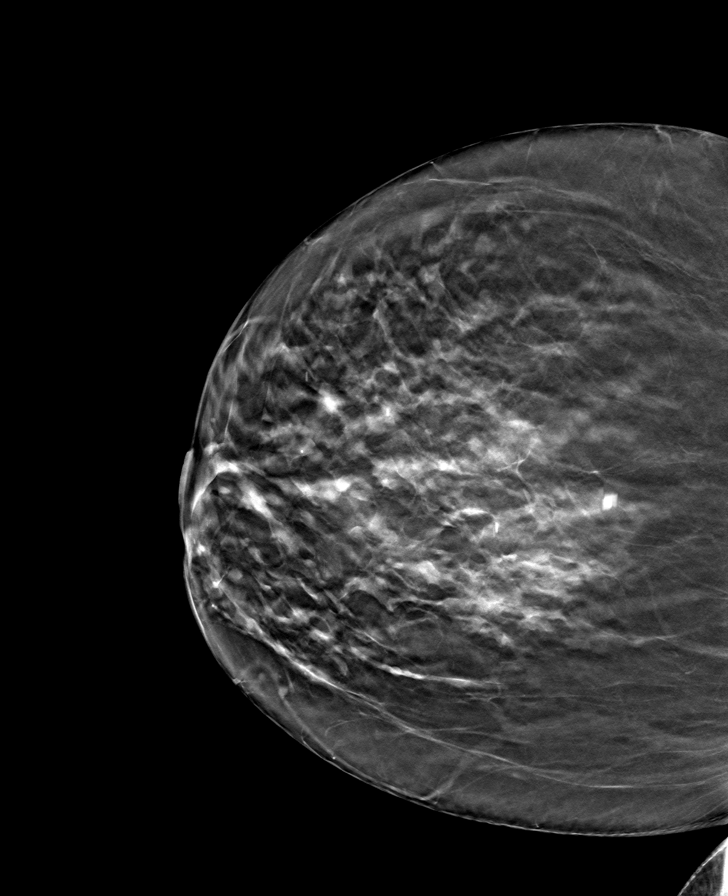

[8 of 24 positions shown; findings below may reference images not displayed]

ACR Breast Density Category c: The breast tissue is heterogeneously
dense, which may obscure small masses.
FINDINGS: There are no findings suspicious for malignancy. Images were
processed with CAD.
IMPRESSION: No mammographic evidence of malignancy. A result letter of this
screening mammogram will be mailed directly to the patient.

RECOMMENDATION:
Screening mammogram in one year. (Code:[id number])

BI-RADS CATEGORY  1: Negative.

## 2022-03-13 DIAGNOSIS — E039 Hypothyroidism, unspecified: Secondary | ICD-10-CM | POA: Diagnosis not present

## 2022-03-13 DIAGNOSIS — I10 Essential (primary) hypertension: Secondary | ICD-10-CM | POA: Diagnosis not present

## 2022-03-13 DIAGNOSIS — R7303 Prediabetes: Secondary | ICD-10-CM | POA: Diagnosis not present

## 2022-03-13 DIAGNOSIS — E2839 Other primary ovarian failure: Secondary | ICD-10-CM | POA: Diagnosis not present

## 2022-03-13 DIAGNOSIS — E7800 Pure hypercholesterolemia, unspecified: Secondary | ICD-10-CM | POA: Diagnosis not present

## 2022-03-14 ENCOUNTER — Other Ambulatory Visit: Payer: Self-pay | Admitting: Family Medicine

## 2022-03-14 DIAGNOSIS — E2839 Other primary ovarian failure: Secondary | ICD-10-CM

## 2022-03-20 ENCOUNTER — Ambulatory Visit
Admission: RE | Admit: 2022-03-20 | Discharge: 2022-03-20 | Disposition: A | Discharge status: Home / Self Care | Payer: PPO | Source: Ambulatory Visit | Attending: Family Medicine | Admitting: Family Medicine

## 2022-03-20 DIAGNOSIS — R92322 Mammographic fibroglandular density, left breast: Secondary | ICD-10-CM | POA: Diagnosis not present

## 2022-03-20 DIAGNOSIS — N6012 Diffuse cystic mastopathy of left breast: Secondary | ICD-10-CM | POA: Diagnosis not present

## 2022-03-20 DIAGNOSIS — R928 Other abnormal and inconclusive findings on diagnostic imaging of breast: Secondary | ICD-10-CM

## 2022-03-25 DIAGNOSIS — G4733 Obstructive sleep apnea (adult) (pediatric): Secondary | ICD-10-CM | POA: Diagnosis not present

## 2022-03-25 DIAGNOSIS — I10 Essential (primary) hypertension: Secondary | ICD-10-CM | POA: Diagnosis not present

## 2022-04-01 ENCOUNTER — Telehealth: Payer: Self-pay

## 2022-04-01 NOTE — Telephone Encounter (Signed)
   Pre-operative Risk Assessment    Patient Name: Leslie [name]  DOB: 1945-11-18 MRN: [id number]     Request for Surgical Clearance    Procedure:  Colonoscopy/endoscopy    Date of Surgery:  Clearance 06/08/22  Surgeon:  Dr. Therisa Doyne Surgeon's Group or Practice Name:  [facility]  Phone number:  [phone] Fax number:  [phone]   Type of Clearance Requested:   - Pharmacy:  Hold Apixaban (Eliquis)     Type of Anesthesia:  Propofol    Additional requests/questions:    Oneal Grout   04/01/2022, 4:18 PM

## 2022-04-01 NOTE — Telephone Encounter (Signed)
Pharmacy, can you please comment on how long Eliquis can be held for upcoming procedure?  Thank you! 

## 2022-04-02 NOTE — Telephone Encounter (Signed)
Patient with diagnosis of atrial fibrillation on Eliquis for anticoagulation.    Procedure: colonoscopy/endoscopy Date of procedure: 06/08/22   CHA2DS2-VASc Score = 5   This indicates a 7.2% annual risk of stroke. The patient's score is based upon: CHF History: 1 HTN History: 1 Diabetes History: 0 Stroke History: 0 Vascular Disease History: 0 Age Score: 2 Gender Score: 1    CrCl 62 (with adjusted body weight) Platelet count 288  Per office protocol, patient can hold Eliquis for 2 days prior to procedure.   Patient will not need bridging with Lovenox (enoxaparin) around procedure.  **This guidance is not considered finalized until pre-operative APP has relayed final recommendations.**

## 2022-04-02 NOTE — Telephone Encounter (Signed)
   Patient Name: Leslie [name]  DOB: 1945-06-24 MRN: [id number]  Primary Cardiologist: Sanda Klein, MD  Clinical pharmacists have reviewed the patient's past medical history, labs, and current medications as part of preoperative protocol coverage. The following recommendations have been made:  Patient with diagnosis of atrial fibrillation on Eliquis for anticoagulation.     Procedure: colonoscopy/endoscopy Date of procedure: 06/08/22     CHA2DS2-VASc Score = 5   This indicates a 7.2% annual risk of stroke. The patient's score is based upon: CHF History: 1 HTN History: 1 Diabetes History: 0 Stroke History: 0 Vascular Disease History: 0 Age Score: 2 Gender Score: 1     CrCl 62 (with adjusted body weight) Platelet count 288   Per office protocol, patient can hold Eliquis for 2 days prior to procedure.   Patient will not need bridging with Lovenox (enoxaparin) around procedure. Please resume Eliquis as soon as possible postprocedure, at the discretion of the surgeon.   I will route this recommendation to the requesting party via Epic fax function and remove from pre-op pool.  Please call with questions.  Lenna Sciara, NP 04/02/2022, 1:35 PM

## 2022-04-08 ENCOUNTER — Telehealth: Payer: Self-pay | Admitting: Student

## 2022-04-08 NOTE — Telephone Encounter (Unsigned)
Telephone encounter:  Pt reports shortness of breath that started this evening. Says that SOB is present at rest and with exertion. Reports she has had SOB like this a year ago when she was volume overloaded from HFpEF exacerbation. She has an apple watch - which shows she is in sinus rhythm. Denies chest pain, presyncope, syncope, orthopnea, PND. Her apple watch says that her O2 sat is 82%, but it is unclear whether this is accurate. Given the patient's continued shortness of breath and low O2 sat, advised her to go to the ER for further evaluation.  Loel Dubonnet MD MPH [facility]

## 2022-04-09 ENCOUNTER — Other Ambulatory Visit: Payer: Self-pay

## 2022-04-09 ENCOUNTER — Inpatient Hospital Stay (HOSPITAL_COMMUNITY)
Admission: EM | Admit: 2022-04-09 | Discharge: 2022-04-12 | DRG: 286 | Disposition: A | Discharge status: Home / Self Care | Payer: PPO | Attending: Family Medicine | Admitting: Family Medicine

## 2022-04-09 ENCOUNTER — Emergency Department (HOSPITAL_COMMUNITY): Payer: PPO

## 2022-04-09 ENCOUNTER — Encounter (HOSPITAL_COMMUNITY): Payer: Self-pay | Admitting: Internal Medicine

## 2022-04-09 ENCOUNTER — Inpatient Hospital Stay (HOSPITAL_COMMUNITY): Payer: PPO

## 2022-04-09 DIAGNOSIS — D509 Iron deficiency anemia, unspecified: Secondary | ICD-10-CM | POA: Diagnosis not present

## 2022-04-09 DIAGNOSIS — J4489 Other specified chronic obstructive pulmonary disease: Secondary | ICD-10-CM | POA: Diagnosis present

## 2022-04-09 DIAGNOSIS — I130 Hypertensive heart and chronic kidney disease with heart failure and stage 1 through stage 4 chronic kidney disease, or unspecified chronic kidney disease: Secondary | ICD-10-CM | POA: Diagnosis not present

## 2022-04-09 DIAGNOSIS — E7800 Pure hypercholesterolemia, unspecified: Secondary | ICD-10-CM | POA: Diagnosis not present

## 2022-04-09 DIAGNOSIS — I5A Non-ischemic myocardial injury (non-traumatic): Secondary | ICD-10-CM | POA: Diagnosis present

## 2022-04-09 DIAGNOSIS — D6859 Other primary thrombophilia: Secondary | ICD-10-CM | POA: Diagnosis not present

## 2022-04-09 DIAGNOSIS — Z90710 Acquired absence of both cervix and uterus: Secondary | ICD-10-CM

## 2022-04-09 DIAGNOSIS — N183 Chronic kidney disease, stage 3 unspecified: Secondary | ICD-10-CM | POA: Diagnosis present

## 2022-04-09 DIAGNOSIS — I5021 Acute systolic (congestive) heart failure: Secondary | ICD-10-CM

## 2022-04-09 DIAGNOSIS — I5033 Acute on chronic diastolic (congestive) heart failure: Secondary | ICD-10-CM | POA: Diagnosis not present

## 2022-04-09 DIAGNOSIS — Z79899 Other long term (current) drug therapy: Secondary | ICD-10-CM | POA: Diagnosis not present

## 2022-04-09 DIAGNOSIS — I2510 Atherosclerotic heart disease of native coronary artery without angina pectoris: Secondary | ICD-10-CM | POA: Diagnosis present

## 2022-04-09 DIAGNOSIS — Z825 Family history of asthma and other chronic lower respiratory diseases: Secondary | ICD-10-CM

## 2022-04-09 DIAGNOSIS — Z8249 Family history of ischemic heart disease and other diseases of the circulatory system: Secondary | ICD-10-CM | POA: Diagnosis not present

## 2022-04-09 DIAGNOSIS — R7989 Other specified abnormal findings of blood chemistry: Secondary | ICD-10-CM | POA: Insufficient documentation

## 2022-04-09 DIAGNOSIS — I10 Essential (primary) hypertension: Secondary | ICD-10-CM | POA: Diagnosis present

## 2022-04-09 DIAGNOSIS — Z7951 Long term (current) use of inhaled steroids: Secondary | ICD-10-CM

## 2022-04-09 DIAGNOSIS — J45909 Unspecified asthma, uncomplicated: Secondary | ICD-10-CM | POA: Diagnosis not present

## 2022-04-09 DIAGNOSIS — Z794 Long term (current) use of insulin: Secondary | ICD-10-CM | POA: Diagnosis not present

## 2022-04-09 DIAGNOSIS — I959 Hypotension, unspecified: Secondary | ICD-10-CM | POA: Diagnosis not present

## 2022-04-09 DIAGNOSIS — I5043 Acute on chronic combined systolic (congestive) and diastolic (congestive) heart failure: Secondary | ICD-10-CM | POA: Diagnosis not present

## 2022-04-09 DIAGNOSIS — I160 Hypertensive urgency: Secondary | ICD-10-CM | POA: Diagnosis not present

## 2022-04-09 DIAGNOSIS — I2489 Other forms of acute ischemic heart disease: Secondary | ICD-10-CM | POA: Diagnosis not present

## 2022-04-09 DIAGNOSIS — Z87891 Personal history of nicotine dependence: Secondary | ICD-10-CM | POA: Diagnosis not present

## 2022-04-09 DIAGNOSIS — I5181 Takotsubo syndrome: Secondary | ICD-10-CM | POA: Diagnosis not present

## 2022-04-09 DIAGNOSIS — I214 Non-ST elevation (NSTEMI) myocardial infarction: Secondary | ICD-10-CM | POA: Diagnosis not present

## 2022-04-09 DIAGNOSIS — I480 Paroxysmal atrial fibrillation: Secondary | ICD-10-CM | POA: Diagnosis not present

## 2022-04-09 DIAGNOSIS — K219 Gastro-esophageal reflux disease without esophagitis: Secondary | ICD-10-CM | POA: Diagnosis not present

## 2022-04-09 DIAGNOSIS — Z79890 Hormone replacement therapy (postmenopausal): Secondary | ICD-10-CM

## 2022-04-09 DIAGNOSIS — Z7901 Long term (current) use of anticoagulants: Secondary | ICD-10-CM

## 2022-04-09 DIAGNOSIS — E039 Hypothyroidism, unspecified: Secondary | ICD-10-CM | POA: Diagnosis present

## 2022-04-09 DIAGNOSIS — N1831 Chronic kidney disease, stage 3a: Secondary | ICD-10-CM | POA: Diagnosis present

## 2022-04-09 DIAGNOSIS — I110 Hypertensive heart disease with heart failure: Secondary | ICD-10-CM | POA: Diagnosis not present

## 2022-04-09 DIAGNOSIS — I4819 Other persistent atrial fibrillation: Secondary | ICD-10-CM | POA: Diagnosis present

## 2022-04-09 DIAGNOSIS — E559 Vitamin D deficiency, unspecified: Secondary | ICD-10-CM | POA: Diagnosis present

## 2022-04-09 DIAGNOSIS — G4733 Obstructive sleep apnea (adult) (pediatric): Secondary | ICD-10-CM | POA: Diagnosis not present

## 2022-04-09 DIAGNOSIS — I509 Heart failure, unspecified: Principal | ICD-10-CM

## 2022-04-09 DIAGNOSIS — R457 State of emotional shock and stress, unspecified: Secondary | ICD-10-CM | POA: Diagnosis not present

## 2022-04-09 DIAGNOSIS — R0602 Shortness of breath: Secondary | ICD-10-CM | POA: Diagnosis not present

## 2022-04-09 DIAGNOSIS — J9601 Acute respiratory failure with hypoxia: Secondary | ICD-10-CM | POA: Diagnosis present

## 2022-04-09 DIAGNOSIS — F40240 Claustrophobia: Secondary | ICD-10-CM | POA: Diagnosis present

## 2022-04-09 DIAGNOSIS — Z6841 Body Mass Index (BMI) 40.0 and over, adult: Secondary | ICD-10-CM

## 2022-04-09 DIAGNOSIS — R0902 Hypoxemia: Secondary | ICD-10-CM | POA: Diagnosis not present

## 2022-04-09 DIAGNOSIS — D6869 Other thrombophilia: Secondary | ICD-10-CM | POA: Diagnosis not present

## 2022-04-09 LAB — COMPREHENSIVE METABOLIC PANEL
ALT: 14 U/L (ref 0–44)
AST: 20 U/L (ref 15–41)
Albumin: 3.4 g/dL — ABNORMAL LOW (ref 3.5–5.0)
Alkaline Phosphatase: 99 U/L (ref 38–126)
Anion gap: 11 (ref 5–15)
BUN: 20 mg/dL (ref 8–23)
CO2: 21 mmol/L — ABNORMAL LOW (ref 22–32)
Calcium: 8.4 mg/dL — ABNORMAL LOW (ref 8.9–10.3)
Chloride: 106 mmol/L (ref 98–111)
Creatinine, Ser: 0.96 mg/dL (ref 0.44–1.00)
GFR, Estimated: >60 mL/min (ref >60)
Glucose, Bld: 163 mg/dL — ABNORMAL HIGH (ref 70–99)
Potassium: 4.1 mmol/L (ref 3.5–5.1)
Sodium: 138 mmol/L (ref 135–145)
Total Bilirubin: 0.9 mg/dL (ref 0.3–1.2)
Total Protein: 6.8 g/dL (ref 6.5–8.1)

## 2022-04-09 LAB — CBC
HCT: 39.7 % (ref 36.0–46.0)
Hemoglobin: 11.8 g/dL — ABNORMAL LOW (ref 12.0–15.0)
MCH: 24.6 pg — ABNORMAL LOW (ref 26.0–34.0)
MCHC: 29.7 g/dL — ABNORMAL LOW (ref 30.0–36.0)
MCV: 82.7 fL (ref 80.0–100.0)
Platelets: 372 10*3/uL (ref 150–400)
RBC: 4.8 MIL/uL (ref 3.87–5.11)
RDW: 15.6 % — ABNORMAL HIGH (ref 11.5–15.5)
WBC: 21.9 10*3/uL — ABNORMAL HIGH (ref 4.0–10.5)
nRBC: 0 % (ref 0.0–0.2)

## 2022-04-09 LAB — TROPONIN I (HIGH SENSITIVITY)
Troponin I (High Sensitivity): 117 ng/L (ref <18)
Troponin I (High Sensitivity): 292 ng/L (ref <18)
Troponin I (High Sensitivity): 876 ng/L (ref <18)
Troponin I (High Sensitivity): 716 ng/L (ref <18)

## 2022-04-09 LAB — ECHOCARDIOGRAM COMPLETE
Area-P 1/2: 4.99 cm2
Calc EF: 43 %
MV M vel: 4.22 m/s
MV Peak grad: 71.2 mmHg
P 1/2 time: 255 msec
S' Lateral: 3.7 cm
Single Plane A2C EF: 38 %
Single Plane A4C EF: 45.4 %

## 2022-04-09 LAB — BRAIN NATRIURETIC PEPTIDE: B Natriuretic Peptide: 702.6 pg/mL — ABNORMAL HIGH (ref 0.0–100.0)

## 2022-04-09 MED ORDER — ONDANSETRON HCL 4 MG/2ML IJ SOLN
4.0000 mg | Freq: Four times a day (QID) | INTRAMUSCULAR | Status: DC | PRN
  Administered 2022-04-10: 4 mg via INTRAVENOUS
  Filled 2022-04-09: qty 2

## 2022-04-09 MED ORDER — MAGNESIUM OXIDE -MG SUPPLEMENT 400 (240 MG) MG PO TABS
400.0000 mg | ORAL_TABLET | Freq: Every day | ORAL | Status: DC
  Administered 2022-04-09 – 2022-04-12 (×4): 400 mg via ORAL
  Filled 2022-04-09 (×4): qty 1

## 2022-04-09 MED ORDER — APIXABAN 5 MG PO TABS
5.0000 mg | ORAL_TABLET | Freq: Two times a day (BID) | ORAL | Status: DC
  Administered 2022-04-09 – 2022-04-10 (×3): 5 mg via ORAL
  Filled 2022-04-09 (×3): qty 1

## 2022-04-09 MED ORDER — LEVOTHYROXINE SODIUM 50 MCG PO TABS
50.0000 ug | ORAL_TABLET | Freq: Every day | ORAL | Status: DC
  Administered 2022-04-09 – 2022-04-12 (×4): 50 ug via ORAL
  Filled 2022-04-09 (×4): qty 1

## 2022-04-09 MED ORDER — PANTOPRAZOLE SODIUM 40 MG PO TBEC
40.0000 mg | DELAYED_RELEASE_TABLET | Freq: Every day | ORAL | Status: DC
  Administered 2022-04-09 – 2022-04-12 (×4): 40 mg via ORAL
  Filled 2022-04-09 (×5): qty 1

## 2022-04-09 MED ORDER — FLECAINIDE ACETATE 50 MG PO TABS
50.0000 mg | ORAL_TABLET | Freq: Two times a day (BID) | ORAL | Status: DC
  Administered 2022-04-09 – 2022-04-10 (×3): 50 mg via ORAL
  Filled 2022-04-09 (×3): qty 1

## 2022-04-09 MED ORDER — TRAZODONE HCL 50 MG PO TABS: 25.0000 mg | ORAL_TABLET | Freq: Every evening | ORAL | Status: DC | PRN

## 2022-04-09 MED ORDER — LORAZEPAM 2 MG/ML IJ SOLN
1.0000 mg | Freq: Once | INTRAMUSCULAR | Status: DC
  Filled 2022-04-09: qty 1

## 2022-04-09 MED ORDER — ACETAMINOPHEN ER 650 MG PO TBCR: 1300.0000 mg | EXTENDED_RELEASE_TABLET | Freq: Three times a day (TID) | ORAL | Status: DC | PRN

## 2022-04-09 MED ORDER — POLYETHYLENE GLYCOL 3350 17 G PO PACK
17.0000 g | PACK | Freq: Every day | ORAL | Status: DC | PRN
  Administered 2022-04-10: 17 g via ORAL
  Filled 2022-04-09: qty 1

## 2022-04-09 MED ORDER — METOPROLOL SUCCINATE ER 50 MG PO TB24
50.0000 mg | ORAL_TABLET | Freq: Every evening | ORAL | Status: DC
  Administered 2022-04-09: 50 mg via ORAL
  Filled 2022-04-09: qty 1

## 2022-04-09 MED ORDER — METOPROLOL SUCCINATE ER 100 MG PO TB24
100.0000 mg | ORAL_TABLET | Freq: Every morning | ORAL | Status: DC
  Administered 2022-04-09 – 2022-04-12 (×4): 100 mg via ORAL
  Filled 2022-04-09 (×4): qty 1

## 2022-04-09 MED ORDER — SODIUM CHLORIDE 0.9% FLUSH
3.0000 mL | Freq: Two times a day (BID) | INTRAVENOUS | Status: DC
  Administered 2022-04-09 – 2022-04-11 (×4): 3 mL via INTRAVENOUS

## 2022-04-09 MED ORDER — ONDANSETRON HCL 4 MG/2ML IJ SOLN
4.0000 mg | Freq: Once | INTRAMUSCULAR | Status: AC
  Administered 2022-04-09: 4 mg via INTRAVENOUS
  Filled 2022-04-09: qty 2

## 2022-04-09 MED ORDER — HYDRALAZINE HCL 20 MG/ML IJ SOLN: 5.0000 mg | INTRAMUSCULAR | Status: DC | PRN

## 2022-04-09 MED ORDER — ACETAMINOPHEN 500 MG PO TABS
1000.0000 mg | ORAL_TABLET | Freq: Three times a day (TID) | ORAL | Status: DC | PRN
  Administered 2022-04-09 – 2022-04-12 (×6): 1000 mg via ORAL
  Filled 2022-04-09 (×6): qty 2

## 2022-04-09 MED ORDER — ATORVASTATIN CALCIUM 10 MG PO TABS
10.0000 mg | ORAL_TABLET | Freq: Every day | ORAL | Status: DC
  Administered 2022-04-09 – 2022-04-10 (×2): 10 mg via ORAL
  Filled 2022-04-09 (×2): qty 1

## 2022-04-09 MED ORDER — DOCUSATE SODIUM 100 MG PO CAPS
100.0000 mg | ORAL_CAPSULE | Freq: Two times a day (BID) | ORAL | Status: DC
  Administered 2022-04-09 – 2022-04-12 (×5): 100 mg via ORAL
  Filled 2022-04-09 (×5): qty 1

## 2022-04-09 MED ORDER — ONDANSETRON HCL 4 MG PO TABS: 4.0000 mg | ORAL_TABLET | Freq: Four times a day (QID) | ORAL | Status: DC | PRN

## 2022-04-09 MED ORDER — LORATADINE 10 MG PO TABS
10.0000 mg | ORAL_TABLET | Freq: Every day | ORAL | Status: DC
  Administered 2022-04-09 – 2022-04-12 (×4): 10 mg via ORAL
  Filled 2022-04-09 (×4): qty 1

## 2022-04-09 MED ORDER — VERAPAMIL HCL ER 180 MG PO TBCR
180.0000 mg | EXTENDED_RELEASE_TABLET | Freq: Every day | ORAL | Status: DC
  Administered 2022-04-10: 180 mg via ORAL
  Filled 2022-04-09 (×2): qty 1

## 2022-04-09 MED ORDER — OXYCODONE HCL 5 MG PO TABS: 5.0000 mg | ORAL_TABLET | ORAL | Status: DC | PRN

## 2022-04-09 MED ORDER — FLUTICASONE PROPIONATE 50 MCG/ACT NA SUSP
2.0000 | Freq: Every day | NASAL | Status: DC
  Administered 2022-04-09 – 2022-04-11 (×3): 2 via NASAL
  Filled 2022-04-09 (×2): qty 16

## 2022-04-09 MED ORDER — FLUTICASONE PROPIONATE HFA 44 MCG/ACT IN AERO: 2.0000 | INHALATION_SPRAY | Freq: Two times a day (BID) | RESPIRATORY_TRACT | Status: DC

## 2022-04-09 MED ORDER — FUROSEMIDE 10 MG/ML IJ SOLN: 40.0000 mg | Freq: Two times a day (BID) | INTRAMUSCULAR | Status: DC

## 2022-04-09 MED ORDER — FUROSEMIDE 10 MG/ML IJ SOLN
60.0000 mg | Freq: Two times a day (BID) | INTRAMUSCULAR | Status: DC
  Administered 2022-04-09 – 2022-04-10 (×2): 60 mg via INTRAVENOUS
  Filled 2022-04-09 (×2): qty 6

## 2022-04-09 MED ORDER — FUROSEMIDE 10 MG/ML IJ SOLN
20.0000 mg | Freq: Once | INTRAMUSCULAR | Status: AC
  Administered 2022-04-09: 20 mg via INTRAVENOUS
  Filled 2022-04-09: qty 2

## 2022-04-09 MED ORDER — BUDESONIDE 0.25 MG/2ML IN SUSP
0.2500 mg | Freq: Two times a day (BID) | RESPIRATORY_TRACT | Status: DC
  Administered 2022-04-09 – 2022-04-12 (×6): 0.25 mg via RESPIRATORY_TRACT
  Filled 2022-04-09 (×6): qty 2

## 2022-04-09 MED ORDER — BISACODYL 5 MG PO TBEC: 5.0000 mg | DELAYED_RELEASE_TABLET | Freq: Every day | ORAL | Status: DC | PRN

## 2022-04-09 NOTE — Progress Notes (Signed)
  Echocardiogram 2D Echocardiogram has been performed.  Leslie [name] 04/09/2022, 1:28 PM

## 2022-04-09 NOTE — ED Notes (Signed)
Pt in room A&O x4 talking on the phone. Updated on plan of care. Pt denies any SOB at this time. O2 in place at 6L [place]. Pt having slight pain due to arthritis.

## 2022-04-09 NOTE — Progress Notes (Signed)
RT attempted bipap with pt. Per md request. Pt. Could not tolerate the mask. Pt. Stated that she has really bad claustrophobia. MD aware.

## 2022-04-09 NOTE — Consult Note (Addendum)
Cardiology Consultation   Patient ID: ZALEIGH BERMINGHAM MRN: [id number]; DOB: [birth year]  Admit date: 04/09/2022 Date of Consult: 04/09/2022  PCP:  Glenis Smoker, MD   [place] Prov[facility] Cardiologist:  Sanda Klein, MD  Patient Profile:   Leslie [name] is a 76 y.o. female with a hx of persistent afib, HFpEF, GERD, HTN, HLD, CKD stage III, mild MR who is being seen 04/09/2022 for the evaluation of elevated troponin/CHF at the request of Dr. Lorin Mercy.  History of Present Illness:   Leslie [name] is a 76 year old female with past medical history noted above.  She has been followed by Dr. Sallyanne Kuster as an outpatient.  Initially presented with atrial fibrillation 07/2016 and had a difficult time with rate control and underwent TEE guided cardioversion.  Beta-blocker dose was reduced the following month when she had junctional rhythms.  After she received the second dose of the COVID-vaccine 05/2019 she developed symptomatic persistent atrial fibrillation with RVR and again this was challenging to rate control and underwent cardioversion 06/2019. In January 2022 she had a recurrence of atrial fibrillation and was started on flecainide with repeat cardioversion in February but developed pulmonary edema and required brief hospitalization.  Underwent nuclear stress testing 05/2020 with no evidence of ischemia, echocardiogram at that time showed LVEF of 55 to 60% with mild MR, moderate aortic regurgitation unchanged from prior echoes.  Also has a history of iron deficiency anemia and receives iron infusions as she is intolerant of oral iron due to constipation.  Last seen in the office 10/2021 and reported no recurrence of atrial fibrillation.  No recurrent palpitations and was currently wearing a smart watch.  She was continued on flecainide and metoprolol as well as verapamil without any symptomatic bradycardia.  Continued on Eliquis 5 mg twice daily.  She appeared well compensated from a  heart failure standpoint at this visit.  Presents to the ED 12/26 with complaints of progressive shortness of breath and edema several days prior to presentation. Had felt somewhat short of breath, fatigued. Had been compliant with her medications. Was having decent UOP at home. Had not been sticking to low sodium diet in the setting of the holidays. Developed acute shortness of breath the evening of admission.  She was noted to be significantly hypertensive with BP 230/110 as well as hypoxic with room air sats in the 60s on EMS arrival.  She was trialed on BiPAP but did not tolerate secondary to claustrophobia.  Labs in the ED showed sodium 138, potassium 4.1, creatinine 0.9, BNP 702, high-sensitivity troponin 117>> 292, WBC 21, hemoglobin 11.8.  Chest x-ray with vascular congestion and edema, unable to exclude pneumonia.  EKG with sinus tachycardia, 113 bpm.  She was given IV Lasix in the ED. admitted to internal medicine for further management.  Cardiology asked to evaluate.   Past Medical History:  Diagnosis Date   Adenomatous polyp    Arthritis    "knees, legs, fingers" (07/30/2016)   Asthma    Bursitis of left shoulder    "just finished PT" (07/30/2016)   Chest pain    a. 2003 Abnl stress test-->Cath: nonobs CAD.   Chronic bronchitis (HCC)    Chronic kidney disease    stage 3   GERD (gastroesophageal reflux disease)    barrett's esophagus- Dr. Cristina Gong   History of hiatal hernia    Hyperlipidemia    Hypertension    Hypothyroidism    Migraines    "sporatic; at least a few/year" (07/30/2016)  Mitral regurgitation    a. 07/2016 Echo: mild to mod MR;  b. 07/2016 TEE: mild MR.   Moderate aortic insufficiency    a. 07/2016 Echo: EF 55-60%, mod AI;  b. 07/2016 TEE: EF 50-55%, mod AI.   Obesity    s/p lap band surgery 6/10   OSA on CPAP    PAF (paroxysmal atrial fibrillation) ([place])    a. 07/2016 TEE/DCCV: EF 50-55%, mild MR, mod AI, mild to mod TR, neg bubble study-->successful DCCV x1 w/  120J.   PONV (postoperative nausea and vomiting)    Vitamin D deficiency     Past Surgical History:  Procedure Laterality Date   APPENDECTOMY     incidental   CARDIOVERSION N/A 08/01/2016   Procedure: CARDIOVERSION;  Surgeon: Jerline Pain, MD;  Location[facility] ENDOSCOPY;  Service: Cardiovascular;  Laterality: N/A;   CARDIOVERSION N/A 06/24/2019   Procedure: CARDIOVERSION;  Surgeon: Sanda Klein, MD;  Location: [place];  Service: Cardiovascular;  Laterality: N/A;   CARDIOVERSION N/A 05/30/2020   Procedure: CARDIOVERSION;  Surgeon: Sanda Klein, MD;  Location: [place] ENDOSCOPY;  Service: Cardiovascular;  Laterality: N/A;   CARDIOVERSION N/A 05/09/2020   Procedure: CARDIOVERSION;  Surgeon: [name], MD;  Location: [place];  Service: Cardiovascular;  Laterality: N/A;   CATARACT EXTRACTION W/ INTRAOCULAR LENS  IMPLANT, BILATERAL Bilateral ~ 2008-2017   left - right   COMBINED HYSTERECTOMY VAGINAL / OOPHORECTOMY / A&P [place]  09/2008   TEE WITHOUT CARDIOVERSION N/A 08/01/2016   Procedure: TRANSESOPHAGEAL ECHOCARDIOGRAM (TEE);  Surgeon: Jerline Pain, MD;  Location: [place];  Service: Cardiovascular;  Laterality: N/A;   [place]   "w/1 ovary"     Home Medications:  Prior to Admission medications   Medication Sig Start Date End Date Taking? Authorizing Provider  acetaminophen (TYLENOL) 650 MG CR tablet Take 1,300 mg by mouth every 8 (eight) hours as needed for pain.   Yes [provider]  atorvastatin (LIPITOR) 10 MG tablet Take 10 mg by mouth daily.   Yes [provider]  Bacillus Coagulans-Inulin (ALIGN PREBIOTIC-PROBIOTIC PO) Take 2 capsules by mouth daily. Gummy   Yes [provider]  cholecalciferol (VITAMIN D) 1000 UNITS tablet Take 1,000 Units by mouth daily.    Yes [provider]  diclofenac Sodium (VOLTAREN) 1 % GEL Apply 1 application topically 2 (two) times daily.   Yes [provider]  ELIQUIS 5 MG TABS tablet TAKE 1 TABLET BY MOUTH TWICE DAILY 12/31/21  Yes Croitoru, Mihai, MD  fexofenadine (ALLEGRA) 180 MG tablet Take 180 mg by mouth daily.   Yes [provider]  flecainide (TAMBOCOR) 50 MG tablet TAKE 1 TABLET(50 MG) BY MOUTH TWICE DAILY Patient taking differently: Take 50 mg by mouth 2 (two) times daily. 09/07/21  Yes Croitoru, Mihai, MD  fluticasone (FLONASE) 50 MCG/ACT nasal spray Place 2 sprays into both nostrils at bedtime.    Yes [provider]  fluticasone (FLOVENT HFA) 44 MCG/ACT inhaler Inhale 2 puffs into the lungs 2 (two) times daily. Patient taking differently: Inhale 2 puffs into the lungs at bedtime. May spray 2 sprays inhaled every morning as needed for allergy sypmtoms 03/04/18  Yes Croitoru, Mihai, MD  furosemide (LASIX) 20 MG tablet Take 1 tablet (20 mg total) by mouth daily. 03/22/21  Yes  Croitoru, Mihai, MD  Glucosamine-Chondroitin (OSTEO BI-FLEX REGULAR STRENGTH PO) Take 1 tablet by mouth daily.   Yes [provider]  levothyroxine (SYNTHROID, LEVOTHROID) 50 MCG tablet Take 50 mcg by mouth daily before breakfast.   Yes [provider]  magnesium oxide (MAG-OX) 400 (240 Mg) MG tablet Take 1 tablet (400 mg total) by mouth daily. 06/07/21  Yes Croitoru, Mihai, MD  metoprolol succinate (TOPROL-XL) 100 MG 24 hr tablet Take 100 mg (one tablet) in the morning and 50 mg (half a tablet) in the evening. 10/12/21  Yes Croitoru, Mihai, MD  metroNIDAZOLE (METROCREAM) 0.75 % cream Apply 1 application topically daily. 05/16/20  Yes [provider]  mometasone (ELOCON) 0.1 % cream Apply 1 application topically 2 (two) times daily as needed (skin irritation). 05/18/20  Yes [provider]  Multiple Vitamins-Minerals (CENTRUM MULTIGUMMIES) CHEW Chew 1 tablet by mouth daily.   Yes  [provider]  omeprazole (PRILOSEC) 20 MG capsule Take 40 mg by mouth daily before breakfast. 07/27/16  Yes [provider]  senna (SENOKOT) 8.6 MG tablet Take 1 tablet by mouth every evening.   Yes [provider]  verapamil (CALAN-SR) 180 MG CR tablet TAKE 1 TABLET(180 MG) BY MOUTH AT BEDTIME Patient taking differently: Take 180 mg by mouth daily. 01/16/22  Yes Croitoru, Mihai, MD    Inpatient Medications: Scheduled Meds:  apixaban  5 mg Oral BID   atorvastatin  10 mg Oral Daily   docusate sodium  100 mg Oral BID   flecainide  50 mg Oral BID   fluticasone  2 spray Each Nare QHS   fluticasone  2 puff Inhalation BID   furosemide  40 mg Intravenous BID   levothyroxine  50 mcg Oral QAC breakfast   loratadine  10 mg Oral Daily   magnesium oxide  400 mg Oral Daily   metoprolol succinate  100 mg Oral q AM   metoprolol succinate  50 mg Oral QPM   pantoprazole  40 mg Oral Daily   sodium chloride flush  3 mL Intravenous Q12H   verapamil  180 mg Oral Daily   Continuous Infusions:  PRN Meds: acetaminophen, bisacodyl, hydrALAZINE, ondansetron **OR** ondansetron (ZOFRAN) IV, oxyCODONE, polyethylene glycol, traZODone  Allergies:    Allergies  Allergen Reactions   Clindamycin Hcl     Lip and face swelling   Augmentin [Amoxicillin-Pot Clavulanate] Diarrhea    Did it involve swelling of the face/tongue/throat, SOB, or low BP? No Did it involve sudden or severe rash/hives, skin peeling, or any reaction on the inside of your mouth or nose? No Did you need to seek medical attention at a hospital or doctor's office? No When did it last happen?      10 + years If all above answers are "NO", may proceed with cephalosporin use.    Codeine Nausea Only and Other (See Comments)    Hyperactivity   Demerol [Meperidine] Nausea And Vomiting   Sulfa Antibiotics Rash    Social History:   Social History   Socioeconomic History   Marital status: Divorced    Spouse name:  Not on file   Number of children: Not on file   Years of education: Not on file   Highest education level: Not on file  Occupational History   Not on file  Tobacco Use   Smoking status: Former   Smokeless tobacco: Never   Tobacco comments:    07/30/2016 "social smoker only; in college"  Vaping Use   Vaping  Use: Never used  Substance and Sexual Activity   Alcohol use: Yes    Alcohol/week: 2.0 standard drinks of alcohol    Types: 1 Glasses of wine, 1 Standard drinks or equivalent per week    Comment: 07/30/2016 "couple drinks/month"   Drug use: No   Sexual activity: Not Currently  Other Topics Concern   Not on file  Social History Narrative   Not on file   Social Determinants of Health   Financial Resource Strain: Not on file  Food Insecurity: No Food Insecurity (02/22/2022)   Hunger Vital Sign    Worried About Running Out of Food in the Last Year: Never true    Ran Out of Food in the Last Year: Never true  Transportation Needs: No Transportation Needs (02/22/2022)   PRAPARE - Hydrologist (Medical): No    Lack of Transportation (Non-Medical): No  Physical Activity: Not on file  Stress: Not on file  Social Connections: Not on file  Intimate Partner Violence: Not on file    Family History:    Family History  Problem Relation Age of Onset   Heart failure Mother    Hypertension Mother    Hypertension Father    Emphysema Father      ROS:  Please see the history of present illness.   All other ROS reviewed and negative.     Physical Exam/Data:   Vitals:   04/09/22 0600 04/09/22 0615 04/09/22 0630 04/09/22 0730  BP: (!) 130/90 (!) 138/103 (!) 130/95 (!) 138/105  Pulse: 81 (!) 116 86 (!) 113  Resp: (!) 21 (!) 22 20 (!) 22  Temp:      TempSrc:      SpO2: 97% 95% 95% 94%   No intake or output data in the 24 hours ending 04/09/22 1006    10/25/2021   10:56 AM 04/24/2021    3:40 PM 03/22/2021   10:48 AM  Last 3 Weights  Weight (lbs)  273 lb 6.4 oz 274 lb 271 lb 3.2 oz  Weight (kg) 124.013 kg 124.286 kg 123.016 kg     There is no height or weight on file to calculate BMI.  General:  Well nourished, well developed, slight short of breath, wearing [place] HEENT: normal Neck: no JVD Vascular: No carotid bruits; Distal pulses 2+ bilaterally Cardiac:  normal S1, S2; tachycardic; systolic murmur  Lungs:  diminished in bases bilaterally  Abd: soft, nontender, no hepatomegaly  Ext: 1+ LE edema Musculoskeletal:  No deformities, BUE and BLE strength normal and equal Skin: warm and dry  Neuro:  CNs 2-12 intact, no focal abnormalities noted Psych:  Normal affect   EKG:  The EKG was personally reviewed and demonstrates:  sinus tachycardia, 113 bpm Telemetry:  Telemetry was personally reviewed and demonstrates:  sinus tachycardia   Relevant CV Studies:  Echo: 05/2020  IMPRESSIONS     1. Left ventricular ejection fraction, by estimation, is 55 to 60%. The  left ventricle has normal function. The left ventricle has no regional  wall motion abnormalities. Left ventricular diastolic parameters are  consistent with Grade II diastolic  dysfunction (pseudonormalization).   2. Right ventricular systolic function is normal. The right ventricular  size is normal. There is normal pulmonary artery systolic pressure.   3. Left atrial size was moderately dilated.   4. The mitral valve is normal in structure. Mild mitral valve  regurgitation. No evidence of mitral stenosis.   5. The aortic valve is tricuspid.  Aortic valve regurgitation is moderate.  No aortic stenosis is present.   6. The inferior vena cava is normal in size with greater than 50%  respiratory variability, suggesting right atrial pressure of 3 mmHg.   Comparison(s): No significant change from prior study.   FINDINGS   Left Ventricle: Left ventricular ejection fraction, by estimation, is 55  to 60%. The left ventricle has normal function. The left ventricle has no   regional wall motion abnormalities. The left ventricular internal cavity  size was normal in size. There is   no left ventricular hypertrophy. Left ventricular diastolic parameters  are consistent with Grade II diastolic dysfunction (pseudonormalization).   Right Ventricle: The right ventricular size is normal. Right vetricular  wall thickness was not well visualized. Right ventricular systolic  function is normal. There is normal pulmonary artery systolic pressure.  The tricuspid regurgitant velocity is 1.89  m/s, and with an assumed right atrial pressure of 3 mmHg, the estimated  right ventricular systolic pressure is 56.3 mmHg.   Left Atrium: Left atrial size was moderately dilated.   Right Atrium: Right atrial size was normal in size.   Pericardium: There is no evidence of pericardial effusion. Presence of  pericardial fat pad.   Mitral Valve: The mitral valve is normal in structure. Mild mitral valve  regurgitation. No evidence of mitral valve stenosis.   Tricuspid Valve: The tricuspid valve is normal in structure. Tricuspid  valve regurgitation is mild . No evidence of tricuspid stenosis.   Aortic Valve: The aortic valve is tricuspid. Aortic valve regurgitation is  moderate. Aortic regurgitation PHT measures 682 msec. No aortic stenosis  is present.   Pulmonic Valve: The pulmonic valve was not well visualized. Pulmonic valve  regurgitation is trivial. No evidence of pulmonic stenosis.   Aorta: The aortic root, ascending aorta and aortic arch are all  structurally normal, with no evidence of dilitation or obstruction.   Venous: The inferior vena cava is normal in size with greater than 50%  respiratory variability, suggesting right atrial pressure of 3 mmHg.   IAS/Shunts: The atrial septum is grossly normal.       Laboratory Data:  High Sensitivity Troponin:   Recent Labs  Lab 04/09/22 0255 04/09/22 0430  TROPONINIHS 117* 292*     Chemistry Recent Labs  Lab  04/09/22 0255  NA 138  K 4.1  CL 106  CO2 21*  GLUCOSE 163*  BUN 20  CREATININE 0.96  CALCIUM 8.4*  GFRNONAA >60  ANIONGAP 11    Recent Labs  Lab 04/09/22 0255  PROT 6.8  ALBUMIN 3.4*  AST 20  ALT 14  ALKPHOS 99  BILITOT 0.9   Lipids No results for input(s): "CHOL", "TRIG", "HDL", "LABVLDL", "LDLCALC", "CHOLHDL" in the last 168 hours.  Hematology Recent Labs  Lab 04/09/22 0255  WBC 21.9*  RBC 4.80  HGB 11.8*  HCT 39.7  MCV 82.7  MCH 24.6*  MCHC 29.7*  RDW 15.6*  PLT 372   Thyroid No results for input(s): "TSH", "FREET4" in the last 168 hours.  BNP Recent Labs  Lab 04/09/22 0255  BNP 702.6*    DDimer No results for input(s): "DDIMER" in the last 168 hours.   Radiology/Studies:  DG Chest 2 View  Result Date: 04/09/2022 CLINICAL DATA:  Shortness of breath. EXAM: CHEST - 2 VIEW COMPARISON:  Chest radiograph dated 05/30/2020. FINDINGS: Cardiomegaly with vascular congestion and edema. Pneumonia is not excluded clinical correlation is recommended. A small right pleural effusion may be  present. No pneumothorax. No acute osseous pathology. IMPRESSION: Cardiomegaly with vascular congestion and edema. Pneumonia is not excluded. Electronically Signed   By: Anner Crete M.D.   On: 04/09/2022 02:45     Assessment and Plan:   GOWRI SUCHAN is a 76 y.o. female with a hx of persistent afib, HFpEF, GERD, HTN, HLD, CKD stage III, mild MR who is being seen 04/09/2022 for the evaluation of elevated troponin/CHF at the request of Dr. Lorin Mercy.  Acute hypoxic respiratory failure HFpEF -- developed acute dyspnea the evening of 12/25, noted to severely hypertensive on EMS arrival and hypoxic with sats in the 60s. CXR with edema, unable to exclude PNA. BNP >700. Attempted BIPAP but unable to tolerate, currently on [place]@ 6L. BP improved -- IV lasix, will increase to '60mg'$  BID -- monitor I&Os -- echo pending  Hypertensive urgency  -- blood pressures severely elevated on  admission, >287 systolic -- now improved -- continue metoprolol, verapmil  Persistent Afib -- multiple cardioversion in the past -- currently in SR -- continue flecainide, metoprolol, Eliquis   Elevated troponin -- hsTn 117>>292 in the setting of HTN urgency, CHF. Denies any chest pain -- echo pending  Valvular disease -- Mild MR/Moderate aortic regurg on echo 05/2020 -- repeat echo pending  OSA -- reports compliance with Cpap  Risk Assessment/Risk Scores:    New York Heart Association (NYHA) Functional Class NYHA Class III  CHA2DS2-VASc Score = 5   This indicates a 7.2% annual risk of stroke. The patient's score is based upon: CHF History: 1 HTN History: 1 Diabetes History: 0 Stroke History: 0 Vascular Disease History: 0 Age Score: 2 Gender Score: 1   For questions or updates, please contact [place] Please consult [email or web address] for contact info under    Signed, Reino Bellis, NP  04/09/2022 10:06 AM  I have personally seen and examined this patient. I agree with the assessment and plan as outlined above.  76 yo female with history of atrial fib, normal LV function, chronic diastolic CHF, GERD, HTN, HLD, CKD admitted with hypertensive urgency and acute respiratory failure secondary to flash pulmonary edema. Mild troponin elevation. No chest pain. She was diuresed and is feeling better  Labs reviewed EKG reviewed by me and shows sinus tachy with no ischemic changes My exam: Obese female in NAD. OM:[facility], soft systolic murmur. Lungs with basilar crackles. Ext: 1+ bilateral LE edema Plan: Acute on chronic diastolic CHF: Evidence of volume overload exacerbating by elevated BP. BP is much better today. Agree with continuing IV Lasix for diuresis. Echo today. We will follow with you.   Lauree Chandler, MD, [facility] 04/09/2022 11:43 AM

## 2022-04-09 NOTE — H&P (Signed)
History and Physical    Patient: Leslie [name] MGN:[id number] DOB: 08-Jan-1946 DOA: 04/09/2022 DOS: the patient was seen and examined on 04/09/2022 PCP: Glenis Smoker, MD  Patient coming from: Home - lives alone; NOK: Daughter, Gwynneth Aliment, NP, [phone]   Chief Complaint: SOB  HPI: Leslie [name] is a 76 y.o. female with medical history significant of stage 3 CKD; HTN; HLD: hypothyroidism; OSA on CPAP; afib; and morbid obesity s/p lap band surgery presenting with SOB, weight gain.  She was 67% on RA on arrival.   She reports that she couldn't breathe.  Symptoms started around dinnertime, she felt "weird."  She tried to lie down for bed and was SOB.  She called the cardiologist and they suggested she come in.  She had a similar presentation a year ago.  By the time EMS arrived, she was too SOB to get up and open the door.  No cough.  She has had a couple of pounds of weight gain the last couple of days.  The last time this happened she had a cardioversion that day.  She was a little SOB last week but then was fine so wasn't worried about it.  She is having reflux and mild chest discomfort but not chest pain per se.  She was placed on BIPAP upon arrival but was immediately unable to tolerate it and so was placed on NRB O2; she is currently on 6L [place] o2.  She does wear CPAP at home and will have her machine brought in.      ER Course:  Carryover, per Dr. Josephine Cables:   Apparently, patient has several days of increased shortness of breath and leg swelling.  This worsened yesterday in the afternoon with increased work of breathing.  EMS was activated and patient was sent to the ED for further evaluation and management.  She was noted to have O2 sat of 67% on room air. Concern for CHF.     Review of Systems: As mentioned in the history of present illness. All other systems reviewed and are negative. Past Medical History:  Diagnosis Date   Adenomatous polyp    Arthritis    "knees,  legs, fingers" (07/30/2016)   Asthma    Bursitis of left shoulder    "just finished PT" (07/30/2016)   Chest pain    a. 2003 Abnl stress test-->Cath: nonobs CAD.   Chronic bronchitis (HCC)    Chronic kidney disease    stage 3   GERD (gastroesophageal reflux disease)    barrett's esophagus- Dr. Cristina Gong   History of hiatal hernia    Hyperlipidemia    Hypertension    Hypothyroidism    Migraines    "sporatic; at least a few/year" (07/30/2016)   Mitral regurgitation    a. 07/2016 Echo: mild to mod MR;  b. 07/2016 TEE: mild MR.   Moderate aortic insufficiency    a. 07/2016 Echo: EF 55-60%, mod AI;  b. 07/2016 TEE: EF 50-55%, mod AI.   Obesity    s/p lap band surgery 6/10   OSA on CPAP    PAF (paroxysmal atrial fibrillation) ([place])    a. 07/2016 TEE/DCCV: EF 50-55%, mild MR, mod AI, mild to mod TR, neg bubble study-->successful DCCV x1 w/ 120J.   PONV (postoperative nausea and vomiting)    Vitamin D deficiency    Past Surgical History:  Procedure Laterality Date   APPENDECTOMY     incidental   CARDIOVERSION N/A 08/01/2016   Procedure: CARDIOVERSION;  Surgeon: Jerline Pain, MD;  Location: [place];  Service: Cardiovascular;  Laterality: N/A;   CARDIOVERSION N/A 06/24/2019   Procedure: CARDIOVERSION;  Surgeon: Sanda Klein, MD;  Location: [place];  Service: Cardiovascular;  Laterality: N/A;   CARDIOVERSION N/A 05/30/2020   Procedure: CARDIOVERSION;  Surgeon: Sanda Klein, MD;  Location: [place] ENDOSCOPY;  Service: Cardiovascular;  Laterality: N/A;   CARDIOVERSION N/A 05/09/2020   Procedure: CARDIOVERSION;  Surgeon: [name], MD;  Location: [place];  Service: Cardiovascular;  Laterality: N/A;   CATARACT EXTRACTION W/ INTRAOCULAR LENS  IMPLANT, BILATERAL Bilateral ~ 2008-2017   left - right   COMBINED HYSTERECTOMY VAGINAL / OOPHORECTOMY / A&P [place]   09/2008   TEE WITHOUT CARDIOVERSION N/A 08/01/2016   Procedure: TRANSESOPHAGEAL ECHOCARDIOGRAM (TEE);  Surgeon: Jerline Pain, MD;  Location: [place];  Service: Cardiovascular;  Laterality: N/A;   [place]   "w/1 ovary"   Social History:  reports that she has quit smoking. She has never used smokeless tobacco. She reports current alcohol use of about 2.0 standard drinks of alcohol per week. She reports that she does not use drugs.  Allergies  Allergen Reactions   Clindamycin Hcl     Lip and face swelling   Augmentin [Amoxicillin-Pot Clavulanate] Diarrhea    Did it involve swelling of the face/tongue/throat, SOB, or low BP? No Did it involve sudden or severe rash/hives, skin peeling, or any reaction on the inside of your mouth or nose? No Did you need to seek medical attention at a hospital or doctor's office? No When did it last happen?      10 + years If all above answers are "NO", may proceed with cephalosporin use.    Codeine Nausea Only and Other (See Comments)    Hyperactivity   Demerol [Meperidine] Nausea And Vomiting   Sulfa Antibiotics Rash    Family History  Problem Relation Age of Onset   Heart failure Mother    Hypertension Mother    Hypertension Father    Emphysema Father     Prior to Admission medications   Medication Sig Start Date End Date Taking? Authorizing Provider  acetaminophen (TYLENOL) 650 MG CR tablet Take 1,300 mg by mouth every 8 (eight) hours as needed for pain.   Yes [provider]  atorvastatin (LIPITOR) 10 MG tablet Take 10 mg by mouth daily.   Yes [provider]  Bacillus Coagulans-Inulin (ALIGN PREBIOTIC-PROBIOTIC PO) Take 2 capsules by mouth daily. Gummy   Yes [provider]  cholecalciferol (VITAMIN D) 1000 UNITS tablet Take 1,000 Units by mouth daily.   Yes [provider]  diclofenac Sodium (VOLTAREN) 1 % GEL Apply 1 application topically 2  (two) times daily.   Yes [provider]  ELIQUIS 5 MG TABS tablet TAKE 1 TABLET BY MOUTH TWICE DAILY 12/31/21  Yes Croitoru, Mihai, MD  fexofenadine (ALLEGRA) 180 MG tablet Take 180 mg by mouth daily.   Yes [provider]  flecainide (TAMBOCOR) 50 MG tablet TAKE 1 TABLET(50 MG) BY MOUTH TWICE DAILY Patient taking differently: Take 50 mg by mouth 2 (two) times daily. 09/07/21  Yes Croitoru, Mihai, MD  fluticasone (FLONASE) 50 MCG/ACT nasal spray Place 2 sprays into both nostrils at bedtime.    Yes [provider]  fluticasone ([place] HFA)  44 MCG/ACT inhaler Inhale 2 puffs into the lungs 2 (two) times daily. Patient taking differently: Inhale 2 puffs into the lungs at bedtime. May spray 2 sprays inhaled every morning as needed for allergy sypmtoms 03/04/18  Yes Croitoru, Mihai, MD  furosemide (LASIX) 20 MG tablet Take 1 tablet (20 mg total) by mouth daily. 03/22/21  Yes Croitoru, Mihai, MD  Glucosamine-Chondroitin (OSTEO BI-FLEX REGULAR STRENGTH PO) Take 1 tablet by mouth daily.   Yes [provider]  levothyroxine (SYNTHROID, LEVOTHROID) 50 MCG tablet Take 50 mcg by mouth daily before breakfast.   Yes [provider]  magnesium oxide (MAG-OX) 400 (240 Mg) MG tablet Take 1 tablet (400 mg total) by mouth daily. 06/07/21  Yes Croitoru, Mihai, MD  metoprolol succinate (TOPROL-XL) 100 MG 24 hr tablet Take 100 mg (one tablet) in the morning and 50 mg (half a tablet) in the evening. 10/12/21  Yes Croitoru, Mihai, MD  metroNIDAZOLE (METROCREAM) 0.75 % cream Apply 1 application topically daily. 05/16/20  Yes [provider]  mometasone (ELOCON) 0.1 % cream Apply 1 application topically 2 (two) times daily as needed (skin irritation). 05/18/20  Yes [provider]  Multiple Vitamins-Minerals (CENTRUM MULTIGUMMIES) CHEW Chew 1 tablet by mouth daily.   Yes [provider]  omeprazole (PRILOSEC) 20 MG capsule Take 40 mg by mouth daily before  breakfast. 07/27/16  Yes [provider]  senna (SENOKOT) 8.6 MG tablet Take 1 tablet by mouth every evening.   Yes [provider]  verapamil (CALAN-SR) 180 MG CR tablet TAKE 1 TABLET(180 MG) BY MOUTH AT BEDTIME Patient taking differently: Take 180 mg by mouth daily. 01/16/22  Yes Croitoru, Dani Gobble, MD    Physical Exam: Vitals:   04/09/22 0615 04/09/22 0630 04/09/22 0730 04/09/22 1014  BP: (!) 138/103 (!) 130/95 (!) 138/105   Pulse: (!) 116 86 (!) 113   Resp: (!) 22 20 (!) 22   Temp:    98 F (36.7 C)  TempSrc:    Oral  SpO2: 95% 95% 94%    General:  Appears calm and comfortable and is in NAD, anxious Eyes:  PERRL, EOMI, normal lids, iris ENT:  grossly normal hearing, lips & tongue, mmm; appropriate dentition Neck:  no LAD, masses or thyromegaly Cardiovascular:  RRR, no m/r/g. Tr LE edema.  Respiratory:   Bibasilar crackles extending 1/3 up lung fields, R > L.  Normal respiratory effort with O2 sat 94% on 6L  O2. Abdomen:  soft, NT, ND, obese Skin:  no rash or induration seen on limited exam Musculoskeletal:  grossly normal tone BUE/BLE, good ROM, no bony abnormality Psychiatric: anxious mood and affect, speech fluent and appropriate, AOx3 Neurologic:  CN 2-12 grossly intact, moves all extremities in coordinated fashion   Radiological Exams on Admission: Independently reviewed - see discussion in A/P where applicable  DG Chest 2 View  Result Date: 04/09/2022 CLINICAL DATA:  Shortness of breath. EXAM: CHEST - 2 VIEW COMPARISON:  Chest radiograph dated 05/30/2020. FINDINGS: Cardiomegaly with vascular congestion and edema. Pneumonia is not excluded clinical correlation is recommended. A small right pleural effusion may be present. No pneumothorax. No acute osseous pathology. IMPRESSION: Cardiomegaly with vascular congestion and edema. Pneumonia is not excluded. Electronically Signed   By: Anner Crete M.D.   On: 04/09/2022 02:45    EKG: Independently reviewed.   Sinus tachycardia with rate 113; nonspecific ST changes    Labs on Admission: I have personally reviewed the available labs and imaging studies at the  time of the admission.  Pertinent labs:    Glucose 163 Albumin 2.3 BNP 702.6; 306.6 in 05/2020 HS troponin 117, 292 WBC 21.9 Hgb 11.8   Assessment and Plan: Principal Problem:   Acute exacerbation of CHF (congestive heart failure) (HCC) Active Problems:   Essential hypertension, benign   Pure hypercholesterolemia   Morbid obesity (HCC)   Hypothyroidism   Chronic kidney disease, stage III (moderate) (HCC)   Persistent atrial fibrillation (HCC)   OSA (obstructive sleep apnea)    Acute on chronic diastolic CHF -Patient with known h/o chronic diastolic CHF (echo in 04/1029 with grade 2 diastolic failure) presenting with worsening SOB and hypoxia to the 60s -CXR consistent with mild pulmonary edema -Significantly elevated BNP compared to prior -With elevated BNP and abnl CXR, acute decompensated CHF seems probable as diagnosis -Will admit, as per the Emergency HF Mortality Risk Grade.  The patient has: severe pulmonary edema requiring new O2 therapy -Will request echocardiogram -CHF order set utilized -Cardiology is consulting -Was given Lasix 20 mg x 1 in ER and will repeat with 40 mg IV BID -Continue [place] O2 for now -Normal kidney function at this time, will follow -Mildly elevated HS troponin is likely related to demand ischemia; doubt ACS based on symptoms but it is still uptrending so will continue to follow and await cardiology input  HTN -Continue Toprol XL, verapamil -Will also add prn hydralazine  HLD -Continue Lipitor  Stage 3a CKD -Appears to be stable at this time -Attempt to avoid nephrotoxic medications -Recheck BMP in AM  -She is not on an ACE and may benefit from initiation of ACE/ARB  Hypothyroidism -Continue Synthroid at current dose for now   Afib -Continue flecainide, Toprol XL, verapamil -Continue  Eliquis  OSA -Continue CPAP, may use home machine  Morbid obesity -s/p lap band surgery  -Ongoing weight loss should be encouraged -Outpatient PCP/bariatric medicine/bariatric surgery f/u encouraged     Advance Care Planning:   Code Status: Full Code   Consults: Cardiology; CHF navigator; nutrition; PT/OT; TOC team  DVT Prophylaxis: Eliquis  Family Communication: None present; I spoke with her daughter, who is an NP in [name], by telephone at the time of admission  Severity of Illness: The appropriate patient status for this patient is INPATIENT. Inpatient status is judged to be reasonable and necessary in order to provide the required intensity of service to ensure the patient's safety. The patient's presenting symptoms, physical exam findings, and initial radiographic and laboratory data in the context of their chronic comorbidities is felt to place them at high risk for further clinical deterioration. Furthermore, it is not anticipated that the patient will be medically stable for discharge from the hospital within 2 midnights of admission.   * I certify that at the point of admission it is my clinical judgment that the patient will require inpatient hospital care spanning beyond 2 midnights from the point of admission due to high intensity of service, high risk for further deterioration and high frequency of surveillance required.*  Author: Karmen Bongo, MD 04/09/2022 10:34 AM  For on call review [email or web address].

## 2022-04-09 NOTE — ED Provider Notes (Signed)
[facility] Emergency Department Provider Note MRN:  [id number]  Arrival date & time: 04/09/22     Chief Complaint   Shortness of Breath   History of Present Illness   Leslie [name] is a 76 y.o. year-old female with a history of CHF, CKD presenting to the ED with chief complaint of shortness of breath.  Gradual onset shortness of breath starting earlier this afternoon, getting worse and worse.  Possibly a bit of an increase in fluid weight or retention over the past few days.  Denies fever or cough, no chest pain.  Review of Systems  A thorough review of systems was obtained and all systems are negative except as noted in the HPI and PMH.   Patient's Health History    Past Medical History:  Diagnosis Date   Adenomatous polyp    Arthritis    "knees, legs, fingers" (07/30/2016)   Asthma    Bursitis of left shoulder    "just finished PT" (07/30/2016)   Chest pain    a. 2003 Abnl stress test-->Cath: nonobs CAD.   Chronic bronchitis (HCC)    Chronic kidney disease    stage 3   GERD (gastroesophageal reflux disease)    barrett's esophagus- Dr. Cristina Gong   History of hiatal hernia    Hyperlipidemia    Hypertension    Hypothyroidism    Migraines    "sporatic; at least a few/year" (07/30/2016)   Mitral regurgitation    a. 07/2016 Echo: mild to mod MR;  b. 07/2016 TEE: mild MR.   Moderate aortic insufficiency    a. 07/2016 Echo: EF 55-60%, mod AI;  b. 07/2016 TEE: EF 50-55%, mod AI.   Obesity    s/p lap band surgery 6/10   OSA on CPAP    PAF (paroxysmal atrial fibrillation) ([place])    a. 07/2016 TEE/DCCV: EF 50-55%, mild MR, mod AI, mild to mod TR, neg bubble study-->successful DCCV x1 w/ 120J.   PONV (postoperative nausea and vomiting)    Vitamin D deficiency     Past Surgical History:  Procedure Laterality Date   APPENDECTOMY     incidental   CARDIOVERSION N/A 08/01/2016   Procedure: CARDIOVERSION;  Surgeon: Jerline Pain, MD;  Location: [facility] ENDOSCOPY;   Service: Cardiovascular;  Laterality: N/A;   CARDIOVERSION N/A 06/24/2019   Procedure: CARDIOVERSION;  Surgeon: Sanda Klein, MD;  Location: [place];  Service: Cardiovascular;  Laterality: N/A;   CARDIOVERSION N/A 05/30/2020   Procedure: CARDIOVERSION;  Surgeon: Sanda Klein, MD;  Location: [place] ENDOSCOPY;  Service: Cardiovascular;  Laterality: N/A;   CARDIOVERSION N/A 05/09/2020   Procedure: CARDIOVERSION;  Surgeon: [name], MD;  Location: [place];  Service: Cardiovascular;  Laterality: N/A;   CATARACT EXTRACTION W/ INTRAOCULAR LENS  IMPLANT, BILATERAL Bilateral ~ 2008-2017   left - right   COMBINED HYSTERECTOMY VAGINAL / OOPHORECTOMY / A&P [place]  09/2008   TEE WITHOUT CARDIOVERSION N/A 08/01/2016   Procedure: TRANSESOPHAGEAL ECHOCARDIOGRAM (TEE);  Surgeon: Jerline Pain, MD;  Location: [facility] ENDOSCOPY;  Service: Cardiovascular;  Laterality: N/A;   [place]   "w/1 ovary"    Family History  Problem Relation Age of Onset   Heart failure Mother    Hypertension Mother    Hypertension Father    Emphysema Father  Social History   Socioeconomic History   Marital status: Divorced    Spouse name: Not on file   Number of children: Not on file   Years of education: Not on file   Highest education level: Not on file  Occupational History   Not on file  Tobacco Use   Smoking status: Former   Smokeless tobacco: Never   Tobacco comments:    07/30/2016 "social smoker only; in college"  Vaping Use   Vaping Use: Never used  Substance and Sexual Activity   Alcohol use: Yes    Alcohol/week: 2.0 standard drinks of alcohol    Types: 1 Glasses of wine, 1 Standard drinks or equivalent per week    Comment: 07/30/2016 "couple drinks/month"   Drug use: No   Sexual activity: Not Currently  Other  Topics Concern   Not on file  Social History Narrative   Not on file   Social Determinants of Health   Financial Resource Strain: Not on file  Food Insecurity: No Food Insecurity (02/22/2022)   Hunger Vital Sign    Worried About Running Out of Food in the Last Year: Never true    Ran Out of Food in the Last Year: Never true  Transportation Needs: No Transportation Needs (02/22/2022)   PRAPARE - Hydrologist (Medical): No    Lack of Transportation (Non-Medical): No  Physical Activity: Not on file  Stress: Not on file  Social Connections: Not on file  Intimate Partner Violence: Not on file     Physical Exam   Vitals:   04/09/22 0345 04/09/22 0400  BP: (!) 140/107 (!) 139/100  Pulse: 85 81  Resp: (!) 24 (!) 21  Temp:    SpO2: 95% 94%    CONSTITUTIONAL: Well-appearing, mild respiratory distress NEURO/PSYCH:  Alert and oriented x 3, no focal deficits EYES:  eyes equal and reactive ENT/NECK:  no LAD, no JVD CARDIO: Regular rate, well-perfused, normal S1 and S2 PULM: Tachypneic, scattered rhonchi GI/GU:  non-distended, non-tender MSK/SPINE:  No gross deformities, no edema SKIN:  no rash, atraumatic   *Additional and/or pertinent findings included in MDM below  Diagnostic and Interventional Summary    EKG Interpretation  Date/Time:  Tuesday April 09 2022 01:08:45 EST Ventricular Rate:  113 PR Interval:  144 QRS Duration: 104 QT Interval:  346 QTC Calculation: 474 R Axis:   -25 Text Interpretation: Sinus tachycardia Septal infarct , age undetermined Abnormal ECG When compared with ECG of 07-Dec-2020 11:22, PREVIOUS ECG IS PRESENT Confirmed by Gerlene Fee [phone]) on 04/09/2022 2:06:23 AM       Labs Reviewed  CBC - Abnormal; Notable for the following components:      Result Value   WBC 21.9 (*)    Hemoglobin 11.8 (*)    MCH 24.6 (*)    MCHC 29.7 (*)    RDW 15.6 (*)    All other components within normal limits  COMPREHENSIVE  METABOLIC PANEL - Abnormal; Notable for the following components:   CO2 21 (*)    Glucose, Bld 163 (*)    Calcium 8.4 (*)    Albumin 3.4 (*)    All other components within normal limits  BRAIN NATRIURETIC PEPTIDE - Abnormal; Notable for the following components:   B Natriuretic Peptide 702.6 (*)    All other components within normal limits  TROPONIN I (HIGH SENSITIVITY) - Abnormal; Notable for the following components:   Troponin I (High Sensitivity) 117 (*)  All other components within normal limits  TROPONIN I (HIGH SENSITIVITY)    DG Chest 2 View  Final Result      Medications  furosemide (LASIX) injection 20 mg (20 mg Intravenous Given 04/09/22 0253)  ondansetron (ZOFRAN) injection 4 mg (4 mg Intravenous Given 04/09/22 0331)     Procedures  /  Critical Care .Critical Care  Performed by: Maudie Flakes, MD Authorized by: Maudie Flakes, MD   Critical care provider statement:    Critical care time (minutes):  45   Critical care was necessary to treat or prevent imminent or life-threatening deterioration of the following conditions:  Respiratory failure   Critical care was time spent personally by me on the following activities:  Development of treatment plan with patient or surrogate, discussions with consultants, evaluation of patient's response to treatment, examination of patient, ordering and review of laboratory studies, ordering and review of radiographic studies, ordering and performing treatments and interventions, pulse oximetry, re-evaluation of patient's condition and review of old charts   ED Course and Medical Decision Making  Initial Impression and Ddx CHF exacerbation is preferred, doubt PE, doubt pneumonia, no COPD history.  She has mild to moderate respiratory distress at this time, providing supplemental oxygen, will provide Lasix, patient may need BiPAP.  Past medical/surgical history that increases complexity of ED encounter: CHF  Interpretation of  Diagnostics I personally reviewed the EKG and my interpretation is as follows: Sinus tachycardia  Labs reveal leukocytosis to 21, troponin of 117, BNP greater than 700.  Chest x-ray revealing edema.  Patient Reassessment and Ultimate Disposition/Management     Patient did not tolerate BiPAP due to claustrophobia, she seems to be slowly improving after Lasix and supplemental oxygen.  Will request stepdown hospitalist admission.  Patient management required discussion with the following services or consulting groups:  Hospitalist Service  Complexity of Problems Addressed Acute illness or injury that poses threat of life of bodily function  Additional Data Reviewed and Analyzed Further history obtained from: EMS on arrival  Additional Factors Impacting ED Encounter Risk Consideration of hospitalization  Barth Kirks. [place] Small, [email or web address]  Final Clinical Impressions(s) / ED Diagnoses     ICD-10-CM   1. Acute on chronic congestive heart failure, unspecified heart failure type ([place])  I50.9       ED Discharge Orders     None        Discharge Instructions Discussed with and Provided to Patient:   Discharge Instructions   None      Maudie Flakes, MD 04/09/22 [phone]

## 2022-04-09 NOTE — ED Notes (Signed)
Pt taken off of NRB and placed on 4 L [place]

## 2022-04-09 NOTE — ED Triage Notes (Signed)
Pt bib [facility] from home pt was c/o sob and fluid gain over the past few days. Has a hx of chf and afib. She has been compliant with her medication. Hr ranging from 90-120. Pt was at 67% on room air.  Vitals 230/110 170/120 with nitro x 3

## 2022-04-10 ENCOUNTER — Encounter: Payer: Self-pay | Admitting: Physician Assistant

## 2022-04-10 ENCOUNTER — Other Ambulatory Visit (HOSPITAL_COMMUNITY): Payer: Self-pay

## 2022-04-10 DIAGNOSIS — I480 Paroxysmal atrial fibrillation: Secondary | ICD-10-CM | POA: Diagnosis not present

## 2022-04-10 DIAGNOSIS — N183 Chronic kidney disease, stage 3 unspecified: Secondary | ICD-10-CM | POA: Diagnosis not present

## 2022-04-10 DIAGNOSIS — R7989 Other specified abnormal findings of blood chemistry: Secondary | ICD-10-CM | POA: Diagnosis not present

## 2022-04-10 DIAGNOSIS — I5021 Acute systolic (congestive) heart failure: Secondary | ICD-10-CM

## 2022-04-10 DIAGNOSIS — I10 Essential (primary) hypertension: Secondary | ICD-10-CM

## 2022-04-10 DIAGNOSIS — E039 Hypothyroidism, unspecified: Secondary | ICD-10-CM

## 2022-04-10 DIAGNOSIS — I5043 Acute on chronic combined systolic (congestive) and diastolic (congestive) heart failure: Secondary | ICD-10-CM | POA: Diagnosis not present

## 2022-04-10 DIAGNOSIS — I214 Non-ST elevation (NSTEMI) myocardial infarction: Secondary | ICD-10-CM

## 2022-04-10 LAB — BASIC METABOLIC PANEL
Anion gap: 13 (ref 5–15)
BUN: 18 mg/dL (ref 8–23)
CO2: 26 mmol/L (ref 22–32)
Calcium: 8.4 mg/dL — ABNORMAL LOW (ref 8.9–10.3)
Chloride: 100 mmol/L (ref 98–111)
Creatinine, Ser: 1.05 mg/dL — ABNORMAL HIGH (ref 0.44–1.00)
GFR, Estimated: 55 mL/min — ABNORMAL LOW (ref >60)
Glucose, Bld: 123 mg/dL — ABNORMAL HIGH (ref 70–99)
Potassium: 3.9 mmol/L (ref 3.5–5.1)
Sodium: 139 mmol/L (ref 135–145)

## 2022-04-10 LAB — CBC
HCT: 36.3 % (ref 36.0–46.0)
Hemoglobin: 11.4 g/dL — ABNORMAL LOW (ref 12.0–15.0)
MCH: 25.1 pg — ABNORMAL LOW (ref 26.0–34.0)
MCHC: 31.4 g/dL (ref 30.0–36.0)
MCV: 79.8 fL — ABNORMAL LOW (ref 80.0–100.0)
Platelets: 323 10*3/uL (ref 150–400)
RBC: 4.55 MIL/uL (ref 3.87–5.11)
RDW: 15.7 % — ABNORMAL HIGH (ref 11.5–15.5)
WBC: 13.9 10*3/uL — ABNORMAL HIGH (ref 4.0–10.5)
nRBC: 0 % (ref 0.0–0.2)

## 2022-04-10 MED ORDER — SODIUM CHLORIDE 0.9 % IV SOLN: INTRAVENOUS | Status: DC

## 2022-04-10 MED ORDER — LOSARTAN POTASSIUM 25 MG PO TABS
25.0000 mg | ORAL_TABLET | Freq: Every day | ORAL | Status: DC
  Administered 2022-04-10: 25 mg via ORAL
  Filled 2022-04-10: qty 1

## 2022-04-10 MED ORDER — ASPIRIN 81 MG PO CHEW
81.0000 mg | CHEWABLE_TABLET | ORAL | Status: AC
  Administered 2022-04-11: 81 mg via ORAL
  Filled 2022-04-10: qty 1

## 2022-04-10 MED ORDER — HEPARIN (PORCINE) 25000 UT/250ML-% IV SOLN
1200.0000 [IU]/h | INTRAVENOUS | Status: DC
  Administered 2022-04-10: 1050 [IU]/h via INTRAVENOUS
  Filled 2022-04-10: qty 250

## 2022-04-10 MED ORDER — SODIUM CHLORIDE 0.9% FLUSH: 3.0000 mL | INTRAVENOUS | Status: DC | PRN

## 2022-04-10 MED ORDER — SODIUM CHLORIDE 0.9% FLUSH
3.0000 mL | Freq: Two times a day (BID) | INTRAVENOUS | Status: DC
  Administered 2022-04-10 – 2022-04-11 (×3): 3 mL via INTRAVENOUS

## 2022-04-10 MED ORDER — ATORVASTATIN CALCIUM 40 MG PO TABS
40.0000 mg | ORAL_TABLET | Freq: Every day | ORAL | Status: DC
  Administered 2022-04-11 – 2022-04-12 (×2): 40 mg via ORAL
  Filled 2022-04-10 (×2): qty 1

## 2022-04-10 MED ORDER — SODIUM CHLORIDE 0.9 % IV SOLN: 250.0000 mL | INTRAVENOUS | Status: DC | PRN

## 2022-04-10 MED ORDER — SODIUM CHLORIDE 0.9 % IV BOLUS
500.0000 mL | Freq: Once | INTRAVENOUS | Status: AC
  Administered 2022-04-10: 500 mL via INTRAVENOUS

## 2022-04-10 MED ORDER — AMIODARONE HCL 200 MG PO TABS
200.0000 mg | ORAL_TABLET | Freq: Every day | ORAL | Status: DC
  Administered 2022-04-11 – 2022-04-12 (×2): 200 mg via ORAL
  Filled 2022-04-10 (×3): qty 1

## 2022-04-10 MED ORDER — ASPIRIN 81 MG PO CHEW
81.0000 mg | CHEWABLE_TABLET | Freq: Every day | ORAL | Status: DC
  Administered 2022-04-10 – 2022-04-12 (×2): 81 mg via ORAL
  Filled 2022-04-10 (×4): qty 1

## 2022-04-10 MED ORDER — SPIRONOLACTONE 25 MG PO TABS
25.0000 mg | ORAL_TABLET | Freq: Every day | ORAL | Status: DC
  Administered 2022-04-10: 25 mg via ORAL
  Filled 2022-04-10: qty 1

## 2022-04-10 NOTE — Assessment & Plan Note (Signed)
Continue CPAP at night ?

## 2022-04-10 NOTE — Progress Notes (Signed)
Danford MD notified of soft BP. Pt asymptomatic. 545m bolus of NS ordered. BP returned to normal limits after bolus administered.   04/10/22 1408  Assess: MEWS Score  BP (!) 76/58  MAP (mmHg) 65  Pulse Rate 65  ECG Heart Rate 66  Resp 19  SpO2 94 %  Assess: MEWS Score  MEWS Temp 0  MEWS Systolic 2  MEWS Pulse 0  MEWS RR 0  MEWS LOC 0  MEWS Score 2  MEWS Score Color Yellow  Assess: if the MEWS score is Yellow or Red  Were vital signs taken at a resting state? No  Focused Assessment No change from prior assessment  Does the patient meet 2 or more of the SIRS criteria? No  Does the patient have a confirmed or suspected source of infection? No  Provider and Rapid Response Notified? No  MEWS guidelines implemented *See Row Information* No, vital signs rechecked  Assess: SIRS CRITERIA  SIRS Temperature  0  SIRS Pulse 0  SIRS Respirations  0  SIRS WBC 1  SIRS Score Sum  1

## 2022-04-10 NOTE — Evaluation (Signed)
Occupational Therapy Evaluation Patient Details Name: Leslie [name] MRN: [id number] DOB: 1945-12-01 Today's Date: 04/10/2022   History of Present Illness Pt is a 76 y/o F presenting to ED on 12/26 from home with SOB and fluid gain. Admitted for acute CHF exacerbation. PMH includes CH, CKD, arthritis, GERD, HTN, HLD, and OSA.   Clinical Impression   Pt reports independence at baseline with ADLs and mobility, lives alone but has family/friends that can assist. Pt needing min-max A for ADLs, min guard for bed mobility and transfers without AD. Pt on 4L O2 upon arrival, able to walk short hallway distance on RA, SpO2 dropping to 86% on RA, increased to low 90's once placed on 2.5L O2 and returned to supine in bed. Pt presenting with impairments listed below, will follow acutely. Recommend HHOT at d/c.      Recommendations for follow up therapy are one component of a multi-disciplinary discharge planning process, led by the attending physician.  Recommendations may be updated based on patient status, additional functional criteria and insurance authorization.   Follow Up Recommendations  Home health OT     Assistance Recommended at Discharge Intermittent Supervision/Assistance  Patient can return home with the following Assistance with cooking/housework;A lot of help with bathing/dressing/bathroom;Help with stairs or ramp for entrance    Functional Status Assessment  Patient has had a recent decline in their functional status and demonstrates the ability to make significant improvements in function in a reasonable and predictable amount of time.  Equipment Recommendations  None recommended by OT    Recommendations for Other Services PT consult     Precautions / Restrictions Precautions Precautions: Fall Restrictions Weight Bearing Restrictions: No      Mobility Bed Mobility Overal bed mobility: Needs Assistance Bed Mobility: Supine to Sit, Sit to Supine     Supine to sit: Min  guard Sit to supine: Min guard        Transfers Overall transfer level: Needs assistance Equipment used: None Transfers: Sit to/from Stand Sit to Stand: Min guard                  Balance Overall balance assessment: Needs assistance Sitting-balance support: Feet supported Sitting balance-Leahy Scale: Good     Standing balance support: During functional activity Standing balance-Leahy Scale: Fair Standing balance comment: approaching good, reaches for external support (railings, countertop) with mobility                           ADL either performed or assessed with clinical judgement   ADL Overall ADL's : Needs assistance/impaired Eating/Feeding: Supervision/ safety   Grooming: Wash/dry hands;Min guard   Upper Body Bathing: Moderate assistance Upper Body Bathing Details (indicate cue type and reason): due to shoulder pain Lower Body Bathing: Maximal assistance   Upper Body Dressing : Minimal assistance   Lower Body Dressing: Maximal assistance   Toilet Transfer: Min guard;Ambulation;Regular Museum/gallery exhibitions officer and Hygiene: Supervision/safety       Functional mobility during ADLs: Min guard       Vision Baseline Vision/History: 1 Wears glasses Additional Comments: will further assess, WFL for basic ADL tasks     Perception Perception Perception Tested?: No   Praxis Praxis Praxis tested?: Not tested    Pertinent Vitals/Pain Pain Assessment Pain Assessment: Faces Pain Score: 2  Pain Location: generalized - pt attributes to arthritis Pain Descriptors / Indicators: Discomfort Pain Intervention(s): Limited activity within patient's tolerance, Monitored during  session     Hand Dominance     Extremity/Trunk Assessment Upper Extremity Assessment Upper Extremity Assessment: Generalized weakness (reports bilateral shoulder pain)   Lower Extremity Assessment Lower Extremity Assessment: Defer to PT evaluation    Cervical / Trunk Assessment Cervical / Trunk Assessment: Normal   Communication Communication Communication: No difficulties   Cognition Arousal/Alertness: Awake/alert Behavior During Therapy: WFL for tasks assessed/performed Overall Cognitive Status: Within Functional Limits for tasks assessed                                       General Comments  SpO2 86% at lowest on RA, increased to 92% on 2.5L O2 once supine in bed    Exercises     Shoulder Instructions      Home Living Family/patient expects to be discharged to:: Private residence Living Arrangements: Alone Available Help at Discharge: Family;Friend(s);Available PRN/intermittently Type of Home: House Home Access: Stairs to enter [employer] of Steps: 3   Home Layout: One level     Bathroom Shower/Tub: Occupational psychologist: Handicapped height     Home Equipment: Shower seat - built in          Prior Functioning/Environment Prior Level of Function : Independent/Modified Independent;Driving             Mobility Comments: no AD use ADLs Comments: has housekeeper for cleaning        OT Problem List: Decreased strength;Decreased range of motion;Decreased activity tolerance;Impaired balance (sitting and/or standing)      OT Treatment/Interventions: Self-care/ADL training;Therapeutic exercise;Energy conservation;DME and/or AE instruction;Therapeutic activities;Balance training;Patient/family education    OT Goals(Current goals can be found in the care plan section) Acute Rehab OT Goals Patient Stated Goal: none stated OT Goal Formulation: With patient Time For Goal Achievement: 04/24/22 Potential to Achieve Goals: Good ADL Goals Pt Will Perform Lower Body Dressing: with supervision;sitting/lateral leans;sit to/from stand Pt Will Perform Tub/Shower Transfer: with supervision;ambulating;shower seat Additional ADL Goal #1: pt will verbalize 3 energy conservation  strategies in prep for ADLs  OT Frequency: Min 2X/week    Co-evaluation              AM-PAC OT "6 Clicks" Daily Activity     Outcome Measure Help from another person eating meals?: None Help from another person taking care of personal grooming?: A Little Help from another person toileting, which includes using toliet, bedpan, or urinal?: A Little Help from another person bathing (including washing, rinsing, drying)?: A Lot Help from another person to put on and taking off regular upper body clothing?: A Little Help from another person to put on and taking off regular lower body clothing?: A Lot 6 Click Score: 17   End of Session Equipment Utilized During Treatment: Oxygen Nurse Communication: Mobility status  Activity Tolerance: Patient tolerated treatment well Patient left: in bed;with call bell/phone within reach;with bed alarm set  OT Visit Diagnosis: Unsteadiness on feet (R26.81);Other abnormalities of gait and mobility (R26.89);Muscle weakness (generalized) (M62.81)                Time: 9629-5284 OT Time Calculation (min): 34 min Charges:  OT General Charges $OT Visit: 1 Visit OT Evaluation $OT Eval Moderate Complexity: 1 Mod OT Treatments $Self Care/Home Management : 8-22 mins  Renaye Rakers, OTD, OTR/L [employer] [facility]erred Acute Rehab [phone]  Renaye Rakers [place] 04/10/2022, 9:57 AM

## 2022-04-10 NOTE — Assessment & Plan Note (Signed)
Continue levothyroxine 

## 2022-04-10 NOTE — Assessment & Plan Note (Signed)
BMI 43 

## 2022-04-10 NOTE — Progress Notes (Signed)
Physical Therapy Treatment and Discharge Patient Details Name: Leslie [name] MRN: [id number] DOB: 1945/11/08 Today's Date: 04/10/2022   History of Present Illness Pt is a 76 y/o F presenting to ED on 12/26 from home with SOB and fluid gain. Admitted for acute CHF exacerbation. PMH includes CH, CKD, arthritis, GERD, HTN, HLD, and OSA.    PT Comments    Patient evaluated by Physical Therapy with no further acute PT needs identified. Pt presents with decreased cardiopulmonary endurance in comparison to her baseline. Pt ambulating household distances with no assistive device modI. Education provided regarding daily weights, activity recommendations and progression. All education has been completed and the patient has no further questions. No follow-up Physical Therapy or equipment needs. PT is signing off. Thank you for this referral.  SATURATION QUALIFICATIONS: (This note is used to comply with regulatory documentation for home oxygen)  Patient Saturations on Room Air at Rest = 91%  Patient Saturations on Room Air while Ambulating = 86%  Patient Saturations on 2 Liters of oxygen while Ambulating = 90%  Please briefly explain why patient needs home oxygen: To maintain oxygen saturations >/= 90% during ambulation   Recommendations for follow up therapy are one component of a multi-disciplinary discharge planning process, led by the attending physician.  Recommendations may be updated based on patient status, additional functional criteria and insurance authorization.  Follow Up Recommendations  No PT follow up     Assistance Recommended at Discharge PRN  Patient can return home with the following Assist for transportation;Help with stairs or ramp for entrance   Equipment Recommendations  None recommended by PT    Recommendations for Other Services       Precautions / Restrictions Precautions Precautions: Fall Restrictions Weight Bearing Restrictions: No     Mobility  Bed  Mobility Overal bed mobility: Modified Independent                  Transfers Overall transfer level: Modified independent Equipment used: None               General transfer comment: Use of momentum to power up    Ambulation/Gait Ambulation/Gait assistance: Modified independent (Device/Increase time) Gait Distance (Feet): 150 Feet Assistive device: None Gait Pattern/deviations: Step-through pattern, Decreased stride length, Wide base of support       General Gait Details: Slow and steady pace; one brief standing rest break   Stairs             Wheelchair Mobility    Modified Rankin (Stroke Patients Only)       Balance Overall balance assessment: Needs assistance Sitting-balance support: Feet supported Sitting balance-Leahy Scale: Good     Standing balance support: During functional activity Standing balance-Leahy Scale: Good                              Cognition Arousal/Alertness: Awake/alert Behavior During Therapy: WFL for tasks assessed/performed Overall Cognitive Status: Within Functional Limits for tasks assessed                                          Exercises      General Comments General comments (skin integrity, edema, etc.): SpO2 86% at lowest on RA, increased to 92% on 2.5L O2 once supine in bed      Pertinent Vitals/Pain Pain Assessment Pain  Assessment: Faces Faces Pain Scale: Hurts a little bit Pain Location: generalized - pt attributes to arthritis Pain Descriptors / Indicators: Discomfort Pain Intervention(s): Monitored during session    Home Living Family/patient expects to be discharged to:: Private residence Living Arrangements: Alone Available Help at Discharge: Family;Friend(s);Available PRN/intermittently Type of Home: House Home Access: Stairs to enter   Entrance Stairs-Number of Steps: 3   Home Layout: One level Home Equipment: Shower seat - built in      Prior  Function            PT Goals (current goals can now be found in the care plan section) Acute Rehab PT Goals Patient Stated Goal: to improve breathing PT Goal Formulation: All assessment and education complete, DC therapy    Frequency           PT Plan      Co-evaluation              AM-PAC PT "6 Clicks" Mobility   Outcome Measure  Help needed turning from your back to your side while in a flat bed without using bedrails?: None Help needed moving from lying on your back to sitting on the side of a flat bed without using bedrails?: None Help needed moving to and from a bed to a chair (including a wheelchair)?: None Help needed standing up from a chair using your arms (e.g., wheelchair or bedside chair)?: None Help needed to walk in hospital room?: None Help needed climbing 3-5 steps with a railing? : A Little 6 Click Score: 23    End of Session Equipment Utilized During Treatment: Oxygen Activity Tolerance: Patient tolerated treatment well Patient left: in bed;with call bell/phone within reach Nurse Communication: Mobility status PT Visit Diagnosis: Difficulty in walking, not elsewhere classified (R26.2)     Time: 1610-9604 PT Time Calculation (min) (ACUTE ONLY): 22 min  Charges:                        Wyona Almas, PT, DPT Acute Rehabilitation Services Office [phone]    Deno Etienne 04/10/2022, 10:18 AM

## 2022-04-10 NOTE — Assessment & Plan Note (Signed)
Baseline 1.1.

## 2022-04-10 NOTE — Assessment & Plan Note (Signed)
-  Continue Lipitor °

## 2022-04-10 NOTE — Progress Notes (Signed)
  Progress Note   Patient: Leslie [name] ESP:[id number] DOB: [birth year] DOA: 04/09/2022     1 DOS: the patient was seen and examined on 04/10/2022 at 8:18AM      Brief hospital course: [name] is a 76 y.o. F with persAF on flecainide and Eliquis, dCHF, CKD IIIa baseline 1.1, HTN, and MO who presented with several weeks progression of shortness of breath.     12/26: Admitted on Lasix, Cardiology consutled, Echo shows new reduced EF 12/27: Hypotensives with addition of GDMT     Assessment and Plan: * Acute on chronic combined systolic and diastolic CHF (congestive heart failure) ([place]) EF newly down to 25-30%. Net negative 1.4L, weight down 1.3kg.   Cr  stable, K normal - Lasix as per below - Strict I/Os, daily weights, daily BMP - Stop CCB - Consult Cafrdiology, apprecaite cares - Plan for [facility] tomorrow    Elevated troponin Unclear if this is myocardial injury from CHF or NSTEMI - heparin gtt for now - LHC pending - Continue aspirin, Lipitor - Hold flecainide until LHC  OSA (obstructive sleep apnea) - Continue CPAP at night  Persistent atrial fibrillation (HCC) - Continue heparin gtt - Hold home Eliquis - Continue amiodarone, metoprolol - Hold flecainide, verapamil  Chronic kidney disease, stage IIIa Baseline 1.1.  Hypothyroidism - Continue levothyroxine  Morbid obesity (HCC) BMI 43  Pure hypercholesterolemia - Continue Lipitor  Essential hypertension, benign Hypotension BP dropped today after addition of GDMT.  Asymptomatic, HR normal, WBC up but no localizing symptoms, no concern for infection at this time. - Hold spironolactone and losartan - Hold Lasix and afternoon metoprolol for now - Trend BP - Give gentle fluids          Subjective: No complaints.  Tired, but no SOB, no confusion, no chest pain, no respiratory distress, no swelling.     Physical Exam: BP (!) 76/58   Pulse 65   Temp 97.8 F (36.6 C) (Oral)   Resp 19    Ht '5\' 6"'$  (1.676 m)   Wt 120.9 kg   SpO2 94%   BMI 43.02 kg/m   Obese adult female, lying in bed, no acute distress, responsive to questions. RRR, no murmurs, no significant pitting peripheral edema Respiratory rate normal, lungs very diminished due to body habitus clear without rales or wheezes Abdomen soft without tenderness palpation or guarding, no ascites or distention, exam limited by habitus Attentive to my questions, oriented times person, place, and situation, face symmetric, speech fluent, moves upper extremities with generalized weakness but symmetric strength    Data Reviewed: Discussed with cardiology Basic metabolic panel shows stable renal function, potassium Complete blood count shows improving leukocytosis Echocardiogram shows reduced EF   Family Communication: None present    Disposition: Status is: Inpatient The patient has new onset reduced EF and congestive heart failure.  She will need ischemia workup which is pending        Author: Edwin Dada, MD 04/10/2022 2:46 PM  For on call review [email or web address].

## 2022-04-10 NOTE — Hospital Course (Signed)
Leslie [name] is a 76 y.o. F with persAF on flecainide and Eliquis, dCHF, CKD IIIa baseline 1.1, HTN, and MO who presented with several weeks progression of shortness of breath.     12/26: Admitted on Lasix, Cardiology consutled, Echo shows new reduced EF 12/27: Hypotensives with addition of GDMT

## 2022-04-10 NOTE — Progress Notes (Addendum)
Rounding Note    Patient Name: Leslie [name] Date of Encounter: 04/10/2022  [place] Cardiologist: Sanda Klein, MD   Subjective   Patient reports feeling better this AM, but continues to have some orthopnea, dyspnea on exertion. Denies palpitations or chest pain this AM. Does admit to having occasional chest discomfort at home, usually when her blood pressure is elevated.   Inpatient Medications    Scheduled Meds:  apixaban  5 mg Oral BID   atorvastatin  10 mg Oral Daily   budesonide (PULMICORT) nebulizer solution  0.25 mg Nebulization BID   docusate sodium  100 mg Oral BID   flecainide  50 mg Oral BID   fluticasone  2 spray Each Nare QHS   furosemide  60 mg Intravenous BID   levothyroxine  50 mcg Oral QAC breakfast   loratadine  10 mg Oral Daily   magnesium oxide  400 mg Oral Daily   metoprolol succinate  100 mg Oral q AM   metoprolol succinate  50 mg Oral QPM   pantoprazole  40 mg Oral Daily   sodium chloride flush  3 mL Intravenous Q12H   verapamil  180 mg Oral Daily   Continuous Infusions:  PRN Meds: acetaminophen, bisacodyl, hydrALAZINE, ondansetron **OR** ondansetron (ZOFRAN) IV, oxyCODONE, polyethylene glycol, traZODone   Vital Signs    Vitals:   04/10/22 0735 04/10/22 0830 04/10/22 0850 04/10/22 0854  BP: 123/84     Pulse: 86     Resp: 16     Temp: 97.9 F (36.6 C)     TempSrc: Oral     SpO2: 93% 94% (!) 86% 94%  Weight:      Height:        Intake/Output Summary (Last 24 hours) at 04/10/2022 0904 Last data filed at 04/10/2022 0800 Gross per 24 hour  Intake 420 ml  Output 1600 ml  Net -1180 ml      04/10/2022    1:13 AM 04/10/2022   12:54 AM 04/09/2022    2:48 PM  Last 3 Weights  Weight (lbs) 266 lb 8.6 oz 266 lb 8.6 oz 269 lb 6.4 oz  Weight (kg) 120.9 kg 120.9 kg 122.2 kg      Telemetry    Sinus rhythm, episodes of sinus tachycardia - Personally Reviewed  ECG     No new tracings- Personally Reviewed  Physical  Exam   GEN: No acute distress.  Laying in bed with  head elevated. Wearing [place]  Neck: No JVD Cardiac: RRR, no murmurs, rubs, or gallops.  Respiratory: Crackles in bilateral lower lungs. Normal WOB on 2.5 L supplemental O2  GI: Soft, nontender, non-distended  MS: Trace edema in BLE; No deformity. Neuro:  Nonfocal  Psych: Normal affect   Labs    High Sensitivity Troponin:   Recent Labs  Lab 04/09/22 0255 04/09/22 0430 04/09/22 1453 04/09/22 1653  TROPONINIHS 117* 292* 876* 716*     Chemistry Recent Labs  Lab 04/09/22 0255 04/10/22 0231  NA 138 139  K 4.1 3.9  CL 106 100  CO2 21* 26  GLUCOSE 163* 123*  BUN 20 18  CREATININE 0.96 1.05*  CALCIUM 8.4* 8.4*  PROT 6.8  --   ALBUMIN 3.4*  --   AST 20  --   ALT 14  --   ALKPHOS 99  --   BILITOT 0.9  --   GFRNONAA >60 55*  ANIONGAP 11 13    Lipids No results for input(s): "CHOL", "TRIG", "  HDL", "LABVLDL", "[place]", "CHOLHDL" in the last 168 hours.  Hematology Recent Labs  Lab 04/09/22 0255 04/10/22 0231  WBC 21.9* 13.9*  RBC 4.80 4.55  HGB 11.8* 11.4*  HCT 39.7 36.3  MCV 82.7 79.8*  MCH 24.6* 25.1*  MCHC 29.7* 31.4  RDW 15.6* 15.7*  PLT 372 323   Thyroid No results for input(s): "TSH", "FREET4" in the last 168 hours.  BNP Recent Labs  Lab 04/09/22 0255  BNP 702.6*    DDimer No results for input(s): "DDIMER" in the last 168 hours.   Radiology    ECHOCARDIOGRAM COMPLETE  Result Date: 04/09/2022    ECHOCARDIOGRAM REPORT   Patient Name:   Leslie [name] Date of Exam: 04/09/2022 Medical Rec #:  [id number]       Height:       65.5 in Accession #:    [id number]      Weight:       273.4 lb Date of Birth:  [birth year]       BSA:          2.272 m Patient Age:    3 years        BP:           128/89 mmHg Patient Gender: F               HR:           90 bpm. Exam Location:  Inpatient Procedure: 2D Echo, Cardiac Doppler and Color Doppler Indications:    CHF - acute systolic  History:        Patient has prior history  of Echocardiogram examinations, most                 recent 06/01/2020. Arrythmias:Atrial Fibrillation,                 Signs/Symptoms:Shortness of Breath, Edema and Fatigue; Risk                 Factors:Hypertension, Dyslipidemia, Diabetes and Sleep Apnea.                 CKD.  Sonographer:    Eartha Inch Referring Phys: Karmen Bongo  Sonographer Comments: Patient is obese. Image acquisition challenging due to patient body habitus and Image acquisition challenging due to respiratory motion. IMPRESSIONS  1. Left ventricular ejection fraction, by estimation, is 25 to 30%. The left ventricle has severely decreased function. The left ventricle demonstrates regional wall motion abnormalities (see scoring diagram/findings for description). There is mild left  ventricular hypertrophy. Left ventricular diastolic parameters are indeterminate.  2. Right ventricular systolic function is normal. The right ventricular size is normal. Tricuspid regurgitation signal is inadequate for assessing PA pressure.  3. A small pericardial effusion is present. The pericardial effusion is anterior to the right ventricle.  4. The mitral valve is normal in structure. Moderate mitral valve regurgitation. No evidence of mitral stenosis.  5. The aortic valve is tricuspid. There is mild calcification of the aortic valve. Aortic valve regurgitation is moderate - incompletely interrogated. No aortic stenosis is present.  6. The inferior vena cava is dilated in size with >50% respiratory variability, suggesting right atrial pressure of 8 mmHg. FINDINGS  Left Ventricle: Left ventricular ejection fraction, by estimation, is 25 to 30%. The left ventricle has severely decreased function. The left ventricle demonstrates regional wall motion abnormalities. The left ventricular internal cavity size was normal  in size. There is mild left ventricular hypertrophy. Left ventricular diastolic parameters  are indeterminate.  LV Wall Scoring: The mid and  distal anterior septum and mid inferoseptal segment are akinetic. The entire anterior wall, antero-lateral wall, mid and distal lateral wall, and entire inferior wall are hypokinetic. The basal anteroseptal segment, basal inferolateral segment, and basal inferoseptal segment are normal. Right Ventricle: The right ventricular size is normal. No increase in right ventricular wall thickness. Right ventricular systolic function is normal. Tricuspid regurgitation signal is inadequate for assessing PA pressure. Left Atrium: Left atrial size was normal in size. Right Atrium: Right atrial size was normal in size. Pericardium: A small pericardial effusion is present. The pericardial effusion is anterior to the right ventricle. Mitral Valve: The mitral valve is normal in structure. Moderate mitral valve regurgitation. No evidence of mitral valve stenosis. Tricuspid Valve: The tricuspid valve is normal in structure. Tricuspid valve regurgitation is not demonstrated. No evidence of tricuspid stenosis. Aortic Valve: The aortic valve is tricuspid. There is mild calcification of the aortic valve. Aortic valve regurgitation is moderate. Aortic regurgitation PHT measures 255 msec. No aortic stenosis is present. Pulmonic Valve: The pulmonic valve was grossly normal. Pulmonic valve regurgitation is trivial. No evidence of pulmonic stenosis. Aorta: The aortic root is normal in size and structure. Venous: The inferior vena cava is dilated in size with greater than 50% respiratory variability, suggesting right atrial pressure of 8 mmHg. IAS/Shunts: The atrial septum is grossly normal.  LEFT VENTRICLE PLAX 2D LVIDd:         5.20 cm      Diastology LVIDs:         3.70 cm      LV e' medial:    3.09 cm/s LV PW:         1.20 cm      LV E/e' medial:  40.1 LV IVS:        1.10 cm      LV e' lateral:   4.74 cm/s LVOT diam:     2.00 cm      LV E/e' lateral: 26.2 LV SV:         70 LV SV Index:   31 LVOT Area:     3.14 cm  LV Volumes (MOD) LV vol  d, MOD A2C: 94.5 ml LV vol d, MOD A4C: 148.0 ml LV vol s, MOD A2C: 58.6 ml LV vol s, MOD A4C: 80.8 ml LV SV MOD A2C:     35.9 ml LV SV MOD A4C:     148.0 ml LV SV MOD BP:      54.5 ml RIGHT VENTRICLE            IVC RV S prime:     9.46 cm/s  IVC diam: 2.20 cm TAPSE (M-mode): 2.3 cm LEFT ATRIUM             Index        RIGHT ATRIUM           Index LA diam:        4.90 cm 2.16 cm/m   RA Area:     16.50 cm LA Vol (A2C):   55.4 ml 24.38 ml/m  RA Volume:   35.30 ml  15.54 ml/m LA Vol (A4C):   69.5 ml 30.59 ml/m LA Biplane Vol: 62.8 ml 27.64 ml/m  AORTIC VALVE LVOT Vmax:   118.00 cm/s LVOT Vmean:  87.700 cm/s LVOT VTI:    0.222 m AI PHT:      255 msec  AORTA Ao Root diam: 2.70 cm Ao  Asc diam:  3.40 cm MITRAL VALVE MV Area (PHT): 4.99 cm     SHUNTS MV Decel Time: 152 msec     Systemic VTI:  0.22 m MR Peak grad: 71.2 mmHg     Systemic Diam: 2.00 cm MR Mean grad: 55.0 mmHg MR Vmax:      422.00 cm/s MR Vmean:     353.0 cm/s MV E velocity: 124.00 cm/s MV A velocity: 90.00 cm/s MV E/A ratio:  1.38 Cherlynn Kaiser MD Electronically signed by Cherlynn Kaiser MD Signature Date/Time: 04/09/2022/5:48:16 PM    Final    DG Chest 2 View  Result Date: 04/09/2022 CLINICAL DATA:  Shortness of breath. EXAM: CHEST - 2 VIEW COMPARISON:  Chest radiograph dated 05/30/2020. FINDINGS: Cardiomegaly with vascular congestion and edema. Pneumonia is not excluded clinical correlation is recommended. A small right pleural effusion may be present. No pneumothorax. No acute osseous pathology. IMPRESSION: Cardiomegaly with vascular congestion and edema. Pneumonia is not excluded. Electronically Signed   By: Anner Crete M.D.   On: 04/09/2022 02:45    Cardiac Studies   Echocardiogram 04/09/22  1. Left ventricular ejection fraction, by estimation, is 25 to 30%. The  left ventricle has severely decreased function. The left ventricle  demonstrates regional wall motion abnormalities (see scoring  diagram/findings for description).  There is mild left   ventricular hypertrophy. Left ventricular diastolic parameters are  indeterminate.   2. Right ventricular systolic function is normal. The right ventricular  size is normal. Tricuspid regurgitation signal is inadequate for assessing  PA pressure.   3. A small pericardial effusion is present. The pericardial effusion is  anterior to the right ventricle.   4. The mitral valve is normal in structure. Moderate mitral valve  regurgitation. No evidence of mitral stenosis.   5. The aortic valve is tricuspid. There is mild calcification of the  aortic valve. Aortic valve regurgitation is moderate - incompletely  interrogated. No aortic stenosis is present.   6. The inferior vena cava is dilated in size with >50% respiratory  variability, suggesting right atrial pressure of 8 mmHg.     Patient Profile     76 y.o. female with a hx of persistent afib, HFpEF, GERD, HTN, HLD, CKD stage III, mild MR who is being seen 04/09/2022 for the evaluation of elevated troponin/CHF   Assessment & Plan    Acute on Chronic HFrEF  - Patient presented complaining of progressive SOB and edema. Had acute shortness of breath the evening of admission (12/25). Noted to be significantly hypertensive on arrival with BP 230/110  - BNP elevated to 702. CXR showed cardiomegaly with vascular congestion and edema  - Echocardiogram this admission showed EF 25-30% with regional wall motion abnormalities (was previously 55-60% in 2022)  - Given newly reduced EF with regional wall motion abnormalities, suspect patient will need cardiac catheterization. Tentatively tomorrow  - On IV lasix 60 mg BID-- output 1.4 L urine overnight. Renal function stable. Continues to have crackles in lungs and requires supplemental oxygen  - Continue diuresis for now  - Continue metoprolol  - Stop verapamil given reduced EF and to further titrate GDMT   - Start losartan, spironolactone   HTN Urgency  - Patient presented  with BP elevated >259 systolic  - BP now improved  - Continue metoprolol - Stop verapamil as above  - Start losartan, spironolactone   Persistent Afib  - Patient is currently in NSR  - Continue metoprolol - Stop flecainide due to reduced EF -  Start amiodarone 200 mg daily to help maintain NSR  - Stop eliquis prior to cath. Start IV heparin   Elevated Troponin  - hsTn 117>292>876>716  - Patient did have some chest discomfort on arrival, currently chest pain free  - Echocardiogram consistent with stress cardiomyopathy. This is likely responsible for elevated troponin. HTN urgency likely contributing as well. Cath to rule out CAD as above   Valvular Disease  - Echo this admission with moderate mitral valve regurgitation and moderate aortic valve regurgitation  - Echo from 05/2020 showed mild mitral valve regurgitation and moderate aortic valve regurgitation  - Continue to follow as an outpatient   OSA  - Reports compliance with CPAP   For questions or updates, please contact [place] Please consult [email or web address] for contact info under        Signed, Margie Billet, PA-C  04/10/2022, 9:04 AM

## 2022-04-10 NOTE — Progress Notes (Signed)
Patient declines CPAP for the night

## 2022-04-10 NOTE — Plan of Care (Signed)
Pt walked in hallway with therapy. Pt weaned to 2.5L . Pt NPO at [facility] for heart cath tomorrow. Pt had low BP today; resolved with fluid bolus. Pt complaining of nausea and constipation; PRN zofran and miralax given.  Problem: Education: Goal: Ability to demonstrate management of disease process will improve Outcome: Progressing Goal: Ability to verbalize understanding of medication therapies will improve Outcome: Progressing   Problem: Activity: Goal: Capacity to carry out activities will improve Outcome: Progressing   Problem: Cardiac: Goal: Ability to achieve and maintain adequate cardiopulmonary perfusion will improve Outcome: Progressing   Problem: Health Behavior/Discharge Planning: Goal: Ability to manage health-related needs will improve Outcome: Progressing   Problem: Clinical Measurements: Goal: Ability to maintain clinical measurements within normal limits will improve Outcome: Progressing Goal: Will remain free from infection Outcome: Progressing Goal: Diagnostic test results will improve Outcome: Progressing Goal: Respiratory complications will improve Outcome: Progressing Goal: Cardiovascular complication will be avoided Outcome: Progressing   Problem: Activity: Goal: Risk for activity intolerance will decrease Outcome: Progressing   Problem: Nutrition: Goal: Adequate nutrition will be maintained Outcome: Progressing   Problem: Elimination: Goal: Will not experience complications related to urinary retention Outcome: Progressing   Problem: Elimination: Goal: Will not experience complications related to bowel motility Outcome: Not Progressing

## 2022-04-10 NOTE — Progress Notes (Signed)
ANTICOAGULATION CONSULT NOTE - Initial Consult  Pharmacy Consult for Heparin  Indication: atrial fibrillation  Allergies  Allergen Reactions   Clindamycin Hcl     Lip and face swelling   Augmentin [Amoxicillin-Pot Clavulanate] Diarrhea    Did it involve swelling of the face/tongue/throat, SOB, or low BP? No Did it involve sudden or severe rash/hives, skin peeling, or any reaction on the inside of your mouth or nose? No Did you need to seek medical attention at a hospital or doctor's office? No When did it last happen?      10 + years If all above answers are "NO", may proceed with cephalosporin use.    Codeine Nausea Only and Other (See Comments)    Hyperactivity   Demerol [Meperidine] Nausea And Vomiting   Sulfa Antibiotics Rash    Patient Measurements: Height: '5\' 6"'$  (167.6 cm) Weight: 120.9 kg (266 lb 8.6 oz) IBW/kg (Calculated) : 59.3 Heparin Dosing Weight: 88.5 kg  Vital Signs: Temp: 97.9 F (36.6 C) (12/27 0735) Temp Source: Oral (12/27 0735) BP: 123/84 (12/27 0735) Pulse Rate: 86 (12/27 0735)  Labs: Recent Labs    04/09/22 0255 04/09/22 0430 04/09/22 1453 04/09/22 1653 04/10/22 0231  HGB 11.8*  --   --   --  11.4*  HCT 39.7  --   --   --  36.3  PLT 372  --   --   --  323  CREATININE 0.96  --   --   --  1.05*  TROPONINIHS 117* 292* 876* 716*  --     Estimated Creatinine Clearance: 61.3 mL/min (A) (by C-G formula based on SCr of 1.05 mg/dL (H)).   Medical History: Past Medical History:  Diagnosis Date   Adenomatous polyp    Arthritis    "knees, legs, fingers" (07/30/2016)   Asthma    Bursitis of left shoulder    "just finished PT" (07/30/2016)   Chest pain    a. 2003 Abnl stress test-->Cath: nonobs CAD.   Chronic bronchitis (HCC)    Chronic kidney disease    stage 3   GERD (gastroesophageal reflux disease)    barrett's esophagus- Dr. Cristina Gong   History of hiatal hernia    Hyperlipidemia    Hypertension    Hypothyroidism    Migraines     "sporatic; at least a few/year" (07/30/2016)   Mitral regurgitation    a. 07/2016 Echo: mild to mod MR;  b. 07/2016 TEE: mild MR.   Moderate aortic insufficiency    a. 07/2016 Echo: EF 55-60%, mod AI;  b. 07/2016 TEE: EF 50-55%, mod AI.   Obesity    s/p lap band surgery 6/10   OSA on CPAP    PAF (paroxysmal atrial fibrillation) ([place])    a. 07/2016 TEE/DCCV: EF 50-55%, mild MR, mod AI, mild to mod TR, neg bubble study-->successful DCCV x1 w/ 120J.   PONV (postoperative nausea and vomiting)    Vitamin D deficiency     Assessment: 76 yo female with HFrEF (EF 25-30%) admitted with worsening SOB and edema. Eliquis 5 mg BID PTA for afib, last dose of Eliquis 12/27 at 0800. Cardiac cath tentatively planned for 12/28. Pharmacy consulted to dose heparin. Utilizing aPTT while Eliquis is affecting heparin levels.   Goal of Therapy:  Heparin level 0.3-0.7 units/ml aPTT 66-102 seconds Monitor platelets by anticoagulation protocol: Yes   Plan:  Will not bolus in setting of recent DOAC intake.  Start heparin infusion at 1050 units/hr 12/27 at [place] anti-Xa/aPTT  level in 6 hours and daily while on heparin, expect anti-Xa to be elevated in the setting of recent DOAC intake.  Continue to monitor H&H and platelets  Francena Hanly, PharmD Pharmacy Resident  04/10/2022 9:56 AM

## 2022-04-10 NOTE — Assessment & Plan Note (Signed)
-  Continue aspirin, Lipitor 

## 2022-04-10 NOTE — Assessment & Plan Note (Addendum)
Unclear if this is myocardial injury from CHF or NSTEMI - heparin gtt for now - LHC pending - Continue aspirin, Lipitor - Hold flecainide until [facility]

## 2022-04-10 NOTE — Progress Notes (Signed)
Initial Nutrition Assessment  DOCUMENTATION CODES:   Morbid obesity  INTERVENTION:  Encouraged intake of small, frequent meals. Discussed choosing a good source of protein at meals.  Pt denies need for oral nutrition supplement at this time and reports her appetite is returning to normal.  Provide multivitamin with minerals daily.  NUTRITION DIAGNOSIS:   Increased nutrient needs related to catabolic illness (CHF) as evidenced by estimated needs.  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  Met with patient at bedside. She reports her appetite has been decreased due to absence of hunger and nausea since admitted. Per chart she ate 25% of breakfast. Pt reports her nausea is improving now after receiving Zofran and she feels her appetite is returning to normal. Pt reports typically eating 2 meals per day at baseline and that she has a good appetite. She typically skips breakfast or only has coffee. For lunch she has meat/protein with sides. For dinner she has a salad or sandwich. She follows low-sodium diet and doesn't add salt to meals. Denies abdominal pain or difficulties with chewing/swallowing. Denies food allergies or intolerances. She reports for the past 3 years she will have constipation for a while and then diarrhea. Last BM 12/25. Pt reports her PCP monitors vitamin labs closely due to history of lap gastric banding. She takes a multivitamin daily and vitamin D 1000 units daily. She also takes a magnesium supplement. Her calcium supplement was stopped by PCP. Discussed importance of adequate intake to prevent loss of  lean body mass. Encouraged intake of small, frequent meals in setting of nausea. Patient reports she does not need any oral nutrition supplements and feels she will be able to eat well at meals. She also denies any nutrition education needs at this time. Recommend resuming multivitamin while admitted.  Pt reports her UBW/dry wt is 265 lbs and denies any unintentional wt loss. Noted weights PTA fluctuate between 121-124 kg. Current wt is 120.9 kg (266.54 lbs).  UOP: 1600 mL (0.6 mL/kg/hr)  I/O: -1420 mL since admission  Medications reviewed and include: Colace 100 mg BID, Lasix 60 mg BID, levothyroxine, magnesium oxide 400 mg daily, pantoprazole, spironolactone, heparin  Labs reviewed: Creatinine 1.05  Pt does not meet criteria for malnutrition at this time.  Discussed with RN.  NUTRITION - FOCUSED PHYSICAL EXAM:  Flowsheet Row Most Recent Value  Orbital Region No depletion  Upper Arm Region No depletion  Thoracic and Lumbar Region No depletion  Buccal Region No depletion  Temple Region No depletion  Clavicle Bone Region No depletion  Clavicle and Acromion Bone Region No depletion  Scapular Bone Region No depletion  Dorsal Hand No depletion  Patellar Region No depletion  Anterior Thigh Region No depletion  Posterior Calf Region No depletion  Edema (RD Assessment) Mild  Hair Reviewed  Eyes Reviewed  Mouth Reviewed  Skin Reviewed  Nails Reviewed       Diet Order:   Diet Order             Diet Heart Room service appropriate? Yes; Fluid consistency: Thin; Fluid restriction: 1500 mL Fluid  Diet effective now                  EDUCATION NEEDS:   Education needs have been addressed (Encouraged intake of small, frequent meals and discussed importance of adequate protein intake. Pt denies any further nutrition education needs at this time.)  Skin:  Skin Assessment: Reviewed RN Assessment  Last BM:  04/08/22 per pt report  Height:   Ht Readings from Last 1  Encounters:  04/09/22 _0  (1.676 m)   Weight:   Wt Readings from Last 1 Encounters:  04/10/22 120.9 kg   Ideal Body Weight:  59.1 kg  BMI:  Body mass index is 43.02 kg/m.  Estimated Nutritional Needs:   Kcal:  2100-2300  Protein:  105-120 grams  Fluid:  1.5 L/day per current fluid restriction order  Loanne Drilling, MS, RD, LDN, CNSC Pager number available on Amion

## 2022-04-10 NOTE — Assessment & Plan Note (Signed)
-   Continue heparin gtt - Hold home Eliquis - Continue amiodarone, metoprolol - Hold flecainide, verapamil

## 2022-04-10 NOTE — Progress Notes (Addendum)
   Heart Failure Stewardship Pharmacist Progress Note   PCP: Glenis Smoker, MD PCP-Cardiologist: Sanda Klein, MD    HPI:  76 yo F with PMH of CHF, CKD III, afib, HTN, and HLD.   Presented to the ED on 12/26 with shortness of breath, orthopnea, and LE edema. CXR with cardiomegaly, vascular congestion, and edema. ECHO 12/26 showed LVEF 25-30% (was 55-60% in 05/2020), regional wall motion abnormalities, mild LVH, RV normal, moderate MR, and moderate AR. Scheduled for [facility] on 12/28.  Current HF Medications: Diuretic: furosemide 60 mg IV BID Beta Blocker: metoprolol XL 100 mg AM 50 mg PM ACE/ARB/ARNI: losartan 25 mg daily MRA: spironolactone 25 mg daily  Prior to admission HF Medications: Diuretic: furosemide 20 mg daily Beta blocker: metoprolol XL 100 mg AM 50 mg PM *also taking verapamil 180 mg daily  Pertinent Lab Values: Serum creatinine 1.05, BUN 18, Potassium 3.9, Sodium 139, BNP 702.6   Vital Signs: Weight: 266 lbs (admission weight: 269 lbs) Blood pressure: 120/80s  Heart rate: 60-80s  I/O: -0.5L yesterday; net -1.2L  Medication Assistance / Insurance Benefits Check: Does the patient have prescription insurance?  Yes Type of insurance plan: [employer]  Does the patient qualify for medication assistance through manufacturers or grants?   Pending Eligible grants and/or patient assistance programs: pending Medication assistance applications in progress: none  Medication assistance applications approved: none Approved medication assistance renewals will be completed by: pending  Outpatient Pharmacy:  Prior to admission outpatient pharmacy: [facility] Is the patient willing to use [facility] at discharge? Yes Is the patient willing to transition their outpatient pharmacy to utilize a [facility] outpatient pharmacy?   Pending    Assessment: 1. Acute on chronic systolic and diastolic CHF (LVEF 46-96%), pending ischemic evaluation. NYHA  class III symptoms. - Continue furosemide 60 mg IV BID. Strict I/Os and daily weights. Keep K>4. Check magnesium in AM. - Continue metoprolol XL 100 mg AM and 50 mg PM - Agree with starting losartan 25 mg daily and spironolactone 25 mg daily today - Consider starting SGLT2i after cath tomorrow - Agree with stopping verapamil and flecainide, started on amiodarone to maintain NSR   Plan: 1) Medication changes recommended at this time: - Agree with changes - Start Farxiga 10 mg daily after cath - Stop verapamil and flecainide at discharge  2) Patient assistance: Wilder Glade copay $138.02 - Jardiance copay $144.86 Delene Loll copay $163.08 - She is in the donut hole and this is why copays are high right now, recommend test claims starting 1/1 when she is no longer in the donut hole. Can use free 30 day cards in the meantime.  - Can begin patient assistance if copays are too high in the new year  3)  Education  - Patient has been educated on current HF medications and potential additions to HF medication regimen - Patient verbalizes understanding that over the next few months, these medication doses may change and more medications may be added to optimize HF regimen - Patient has been educated on basic disease state pathophysiology and goals of therapy   Kerby Nora, PharmD, BCPS Heart Failure Stewardship Pharmacist Phone [phone]

## 2022-04-10 NOTE — Assessment & Plan Note (Addendum)
Hypotension BP dropped today after addition of GDMT.  Asymptomatic, HR normal, WBC up but no localizing symptoms, no concern for infection at this time. - Hold spironolactone and losartan - Hold Lasix and afternoon metoprolol for now - Trend BP - Give gentle fluids

## 2022-04-10 NOTE — Progress Notes (Signed)
Pt unable to wear CPAP at this time. O2 stat will not maintain due to old home unit set up system. Pt states process already in effect to receive an updated home unit. Placed pt back on 5LNC for OSA usage through the night. Adv pt could use facility device but pt declined. Pt stable and maintaining but will continue to monitor for any changes

## 2022-04-10 NOTE — Assessment & Plan Note (Addendum)
EF newly down to 25-30%. Net negative 1.4L, weight down 1.3kg.   Cr  stable, K normal - Lasix as per below - Strict I/Os, daily weights, daily BMP - Stop CCB - Consult Cafrdiology, apprecaite cares - Plan for [facility] tomorrow

## 2022-04-11 ENCOUNTER — Inpatient Hospital Stay (HOSPITAL_COMMUNITY)
Admission: EM | Disposition: A | Discharge status: Home / Self Care | Payer: Self-pay | Source: Home / Self Care | Attending: Family Medicine

## 2022-04-11 DIAGNOSIS — I5043 Acute on chronic combined systolic (congestive) and diastolic (congestive) heart failure: Secondary | ICD-10-CM | POA: Diagnosis not present

## 2022-04-11 DIAGNOSIS — J45909 Unspecified asthma, uncomplicated: Secondary | ICD-10-CM

## 2022-04-11 DIAGNOSIS — D6869 Other thrombophilia: Secondary | ICD-10-CM

## 2022-04-11 DIAGNOSIS — I5021 Acute systolic (congestive) heart failure: Secondary | ICD-10-CM | POA: Diagnosis not present

## 2022-04-11 DIAGNOSIS — R7989 Other specified abnormal findings of blood chemistry: Secondary | ICD-10-CM | POA: Diagnosis not present

## 2022-04-11 HISTORY — PX: RIGHT/LEFT HEART CATH AND CORONARY ANGIOGRAPHY: CATH118266

## 2022-04-11 LAB — TSH: TSH: 3.6 u[IU]/mL (ref 0.350–4.500)

## 2022-04-11 LAB — CBC
HCT: 33.8 % — ABNORMAL LOW (ref 36.0–46.0)
Hemoglobin: 10.4 g/dL — ABNORMAL LOW (ref 12.0–15.0)
MCH: 24.9 pg — ABNORMAL LOW (ref 26.0–34.0)
MCHC: 30.8 g/dL (ref 30.0–36.0)
MCV: 80.9 fL (ref 80.0–100.0)
Platelets: 255 10*3/uL (ref 150–400)
RBC: 4.18 MIL/uL (ref 3.87–5.11)
RDW: 15.8 % — ABNORMAL HIGH (ref 11.5–15.5)
WBC: 9 10*3/uL (ref 4.0–10.5)
nRBC: 0 % (ref 0.0–0.2)

## 2022-04-11 LAB — POCT I-STAT EG7
Acid-Base Excess: 1 mmol/L (ref 0.0–2.0)
Acid-Base Excess: 2 mmol/L (ref 0.0–2.0)
Bicarbonate: 26.5 mmol/L (ref 20.0–28.0)
Bicarbonate: 27.4 mmol/L (ref 20.0–28.0)
Calcium, Ion: 1.11 mmol/L — ABNORMAL LOW (ref 1.15–1.40)
Calcium, Ion: 1.11 mmol/L — ABNORMAL LOW (ref 1.15–1.40)
HCT: 33 % — ABNORMAL LOW (ref 36.0–46.0)
HCT: 33 % — ABNORMAL LOW (ref 36.0–46.0)
Hemoglobin: 11.2 g/dL — ABNORMAL LOW (ref 12.0–15.0)
Hemoglobin: 11.2 g/dL — ABNORMAL LOW (ref 12.0–15.0)
O2 Saturation: 62 %
O2 Saturation: 62 %
Potassium: 3.8 mmol/L (ref 3.5–5.1)
Potassium: 3.8 mmol/L (ref 3.5–5.1)
Sodium: 139 mmol/L (ref 135–145)
Sodium: 139 mmol/L (ref 135–145)
TCO2: 28 mmol/L (ref 22–32)
TCO2: 29 mmol/L (ref 22–32)
pCO2, Ven: 43.8 mmHg — ABNORMAL LOW (ref 44–60)
pCO2, Ven: 44.8 mmHg (ref 44–60)
pH, Ven: 7.39 (ref 7.25–7.43)
pH, Ven: 7.394 (ref 7.25–7.43)
pO2, Ven: 33 mmHg (ref 32–45)
pO2, Ven: 33 mmHg (ref 32–45)

## 2022-04-11 LAB — COMPREHENSIVE METABOLIC PANEL
ALT: 12 U/L (ref 0–44)
AST: 16 U/L (ref 15–41)
Albumin: 2.8 g/dL — ABNORMAL LOW (ref 3.5–5.0)
Alkaline Phosphatase: 78 U/L (ref 38–126)
Anion gap: 14 (ref 5–15)
BUN: 23 mg/dL (ref 8–23)
CO2: 23 mmol/L (ref 22–32)
Calcium: 8 mg/dL — ABNORMAL LOW (ref 8.9–10.3)
Chloride: 102 mmol/L (ref 98–111)
Creatinine, Ser: 1.15 mg/dL — ABNORMAL HIGH (ref 0.44–1.00)
GFR, Estimated: 50 mL/min — ABNORMAL LOW (ref >60)
Glucose, Bld: 79 mg/dL (ref 70–99)
Potassium: 3.8 mmol/L (ref 3.5–5.1)
Sodium: 139 mmol/L (ref 135–145)
Total Bilirubin: 0.7 mg/dL (ref 0.3–1.2)
Total Protein: 5.9 g/dL — ABNORMAL LOW (ref 6.5–8.1)

## 2022-04-11 LAB — POCT I-STAT 7, (LYTES, BLD GAS, ICA,H+H)
Acid-Base Excess: 0 mmol/L (ref 0.0–2.0)
Bicarbonate: 24.9 mmol/L (ref 20.0–28.0)
Calcium, Ion: 1.11 mmol/L — ABNORMAL LOW (ref 1.15–1.40)
HCT: 33 % — ABNORMAL LOW (ref 36.0–46.0)
Hemoglobin: 11.2 g/dL — ABNORMAL LOW (ref 12.0–15.0)
O2 Saturation: 92 %
Potassium: 3.9 mmol/L (ref 3.5–5.1)
Sodium: 139 mmol/L (ref 135–145)
TCO2: 26 mmol/L (ref 22–32)
pCO2 arterial: 39 mmHg (ref 32–48)
pH, Arterial: 7.412 (ref 7.35–7.45)
pO2, Arterial: 62 mmHg — ABNORMAL LOW (ref 83–108)

## 2022-04-11 LAB — APTT: aPTT: 47 seconds — ABNORMAL HIGH (ref 24–36)

## 2022-04-11 LAB — HEPARIN LEVEL (UNFRACTIONATED): Heparin Unfractionated: >1.1 IU/mL — ABNORMAL HIGH (ref 0.30–0.70)

## 2022-04-11 SURGERY — RIGHT/LEFT HEART CATH AND CORONARY ANGIOGRAPHY
Anesthesia: LOCAL

## 2022-04-11 MED ORDER — FUROSEMIDE 40 MG PO TABS
40.0000 mg | ORAL_TABLET | Freq: Every day | ORAL | Status: DC
  Administered 2022-04-11 – 2022-04-12 (×2): 40 mg via ORAL
  Filled 2022-04-11 (×2): qty 1

## 2022-04-11 MED ORDER — VERAPAMIL HCL 2.5 MG/ML IV SOLN
INTRAVENOUS | Status: AC
  Filled 2022-04-11: qty 2

## 2022-04-11 MED ORDER — HEPARIN (PORCINE) 25000 UT/250ML-% IV SOLN
1200.0000 [IU]/h | INTRAVENOUS | Status: DC
  Administered 2022-04-11: 1200 [IU]/h via INTRAVENOUS
  Filled 2022-04-11: qty 250

## 2022-04-11 MED ORDER — HEPARIN SODIUM (PORCINE) 1000 UNIT/ML IJ SOLN
INTRAMUSCULAR | Status: DC | PRN
  Administered 2022-04-11: 5000 [IU] via INTRAVENOUS

## 2022-04-11 MED ORDER — SODIUM CHLORIDE 0.9% FLUSH: 3.0000 mL | INTRAVENOUS | Status: DC | PRN

## 2022-04-11 MED ORDER — IOHEXOL 350 MG/ML SOLN
INTRAVENOUS | Status: DC | PRN
  Administered 2022-04-11: 40 mL

## 2022-04-11 MED ORDER — VERAPAMIL HCL 2.5 MG/ML IV SOLN
INTRAVENOUS | Status: DC | PRN
  Administered 2022-04-11: 10 mL via INTRA_ARTERIAL

## 2022-04-11 MED ORDER — LIDOCAINE HCL (PF) 1 % IJ SOLN
INTRAMUSCULAR | Status: AC
  Filled 2022-04-11: qty 30

## 2022-04-11 MED ORDER — MIDAZOLAM HCL 2 MG/2ML IJ SOLN
INTRAMUSCULAR | Status: AC
  Filled 2022-04-11: qty 2

## 2022-04-11 MED ORDER — SODIUM CHLORIDE 0.9% FLUSH
3.0000 mL | Freq: Two times a day (BID) | INTRAVENOUS | Status: DC
  Administered 2022-04-11 – 2022-04-12 (×2): 3 mL via INTRAVENOUS

## 2022-04-11 MED ORDER — LABETALOL HCL 5 MG/ML IV SOLN: 10.0000 mg | INTRAVENOUS | Status: AC | PRN

## 2022-04-11 MED ORDER — ONDANSETRON HCL 4 MG/2ML IJ SOLN: 4.0000 mg | Freq: Four times a day (QID) | INTRAMUSCULAR | Status: DC | PRN

## 2022-04-11 MED ORDER — SODIUM CHLORIDE 0.9 % IV SOLN: 250.0000 mL | INTRAVENOUS | Status: DC | PRN

## 2022-04-11 MED ORDER — MIDAZOLAM HCL 2 MG/2ML IJ SOLN
INTRAMUSCULAR | Status: DC | PRN
  Administered 2022-04-11: 1 mg via INTRAVENOUS
  Administered 2022-04-11: 2 mg via INTRAVENOUS

## 2022-04-11 MED ORDER — HEPARIN SODIUM (PORCINE) 1000 UNIT/ML IJ SOLN
INTRAMUSCULAR | Status: AC
  Filled 2022-04-11: qty 10

## 2022-04-11 MED ORDER — HEPARIN (PORCINE) IN NACL 1000-0.9 UT/500ML-% IV SOLN
INTRAVENOUS | Status: AC
  Filled 2022-04-11: qty 1000

## 2022-04-11 MED ORDER — FENTANYL CITRATE (PF) 100 MCG/2ML IJ SOLN
INTRAMUSCULAR | Status: DC | PRN
  Administered 2022-04-11 (×2): 25 ug via INTRAVENOUS

## 2022-04-11 MED ORDER — LIDOCAINE HCL (PF) 1 % IJ SOLN
INTRAMUSCULAR | Status: DC | PRN
  Administered 2022-04-11: 2 mL

## 2022-04-11 MED ORDER — HYDRALAZINE HCL 20 MG/ML IJ SOLN: 10.0000 mg | INTRAMUSCULAR | Status: AC | PRN

## 2022-04-11 MED ORDER — LOSARTAN POTASSIUM 25 MG PO TABS
25.0000 mg | ORAL_TABLET | Freq: Every day | ORAL | Status: DC
  Administered 2022-04-11 – 2022-04-12 (×2): 25 mg via ORAL
  Filled 2022-04-11 (×2): qty 1

## 2022-04-11 MED ORDER — FENTANYL CITRATE (PF) 100 MCG/2ML IJ SOLN
INTRAMUSCULAR | Status: AC
  Filled 2022-04-11: qty 2

## 2022-04-11 MED ORDER — ACETAMINOPHEN 325 MG PO TABS: 650.0000 mg | ORAL_TABLET | ORAL | Status: DC | PRN

## 2022-04-11 SURGICAL SUPPLY — 14 items
CATH 5FR JL3.5 JR4 ANG PIG MP (CATHETERS) IMPLANT
CATH INFINITI 5FR AL1 (CATHETERS) IMPLANT
CATH SWAN GANZ 7F STRAIGHT (CATHETERS) IMPLANT
DEVICE RAD COMP TR BAND LRG (VASCULAR PRODUCTS) IMPLANT
GLIDESHEATH SLEND SS 6F .021 (SHEATH) IMPLANT
GLIDESHEATH SLENDER 7FR .021G (SHEATH) IMPLANT
GUIDEWIRE INQWIRE 1.5J.035X260 (WIRE) IMPLANT
INQWIRE 1.5J .035X260CM (WIRE) ×1
KIT HEART LEFT (KITS) ×2 IMPLANT
MAT PREVALON FULL STRYKER (MISCELLANEOUS) IMPLANT
PACK CARDIAC CATHETERIZATION (CUSTOM PROCEDURE TRAY) ×2 IMPLANT
SHEATH PROBE COVER 6X72 (BAG) IMPLANT
TRANSDUCER W/STOPCOCK (MISCELLANEOUS) ×2 IMPLANT
TUBING CIL FLEX 10 FLL-RA (TUBING) ×2 IMPLANT

## 2022-04-11 NOTE — Progress Notes (Signed)
Cardiology Progress Note  Patient ID: Leslie [name] MRN: [id number] DOB: [birth year] Date of Encounter: 04/11/2022  Primary Cardiologist: Sanda Klein, MD  Subjective   Chief Complaint: SOB  HPI: Hypotensive yesterday.  Blood pressure medications held.  Given some intravenous fluids.  Left heart catheterization and right heart catheterization planned for today.  ROS:  All other ROS reviewed and negative. Pertinent positives noted in the HPI.     Inpatient Medications  Scheduled Meds:  amiodarone  200 mg Oral Daily   aspirin  81 mg Oral Daily   atorvastatin  40 mg Oral Daily   budesonide (PULMICORT) nebulizer solution  0.25 mg Nebulization BID   docusate sodium  100 mg Oral BID   fluticasone  2 spray Each Nare QHS   levothyroxine  50 mcg Oral QAC breakfast   loratadine  10 mg Oral Daily   magnesium oxide  400 mg Oral Daily   metoprolol succinate  100 mg Oral q AM   pantoprazole  40 mg Oral Daily   sodium chloride flush  3 mL Intravenous Q12H   sodium chloride flush  3 mL Intravenous Q12H   Continuous Infusions:  sodium chloride     sodium chloride 10 mL/hr at 04/11/22 0609   heparin 1,200 Units/hr (04/11/22 0612)   PRN Meds: sodium chloride, acetaminophen, bisacodyl, ondansetron **OR** ondansetron (ZOFRAN) IV, oxyCODONE, polyethylene glycol, sodium chloride flush, traZODone   Vital Signs   Vitals:   04/10/22 2019 04/11/22 0555 04/11/22 0720 04/11/22 0822  BP:  122/66  123/83  Pulse:  77  77  Resp:  16  16  Temp:  97.9 F (36.6 C)  97.9 F (36.6 C)  TempSrc:  Oral  Oral  SpO2: 96% 97% 93% 99%  Weight:      Height:        Intake/Output Summary (Last 24 hours) at 04/11/2022 0945 Last data filed at 04/11/2022 0900 Gross per 24 hour  Intake 1022.3 ml  Output 1325 ml  Net -302.7 ml      04/10/2022    1:13 AM 04/10/2022   12:54 AM 04/09/2022    2:48 PM  Last 3 Weights  Weight (lbs) 266 lb 8.6 oz 266 lb 8.6 oz 269 lb 6.4 oz  Weight (kg) 120.9 kg 120.9  kg 122.2 kg      Telemetry  Overnight telemetry shows SR 80, which I personally reviewed.    Physical Exam   Vitals:   04/10/22 2019 04/11/22 0555 04/11/22 0720 04/11/22 0822  BP:  122/66  123/83  Pulse:  77  77  Resp:  16  16  Temp:  97.9 F (36.6 C)  97.9 F (36.6 C)  TempSrc:  Oral  Oral  SpO2: 96% 97% 93% 99%  Weight:      Height:        Intake/Output Summary (Last 24 hours) at 04/11/2022 0945 Last data filed at 04/11/2022 0900 Gross per 24 hour  Intake 1022.3 ml  Output 1325 ml  Net -302.7 ml       04/10/2022    1:13 AM 04/10/2022   12:54 AM 04/09/2022    2:48 PM  Last 3 Weights  Weight (lbs) 266 lb 8.6 oz 266 lb 8.6 oz 269 lb 6.4 oz  Weight (kg) 120.9 kg 120.9 kg 122.2 kg    Body mass index is 43.02 kg/m.  General: Well nourished, well developed, in no acute distress Head: Atraumatic, normal size  Eyes: PEERLA, EOMI  Neck: Supple, no JVD Endocrine:  No thryomegaly Cardiac: Normal S1, S2; RRR; no murmurs, rubs, or gallops Lungs: Clear to auscultation bilaterally, no wheezing, rhonchi or rales  Abd: Soft, nontender, no hepatomegaly  Ext: No edema, pulses 2+ Musculoskeletal: No deformities, BUE and BLE strength normal and equal Skin: Warm and dry, no rashes   Neuro: Alert and oriented to person, place, time, and situation, CNII-XII grossly intact, no focal deficits  Psych: Normal mood and affect   Labs  High Sensitivity Troponin:   Recent Labs  Lab 04/09/22 0255 04/09/22 0430 04/09/22 1453 04/09/22 1653  TROPONINIHS 117* 292* 876* 716*     Cardiac EnzymesNo results for input(s): "TROPONINI" in the last 168 hours. No results for input(s): "TROPIPOC" in the last 168 hours.  Chemistry Recent Labs  Lab 04/09/22 0255 04/10/22 0231 04/11/22 0332  NA 138 139 139  K 4.1 3.9 3.8  CL 106 100 102  CO2 21* 26 23  GLUCOSE 163* 123* 79  BUN '20 18 23  '$ CREATININE 0.96 1.05* 1.15*  CALCIUM 8.4* 8.4* 8.0*  PROT 6.8  --  5.9*  ALBUMIN 3.4*  --  2.8*  AST  20  --  16  ALT 14  --  12  ALKPHOS 99  --  78  BILITOT 0.9  --  0.7  GFRNONAA >60 55* 50*  ANIONGAP '11 13 14    '$ Hematology Recent Labs  Lab 04/09/22 0255 04/10/22 0231 04/11/22 0332  WBC 21.9* 13.9* 9.0  RBC 4.80 4.55 4.18  HGB 11.8* 11.4* 10.4*  HCT 39.7 36.3 33.8*  MCV 82.7 79.8* 80.9  MCH 24.6* 25.1* 24.9*  MCHC 29.7* 31.4 30.8  RDW 15.6* 15.7* 15.8*  PLT 372 323 255   BNP Recent Labs  Lab 04/09/22 0255  BNP 702.6*    DDimer No results for input(s): "DDIMER" in the last 168 hours.   Radiology  ECHOCARDIOGRAM COMPLETE  Result Date: 04/09/2022    ECHOCARDIOGRAM REPORT   Patient Name:   Leslie [name] Date of Exam: 04/09/2022 Medical Rec #:  [id number]       Height:       65.5 in Accession #:    [id number]      Weight:       273.4 lb Date of Birth:  [birth year]       BSA:          2.272 m Patient Age:    76 years        BP:           128/89 mmHg Patient Gender: F               HR:           90 bpm. Exam Location:  Inpatient Procedure: 2D Echo, Cardiac Doppler and Color Doppler Indications:    CHF - acute systolic  History:        Patient has prior history of Echocardiogram examinations, most                 recent 06/01/2020. Arrythmias:Atrial Fibrillation,                 Signs/Symptoms:Shortness of Breath, Edema and Fatigue; Risk                 Factors:Hypertension, Dyslipidemia, Diabetes and Sleep Apnea.                 CKD.  Sonographer:    Eartha Inch Referring Phys: Karmen Bongo  Sonographer Comments: Patient is obese.  Image acquisition challenging due to patient body habitus and Image acquisition challenging due to respiratory motion. IMPRESSIONS  1. Left ventricular ejection fraction, by estimation, is 25 to 30%. The left ventricle has severely decreased function. The left ventricle demonstrates regional wall motion abnormalities (see scoring diagram/findings for description). There is mild left  ventricular hypertrophy. Left ventricular diastolic parameters are  indeterminate.  2. Right ventricular systolic function is normal. The right ventricular size is normal. Tricuspid regurgitation signal is inadequate for assessing PA pressure.  3. A small pericardial effusion is present. The pericardial effusion is anterior to the right ventricle.  4. The mitral valve is normal in structure. Moderate mitral valve regurgitation. No evidence of mitral stenosis.  5. The aortic valve is tricuspid. There is mild calcification of the aortic valve. Aortic valve regurgitation is moderate - incompletely interrogated. No aortic stenosis is present.  6. The inferior vena cava is dilated in size with >50% respiratory variability, suggesting right atrial pressure of 8 mmHg. FINDINGS  Left Ventricle: Left ventricular ejection fraction, by estimation, is 25 to 30%. The left ventricle has severely decreased function. The left ventricle demonstrates regional wall motion abnormalities. The left ventricular internal cavity size was normal  in size. There is mild left ventricular hypertrophy. Left ventricular diastolic parameters are indeterminate.  LV Wall Scoring: The mid and distal anterior septum and mid inferoseptal segment are akinetic. The entire anterior wall, antero-lateral wall, mid and distal lateral wall, and entire inferior wall are hypokinetic. The basal anteroseptal segment, basal inferolateral segment, and basal inferoseptal segment are normal. Right Ventricle: The right ventricular size is normal. No increase in right ventricular wall thickness. Right ventricular systolic function is normal. Tricuspid regurgitation signal is inadequate for assessing PA pressure. Left Atrium: Left atrial size was normal in size. Right Atrium: Right atrial size was normal in size. Pericardium: A small pericardial effusion is present. The pericardial effusion is anterior to the right ventricle. Mitral Valve: The mitral valve is normal in structure. Moderate mitral valve regurgitation. No evidence of  mitral valve stenosis. Tricuspid Valve: The tricuspid valve is normal in structure. Tricuspid valve regurgitation is not demonstrated. No evidence of tricuspid stenosis. Aortic Valve: The aortic valve is tricuspid. There is mild calcification of the aortic valve. Aortic valve regurgitation is moderate. Aortic regurgitation PHT measures 255 msec. No aortic stenosis is present. Pulmonic Valve: The pulmonic valve was grossly normal. Pulmonic valve regurgitation is trivial. No evidence of pulmonic stenosis. Aorta: The aortic root is normal in size and structure. Venous: The inferior vena cava is dilated in size with greater than 50% respiratory variability, suggesting right atrial pressure of 8 mmHg. IAS/Shunts: The atrial septum is grossly normal.  LEFT VENTRICLE PLAX 2D LVIDd:         5.20 cm      Diastology LVIDs:         3.70 cm      LV e' medial:    3.09 cm/s LV PW:         1.20 cm      LV E/e' medial:  40.1 LV IVS:        1.10 cm      LV e' lateral:   4.74 cm/s LVOT diam:     2.00 cm      LV E/e' lateral: 26.2 LV SV:         70 LV SV Index:   31 LVOT Area:     3.14 cm  LV Volumes (MOD) LV vol d,  MOD A2C: 94.5 ml LV vol d, MOD A4C: 148.0 ml LV vol s, MOD A2C: 58.6 ml LV vol s, MOD A4C: 80.8 ml LV SV MOD A2C:     35.9 ml LV SV MOD A4C:     148.0 ml LV SV MOD BP:      54.5 ml RIGHT VENTRICLE            IVC RV S prime:     9.46 cm/s  IVC diam: 2.20 cm TAPSE (M-mode): 2.3 cm LEFT ATRIUM             Index        RIGHT ATRIUM           Index LA diam:        4.90 cm 2.16 cm/m   RA Area:     16.50 cm LA Vol (A2C):   55.4 ml 24.38 ml/m  RA Volume:   35.30 ml  15.54 ml/m LA Vol (A4C):   69.5 ml 30.59 ml/m LA Biplane Vol: 62.8 ml 27.64 ml/m  AORTIC VALVE LVOT Vmax:   118.00 cm/s LVOT Vmean:  87.700 cm/s LVOT VTI:    0.222 m AI PHT:      255 msec  AORTA Ao Root diam: 2.70 cm Ao Asc diam:  3.40 cm MITRAL VALVE MV Area (PHT): 4.99 cm     SHUNTS MV Decel Time: 152 msec     Systemic VTI:  0.22 m MR Peak grad: 71.2 mmHg      Systemic Diam: 2.00 cm MR Mean grad: 55.0 mmHg MR Vmax:      422.00 cm/s MR Vmean:     353.0 cm/s MV E velocity: 124.00 cm/s MV A velocity: 90.00 cm/s MV E/A ratio:  1.38 Cherlynn Kaiser MD Electronically signed by Cherlynn Kaiser MD Signature Date/Time: 04/09/2022/5:48:16 PM    Final     Cardiac Studies  TTE 04/09/2022  1. Left ventricular ejection fraction, by estimation, is 25 to 30%. The  left ventricle has severely decreased function. The left ventricle  demonstrates regional wall motion abnormalities (see scoring  diagram/findings for description). There is mild left   ventricular hypertrophy. Left ventricular diastolic parameters are  indeterminate.   2. Right ventricular systolic function is normal. The right ventricular  size is normal. Tricuspid regurgitation signal is inadequate for assessing  PA pressure.   3. A small pericardial effusion is present. The pericardial effusion is  anterior to the right ventricle.   4. The mitral valve is normal in structure. Moderate mitral valve  regurgitation. No evidence of mitral stenosis.   5. The aortic valve is tricuspid. There is mild calcification of the  aortic valve. Aortic valve regurgitation is moderate - incompletely  interrogated. No aortic stenosis is present.   6. The inferior vena cava is dilated in size with >50% respiratory  variability, suggesting right atrial pressure of 8 mmHg.   Patient Profile  76 year old female with history of persistent atrial fibrillation on flecainide, HFpEF, GERD, hypertension, CKD stage III who was admitted on 04/09/2022 for hypertensive crisis and acute systolic heart failure.   Assessment & Plan   # Acute systolic heart failure, EF 25-30% -Suspect this is a stress-induced cardiomyopathy in the setting of hypertensive crisis. -N.p.o. for left and right heart catheterization today. -No further diuresis.  She appears euvolemic to me. -Continue metoprolol succinate 100 mg in the morning and 50  mg in the evening. -Add losartan 25 mg daily. -This will be an acceptable regimen to  go home with.  Further recommendations pending left and right heart catheterization.  Shared Decision Making/Informed Consent The risks [stroke (1 in 1000), death (1 in 1000), kidney failure [usually temporary] (1 in 500), bleeding (1 in 200), allergic reaction [possibly serious] (1 in 200)], benefits (diagnostic support and management of coronary artery disease) and alternatives of a cardiac catheterization were discussed in detail with Ms. Aung and she is willing to proceed.   # Elevated troponin, demand ischemia -Wall motion abnormality out of proportion to troponin elevation.  Again I suspect this is a stress-induced pattern. -She is on heparin due to bridging for atrial fibrillation. -She is on aspirin and statin.  Further recommendations pending left heart catheterization today.  # Persistent atrial fibrillation -Eliquis being held for left and right heart catheterization today.  On heparin drip.  Add back Eliquis after heart cath today.  We will guide you on this. -She cannot be on flecainide.  She has been transition amiodarone while her left ventricular function is reduced.  Hopefully she can get back on flecainide if she has no CAD and heart function returns to normal in several months.  #HTN Crisis -metoprolol and losartan.     For questions or updates, please contact [place] Please consult [email or web address] for contact info under   Signed, [place] B[place] T. Audie Box, MD, [place]  04/11/2022 9:45 AM

## 2022-04-11 NOTE — Progress Notes (Signed)
Pt refusing cpap for the night. ?

## 2022-04-11 NOTE — Assessment & Plan Note (Signed)
Due to Afib

## 2022-04-11 NOTE — Interval H&P Note (Signed)
Cath Lab Visit (complete for each Cath Lab visit)  Clinical Evaluation Leading to the Procedure:   ACS: Yes.    Non-ACS:    Anginal Classification: CCS IV  Anti-ischemic medical therapy: Minimal Therapy (1 class of medications)  Non-Invasive Test Results: High-risk stress test findings: cardiac mortality >3%/year  Prior CABG: No previous CABG  Low EF by echo     History and Physical Interval Note:  04/11/2022 1:49 PM  Leslie [name]  has presented today for surgery, with the diagnosis of Acute Systolic Heart Failure.  The various methods of treatment have been discussed with the patient and family. After consideration of risks, benefits and other options for treatment, the patient has consented to  Procedure(s): RIGHT/LEFT HEART CATH AND CORONARY ANGIOGRAPHY (N/A) as a surgical intervention.  The patient's history has been reviewed, patient examined, no change in status, stable for surgery.  I have reviewed the patient's chart and labs.  Questions were answered to the patient's satisfaction.     Larae Grooms

## 2022-04-11 NOTE — Progress Notes (Signed)
ANTICOAGULATION CONSULT NOTE  Pharmacy Consult for Heparin  Indication: atrial fibrillation  Allergies  Allergen Reactions   Clindamycin Hcl     Lip and face swelling   Augmentin [Amoxicillin-Pot Clavulanate] Diarrhea    Did it involve swelling of the face/tongue/throat, SOB, or low BP? No Did it involve sudden or severe rash/hives, skin peeling, or any reaction on the inside of your mouth or nose? No Did you need to seek medical attention at a hospital or doctor's office? No When did it last happen?      10 + years If all above answers are "NO", may proceed with cephalosporin use.    Codeine Nausea Only and Other (See Comments)    Hyperactivity   Demerol [Meperidine] Nausea And Vomiting   Sulfa Antibiotics Rash    Patient Measurements: Height: '5\' 6"'$  (167.6 cm) Weight: 120.9 kg (266 lb 8.6 oz) IBW/kg (Calculated) : 59.3 Heparin Dosing Weight: 88.5 kg  Vital Signs: Temp: 97.4 F (36.3 C) (12/28 1514) Temp Source: Oral (12/28 1514) BP: 127/65 (12/28 1541) Pulse Rate: 76 (12/28 1541)  Labs: Recent Labs    04/09/22 0255 04/09/22 0430 04/09/22 1453 04/09/22 1653 04/10/22 0231 04/11/22 0332 04/11/22 1418 04/11/22 1425  HGB 11.8*  --   --   --  11.4* 10.4* 11.2* 11.2*  11.2*  HCT 39.7  --   --   --  36.3 33.8* 33.0* 33.0*  33.0*  PLT 372  --   --   --  323 255  --   --   APTT  --   --   --   --   --  47*  --   --   HEPARINUNFRC  --   --   --   --   --  >1.10*  --   --   CREATININE 0.96  --   --   --  1.05* 1.15*  --   --   TROPONINIHS 117* 292* 876* 716*  --   --   --   --      Estimated Creatinine Clearance: 56 mL/min (A) (by C-G formula based on SCr of 1.15 mg/dL (H)).  Assessment: 76 y.o. female with h/o Afib, Eliquis on hold, for heparin. S/p R/L heart cath today. Pharmacy to resume heparin 2 hours post band removal (removed ~2000)  Goal of Therapy:  Heparin level 0.3-0.7 units/ml aPTT 66-102 seconds Monitor platelets by anticoagulation protocol: Yes    Plan:  Resume heparin at 1200 units/hr tonight at 2200 F/u heparin level 8h after drip resumed F/u ability to resume Eliquis   Dimple Nanas, PharmD, BCPS 04/11/2022 8:15 PM

## 2022-04-11 NOTE — Progress Notes (Signed)
  Progress Note   Patient: Leslie [name] ZOX:[id number] DOB: 07-05-1945 DOA: 04/09/2022     2 DOS: the patient was seen and examined on 04/11/2022 at 7:59 AM      Brief hospital course: Leslie [name] is a 76 y.o. F with persAF on flecainide and Eliquis, dCHF, CKD IIIa baseline 1.1, HTN, and MO who presented with several weeks progression of shortness of breath.     12/26: Admitted on Lasix, Cardiology consutled, Echo shows new reduced EF 12/27: Hypotensive with addition of GDMT 12/28: BP normal, to [facility]     Assessment and Plan: * Acute on chronic combined systolic and diastolic CHF (congestive heart failure) (HCC) EF newly down to 25-30%. 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  Hypothyroidism - Continue levothyroxine  Morbid obesity ([place]) BMI 43  Pure hypercholesterolemia - Continue Lipitor  Essential hypertension, benign Blood pressure dropped 12/27 with addition of GDMT.  Asymptomatic and blood pressure resolved overnight - MRA, ARB, ARNI, BB, diuretics per cardiology            Subjective: Patient comfortable, slept poorly, no new fever, cough,  change in respiratory symptoms, no new chest pain, palpitations, swelling.     Physical Exam: BP 108/66   Pulse 77   Temp 97.9 F (36.6 C) (Oral)   Resp 17   Ht '5\' 6"'$  (1.676 m)   Wt 120.9 kg   SpO2 99%   BMI 43.02 kg/m   Obese adult female, lying in bed, no acute distress, interactive and appropriate RRR, no murmurs, peripheral edema is mild Lungs clear without rales or wheezes Abdomen soft without tenderness or guarding Attention normal, alert and oriented to person, place, time, and situation, face metric, speech fluent    Data Reviewed: TSH normal Blood count shows stable anemia Basic metabolic panel shows potassium 3.8, creatinine slightly up to 1.15  Family Communication: None present    Disposition: Status is: Inpatient Patient requires ongoing IV Lasix due to hypoxia due to congestive heart failure  Heart cath today        Author: Edwin Dada, MD 04/11/2022 1:11 PM  For on call review [email or web address].

## 2022-04-11 NOTE — H&P (View-Only) (Signed)
Cardiology Progress Note  Patient ID: Leslie [name] MRN: [id number] DOB: 09-Jun-1945 Date of Encounter: 04/11/2022  Primary Cardiologist: Sanda Klein, MD  Subjective   Chief Complaint: SOB  HPI: Hypotensive yesterday.  Blood pressure medications held.  Given some intravenous fluids.  Left heart catheterization and right heart catheterization planned for today.  ROS:  All other ROS reviewed and negative. Pertinent positives noted in the HPI.     Inpatient Medications  Scheduled Meds:  amiodarone  200 mg Oral Daily   aspirin  81 mg Oral Daily   atorvastatin  40 mg Oral Daily   budesonide (PULMICORT) nebulizer solution  0.25 mg Nebulization BID   docusate sodium  100 mg Oral BID   fluticasone  2 spray Each Nare QHS   levothyroxine  50 mcg Oral QAC breakfast   loratadine  10 mg Oral Daily   magnesium oxide  400 mg Oral Daily   metoprolol succinate  100 mg Oral q AM   pantoprazole  40 mg Oral Daily   sodium chloride flush  3 mL Intravenous Q12H   sodium chloride flush  3 mL Intravenous Q12H   Continuous Infusions:  sodium chloride     sodium chloride 10 mL/hr at 04/11/22 0609   heparin 1,200 Units/hr (04/11/22 0612)   PRN Meds: sodium chloride, acetaminophen, bisacodyl, ondansetron **OR** ondansetron (ZOFRAN) IV, oxyCODONE, polyethylene glycol, sodium chloride flush, traZODone   Vital Signs   Vitals:   04/10/22 2019 04/11/22 0555 04/11/22 0720 04/11/22 0822  BP:  122/66  123/83  Pulse:  77  77  Resp:  16  16  Temp:  97.9 F (36.6 C)  97.9 F (36.6 C)  TempSrc:  Oral  Oral  SpO2: 96% 97% 93% 99%  Weight:      Height:        Intake/Output Summary (Last 24 hours) at 04/11/2022 0945 Last data filed at 04/11/2022 0900 Gross per 24 hour  Intake 1022.3 ml  Output 1325 ml  Net -302.7 ml      04/10/2022    1:13 AM 04/10/2022   12:54 AM 04/09/2022    2:48 PM  Last 3 Weights  Weight (lbs) 266 lb 8.6 oz 266 lb 8.6 oz 269 lb 6.4 oz  Weight (kg) 120.9 kg 120.9  kg 122.2 kg      Telemetry  Overnight telemetry shows SR 80, which I personally reviewed.    Physical Exam   Vitals:   04/10/22 2019 04/11/22 0555 04/11/22 0720 04/11/22 0822  BP:  122/66  123/83  Pulse:  77  77  Resp:  16  16  Temp:  97.9 F (36.6 C)  97.9 F (36.6 C)  TempSrc:  Oral  Oral  SpO2: 96% 97% 93% 99%  Weight:      Height:        Intake/Output Summary (Last 24 hours) at 04/11/2022 0945 Last data filed at 04/11/2022 0900 Gross per 24 hour  Intake 1022.3 ml  Output 1325 ml  Net -302.7 ml       04/10/2022    1:13 AM 04/10/2022   12:54 AM 04/09/2022    2:48 PM  Last 3 Weights  Weight (lbs) 266 lb 8.6 oz 266 lb 8.6 oz 269 lb 6.4 oz  Weight (kg) 120.9 kg 120.9 kg 122.2 kg    Body mass index is 43.02 kg/m.  General: Well nourished, well developed, in no acute distress Head: Atraumatic, normal size  Eyes: PEERLA, EOMI  Neck: Supple, no JVD Endocrine:  No thryomegaly Cardiac: Normal S1, S2; RRR; no murmurs, rubs, or gallops Lungs: Clear to auscultation bilaterally, no wheezing, rhonchi or rales  Abd: Soft, nontender, no hepatomegaly  Ext: No edema, pulses 2+ Musculoskeletal: No deformities, BUE and BLE strength normal and equal Skin: Warm and dry, no rashes   Neuro: Alert and oriented to person, place, time, and situation, CNII-XII grossly intact, no focal deficits  Psych: Normal mood and affect   Labs  High Sensitivity Troponin:   Recent Labs  Lab 04/09/22 0255 04/09/22 0430 04/09/22 1453 04/09/22 1653  TROPONINIHS 117* 292* 876* 716*     Cardiac EnzymesNo results for input(s): "TROPONINI" in the last 168 hours. No results for input(s): "TROPIPOC" in the last 168 hours.  Chemistry Recent Labs  Lab 04/09/22 0255 04/10/22 0231 04/11/22 0332  NA 138 139 139  K 4.1 3.9 3.8  CL 106 100 102  CO2 21* 26 23  GLUCOSE 163* 123* 79  BUN '20 18 23  '$ CREATININE 0.96 1.05* 1.15*  CALCIUM 8.4* 8.4* 8.0*  PROT 6.8  --  5.9*  ALBUMIN 3.4*  --  2.8*  AST  20  --  16  ALT 14  --  12  ALKPHOS 99  --  78  BILITOT 0.9  --  0.7  GFRNONAA >60 55* 50*  ANIONGAP '11 13 14    '$ Hematology Recent Labs  Lab 04/09/22 0255 04/10/22 0231 04/11/22 0332  WBC 21.9* 13.9* 9.0  RBC 4.80 4.55 4.18  HGB 11.8* 11.4* 10.4*  HCT 39.7 36.3 33.8*  MCV 82.7 79.8* 80.9  MCH 24.6* 25.1* 24.9*  MCHC 29.7* 31.4 30.8  RDW 15.6* 15.7* 15.8*  PLT 372 323 255   BNP Recent Labs  Lab 04/09/22 0255  BNP 702.6*    DDimer No results for input(s): "DDIMER" in the last 168 hours.   Radiology  ECHOCARDIOGRAM COMPLETE  Result Date: 04/09/2022    ECHOCARDIOGRAM REPORT   Patient Name:   Leslie [name] Date of Exam: 04/09/2022 Medical Rec #:  [id number]       Height:       65.5 in Accession #:    [id number]      Weight:       273.4 lb Date of Birth:  05-06-1945       BSA:          2.272 m Patient Age:    76 years        BP:           128/89 mmHg Patient Gender: F               HR:           90 bpm. Exam Location:  Inpatient Procedure: 2D Echo, Cardiac Doppler and Color Doppler Indications:    CHF - acute systolic  History:        Patient has prior history of Echocardiogram examinations, most                 recent 06/01/2020. Arrythmias:Atrial Fibrillation,                 Signs/Symptoms:Shortness of Breath, Edema and Fatigue; Risk                 Factors:Hypertension, Dyslipidemia, Diabetes and Sleep Apnea.                 CKD.  Sonographer:    Eartha Inch Referring Phys: Karmen Bongo  Sonographer Comments: Patient is obese.  Image acquisition challenging due to patient body habitus and Image acquisition challenging due to respiratory motion. IMPRESSIONS  1. Left ventricular ejection fraction, by estimation, is 25 to 30%. The left ventricle has severely decreased function. The left ventricle demonstrates regional wall motion abnormalities (see scoring diagram/findings for description). There is mild left  ventricular hypertrophy. Left ventricular diastolic parameters are  indeterminate.  2. Right ventricular systolic function is normal. The right ventricular size is normal. Tricuspid regurgitation signal is inadequate for assessing PA pressure.  3. A small pericardial effusion is present. The pericardial effusion is anterior to the right ventricle.  4. The mitral valve is normal in structure. Moderate mitral valve regurgitation. No evidence of mitral stenosis.  5. The aortic valve is tricuspid. There is mild calcification of the aortic valve. Aortic valve regurgitation is moderate - incompletely interrogated. No aortic stenosis is present.  6. The inferior vena cava is dilated in size with >50% respiratory variability, suggesting right atrial pressure of 8 mmHg. FINDINGS  Left Ventricle: Left ventricular ejection fraction, by estimation, is 25 to 30%. The left ventricle has severely decreased function. The left ventricle demonstrates regional wall motion abnormalities. The left ventricular internal cavity size was normal  in size. There is mild left ventricular hypertrophy. Left ventricular diastolic parameters are indeterminate.  LV Wall Scoring: The mid and distal anterior septum and mid inferoseptal segment are akinetic. The entire anterior wall, antero-lateral wall, mid and distal lateral wall, and entire inferior wall are hypokinetic. The basal anteroseptal segment, basal inferolateral segment, and basal inferoseptal segment are normal. Right Ventricle: The right ventricular size is normal. No increase in right ventricular wall thickness. Right ventricular systolic function is normal. Tricuspid regurgitation signal is inadequate for assessing PA pressure. Left Atrium: Left atrial size was normal in size. Right Atrium: Right atrial size was normal in size. Pericardium: A small pericardial effusion is present. The pericardial effusion is anterior to the right ventricle. Mitral Valve: The mitral valve is normal in structure. Moderate mitral valve regurgitation. No evidence of  mitral valve stenosis. Tricuspid Valve: The tricuspid valve is normal in structure. Tricuspid valve regurgitation is not demonstrated. No evidence of tricuspid stenosis. Aortic Valve: The aortic valve is tricuspid. There is mild calcification of the aortic valve. Aortic valve regurgitation is moderate. Aortic regurgitation PHT measures 255 msec. No aortic stenosis is present. Pulmonic Valve: The pulmonic valve was grossly normal. Pulmonic valve regurgitation is trivial. No evidence of pulmonic stenosis. Aorta: The aortic root is normal in size and structure. Venous: The inferior vena cava is dilated in size with greater than 50% respiratory variability, suggesting right atrial pressure of 8 mmHg. IAS/Shunts: The atrial septum is grossly normal.  LEFT VENTRICLE PLAX 2D LVIDd:         5.20 cm      Diastology LVIDs:         3.70 cm      LV e' medial:    3.09 cm/s LV PW:         1.20 cm      LV E/e' medial:  40.1 LV IVS:        1.10 cm      LV e' lateral:   4.74 cm/s LVOT diam:     2.00 cm      LV E/e' lateral: 26.2 LV SV:         70 LV SV Index:   31 LVOT Area:     3.14 cm  LV Volumes (MOD) LV vol d,  MOD A2C: 94.5 ml LV vol d, MOD A4C: 148.0 ml LV vol s, MOD A2C: 58.6 ml LV vol s, MOD A4C: 80.8 ml LV SV MOD A2C:     35.9 ml LV SV MOD A4C:     148.0 ml LV SV MOD BP:      54.5 ml RIGHT VENTRICLE            IVC RV S prime:     9.46 cm/s  IVC diam: 2.20 cm TAPSE (M-mode): 2.3 cm LEFT ATRIUM             Index        RIGHT ATRIUM           Index LA diam:        4.90 cm 2.16 cm/m   RA Area:     16.50 cm LA Vol (A2C):   55.4 ml 24.38 ml/m  RA Volume:   35.30 ml  15.54 ml/m LA Vol (A4C):   69.5 ml 30.59 ml/m LA Biplane Vol: 62.8 ml 27.64 ml/m  AORTIC VALVE LVOT Vmax:   118.00 cm/s LVOT Vmean:  87.700 cm/s LVOT VTI:    0.222 m AI PHT:      255 msec  AORTA Ao Root diam: 2.70 cm Ao Asc diam:  3.40 cm MITRAL VALVE MV Area (PHT): 4.99 cm     SHUNTS MV Decel Time: 152 msec     Systemic VTI:  0.22 m MR Peak grad: 71.2 mmHg      Systemic Diam: 2.00 cm MR Mean grad: 55.0 mmHg MR Vmax:      422.00 cm/s MR Vmean:     353.0 cm/s MV E velocity: 124.00 cm/s MV A velocity: 90.00 cm/s MV E/A ratio:  1.38 Cherlynn Kaiser MD Electronically signed by Cherlynn Kaiser MD Signature Date/Time: 04/09/2022/5:48:16 PM    Final     Cardiac Studies  TTE 04/09/2022  1. Left ventricular ejection fraction, by estimation, is 25 to 30%. The  left ventricle has severely decreased function. The left ventricle  demonstrates regional wall motion abnormalities (see scoring  diagram/findings for description). There is mild left   ventricular hypertrophy. Left ventricular diastolic parameters are  indeterminate.   2. Right ventricular systolic function is normal. The right ventricular  size is normal. Tricuspid regurgitation signal is inadequate for assessing  PA pressure.   3. A small pericardial effusion is present. The pericardial effusion is  anterior to the right ventricle.   4. The mitral valve is normal in structure. Moderate mitral valve  regurgitation. No evidence of mitral stenosis.   5. The aortic valve is tricuspid. There is mild calcification of the  aortic valve. Aortic valve regurgitation is moderate - incompletely  interrogated. No aortic stenosis is present.   6. The inferior vena cava is dilated in size with >50% respiratory  variability, suggesting right atrial pressure of 8 mmHg.   Patient Profile  76 year old female with history of persistent atrial fibrillation on flecainide, HFpEF, GERD, hypertension, CKD stage III who was admitted on 04/09/2022 for hypertensive crisis and acute systolic heart failure.   Assessment & Plan   # Acute systolic heart failure, EF 25-30% -Suspect this is a stress-induced cardiomyopathy in the setting of hypertensive crisis. -N.p.o. for left and right heart catheterization today. -No further diuresis.  She appears euvolemic to me. -Continue metoprolol succinate 100 mg in the morning and 50  mg in the evening. -Add losartan 25 mg daily. -This will be an acceptable regimen to  go home with.  Further recommendations pending left and right heart catheterization.  Shared Decision Making/Informed Consent The risks [stroke (1 in 1000), death (1 in 1000), kidney failure [usually temporary] (1 in 500), bleeding (1 in 200), allergic reaction [possibly serious] (1 in 200)], benefits (diagnostic support and management of coronary artery disease) and alternatives of a cardiac catheterization were discussed in detail with Ms. Hollingshead and she is willing to proceed.   # Elevated troponin, demand ischemia -Wall motion abnormality out of proportion to troponin elevation.  Again I suspect this is a stress-induced pattern. -She is on heparin due to bridging for atrial fibrillation. -She is on aspirin and statin.  Further recommendations pending left heart catheterization today.  # Persistent atrial fibrillation -Eliquis being held for left and right heart catheterization today.  On heparin drip.  Add back Eliquis after heart cath today.  We will guide you on this. -She cannot be on flecainide.  She has been transition amiodarone while her left ventricular function is reduced.  Hopefully she can get back on flecainide if she has no CAD and heart function returns to normal in several months.  #HTN Crisis -metoprolol and losartan.     For questions or updates, please contact [place] Please consult [email or web address] for contact info under   Signed, Lake Bells T. Audie Box, MD, [place]  04/11/2022 9:45 AM

## 2022-04-11 NOTE — Assessment & Plan Note (Signed)
No active flare - Continue ICS, claritin, PPI

## 2022-04-11 NOTE — Progress Notes (Signed)
   Heart Failure Stewardship Pharmacist Progress Note   PCP: Glenis Smoker, MD PCP-Cardiologist: Sanda Klein, MD    HPI:  76 yo F with PMH of CHF, CKD III, afib, HTN, and HLD.   Presented to the ED on 12/26 with shortness of breath, orthopnea, and LE edema. CXR with cardiomegaly, vascular congestion, and edema. ECHO 12/26 showed LVEF 25-30% (was 55-60% in 05/2020), regional wall motion abnormalities, mild LVH, RV normal, moderate MR, and moderate AR. Scheduled for [facility] on 12/28. Asymptomatic hypotension 12/27 - GDMT held.  Current HF Medications: Beta Blocker: metoprolol XL 100 mg daily ACE/ARB/ARNI: losartan 25 mg daily  Prior to admission HF Medications: Diuretic: furosemide 20 mg daily Beta blocker: metoprolol XL 100 mg AM 50 mg PM *also taking verapamil 180 mg daily  Pertinent Lab Values: Serum creatinine 1.15, BUN 23, Potassium 3.8, Sodium 139, BNP 702.6   Vital Signs: Weight: 266 lbs (admission weight: 269 lbs) Blood pressure: 120/80s  Heart rate: 70s  I/O: -0.4L yesterday; net -1.5L  Medication Assistance / Insurance Benefits Check: Does the patient have prescription insurance?  Yes Type of insurance plan: [employer] Medicare  Outpatient Pharmacy:  Prior to admission outpatient pharmacy: [facility] Is the patient willing to use [facility] at discharge? Yes Is the patient willing to transition their outpatient pharmacy to utilize a [facility] outpatient pharmacy?   Pending    Assessment: 1. Acute on chronic systolic and diastolic CHF (LVEF 09-81%), pending ischemic evaluation. NYHA class II symptoms. - Off IV lasix. Euvolemic on exam. RHC later today to confirm volume status. Strict I/Os and daily weights. Keep K>4. Check magnesium in AM. - Continue metoprolol XL 100 mg daily  - Agree with starting losartan 25 mg daily  - Holding spironolactone 25 mg daily with hypotension - Consider starting SGLT2i after cath tomorrow - Off verapamil  and flecainide with LV dysfunction, started on amiodarone to maintain NSR   Plan: 1) Medication changes recommended at this time: - Agree with changes - Start Farxiga 10 mg daily after cath - Stop verapamil and flecainide at discharge  2) Patient assistance: Wilder Glade copay $138.02 - Jardiance copay $144.86 Delene Loll copay $163.08 - She is in the donut hole and this is why copays are high right now, recommend test claims starting 1/1 when she is no longer in the donut hole. Can use free 30 day cards in the meantime.  - Can begin patient assistance if copays are too high in the new year  3)  Education  - Patient has been educated on current HF medications and potential additions to HF medication regimen - Patient verbalizes understanding that over the next few months, these medication doses may change and more medications may be added to optimize HF regimen - Patient has been educated on basic disease state pathophysiology and goals of therapy   Kerby Nora, PharmD, BCPS Heart Failure Stewardship Pharmacist Phone [phone]

## 2022-04-11 NOTE — Progress Notes (Signed)
ANTICOAGULATION CONSULT NOTE  Pharmacy Consult for Heparin  Indication: atrial fibrillation Brief A/P: aPTT subtherapeutic Increase Heparin rate Allergies  Allergen Reactions   Clindamycin Hcl     Lip and face swelling   Augmentin [Amoxicillin-Pot Clavulanate] Diarrhea    Did it involve swelling of the face/tongue/throat, SOB, or low BP? No Did it involve sudden or severe rash/hives, skin peeling, or any reaction on the inside of your mouth or nose? No Did you need to seek medical attention at a hospital or doctor's office? No When did it last happen?      10 + years If all above answers are "NO", may proceed with cephalosporin use.    Codeine Nausea Only and Other (See Comments)    Hyperactivity   Demerol [Meperidine] Nausea And Vomiting   Sulfa Antibiotics Rash    Patient Measurements: Height: '5\' 6"'$  (167.6 cm) Weight: 120.9 kg (266 lb 8.6 oz) IBW/kg (Calculated) : 59.3 Heparin Dosing Weight: 88.5 kg  Vital Signs: Temp: 97.6 F (36.4 C) (12/27 1927) Temp Source: Oral (12/27 1927) BP: 109/69 (12/27 1927) Pulse Rate: 77 (12/27 1927)  Labs: Recent Labs    04/09/22 0255 04/09/22 0430 04/09/22 1453 04/09/22 1653 04/10/22 0231 04/11/22 0332  HGB 11.8*  --   --   --  11.4* 10.4*  HCT 39.7  --   --   --  36.3 33.8*  PLT 372  --   --   --  323 255  APTT  --   --   --   --   --  47*  HEPARINUNFRC  --   --   --   --   --  >1.10*  CREATININE 0.96  --   --   --  1.05* 1.15*  TROPONINIHS 117* 292* 876* 716*  --   --      Estimated Creatinine Clearance: 56 mL/min (A) (by C-G formula based on SCr of 1.15 mg/dL (H)).  Assessment: 76 y.o. female with h/o Afib, Eliquis on hold, for heparin  Goal of Therapy:  Heparin level 0.3-0.7 units/ml aPTT 66-102 seconds Monitor platelets by anticoagulation protocol: Yes   Plan:  Increase Heparin 1200 units/hr Follow up after cath today   Phillis Knack, PharmD, BCPS   04/11/2022 5:38 AM

## 2022-04-12 ENCOUNTER — Other Ambulatory Visit (HOSPITAL_COMMUNITY): Payer: Self-pay

## 2022-04-12 ENCOUNTER — Encounter (HOSPITAL_COMMUNITY): Payer: Self-pay | Admitting: Interventional Cardiology

## 2022-04-12 ENCOUNTER — Telehealth: Payer: Self-pay | Admitting: Cardiology

## 2022-04-12 DIAGNOSIS — I4819 Other persistent atrial fibrillation: Secondary | ICD-10-CM

## 2022-04-12 DIAGNOSIS — I5043 Acute on chronic combined systolic (congestive) and diastolic (congestive) heart failure: Secondary | ICD-10-CM | POA: Diagnosis not present

## 2022-04-12 LAB — CBC
HCT: 34.8 % — ABNORMAL LOW (ref 36.0–46.0)
Hemoglobin: 10.5 g/dL — ABNORMAL LOW (ref 12.0–15.0)
MCH: 24.5 pg — ABNORMAL LOW (ref 26.0–34.0)
MCHC: 30.2 g/dL (ref 30.0–36.0)
MCV: 81.3 fL (ref 80.0–100.0)
Platelets: 302 10*3/uL (ref 150–400)
RBC: 4.28 MIL/uL (ref 3.87–5.11)
RDW: 15.8 % — ABNORMAL HIGH (ref 11.5–15.5)
WBC: 8.6 10*3/uL (ref 4.0–10.5)
nRBC: 0 % (ref 0.0–0.2)

## 2022-04-12 LAB — BASIC METABOLIC PANEL
Anion gap: 12 (ref 5–15)
BUN: 22 mg/dL (ref 8–23)
CO2: 25 mmol/L (ref 22–32)
Calcium: 8.3 mg/dL — ABNORMAL LOW (ref 8.9–10.3)
Chloride: 104 mmol/L (ref 98–111)
Creatinine, Ser: 1.06 mg/dL — ABNORMAL HIGH (ref 0.44–1.00)
GFR, Estimated: 55 mL/min — ABNORMAL LOW (ref >60)
Glucose, Bld: 97 mg/dL (ref 70–99)
Potassium: 4.1 mmol/L (ref 3.5–5.1)
Sodium: 141 mmol/L (ref 135–145)

## 2022-04-12 LAB — HEPARIN LEVEL (UNFRACTIONATED): Heparin Unfractionated: >1.1 IU/mL — ABNORMAL HIGH (ref 0.30–0.70)

## 2022-04-12 MED ORDER — APIXABAN 5 MG PO TABS
5.0000 mg | ORAL_TABLET | Freq: Two times a day (BID) | ORAL | Status: DC
  Administered 2022-04-12: 5 mg via ORAL
  Filled 2022-04-12: qty 1

## 2022-04-12 MED ORDER — ATORVASTATIN CALCIUM 40 MG PO TABS
40.0000 mg | ORAL_TABLET | Freq: Every day | ORAL | 3 refills | Status: DC
  Filled 2022-04-12: qty 30, 30d supply, fill #0

## 2022-04-12 MED ORDER — LOSARTAN POTASSIUM 25 MG PO TABS
25.0000 mg | ORAL_TABLET | Freq: Every day | ORAL | 3 refills | Status: DC
  Filled 2022-04-12: qty 30, 30d supply, fill #0

## 2022-04-12 MED ORDER — FUROSEMIDE 40 MG PO TABS
40.0000 mg | ORAL_TABLET | Freq: Every day | ORAL | 3 refills | Status: DC
  Filled 2022-04-12: qty 30, 30d supply, fill #0

## 2022-04-12 MED ORDER — AMIODARONE HCL 200 MG PO TABS
200.0000 mg | ORAL_TABLET | Freq: Every day | ORAL | 3 refills | Status: DC
  Filled 2022-04-12: qty 30, 30d supply, fill #0

## 2022-04-12 MED FILL — Heparin Sod (Porcine)-NaCl IV Soln 1000 Unit/500ML-0.9%: INTRAVENOUS | Qty: 500 | Status: AC

## 2022-04-12 NOTE — Progress Notes (Addendum)
   Heart Failure Stewardship Pharmacist Progress Note   PCP: Glenis Smoker, MD PCP-Cardiologist: Sanda Klein, MD    HPI:  76 yo F with PMH of CHF, CKD III, afib, HTN, and HLD.   Presented to the ED on 12/26 with shortness of breath, orthopnea, and LE edema. CXR with cardiomegaly, vascular congestion, and edema. ECHO 12/26 showed LVEF 25-30% (was 55-60% in 05/2020), regional wall motion abnormalities, mild LVH, RV normal, moderate MR, and moderate AR. R/LHC on 12/28 showed mild nonobstructive CAD with mild pulmonary hypertension.  Discharge HF Medications: Diuretic: furosemide 40 mg daily Beta Blocker: metoprolol XL 100 mg AM and 50 mg PM ACE/ARB/ARNI: losartan 25 mg daily  Prior to admission HF Medications: Diuretic: furosemide 20 mg daily Beta blocker: metoprolol XL 100 mg AM 50 mg PM *also taking verapamil 180 mg daily  Pertinent Lab Values: Serum creatinine 1.06, BUN 22, Potassium 4.1, Sodium 141, BNP 702.6   Vital Signs: Weight: 264 lbs (admission weight: 269 lbs) Blood pressure: 120-140/60s  Heart rate: 70-80s  I/O: -0.4L yesterday; net -1.8L  Medication Assistance / Insurance Benefits Check: Does the patient have prescription insurance?  Yes Type of insurance plan: [employer] Medicare  Outpatient Pharmacy:  [facility] to admission [facility] Is the patient willing to use [facility] at discharge? Yes Is the patient willing to transition their outpatient pharmacy to utilize a [facility] outpatient pharmacy?   Pending    Assessment: 1. Acute on chronic systolic and diastolic CHF (LVEF 51-88%), due to NICM. NYHA class II symptoms. - Agree with furosemide 40 mg daily at discharge - Continue metoprolol XL 100 mg daily  - Continue losartan 25 mg daily  - Holding spironolactone 25 mg daily with hypotension, consider restarting as outpatient - Consider starting SGLT2i at follow up - Off verapamil and flecainide with LV  dysfunction, started on amiodarone to maintain NSR   Plan: 1) Medication changes recommended at this time: - Start spironolactone vs SGLT2i at follow up  2) Patient assistance: - Farxiga copay $138.02 - Jardiance copay $144.86 - Entresto copay $163.08 - She is in the donut hole and this is why copays are high right now, recommend test claims starting 1/1 when she is no longer in the donut hole. Can use free 30 day cards in the meantime.  - Can begin patient assistance if copays are too high in the new year  3)  Education  - Patient has been educated on current HF medications and potential additions to HF medication regimen - Patient verbalizes understanding that over the next few months, these medication doses may change and more medications may be added to optimize HF regimen - Patient has been educated on basic disease state pathophysiology and goals of therapy   Kerby Nora, PharmD, BCPS Heart Failure Stewardship Pharmacist Phone [phone]

## 2022-04-12 NOTE — Evaluation (Signed)
Physical Therapy Evaluation Patient Details Name: Leslie [name] MRN: [id number] DOB: [birth year] Today's Date: 04/12/2022  History of Present Illness  Pt is a 76 y/o F presenting to ED on 12/26 from home with SOB and fluid gain. Admitted for acute CHF exacerbation. Pt evaluated by therapy on 04/10/22, now received new imminent d/c consult. PMH includes CH, CKD, arthritis, GERD, HTN, HLD, and OSA.  Clinical Impression  Pt admitted with above diagnosis.  Pt continues to present with mod I level and reports feeling stronger.  She was able to tolerate ambulation on RA with VSS.  BP stable with transfers.  Pt with good balance, self monitoring, and safety awareness.  Pt near baseline.  No further PT needs.   Pt on 1 L with sats 92%. Tried RA and sats 90% at lowest rest, 92% with activity, 94% post activity. Left on RA          Recommendations for follow up therapy are one component of a multi-disciplinary discharge planning process, led by the attending physician.  Recommendations may be updated based on patient status, additional functional criteria and insurance authorization.  Follow Up Recommendations No PT follow up      Assistance Recommended at Discharge PRN  Patient can return home with the following  Assist for transportation;Help with stairs or ramp for entrance    Equipment Recommendations None recommended by PT  Recommendations for Other Services       Functional Status Assessment Patient has not had a recent decline in their functional status     Precautions / Restrictions Precautions Precautions: Fall Restrictions Weight Bearing Restrictions: No      Mobility  Bed Mobility Overal bed mobility: Modified Independent             General bed mobility comments: in chair at arrival    Transfers Overall transfer level: Modified independent Equipment used: None Transfers: Sit to/from Stand Sit to Stand: Modified independent (Device/Increase time)            General transfer comment: Pt with dizziness first stand, self monitored and returned to sitting. VSS and she was improved on second stand.  Pt able to verbalize compensation techniques such as slow transitions, exercises/AROM prior to sitting, and prior to standing.  Good safety awareness and self monitoring    Ambulation/Gait Ambulation/Gait assistance: Modified independent (Device/Increase time) Gait Distance (Feet): 300 Feet Assistive device: None Gait Pattern/deviations: Step-through pattern       General Gait Details: Slow and steady pace; able to carry on conversation with activity and VSS  Stairs            Wheelchair Mobility    Modified Rankin (Stroke Patients Only)       Balance Overall balance assessment: Independent   Sitting balance-Leahy Scale: Normal       Standing balance-Leahy Scale: Good                               Pertinent Vitals/Pain Pain Assessment Pain Assessment: No/denies pain    Home Living Family/patient expects to be discharged to:: Private residence Living Arrangements: Alone Available Help at Discharge: Family;Friend(s);Available PRN/intermittently Type of Home: House Home Access: Stairs to enter   Entrance Stairs-Number of Steps: 3   Home Layout: One level Home Equipment: Shower seat - built in      Prior Function Prior Level of Function : Independent/Modified Independent;Driving  Mobility Comments: no AD use ADLs Comments: has housekeeper for cleaning     Hand Dominance        Extremity/Trunk Assessment   Upper Extremity Assessment Upper Extremity Assessment: Overall WFL for tasks assessed    Lower Extremity Assessment Lower Extremity Assessment: Overall WFL for tasks assessed    Cervical / Trunk Assessment Cervical / Trunk Assessment: Normal  Communication   Communication: No difficulties  Cognition Arousal/Alertness: Awake/alert Behavior During Therapy: WFL for tasks  assessed/performed Overall Cognitive Status: Within Functional Limits for tasks assessed                                          General Comments General comments (skin integrity, edema, etc.): Pt on 1 L with sats 92%.  Tried RA and sats 90% at lowest rest, 92% with activity, 94% post activity.  Left on RA    Exercises     Assessment/Plan    PT Assessment Patient does not need any further PT services  PT Problem List         PT Treatment Interventions      PT Goals (Current goals can be found in the Care Plan section)  Acute Rehab PT Goals Patient Stated Goal: return home PT Goal Formulation: All assessment and education complete, DC therapy    Frequency       Co-evaluation               AM-PAC PT "6 Clicks" Mobility  Outcome Measure Help needed turning from your back to your side while in a flat bed without using bedrails?: None Help needed moving from lying on your back to sitting on the side of a flat bed without using bedrails?: None Help needed moving to and from a bed to a chair (including a wheelchair)?: None Help needed standing up from a chair using your arms (e.g., wheelchair or bedside chair)?: None Help needed to walk in hospital room?: None Help needed climbing 3-5 steps with a railing? : A Little 6 Click Score: 23    End of Session   Activity Tolerance: Patient tolerated treatment well Patient left: in bed;with call bell/phone within reach Nurse Communication: Mobility status PT Visit Diagnosis: Difficulty in walking, not elsewhere classified (R26.2)    Time: 0277-4128 PT Time Calculation (min) (ACUTE ONLY): 20 min   Charges:   PT Evaluation $PT Eval Low Complexity: 1 Low          Darbie Biancardi, PT Acute Rehab [facility] [phone]   Karlton Lemon 04/12/2022, 12:44 PM

## 2022-04-12 NOTE — Progress Notes (Signed)
Heart Failure Nurse Navigator Progress Note  PCP: Glenis Smoker, MD PCP-Cardiologist: [facility] Admission Diagnosis: Acute on chronic congestive heart failure.  Admitted from: Home via EMS  Presentation:   Leslie [name] presented with shortness of breath, bilateral lower extremity edema, arrival of EMS patient was 67 % on RA, CXR showed cardiomegaly and vascular congestion with edema. R/L HC on 12/28 showed nonobstructive CAD with mild pulmonary hypertension. BP 230/110, HR 90-120, BNP 702.6, Troponin 117. IV lasix given, BIPAP was introduced, however patient was unable to tolerate due to claustrophobia.   Patient was educated on the sign and symptoms of heart failure, daily weights, when to call her doctor or go to the ED, Diet/ fluid restrictions, taking all medications as prescribed and attending all medical appointments. Patient verbalized her understanding, a hospital HF TOC appointment was scheduled for 04/30/2022.   ECHO/ LVEF: 25-30% HFpEF  Clinical Course:  Past Medical History:  Diagnosis Date   Adenomatous polyp    Arthritis    "knees, legs, fingers" (07/30/2016)   Asthma    Bursitis of left shoulder    "just finished PT" (07/30/2016)   Chest pain    a. 2003 Abnl stress test-->Cath: nonobs CAD.   Chronic bronchitis (HCC)    Chronic kidney disease    stage 3   GERD (gastroesophageal reflux disease)    barrett's esophagus- Dr. Cristina Gong   History of hiatal hernia    Hyperlipidemia    Hypertension    Hypothyroidism    Migraines    "sporatic; at least a few/year" (07/30/2016)   Mitral regurgitation    a. 07/2016 Echo: mild to mod MR;  b. 07/2016 TEE: mild MR.   Moderate aortic insufficiency    a. 07/2016 Echo: EF 55-60%, mod AI;  b. 07/2016 TEE: EF 50-55%, mod AI.   Obesity    s/p lap band surgery 6/10   OSA on CPAP    PAF (paroxysmal atrial fibrillation) ([place])    a. 07/2016 TEE/DCCV: EF 50-55%, mild MR, mod AI, mild to mod TR, neg bubble study-->successful DCCV x1  w/ 120J.   PONV (postoperative nausea and vomiting)    Vitamin D deficiency      Social History   Socioeconomic History   Marital status: Divorced    Spouse name: Not on file   Number of children: Not on file   Years of education: Not on file   Highest education level: Not on file  Occupational History   Not on file  Tobacco Use   Smoking status: Former   Smokeless tobacco: Never   Tobacco comments:    07/30/2016 "social smoker only; in college"  Vaping Use   Vaping Use: Never used  Substance and Sexual Activity   Alcohol use: Yes    Alcohol/week: 2.0 standard drinks of alcohol    Types: 1 Glasses of wine, 1 Standard drinks or equivalent per week    Comment: 07/30/2016 "couple drinks/month"   Drug use: No   Sexual activity: Not Currently  Other Topics Concern   Not on file  Social History Narrative   Not on file   Social Determinants of Health   Financial Resource Strain: Not on file  Food Insecurity: No Food Insecurity (04/09/2022)   Hunger Vital Sign    Worried About Running Out of Food in the Last Year: Never true    Ran Out of Food in the Last Year: Never true  Transportation Needs: No Transportation Needs (04/09/2022)   PRAPARE - Transportation  Lack of Transportation (Medical): No    Lack of Transportation (Non-Medical): No  Physical Activity: Not on file  Stress: Not on file  Social Connections: Not on file   Education Assessment and Provision:  Detailed education and instructions provided on heart failure disease management including the following:  Signs and symptoms of Heart Failure When to call the physician Importance of daily weights Low sodium diet Fluid restriction Medication management Anticipated future follow-up appointments  Patient education given on each of the above topics.  Patient acknowledges understanding via teach back method and acceptance of all instructions.  Education Materials:  "Living Better With Heart Failure" Booklet,  HF zone tool, & Daily Weight Tracker Tool.  Patient has scale at home: Yes Patient has pill box at home: NA    High Risk Criteria for Readmission and/or Poor Patient Outcomes: Heart failure hospital admissions (last 6 months): 0  No Show rate: 0 Difficult social situation: No Demonstrates medication adherence: Yes Primary Language: English Literacy level: Reading, writing, and comprehension  Barriers of Care:   Continued HF education Diet/ fluid/ daily weights  Considerations/Referrals:   Referral made to Heart Failure Pharmacist Stewardship: Yes Referral made to Heart Failure CSW/NCM TOC: No Referral made to Heart & Vascular TOC clinic: Yes, 04/30/2022  Items for Follow-up on DC/TOC: Continued HF education Diet/ fluid/ daily weights   Earnestine Leys, BSN, RN Heart Failure Transport planner Only

## 2022-04-12 NOTE — Discharge Summary (Signed)
Physician Discharge Summary   Patient: Leslie [name] MRN: [id number] DOB: 06-28-1945  Admit date:     04/09/2022  Discharge date: 04/12/22  Discharge Physician: Edwin Dada   PCP: Glenis Smoker, MD     Recommendations at discharge:  Follow up with PCP Dr. Lindell Noe in 1 week for CHF   Follow up with Cardiology in 1-2 weeks Dr. 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 F with persAF on flecainide and Eliquis, dCHF, CKD IIIa baseline 1.1, HTN, and MO who presented with several weeks progression of shortness of breath.       * Acute on chronic combined systolic and diastolic CHF (congestive heart failure) ([place]) Admitted on Lasix, Cardiology consulted, Echo showed EF newly down to 25-30%.  Started on losartan, Entresto and had hypotension so backed off on GDMT.  Had L/RHC and that showed mild multivessel disease, no high grade stenoses, no signifant volume overload or pHTN.  Ambulated on room air, BP and HR controlled.   Myocardial injury from CHF vs Takotsubo Myocardial infarction and ischemia ruled out .   Persistent atrial fibrillation (HCC) On Eliquis. Flecainide and verapamil transitioned to amiodarone and metoprolol     Asthma, chronic No active flare  Chronic kidney disease, stage IIIa Baseline 1.1.  Hypothyroidism On levothyroxine  Morbid obesity ([place]) BMI 43  Pure  hypercholesterolemia On Lipitor                 The [facility] Controlled Substances Registry was reviewed for this patient prior to discharge.  Consultants: Careiology Procedures performed: Echocardiogram, Right and left heart cath  Disposition: Home Diet recommendation:  Discharge Diet Orders (From admission, onward)     Start     Ordered   04/12/22 0000  Diet - low sodium heart healthy        04/12/22 1146             DISCHARGE MEDICATION: Allergies as of 04/12/2022       Reactions   Clindamycin Hcl    Lip and face swelling   Augmentin [amoxicillin-pot Clavulanate] Diarrhea   Did it involve swelling of the face/tongue/throat, SOB, or low BP? No Did it involve sudden or severe rash/hives, skin peeling, or any reaction on the inside of your mouth or nose? No Did you need to seek medical attention at a hospital or doctor's office? No When did it last happen?      10 + years If all above answers are "NO", may proceed with cephalosporin use.   Codeine Nausea Only, Other (See Comments)   Hyperactivity   Demerol [meperidine] Nausea And Vomiting   Sulfa Antibiotics Rash        Medication List     STOP taking these medications    flecainide 50 MG tablet Commonly known as: TAMBOCOR   verapamil 180 MG CR tablet Commonly known as: CALAN-SR       TAKE these medications  acetaminophen 650 MG CR tablet Commonly known as: TYLENOL Take 1,300 mg by mouth every 8 (eight) hours as needed for pain.   ALIGN PREBIOTIC-PROBIOTIC PO Take 2 capsules by mouth daily. Gummy   amiodarone 200 MG tablet Commonly known as: PACERONE Take 1 tablet (200 mg total) by mouth daily. Start taking on: April 13, 2022   atorvastatin 40 MG tablet Commonly known as: LIPITOR Take 1 tablet (40 mg total) by mouth daily. Start taking on: April 13, 2022 What changed:  medication strength how much to take   Centrum MultiGummies Chew Chew 1 tablet by mouth  daily.   cholecalciferol 1000 units tablet Commonly known as: VITAMIN D Take 1,000 Units by mouth daily.   diclofenac Sodium 1 % Gel Commonly known as: VOLTAREN Apply 1 application topically 2 (two) times daily.   Eliquis 5 MG Tabs tablet Generic drug: apixaban TAKE 1 TABLET BY MOUTH TWICE DAILY   fexofenadine 180 MG tablet Commonly known as: ALLEGRA Take 180 mg by mouth daily.   fluticasone 44 MCG/ACT inhaler Commonly known as: Flovent HFA Inhale 2 puffs into the lungs 2 (two) times daily. What changed:  when to take this additional instructions   fluticasone 50 MCG/ACT nasal spray Commonly known as: FLONASE Place 2 sprays into both nostrils at bedtime.   furosemide 40 MG tablet Commonly known as: LASIX Take 1 tablet (40 mg total) by mouth daily. Start taking on: April 13, 2022 What changed:  medication strength how much to take   levothyroxine 50 MCG tablet Commonly known as: SYNTHROID Take 50 mcg by mouth daily before breakfast.   losartan 25 MG tablet Commonly known as: COZAAR Take 1 tablet (25 mg total) by mouth daily. Start taking on: April 13, 2022   magnesium oxide 400 (240 Mg) MG tablet Commonly known as: MAG-OX Take 1 tablet (400 mg total) by mouth daily.   metoprolol succinate 100 MG 24 hr tablet Commonly known as: TOPROL-XL Take 100 mg (one tablet) in the morning and 50 mg (half a tablet) in the evening.   metroNIDAZOLE 0.75 % cream Commonly known as: METROCREAM Apply 1 application topically daily.   mometasone 0.1 % cream Commonly known as: ELOCON Apply 1 application topically 2 (two) times daily as needed (skin irritation).   omeprazole 20 MG capsule Commonly known as: PRILOSEC Take 40 mg by mouth daily before breakfast.   OSTEO BI-FLEX REGULAR STRENGTH PO Take 1 tablet by mouth daily.   senna 8.6 MG tablet Commonly known as: SENOKOT Take 1 tablet by mouth every evening.        Follow-up Information     Glenis Smoker, MD Follow up.   Specialty: Family Medicine Why: The office will call patient. Contact information: [place] [address] [phone]         [facility] Follow up.   Why: They will contact you for an appointment Contact information: [place] [address] [phone]        [place]. Go in 18 day(s).   Specialty: Cardiology Why: Hospital follow up 04/30/2022 PLEASE bring a current medicatio0n list to appointment FREE valet parking, Entrance C, off [employer] information: [address] [id number] [place] [phone]                Discharge Instructions     Diet - low sodium heart healthy   Complete by: As directed  Discharge instructions   Complete by: As directed    **IMPORTANT DISCHARGE INSTRUCTIONS**   From Dr. Loleta Books: You were admitted for heart failure symptoms Here, we did an echocardiogram (ultrasound of the heart) and found that your "ejection fraction" was low  This means the heart has gotten weaker Thankfully, the heart cath was done that showed no blockages leading to the heart We are optimistic that your heart squeeze will get stronger with time  For now: STOP flecainide START amiodarone in it's place  Continue apixaban/Eliquis  STOP verapamil (heart rate slowing) START losartan (heart protective)  Continue metoprolol (heart protective and heart rate slowing) Continue furosemide/Lasix but INCREASE dose  Continue your atorvastatin but INCREASE dose  The heart doctors will arrange follow up for you in 1 to 2 weeks  Check your blood pressure at home once daily and call your docotr (either your heart doctor or Dr, Lindell Noe) if your blood pressure is LESS than 95 mmHg for the top number   Increase activity slowly   Complete by: As directed         Discharge Exam: Filed Weights   04/10/22 0054 04/10/22 0113 04/12/22 0450  Weight: 120.9 kg 120.9 kg 120.1 kg    General: Pt is alert, awake, not in acute distress Cardiovascular: RRR, nl S1-S2, no murmurs appreciated.   No LE edema.   Respiratory: Normal respiratory rate and rhythm.  CTAB without rales or wheezes. Abdominal: Abdomen soft and non-tender.  No distension or HSM.   Neuro/Psych: Strength symmetric in upper and lower extremities.  Judgment and insight appear normal.   Condition at discharge: good  The results of significant diagnostics from this hospitalization (including imaging, microbiology, ancillary and laboratory) are listed below for reference.   Imaging Studies: CARDIAC CATHETERIZATION  Result Date: 76/28/3151   LV end diastolic pressure is mildly elevated.   Hemodynamic findings consistent with mild pulmonary hypertension.   There is no aortic valve stenosis.   On 4L [place]: Ao sat 92%, PA sat 62%, PA pressure 48/20, mean PA pressure 32 mmHg, mean right atrial pressure 8 mmHg, mean pulmonary capillary wedge pressure 15 mmHg, cardiac output 7.09 L/min, cardiac index 3.13. Mild, nonobstructive coronary artery disease.  Mild pulmonary artery hypertension.  Nonischemic cardiomyopathy.  Continue medical therapy.   ECHOCARDIOGRAM COMPLETE  Result Date: 04/09/2022    ECHOCARDIOGRAM REPORT   Patient Name:   ROSHANA SHUFFIELD Date of Exam: 04/09/2022 Medical Rec #:  [id number]       Height:       65.5 in Accession #:    [id number]      Weight:       273.4 lb Date of Birth:  02/01/1946       BSA:          2.272 m Patient Age:    53 years        BP:           128/89 mmHg Patient Gender: F               HR:           90 bpm. Exam Location:  Inpatient Procedure: 2D Echo, Cardiac Doppler and Color Doppler Indications:    CHF - acute systolic  History:        Patient has prior history of Echocardiogram examinations, most                 recent 06/01/2020. Arrythmias:Atrial Fibrillation,  Signs/Symptoms:Shortness of Breath, Edema and Fatigue; Risk                 Factors:Hypertension, Dyslipidemia, Diabetes and Sleep Apnea.                 CKD.  Sonographer:    Eartha Inch Referring Phys: Karmen Bongo  Sonographer Comments: Patient is obese. Image acquisition challenging due to patient body habitus and Image acquisition challenging due to respiratory motion. IMPRESSIONS  1. Left ventricular ejection fraction, by estimation, is 25 to 30%. The left ventricle has severely decreased function. The left ventricle demonstrates regional wall motion abnormalities (see scoring diagram/findings for description). There is mild left  ventricular hypertrophy. Left ventricular diastolic parameters are indeterminate.  2. Right ventricular systolic function is normal. The right ventricular size is normal. Tricuspid regurgitation signal is inadequate for assessing PA pressure.  3. A small pericardial effusion is present. The pericardial effusion is anterior to the right ventricle.  4. The mitral valve is normal in structure. Moderate mitral valve regurgitation. No evidence of mitral stenosis.  5. The aortic valve is tricuspid. There is mild calcification of the aortic valve. Aortic valve regurgitation is moderate - incompletely interrogated. No aortic stenosis is present.  6. The inferior vena cava is dilated in size with >50% respiratory variability, suggesting right atrial pressure of 8 mmHg. FINDINGS  Left Ventricle: Left ventricular ejection fraction, by estimation, is 25 to 30%. The left ventricle has severely decreased function. The left ventricle demonstrates regional wall motion abnormalities. The left ventricular internal cavity size was normal  in size. There is mild left ventricular hypertrophy. Left ventricular diastolic parameters are indeterminate.  LV Wall Scoring: The mid and distal anterior septum and mid inferoseptal segment are akinetic. The entire anterior wall,  antero-lateral wall, mid and distal lateral wall, and entire inferior wall are hypokinetic. The basal anteroseptal segment, basal inferolateral segment, and basal inferoseptal segment are normal. Right Ventricle: The right ventricular size is normal. No increase in right ventricular wall thickness. Right ventricular systolic function is normal. Tricuspid regurgitation signal is inadequate for assessing PA pressure. Left Atrium: Left atrial size was normal in size. Right Atrium: Right atrial size was normal in size. Pericardium: A small pericardial effusion is present. The pericardial effusion is anterior to the right ventricle. Mitral Valve: The mitral valve is normal in structure. Moderate mitral valve regurgitation. No evidence of mitral valve stenosis. Tricuspid Valve: The tricuspid valve is normal in structure. Tricuspid valve regurgitation is not demonstrated. No evidence of tricuspid stenosis. Aortic Valve: The aortic valve is tricuspid. There is mild calcification of the aortic valve. Aortic valve regurgitation is moderate. Aortic regurgitation PHT measures 255 msec. No aortic stenosis is present. Pulmonic Valve: The pulmonic valve was grossly normal. Pulmonic valve regurgitation is trivial. No evidence of pulmonic stenosis. Aorta: The aortic root is normal in size and structure. Venous: The inferior vena cava is dilated in size with greater than 50% respiratory variability, suggesting right atrial pressure of 8 mmHg. IAS/Shunts: The atrial septum is grossly normal.  LEFT VENTRICLE PLAX 2D LVIDd:         5.20 cm      Diastology LVIDs:         3.70 cm      LV e' medial:    3.09 cm/s LV PW:         1.20 cm      LV E/e' medial:  40.1 LV IVS:        1.10  cm      LV e' lateral:   4.74 cm/s LVOT diam:     2.00 cm      LV E/e' lateral: 26.2 LV SV:         70 LV SV Index:   31 LVOT Area:     3.14 cm  LV Volumes (MOD) LV vol d, MOD A2C: 94.5 ml LV vol d, MOD A4C: 148.0 ml LV vol s, MOD A2C: 58.6 ml LV vol s, MOD A4C:  80.8 ml LV SV MOD A2C:     35.9 ml LV SV MOD A4C:     148.0 ml LV SV MOD BP:      54.5 ml RIGHT VENTRICLE            IVC RV S prime:     9.46 cm/s  IVC diam: 2.20 cm TAPSE (M-mode): 2.3 cm LEFT ATRIUM             Index        RIGHT ATRIUM           Index LA diam:        4.90 cm 2.16 cm/m   RA Area:     16.50 cm LA Vol (A2C):   55.4 ml 24.38 ml/m  RA Volume:   35.30 ml  15.54 ml/m LA Vol (A4C):   69.5 ml 30.59 ml/m LA Biplane Vol: 62.8 ml 27.64 ml/m  AORTIC VALVE LVOT Vmax:   118.00 cm/s LVOT Vmean:  87.700 cm/s LVOT VTI:    0.222 m AI PHT:      255 msec  AORTA Ao Root diam: 2.70 cm Ao Asc diam:  3.40 cm MITRAL VALVE MV Area (PHT): 4.99 cm     SHUNTS MV Decel Time: 152 msec     Systemic VTI:  0.22 m MR Peak grad: 71.2 mmHg     Systemic Diam: 2.00 cm MR Mean grad: 55.0 mmHg MR Vmax:      422.00 cm/s MR Vmean:     353.0 cm/s MV E velocity: 124.00 cm/s MV A velocity: 90.00 cm/s MV E/A ratio:  1.38 Cherlynn Kaiser MD Electronically signed by Cherlynn Kaiser MD Signature Date/Time: 04/09/2022/5:48:16 PM    Final    DG Chest 2 View  Result Date: 04/09/2022 CLINICAL DATA:  Shortness of breath. EXAM: CHEST - 2 VIEW COMPARISON:  Chest radiograph dated 05/30/2020. FINDINGS: Cardiomegaly with vascular congestion and edema. Pneumonia is not excluded clinical correlation is recommended. A small right pleural effusion may be present. No pneumothorax. No acute osseous pathology. IMPRESSION: Cardiomegaly with vascular congestion and edema. Pneumonia is not excluded. Electronically Signed   By: Anner Crete M.D.   On: 04/09/2022 02:45   MM DIAG BREAST TOMO UNI LEFT  Result Date: 03/20/2022 CLINICAL DATA:  76 year old female recalled from screening mammogram dated 02/28/2022 for a possible left breast mass. EXAM: DIGITAL DIAGNOSTIC UNILATERAL LEFT MAMMOGRAM WITH TOMOSYNTHESIS; ULTRASOUND LEFT BREAST LIMITED TECHNIQUE: Left digital diagnostic mammography and breast tomosynthesis was performed.; Targeted ultrasound  examination of the left breast was performed. COMPARISON:  Previous exam(s). ACR Breast Density Category b: There are scattered areas of fibroglandular density. FINDINGS: There is a persistent oval, circumscribed equal density mass in the subareolar left breast with associated dilated ducts. Targeted ultrasound is performed, showing an oval, circumscribed anechoic cyst at the 12 o'clock position 1 cm from the nipple. It measures 1.5 x 1.0 x 1.3 cm. There is no internal vascularity. This correlates with the mammographic finding. Adjacent duct ectasia  is noted. IMPRESSION: Benign left breast fibrocystic changes and duct ectasia. No further follow-up required. RECOMMENDATION: Screening mammogram in one year.(Code:SM-B-01Y) I have discussed the findings and recommendations with the patient. If applicable, a reminder letter will be sent to the patient regarding the next appointment. BI-RADS CATEGORY  2: Benign. Electronically Signed   By: Kristopher Oppenheim M.D.   On: 03/20/2022 15:54  US BREAST LTD UNI LEFT INC AXILLA  Result Date: 03/20/2022 CLINICAL DATA:  76 year old female recalled from screening mammogram dated 02/28/2022 for a possible left breast mass. EXAM: DIGITAL DIAGNOSTIC UNILATERAL LEFT MAMMOGRAM WITH TOMOSYNTHESIS; ULTRASOUND LEFT BREAST LIMITED TECHNIQUE: Left digital diagnostic mammography and breast tomosynthesis was performed.; Targeted ultrasound examination of the left breast was performed. COMPARISON:  Previous exam(s). ACR Breast Density Category b: There are scattered areas of fibroglandular density. FINDINGS: There is a persistent oval, circumscribed equal density mass in the subareolar left breast with associated dilated ducts. Targeted ultrasound is performed, showing an oval, circumscribed anechoic cyst at the 12 o'clock position 1 cm from the nipple. It measures 1.5 x 1.0 x 1.3 cm. There is no internal vascularity. This correlates with the mammographic finding. Adjacent duct ectasia is noted.  IMPRESSION: Benign left breast fibrocystic changes and duct ectasia. No further follow-up required. RECOMMENDATION: Screening mammogram in one year.(Code:SM-B-01Y) I have discussed the findings and recommendations with the patient. If applicable, a reminder letter will be sent to the patient regarding the next appointment. BI-RADS CATEGORY  2: Benign. Electronically Signed   By: Kristopher Oppenheim M.D.   On: 03/20/2022 15:54   Microbiology: Results for orders placed or performed during the hospital encounter of 05/30/20  SARS CORONAVIRUS 2 (TAT 6-24 HRS) Nasopharyngeal Nasopharyngeal Swab     Status: None   Collection Time: 05/30/20 10:10 PM   Specimen: Nasopharyngeal Swab  Result Value Ref Range Status   SARS Coronavirus 2 NEGATIVE NEGATIVE Final    Comment: (NOTE) SARS-CoV-2 target nucleic acids are NOT DETECTED.  The SARS-CoV-2 RNA is generally detectable in upper and lower respiratory specimens during the acute phase of infection. Negative results do not preclude SARS-CoV-2 infection, do not rule out co-infections with other pathogens, and should not be used as the sole basis for treatment or other patient management decisions. Negative results must be combined with clinical observations, patient history, and epidemiological information. The expected result is Negative.  Fact Sheet for Patients: SugarRoll.be  Fact Sheet for Healthcare Providers: https://www.woods-mathews.com/  This test is not yet approved or cleared by the [place] FDA and  has been authorized for detection and/or diagnosis of SARS-CoV-2 by FDA under an Emergency Use Authorization (EUA). This EUA will remain  in effect (meaning this test can be used) for the duration of the COVID-19 declaration under Se ction 564(b)(1) of the Act, 21 U.S.C. section 360bbb-3(b)(1), unless the authorization is terminated or revoked sooner.  Performed at [facility], [place]  [address], [place], [place] [address]     Labs: CBC: Recent Labs  Lab 04/09/22 0255 04/10/22 0231 04/11/22 0332 04/11/22 1418 04/11/22 1425 04/12/22 0539  WBC 21.9* 13.9* 9.0  --   --  8.6  HGB 11.8* 11.4* 10.4* 11.2* 11.2*  11.2* 10.5*  HCT 39.7 36.3 33.8* 33.0* 33.0*  33.0* 34.8*  MCV 82.7 79.8* 80.9  --   --  81.3  PLT 372 323 255  --   --  829   Basic Metabolic Panel: Recent Labs  Lab 04/09/22 0255 04/10/22 0231 04/11/22 0332 04/11/22 1418  04/11/22 1425 04/12/22 0539  NA 138 139 139 139 139  139 141  K 4.1 3.9 3.8 3.9 3.8  3.8 4.1  CL 106 100 102  --   --  104  CO2 21* 26 23  --   --  25  GLUCOSE 163* 123* 79  --   --  97  BUN '20 18 23  '$ --   --  22  CREATININE 0.96 1.05* 1.15*  --   --  1.06*  CALCIUM 8.4* 8.4* 8.0*  --   --  8.3*   Liver Function Tests: Recent Labs  Lab 04/09/22 0255 04/11/22 0332  AST 20 16  ALT 14 12  ALKPHOS 99 78  BILITOT 0.9 0.7  PROT 6.8 5.9*  ALBUMIN 3.4* 2.8*   CBG: No results for input(s): "GLUCAP" in the last 168 hours.  Discharge time spent: approximately 35 minutes spent on discharge counseling, evaluation of patient on day of discharge, and coordination of discharge planning with nursing, social work, pharmacy and case management  Signed: Edwin Dada, MD [facility] 04/12/2022

## 2022-04-12 NOTE — Progress Notes (Signed)
Cardiology Progress Note  Patient ID: Leslie [name] MRN: [id number] DOB: 06-19-1945 Date of Encounter: 04/12/2022  Primary Cardiologist: Sanda Klein, MD  Subjective   Chief Complaint: SOB  HPI: Left heart cath with no significant CAD.  Euvolemic on exam.  ROS:  All other ROS reviewed and negative. Pertinent positives noted in the HPI.     Inpatient Medications  Scheduled Meds:  amiodarone  200 mg Oral Daily   apixaban  5 mg Oral BID   aspirin  81 mg Oral Daily   atorvastatin  40 mg Oral Daily   budesonide (PULMICORT) nebulizer solution  0.25 mg Nebulization BID   docusate sodium  100 mg Oral BID   fluticasone  2 spray Each Nare QHS   furosemide  40 mg Oral Daily   levothyroxine  50 mcg Oral QAC breakfast   loratadine  10 mg Oral Daily   losartan  25 mg Oral Daily   magnesium oxide  400 mg Oral Daily   metoprolol succinate  100 mg Oral q AM   pantoprazole  40 mg Oral Daily   sodium chloride flush  3 mL Intravenous Q12H   sodium chloride flush  3 mL Intravenous Q12H   sodium chloride flush  3 mL Intravenous Q12H   Continuous Infusions:  sodium chloride     PRN Meds: sodium chloride, acetaminophen, bisacodyl, ondansetron **OR** ondansetron (ZOFRAN) IV, oxyCODONE, polyethylene glycol, sodium chloride flush, traZODone   Vital Signs   Vitals:   04/11/22 1911 04/11/22 1938 04/12/22 0450 04/12/22 0847  BP:  (!) 103/59 (!) 139/57 114/61  Pulse:  76 80   Resp:  15 17   Temp:  99.1 F (37.3 C) 97.9 F (36.6 C)   TempSrc:  Oral Oral   SpO2: 98% 95% 98% 97%  Weight:   120.1 kg   Height:        Intake/Output Summary (Last 24 hours) at 04/12/2022 0946 Last data filed at 04/12/2022 0321 Gross per 24 hour  Intake 607.73 ml  Output 1000 ml  Net -392.27 ml      04/12/2022    4:50 AM 04/10/2022    1:13 AM 04/10/2022   12:54 AM  Last 3 Weights  Weight (lbs) 264 lb 11.2 oz 266 lb 8.6 oz 266 lb 8.6 oz  Weight (kg) 120.067 kg 120.9 kg 120.9 kg      Telemetry   Overnight telemetry shows sinus rhythm 70s, which I personally reviewed.   ECG  The most recent ECG shows sinus tachycardia heart rate 113, anteroseptal infarct, which I personally reviewed.   Physical Exam   Vitals:   04/11/22 1911 04/11/22 1938 04/12/22 0450 04/12/22 0847  BP:  (!) 103/59 (!) 139/57 114/61  Pulse:  76 80   Resp:  15 17   Temp:  99.1 F (37.3 C) 97.9 F (36.6 C)   TempSrc:  Oral Oral   SpO2: 98% 95% 98% 97%  Weight:   120.1 kg   Height:        Intake/Output Summary (Last 24 hours) at 04/12/2022 0946 Last data filed at 04/12/2022 0321 Gross per 24 hour  Intake 607.73 ml  Output 1000 ml  Net -392.27 ml       04/12/2022    4:50 AM 04/10/2022    1:13 AM 04/10/2022   12:54 AM  Last 3 Weights  Weight (lbs) 264 lb 11.2 oz 266 lb 8.6 oz 266 lb 8.6 oz  Weight (kg) 120.067 kg 120.9 kg 120.9 kg  Body mass index is 42.72 kg/m.  General: Well nourished, well developed, in no acute distress Head: Atraumatic, normal size  Eyes: PEERLA, EOMI  Neck: Supple, no JVD Endocrine: No thryomegaly Cardiac: Normal S1, S2; RRR; no murmurs, rubs, or gallops Lungs: Clear to auscultation bilaterally, no wheezing, rhonchi or rales  Abd: Soft, nontender, no hepatomegaly  Ext: No edema, pulses 2+, right radial cath site clean and dry 2+ pulse Musculoskeletal: No deformities, BUE and BLE strength normal and equal Skin: Warm and dry, no rashes   Neuro: Alert and oriented to person, place, time, and situation, CNII-XII grossly intact, no focal deficits  Psych: Normal mood and affect   Labs  High Sensitivity Troponin:   Recent Labs  Lab 04/09/22 0255 04/09/22 0430 04/09/22 1453 04/09/22 1653  TROPONINIHS 117* 292* 876* 716*     Cardiac EnzymesNo results for input(s): "TROPONINI" in the last 168 hours. No results for input(s): "TROPIPOC" in the last 168 hours.  Chemistry Recent Labs  Lab 04/09/22 0255 04/10/22 0231 04/11/22 0332 04/11/22 1418 04/11/22 1425  04/12/22 0539  NA 138 139 139 139 139  139 141  K 4.1 3.9 3.8 3.9 3.8  3.8 4.1  CL 106 100 102  --   --  104  CO2 21* 26 23  --   --  25  GLUCOSE 163* 123* 79  --   --  97  BUN '20 18 23  '$ --   --  22  CREATININE 0.96 1.05* 1.15*  --   --  1.06*  CALCIUM 8.4* 8.4* 8.0*  --   --  8.3*  PROT 6.8  --  5.9*  --   --   --   ALBUMIN 3.4*  --  2.8*  --   --   --   AST 20  --  16  --   --   --   ALT 14  --  12  --   --   --   ALKPHOS 99  --  78  --   --   --   BILITOT 0.9  --  0.7  --   --   --   GFRNONAA >60 55* 50*  --   --  55*  ANIONGAP '11 13 14  '$ --   --  12    Hematology Recent Labs  Lab 04/10/22 0231 04/11/22 0332 04/11/22 1418 04/11/22 1425 04/12/22 0539  WBC 13.9* 9.0  --   --  8.6  RBC 4.55 4.18  --   --  4.28  HGB 11.4* 10.4* 11.2* 11.2*  11.2* 10.5*  HCT 36.3 33.8* 33.0* 33.0*  33.0* 34.8*  MCV 79.8* 80.9  --   --  81.3  MCH 25.1* 24.9*  --   --  24.5*  MCHC 31.4 30.8  --   --  30.2  RDW 15.7* 15.8*  --   --  15.8*  PLT 323 255  --   --  302   BNP Recent Labs  Lab 04/09/22 0255  BNP 702.6*    DDimer No results for input(s): "DDIMER" in the last 168 hours.   Radiology  CARDIAC CATHETERIZATION  Result Date: 25/36/6440   LV end diastolic pressure is mildly elevated.   Hemodynamic findings consistent with mild pulmonary hypertension.   There is no aortic valve stenosis.   On 4L [place]: Ao sat 92%, PA sat 62%, PA pressure 48/20, mean PA pressure 32 mmHg, mean right atrial pressure 8 mmHg, mean pulmonary capillary wedge  pressure 15 mmHg, cardiac output 7.09 L/min, cardiac index 3.13. Mild, nonobstructive coronary artery disease.  Mild pulmonary artery hypertension.  Nonischemic cardiomyopathy.  Continue medical therapy.    Cardiac Studies  TTE 04/09/2022  1. Left ventricular ejection fraction, by estimation, is 25 to 30%. The  left ventricle has severely decreased function. The left ventricle  demonstrates regional wall motion abnormalities (see scoring   diagram/findings for description). There is mild left   ventricular hypertrophy. Left ventricular diastolic parameters are  indeterminate.   2. Right ventricular systolic function is normal. The right ventricular  size is normal. Tricuspid regurgitation signal is inadequate for assessing  PA pressure.   3. A small pericardial effusion is present. The pericardial effusion is  anterior to the right ventricle.   4. The mitral valve is normal in structure. Moderate mitral valve  regurgitation. No evidence of mitral stenosis.   5. The aortic valve is tricuspid. There is mild calcification of the  aortic valve. Aortic valve regurgitation is moderate - incompletely  interrogated. No aortic stenosis is present.   6. The inferior vena cava is dilated in size with >50% respiratory  variability, suggesting right atrial pressure of 8 mmHg.   LHC 00/93/8182   LV end diastolic pressure is mildly elevated.   Hemodynamic findings consistent with mild pulmonary hypertension.   There is no aortic valve stenosis.   On 4L [place]: Ao sat 92%, PA sat 62%, PA pressure 48/20, mean PA pressure 32 mmHg, mean right atrial pressure 8 mmHg, mean pulmonary capillary wedge pressure 15 mmHg, cardiac output 7.09 L/min, cardiac index 3.13.  Patient Profile  77 year old female with history of persistent atrial fibrillation on flecainide, HFpEF, GERD, hypertension, CKD stage III who was admitted on 04/09/2022 for hypertensive crisis and acute systolic heart failure.    Assessment & Plan   # Acute systolic heart failure, EF 25-30% # [place] -Left heart cath with no significant CAD.  This is likely a stress-induced cardiomyopathy due to hypertensive crisis. -Continue metoprolol succinate 100 mg daily in the morning and 50 mg in the evening -Continue losartan 25 mg daily. -Continue with Lasix 40 mg p.o. daily. -Would just recommend to go home on this regimen.  I do not see a need for aggressive CHF regimen in the setting  of a stress-induced cardiomyopathy.  # Persistent atrial fibrillation -Restart Eliquis this morning. -Would continue amiodarone 200 mg daily.  Had brief A-fib this morning.  She cannot be on flecainide given reduced EF.  If EF improves she probably will be able to go back on flecainide.  # Elevated troponin, demand -Secondary to stress-induced cardiomyopathy. -No need for aspirin.  Okay to continue statin.  [place] will sign off.   Medication Recommendations: Continue medicines as above.  Recommend discontinuation of diltiazem as well as other blood pressure medications.  She should go home on metoprolol and losartan.  Lasix 40 mg daily.  Amiodarone 200 mg daily.  Eliquis.  Statin.  No need for aspirin. Other recommendations (labs, testing, etc): None. Follow up as an outpatient: We will arrange outpatient follow-up in 1 to 2 weeks.  For questions or updates, please contact [place] Please consult [email or web address] for contact info under        Signed, [place] T. Audie Box, MD, [place]  04/12/2022 9:46 AM

## 2022-04-12 NOTE — TOC Transition Note (Signed)
Transition of Care [facility]) - CM/SW Discharge Note   Patient Details  Name: Leslie [name] MRN: [id number] Date of Birth: 09/13/1945  Transition of Care [facility]) CM/SW Contact:  Zenon Mayo, RN Phone Number: 04/12/2022, 12:36 PM   Clinical Narrative:    Patient is for dc today, she states her brother will transport her home today, she states she does not want [place].  She states she has a scale and a bp cuff at home, she eats a low sodium diet.  She has no other needs.   Final next level of care: Home/Self Care Barriers to Discharge: No Barriers Identified   Patient Goals and CMS Choice   Choice offered to / list presented to : NA  Discharge Placement                         Discharge Plan and Services Additional resources added to the After Visit Summary for                  DME Arranged: N/A         HH Arranged: NA          Social Determinants of Health (SDOH) Interventions SDOH Screenings   Food Insecurity: No Food Insecurity (04/09/2022)  Housing: Low Risk  (04/09/2022)  Transportation Needs: No Transportation Needs (04/09/2022)  Utilities: Not At Risk (04/09/2022)  Alcohol Screen: Low Risk  (04/12/2022)  Depression (PHQ2-9): Low Risk  (02/22/2022)  Financial Resource Strain: Medium Risk (04/12/2022)  Tobacco Use: Medium Risk (04/12/2022)     Readmission Risk Interventions     No data to display

## 2022-04-12 NOTE — Telephone Encounter (Signed)
Pt discharged today with new CM.  EF 25%  her verapamil and flecainide were stopped.  With change in EF.  After arrival home BP 211/110.  HR 118.   In hospital BP was lower.    I asked her to take her evening dose of BB 50 mg and if BP still >150/95 in 1.5 to 2 hours to take another 50 mg.  She will call on call MD anyway, she is anxious.  It may be with stopping verapamil she will need more BP meds.  She is on losartan as well   she is not SOB like on admit.  She is on amiodarone now.

## 2022-04-13 LAB — LIPOPROTEIN A (LPA): Lipoprotein (a): 155.1 nmol/L — ABNORMAL HIGH (ref <75.0)

## 2022-04-14 DIAGNOSIS — Z6826 Body mass index (BMI) 26.0-26.9, adult: Secondary | ICD-10-CM | POA: Diagnosis not present

## 2022-04-14 DIAGNOSIS — I509 Heart failure, unspecified: Secondary | ICD-10-CM | POA: Diagnosis not present

## 2022-04-18 ENCOUNTER — Encounter: Payer: Self-pay | Admitting: Nurse Practitioner

## 2022-04-18 ENCOUNTER — Ambulatory Visit: Payer: PPO | Attending: Nurse Practitioner | Admitting: Nurse Practitioner

## 2022-04-18 VITALS — BP 114/72 | HR 75 | Ht 66.0 in | Wt 262.0 lb

## 2022-04-18 DIAGNOSIS — I340 Nonrheumatic mitral (valve) insufficiency: Secondary | ICD-10-CM | POA: Diagnosis not present

## 2022-04-18 DIAGNOSIS — I4819 Other persistent atrial fibrillation: Secondary | ICD-10-CM | POA: Diagnosis not present

## 2022-04-18 DIAGNOSIS — E039 Hypothyroidism, unspecified: Secondary | ICD-10-CM

## 2022-04-18 DIAGNOSIS — I5032 Chronic diastolic (congestive) heart failure: Secondary | ICD-10-CM

## 2022-04-18 DIAGNOSIS — N1831 Chronic kidney disease, stage 3a: Secondary | ICD-10-CM

## 2022-04-18 DIAGNOSIS — I21A1 Myocardial infarction type 2: Secondary | ICD-10-CM

## 2022-04-18 DIAGNOSIS — G4733 Obstructive sleep apnea (adult) (pediatric): Secondary | ICD-10-CM

## 2022-04-18 DIAGNOSIS — E782 Mixed hyperlipidemia: Secondary | ICD-10-CM

## 2022-04-18 DIAGNOSIS — I5181 Takotsubo syndrome: Secondary | ICD-10-CM

## 2022-04-18 DIAGNOSIS — I351 Nonrheumatic aortic (valve) insufficiency: Secondary | ICD-10-CM

## 2022-04-18 DIAGNOSIS — I10 Essential (primary) hypertension: Secondary | ICD-10-CM

## 2022-04-18 MED ORDER — LOSARTAN POTASSIUM 25 MG PO TABS: 25.0000 mg | ORAL_TABLET | Freq: Every day | ORAL | 3 refills | Status: DC

## 2022-04-18 MED ORDER — AMIODARONE HCL 200 MG PO TABS: 200.0000 mg | ORAL_TABLET | Freq: Every day | ORAL | 3 refills | Status: DC

## 2022-04-18 MED ORDER — ATORVASTATIN CALCIUM 40 MG PO TABS: 40.0000 mg | ORAL_TABLET | Freq: Every day | ORAL | 3 refills | Status: DC

## 2022-04-18 MED ORDER — FUROSEMIDE 40 MG PO TABS: 40.0000 mg | ORAL_TABLET | Freq: Every day | ORAL | 3 refills | Status: DC

## 2022-04-18 MED ORDER — METOPROLOL SUCCINATE ER 100 MG PO TB24: ORAL_TABLET | ORAL | 3 refills | Status: DC

## 2022-04-18 MED ORDER — APIXABAN 5 MG PO TABS: 5.0000 mg | ORAL_TABLET | Freq: Two times a day (BID) | ORAL | 3 refills | Status: DC

## 2022-04-18 NOTE — Patient Instructions (Signed)
Medication Instructions:  Your physician recommends that you continue on your current medications as directed. Please refer to the Current Medication list given to you today.  *If you need a refill on your cardiac medications before your next appointment, please call your pharmacy*   Lab Work: NONE ordered at this time of appointment   If you have labs (blood work) drawn today and your tests are completely normal, you will receive your results only by: [place] (if you have MyChart) OR A paper copy in the mail If you have any lab test that is abnormal or we need to change your treatment, we will call you to review the results.   Testing/Procedures: Your physician has requested that you have an echocardiogram. Echocardiography is a painless test that uses sound waves to create images of your heart. It provides your doctor with information about the size and shape of your heart and how well your heart's chambers and valves are working. This procedure takes approximately one hour. There are no restrictions for this procedure. Please do NOT wear cologne, perfume, aftershave, or lotions (deodorant is allowed). Please arrive 15 minutes prior to your appointment time.   Follow-Up: At [facility], you and your health needs are our priority.  As part of our continuing mission to provide you with exceptional heart care, we have created designated Provider Care Teams.  These Care Teams include your primary Cardiologist (physician) and Advanced Practice Providers (APPs -  Physician Assistants and Nurse Practitioners) who all work together to provide you with the care you need, when you need it.  We recommend signing up for the patient portal called "MyChart".  Sign up information is provided on this After Visit Summary.  MyChart is used to connect with patients for Virtual Visits (Telemedicine).  Patients are able to view lab/test results, encounter notes, upcoming appointments, etc.   Non-urgent messages can be sent to your provider as well.   To learn more about what you can do with MyChart, go to [email or web address].    Your next appointment:   6-8 week(s) (E. Monge) 3-5 months (Dr. Sallyanne Kuster) The format for your next appointment:   In Person  Provider:   Sanda Klein, MD  or Diona Browner, NP        Other Instructions Complete BMET in 1 week with your Primary care Physician.  Important Information About Sugar

## 2022-04-18 NOTE — Progress Notes (Addendum)
Office Visit    Patient Name: Leslie [name] Date of Encounter: 05/06/2022  Primary Care Provider:  Glenis Smoker, MD Primary Cardiologist:  Sanda Klein, MD  Chief Complaint    77 year old female with a history of  persistent atrial fibrillation, chronic diastolic heart failure, Takotsubo cardiomyopathy,Type II MI in the setting of hypertensive urgency, mitral valve regurgitation, aortic valve regurgitation, hypertension, hyperlipidemia, CKD stage IIIa, hypothyroidism, OSA, obesity, iron deficiency anemia, and migraines who presents for hospital follow-up related to acute systolic heart failure in the setting of Takotsubo/stress induced cardiomyopathy secondary to type II MI.  Past Medical History    Past Medical History:  Diagnosis Date   Adenomatous polyp    Arthritis    "knees, legs, fingers" (07/30/2016)   Asthma    Bursitis of left shoulder    "just finished PT" (07/30/2016)   Chest pain    a. 2003 Abnl stress test-->Cath: nonobs CAD.   Chronic bronchitis (HCC)    Chronic kidney disease    stage 3   GERD (gastroesophageal reflux disease)    barrett's esophagus- Dr. Cristina Gong   History of hiatal hernia    Hyperlipidemia    Hypertension    Hypothyroidism    Migraines    "sporatic; at least a few/year" (07/30/2016)   Mitral regurgitation    a. 07/2016 Echo: mild to mod MR;  b. 07/2016 TEE: mild MR.   Moderate aortic insufficiency    a. 07/2016 Echo: EF 55-60%, mod AI;  b. 07/2016 TEE: EF 50-55%, mod AI.   Obesity    s/p lap band surgery 6/10   OSA on CPAP    PAF (paroxysmal atrial fibrillation) ([place])    a. 07/2016 TEE/DCCV: EF 50-55%, mild MR, mod AI, mild to mod TR, neg bubble study-->successful DCCV x1 w/ 120J.   PONV (postoperative nausea and vomiting)    Vitamin D deficiency    Past Surgical History:  Procedure Laterality Date   APPENDECTOMY     incidental   CARDIOVERSION N/A 08/01/2016   Procedure: CARDIOVERSION;  Surgeon: Jerline Pain, MD;  Location:  [facility] ENDOSCOPY;  Service: Cardiovascular;  Laterality: N/A;   CARDIOVERSION N/A 06/24/2019   Procedure: CARDIOVERSION;  Surgeon: Sanda Klein, MD;  Location: [place];  Service: Cardiovascular;  Laterality: N/A;   CARDIOVERSION N/A 05/30/2020   Procedure: CARDIOVERSION;  Surgeon: Sanda Klein, MD;  Location: [place] ENDOSCOPY;  Service: Cardiovascular;  Laterality: N/A;   CARDIOVERSION N/A 05/09/2020   Procedure: CARDIOVERSION;  Surgeon: [name], MD;  Location: [place];  Service: Cardiovascular;  Laterality: N/A;   CATARACT EXTRACTION W/ INTRAOCULAR LENS  IMPLANT, BILATERAL Bilateral ~ 2008-2017   left - right   COMBINED HYSTERECTOMY VAGINAL / OOPHORECTOMY / [facility] [place]  09/2008   RIGHT/LEFT HEART CATH AND CORONARY ANGIOGRAPHY N/A 04/11/2022   Procedure: RIGHT/LEFT HEART CATH AND CORONARY ANGIOGRAPHY;  Surgeon: Jettie Booze, MD;  Location: [place] [facility];  Service: Cardiovascular;  Laterality: N/A;   TEE WITHOUT CARDIOVERSION N/A 08/01/2016   Procedure: TRANSESOPHAGEAL ECHOCARDIOGRAM (TEE);  Surgeon: Jerline Pain, MD;  Location: [place];  Service: Cardiovascular;  Laterality: N/A;   [place]   "w/1 ovary"    Allergies  Allergies  Allergen Reactions   Clindamycin Hcl     Lip and face swelling  Augmentin [Amoxicillin-Pot Clavulanate] Diarrhea    Did it involve swelling of the face/tongue/throat, SOB, or low BP? No Did it involve sudden or severe rash/hives, skin peeling, or any reaction on the inside of your mouth or nose? No Did you need to seek medical attention at a hospital or doctor's office? No When did it last happen?      10 + years If all above answers are "NO", may proceed with cephalosporin use.    Codeine Nausea Only and Other (See Comments)    Hyperactivity    Demerol [Meperidine] Nausea And Vomiting   Sulfa Antibiotics Rash    History of Present Illness    77 year old female with the above past medical history including persistent atrial fibrillation, chronic diastolic heart failure, Takotsubo cardiomyopathy, 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   She was initially diagnosed with atrial fibrillation in April 2018.  She had a difficult time with rate control and underwent TEE guided DCCV.  She underwent repeat DCCV in March 2021 and again in 2022  at which time she was started on flecainide. Following her cardioversion in February 2022 she developed pulmonary edema and required a brief hospitalization.  Nuclear stress test in February 2022 showed no evidence of ischemia.  Echocardiogram in February 2022 showed EF 55 to 60%, mild mitral valve regurgitation, moderate aortic valve regurgitation, dilated left atrium, normal LVEF.  She was last seen in the office on 10/25/2021 and was stable from a cardiac standpoint.  She noted occasional palpitations but denied any breakthrough atrial fibrillation.  She presented to the ED on 04/09/2022 with shortness of breath, hypertensive urgency with SBP >200.  Echocardiogram showed EF 25 to 30%, severely decreased LV function, RWMA, mild LVH, normal RV systolic function, small pericardial effusion, moderate mitral valve regurgitation, moderate aortic valve regurgitation.  Troponin was elevated.  Cardiology was consulted and she underwent R/LHC on 04/11/2022 which revealed mild pulmonary hypertension, mild nonobstructive CAD, and ICM. Flecainide and verapamil were transitioned to amiodarone and metoprolol in the setting of reduced EF.  She did not tolerate Entresto due to hypotension.  It was thought that her acute systolic heart failure was  secondary to Takotsubo/stress-induced cardiomyopathy/type II MI in the setting of hypertensive urgency.  She was discharged home in stable condition on 04/12/2022.  She presents today for follow-up.  Since her hospitalization has been stable overall from a cardiac standpoint.  She denies any chest pain, dyspnea, palpitations, edema, PND, orthopnea, weight gain.  She does have a nonproductive cough and some nasal drainage.  BP has been stable, HR has been stable.  Overall, she reports feeling well.    Home Medications    Current Outpatient Medications  Medication Sig Dispense Refill   acetaminophen (TYLENOL) 650 MG CR tablet Take 1,300 mg by mouth every 8 (eight) hours as needed for pain.     Bacillus Coagulans-Inulin (ALIGN PREBIOTIC-PROBIOTIC PO) Take 2 capsules by mouth daily. Gummy     cholecalciferol (VITAMIN D) 1000 UNITS tablet Take 1,000 Units by mouth daily.     diclofenac Sodium (VOLTAREN) 1 % GEL Apply 1 application topically 2 (two) times daily.     fexofenadine (ALLEGRA) 180 MG tablet Take 180 mg by mouth daily.     fluticasone (FLONASE) 50 MCG/ACT nasal spray Place 2 sprays into both nostrils at bedtime.      Glucosamine-Chondroitin (OSTEO BI-FLEX REGULAR  STRENGTH PO) Take 1 tablet by mouth daily.     levothyroxine (SYNTHROID, LEVOTHROID) 50 MCG tablet Take 50 mcg by mouth daily before breakfast.     magnesium oxide (MAG-OX) 400 (240 Mg) MG tablet Take 1 tablet (400 mg total) by mouth daily. 90 tablet 3   metroNIDAZOLE (METROCREAM) 0.75 % cream Apply 1 application topically daily.     mometasone (ELOCON) 0.1 % cream Apply 1 application topically 2 (two) times daily as needed (skin irritation).     Multiple Vitamins-Minerals (CENTRUM MULTIGUMMIES) CHEW Chew 1 tablet by mouth daily.     omeprazole (PRILOSEC) 20 MG capsule Take 40 mg by mouth daily before breakfast.     senna (SENOKOT) 8.6 MG tablet Take 1 tablet by mouth every evening.     amiodarone (PACERONE) 200 MG tablet Take 1  tablet (200 mg total) by mouth daily. 90 tablet 3   apixaban (ELIQUIS) 5 MG TABS tablet Take 1 tablet (5 mg total) by mouth 2 (two) times daily. 180 tablet 3   ARNUITY ELLIPTA 50 MCG/ACT AEPB Inhale into the lungs.     atorvastatin (LIPITOR) 40 MG tablet Take 1 tablet (40 mg total) by mouth daily. 90 tablet 3   furosemide (LASIX) 40 MG tablet Take 1 tablet (40 mg total) by mouth as needed. For weight greater than 261 lbs 30 tablet 3   metoprolol succinate (TOPROL-XL) 100 MG 24 hr tablet Take 100 mg (one tablet) in the morning and 50 mg (half a tablet) in the evening. 135 tablet 3   sacubitril-valsartan (ENTRESTO) 49-51 MG Take 1 tablet by mouth 2 (two) times daily. 60 tablet 11   No current facility-administered medications for this visit.     Review of Systems    She denies chest pain, palpitations, dyspnea, pnd, orthopnea, n, v, dizziness, syncope, edema, weight gain, or early satiety. All other systems reviewed and are otherwise negative except as noted above.   Physical Exam    VS:  BP 114/72   Pulse 75   Ht '5\' 6"'$  (1.676 m)   Wt 262 lb (118.8 kg)   SpO2 98%   BMI 42.29 kg/m  GEN: Well nourished, well developed, in no acute distress. HEENT: normal. Neck: Supple, no JVD, carotid bruits, or masses. Cardiac: RRR, no murmurs, rubs, or gallops. No clubbing, cyanosis, edema.  Radials/DP/PT 2+ and equal bilaterally.  Respiratory:  Respirations regular and unlabored, clear to auscultation bilaterally. GI: Soft, nontender, nondistended, BS + x 4. MS: no deformity or atrophy. Skin: warm and dry, no rash. Neuro:  Strength and sensation are intact. Psych: Normal affect.  Accessory Clinical Findings    ECG personally reviewed by me today -NSR, 75 bpm- no acute changes.   Lab Results  Component Value Date   WBC 8.6 04/12/2022   HGB 10.5 (L) 04/12/2022   HCT 34.8 (L) 04/12/2022   MCV 81.3 04/12/2022   PLT 302 04/12/2022   Lab Results  Component Value Date   CREATININE 1.06 (H)  04/12/2022   BUN 22 04/12/2022   NA 141 04/12/2022   K 4.1 04/12/2022   CL 104 04/12/2022   CO2 25 04/12/2022   Lab Results  Component Value Date   ALT 12 04/11/2022   AST 16 04/11/2022   ALKPHOS 78 04/11/2022   BILITOT 0.7 04/11/2022   Lab Results  Component Value Date   CHOL 146 07/31/2016   HDL 32 (L) 07/31/2016   LDLCALC 79 07/31/2016   TRIG 174 (H) 07/31/2016  CHOLHDL 4.6 07/31/2016    Lab Results  Component Value Date   HGBA1C 5.6 07/31/2016    Assessment & Plan    1. Stress induced/Takotsubo cardiomyopathy/Type II MI in the setting of hypertensive urgency with acute systolic heart failure/chronic diastolic heart failure: Suspect stress-induced cardiomyopathy in the setting of type II MI secondary to hypertensive urgency.  Echo during recent hospitalization showed EF 25 to 30%, severely decreased LV function, RWMA, mild LVH, normal RV systolic function, small pericardial effusion, moderate mitral valve regurgitation, moderate aortic valve regurgitation. Cath with minimal CAD, mild pulmonary hypertension.  Euvolemic and well compensated on exam. Plan for repeat echo in 1 month. Will defer further escalation of GDMT at this time given suspected stress cardiomyopathy and pending repeat echocardiogram.  She will have BMET checked in 1 week with her PCP.  Continue losartan, metoprolol, Lasix.  2. Persistent atrial fibrillation: Maintaining NSR on amiodarone and metoprolol.  Verapamil and flecainide were discontinued in the setting of stress cardiomyopathy with reduced EF.  If EF improves, consider reinitiation of flecainide given only minimal CAD on recent cath, however, will defer to Dr. Sallyanne Kuster.  Continue amiodarone, metoprolol, Eliquis.  3. Valvular heart disease: Recent echo with moderate mitral valve regurgitation, moderate aortic valve regurgitation. Euvolemic and well compensated on exam, stable.   4. Hypertension: BP well controlled. Continue current antihypertensive  regimen.   5. Hyperlipidemia: LDL was 104 in 08/2021.  Monitored and managed per PCP.  Continue Crestor.  6. CKD stage IIIa: Creatinine was stable at 1.06 on 04/12/2022.  Recommend repeat BMET in 1 week with PCP.  7. Hypothyroidism: TSH was 3.6 in 03/2022.  Monitored and managed per PCP.  Continue levothyroxine.  8. OSA: Adherent to CPAP.  Denies any concerns.  9. Obesity: Encouraged activity as tolerated.  10. Disposition: Follow-up in 6 to 8 weeks with APP, follow-up in 3 to 5 months with Dr. Sallyanne Kuster.  Lenna Sciara, NP 05/06/2022, 7:16 AM

## 2022-04-19 NOTE — Addendum Note (Signed)
Addended by: Sanda Klein on: 04/19/2022 09:22 AM   Modules accepted: Orders

## 2022-04-23 DIAGNOSIS — J45909 Unspecified asthma, uncomplicated: Secondary | ICD-10-CM | POA: Diagnosis not present

## 2022-04-23 DIAGNOSIS — I5023 Acute on chronic systolic (congestive) heart failure: Secondary | ICD-10-CM | POA: Diagnosis not present

## 2022-04-24 DIAGNOSIS — G4733 Obstructive sleep apnea (adult) (pediatric): Secondary | ICD-10-CM | POA: Diagnosis not present

## 2022-04-26 ENCOUNTER — Ambulatory Visit: Payer: PPO | Admitting: Nurse Practitioner

## 2022-04-29 ENCOUNTER — Telehealth (HOSPITAL_COMMUNITY): Payer: Self-pay

## 2022-04-29 NOTE — Telephone Encounter (Signed)
Called to confirm Heart & Vascular Transitions of Care appointment at 04/30/22 @ 11:00am. Patient reminded to bring all medications and pill box organizer with them. Confirmed patient has transportation. Gave directions, instructed to utilize [facility] parking.  Confirmed appointment prior to ending call.

## 2022-04-30 ENCOUNTER — Other Ambulatory Visit (HOSPITAL_COMMUNITY): Payer: Self-pay

## 2022-04-30 ENCOUNTER — Ambulatory Visit (HOSPITAL_COMMUNITY)
Admit: 2022-04-30 | Discharge: 2022-04-30 | Disposition: A | Discharge status: Home / Self Care | Payer: PPO | Attending: Adult Health | Admitting: Adult Health

## 2022-04-30 VITALS — BP 130/90 | HR 78 | Wt 259.0 lb

## 2022-04-30 DIAGNOSIS — I160 Hypertensive urgency: Secondary | ICD-10-CM | POA: Insufficient documentation

## 2022-04-30 DIAGNOSIS — I083 Combined rheumatic disorders of mitral, aortic and tricuspid valves: Secondary | ICD-10-CM | POA: Diagnosis not present

## 2022-04-30 DIAGNOSIS — E785 Hyperlipidemia, unspecified: Secondary | ICD-10-CM | POA: Diagnosis not present

## 2022-04-30 DIAGNOSIS — Z79899 Other long term (current) drug therapy: Secondary | ICD-10-CM | POA: Insufficient documentation

## 2022-04-30 DIAGNOSIS — Z7901 Long term (current) use of anticoagulants: Secondary | ICD-10-CM | POA: Insufficient documentation

## 2022-04-30 DIAGNOSIS — I10 Essential (primary) hypertension: Secondary | ICD-10-CM | POA: Diagnosis not present

## 2022-04-30 DIAGNOSIS — G4733 Obstructive sleep apnea (adult) (pediatric): Secondary | ICD-10-CM | POA: Diagnosis not present

## 2022-04-30 DIAGNOSIS — I5040 Unspecified combined systolic (congestive) and diastolic (congestive) heart failure: Secondary | ICD-10-CM | POA: Diagnosis not present

## 2022-04-30 DIAGNOSIS — I480 Paroxysmal atrial fibrillation: Secondary | ICD-10-CM | POA: Diagnosis not present

## 2022-04-30 DIAGNOSIS — I5043 Acute on chronic combined systolic (congestive) and diastolic (congestive) heart failure: Secondary | ICD-10-CM

## 2022-04-30 DIAGNOSIS — I5181 Takotsubo syndrome: Secondary | ICD-10-CM | POA: Diagnosis not present

## 2022-04-30 DIAGNOSIS — I130 Hypertensive heart and chronic kidney disease with heart failure and stage 1 through stage 4 chronic kidney disease, or unspecified chronic kidney disease: Secondary | ICD-10-CM | POA: Diagnosis not present

## 2022-04-30 DIAGNOSIS — I428 Other cardiomyopathies: Secondary | ICD-10-CM | POA: Insufficient documentation

## 2022-04-30 MED ORDER — ENTRESTO 49-51 MG PO TABS: 1.0000 | ORAL_TABLET | Freq: Two times a day (BID) | ORAL | 11 refills | Status: DC

## 2022-04-30 MED ORDER — FUROSEMIDE 40 MG PO TABS: 40.0000 mg | ORAL_TABLET | ORAL | 3 refills | Status: DC | PRN

## 2022-04-30 NOTE — Progress Notes (Signed)
HEART & VASCULAR TRANSITION OF CARE CONSULT NOTE     Referring Physician:Dr Danford  Primary Care: Dr Lindell Noe  Primary Cardiologist:Dr Croituru  HPI: Referred to clinic by Dr Loleta Books  for heart failure consultation.   Leslie [name] is a 77 year old with a history of HTN, HLD, persistent A fib, aortic valve regurgitation, mitral valve insufficiency, OSA, and chronic HFrEF.   Admitted 04/09/22 with A/C combined heart failure and hypertensive urgency . Echo showed EF dropped from 55-60%-->25-30%. Possible Takotsubo. Cath with minimal CAD and preserved cardiac output. Started on GDMT. Discharged 04/12/22  Saw general cardiology 04/18/22. Stable at that time. No medication changes. Plan for repeat Echo next month.   She presents for post hospital TOC HF Clinic. Overall feeling fine. Limited by knee pain. Occasionally short of breath with exertion. Denies PND/Orthopnea. Appetite ok. No fever or chills. Weight at home 263-->257 pounds. Using CPAP every night. Taking all medication. SBP at home seems to be up and down lower in the morning 110 and higher in the evening 150. Lives alone. Independent with ADLS. Drives to appointments.   Cardiac Testing  Echo 03/2023  1. Left ventricular ejection fraction, by estimation, is 25 to 30%. The  left ventricle has severely decreased function. The left ventricle  demonstrates regional wall motion abnormalities (see scoring  diagram/findings for description). There is mild left   ventricular hypertrophy. Left ventricular diastolic parameters are  indeterminate.   2. Right ventricular systolic function is normal. The right ventricular  size is normal. Tricuspid regurgitation signal is inadequate for assessing  PA pressure.   3. A small pericardial effusion is present. The pericardial effusion is  anterior to the right ventricle.   4. The mitral valve is normal in structure. Moderate mitral valve  regurgitation. No evidence of mitral stenosis.   5.  The aortic valve is tricuspid. There is mild calcification of the  aortic valve. Aortic valve regurgitation is moderate - incompletely  interrogated. No aortic stenosis is present.   Echo 2022 EF 55-60%   RHC/LHC 03/2023  No coronary disease. There is no aortic valve stenosis.   PA sat 62%, PA pressure 48/20, mean 32 mmHg, RA 8, PCWP 15, CO 7.1, CI 3.1   Mild, nonobstructive coronary artery disease.  Mild pulmonary artery hypertension.  Nonischemic cardiomyopathy Review of Systems: [y] = yes, '[ ]'$  = no   General: Weight gain '[ ]'$ ; Weight loss '[ ]'$ ; Anorexia '[ ]'$ ; Fatigue '[ ]'$ ; Fever '[ ]'$ ; Chills '[ ]'$ ; Weakness '[ ]'$   Cardiac: Chest pain/pressure '[ ]'$ ; Resting SOB '[ ]'$ ; Exertional SOB [Y ]; Orthopnea '[ ]'$ ; Pedal Edema '[ ]'$ ; Palpitations '[ ]'$ ; Syncope '[ ]'$ ; Presyncope '[ ]'$ ; Paroxysmal nocturnal dyspnea'[ ]'$   Pulmonary: Cough '[ ]'$ ; Wheezing'[ ]'$ ; Hemoptysis'[ ]'$ ; Sputum '[ ]'$ ; Snoring '[ ]'$   GI: Vomiting'[ ]'$ ; Dysphagia'[ ]'$ ; Melena'[ ]'$ ; Hematochezia '[ ]'$ ; Heartburn'[ ]'$ ; Abdominal pain '[ ]'$ ; Constipation '[ ]'$ ; Diarrhea '[ ]'$ ; BRBPR '[ ]'$   GU: Hematuria'[ ]'$ ; Dysuria '[ ]'$ ; Nocturia'[ ]'$   Vascular: Pain in legs with walking '[ ]'$ ; Pain in feet with lying flat '[ ]'$ ; Non-healing sores '[ ]'$ ; Stroke '[ ]'$ ; TIA '[ ]'$ ; Slurred speech '[ ]'$ ;  Neuro: Headaches'[ ]'$ ; Vertigo'[ ]'$ ; Seizures'[ ]'$ ; Paresthesias'[ ]'$ ;Blurred vision '[ ]'$ ; Diplopia '[ ]'$ ; Vision changes '[ ]'$   Ortho/Skin: Arthritis '[ ]'$ ; Joint pain [ Y]; Muscle pain '[ ]'$ ; Joint swelling '[ ]'$ ; Back Pain [ Y]; Rash '[ ]'$   Psych: Depression'[ ]'$ ; Anxiety'[ ]'$   Heme: Bleeding problems '[ ]'$ ; Clotting disorders '[ ]'$ ; Anemia '[ ]'$   Endocrine: Diabetes '[ ]'$ ; Thyroid dysfunction[Y ]   Past Medical History:  Diagnosis Date   Adenomatous polyp    Arthritis    "knees, legs, fingers" (07/30/2016)   Asthma    Bursitis of left shoulder    "just finished PT" (07/30/2016)   Chest pain    a. 2003 Abnl stress test-->Cath: nonobs CAD.   Chronic bronchitis (HCC)    Chronic kidney disease    stage 3   GERD (gastroesophageal reflux  disease)    barrett's esophagus- Dr. Cristina Gong   History of hiatal hernia    Hyperlipidemia    Hypertension    Hypothyroidism    Migraines    "sporatic; at least a few/year" (07/30/2016)   Mitral regurgitation    a. 07/2016 Echo: mild to mod MR;  b. 07/2016 TEE: mild MR.   Moderate aortic insufficiency    a. 07/2016 Echo: EF 55-60%, mod AI;  b. 07/2016 TEE: EF 50-55%, mod AI.   Obesity    s/p lap band surgery 6/10   OSA on CPAP    PAF (paroxysmal atrial fibrillation) ([place])    a. 07/2016 TEE/DCCV: EF 50-55%, mild MR, mod AI, mild to mod TR, neg bubble study-->successful DCCV x1 w/ 120J.   PONV (postoperative nausea and vomiting)    Vitamin D deficiency     Current Outpatient Medications  Medication Sig Dispense Refill   acetaminophen (TYLENOL) 650 MG CR tablet Take 1,300 mg by mouth every 8 (eight) hours as needed for pain.     amiodarone (PACERONE) 200 MG tablet Take 1 tablet (200 mg total) by mouth daily. 90 tablet 3   apixaban (ELIQUIS) 5 MG TABS tablet Take 1 tablet (5 mg total) by mouth 2 (two) times daily. 180 tablet 3   ARNUITY ELLIPTA 50 MCG/ACT AEPB Inhale into the lungs.     atorvastatin (LIPITOR) 40 MG tablet Take 1 tablet (40 mg total) by mouth daily. 90 tablet 3   Bacillus Coagulans-Inulin (ALIGN PREBIOTIC-PROBIOTIC PO) Take 2 capsules by mouth daily. Gummy     cholecalciferol (VITAMIN D) 1000 UNITS tablet Take 1,000 Units by mouth daily.     diclofenac Sodium (VOLTAREN) 1 % GEL Apply 1 application topically 2 (two) times daily.     fexofenadine (ALLEGRA) 180 MG tablet Take 180 mg by mouth daily.     fluticasone (FLONASE) 50 MCG/ACT nasal spray Place 2 sprays into both nostrils at bedtime.      furosemide (LASIX) 40 MG tablet Take 1 tablet (40 mg total) by mouth daily. 90 tablet 3   Glucosamine-Chondroitin (OSTEO BI-FLEX REGULAR STRENGTH PO) Take 1 tablet by mouth daily.     levothyroxine (SYNTHROID, LEVOTHROID) 50 MCG tablet Take 50 mcg by mouth daily before breakfast.      losartan (COZAAR) 25 MG tablet Take 1 tablet (25 mg total) by mouth daily. 90 tablet 3   magnesium oxide (MAG-OX) 400 (240 Mg) MG tablet Take 1 tablet (400 mg total) by mouth daily. 90 tablet 3   metoprolol succinate (TOPROL-XL) 100 MG 24 hr tablet Take 100 mg (one tablet) in the morning and 50 mg (half a tablet) in the evening. 135 tablet 3   metroNIDAZOLE (METROCREAM) 0.75 % cream Apply 1 application topically daily.     mometasone (ELOCON) 0.1 % cream Apply 1 application topically 2 (two) times daily as needed (skin irritation).     Multiple Vitamins-Minerals (CENTRUM MULTIGUMMIES) CHEW Chew 1  tablet by mouth daily.     omeprazole (PRILOSEC) 20 MG capsule Take 40 mg by mouth daily before breakfast.     senna (SENOKOT) 8.6 MG tablet Take 1 tablet by mouth every evening.     No current facility-administered medications for this encounter.    Allergies  Allergen Reactions   Clindamycin Hcl     Lip and face swelling   Augmentin [Amoxicillin-Pot Clavulanate] Diarrhea    Did it involve swelling of the face/tongue/throat, SOB, or low BP? No Did it involve sudden or severe rash/hives, skin peeling, or any reaction on the inside of your mouth or nose? No Did you need to seek medical attention at a hospital or doctor's office? No When did it last happen?      10 + years If all above answers are "NO", may proceed with cephalosporin use.    Codeine Nausea Only and Other (See Comments)    Hyperactivity   Demerol [Meperidine] Nausea And Vomiting   Sulfa Antibiotics Rash      Social History   Socioeconomic History   Marital status: Divorced    Spouse name: Not on file   Number of children: Not on file   Years of education: Not on file   Highest education level: Not on file  Occupational History   Not on file  Tobacco Use   Smoking status: Former   Smokeless tobacco: Never   Tobacco comments:    07/30/2016 "social smoker only; in college"  Vaping Use   Vaping Use: Never used   Substance and Sexual Activity   Alcohol use: Yes    Alcohol/week: 2.0 standard drinks of alcohol    Types: 1 Glasses of wine, 1 Standard drinks or equivalent per week    Comment: 07/30/2016 "couple drinks/month"   Drug use: No   Sexual activity: Not Currently  Other Topics Concern   Not on file  Social History Narrative   Not on file   Social Determinants of Health   Financial Resource Strain: Medium Risk (04/12/2022)   Overall Financial Resource Strain (CARDIA)    Difficulty of Paying Living Expenses: Somewhat hard  Food Insecurity: No Food Insecurity (04/09/2022)   Hunger Vital Sign    Worried About Running Out of Food in the Last Year: Never true    Ran Out of Food in the Last Year: Never true  Transportation Needs: No Transportation Needs (04/09/2022)   PRAPARE - Hydrologist (Medical): No    Lack of Transportation (Non-Medical): No  Physical Activity: Not on file  Stress: Not on file  Social Connections: Not on file  Intimate Partner Violence: Not At Risk (04/09/2022)   Humiliation, Afraid, Rape, and Kick questionnaire    Fear of Current or Ex-Partner: No    Emotionally Abused: No    Physically Abused: No    Sexually Abused: No      Family History  Problem Relation Age of Onset   Heart failure Mother    Hypertension Mother    Hypertension Father    Emphysema Father     Vitals:   04/30/22 1102 04/30/22 1127  BP: (!) 130/90 (!) 130/90  Pulse: 78   SpO2: 97%   Weight: 117.5 kg (259 lb)    Wt Readings from Last 3 Encounters:  04/30/22 117.5 kg (259 lb)  04/18/22 118.8 kg (262 lb)  04/12/22 120.1 kg (264 lb 11.2 oz)    PHYSICAL EXAM: General:  Walked in the clinic. No respiratory  difficulty HEENT: normal Neck: supple. no JVD. Carotids 2+ bilat; no bruits. No lymphadenopathy or thryomegaly appreciated. Cor: PMI nondisplaced. Regular rate & rhythm. No rubs, gallops or murmurs. Lungs: clear Abdomen: soft, nontender,  nondistended. No hepatosplenomegaly. No bruits or masses. Good bowel sounds. Extremities: no cyanosis, clubbing, rash, edema Neuro: alert & oriented x 3, cranial nerves grossly intact. moves all 4 extremities w/o difficulty. Affect pleasant.  ECG: SR 82 bpm    ASSESSMENT & PLAN: 1. Chronic HFrEF, NICM  -Echo EF dropped from 55-60% --> 25-30% suspected Takotsubo. ? Hypertensive component.  - Repeat Echo next month.  NYHA II. Functionally doing ok. Limited by joint pain.  GDMT . We discussed GDMT. Including [place].  Diuretic- stop po lasix. Change lasix to as needed for weight 261 pounds or greater.  BB- Continue Toprol XL 100 mg daily /50 mg in evening  Ace/ARB/ARNI- Stop losartan. Start entresto 49-51 mg twice a day. Discussed purpose.  Given 30 day coupon for entresto. Will repeat BMET in 7 days.  MRA-Consider spironolactone next visit.  SGLT2i- Consider jardiance next visit.  Repeat ECHO next month  I reviewed BMET from 04/23/2022 K 4.5 Creatinine 1.2  2. PAF - EKG - SR. Discussed  -Continue amio 200 mg daily.  - Continue Toprol XL.  Discussed possible side effects of amiodarone. Long term would like to get off amiodarone.   Continue eliquis 5 mg twice a day.   3. HTN  Elevated. As above switch from losartan to entresto.    4. OSA Using CPAP at night.   We contacted her PCP for lab results. BMET from 04/23/22 K 4.5 Creatinine 1.2,. I discussed recommendations her daughter with the above medications changes.    Referred to HFSW (PCP, Medications, Transportation, ETOH Abuse, Drug Abuse, Insurance, Financial ): No Refer to Pharmacy:  No Refer to Home Health:  No Refer to Advanced Heart Failure Clinic: No  Refer to General Cardiology: She is a patient of Dr Sallyanne Kuster and has follow up.   Follow up as needed based on Dr Sallyanne Kuster recommendations.   Leslie Raimer NP-C  11:03 AM

## 2022-04-30 NOTE — Patient Instructions (Signed)
Stop Losartan Start Entresto 49/51 mg twice daily; provided with 30 day free coupon plus co-pay coupon. Repeat labs in 7 days.  Only take Lasix (water pill) as needed for weight greater than 261 lbs. Follow up with our Clinic if needed. Call [phone] if any questions or concerns.

## 2022-05-02 ENCOUNTER — Encounter: Payer: Self-pay | Admitting: Physician Assistant

## 2022-05-02 ENCOUNTER — Other Ambulatory Visit (HOSPITAL_COMMUNITY): Payer: Self-pay

## 2022-05-07 ENCOUNTER — Other Ambulatory Visit (HOSPITAL_COMMUNITY): Payer: Self-pay | Admitting: Adult Health

## 2022-05-08 ENCOUNTER — Other Ambulatory Visit (HOSPITAL_COMMUNITY): Payer: Self-pay | Admitting: *Deleted

## 2022-05-08 ENCOUNTER — Other Ambulatory Visit (HOSPITAL_COMMUNITY): Payer: Self-pay

## 2022-05-08 ENCOUNTER — Ambulatory Visit (HOSPITAL_COMMUNITY)
Admission: RE | Admit: 2022-05-08 | Discharge: 2022-05-08 | Disposition: A | Discharge status: Home / Self Care | Payer: PPO | Source: Ambulatory Visit | Attending: Internal Medicine | Admitting: Internal Medicine

## 2022-05-08 DIAGNOSIS — I5043 Acute on chronic combined systolic (congestive) and diastolic (congestive) heart failure: Secondary | ICD-10-CM | POA: Insufficient documentation

## 2022-05-08 DIAGNOSIS — I5032 Chronic diastolic (congestive) heart failure: Secondary | ICD-10-CM

## 2022-05-08 DIAGNOSIS — Z79899 Other long term (current) drug therapy: Secondary | ICD-10-CM | POA: Diagnosis not present

## 2022-05-08 LAB — BASIC METABOLIC PANEL
Anion gap: 10 (ref 5–15)
BUN: 19 mg/dL (ref 8–23)
CO2: 24 mmol/L (ref 22–32)
Calcium: 8.1 mg/dL — ABNORMAL LOW (ref 8.9–10.3)
Chloride: 105 mmol/L (ref 98–111)
Creatinine, Ser: 1.13 mg/dL — ABNORMAL HIGH (ref 0.44–1.00)
GFR, Estimated: 51 mL/min — ABNORMAL LOW (ref >60)
Glucose, Bld: 112 mg/dL — ABNORMAL HIGH (ref 70–99)
Potassium: 4.2 mmol/L (ref 3.5–5.1)
Sodium: 139 mmol/L (ref 135–145)

## 2022-05-13 ENCOUNTER — Ambulatory Visit: Payer: PPO | Admitting: Nurse Practitioner

## 2022-05-22 ENCOUNTER — Ambulatory Visit (HOSPITAL_COMMUNITY): Payer: PPO | Attending: Nurse Practitioner

## 2022-05-22 DIAGNOSIS — I5181 Takotsubo syndrome: Secondary | ICD-10-CM | POA: Diagnosis not present

## 2022-05-22 LAB — ECHOCARDIOGRAM COMPLETE
Area-P 1/2: 3.54 cm2
MV M vel: 5.5 m/s
MV Peak grad: 121 mmHg
P 1/2 time: 653 msec
S' Lateral: 3 cm

## 2022-05-23 ENCOUNTER — Telehealth (HOSPITAL_COMMUNITY): Payer: Self-pay

## 2022-05-23 ENCOUNTER — Encounter (HOSPITAL_COMMUNITY): Payer: Self-pay | Admitting: *Deleted

## 2022-05-23 ENCOUNTER — Encounter (HOSPITAL_COMMUNITY): Payer: Self-pay

## 2022-05-23 NOTE — Telephone Encounter (Signed)
Pt insurance is active and benefits verified through [employer] adv Co-pay $15, DED 0/0 met, out of pocket $3,200/$316.80 met, co-insurance 0%. no pre-authorization required, Angela/HTA 05/23/2022'@10'$ :31am, REF# [id number]   How many CR sessions are covered? (72 sessions for ICR)72 Is this a lifetime maximum or an annual maximum? annual Has the member used any of these services to date? no Is there a time limit (weeks/months) on start of program and/or program completion? no

## 2022-05-25 DIAGNOSIS — G4733 Obstructive sleep apnea (adult) (pediatric): Secondary | ICD-10-CM | POA: Diagnosis not present

## 2022-05-27 ENCOUNTER — Telehealth: Payer: Self-pay

## 2022-05-27 ENCOUNTER — Telehealth (HOSPITAL_COMMUNITY): Payer: Self-pay

## 2022-05-27 NOTE — Telephone Encounter (Signed)
Spoke with pt. Pt was notified of Echo results. Pt will f/u and continue her current medications.

## 2022-05-27 NOTE — Telephone Encounter (Signed)
RN called to confirm CR rehab appointment and completed Nursing Assessment. Instructions and directions reviewed and confirmed.

## 2022-05-28 ENCOUNTER — Encounter (HOSPITAL_COMMUNITY)
Admission: RE | Admit: 2022-05-28 | Discharge: 2022-05-28 | Disposition: A | Discharge status: Home / Self Care | Payer: PPO | Source: Ambulatory Visit | Attending: Cardiovascular Disease | Admitting: Cardiovascular Disease

## 2022-05-28 ENCOUNTER — Encounter (HOSPITAL_COMMUNITY): Payer: Self-pay

## 2022-05-28 VITALS — BP 110/64 | HR 72 | Ht 65.0 in | Wt 261.2 lb

## 2022-05-28 DIAGNOSIS — I5181 Takotsubo syndrome: Secondary | ICD-10-CM | POA: Insufficient documentation

## 2022-05-28 DIAGNOSIS — Z5189 Encounter for other specified aftercare: Secondary | ICD-10-CM | POA: Insufficient documentation

## 2022-05-28 DIAGNOSIS — I5022 Chronic systolic (congestive) heart failure: Secondary | ICD-10-CM | POA: Insufficient documentation

## 2022-05-28 HISTORY — DX: Heart failure, unspecified: I50.9

## 2022-05-28 NOTE — Progress Notes (Signed)
Cardiac Individual Treatment Plan  Patient Details  Name: Leslie [name] MRN: [id number] Date of Birth: April 08, 1946 Referring Provider:   [place] from 05/28/2022 in [facility]  Referring Provider Sanda Klein, MD       Initial Encounter Date:  [place] from 05/28/2022 in [facility]  Date 05/28/22       Visit Diagnosis: Heart failure, chronic systolic ([place])  Patient's Home Medications on Admission:  Current Outpatient Medications:    acetaminophen (TYLENOL) 650 MG CR tablet, Take 1,300 mg by mouth every 8 (eight) hours as needed for pain., Disp: , Rfl:    amiodarone (PACERONE) 200 MG tablet, Take 1 tablet (200 mg total) by mouth daily., Disp: 90 tablet, Rfl: 3   apixaban (ELIQUIS) 5 MG TABS tablet, Take 1 tablet (5 mg total) by mouth 2 (two) times daily., Disp: 180 tablet, Rfl: 3   ARNUITY ELLIPTA 50 MCG/ACT AEPB, Inhale into the lungs., Disp: , Rfl:    atorvastatin (LIPITOR) 40 MG tablet, Take 1 tablet (40 mg total) by mouth daily., Disp: 90 tablet, Rfl: 3   Bacillus Coagulans-Inulin (ALIGN PREBIOTIC-PROBIOTIC PO), Take 2 capsules by mouth daily. Gummy, Disp: , Rfl:    cholecalciferol (VITAMIN D) 1000 UNITS tablet, Take 1,000 Units by mouth daily., Disp: , Rfl:    diclofenac Sodium (VOLTAREN) 1 % GEL, Apply 1 application topically 2 (two) times daily., Disp: , Rfl:    fexofenadine (ALLEGRA) 180 MG tablet, Take 180 mg by mouth daily., Disp: , Rfl:    fluticasone (FLONASE) 50 MCG/ACT nasal spray, Place 2 sprays into both nostrils at bedtime. , Disp: , Rfl:    furosemide (LASIX) 40 MG tablet, Take 1 tablet (40 mg total) by mouth as needed. For weight greater than 261 lbs, Disp: 30 tablet, Rfl: 3   Glucosamine-Chondroitin (OSTEO BI-FLEX REGULAR STRENGTH PO), Take 1 tablet by mouth daily., Disp: ,  Rfl:    levothyroxine (SYNTHROID, LEVOTHROID) 50 MCG tablet, Take 50 mcg by mouth daily before breakfast., Disp: , Rfl:    magnesium oxide (MAG-OX) 400 (240 Mg) MG tablet, Take 1 tablet (400 mg total) by mouth daily., Disp: 90 tablet, Rfl: 3   metoprolol succinate (TOPROL-XL) 100 MG 24 hr tablet, Take 100 mg (one tablet) in the morning and 50 mg (half a tablet) in the evening., Disp: 135 tablet, Rfl: 3   metroNIDAZOLE (METROCREAM) 0.75 % cream, Apply 1 application  topically daily as needed., Disp: , Rfl:    mometasone (ELOCON) 0.1 % cream, Apply 1 application topically 2 (two) times daily as needed (skin irritation)., Disp: , Rfl:    Multiple Vitamins-Minerals (CENTRUM MULTIGUMMIES) CHEW, Chew 1 tablet by mouth daily., Disp: , Rfl:    omeprazole (PRILOSEC) 20 MG capsule, Take 40 mg by mouth daily before breakfast., Disp: , Rfl:    sacubitril-valsartan (ENTRESTO) 49-51 MG, Take 1 tablet by mouth 2 (two) times daily., Disp: 60 tablet, Rfl: 11   senna (SENOKOT) 8.6 MG tablet, Take 1 tablet by mouth every evening., Disp: , Rfl:   Past Medical History: Past Medical History:  Diagnosis Date   Adenomatous polyp    Arthritis    "knees, legs, fingers" (07/30/2016)   Asthma    Bursitis of left shoulder    "just finished PT" (07/30/2016)   Chest pain    a. 2003 Abnl stress test-->Cath: nonobs  CAD.   CHF (congestive heart failure) (HCC)    Chronic bronchitis (HCC)    Chronic kidney disease    stage 3   GERD (gastroesophageal reflux disease)    barrett's esophagus- Dr. Cristina Gong   History of hiatal hernia    Hyperlipidemia    Hypertension    Hypothyroidism    Migraines    "sporatic; at least a few/year" (07/30/2016)   Mitral regurgitation    a. 07/2016 Echo: mild to mod MR;  b. 07/2016 TEE: mild MR.   Moderate aortic insufficiency    a. 07/2016 Echo: EF 55-60%, mod AI;  b. 07/2016 TEE: EF 50-55%, mod AI.   Obesity    s/p lap band surgery 6/10   OSA on CPAP    PAF (paroxysmal atrial fibrillation)  ([place])    a. 07/2016 TEE/DCCV: EF 50-55%, mild MR, mod AI, mild to mod TR, neg bubble study-->successful DCCV x1 w/ 120J.   PONV (postoperative nausea and vomiting)    Vitamin D deficiency     Tobacco Use: Social History   Tobacco Use  Smoking Status Former  Smokeless Tobacco Never  Tobacco Comments   07/30/2016 "social smoker only; in college"    Labs: Review Flowsheet  More data exists      Latest Ref Rng & Units 07/31/2016 06/24/2019 05/09/2020 09/09/2020 04/11/2022  Labs for ITP Cardiac and Pulmonary Rehab  Cholestrol 0 - 200 mg/dL 146  - - - -  LDL (calc) 0 - 99 mg/dL 79  - - - -  HDL-C >40 mg/dL 32  - - - -  Trlycerides <150 mg/dL 174  - - - -  Hemoglobin A1c 4.8 - 5.6 % 5.6  - - - -  PH, Arterial 7.35 - 7.45 - - - - 7.412   PCO2 arterial 32 - 48 mmHg - - - - 39.0   Bicarbonate 20.0 - 28.0 mmol/L 20.0 - 28.0 mmol/L - - - - 26.5  27.4  24.9   TCO2 22 - 32 mmol/L 22 - 32 mmol/L - 23  22  19  28  29  26   $ O2 Saturation % % - - - - 62  62  92     Capillary Blood Glucose: Lab Results  Component Value Date   GLUCAP 135 (H) 09/09/2020     Exercise Target Goals: Exercise Program Goal: Individual exercise prescription set using results from initial 6 min walk test and THRR while considering  patient's activity barriers and safety.   Exercise Prescription Goal: Initial exercise prescription builds to 30-45 minutes a day of aerobic activity, 2-3 days per week.  Home exercise guidelines will be given to patient during program as part of exercise prescription that the participant will acknowledge.  Activity Barriers & Risk Stratification:  Activity Barriers & Cardiac Risk Stratification - 05/28/22 1259       Activity Barriers & Cardiac Risk Stratification   Activity Barriers Arthritis;Neck/Spine Problems;Joint Problems;Deconditioning;Muscular Weakness;Decreased Ventricular Function;Balance Concerns    Cardiac Risk Stratification High             6 Minute Walk:  6  Minute Walk     Row Name 05/28/22 1256         6 Minute Walk   Phase Initial  Nustep used     Distance 1574 feet  Nustep used     Walk Time 6 minutes     # of Rest Breaks 0     MPH 3     METS  2.2     RPE 11     Perceived Dyspnea  0     VO2 Peak 7.1     Symptoms Yes (comment)     Comments Rt shoulder apin 6/10; right knee pain 6/10; left knee pain 5/10; left shoulder pain 4/10; left foot pain 5/10     Resting HR 68 bpm     Resting BP 110/64     Resting Oxygen Saturation  98 %     Exercise Oxygen Saturation  during 6 min walk 97 %     Max Ex. HR 100 bpm     Max Ex. BP 114/74     2 Minute Post BP 104/70              Oxygen Initial Assessment:   Oxygen Re-Evaluation:   Oxygen Discharge (Final Oxygen Re-Evaluation):   Initial Exercise Prescription:  Initial Exercise Prescription - 05/28/22 1300       Date of Initial Exercise RX and Referring Provider   Date 05/28/22    Referring Provider Sanda Klein, MD    Expected Discharge Date 08/09/22      T5 Nustep   Level 1    SPM 75    Minutes 25    METs 2      Prescription Details   Frequency (times per week) 3    Duration Progress to 30 minutes of continuous aerobic without signs/symptoms of physical distress      Intensity   THRR 40-80% of Max Heartrate 58 - 115    Ratings of Perceived Exertion 11-13    Perceived Dyspnea 0-4      Progression   Progression Continue progressive overload as per policy without signs/symptoms or physical distress.      Resistance Training   Training Prescription Yes    Weight 2 lbs    Reps 10-15             Perform Capillary Blood Glucose checks as needed.  Exercise Prescription Changes:   Exercise Comments:   Exercise Goals and Review:   Exercise Goals     Row Name 05/28/22 1259             Exercise Goals   Increase Physical Activity Yes       Intervention Provide advice, education, support and counseling about physical activity/exercise  needs.;Develop an individualized exercise prescription for aerobic and resistive training based on initial evaluation findings, risk stratification, comorbidities and participant's personal goals.       Expected Outcomes Short Term: Attend rehab on a regular basis to increase amount of physical activity.;Long Term: Add in home exercise to make exercise part of routine and to increase amount of physical activity.;Long Term: Exercising regularly at least 3-5 days a week.       Increase Strength and Stamina Yes       Intervention Provide advice, education, support and counseling about physical activity/exercise needs.;Develop an individualized exercise prescription for aerobic and resistive training based on initial evaluation findings, risk stratification, comorbidities and participant's personal goals.       Expected Outcomes Short Term: Increase workloads from initial exercise prescription for resistance, speed, and METs.;Short Term: Perform resistance training exercises routinely during rehab and add in resistance training at home;Long Term: Improve cardiorespiratory fitness, muscular endurance and strength as measured by increased METs and functional capacity (6MWT)       Able to understand and use rate of perceived exertion (RPE) scale Yes       Intervention  Provide education and explanation on how to use RPE scale       Expected Outcomes Short Term: Able to use RPE daily in rehab to express subjective intensity level;Long Term:  Able to use RPE to guide intensity level when exercising independently       Knowledge and understanding of Target Heart Rate Range (THRR) Yes       Intervention Provide education and explanation of THRR including how the numbers were predicted and where they are located for reference       Expected Outcomes Short Term: Able to state/look up THRR;Short Term: Able to use daily as guideline for intensity in rehab;Long Term: Able to use THRR to govern intensity when exercising  independently       Understanding of Exercise Prescription Yes       Intervention Provide education, explanation, and written materials on patient's individual exercise prescription       Expected Outcomes Short Term: Able to explain program exercise prescription;Long Term: Able to explain home exercise prescription to exercise independently                Exercise Goals Re-Evaluation :   Discharge Exercise Prescription (Final Exercise Prescription Changes):   Nutrition:  Target Goals: Understanding of nutrition guidelines, daily intake of sodium <152m, cholesterol <2084m calories 30% from fat and 7% or less from saturated fats, daily to have 5 or more servings of fruits and vegetables.  Biometrics:  Pre Biometrics - 05/28/22 1035       Pre Biometrics   Waist Circumference 48 inches    Hip Circumference 55.5 inches    Waist to Hip Ratio 0.86 %    Triceps Skinfold 46 mm    % Body Fat 55.2 %    Grip Strength 20 kg    Flexibility 11.25 in    Single Leg Stand 1.85 seconds              Nutrition Therapy Plan and Nutrition Goals:   Nutrition Assessments:  MEDIFICTS Score Key: ?70 Need to make dietary changes  40-70 Heart Healthy Diet ? 40 Therapeutic Level Cholesterol Diet    Picture Your Plate Scores: <[id number] dietary pattern with much room for improvement. 41-50 Dietary pattern unlikely to meet recommendations for good health and room for improvement. 51-60 More healthful dietary pattern, with some room for improvement.  >60 Healthy dietary pattern, although there may be some specific behaviors that could be improved.    Nutrition Goals Re-Evaluation:   Nutrition Goals Re-Evaluation:   Nutrition Goals Discharge (Final Nutrition Goals Re-Evaluation):   Psychosocial: Target Goals: Acknowledge presence or absence of significant depression and/or stress, maximize coping skills, provide positive support system. Participant is able to verbalize types  and ability to use techniques and skills needed for reducing stress and depression.  Initial Review & Psychosocial Screening:  Initial Psych Review & Screening - 05/28/22 1622       Initial Review   Current issues with Current Sleep Concerns;Current Anxiety/Panic      Family Dynamics   Good Support System? Yes   [name] lives alone she has friends who live in the area and a daughter for support who lives in [place]   Barriers   Psychosocial barriers to participate in program The patient should benefit from training in stress management and relaxation.      Screening Interventions   Interventions Encouraged to exercise             Quality of  Life Scores:  Quality of Life - 05/28/22 1248       Quality of Life   Select Quality of Life      Quality of Life Scores   Health/Function Pre 21.39 %    Socioeconomic Pre 30 %    Psych/Spiritual Pre 23.79 %    Family Pre 27 %    GLOBAL Pre 24.32 %            Scores of 19 and below usually indicate a poorer quality of life in these areas.  A difference of  2-3 points is a clinically meaningful difference.  A difference of 2-3 points in the total score of the Quality of Life Index has been associated with significant improvement in overall quality of life, self-image, physical symptoms, and general health in studies assessing change in quality of life.  PHQ-9: Review Flowsheet       05/28/2022 02/22/2022  Depression screen PHQ 2/9  Decreased Interest 0 0  Down, Depressed, Hopeless 0 0  PHQ - 2 Score 0 0  Altered sleeping 2 -  Tired, decreased energy 1 -  Change in appetite 0 -  Feeling bad or failure about yourself  0 -  Trouble concentrating 0 -  Moving slowly or fidgety/restless 0 -  Suicidal thoughts 0 -  PHQ-9 Score 3 -  Difficult doing work/chores Not difficult at all -   Interpretation of Total Score  Total Score Depression Severity:  1-4 = Minimal depression, 5-9 = Mild depression, 10-14 = Moderate depression,  15-19 = Moderately severe depression, 20-27 = Severe depression   Psychosocial Evaluation and Intervention:   Psychosocial Re-Evaluation:   Psychosocial Discharge (Final Psychosocial Re-Evaluation):   Vocational Rehabilitation: Provide vocational rehab assistance to qualifying candidates.   Vocational Rehab Evaluation & Intervention:  Vocational Rehab - 05/28/22 1624       Initial Vocational Rehab Evaluation & Intervention   Assessment shows need for Vocational Rehabilitation No   Carlyon Prows is retired and does not need vocational rehab at this time            Education: Education Goals: Education classes will be provided on a weekly basis, covering required topics. Participant will state understanding/return demonstration of topics presented.     Core Videos: Exercise    Move It!  Clinical staff conducted group or individual video education with verbal and written material and guidebook.  Patient learns the recommended Pritikin exercise program. Exercise with the goal of living a long, healthy life. Some of the health benefits of exercise include controlled diabetes, healthier blood pressure levels, improved cholesterol levels, improved heart and lung capacity, improved sleep, and better body composition. Everyone should speak with their doctor before starting or changing an exercise routine.  Biomechanical Limitations Clinical staff conducted group or individual video education with verbal and written material and guidebook.  Patient learns how biomechanical limitations can impact exercise and how we can mitigate and possibly overcome limitations to have an impactful and balanced exercise routine.  Body Composition Clinical staff conducted group or individual video education with verbal and written material and guidebook.  Patient learns that body composition (ratio of muscle mass to fat mass) is a key component to assessing overall fitness, rather than body weight alone.  Increased fat mass, especially visceral belly fat, can put [place] at increased risk for metabolic syndrome, type 2 diabetes, heart disease, and even death. It is recommended to combine diet and exercise (cardiovascular and resistance training) to improve your body composition.  Seek guidance from your physician and exercise physiologist before implementing an exercise routine.  Exercise Action Plan Clinical staff conducted group or individual video education with verbal and written material and guidebook.  Patient learns the recommended strategies to achieve and enjoy long-term exercise adherence, including variety, self-motivation, self-efficacy, and positive decision making. Benefits of exercise include fitness, good health, weight management, more energy, better sleep, less stress, and overall well-being.  Medical   Heart Disease Risk Reduction Clinical staff conducted group or individual video education with verbal and written material and guidebook.  Patient learns our heart is our most vital organ as it circulates oxygen, nutrients, white blood cells, and hormones throughout the entire body, and carries waste away. Data supports a plant-based eating plan like the Pritikin Program for its effectiveness in slowing progression of and reversing heart disease. The video provides a number of recommendations to address heart disease.   Metabolic Syndrome and Belly Fat  Clinical staff conducted group or individual video education with verbal and written material and guidebook.  Patient learns what metabolic syndrome is, how it leads to heart disease, and how one can reverse it and keep it from coming back. You have metabolic syndrome if you have 3 of the following 5 criteria: abdominal obesity, high blood pressure, high triglycerides, low HDL cholesterol, and high blood sugar.  Hypertension and Heart Disease Clinical staff conducted group or individual video education with verbal and written material and  guidebook.  Patient learns that high blood pressure, or hypertension, is very common in the [place]. Hypertension is largely due to excessive salt intake, but other important risk factors include being overweight, physical inactivity, drinking too much alcohol, smoking, and not eating enough potassium from fruits and vegetables. High blood pressure is a leading risk factor for heart attack, stroke, congestive heart failure, dementia, kidney failure, and premature death. Long-term effects of excessive salt intake include stiffening of the arteries and thickening of heart muscle and organ damage. Recommendations include ways to reduce hypertension and the risk of heart disease.  Diseases of Our Time - Focusing on Diabetes Clinical staff conducted group or individual video education with verbal and written material and guidebook.  Patient learns why the best way to stop diseases of our time is prevention, through food and other lifestyle changes. Medicine (such as prescription pills and surgeries) is often only a Band-Aid on the problem, not a long-term solution. Most common diseases of our time include obesity, type 2 diabetes, hypertension, heart disease, and cancer. The Pritikin Program is recommended and has been proven to help reduce, reverse, and/or prevent the damaging effects of metabolic syndrome.  Nutrition   Overview of the Pritikin Eating Plan  Clinical staff conducted group or individual video education with verbal and written material and guidebook.  Patient learns about the [place] for disease risk reduction. The [place] emphasizes a wide variety of unrefined, minimally-processed carbohydrates, like fruits, vegetables, whole grains, and legumes. Go, Caution, and Stop food choices are explained. Plant-based and lean animal proteins are emphasized. Rationale provided for low sodium intake for blood pressure control, low added sugars for blood sugar stabilization,  and low added fats and oils for coronary artery disease risk reduction and weight management.  Calorie Density  Clinical staff conducted group or individual video education with verbal and written material and guidebook.  Patient learns about calorie density and how it impacts the Pritikin Eating Plan. Knowing the characteristics of the food you choose will help you  decide whether those foods will lead to weight gain or weight loss, and whether you want to consume more or less of them. Weight loss is usually a side effect of the Pritikin Eating Plan because of its focus on low calorie-dense foods.  Label Reading  Clinical staff conducted group or individual video education with verbal and written material and guidebook.  Patient learns about the Pritikin recommended label reading guidelines and corresponding recommendations regarding calorie density, added sugars, sodium content, and whole grains.  Dining Out - Part 1  Clinical staff conducted group or individual video education with verbal and written material and guidebook.  Patient learns that restaurant meals can be sabotaging because they can be so high in calories, fat, sodium, and/or sugar. Patient learns recommended strategies on how to positively address this and avoid unhealthy pitfalls.  Facts on Fats  Clinical staff conducted group or individual video education with verbal and written material and guidebook.  Patient learns that lifestyle modifications can be just as effective, if not more so, as many medications for lowering your risk of heart disease. A Pritikin lifestyle can help to reduce your risk of inflammation and atherosclerosis (cholesterol build-up, or plaque, in the artery walls). Lifestyle interventions such as dietary choices and physical activity address the cause of atherosclerosis. A review of the types of fats and their impact on blood cholesterol levels, along with dietary recommendations to reduce fat intake is also  included.  Nutrition Action Plan  Clinical staff conducted group or individual video education with verbal and written material and guidebook.  Patient learns how to incorporate Pritikin recommendations into their lifestyle. Recommendations include planning and keeping personal health goals in mind as an important part of their success.  Healthy Mind-Set    Healthy Minds, Bodies, Hearts  Clinical staff conducted group or individual video education with verbal and written material and guidebook.  Patient learns how to identify when they are stressed. Video will discuss the impact of that stress, as well as the many benefits of stress management. Patient will also be introduced to stress management techniques. The way we think, act, and feel has an impact on our hearts.  How Our Thoughts Can Heal Our Hearts  Clinical staff conducted group or individual video education with verbal and written material and guidebook.  Patient learns that negative thoughts can cause depression and anxiety. This can result in negative lifestyle behavior and serious health problems. Cognitive behavioral therapy is an effective method to help control our thoughts in order to change and improve our emotional outlook.  Additional Videos:  Exercise    Improving Performance  Clinical staff conducted group or individual video education with verbal and written material and guidebook.  Patient learns to use a non-linear approach by alternating intensity levels and lengths of time spent exercising to help burn more calories and lose more body fat. Cardiovascular exercise helps improve heart health, metabolism, hormonal balance, blood sugar control, and recovery from fatigue. Resistance training improves strength, endurance, balance, coordination, reaction time, metabolism, and muscle mass. Flexibility exercise improves circulation, posture, and balance. Seek guidance from your physician and exercise physiologist before  implementing an exercise routine and learn your capabilities and proper form for all exercise.  Introduction to Yoga  Clinical staff conducted group or individual video education with verbal and written material and guidebook.  Patient learns about yoga, a discipline of the coming together of mind, breath, and body. The benefits of yoga include improved flexibility, improved range of motion, better  posture and core strength, increased lung function, weight loss, and positive self-image. Yoga's heart health benefits include lowered blood pressure, healthier heart rate, decreased cholesterol and triglyceride levels, improved immune function, and reduced stress. Seek guidance from your physician and exercise physiologist before implementing an exercise routine and learn your capabilities and proper form for all exercise.  Medical   Aging: Enhancing Your Quality of Life  Clinical staff conducted group or individual video education with verbal and written material and guidebook.  Patient learns key strategies and recommendations to stay in good physical health and enhance quality of life, such as prevention strategies, having an advocate, securing a [place], and keeping a list of medications and system for tracking them. It also discusses how to avoid risk for bone loss.  Biology of Weight Control  Clinical staff conducted group or individual video education with verbal and written material and guidebook.  Patient learns that weight gain occurs because we consume more calories than we burn (eating more, moving less). Even if your body weight is normal, you may have higher ratios of fat compared to muscle mass. Too much body fat puts you at increased risk for cardiovascular disease, heart attack, stroke, type 2 diabetes, and obesity-related cancers. In addition to exercise, following the [place] can help reduce your risk.  Decoding Lab Results  Clinical staff  conducted group or individual video education with verbal and written material and guidebook.  Patient learns that lab test reflects one measurement whose values change over time and are influenced by many factors, including medication, stress, sleep, exercise, food, hydration, pre-existing medical conditions, and more. It is recommended to use the knowledge from this video to become more involved with your lab results and evaluate your numbers to speak with your doctor.   Diseases of Our Time - Overview  Clinical staff conducted group or individual video education with verbal and written material and guidebook.  Patient learns that according to the CDC, 50% to 70% of chronic diseases (such as obesity, type 2 diabetes, elevated lipids, hypertension, and heart disease) are avoidable through lifestyle improvements including healthier food choices, listening to satiety cues, and increased physical activity.  Sleep Disorders Clinical staff conducted group or individual video education with verbal and written material and guidebook.  Patient learns how good quality and duration of sleep are important to overall health and well-being. Patient also learns about sleep disorders and how they impact health along with recommendations to address them, including discussing with a physician.  Nutrition  Dining Out - Part 2 Clinical staff conducted group or individual video education with verbal and written material and guidebook.  Patient learns how to plan ahead and communicate in order to maximize their dining experience in a healthy and nutritious manner. Included are recommended food choices based on the type of restaurant the patient is visiting.   Fueling a Best boy conducted group or individual video education with verbal and written material and guidebook.  There is a strong connection between our food choices and our health. Diseases like obesity and type 2 diabetes are very  prevalent and are in large-part due to lifestyle choices. The Pritikin Eating Plan provides plenty of food and hunger-curbing satisfaction. It is easy to follow, affordable, and helps reduce health risks.  Menu Workshop  Clinical staff conducted group or individual video education with verbal and written material and guidebook.  Patient learns that restaurant meals can sabotage health goals  because they are often packed with calories, fat, sodium, and sugar. Recommendations include strategies to plan ahead and to communicate with the manager, chef, or server to help order a healthier meal.  Planning Your Eating Strategy  Clinical staff conducted group or individual video education with verbal and written material and guidebook.  Patient learns about the [place] and its benefit of reducing the risk of disease. The [place] does not focus on calories. Instead, it emphasizes high-quality, nutrient-rich foods. By knowing the characteristics of the foods, we choose, we can determine their calorie density and make informed decisions.  Targeting Your Nutrition Priorities  Clinical staff conducted group or individual video education with verbal and written material and guidebook.  Patient learns that lifestyle habits have a tremendous impact on disease risk and progression. This video provides eating and physical activity recommendations based on your personal health goals, such as reducing LDL cholesterol, losing weight, preventing or controlling type 2 diabetes, and reducing high blood pressure.  Vitamins and Minerals  Clinical staff conducted group or individual video education with verbal and written material and guidebook.  Patient learns different ways to obtain key vitamins and minerals, including through a recommended healthy diet. It is important to discuss all supplements you take with your doctor.   Healthy Mind-Set    Smoking Cessation  Clinical staff conducted group  or individual video education with verbal and written material and guidebook.  Patient learns that cigarette smoking and tobacco addiction pose a serious health risk which affects millions of people. Stopping smoking will significantly reduce the risk of heart disease, lung disease, and many forms of cancer. Recommended strategies for quitting are covered, including working with your doctor to develop a successful plan.  Culinary   Becoming a Financial trader conducted group or individual video education with verbal and written material and guidebook.  Patient learns that cooking at home can be healthy, cost-effective, quick, and puts them in control. Keys to cooking healthy recipes will include looking at your recipe, assessing your equipment needs, planning ahead, making it simple, choosing cost-effective seasonal ingredients, and limiting the use of added fats, salts, and sugars.  Cooking - Breakfast and Snacks  Clinical staff conducted group or individual video education with verbal and written material and guidebook.  Patient learns how important breakfast is to satiety and nutrition through the entire day. Recommendations include key foods to eat during breakfast to help stabilize blood sugar levels and to prevent overeating at meals later in the day. Planning ahead is also a key component.  Cooking - Human resources officer conducted group or individual video education with verbal and written material and guidebook.  Patient learns eating strategies to improve overall health, including an approach to cook more at home. Recommendations include thinking of animal protein as a side on your plate rather than center stage and focusing instead on lower calorie dense options like vegetables, fruits, whole grains, and plant-based proteins, such as beans. Making sauces in large quantities to freeze for later and leaving the skin on your vegetables are also recommended to maximize  your experience.  Cooking - Healthy Salads and Dressing Clinical staff conducted group or individual video education with verbal and written material and guidebook.  Patient learns that vegetables, fruits, whole grains, and legumes are the foundations of the [place]. Recommendations include how to incorporate each of these in flavorful and healthy salads, and how to create homemade salad dressings.  Proper handling of ingredients is also covered. Cooking - Soups and [employer] - Soups and Desserts Clinical staff conducted group or individual video education with verbal and written material and guidebook.  Patient learns that Pritikin soups and desserts make for easy, nutritious, and delicious snacks and meal components that are low in sodium, fat, sugar, and calorie density, while high in vitamins, minerals, and filling fiber. Recommendations include simple and healthy ideas for soups and desserts.   Overview     The Pritikin Solution Program Overview Clinical staff conducted group or individual video education with verbal and written material and guidebook.  Patient learns that the results of the [place] Program have been documented in more than 100 articles published in peer-reviewed journals, and the benefits include reducing risk factors for (and, in some cases, even reversing) high cholesterol, high blood pressure, type 2 diabetes, obesity, and more! An overview of the three key pillars of the Pritikin Program will be covered: eating well, doing regular exercise, and having a healthy mind-set.  WORKSHOPS  Exercise: Exercise Basics: Building Your Action Plan Clinical staff led group instruction and group discussion with PowerPoint presentation and patient guidebook. To enhance the learning environment the use of posters, models and videos may be added. At the conclusion of this workshop, patients will comprehend the difference between physical activity and exercise, as well  as the benefits of incorporating both, into their routine. Patients will understand the FITT (Frequency, Intensity, Time, and Type) principle and how to use it to build an exercise action plan. In addition, safety concerns and other considerations for exercise and cardiac rehab will be addressed by the presenter. The purpose of this lesson is to promote a comprehensive and effective weekly exercise routine in order to improve patients' overall level of fitness.   Managing Heart Disease: Your Path to a Healthier Heart Clinical staff led group instruction and group discussion with PowerPoint presentation and patient guidebook. To enhance the learning environment the use of posters, models and videos may be added.At the conclusion of this workshop, patients will understand the anatomy and physiology of the heart. Additionally, they will understand how Pritikin's three pillars impact the risk factors, the progression, and the management of heart disease.  The purpose of this lesson is to provide a high-level overview of the heart, heart disease, and how the Pritikin lifestyle positively impacts risk factors.  Exercise Biomechanics Clinical staff led group instruction and group discussion with PowerPoint presentation and patient guidebook. To enhance the learning environment the use of posters, models and videos may be added. Patients will learn how the structural parts of their bodies function and how these functions impact their daily activities, movement, and exercise. Patients will learn how to promote a neutral spine, learn how to manage pain, and identify ways to improve their physical movement in order to promote healthy living. The purpose of this lesson is to expose patients to common physical limitations that impact physical activity. Participants will learn practical ways to adapt and manage aches and pains, and to minimize their effect on regular exercise. Patients will learn how to  maintain good posture while sitting, walking, and lifting.  Balance Training and Fall Prevention  Clinical staff led group instruction and group discussion with PowerPoint presentation and patient guidebook. To enhance the learning environment the use of posters, models and videos may be added. At the conclusion of this workshop, patients will understand the importance of their sensorimotor skills (vision, proprioception, and the vestibular system) in  maintaining their ability to balance as they age. Patients will apply a variety of balancing exercises that are appropriate for their current level of function. Patients will understand the common causes for poor balance, possible solutions to these problems, and ways to modify their physical environment in order to minimize their fall risk. The purpose of this lesson is to teach patients about the importance of maintaining balance as they age and ways to minimize their risk of falling.  WORKSHOPS   Nutrition:  Fueling a Scientist, research (physical sciences) led group instruction and group discussion with PowerPoint presentation and patient guidebook. To enhance the learning environment the use of posters, models and videos may be added. Patients will review the foundational principles of the [place] and understand what constitutes a serving size in each of the food groups. Patients will also learn Pritikin-friendly foods that are better choices when away from home and review make-ahead meal and snack options. Calorie density will be reviewed and applied to three nutrition priorities: weight maintenance, weight loss, and weight gain. The purpose of this lesson is to reinforce (in a group setting) the key concepts around what patients are recommended to eat and how to apply these guidelines when away from home by planning and selecting Pritikin-friendly options. Patients will understand how calorie density may be adjusted for different weight management  goals.  Mindful Eating  Clinical staff led group instruction and group discussion with PowerPoint presentation and patient guidebook. To enhance the learning environment the use of posters, models and videos may be added. Patients will briefly review the concepts of the [place] and the importance of low-calorie dense foods. The concept of mindful eating will be introduced as well as the importance of paying attention to internal hunger signals. Triggers for non-hunger eating and techniques for dealing with triggers will be explored. The purpose of this lesson is to provide patients with the opportunity to review the basic principles of the [place], discuss the value of eating mindfully and how to measure internal cues of hunger and fullness using the Hunger Scale. Patients will also discuss reasons for non-hunger eating and learn strategies to use for controlling emotional eating.  Targeting Your Nutrition Priorities Clinical staff led group instruction and group discussion with PowerPoint presentation and patient guidebook. To enhance the learning environment the use of posters, models and videos may be added. Patients will learn how to determine their genetic susceptibility to disease by reviewing their family history. Patients will gain insight into the importance of diet as part of an overall healthy lifestyle in mitigating the impact of genetics and other environmental insults. The purpose of this lesson is to provide patients with the opportunity to assess their personal nutrition priorities by looking at their family history, their own health history and current risk factors. Patients will also be able to discuss ways of prioritizing and modifying the [place] for their highest risk areas  Menu  Clinical staff led group instruction and group discussion with PowerPoint presentation and patient guidebook. To enhance the learning environment the use of posters,  models and videos may be added. Using menus brought in from [employer], or printed from [employer], patients will apply the [place] dining out guidelines that were presented in the [employer] video. Patients will also be able to practice these guidelines in a variety of provided scenarios. The purpose of this lesson is to provide patients with the opportunity to practice hands-on learning  of the [employer] guidelines with actual menus and practice scenarios.  Label Reading Clinical staff led group instruction and group discussion with PowerPoint presentation and patient guidebook. To enhance the learning environment the use of posters, models and videos may be added. Patients will review and discuss the Pritikin label reading guidelines presented in Pritikin's Label Reading Educational series video. Using fool labels brought in from local grocery stores and markets, patients will apply the label reading guidelines and determine if the packaged food meet the Pritikin guidelines. The purpose of this lesson is to provide patients with the opportunity to review, discuss, and practice hands-on learning of the Pritikin Label Reading guidelines with actual packaged food labels. [place] Workshops are designed to teach patients ways to prepare quick, simple, and affordable recipes at home. The importance of nutrition's role in chronic disease risk reduction is reflected in its emphasis in the overall Pritikin program. By learning how to prepare essential core Pritikin Eating Plan recipes, patients will increase control over what they eat; be able to customize the flavor of foods without the use of added salt, sugar, or fat; and improve the quality of the food they consume. By learning a set of core recipes which are easily assembled, quickly prepared, and affordable, patients are more likely to prepare more healthy foods at home. These workshops  focus on convenient breakfasts, simple entres, side dishes, and desserts which can be prepared with minimal effort and are consistent with nutrition recommendations for cardiovascular risk reduction. Cooking [employer] are taught by a Engineer, materials (RD) who has been trained by the [employer]. The chef or RD has a clear understanding of the importance of minimizing - if not completely eliminating - added fat, sugar, and sodium in recipes. Throughout the series of [place] Workshop sessions, patients will learn about healthy ingredients and efficient methods of cooking to build confidence in their capability to prepare    Cooking School weekly topics:  Adding Flavor- Sodium-Free  Fast and Healthy Breakfasts  Powerhouse Plant-Based Proteins  Satisfying Salads and Dressings  Simple Sides and Sauces  International Cuisine-Spotlight on the [place] Zones  Delicious Desserts  Savory Soups  Efficiency Cooking - Meals in a Snap  Tasty Appetizers and Snacks  Comforting Weekend Breakfasts  One-Pot Wonders   Fast Evening Meals  Easy [place] (Psychosocial): New Thoughts, New Behaviors Clinical staff led group instruction and group discussion with PowerPoint presentation and patient guidebook. To enhance the learning environment the use of posters, models and videos may be added. Patients will learn and practice techniques for developing effective health and lifestyle goals. Patients will be able to effectively apply the goal setting process learned to develop at least one new personal goal.  The purpose of this lesson is to expose patients to a new skill set of behavior modification techniques such as techniques setting SMART goals, overcoming barriers, and achieving new thoughts and new behaviors.  Managing Moods and Relationships Clinical staff led group instruction and group discussion with  PowerPoint presentation and patient guidebook. To enhance the learning environment the use of posters, models and videos may be added. Patients will learn how emotional and chronic stress factors can impact their health and relationships. They will learn healthy ways to manage their moods and utilize positive coping mechanisms. In addition, ICR patients will learn ways to improve communication skills. The purpose of this  lesson is to expose patients to ways of understanding how one's mood and health are intimately connected. Developing a healthy outlook can help build positive relationships and connections with others. Patients will understand the importance of utilizing effective communication skills that include actively listening and being heard. They will learn and understand the importance of the "4 Cs" and especially Connections in fostering of a Healthy Mind-Set.  Healthy Sleep for a Healthy Heart Clinical staff led group instruction and group discussion with PowerPoint presentation and patient guidebook. To enhance the learning environment the use of posters, models and videos may be added. At the conclusion of this workshop, patients will be able to demonstrate knowledge of the importance of sleep to overall health, well-being, and quality of life. They will understand the symptoms of, and treatments for, common sleep disorders. Patients will also be able to identify daytime and nighttime behaviors which impact sleep, and they will be able to apply these tools to help manage sleep-related challenges. The purpose of this lesson is to provide patients with a general overview of sleep and outline the importance of quality sleep. Patients will learn about a few of the most common sleep disorders. Patients will also be introduced to the concept of "sleep hygiene," and discover ways to self-manage certain sleeping problems through simple daily behavior changes. Finally, the workshop will motivate patients by  clarifying the links between quality sleep and their goals of heart-healthy living.   Recognizing and Reducing Stress Clinical staff led group instruction and group discussion with PowerPoint presentation and patient guidebook. To enhance the learning environment the use of posters, models and videos may be added. At the conclusion of this workshop, patients will be able to understand the types of stress reactions, differentiate between acute and chronic stress, and recognize the impact that chronic stress has on their health. They will also be able to apply different coping mechanisms, such as reframing negative self-talk. Patients will have the opportunity to practice a variety of stress management techniques, such as deep abdominal breathing, progressive muscle relaxation, and/or guided imagery.  The purpose of this lesson is to educate patients on the role of stress in their lives and to provide healthy techniques for coping with it.  Learning Barriers/Preferences:  Learning Barriers/Preferences - 05/28/22 1249       Learning Barriers/Preferences   Learning Barriers Sight   wears glasses   Learning Preferences Skilled Demonstration;Individual Instruction             Education Topics:  Knowledge Questionnaire Score:  Knowledge Questionnaire Score - 05/28/22 1248       Knowledge Questionnaire Score   Pre Score 21/24             Core Components/Risk Factors/Patient Goals at Admission:  Personal Goals and Risk Factors at Admission - 05/28/22 1248       Core Components/Risk Factors/Patient Goals on Admission    Weight Management Yes;Obesity    Intervention Weight Management: Develop a combined nutrition and exercise program designed to reach desired caloric intake, while maintaining appropriate intake of nutrient and fiber, sodium and fats, and appropriate energy expenditure required for the weight goal.;Weight Management: Provide education and appropriate resources to help  participant work on and attain dietary goals.;Weight Management/Obesity: Establish reasonable short term and long term weight goals.    Expected Outcomes Short Term: Continue to assess and modify interventions until short term weight is achieved;Long Term: Adherence to nutrition and physical activity/exercise program aimed toward attainment of established weight goal;Understanding  recommendations for meals to include 15-35% energy as protein, 25-35% energy from fat, 35-60% energy from carbohydrates, less than 298m of dietary cholesterol, 20-35 gm of total fiber daily;Understanding of distribution of calorie intake throughout the day with the consumption of 4-5 meals/snacks    Heart Failure Yes    Intervention Provide a combined exercise and nutrition program that is supplemented with education, support and counseling about heart failure. Directed toward relieving symptoms such as shortness of breath, decreased exercise tolerance, and extremity edema.    Expected Outcomes Improve functional capacity of life;Short term: Attendance in program 2-3 days a week with increased exercise capacity. Reported lower sodium intake. Reported increased fruit and vegetable intake. Reports medication compliance.;Short term: Daily weights obtained and reported for increase. Utilizing diuretic protocols set by physician.    Hypertension Yes    Intervention Provide education on lifestyle modifcations including regular physical activity/exercise, weight management, moderate sodium restriction and increased consumption of fresh fruit, vegetables, and low fat dairy, alcohol moderation, and smoking cessation.;Monitor prescription use compliance.    Expected Outcomes Short Term: Continued assessment and intervention until BP is < 140/974mHG in hypertensive participants. < 130/8080mG in hypertensive participants with diabetes, heart failure or chronic kidney disease.;Long Term: Maintenance of blood pressure at goal levels.     Lipids Yes    Intervention Provide education and support for participant on nutrition & aerobic/resistive exercise along with prescribed medications to achieve LDL <31m9mDL >40mg35m Expected Outcomes Short Term: Participant states understanding of desired cholesterol values and is compliant with medications prescribed. Participant is following exercise prescription and nutrition guidelines.;Long Term: Cholesterol controlled with medications as prescribed, with individualized exercise RX and with personalized nutrition plan. Value goals: LDL < 31mg,71m > 40 mg.             Core Components/Risk Factors/Patient Goals Review:    Core Components/Risk Factors/Patient Goals at Discharge (Final Review):    ITP Comments:  ITP Comments     Row Name 05/28/22 1103           ITP Comments Medical director - Traci Fransico Him Introduction to the [facility] / Intensive Cardiac Rehab.  Inital orientation packet reviewed with the patient.                Comments:Participant attended orientation for the cardiac rehabilitation program on  05/28/2022  to perform initial intake and exercise nustep  test preformed due to chronic arthritis pain and deconditioning. Patient introduced to the [facility] education and orientation packet was reviewed. Completed 6-minute walk test, measurements, initial ITP, and exercise prescription. Vital signs stable. Telemetry-normal sinus rhythm, asymptomatic.Emree Locicero Harrell GaveN   Service time was from 1018 to 1225.

## 2022-05-28 NOTE — Progress Notes (Signed)
Cardiac Rehab Medication Review by a Nurse   Does the patient  feel that his/her medications are working for him/her?  yes  Has the patient been experiencing any side effects to the medications prescribed?  no  Does the patient measure his/her own blood pressure or blood glucose at home?  yes   Does the patient have any problems obtaining medications due to transportation or finances?   no  Understanding of regimen: good Understanding of indications: good Potential of compliance: good    Nurse comments: Leslie [name] is taking her medications as prescribed and has a good understanding of what her medications are for. [name] checks her blood pressures once a day.    [name] See [facility] RN 05/28/2022 4:14 PM

## 2022-05-30 ENCOUNTER — Ambulatory Visit: Payer: PPO | Attending: Nurse Practitioner | Admitting: Nurse Practitioner

## 2022-05-30 ENCOUNTER — Encounter: Payer: Self-pay | Admitting: Nurse Practitioner

## 2022-05-30 VITALS — BP 128/72 | HR 69 | Ht 66.0 in | Wt 260.4 lb

## 2022-05-30 DIAGNOSIS — G4733 Obstructive sleep apnea (adult) (pediatric): Secondary | ICD-10-CM

## 2022-05-30 DIAGNOSIS — I5181 Takotsubo syndrome: Secondary | ICD-10-CM | POA: Diagnosis not present

## 2022-05-30 DIAGNOSIS — I340 Nonrheumatic mitral (valve) insufficiency: Secondary | ICD-10-CM

## 2022-05-30 DIAGNOSIS — I351 Nonrheumatic aortic (valve) insufficiency: Secondary | ICD-10-CM | POA: Diagnosis not present

## 2022-05-30 DIAGNOSIS — I21A1 Myocardial infarction type 2: Secondary | ICD-10-CM

## 2022-05-30 DIAGNOSIS — I5042 Chronic combined systolic (congestive) and diastolic (congestive) heart failure: Secondary | ICD-10-CM | POA: Diagnosis not present

## 2022-05-30 DIAGNOSIS — E785 Hyperlipidemia, unspecified: Secondary | ICD-10-CM

## 2022-05-30 DIAGNOSIS — N1831 Chronic kidney disease, stage 3a: Secondary | ICD-10-CM

## 2022-05-30 DIAGNOSIS — I10 Essential (primary) hypertension: Secondary | ICD-10-CM | POA: Diagnosis not present

## 2022-05-30 DIAGNOSIS — I4819 Other persistent atrial fibrillation: Secondary | ICD-10-CM

## 2022-05-30 DIAGNOSIS — E039 Hypothyroidism, unspecified: Secondary | ICD-10-CM

## 2022-05-30 NOTE — Patient Instructions (Signed)
Medication Instructions:  Your physician recommends that you continue on your current medications as directed. Please refer to the Current Medication list given to you today.   *If you need a refill on your cardiac medications before your next appointment, please call your pharmacy*   Lab Work: NONE ordered at this time of appointment   If you have labs (blood work) drawn today and your tests are completely normal, you will receive your results only by: [place] (if you have MyChart) OR A paper copy in the mail If you have any lab test that is abnormal or we need to change your treatment, we will call you to review the results.   Testing/Procedures: NONE ordered at this time of appointment     Follow-Up: At [facility], you and your health needs are our priority.  As part of our continuing mission to provide you with exceptional heart care, we have created designated Provider Care Teams.  These Care Teams include your primary Cardiologist (physician) and Advanced Practice Providers (APPs -  Physician Assistants and Nurse Practitioners) who all work together to provide you with the care you need, when you need it.  We recommend signing up for the patient portal called "MyChart".  Sign up information is provided on this After Visit Summary.  MyChart is used to connect with patients for Virtual Visits (Telemedicine).  Patients are able to view lab/test results, encounter notes, upcoming appointments, etc.  Non-urgent messages can be sent to your provider as well.   To learn more about what you can do with MyChart, go to [email or web address].    Your next appointment:    Keep follow up as planned   Provider:   Sanda Klein, MD     Other Instructions

## 2022-05-30 NOTE — Progress Notes (Addendum)
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   Past Medical History    Past Medical History:  Diagnosis Date   Adenomatous polyp    Arthritis    "knees, legs, fingers" (07/30/2016)   Asthma    Bursitis of left shoulder    "just finished PT" (07/30/2016)   Chest pain    a. 2003 Abnl stress test-->Cath: nonobs CAD.   CHF (congestive heart failure) (HCC)    Chronic bronchitis (HCC)    Chronic kidney disease    stage 3   GERD (gastroesophageal reflux disease)    barrett's esophagus- Dr. Cristina Gong   History of hiatal hernia    Hyperlipidemia    Hypertension    Hypothyroidism    Migraines    "sporatic; at least a few/year" (07/30/2016)   Mitral regurgitation    a. 07/2016 Echo: mild to mod MR;  b. 07/2016 TEE: mild MR.   Moderate aortic insufficiency    a. 07/2016 Echo: EF 55-60%, mod AI;  b. 07/2016 TEE: EF 50-55%, mod AI.   Obesity    s/p lap band surgery 6/10   OSA on CPAP    PAF (paroxysmal atrial fibrillation) ([place])    a. 07/2016 TEE/DCCV: EF 50-55%, mild MR, mod AI, mild to mod TR, neg bubble study-->successful DCCV x1 w/ 120J.   PONV (postoperative nausea and vomiting)    Vitamin D deficiency    Past Surgical History:  Procedure Laterality Date   APPENDECTOMY     incidental   CARDIOVERSION N/A 08/01/2016   Procedure: CARDIOVERSION;  Surgeon: Jerline Pain, MD;  Location: [facility] ENDOSCOPY;  Service: Cardiovascular;  Laterality: N/A;   CARDIOVERSION N/A 06/24/2019   Procedure: CARDIOVERSION;  Surgeon: Sanda Klein, MD;  Location: [place];  Service: Cardiovascular;  Laterality: N/A;   CARDIOVERSION N/A 05/30/2020   Procedure: CARDIOVERSION;  Surgeon: Sanda Klein, MD;  Location: [place] ENDOSCOPY;  Service: Cardiovascular;  Laterality: N/A;   CARDIOVERSION N/A 05/09/2020   Procedure: CARDIOVERSION;  Surgeon: [name], MD;  Location: [place];  Service: Cardiovascular;  Laterality: N/A;   CATARACT EXTRACTION W/ INTRAOCULAR LENS  IMPLANT, BILATERAL Bilateral ~ 2008-2017   left - right   COMBINED HYSTERECTOMY VAGINAL / OOPHORECTOMY / A&P [place]  09/2008   RIGHT/LEFT HEART CATH AND CORONARY ANGIOGRAPHY N/A 04/11/2022   Procedure: RIGHT/LEFT HEART CATH AND CORONARY ANGIOGRAPHY;  Surgeon: Jettie Booze, MD;  Location: [place] CV LAB;  Service: Cardiovascular;  Laterality: N/A;   TEE WITHOUT CARDIOVERSION N/A 08/01/2016   Procedure: TRANSESOPHAGEAL ECHOCARDIOGRAM (TEE);  Surgeon: Jerline Pain, MD;  Location: [place];  Service: Cardiovascular;  Laterality: N/A;   [place]   "w/1 ovary"    Allergies  Allergies  Allergen Reactions   Clindamycin Hcl  Lip and face swelling   Augmentin [Amoxicillin-Pot Clavulanate] Diarrhea    Did it involve swelling of the face/tongue/throat, SOB, or low BP? No Did it involve sudden or severe rash/hives, skin peeling, or any reaction on the inside of your mouth or nose? No Did you need to seek medical attention at a hospital or doctor's office? No When did it last happen?      10 + years If all above answers are "NO", may proceed with cephalosporin use.    Codeine Nausea Only and Other (See Comments)     Hyperactivity   Demerol [Meperidine] Nausea And Vomiting   Sulfa Antibiotics Rash     Labs/Other Studies Reviewed    The following studies were reviewed today: Echo 05/22/2022: IMPRESSIONS    1. Left ventricular ejection fraction, by estimation, is 65 to 70%. The  left ventricle has normal function. The left ventricle has no regional  wall motion abnormalities. Left ventricular diastolic parameters are  consistent with Grade II diastolic  dysfunction (pseudonormalization). Elevated left ventricular end-diastolic  pressure.   2. Right ventricular systolic function is normal. The right ventricular  size is normal. There is normal pulmonary artery systolic pressure.   3. Left atrial size was severely dilated.   4. The mitral valve is normal in structure. Moderate mitral valve  regurgitation. No evidence of mitral stenosis.   5. The aortic valve is tricuspid. Aortic valve regurgitation is moderate.  No aortic stenosis is present. Aortic regurgitation PHT measures 653 msec.   6. The inferior vena cava is normal in size with greater than 50%  respiratory variability, suggesting right atrial pressure of 3 mmHg.    R/LHC 03/2022:   LV end diastolic pressure is mildly elevated.   Hemodynamic findings consistent with mild pulmonary hypertension.   There is no aortic valve stenosis.   On 4L [place]: Ao sat 92%, PA sat 62%, PA pressure 48/20, mean PA pressure 32 mmHg, mean right atrial pressure 8 mmHg, mean pulmonary capillary wedge pressure 15 mmHg, cardiac output 7.09 L/min, cardiac index 3.13.   Mild, nonobstructive coronary artery disease.  Mild pulmonary artery hypertension.  Nonischemic cardiomyopathy.  Continue medical therapy.  Echo 03/2022: IMPRESSIONS    1. Left ventricular ejection fraction, by estimation, is 25 to 30%. The  left ventricle has severely decreased function. The left ventricle  demonstrates regional wall motion abnormalities (see scoring  diagram/findings for  description). There is mild left   ventricular hypertrophy. Left ventricular diastolic parameters are  indeterminate.   2. Right ventricular systolic function is normal. The right ventricular  size is normal. Tricuspid regurgitation signal is inadequate for assessing  PA pressure.   3. A small pericardial effusion is present. The pericardial effusion is  anterior to the right ventricle.   4. The mitral valve is normal in structure. Moderate mitral valve  regurgitation. No evidence of mitral stenosis.   5. The aortic valve is tricuspid. There is mild calcification of the  aortic valve. Aortic valve regurgitation is moderate - incompletely  interrogated. No aortic stenosis is present.   6. The inferior vena cava is dilated in size with >50% respiratory  variability, suggesting right atrial pressure of 8 mmHg.   Recent Labs: 04/09/2022: B Natriuretic Peptide 702.6 04/11/2022: ALT 12; TSH 3.600 04/12/2022: Hemoglobin 10.5; Platelets 302 05/08/2022: BUN 19; Creatinine, Ser 1.13; Potassium 4.2; Sodium 139  Recent Lipid Panel    Component Value Date/Time   CHOL 146 07/31/2016 0435   TRIG 174 (H) 07/31/2016 0435  HDL 32 (L) 07/31/2016 0435   CHOLHDL 4.6 07/31/2016 0435   VLDL 35 07/31/2016 0435   LDLCALC 79 07/31/2016 0435    History of Present Illness    77 year old female with the above past medical history including persistent atrial fibrillation, chronic diastolic heart failure, Takotsubo cardiomyopathy, 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    She was initially diagnosed with atrial fibrillation in April 2018.  She had a difficult time with rate control and underwent TEE guided DCCV.  She underwent repeat DCCV in March 2021 and again in 2022  at which time she was started on flecainide. Following her  cardioversion in February 2022 she developed pulmonary edema and required a brief hospitalization.  Nuclear stress test in February 2022 showed no evidence of ischemia.  Echocardiogram in February 2022 showed EF 55 to 60%, mild mitral valve regurgitation, moderate aortic valve regurgitation, dilated left atrium, normal LVEF. She presented to the ED on 04/09/2022 with shortness of breath, hypertensive urgency with SBP >200. Echocardiogram showed EF 25 to 30%, severely decreased LV function, RWMA, mild LVH, normal RV systolic function, small pericardial effusion, moderate mitral valve regurgitation, moderate aortic valve regurgitation.  Troponin was elevated. Cardiology was consulted and she underwent R/LHC on 04/11/2022 which revealed mild pulmonary hypertension, mild nonobstructive CAD, and ICM. Flecainide and verapamil were transitioned to amiodarone and metoprolol in the setting of reduced EF.  She did not tolerate Entresto initially due to hypotension. It was thought that her acute systolic heart failure was secondary to Takotsubo/stress-induced cardiomyopathy/type II MI in the setting of hypertensive urgency. She was last seen in the office on 04/18/2022 and was stable from a cardiac standpoint.  She had follow-up with the heart failure TOC clinic on 04/30/2022 and was transitioned from losartan to [facility].  Repeat echocardiogram showed EF 65 to 70%, normal LV function, no RWMA, G2 DD, normal RV systolic function, moderate mitral valve regurgitation, moderate aortic valve regurgitation.  She presents today for follow-up.  Her daughter is present by phone.  Since her last visit has been stable from a cardiac standpoint.  She denies any chest pain, dyspnea, edema, PND, orthopnea, weight gain.  BP has been well-controlled.  She does note occasional palpitations.  She starts cardiac rehab next week.  Overall, she reports feeling well.  Home Medications    Current Outpatient Medications  Medication Sig Dispense  Refill   acetaminophen (TYLENOL) 650 MG CR tablet Take 1,300 mg by mouth every 8 (eight) hours as needed for pain.     amiodarone (PACERONE) 200 MG tablet Take 1 tablet (200 mg total) by mouth daily. 90 tablet 3   apixaban (ELIQUIS) 5 MG TABS tablet Take 1 tablet (5 mg total) by mouth 2 (two) times daily. 180 tablet 3   ARNUITY ELLIPTA 50 MCG/ACT AEPB Inhale into the lungs.     atorvastatin (LIPITOR) 40 MG tablet Take 1 tablet (40 mg total) by mouth daily. 90 tablet 3   Bacillus Coagulans-Inulin (ALIGN PREBIOTIC-PROBIOTIC PO) Take 2 capsules by mouth daily. Gummy     cholecalciferol (VITAMIN D) 1000 UNITS tablet Take 1,000 Units by mouth daily.     diclofenac Sodium (VOLTAREN) 1 % GEL Apply 1 application topically 2 (two) times daily.     fexofenadine (ALLEGRA) 180 MG tablet Take 180 mg by mouth daily.     fluticasone (FLONASE) 50 MCG/ACT nasal spray  Place 2 sprays into both nostrils at bedtime.      furosemide (LASIX) 40 MG tablet Take 1 tablet (40 mg total) by mouth as needed. For weight greater than 261 lbs 30 tablet 3   Glucosamine-Chondroitin (OSTEO BI-FLEX REGULAR STRENGTH PO) Take 1 tablet by mouth daily.     levothyroxine (SYNTHROID, LEVOTHROID) 50 MCG tablet Take 50 mcg by mouth daily before breakfast.     magnesium oxide (MAG-OX) 400 (240 Mg) MG tablet Take 1 tablet (400 mg total) by mouth daily. 90 tablet 3   metoprolol succinate (TOPROL-XL) 100 MG 24 hr tablet Take 100 mg (one tablet) in the morning and 50 mg (half a tablet) in the evening. 135 tablet 3   metroNIDAZOLE (METROCREAM) 0.75 % cream Apply 1 application  topically daily as needed.     mometasone (ELOCON) 0.1 % cream Apply 1 application topically 2 (two) times daily as needed (skin irritation).     Multiple Vitamins-Minerals (CENTRUM MULTIGUMMIES) CHEW Chew 1 tablet by mouth daily.     omeprazole (PRILOSEC) 20 MG capsule Take 40 mg by mouth daily before breakfast.     sacubitril-valsartan (ENTRESTO) 49-51 MG Take 1 tablet by  mouth 2 (two) times daily. 60 tablet 11   senna (SENOKOT) 8.6 MG tablet Take 1 tablet by mouth every evening.     No current facility-administered medications for this visit.     Review of Systems    She denies chest pain, dyspnea, pnd, orthopnea, n, v, dizziness, syncope, edema, weight gain, or early satiety. All other systems reviewed and are otherwise negative except as noted above.   Physical Exam    VS:  BP 128/72 (BP Location: Left Arm, Patient Position: Sitting, Cuff Size: Normal)   Pulse 69   Ht 5' 6"$  (1.676 m)   Wt 260 lb 6.4 oz (118.1 kg)   SpO2 98%   BMI 42.03 kg/m   GEN: Well nourished, well developed, in no acute distress. HEENT: normal. Neck: Supple, no JVD, carotid bruits, or masses. Cardiac: RRR, no murmurs, rubs, or gallops. No clubbing, cyanosis, edema.  Radials/DP/PT 2+ and equal bilaterally.  Respiratory:  Respirations regular and unlabored, clear to auscultation bilaterally. GI: Soft, nontender, nondistended, BS + x 4. MS: no deformity or atrophy. Skin: warm and dry, no rash. Neuro:  Strength and sensation are intact. Psych: Normal affect.  Accessory Clinical Findings    ECG personally reviewed by me today - No EKG in office today.    Lab Results  Component Value Date   WBC 8.6 04/12/2022   HGB 10.5 (L) 04/12/2022   HCT 34.8 (L) 04/12/2022   MCV 81.3 04/12/2022   PLT 302 04/12/2022   Lab Results  Component Value Date   CREATININE 1.13 (H) 05/08/2022   BUN 19 05/08/2022   NA 139 05/08/2022   K 4.2 05/08/2022   CL 105 05/08/2022   CO2 24 05/08/2022   Lab Results  Component Value Date   ALT 12 04/11/2022   AST 16 04/11/2022   ALKPHOS 78 04/11/2022   BILITOT 0.7 04/11/2022   Lab Results  Component Value Date   CHOL 146 07/31/2016   HDL 32 (L) 07/31/2016   LDLCALC 79 07/31/2016   TRIG 174 (H) 07/31/2016   CHOLHDL 4.6 07/31/2016    Lab Results  Component Value Date   HGBA1C 5.6 07/31/2016    Assessment & Plan    1. 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  Echo during recent hospitalization showed EF 25 to 30%, severely decreased LV function, RWMA, mild LVH, normal RV systolic function, small pericardial effusion, moderate mitral valve regurgitation, moderate aortic valve regurgitation. Cath with minimal CAD, mild pulmonary hypertension.  Repeat echocardiogram in 05/2022 showed normalization of EF. Stable with no anginal symptoms. Euvolemic and well compensated on exam.  Continue metoprolol, Entresto, Lasix  2. Persistent atrial fibrillation: Maintaining NSR on amiodarone and metoprolol. Verapamil and flecainide were discontinued in the setting of stress cardiomyopathy with reduced EF.  She notes occasional palpitations which she finds "bothersome."  I will discuss with Dr. Sallyanne Kuster ongoing management of atrial fibrillation (patient is asking if she will remain on amiodarone or will restart flecainide/verapamil).  If she remains on amiodarone, consider repeat LFTs, TSH prior to next visit. For now, continue amiodarone, metoprolol, Eliquis.   ADDENDUM 07/12/2022: Per Dr. Sallyanne Kuster, because of the lower side effect profile, will likely switch back to flecainide and verapamil instead of amiodarone.  Because amiodarone requires a longer "washout", would need to discontinue amio for about 4 weeks prior to restarting flecainide.  Okay to switch from current metoprolol prescription to her previous dose of verapamil at any time. Can discuss these changes at next follow-up visit.   3. Valvular heart disease: Recent echo with moderate mitral valve regurgitation, moderate aortic valve regurgitation. Euvolemic and well compensated on exam, stable.    4. Hypertension: BP well controlled. Continue current antihypertensive  regimen.    5. Hyperlipidemia: LDL was 104 in 08/2021.  Monitored and managed per PCP.  Continue Crestor.   6. CKD stage IIIa: Creatinine was stable at 1.13 in 04/2022.     7. Hypothyroidism: TSH was 3.6 in 03/2022.  Monitored and managed per PCP.  Continue levothyroxine.   8. OSA: Adherent to CPAP.  Denies any concerns.   9. Obesity: Encouraged activity as tolerated.  Cardiac rehab next week.   10. Disposition: Follow-up as scheduled with Dr. Sallyanne Kuster in 07/2022.     Lenna Sciara, NP 05/30/2022, 4:29 PM

## 2022-06-03 ENCOUNTER — Encounter (HOSPITAL_COMMUNITY)
Admission: RE | Admit: 2022-06-03 | Discharge: 2022-06-03 | Disposition: A | Discharge status: Home / Self Care | Payer: PPO | Source: Ambulatory Visit | Attending: Cardiovascular Disease | Admitting: Cardiovascular Disease

## 2022-06-03 ENCOUNTER — Other Ambulatory Visit: Payer: Self-pay

## 2022-06-03 DIAGNOSIS — Z5189 Encounter for other specified aftercare: Secondary | ICD-10-CM | POA: Diagnosis not present

## 2022-06-03 DIAGNOSIS — I5181 Takotsubo syndrome: Secondary | ICD-10-CM | POA: Diagnosis not present

## 2022-06-03 DIAGNOSIS — I5022 Chronic systolic (congestive) heart failure: Secondary | ICD-10-CM

## 2022-06-03 MED ORDER — MAGNESIUM OXIDE -MG SUPPLEMENT 400 (240 MG) MG PO TABS: 1.0000 | ORAL_TABLET | Freq: Every day | ORAL | 3 refills | Status: DC

## 2022-06-03 NOTE — Progress Notes (Signed)
Daily Session Note  Patient Details  Name: Leslie [name] MRN: [id number] Date of Birth: 04-07-1946 Referring Provider:   [place] from 05/28/2022 in [facility] [place]  Referring Provider Sanda Klein, MD       Encounter Date: 06/03/2022  Check In:  Session Check In - 06/03/22 1308       Check-In   Supervising physician immediately available to respond to emergencies [facility] - Physician supervision    Physician(s) Christen Bame, NP    Location [facility]    Staff Present Barnet Pall, RN, Milus Glazier, MS, ACSM-CEP, CCRP, Exercise Physiologist;Bailey Pearline Cables, MS, Exercise Physiologist;Olinty Celesta Aver, MS, ACSM-CEP, Exercise Physiologist;Jetta Walker BS, ACSM-CEP, Exercise Physiologist    Virtual Visit No    Medication changes reported     No    Fall or balance concerns reported    No    Tobacco Cessation No Change    Warm-up and Cool-down Performed as group-led instruction    Resistance Training Performed Yes    VAD Patient? No    PAD/SET Patient? No      Pain Assessment   Currently in Pain? Yes    Pain Score 3     Pain Location Knee    Pain Orientation Right;Left    Pain Onset More than a month ago    Multiple Pain Sites No      Pain   Pain Onset More than a month ago    Pain Onset More than a month ago    Pain Onset More than a month ago             Capillary Blood Glucose: No results found for this or any previous visit (from the past 24 hour(s)).   Exercise Prescription Changes - 06/03/22 1500       Response to Exercise   Blood Pressure (Admit) 132/70    Blood Pressure (Exercise) 156/70    Blood Pressure (Exit) 128/64    Heart Rate (Admit) 73 bpm    Heart Rate (Exercise) 101 bpm    Heart Rate (Exit) 79 bpm    Rating of Perceived Exertion (Exercise) 12    Symptoms chronic bilateral knee pain (3/10) on Nustep    Comments Pt's first day in  the [facility]2 program    Duration Progress to 30 minutes of  aerobic without signs/symptoms of physical distress    Intensity THRR unchanged      Progression   Progression Continue to progress workloads to maintain intensity without signs/symptoms of physical distress.    Average METs 2.1      Resistance Training   Training Prescription Yes    Weight 2 lbs    Reps 10-15    Time 10 Minutes      Interval Training   Interval Training No      T5 Nustep   Level 1    SPM 88    Minutes 25    METs 2.1             Social History   Tobacco Use  Smoking Status Former  Smokeless Tobacco Never  Tobacco Comments   07/30/2016 "social smoker only; in college"    Goals Met:  Exercise tolerated well No report of concerns or symptoms today Strength training completed today  Goals Unmet:  Not Applicable  Comments: Pt started cardiac rehab today.  Pt tolerated light exercise without difficulty. VSS, telemetry-Sinus Rhythm, asymptomatic.  Medication list reconciled. Pt denies barriers to medicaiton compliance.  PSYCHOSOCIAL ASSESSMENT:  PHQ-3. Pt exhibits positive coping skills, hopeful outlook with supportive family. No psychosocial needs identified at this time, no psychosocial interventions necessary.    Pt enjoys TV, reading and spending time with her friends circle.   Pt oriented to exercise equipment and routine.    Understanding verbalized. Harrell Gave RN BSN    Dr. Fransico Him is Medical Director for Cardiac Rehab at [facility].

## 2022-06-05 ENCOUNTER — Encounter (HOSPITAL_COMMUNITY)
Admission: RE | Admit: 2022-06-05 | Discharge: 2022-06-05 | Disposition: A | Discharge status: Home / Self Care | Payer: PPO | Source: Ambulatory Visit | Attending: Cardiovascular Disease | Admitting: Cardiovascular Disease

## 2022-06-05 DIAGNOSIS — I5022 Chronic systolic (congestive) heart failure: Secondary | ICD-10-CM | POA: Diagnosis not present

## 2022-06-06 DIAGNOSIS — I10 Essential (primary) hypertension: Secondary | ICD-10-CM | POA: Diagnosis not present

## 2022-06-06 DIAGNOSIS — J4521 Mild intermittent asthma with (acute) exacerbation: Secondary | ICD-10-CM | POA: Diagnosis not present

## 2022-06-06 DIAGNOSIS — E7800 Pure hypercholesterolemia, unspecified: Secondary | ICD-10-CM | POA: Diagnosis not present

## 2022-06-06 DIAGNOSIS — N183 Chronic kidney disease, stage 3 unspecified: Secondary | ICD-10-CM | POA: Diagnosis not present

## 2022-06-06 DIAGNOSIS — K219 Gastro-esophageal reflux disease without esophagitis: Secondary | ICD-10-CM | POA: Diagnosis not present

## 2022-06-06 DIAGNOSIS — E039 Hypothyroidism, unspecified: Secondary | ICD-10-CM | POA: Diagnosis not present

## 2022-06-07 ENCOUNTER — Encounter (HOSPITAL_COMMUNITY)
Admission: RE | Admit: 2022-06-07 | Discharge: 2022-06-07 | Disposition: A | Discharge status: Home / Self Care | Payer: PPO | Source: Ambulatory Visit | Attending: Cardiovascular Disease | Admitting: Cardiovascular Disease

## 2022-06-07 DIAGNOSIS — I5022 Chronic systolic (congestive) heart failure: Secondary | ICD-10-CM

## 2022-06-10 ENCOUNTER — Encounter (HOSPITAL_COMMUNITY)
Admission: RE | Admit: 2022-06-10 | Discharge: 2022-06-10 | Disposition: A | Discharge status: Home / Self Care | Payer: PPO | Source: Ambulatory Visit | Attending: Cardiovascular Disease | Admitting: Cardiovascular Disease

## 2022-06-10 DIAGNOSIS — I5022 Chronic systolic (congestive) heart failure: Secondary | ICD-10-CM

## 2022-06-11 NOTE — Progress Notes (Signed)
Cardiac Individual Treatment Plan  Patient Details  Name: Leslie [name] MRN: [id number] Date of Birth: 10/15/1945 Referring Provider:   [place] from 05/28/2022 in [facility] for Heart, Vascular, & [place]  Referring Provider Sanda Klein, MD       Initial Encounter Date:  [place] from 05/28/2022 in [facility] for Heart, Vascular, & Lung Health  Date 05/28/22       Visit Diagnosis: Heart failure, chronic systolic ([place])  Patient's Home Medications on Admission:  Current Outpatient Medications:    acetaminophen (TYLENOL) 650 MG CR tablet, Take 1,300 mg by mouth every 8 (eight) hours as needed for pain., Disp: , Rfl:    amiodarone (PACERONE) 200 MG tablet, Take 1 tablet (200 mg total) by mouth daily., Disp: 90 tablet, Rfl: 3   apixaban (ELIQUIS) 5 MG TABS tablet, Take 1 tablet (5 mg total) by mouth 2 (two) times daily., Disp: 180 tablet, Rfl: 3   ARNUITY ELLIPTA 50 MCG/ACT AEPB, Inhale into the lungs., Disp: , Rfl:    atorvastatin (LIPITOR) 40 MG tablet, Take 1 tablet (40 mg total) by mouth daily., Disp: 90 tablet, Rfl: 3   Bacillus Coagulans-Inulin (ALIGN PREBIOTIC-PROBIOTIC PO), Take 2 capsules by mouth daily. Gummy, Disp: , Rfl:    cholecalciferol (VITAMIN D) 1000 UNITS tablet, Take 1,000 Units by mouth daily., Disp: , Rfl:    diclofenac Sodium (VOLTAREN) 1 % GEL, Apply 1 application topically 2 (two) times daily., Disp: , Rfl:    fexofenadine (ALLEGRA) 180 MG tablet, Take 180 mg by mouth daily., Disp: , Rfl:    fluticasone (FLONASE) 50 MCG/ACT nasal spray, Place 2 sprays into both nostrils at bedtime. , Disp: , Rfl:    furosemide (LASIX) 40 MG tablet, Take 1 tablet (40 mg total) by mouth as needed. For weight greater than 261 lbs, Disp: 30 tablet, Rfl: 3   Glucosamine-Chondroitin (OSTEO BI-FLEX REGULAR STRENGTH PO), Take 1 tablet by mouth daily., Disp: ,  Rfl:    levothyroxine (SYNTHROID, LEVOTHROID) 50 MCG tablet, Take 50 mcg by mouth daily before breakfast., Disp: , Rfl:    magnesium oxide (MAG-OX) 400 (240 Mg) MG tablet, Take 1 tablet (400 mg total) by mouth daily., Disp: 90 tablet, Rfl: 3   metoprolol succinate (TOPROL-XL) 100 MG 24 hr tablet, Take 100 mg (one tablet) in the morning and 50 mg (half a tablet) in the evening., Disp: 135 tablet, Rfl: 3   metroNIDAZOLE (METROCREAM) 0.75 % cream, Apply 1 application  topically daily as needed., Disp: , Rfl:    mometasone (ELOCON) 0.1 % cream, Apply 1 application topically 2 (two) times daily as needed (skin irritation)., Disp: , Rfl:    Multiple Vitamins-Minerals (CENTRUM MULTIGUMMIES) CHEW, Chew 1 tablet by mouth daily., Disp: , Rfl:    omeprazole (PRILOSEC) 20 MG capsule, Take 40 mg by mouth daily before breakfast., Disp: , Rfl:    sacubitril-valsartan (ENTRESTO) 49-51 MG, Take 1 tablet by mouth 2 (two) times daily., Disp: 60 tablet, Rfl: 11   senna (SENOKOT) 8.6 MG tablet, Take 1 tablet by mouth every evening., Disp: , Rfl:   Past Medical History: Past Medical History:  Diagnosis Date   Adenomatous polyp    Arthritis    "knees, legs, fingers" (07/30/2016)   Asthma    Bursitis of left shoulder    "just finished PT" (07/30/2016)   Chest pain    a. 2003 Abnl stress test-->Cath: nonobs  CAD.   CHF (congestive heart failure) (HCC)    Chronic bronchitis (HCC)    Chronic kidney disease    stage 3   GERD (gastroesophageal reflux disease)    barrett's esophagus- Dr. Cristina Gong   History of hiatal hernia    Hyperlipidemia    Hypertension    Hypothyroidism    Migraines    "sporatic; at least a few/year" (07/30/2016)   Mitral regurgitation    a. 07/2016 Echo: mild to mod MR;  b. 07/2016 TEE: mild MR.   Moderate aortic insufficiency    a. 07/2016 Echo: EF 55-60%, mod AI;  b. 07/2016 TEE: EF 50-55%, mod AI.   Obesity    s/p lap band surgery 6/10   OSA on CPAP    PAF (paroxysmal atrial fibrillation)  ([place])    a. 07/2016 TEE/DCCV: EF 50-55%, mild MR, mod AI, mild to mod TR, neg bubble study-->successful DCCV x1 w/ 120J.   PONV (postoperative nausea and vomiting)    Vitamin D deficiency     Tobacco Use: Social History   Tobacco Use  Smoking Status Former  Smokeless Tobacco Never  Tobacco Comments   07/30/2016 "social smoker only; in college"    Labs: Review Flowsheet  More data exists      Latest Ref Rng & Units 07/31/2016 06/24/2019 05/09/2020 09/09/2020 04/11/2022  Labs for ITP Cardiac and Pulmonary Rehab  Cholestrol 0 - 200 mg/dL 146  - - - -  LDL (calc) 0 - 99 mg/dL 79  - - - -  HDL-C >40 mg/dL 32  - - - -  Trlycerides <150 mg/dL 174  - - - -  Hemoglobin A1c 4.8 - 5.6 % 5.6  - - - -  PH, Arterial 7.35 - 7.45 - - - - 7.412   PCO2 arterial 32 - 48 mmHg - - - - 39.0   Bicarbonate 20.0 - 28.0 mmol/L 20.0 - 28.0 mmol/L - - - - 26.5  27.4  24.9   TCO2 22 - 32 mmol/L 22 - 32 mmol/L - '23  22  19  28  29  26   '$ O2 Saturation % % - - - - 62  62  92     Capillary Blood Glucose: Lab Results  Component Value Date   GLUCAP 135 (H) 09/09/2020     Exercise Target Goals: Exercise Program Goal: Individual exercise prescription set using results from initial 6 min walk test and THRR while considering  patient's activity barriers and safety.   Exercise Prescription Goal: Initial exercise prescription builds to 30-45 minutes a day of aerobic activity, 2-3 days per week.  Home exercise guidelines will be given to patient during program as part of exercise prescription that the participant will acknowledge.  Activity Barriers & Risk Stratification:  Activity Barriers & Cardiac Risk Stratification - 05/28/22 1259       Activity Barriers & Cardiac Risk Stratification   Activity Barriers Arthritis;Neck/Spine Problems;Joint Problems;Deconditioning;Muscular Weakness;Decreased Ventricular Function;Balance Concerns    Cardiac Risk Stratification High             6 Minute Walk:  6  Minute Walk     Row Name 05/28/22 1256         6 Minute Walk   Phase Initial  Nustep used     Distance 1574 feet  Nustep used     Walk Time 6 minutes     # of Rest Breaks 0     MPH 3     METS  2.2     RPE 11     Perceived Dyspnea  0     VO2 Peak 7.1     Symptoms Yes (comment)     Comments Rt shoulder apin 6/10; right knee pain 6/10; left knee pain 5/10; left shoulder pain 4/10; left foot pain 5/10     Resting HR 68 bpm     Resting BP 110/64     Resting Oxygen Saturation  98 %     Exercise Oxygen Saturation  during 6 min walk 97 %     Max Ex. HR 100 bpm     Max Ex. BP 114/74     2 Minute Post BP 104/70              Oxygen Initial Assessment:   Oxygen Re-Evaluation:   Oxygen Discharge (Final Oxygen Re-Evaluation):   Initial Exercise Prescription:  Initial Exercise Prescription - 05/28/22 1300       Date of Initial Exercise RX and Referring Provider   Date 05/28/22    Referring Provider Sanda Klein, MD    Expected Discharge Date 08/09/22      T5 Nustep   Level 1    SPM 75    Minutes 25    METs 2      Prescription Details   Frequency (times per week) 3    Duration Progress to 30 minutes of continuous aerobic without signs/symptoms of physical distress      Intensity   THRR 40-80% of Max Heartrate 58 - 115    Ratings of Perceived Exertion 11-13    Perceived Dyspnea 0-4      Progression   Progression Continue progressive overload as per policy without signs/symptoms or physical distress.      Resistance Training   Training Prescription Yes    Weight 2 lbs    Reps 10-15             Perform Capillary Blood Glucose checks as needed.  Exercise Prescription Changes:   Exercise Prescription Changes     Row Name 06/03/22 1500             Response to Exercise   Blood Pressure (Admit) 132/70       Blood Pressure (Exercise) 156/70       Blood Pressure (Exit) 128/64       Heart Rate (Admit) 73 bpm       Heart Rate (Exercise) 101 bpm        Heart Rate (Exit) 79 bpm       Rating of Perceived Exertion (Exercise) 12       Symptoms chronic bilateral knee pain (3/10) on Nustep       Comments Pt's first day in the CRP2 program       Duration Progress to 30 minutes of  aerobic without signs/symptoms of physical distress       Intensity THRR unchanged         Progression   Progression Continue to progress workloads to maintain intensity without signs/symptoms of physical distress.       Average METs 2.1         Resistance Training   Training Prescription Yes       Weight 2 lbs       Reps 10-15       Time 10 Minutes         Interval Training   Interval Training No         T5 Nustep  Level 1       SPM 88       Minutes 25       METs 2.1                Exercise Comments:   Exercise Comments     Row Name 06/03/22 1550           Exercise Comments Pt's first day in the [facility] program. Pt had no complaints other than chronic bilateral knee pain                Exercise Goals and Review:   Exercise Goals     Row Name 05/28/22 1259             Exercise Goals   Increase Physical Activity Yes       Intervention Provide advice, education, support and counseling about physical activity/exercise needs.;Develop an individualized exercise prescription for aerobic and resistive training based on initial evaluation findings, risk stratification, comorbidities and participant's personal goals.       Expected Outcomes Short Term: Attend rehab on a regular basis to increase amount of physical activity.;Long Term: Add in home exercise to make exercise part of routine and to increase amount of physical activity.;Long Term: Exercising regularly at least 3-5 days a week.       Increase Strength and Stamina Yes       Intervention Provide advice, education, support and counseling about physical activity/exercise needs.;Develop an individualized exercise prescription for aerobic and resistive training based on initial  evaluation findings, risk stratification, comorbidities and participant's personal goals.       Expected Outcomes Short Term: Increase workloads from initial exercise prescription for resistance, speed, and METs.;Short Term: Perform resistance training exercises routinely during rehab and add in resistance training at home;Long Term: Improve cardiorespiratory fitness, muscular endurance and strength as measured by increased METs and functional capacity (6MWT)       Able to understand and use rate of perceived exertion (RPE) scale Yes       Intervention Provide education and explanation on how to use RPE scale       Expected Outcomes Short Term: Able to use RPE daily in rehab to express subjective intensity level;Long Term:  Able to use RPE to guide intensity level when exercising independently       Knowledge and understanding of Target Heart Rate Range (THRR) Yes       Intervention Provide education and explanation of THRR including how the numbers were predicted and where they are located for reference       Expected Outcomes Short Term: Able to state/look up THRR;Short Term: Able to use daily as guideline for intensity in rehab;Long Term: Able to use THRR to govern intensity when exercising independently       Understanding of Exercise Prescription Yes       Intervention Provide education, explanation, and written materials on patient's individual exercise prescription       Expected Outcomes Short Term: Able to explain program exercise prescription;Long Term: Able to explain home exercise prescription to exercise independently                Exercise Goals Re-Evaluation :  Exercise Goals Re-Evaluation     Row Name 06/03/22 1548             Exercise Goal Re-Evaluation   Exercise Goals Review Increase Physical Activity;Increase Strength and Stamina;Able to understand and use rate of perceived exertion (RPE) scale;Knowledge and understanding of Target  Heart Rate Range  (THRR);Understanding of Exercise Prescription       Comments Pt's first day in the [facility] program. Pt understands the exericse Rx, THRR, and RPE.       Expected Outcomes Will continue to monitor patient and progress exercise workloads as tolerated.                Discharge Exercise Prescription (Final Exercise Prescription Changes):  Exercise Prescription Changes - 06/03/22 1500       Response to Exercise   Blood Pressure (Admit) 132/70    Blood Pressure (Exercise) 156/70    Blood Pressure (Exit) 128/64    Heart Rate (Admit) 73 bpm    Heart Rate (Exercise) 101 bpm    Heart Rate (Exit) 79 bpm    Rating of Perceived Exertion (Exercise) 12    Symptoms chronic bilateral knee pain (3/10) on Nustep    Comments Pt's first day in the CRP2 program    Duration Progress to 30 minutes of  aerobic without signs/symptoms of physical distress    Intensity THRR unchanged      Progression   Progression Continue to progress workloads to maintain intensity without signs/symptoms of physical distress.    Average METs 2.1      Resistance Training   Training Prescription Yes    Weight 2 lbs    Reps 10-15    Time 10 Minutes      Interval Training   Interval Training No      T5 Nustep   Level 1    SPM 88    Minutes 25    METs 2.1             Nutrition:  Target Goals: Understanding of nutrition guidelines, daily intake of sodium '1500mg'$ , cholesterol '200mg'$ , calories 30% from fat and 7% or less from saturated fats, daily to have 5 or more servings of fruits and vegetables.  Biometrics:  Pre Biometrics - 05/28/22 1035       Pre Biometrics   Waist Circumference 48 inches    Hip Circumference 55.5 inches    Waist to Hip Ratio 0.86 %    Triceps Skinfold 46 mm    % Body Fat 55.2 %    Grip Strength 20 kg    Flexibility 11.25 in    Single Leg Stand 1.85 seconds              Nutrition Therapy Plan and Nutrition Goals:  Nutrition Therapy & Goals - 06/03/22 1432        Nutrition Therapy   Diet Heart healthy Diet    Drug/Food Interactions Statins/Certain Fruits      Personal Nutrition Goals   Nutrition Goal Patient to identify strategies for reducing cardiovascular risk by attending the weekly Pritikin education and nutrition series    Personal Goal #2 Patient to improve diet quality by using the plate method as a daily guide for meal planning to include lean protein/plant protein, fruits, vegetables, whole grains, nonfat dairy as part of well balanced diet    Personal Goal #3 Patient to limit sodium to '1500mg'$  per day    Comments [name] will benefit from partication in intensive cardiac rehab for nutrition, exercise, and lifestyle modification.      Intervention Plan   Intervention Prescribe, educate and counsel regarding individualized specific dietary modifications aiming towards targeted core components such as weight, hypertension, lipid management, diabetes, heart failure and other comorbidities.;Nutrition handout(s) given to patient.    Expected Outcomes Short Term Goal:  Understand basic principles of dietary content, such as calories, fat, sodium, cholesterol and nutrients.;Long Term Goal: Adherence to prescribed nutrition plan.             Nutrition Assessments:  Nutrition Assessments - 06/03/22 1434       Rate Your Plate Scores   Pre Score 64            MEDIFICTS Score Key: ?70 Need to make dietary changes  40-70 Heart Healthy Diet ? 40 Therapeutic Level Cholesterol Diet   Flowsheet Row INTENSIVE CARDIAC REHAB from 06/03/2022 in [facility] for Heart, Vascular, & Lung Health  Picture Your Plate Total Score on Admission 64      Picture Your Plate Scores: [id number] Unhealthy dietary pattern with much room for improvement. 41-50 Dietary pattern unlikely to meet recommendations for good health and room for improvement. 51-60 More healthful dietary pattern, with some room for improvement.  >60 Healthy dietary  pattern, although there may be some specific behaviors that could be improved.    Nutrition Goals Re-Evaluation:  Nutrition Goals Re-Evaluation     [place] Name 06/03/22 1432             Goals   Current Weight 262 lb 2 oz (118.9 kg)       Comment GFR 51, Cr 1.13, A1c 5.9, triglycerides 200, LDL 104       Expected Outcome [name] will benefit from partication in intensive cardiac rehab for nutrition, exercise, and lifestyle modification.                Nutrition Goals Re-Evaluation:  Nutrition Goals Re-Evaluation     [place] Name 06/03/22 1432             Goals   Current Weight 262 lb 2 oz (118.9 kg)       Comment GFR 51, Cr 1.13, A1c 5.9, triglycerides 200, LDL 104       Expected Outcome [name] will benefit from partication in intensive cardiac rehab for nutrition, exercise, and lifestyle modification.                Nutrition Goals Discharge (Final Nutrition Goals Re-Evaluation):  Nutrition Goals Re-Evaluation - 06/03/22 1432       Goals   Current Weight 262 lb 2 oz (118.9 kg)    Comment GFR 51, Cr 1.13, A1c 5.9, triglycerides 200, LDL 104    Expected Outcome [name] will benefit from partication in intensive cardiac rehab for nutrition, exercise, and lifestyle modification.             Psychosocial: Target Goals: Acknowledge presence or absence of significant depression and/or stress, maximize coping skills, provide positive support system. Participant is able to verbalize types and ability to use techniques and skills needed for reducing stress and depression.  Initial Review & Psychosocial Screening:  Initial Psych Review & Screening - 05/28/22 1622       Initial Review   Current issues with Current Sleep Concerns;Current Anxiety/Panic      Family Dynamics   Good Support System? Yes   [name] lives alone she has friends who live in the area and a daughter for support who lives in [place]     Barriers   Psychosocial barriers to participate in program The  patient should benefit from training in stress management and relaxation.      Screening Interventions   Interventions Encouraged to exercise             Quality of Life  Scores:  Quality of Life - 05/28/22 1248       Quality of Life   Select Quality of Life      Quality of Life Scores   Health/Function Pre 21.39 %    Socioeconomic Pre 30 %    Psych/Spiritual Pre 23.79 %    Family Pre 27 %    GLOBAL Pre 24.32 %            Scores of 19 and below usually indicate a poorer quality of life in these areas.  A difference of  2-3 points is a clinically meaningful difference.  A difference of 2-3 points in the total score of the Quality of Life Index has been associated with significant improvement in overall quality of life, self-image, physical symptoms, and general health in studies assessing change in quality of life.  PHQ-9: Review Flowsheet       05/28/2022 02/22/2022  Depression screen PHQ 2/9  Decreased Interest 0 0  Down, Depressed, Hopeless 0 0  PHQ - 2 Score 0 0  Altered sleeping 2 -  Tired, decreased energy 1 -  Change in appetite 0 -  Feeling bad or failure about yourself  0 -  Trouble concentrating 0 -  Moving slowly or fidgety/restless 0 -  Suicidal thoughts 0 -  PHQ-9 Score 3 -  Difficult doing work/chores Not difficult at all -   Interpretation of Total Score  Total Score Depression Severity:  1-4 = Minimal depression, 5-9 = Mild depression, 10-14 = Moderate depression, 15-19 = Moderately severe depression, 20-27 = Severe depression   Psychosocial Evaluation and Intervention:   Psychosocial Re-Evaluation:  Psychosocial Re-Evaluation     [place] Name 06/06/22 1606             Psychosocial Re-Evaluation   Current issues with Current Stress Concerns;Current Anxiety/Panic       Comments [name] started intensive cardiac rehab on 06/03/22 and did not voice any increased concerns or stressors on her first day of exercise.[name] admits to expereincing  some anxiety due to her recent cardiac event/ diagnosis       Expected Outcomes [name] will have decreased or controlled anxiety upon completion of intensive cardiac rehab       Interventions Stress management education;Encouraged to attend Cardiac Rehabilitation for the exercise;Relaxation education       Continue Psychosocial Services  Follow up required by staff         Initial Review   Source of Stress Concerns Chronic Illness       Comments Will continue to monitor and offer support as needed.                Psychosocial Discharge (Final Psychosocial Re-Evaluation):  Psychosocial Re-Evaluation - 06/06/22 1606       Psychosocial Re-Evaluation   Current issues with Current Stress Concerns;Current Anxiety/Panic    Comments [name] started intensive cardiac rehab on 06/03/22 and did not voice any increased concerns or stressors on her first day of exercise.[name] admits to expereincing some anxiety due to her recent cardiac event/ diagnosis    Expected Outcomes [name] will have decreased or controlled anxiety upon completion of intensive cardiac rehab    Interventions Stress management education;Encouraged to attend Cardiac Rehabilitation for the exercise;Relaxation education    Continue Psychosocial Services  Follow up required by staff      Initial Review   Source of Stress Concerns Chronic Illness    Comments Will continue to monitor and offer support as needed.  Vocational Rehabilitation: Provide vocational rehab assistance to qualifying candidates.   Vocational Rehab Evaluation & Intervention:  Vocational Rehab - 05/28/22 1624       Initial Vocational Rehab Evaluation & Intervention   Assessment shows need for Vocational Rehabilitation No   Carlyon Prows is retired and does not need vocational rehab at this time            Education: Education Goals: Education classes will be provided on a weekly basis, covering required topics. Participant will state  understanding/return demonstration of topics presented.    Education     Row Name 06/03/22 1500     Education   Cardiac Education Topics Pritikin   Academic librarian Exercise Education   Exercise Education Biomechanial Limitations   Instruction Review Code 1- Verbalizes Understanding   Class Start Time 1406   Class Stop Time 1442   Class Time Calculation (min) 36 min    [place] Name 06/05/22 1600     Education   Cardiac Education Topics Pritikin   Financial trader   Weekly Topic Fast Evening Meals   Instruction Review Code 1- Verbalizes Understanding   Class Start Time 1400   Class Stop Time 1445   Class Time Calculation (min) 45 min    [place] Name 06/10/22 1500     Education   Cardiac Education Topics Pritikin   Financial trader   Weekly Topic International Cuisine- [employer] on the [place]   Instruction Review Code 1- Verbalizes Understanding   Class Start Time 1405   Class Stop Time 1453   Class Time Calculation (min) 48 min            Core Videos: Exercise    Move It!  Clinical staff conducted group or individual video education with verbal and written material and guidebook.  Patient learns the recommended Pritikin exercise program. Exercise with the goal of living a long, healthy life. Some of the health benefits of exercise include controlled diabetes, healthier blood pressure levels, improved cholesterol levels, improved heart and lung capacity, improved sleep, and better body composition. Everyone should speak with their doctor before starting or changing an exercise routine.  Biomechanical Limitations Clinical staff conducted group or individual video education with verbal and written material and guidebook.  Patient learns how biomechanical limitations can impact exercise and how we can mitigate and  possibly overcome limitations to have an impactful and balanced exercise routine.  Body Composition Clinical staff conducted group or individual video education with verbal and written material and guidebook.  Patient learns that body composition (ratio of muscle mass to fat mass) is a key component to assessing overall fitness, rather than body weight alone. Increased fat mass, especially visceral belly fat, can put [place] at increased risk for metabolic syndrome, type 2 diabetes, heart disease, and even death. It is recommended to combine diet and exercise (cardiovascular and resistance training) to improve your body composition. Seek guidance from your physician and exercise physiologist before implementing an exercise routine.  Exercise Action Plan Clinical staff conducted group or individual video education with verbal and written material and guidebook.  Patient learns the recommended strategies to achieve and enjoy long-term exercise adherence, including variety, self-motivation, self-efficacy, and positive decision making. Benefits of exercise include fitness, good health, weight management, more energy, better sleep,  less stress, and overall well-being.  Medical   Heart Disease Risk Reduction Clinical staff conducted group or individual video education with verbal and written material and guidebook.  Patient learns our heart is our most vital organ as it circulates oxygen, nutrients, white blood cells, and hormones throughout the entire body, and carries waste away. Data supports a plant-based eating plan like the Pritikin Program for its effectiveness in slowing progression of and reversing heart disease. The video provides a number of recommendations to address heart disease.   Metabolic Syndrome and Belly Fat  Clinical staff conducted group or individual video education with verbal and written material and guidebook.  Patient learns what metabolic syndrome is, how it leads to heart disease,  and how one can reverse it and keep it from coming back. You have metabolic syndrome if you have 3 of the following 5 criteria: abdominal obesity, high blood pressure, high triglycerides, low HDL cholesterol, and high blood sugar.  Hypertension and Heart Disease Clinical staff conducted group or individual video education with verbal and written material and guidebook.  Patient learns that high blood pressure, or hypertension, is very common in the [place]. Hypertension is largely due to excessive salt intake, but other important risk factors include being overweight, physical inactivity, drinking too much alcohol, smoking, and not eating enough potassium from fruits and vegetables. High blood pressure is a leading risk factor for heart attack, stroke, congestive heart failure, dementia, kidney failure, and premature death. Long-term effects of excessive salt intake include stiffening of the arteries and thickening of heart muscle and organ damage. Recommendations include ways to reduce hypertension and the risk of heart disease.  Diseases of Our Time - Focusing on Diabetes Clinical staff conducted group or individual video education with verbal and written material and guidebook.  Patient learns why the best way to stop diseases of our time is prevention, through food and other lifestyle changes. Medicine (such as prescription pills and surgeries) is often only a Band-Aid on the problem, not a long-term solution. Most common diseases of our time include obesity, type 2 diabetes, hypertension, heart disease, and cancer. The Pritikin Program is recommended and has been proven to help reduce, reverse, and/or prevent the damaging effects of metabolic syndrome.  Nutrition   Overview of the Pritikin Eating Plan  Clinical staff conducted group or individual video education with verbal and written material and guidebook.  Patient learns about the [place] for disease risk reduction. The  [place] emphasizes a wide variety of unrefined, minimally-processed carbohydrates, like fruits, vegetables, whole grains, and legumes. Go, Caution, and Stop food choices are explained. Plant-based and lean animal proteins are emphasized. Rationale provided for low sodium intake for blood pressure control, low added sugars for blood sugar stabilization, and low added fats and oils for coronary artery disease risk reduction and weight management.  Calorie Density  Clinical staff conducted group or individual video education with verbal and written material and guidebook.  Patient learns about calorie density and how it impacts the Pritikin Eating Plan. Knowing the characteristics of the food you choose will help you decide whether those foods will lead to weight gain or weight loss, and whether you want to consume more or less of them. Weight loss is usually a side effect of the Pritikin Eating Plan because of its focus on low calorie-dense foods.  Label Reading  Clinical staff conducted group or individual video education with verbal and written material and guidebook.  Patient learns about  the Pritikin recommended label reading guidelines and corresponding recommendations regarding calorie density, added sugars, sodium content, and whole grains.  Dining Out - Part 1  Clinical staff conducted group or individual video education with verbal and written material and guidebook.  Patient learns that restaurant meals can be sabotaging because they can be so high in calories, fat, sodium, and/or sugar. Patient learns recommended strategies on how to positively address this and avoid unhealthy pitfalls.  Facts on Fats  Clinical staff conducted group or individual video education with verbal and written material and guidebook.  Patient learns that lifestyle modifications can be just as effective, if not more so, as many medications for lowering your risk of heart disease. A Pritikin lifestyle can  help to reduce your risk of inflammation and atherosclerosis (cholesterol build-up, or plaque, in the artery walls). Lifestyle interventions such as dietary choices and physical activity address the cause of atherosclerosis. A review of the types of fats and their impact on blood cholesterol levels, along with dietary recommendations to reduce fat intake is also included.  Nutrition Action Plan  Clinical staff conducted group or individual video education with verbal and written material and guidebook.  Patient learns how to incorporate Pritikin recommendations into their lifestyle. Recommendations include planning and keeping personal health goals in mind as an important part of their success.  Healthy Mind-Set    Healthy Minds, Bodies, Hearts  Clinical staff conducted group or individual video education with verbal and written material and guidebook.  Patient learns how to identify when they are stressed. Video will discuss the impact of that stress, as well as the many benefits of stress management. Patient will also be introduced to stress management techniques. The way we think, act, and feel has an impact on our hearts.  How Our Thoughts Can Heal Our Hearts  Clinical staff conducted group or individual video education with verbal and written material and guidebook.  Patient learns that negative thoughts can cause depression and anxiety. This can result in negative lifestyle behavior and serious health problems. Cognitive behavioral therapy is an effective method to help control our thoughts in order to change and improve our emotional outlook.  Additional Videos:  Exercise    Improving Performance  Clinical staff conducted group or individual video education with verbal and written material and guidebook.  Patient learns to use a non-linear approach by alternating intensity levels and lengths of time spent exercising to help burn more calories and lose more body fat. Cardiovascular exercise  helps improve heart health, metabolism, hormonal balance, blood sugar control, and recovery from fatigue. Resistance training improves strength, endurance, balance, coordination, reaction time, metabolism, and muscle mass. Flexibility exercise improves circulation, posture, and balance. Seek guidance from your physician and exercise physiologist before implementing an exercise routine and learn your capabilities and proper form for all exercise.  Introduction to Yoga  Clinical staff conducted group or individual video education with verbal and written material and guidebook.  Patient learns about yoga, a discipline of the coming together of mind, breath, and body. The benefits of yoga include improved flexibility, improved range of motion, better posture and core strength, increased lung function, weight loss, and positive self-image. Yoga's heart health benefits include lowered blood pressure, healthier heart rate, decreased cholesterol and triglyceride levels, improved immune function, and reduced stress. Seek guidance from your physician and exercise physiologist before implementing an exercise routine and learn your capabilities and proper form for all exercise.  Medical   Aging: Enhancing Your Quality of  Life  Clinical staff conducted group or individual video education with verbal and written material and guidebook.  Patient learns key strategies and recommendations to stay in good physical health and enhance quality of life, such as prevention strategies, having an advocate, securing a [place], and keeping a list of medications and system for tracking them. It also discusses how to avoid risk for bone loss.  Biology of Weight Control  Clinical staff conducted group or individual video education with verbal and written material and guidebook.  Patient learns that weight gain occurs because we consume more calories than we burn (eating more, moving less). Even if  your body weight is normal, you may have higher ratios of fat compared to muscle mass. Too much body fat puts you at increased risk for cardiovascular disease, heart attack, stroke, type 2 diabetes, and obesity-related cancers. In addition to exercise, following the [place] can help reduce your risk.  Decoding Lab Results  Clinical staff conducted group or individual video education with verbal and written material and guidebook.  Patient learns that lab test reflects one measurement whose values change over time and are influenced by many factors, including medication, stress, sleep, exercise, food, hydration, pre-existing medical conditions, and more. It is recommended to use the knowledge from this video to become more involved with your lab results and evaluate your numbers to speak with your doctor.   Diseases of Our Time - Overview  Clinical staff conducted group or individual video education with verbal and written material and guidebook.  Patient learns that according to the CDC, 50% to 70% of chronic diseases (such as obesity, type 2 diabetes, elevated lipids, hypertension, and heart disease) are avoidable through lifestyle improvements including healthier food choices, listening to satiety cues, and increased physical activity.  Sleep Disorders Clinical staff conducted group or individual video education with verbal and written material and guidebook.  Patient learns how good quality and duration of sleep are important to overall health and well-being. Patient also learns about sleep disorders and how they impact health along with recommendations to address them, including discussing with a physician.  Nutrition  Dining Out - Part 2 Clinical staff conducted group or individual video education with verbal and written material and guidebook.  Patient learns how to plan ahead and communicate in order to maximize their dining experience in a healthy and nutritious manner.  Included are recommended food choices based on the type of restaurant the patient is visiting.   Fueling a Best boy conducted group or individual video education with verbal and written material and guidebook.  There is a strong connection between our food choices and our health. Diseases like obesity and type 2 diabetes are very prevalent and are in large-part due to lifestyle choices. The Pritikin Eating Plan provides plenty of food and hunger-curbing satisfaction. It is easy to follow, affordable, and helps reduce health risks.  Menu Workshop  Clinical staff conducted group or individual video education with verbal and written material and guidebook.  Patient learns that restaurant meals can sabotage health goals because they are often packed with calories, fat, sodium, and sugar. Recommendations include strategies to plan ahead and to communicate with the manager, chef, or server to help order a healthier meal.  Planning Your Eating Strategy  Clinical staff conducted group or individual video education with verbal and written material and guidebook.  Patient learns about the [place] and its benefit of reducing  the risk of disease. The [place] does not focus on calories. Instead, it emphasizes high-quality, nutrient-rich foods. By knowing the characteristics of the foods, we choose, we can determine their calorie density and make informed decisions.  Targeting Your Nutrition Priorities  Clinical staff conducted group or individual video education with verbal and written material and guidebook.  Patient learns that lifestyle habits have a tremendous impact on disease risk and progression. This video provides eating and physical activity recommendations based on your personal health goals, such as reducing LDL cholesterol, losing weight, preventing or controlling type 2 diabetes, and reducing high blood pressure.  Vitamins and Minerals  Clinical staff  conducted group or individual video education with verbal and written material and guidebook.  Patient learns different ways to obtain key vitamins and minerals, including through a recommended healthy diet. It is important to discuss all supplements you take with your doctor.   Healthy Mind-Set    Smoking Cessation  Clinical staff conducted group or individual video education with verbal and written material and guidebook.  Patient learns that cigarette smoking and tobacco addiction pose a serious health risk which affects millions of people. Stopping smoking will significantly reduce the risk of heart disease, lung disease, and many forms of cancer. Recommended strategies for quitting are covered, including working with your doctor to develop a successful plan.  Culinary   Becoming a Financial trader conducted group or individual video education with verbal and written material and guidebook.  Patient learns that cooking at home can be healthy, cost-effective, quick, and puts them in control. Keys to cooking healthy recipes will include looking at your recipe, assessing your equipment needs, planning ahead, making it simple, choosing cost-effective seasonal ingredients, and limiting the use of added fats, salts, and sugars.  Cooking - Breakfast and Snacks  Clinical staff conducted group or individual video education with verbal and written material and guidebook.  Patient learns how important breakfast is to satiety and nutrition through the entire day. Recommendations include key foods to eat during breakfast to help stabilize blood sugar levels and to prevent overeating at meals later in the day. Planning ahead is also a key component.  Cooking - Human resources officer conducted group or individual video education with verbal and written material and guidebook.  Patient learns eating strategies to improve overall health, including an approach to cook more at home.  Recommendations include thinking of animal protein as a side on your plate rather than center stage and focusing instead on lower calorie dense options like vegetables, fruits, whole grains, and plant-based proteins, such as beans. Making sauces in large quantities to freeze for later and leaving the skin on your vegetables are also recommended to maximize your experience.  Cooking - Healthy Salads and Dressing Clinical staff conducted group or individual video education with verbal and written material and guidebook.  Patient learns that vegetables, fruits, whole grains, and legumes are the foundations of the [place]. Recommendations include how to incorporate each of these in flavorful and healthy salads, and how to create homemade salad dressings. Proper handling of ingredients is also covered. Cooking - Soups and [employer] - Soups and Desserts Clinical staff conducted group or individual video education with verbal and written material and guidebook.  Patient learns that Pritikin soups and desserts make for easy, nutritious, and delicious snacks and meal components that are low in sodium, fat, sugar, and calorie density, while high in vitamins, minerals,  and filling fiber. Recommendations include simple and healthy ideas for soups and desserts.   Overview     The Pritikin Solution Program Overview Clinical staff conducted group or individual video education with verbal and written material and guidebook.  Patient learns that the results of the [place] Program have been documented in more than 100 articles published in peer-reviewed journals, and the benefits include reducing risk factors for (and, in some cases, even reversing) high cholesterol, high blood pressure, type 2 diabetes, obesity, and more! An overview of the three key pillars of the Pritikin Program will be covered: eating well, doing regular exercise, and having a healthy mind-set.  WORKSHOPS   Exercise: Exercise Basics: Building Your Action Plan Clinical staff led group instruction and group discussion with PowerPoint presentation and patient guidebook. To enhance the learning environment the use of posters, models and videos may be added. At the conclusion of this workshop, patients will comprehend the difference between physical activity and exercise, as well as the benefits of incorporating both, into their routine. Patients will understand the FITT (Frequency, Intensity, Time, and Type) principle and how to use it to build an exercise action plan. In addition, safety concerns and other considerations for exercise and cardiac rehab will be addressed by the presenter. The purpose of this lesson is to promote a comprehensive and effective weekly exercise routine in order to improve patients' overall level of fitness.   Managing Heart Disease: Your Path to a Healthier Heart Clinical staff led group instruction and group discussion with PowerPoint presentation and patient guidebook. To enhance the learning environment the use of posters, models and videos may be added.At the conclusion of this workshop, patients will understand the anatomy and physiology of the heart. Additionally, they will understand how Pritikin's three pillars impact the risk factors, the progression, and the management of heart disease.  The purpose of this lesson is to provide a high-level overview of the heart, heart disease, and how the Pritikin lifestyle positively impacts risk factors.  Exercise Biomechanics Clinical staff led group instruction and group discussion with PowerPoint presentation and patient guidebook. To enhance the learning environment the use of posters, models and videos may be added. Patients will learn how the structural parts of their bodies function and how these functions impact their daily activities, movement, and exercise. Patients will learn how to promote a neutral spine, learn how  to manage pain, and identify ways to improve their physical movement in order to promote healthy living. The purpose of this lesson is to expose patients to common physical limitations that impact physical activity. Participants will learn practical ways to adapt and manage aches and pains, and to minimize their effect on regular exercise. Patients will learn how to maintain good posture while sitting, walking, and lifting.  Balance Training and Fall Prevention  Clinical staff led group instruction and group discussion with PowerPoint presentation and patient guidebook. To enhance the learning environment the use of posters, models and videos may be added. At the conclusion of this workshop, patients will understand the importance of their sensorimotor skills (vision, proprioception, and the vestibular system) in maintaining their ability to balance as they age. Patients will apply a variety of balancing exercises that are appropriate for their current level of function. Patients will understand the common causes for poor balance, possible solutions to these problems, and ways to modify their physical environment in order to minimize their fall risk. The purpose of this lesson is to teach patients about the importance of  maintaining balance as they age and ways to minimize their risk of falling.  WORKSHOPS   Nutrition:  Fueling a Scientist, research (physical sciences) led group instruction and group discussion with PowerPoint presentation and patient guidebook. To enhance the learning environment the use of posters, models and videos may be added. Patients will review the foundational principles of the [place] and understand what constitutes a serving size in each of the food groups. Patients will also learn Pritikin-friendly foods that are better choices when away from home and review make-ahead meal and snack options. Calorie density will be reviewed and applied to three nutrition priorities:  weight maintenance, weight loss, and weight gain. The purpose of this lesson is to reinforce (in a group setting) the key concepts around what patients are recommended to eat and how to apply these guidelines when away from home by planning and selecting Pritikin-friendly options. Patients will understand how calorie density may be adjusted for different weight management goals.  Mindful Eating  Clinical staff led group instruction and group discussion with PowerPoint presentation and patient guidebook. To enhance the learning environment the use of posters, models and videos may be added. Patients will briefly review the concepts of the [place] and the importance of low-calorie dense foods. The concept of mindful eating will be introduced as well as the importance of paying attention to internal hunger signals. Triggers for non-hunger eating and techniques for dealing with triggers will be explored. The purpose of this lesson is to provide patients with the opportunity to review the basic principles of the [place], discuss the value of eating mindfully and how to measure internal cues of hunger and fullness using the Hunger Scale. Patients will also discuss reasons for non-hunger eating and learn strategies to use for controlling emotional eating.  Targeting Your Nutrition Priorities Clinical staff led group instruction and group discussion with PowerPoint presentation and patient guidebook. To enhance the learning environment the use of posters, models and videos may be added. Patients will learn how to determine their genetic susceptibility to disease by reviewing their family history. Patients will gain insight into the importance of diet as part of an overall healthy lifestyle in mitigating the impact of genetics and other environmental insults. The purpose of this lesson is to provide patients with the opportunity to assess their personal nutrition priorities by looking at  their family history, their own health history and current risk factors. Patients will also be able to discuss ways of prioritizing and modifying the [place] for their highest risk areas  Menu  Clinical staff led group instruction and group discussion with PowerPoint presentation and patient guidebook. To enhance the learning environment the use of posters, models and videos may be added. Using menus brought in from [employer], or printed from [employer], patients will apply the [place] dining out guidelines that were presented in the [employer] video. Patients will also be able to practice these guidelines in a variety of provided scenarios. The purpose of this lesson is to provide patients with the opportunity to practice hands-on learning of the [place] with actual menus and practice scenarios.  Label Reading Clinical staff led group instruction and group discussion with PowerPoint presentation and patient guidebook. To enhance the learning environment the use of posters, models and videos may be added. Patients will review and discuss the Pritikin label reading guidelines presented in Pritikin's Label Reading Educational series video. Using fool labels brought  in from local grocery stores and markets, patients will apply the label reading guidelines and determine if the packaged food meet the Pritikin guidelines. The purpose of this lesson is to provide patients with the opportunity to review, discuss, and practice hands-on learning of the Pritikin Label Reading guidelines with actual packaged food labels. [place] Workshops are designed to teach patients ways to prepare quick, simple, and affordable recipes at home. The importance of nutrition's role in chronic disease risk reduction is reflected in its emphasis in the overall Pritikin program. By learning how to prepare essential core Pritikin Eating  Plan recipes, patients will increase control over what they eat; be able to customize the flavor of foods without the use of added salt, sugar, or fat; and improve the quality of the food they consume. By learning a set of core recipes which are easily assembled, quickly prepared, and affordable, patients are more likely to prepare more healthy foods at home. These workshops focus on convenient breakfasts, simple entres, side dishes, and desserts which can be prepared with minimal effort and are consistent with nutrition recommendations for cardiovascular risk reduction. [employer] are taught by a Engineer, materials (RD) who has been trained by the [employer]. The chef or RD has a clear understanding of the importance of minimizing - if not completely eliminating - added fat, sugar, and sodium in recipes. Throughout the series of [facility] Workshop sessions, patients will learn about healthy ingredients and efficient methods of cooking to build confidence in their capability to prepare    Cooking School weekly topics:  Adding Flavor- Sodium-Free  Fast and Healthy Breakfasts  Powerhouse Plant-Based Proteins  Satisfying Salads and Dressings  Simple Sides and Sauces  International Cuisine-Spotlight on the [place] Zones  Delicious Desserts  Savory Soups  Efficiency Cooking - Meals in a Snap  Tasty Appetizers and Snacks  Comforting Weekend Breakfasts  One-Pot Wonders   Fast Evening Meals  Easy [place] (Psychosocial): New Thoughts, New Behaviors Clinical staff led group instruction and group discussion with PowerPoint presentation and patient guidebook. To enhance the learning environment the use of posters, models and videos may be added. Patients will learn and practice techniques for developing effective health and lifestyle goals. Patients will be able to effectively apply the goal  setting process learned to develop at least one new personal goal.  The purpose of this lesson is to expose patients to a new skill set of behavior modification techniques such as techniques setting SMART goals, overcoming barriers, and achieving new thoughts and new behaviors.  Managing Moods and Relationships Clinical staff led group instruction and group discussion with PowerPoint presentation and patient guidebook. To enhance the learning environment the use of posters, models and videos may be added. Patients will learn how emotional and chronic stress factors can impact their health and relationships. They will learn healthy ways to manage their moods and utilize positive coping mechanisms. In addition, I[facility] patients will learn ways to improve communication skills. The purpose of this lesson is to expose patients to ways of understanding how one's mood and health are intimately connected. Developing a healthy outlook can help build positive relationships and connections with others. Patients will understand the importance of utilizing effective communication skills that include actively listening and being heard. They will learn and understand the importance of the "4 Cs" and especially Connections in fostering of a Healthy  Mind-Set.  Healthy Sleep for a Healthy Heart Clinical staff led group instruction and group discussion with PowerPoint presentation and patient guidebook. To enhance the learning environment the use of posters, models and videos may be added. At the conclusion of this workshop, patients will be able to demonstrate knowledge of the importance of sleep to overall health, well-being, and quality of life. They will understand the symptoms of, and treatments for, common sleep disorders. Patients will also be able to identify daytime and nighttime behaviors which impact sleep, and they will be able to apply these tools to help manage sleep-related challenges. The purpose of this lesson is  to provide patients with a general overview of sleep and outline the importance of quality sleep. Patients will learn about a few of the most common sleep disorders. Patients will also be introduced to the concept of "sleep hygiene," and discover ways to self-manage certain sleeping problems through simple daily behavior changes. Finally, the workshop will motivate patients by clarifying the links between quality sleep and their goals of heart-healthy living.   Recognizing and Reducing Stress Clinical staff led group instruction and group discussion with PowerPoint presentation and patient guidebook. To enhance the learning environment the use of posters, models and videos may be added. At the conclusion of this workshop, patients will be able to understand the types of stress reactions, differentiate between acute and chronic stress, and recognize the impact that chronic stress has on their health. They will also be able to apply different coping mechanisms, such as reframing negative self-talk. Patients will have the opportunity to practice a variety of stress management techniques, such as deep abdominal breathing, progressive muscle relaxation, and/or guided imagery.  The purpose of this lesson is to educate patients on the role of stress in their lives and to provide healthy techniques for coping with it.  Learning Barriers/Preferences:  Learning Barriers/Preferences - 05/28/22 1249       Learning Barriers/Preferences   Learning Barriers Sight   wears glasses   Learning Preferences Skilled Demonstration;Individual Instruction             Education Topics:  Knowledge Questionnaire Score:  Knowledge Questionnaire Score - 05/28/22 1248       Knowledge Questionnaire Score   Pre Score 21/24             Core Components/Risk Factors/Patient Goals at Admission:  Personal Goals and Risk Factors at Admission - 05/28/22 1248       Core Components/Risk Factors/Patient Goals on  Admission    Weight Management Yes;Obesity    Intervention Weight Management: Develop a combined nutrition and exercise program designed to reach desired caloric intake, while maintaining appropriate intake of nutrient and fiber, sodium and fats, and appropriate energy expenditure required for the weight goal.;Weight Management: Provide education and appropriate resources to help participant work on and attain dietary goals.;Weight Management/Obesity: Establish reasonable short term and long term weight goals.    Expected Outcomes Short Term: Continue to assess and modify interventions until short term weight is achieved;Long Term: Adherence to nutrition and physical activity/exercise program aimed toward attainment of established weight goal;Understanding recommendations for meals to include 15-35% energy as protein, 25-35% energy from fat, 35-60% energy from carbohydrates, less than '200mg'$  of dietary cholesterol, 20-35 gm of total fiber daily;Understanding of distribution of calorie intake throughout the day with the consumption of 4-5 meals/snacks    Heart Failure Yes    Intervention Provide a combined exercise and nutrition program that is supplemented with education, support  and counseling about heart failure. Directed toward relieving symptoms such as shortness of breath, decreased exercise tolerance, and extremity edema.    Expected Outcomes Improve functional capacity of life;Short term: Attendance in program 2-3 days a week with increased exercise capacity. Reported lower sodium intake. Reported increased fruit and vegetable intake. Reports medication compliance.;Short term: Daily weights obtained and reported for increase. Utilizing diuretic protocols set by physician.    Hypertension Yes    Intervention Provide education on lifestyle modifcations including regular physical activity/exercise, weight management, moderate sodium restriction and increased consumption of fresh fruit, vegetables, and low  fat dairy, alcohol moderation, and smoking cessation.;Monitor prescription use compliance.    Expected Outcomes Short Term: Continued assessment and intervention until BP is < 140/34m HG in hypertensive participants. < 130/870mHG in hypertensive participants with diabetes, heart failure or chronic kidney disease.;Long Term: Maintenance of blood pressure at goal levels.    Lipids Yes    Intervention Provide education and support for participant on nutrition & aerobic/resistive exercise along with prescribed medications to achieve LDL '70mg'$ , HDL >'40mg'$ .    Expected Outcomes Short Term: Participant states understanding of desired cholesterol values and is compliant with medications prescribed. Participant is following exercise prescription and nutrition guidelines.;Long Term: Cholesterol controlled with medications as prescribed, with individualized exercise RX and with personalized nutrition plan. Value goals: LDL < '70mg'$ , HDL > 40 mg.             Core Components/Risk Factors/Patient Goals Review:   Goals and Risk Factor Review     Row Name 06/06/22 1623             Core Components/Risk Factors/Patient Goals Review   Personal Goals Review Weight Management/Obesity;Heart Failure;Stress;Hypertension;Lipids       Review [name] started intensive cardiac rehab on 06/03/22. [name] is off to a good start to exercise. Vital signs have been stable       Expected Outcomes [name] will continue to participate in intensive cardiac rehab for exercise, nutrition and lifestyle modifications                Core Components/Risk Factors/Patient Goals at Discharge (Final Review):   Goals and Risk Factor Review - 06/06/22 1623       Core Components/Risk Factors/Patient Goals Review   Personal Goals Review Weight Management/Obesity;Heart Failure;Stress;Hypertension;Lipids    Review [name] started intensive cardiac rehab on 06/03/22. [name] is off to a good start to exercise. Vital signs have been stable     Expected Outcomes [name] will continue to participate in intensive cardiac rehab for exercise, nutrition and lifestyle modifications             ITP Comments:  ITP Comments     Row Name 05/28/22 1103 06/06/22 1603         ITP Comments Medical director - TrFransico HimMD.  Introduction to the [facility] / Intensive Cardiac Rehab.  Inital orientation packet reviewed with the patient. 30 Day ITP Review. [name] started intensive cardiac rehab on 06/03/22 and is off to a good start to exercise.               Comments: See ITP comments.

## 2022-06-12 ENCOUNTER — Encounter (HOSPITAL_COMMUNITY)
Admission: RE | Admit: 2022-06-12 | Discharge: 2022-06-12 | Disposition: A | Discharge status: Home / Self Care | Payer: PPO | Source: Ambulatory Visit | Attending: Cardiovascular Disease | Admitting: Cardiovascular Disease

## 2022-06-12 DIAGNOSIS — I5022 Chronic systolic (congestive) heart failure: Secondary | ICD-10-CM | POA: Diagnosis not present

## 2022-06-14 ENCOUNTER — Encounter (HOSPITAL_COMMUNITY): Payer: PPO

## 2022-06-17 ENCOUNTER — Encounter (HOSPITAL_COMMUNITY)
Admission: RE | Admit: 2022-06-17 | Discharge: 2022-06-17 | Disposition: A | Discharge status: Home / Self Care | Payer: PPO | Source: Ambulatory Visit | Attending: Cardiovascular Disease | Admitting: Cardiovascular Disease

## 2022-06-17 DIAGNOSIS — I5181 Takotsubo syndrome: Secondary | ICD-10-CM | POA: Insufficient documentation

## 2022-06-17 DIAGNOSIS — I5022 Chronic systolic (congestive) heart failure: Secondary | ICD-10-CM | POA: Insufficient documentation

## 2022-06-17 DIAGNOSIS — Z48812 Encounter for surgical aftercare following surgery on the circulatory system: Secondary | ICD-10-CM | POA: Diagnosis not present

## 2022-06-19 ENCOUNTER — Encounter (HOSPITAL_COMMUNITY)
Admission: RE | Admit: 2022-06-19 | Discharge: 2022-06-19 | Disposition: A | Discharge status: Home / Self Care | Payer: PPO | Source: Ambulatory Visit | Attending: Cardiovascular Disease | Admitting: Cardiovascular Disease

## 2022-06-19 DIAGNOSIS — I5022 Chronic systolic (congestive) heart failure: Secondary | ICD-10-CM

## 2022-06-21 ENCOUNTER — Encounter (HOSPITAL_COMMUNITY)
Admission: RE | Admit: 2022-06-21 | Discharge: 2022-06-21 | Disposition: A | Discharge status: Home / Self Care | Payer: PPO | Source: Ambulatory Visit | Attending: Cardiovascular Disease | Admitting: Cardiovascular Disease

## 2022-06-21 DIAGNOSIS — I5022 Chronic systolic (congestive) heart failure: Secondary | ICD-10-CM | POA: Diagnosis not present

## 2022-06-23 DIAGNOSIS — G4733 Obstructive sleep apnea (adult) (pediatric): Secondary | ICD-10-CM | POA: Diagnosis not present

## 2022-06-24 ENCOUNTER — Encounter (HOSPITAL_COMMUNITY)
Admission: RE | Admit: 2022-06-24 | Discharge: 2022-06-24 | Disposition: A | Discharge status: Home / Self Care | Payer: PPO | Source: Ambulatory Visit | Attending: Cardiovascular Disease | Admitting: Cardiovascular Disease

## 2022-06-24 DIAGNOSIS — I5022 Chronic systolic (congestive) heart failure: Secondary | ICD-10-CM | POA: Diagnosis not present

## 2022-06-26 ENCOUNTER — Encounter (HOSPITAL_COMMUNITY)
Admission: RE | Admit: 2022-06-26 | Discharge: 2022-06-26 | Disposition: A | Discharge status: Home / Self Care | Payer: PPO | Source: Ambulatory Visit | Attending: Cardiovascular Disease | Admitting: Cardiovascular Disease

## 2022-06-26 DIAGNOSIS — I5022 Chronic systolic (congestive) heart failure: Secondary | ICD-10-CM | POA: Diagnosis not present

## 2022-06-28 ENCOUNTER — Encounter (HOSPITAL_COMMUNITY)
Admission: RE | Admit: 2022-06-28 | Discharge: 2022-06-28 | Disposition: A | Discharge status: Home / Self Care | Payer: PPO | Source: Ambulatory Visit | Attending: Cardiovascular Disease | Admitting: Cardiovascular Disease

## 2022-06-28 DIAGNOSIS — I5022 Chronic systolic (congestive) heart failure: Secondary | ICD-10-CM | POA: Diagnosis not present

## 2022-07-01 ENCOUNTER — Encounter (HOSPITAL_COMMUNITY)
Admission: RE | Admit: 2022-07-01 | Discharge: 2022-07-01 | Disposition: A | Discharge status: Home / Self Care | Payer: PPO | Source: Ambulatory Visit | Attending: Cardiovascular Disease | Admitting: Cardiovascular Disease

## 2022-07-01 DIAGNOSIS — I5022 Chronic systolic (congestive) heart failure: Secondary | ICD-10-CM

## 2022-07-03 ENCOUNTER — Encounter (HOSPITAL_COMMUNITY)
Admission: RE | Admit: 2022-07-03 | Discharge: 2022-07-03 | Disposition: A | Discharge status: Home / Self Care | Payer: PPO | Source: Ambulatory Visit | Attending: Cardiovascular Disease | Admitting: Cardiovascular Disease

## 2022-07-03 DIAGNOSIS — I5022 Chronic systolic (congestive) heart failure: Secondary | ICD-10-CM | POA: Diagnosis not present

## 2022-07-03 NOTE — Progress Notes (Signed)
Cardiac Individual Treatment Plan  Patient Details  Name: Leslie [name] MRN: [id number] Date of Birth: 10/15/1945 Referring Provider:   [place] from 05/28/2022 in [facility] for Heart, Vascular, & [place]  Referring Provider Sanda Klein, MD       Initial Encounter Date:  [place] from 05/28/2022 in [facility] for Heart, Vascular, & Lung Health  Date 05/28/22       Visit Diagnosis: Heart failure, chronic systolic ([place])  Patient's Home Medications on Admission:  Current Outpatient Medications:    acetaminophen (TYLENOL) 650 MG CR tablet, Take 1,300 mg by mouth every 8 (eight) hours as needed for pain., Disp: , Rfl:    amiodarone (PACERONE) 200 MG tablet, Take 1 tablet (200 mg total) by mouth daily., Disp: 90 tablet, Rfl: 3   apixaban (ELIQUIS) 5 MG TABS tablet, Take 1 tablet (5 mg total) by mouth 2 (two) times daily., Disp: 180 tablet, Rfl: 3   ARNUITY ELLIPTA 50 MCG/ACT AEPB, Inhale into the lungs., Disp: , Rfl:    atorvastatin (LIPITOR) 40 MG tablet, Take 1 tablet (40 mg total) by mouth daily., Disp: 90 tablet, Rfl: 3   Bacillus Coagulans-Inulin (ALIGN PREBIOTIC-PROBIOTIC PO), Take 2 capsules by mouth daily. Gummy, Disp: , Rfl:    cholecalciferol (VITAMIN D) 1000 UNITS tablet, Take 1,000 Units by mouth daily., Disp: , Rfl:    diclofenac Sodium (VOLTAREN) 1 % GEL, Apply 1 application topically 2 (two) times daily., Disp: , Rfl:    fexofenadine (ALLEGRA) 180 MG tablet, Take 180 mg by mouth daily., Disp: , Rfl:    fluticasone (FLONASE) 50 MCG/ACT nasal spray, Place 2 sprays into both nostrils at bedtime. , Disp: , Rfl:    furosemide (LASIX) 40 MG tablet, Take 1 tablet (40 mg total) by mouth as needed. For weight greater than 261 lbs, Disp: 30 tablet, Rfl: 3   Glucosamine-Chondroitin (OSTEO BI-FLEX REGULAR STRENGTH PO), Take 1 tablet by mouth daily., Disp: ,  Rfl:    levothyroxine (SYNTHROID, LEVOTHROID) 50 MCG tablet, Take 50 mcg by mouth daily before breakfast., Disp: , Rfl:    magnesium oxide (MAG-OX) 400 (240 Mg) MG tablet, Take 1 tablet (400 mg total) by mouth daily., Disp: 90 tablet, Rfl: 3   metoprolol succinate (TOPROL-XL) 100 MG 24 hr tablet, Take 100 mg (one tablet) in the morning and 50 mg (half a tablet) in the evening., Disp: 135 tablet, Rfl: 3   metroNIDAZOLE (METROCREAM) 0.75 % cream, Apply 1 application  topically daily as needed., Disp: , Rfl:    mometasone (ELOCON) 0.1 % cream, Apply 1 application topically 2 (two) times daily as needed (skin irritation)., Disp: , Rfl:    Multiple Vitamins-Minerals (CENTRUM MULTIGUMMIES) CHEW, Chew 1 tablet by mouth daily., Disp: , Rfl:    omeprazole (PRILOSEC) 20 MG capsule, Take 40 mg by mouth daily before breakfast., Disp: , Rfl:    sacubitril-valsartan (ENTRESTO) 49-51 MG, Take 1 tablet by mouth 2 (two) times daily., Disp: 60 tablet, Rfl: 11   senna (SENOKOT) 8.6 MG tablet, Take 1 tablet by mouth every evening., Disp: , Rfl:   Past Medical History: Past Medical History:  Diagnosis Date   Adenomatous polyp    Arthritis    "knees, legs, fingers" (07/30/2016)   Asthma    Bursitis of left shoulder    "just finished PT" (07/30/2016)   Chest pain    a. 2003 Abnl stress test-->Cath: nonobs  CAD.   CHF (congestive heart failure) (HCC)    Chronic bronchitis (HCC)    Chronic kidney disease    stage 3   GERD (gastroesophageal reflux disease)    barrett's esophagus- Dr. Cristina Gong   History of hiatal hernia    Hyperlipidemia    Hypertension    Hypothyroidism    Migraines    "sporatic; at least a few/year" (07/30/2016)   Mitral regurgitation    a. 07/2016 Echo: mild to mod MR;  b. 07/2016 TEE: mild MR.   Moderate aortic insufficiency    a. 07/2016 Echo: EF 55-60%, mod AI;  b. 07/2016 TEE: EF 50-55%, mod AI.   Obesity    s/p lap band surgery 6/10   OSA on CPAP    PAF (paroxysmal atrial fibrillation)  ([place])    a. 07/2016 TEE/DCCV: EF 50-55%, mild MR, mod AI, mild to mod TR, neg bubble study-->successful DCCV x1 w/ 120J.   PONV (postoperative nausea and vomiting)    Vitamin D deficiency     Tobacco Use: Social History   Tobacco Use  Smoking Status Former  Smokeless Tobacco Never  Tobacco Comments   07/30/2016 "social smoker only; in college"    Labs: Review Flowsheet  More data exists      Latest Ref Rng & Units 07/31/2016 06/24/2019 05/09/2020 09/09/2020 04/11/2022  Labs for ITP Cardiac and Pulmonary Rehab  Cholestrol 0 - 200 mg/dL 146  - - - -  LDL (calc) 0 - 99 mg/dL 79  - - - -  HDL-C >40 mg/dL 32  - - - -  Trlycerides <150 mg/dL 174  - - - -  Hemoglobin A1c 4.8 - 5.6 % 5.6  - - - -  PH, Arterial 7.35 - 7.45 - - - - 7.412   PCO2 arterial 32 - 48 mmHg - - - - 39.0   Bicarbonate 20.0 - 28.0 mmol/L 20.0 - 28.0 mmol/L - - - - 26.5  27.4  24.9   TCO2 22 - 32 mmol/L 22 - 32 mmol/L - 23  22  19  28  29  26   $ O2 Saturation % % - - - - 62  62  92     Capillary Blood Glucose: Lab Results  Component Value Date   GLUCAP 135 (H) 09/09/2020     Exercise Target Goals: Exercise Program Goal: Individual exercise prescription set using results from initial 6 min walk test and THRR while considering  patient's activity barriers and safety.   Exercise Prescription Goal: Initial exercise prescription builds to 30-45 minutes a day of aerobic activity, 2-3 days per week.  Home exercise guidelines will be given to patient during program as part of exercise prescription that the participant will acknowledge.  Activity Barriers & Risk Stratification:  Activity Barriers & Cardiac Risk Stratification - 05/28/22 1259       Activity Barriers & Cardiac Risk Stratification   Activity Barriers Arthritis;Neck/Spine Problems;Joint Problems;Deconditioning;Muscular Weakness;Decreased Ventricular Function;Balance Concerns    Cardiac Risk Stratification High             6 Minute Walk:  6  Minute Walk     Row Name 05/28/22 1256         6 Minute Walk   Phase Initial  Nustep used     Distance 1574 feet  Nustep used     Walk Time 6 minutes     # of Rest Breaks 0     MPH 3     METS  2.2     RPE 11     Perceived Dyspnea  0     VO2 Peak 7.1     Symptoms Yes (comment)     Comments Rt shoulder apin 6/10; right knee pain 6/10; left knee pain 5/10; left shoulder pain 4/10; left foot pain 5/10     Resting HR 68 bpm     Resting BP 110/64     Resting Oxygen Saturation  98 %     Exercise Oxygen Saturation  during 6 min walk 97 %     Max Ex. HR 100 bpm     Max Ex. BP 114/74     2 Minute Post BP 104/70              Oxygen Initial Assessment:   Oxygen Re-Evaluation:   Oxygen Discharge (Final Oxygen Re-Evaluation):   Initial Exercise Prescription:  Initial Exercise Prescription - 05/28/22 1300       Date of Initial Exercise RX and Referring Provider   Date 05/28/22    Referring Provider Sanda Klein, MD    Expected Discharge Date 08/09/22      T5 Nustep   Level 1    SPM 75    Minutes 25    METs 2      Prescription Details   Frequency (times per week) 3    Duration Progress to 30 minutes of continuous aerobic without signs/symptoms of physical distress      Intensity   THRR 40-80% of Max Heartrate 58 - 115    Ratings of Perceived Exertion 11-13    Perceived Dyspnea 0-4      Progression   Progression Continue progressive overload as per policy without signs/symptoms or physical distress.      Resistance Training   Training Prescription Yes    Weight 2 lbs    Reps 10-15             Perform Capillary Blood Glucose checks as needed.  Exercise Prescription Changes:   Exercise Prescription Changes     Row Name 06/03/22 1500 06/19/22 1400 06/28/22 1518         Response to Exercise   Blood Pressure (Admit) 132/70 116/64 116/70     Blood Pressure (Exercise) 156/70 120/66 128/80     Blood Pressure (Exit) 128/64 112/60 100/54     Heart  Rate (Admit) 73 bpm 80 bpm 62 bpm     Heart Rate (Exercise) 101 bpm 121 bpm 85 bpm     Heart Rate (Exit) 79 bpm 69 bpm 71 bpm     Rating of Perceived Exertion (Exercise) 12 11 11      Symptoms chronic bilateral knee pain (3/10) on Nustep None None     Comments Pt's first day in the CRP2 program Reviewed METs Reviewed MET and goals     Duration Progress to 30 minutes of  aerobic without signs/symptoms of physical distress Continue with 30 min of aerobic exercise without signs/symptoms of physical distress. Continue with 30 min of aerobic exercise without signs/symptoms of physical distress.     Intensity THRR unchanged THRR unchanged THRR unchanged       Progression   Progression Continue to progress workloads to maintain intensity without signs/symptoms of physical distress. Continue to progress workloads to maintain intensity without signs/symptoms of physical distress. Continue to progress workloads to maintain intensity without signs/symptoms of physical distress.     Average METs 2.1 2.6 2.6       Resistance Training  Training Prescription Yes No Yes     Weight 2 lbs No weights on wednesdays 2 lbs     Reps 10-15 -- 10-15     Time 10 Minutes -- 10 Minutes       Interval Training   Interval Training No No No       T5 Nustep   Level 1 1 1      SPM 88 107 110     Minutes 25 28 30      METs 2.1 2.6 2.6              Exercise Comments:   Exercise Comments     Row Name 06/03/22 1550 06/19/22 1440 07/02/22 1521       Exercise Comments Pt's first day in the CRP2 program. Pt had no complaints other than chronic bilateral knee pain Reviewed METs with patient today. Pt is progressing duration and instensity. Reviewed METs and goals. Will attempt to increase workload on Nustep next week.              Exercise Goals and Review:   Exercise Goals     Row Name 05/28/22 1259             Exercise Goals   Increase Physical Activity Yes       Intervention Provide advice,  education, support and counseling about physical activity/exercise needs.;Develop an individualized exercise prescription for aerobic and resistive training based on initial evaluation findings, risk stratification, comorbidities and participant's personal goals.       Expected Outcomes Short Term: Attend rehab on a regular basis to increase amount of physical activity.;Long Term: Add in home exercise to make exercise part of routine and to increase amount of physical activity.;Long Term: Exercising regularly at least 3-5 days a week.       Increase Strength and Stamina Yes       Intervention Provide advice, education, support and counseling about physical activity/exercise needs.;Develop an individualized exercise prescription for aerobic and resistive training based on initial evaluation findings, risk stratification, comorbidities and participant's personal goals.       Expected Outcomes Short Term: Increase workloads from initial exercise prescription for resistance, speed, and METs.;Short Term: Perform resistance training exercises routinely during rehab and add in resistance training at home;Long Term: Improve cardiorespiratory fitness, muscular endurance and strength as measured by increased METs and functional capacity (6MWT)       Able to understand and use rate of perceived exertion (RPE) scale Yes       Intervention Provide education and explanation on how to use RPE scale       Expected Outcomes Short Term: Able to use RPE daily in rehab to express subjective intensity level;Long Term:  Able to use RPE to guide intensity level when exercising independently       Knowledge and understanding of Target Heart Rate Range (THRR) Yes       Intervention Provide education and explanation of THRR including how the numbers were predicted and where they are located for reference       Expected Outcomes Short Term: Able to state/look up THRR;Short Term: Able to use daily as guideline for intensity in  rehab;Long Term: Able to use THRR to govern intensity when exercising independently       Understanding of Exercise Prescription Yes       Intervention Provide education, explanation, and written materials on patient's individual exercise prescription       Expected Outcomes Short Term: Able to explain  program exercise prescription;Long Term: Able to explain home exercise prescription to exercise independently                Exercise Goals Re-Evaluation :  Exercise Goals Re-Evaluation     Row Name 06/03/22 1548 06/28/22 1520           Exercise Goal Re-Evaluation   Exercise Goals Review Increase Physical Activity;Increase Strength and Stamina;Able to understand and use rate of perceived exertion (RPE) scale;Knowledge and understanding of Target Heart Rate Range (THRR);Understanding of Exercise Prescription Increase Physical Activity;Increase Strength and Stamina;Able to understand and use rate of perceived exertion (RPE) scale;Knowledge and understanding of Target Heart Rate Range (THRR);Understanding of Exercise Prescription      Comments Pt's first day in the CRP2 program. Pt understands the exericse Rx, THRR, and RPE. Reviewed METs and goals. Pt voices she has improved stamina since beginning the CRP2 program.      Expected Outcomes Will continue to monitor patient and progress exercise workloads as tolerated. Will continue to monitor patient and progress exercise workloads as tolerated.               Discharge Exercise Prescription (Final Exercise Prescription Changes):  Exercise Prescription Changes - 06/28/22 1518       Response to Exercise   Blood Pressure (Admit) 116/70    Blood Pressure (Exercise) 128/80    Blood Pressure (Exit) 100/54    Heart Rate (Admit) 62 bpm    Heart Rate (Exercise) 85 bpm    Heart Rate (Exit) 71 bpm    Rating of Perceived Exertion (Exercise) 11    Symptoms None    Comments Reviewed MET and goals    Duration Continue with 30 min of aerobic  exercise without signs/symptoms of physical distress.    Intensity THRR unchanged      Progression   Progression Continue to progress workloads to maintain intensity without signs/symptoms of physical distress.    Average METs 2.6      Resistance Training   Training Prescription Yes    Weight 2 lbs    Reps 10-15    Time 10 Minutes      Interval Training   Interval Training No      T5 Nustep   Level 1    SPM 110    Minutes 30    METs 2.6             Nutrition:  Target Goals: Understanding of nutrition guidelines, daily intake of sodium 1500mg , cholesterol 200mg , calories 30% from fat and 7% or less from saturated fats, daily to have 5 or more servings of fruits and vegetables.  Biometrics:  Pre Biometrics - 05/28/22 1035       Pre Biometrics   Waist Circumference 48 inches    Hip Circumference 55.5 inches    Waist to Hip Ratio 0.86 %    Triceps Skinfold 46 mm    % Body Fat 55.2 %    Grip Strength 20 kg    Flexibility 11.25 in    Single Leg Stand 1.85 seconds              Nutrition Therapy Plan and Nutrition Goals:  Nutrition Therapy & Goals - 07/01/22 0934       Nutrition Therapy   Diet Heart healthy Diet    Drug/Food Interactions Statins/Certain Fruits      Personal Nutrition Goals   Nutrition Goal Patient to identify strategies for reducing cardiovascular risk by attending the weekly [facility] education  and nutrition series    Personal Goal #2 Patient to improve diet quality by using the plate method as a daily guide for meal planning to include lean protein/plant protein, fruits, vegetables, whole grains, nonfat dairy as part of well balanced diet    Personal Goal #3 Patient to limit sodium to 1500mg  per day    Comments Goals in action. Leslie [name] reports increased intake of fruits, vegetables, whole grains. She continues to attend the [facility] education and nutrition series regularly. She has maintained her weight since starting with our program. Carlyon Prows  will benefit from partication in intensive cardiac rehab for nutrition, exercise, and lifestyle modification.      Intervention Plan   Intervention Prescribe, educate and counsel regarding individualized specific dietary modifications aiming towards targeted core components such as weight, hypertension, lipid management, diabetes, heart failure and other comorbidities.;Nutrition handout(s) given to patient.    Expected Outcomes Short Term Goal: Understand basic principles of dietary content, such as calories, fat, sodium, cholesterol and nutrients.;Long Term Goal: Adherence to prescribed nutrition plan.             Nutrition Assessments:  Nutrition Assessments - 06/03/22 1434       Rate Your Plate Scores   Pre Score 64            MEDIFICTS Score Key: ?70 Need to make dietary changes  40-70 Heart Healthy Diet ? 40 Therapeutic Level Cholesterol Diet   Flowsheet Row INTENSIVE CARDIAC REHAB from 06/03/2022 in [facility] for Heart, Vascular, & Lung Health  Picture Your Plate Total Score on Admission 64      Picture Your Plate Scores: [id number] Unhealthy dietary pattern with much room for improvement. 41-50 Dietary pattern unlikely to meet recommendations for good health and room for improvement. 51-60 More healthful dietary pattern, with some room for improvement.  >60 Healthy dietary pattern, although there may be some specific behaviors that could be improved.    Nutrition Goals Re-Evaluation:  Nutrition Goals Re-Evaluation     [place] Name 06/03/22 1432 07/01/22 0934           Goals   Current Weight 262 lb 2 oz (118.9 kg) 260 lb 2.3 oz (118 kg)      Comment GFR 51, Cr 1.13, A1c 5.9, triglycerides 200, LDL 104 GFR 51, Cr 1.13, LipoproteinA 155      Expected Outcome Leslie [name] will benefit from partication in intensive cardiac rehab for nutrition, exercise, and lifestyle modification. Goals in action. Leslie [name] reports increased intake of fruits, vegetables, whole  grains. She continues to attend the [facility] education and nutrition series regularly. She has maintained her weight since starting with our program. We discussed some substitutions for reducing added sugar (example coffee creamer, etc). Leslie [name] will benefit from partication in intensive cardiac rehab for nutrition, exercise, and lifestyle modification.               Nutrition Goals Re-Evaluation:  Nutrition Goals Re-Evaluation     [place] Name 06/03/22 1432 07/01/22 0934           Goals   Current Weight 262 lb 2 oz (118.9 kg) 260 lb 2.3 oz (118 kg)      Comment GFR 51, Cr 1.13, A1c 5.9, triglycerides 200, LDL 104 GFR 51, Cr 1.13, LipoproteinA 155      Expected Outcome Leslie [name] will benefit from partication in intensive cardiac rehab for nutrition, exercise, and lifestyle modification. Goals in action. Leslie [name] reports increased intake of fruits, vegetables, whole grains. She  continues to attend the [facility] education and nutrition series regularly. She has maintained her weight since starting with our program. We discussed some substitutions for reducing added sugar (example coffee creamer, etc). Leslie [name] will benefit from partication in intensive cardiac rehab for nutrition, exercise, and lifestyle modification.               Nutrition Goals Discharge (Final Nutrition Goals Re-Evaluation):  Nutrition Goals Re-Evaluation - 07/01/22 0934       Goals   Current Weight 260 lb 2.3 oz (118 kg)    Comment GFR 51, Cr 1.13, LipoproteinA 155    Expected Outcome Goals in action. Leslie [name] reports increased intake of fruits, vegetables, whole grains. She continues to attend the [facility] education and nutrition series regularly. She has maintained her weight since starting with our program. We discussed some substitutions for reducing added sugar (example coffee creamer, etc). Leslie [name] will benefit from partication in intensive cardiac rehab for nutrition, exercise, and lifestyle modification.              Psychosocial: Target Goals: Acknowledge presence or absence of significant depression and/or stress, maximize coping skills, provide positive support system. Participant is able to verbalize types and ability to use techniques and skills needed for reducing stress and depression.  Initial Review & Psychosocial Screening:  Initial Psych Review & Screening - 05/28/22 1622       Initial Review   Current issues with Current Sleep Concerns;Current Anxiety/Panic      Family Dynamics   Good Support System? Yes   Leslie [name] lives alone she has friends who live in the area and a daughter for support who lives in [place]     Barriers   Psychosocial barriers to participate in program The patient should benefit from training in stress management and relaxation.      Screening Interventions   Interventions Encouraged to exercise             Quality of Life Scores:  Quality of Life - 05/28/22 1248       Quality of Life   Select Quality of Life      Quality of Life Scores   Health/Function Pre 21.39 %    Socioeconomic Pre 30 %    Psych/Spiritual Pre 23.79 %    Family Pre 27 %    GLOBAL Pre 24.32 %            Scores of 19 and below usually indicate a poorer quality of life in these areas.  A difference of  2-3 points is a clinically meaningful difference.  A difference of 2-3 points in the total score of the Quality of Life Index has been associated with significant improvement in overall quality of life, self-image, physical symptoms, and general health in studies assessing change in quality of life.  PHQ-9: Review Flowsheet       05/28/2022 02/22/2022  Depression screen PHQ 2/9  Decreased Interest 0 0  Down, Depressed, Hopeless 0 0  PHQ - 2 Score 0 0  Altered sleeping 2 -  Tired, decreased energy 1 -  Change in appetite 0 -  Feeling bad or failure about yourself  0 -  Trouble concentrating 0 -  Moving slowly or fidgety/restless 0 -  Suicidal thoughts 0 -  PHQ-9 Score 3  -  Difficult doing work/chores Not difficult at all -   Interpretation of Total Score  Total Score Depression Severity:  1-4 = Minimal depression, 5-9 = Mild depression, 10-14 = Moderate depression,  15-19 = Moderately severe depression, 20-27 = Severe depression   Psychosocial Evaluation and Intervention:   Psychosocial Re-Evaluation:  Psychosocial Re-Evaluation     [place] Name 06/06/22 1606 07/03/22 1410           Psychosocial Re-Evaluation   Current issues with Current Stress Concerns;Current Anxiety/Panic Current Stress Concerns;Current Anxiety/Panic      Comments Leslie [name] started intensive cardiac rehab on 06/03/22 and did not voice any increased concerns or stressors on her first day of exercise.Leslie [name] admits to expereincing some anxiety due to her recent cardiac event/ diagnosis Carlyon Prows has not voiced  any increased concerns or stressors at intensive cardiac rehab      Expected Outcomes Leslie [name] will have decreased or controlled anxiety upon completion of intensive cardiac rehab Leslie [name] will have decreased or controlled anxiety upon completion of intensive cardiac rehab      Interventions Stress management education;Encouraged to attend Cardiac Rehabilitation for the exercise;Relaxation education Stress management education;Encouraged to attend Cardiac Rehabilitation for the exercise;Relaxation education      Continue Psychosocial Services  Follow up required by staff No Follow up required        Initial Review   Source of Stress Concerns Chronic Illness Chronic Illness      Comments Will continue to monitor and offer support as needed. Will continue to monitor and offer support as needed.               Psychosocial Discharge (Final Psychosocial Re-Evaluation):  Psychosocial Re-Evaluation - 07/03/22 1410       Psychosocial Re-Evaluation   Current issues with Current Stress Concerns;Current Anxiety/Panic    Comments Leslie [name] has not voiced  any increased concerns or stressors at  intensive cardiac rehab    Expected Outcomes Leslie [name] will have decreased or controlled anxiety upon completion of intensive cardiac rehab    Interventions Stress management education;Encouraged to attend Cardiac Rehabilitation for the exercise;Relaxation education    Continue Psychosocial Services  No Follow up required      Initial Review   Source of Stress Concerns Chronic Illness    Comments Will continue to monitor and offer support as needed.             Vocational Rehabilitation: Provide vocational rehab assistance to qualifying candidates.   Vocational Rehab Evaluation & Intervention:  Vocational Rehab - 05/28/22 1624       Initial Vocational Rehab Evaluation & Intervention   Assessment shows need for Vocational Rehabilitation No   Carlyon Prows is retired and does not need vocational rehab at this time            Education: Education Goals: Education classes will be provided on a weekly basis, covering required topics. Participant will state understanding/return demonstration of topics presented.    Education     Row Name 06/03/22 1500     Education   Cardiac Education Topics Pritikin   Academic librarian Exercise Education   Exercise Education Biomechanial Limitations   Instruction Review Code 1- Verbalizes Understanding   Class Start Time 1406   Class Stop Time [id number]   Class Time Calculation (min) 36 min    [place] Name 06/05/22 1600     Education   Cardiac Education Topics Pritikin   Financial trader   Weekly Topic Fast Evening Meals   Instruction Review Code 1- Verbalizes Understanding   Class Start Time  1400   Class Stop Time 1445   Class Time Calculation (min) 45 min    [place] Name 06/10/22 1500     Education   Cardiac Education Topics Pritikin   Financial trader   Weekly Topic International Cuisine-  Spotlight on the [place]   Instruction Review Code 1- Verbalizes Understanding   Class Start Time 1405   Class Stop Time 1453   Class Time Calculation (min) 48 min    [place] Name 06/12/22 1500     Education   Cardiac Education Topics Pritikin   Lexicographer Nutrition   Nutrition Facts on Fat   Instruction Review Code 1- Verbalizes Understanding   Class Start Time 1400   Class Stop Time 1450   Class Time Calculation (min) 50 min    [place] Name 06/17/22 1600     Education   Cardiac Education Topics Pritikin   [employer] Workshops     Workshops   Educator Exercise Physiologist   Select Psychosocial   Psychosocial Workshop Other  From [employer] to Heart   Instruction Review Code 1- Verbalizes Understanding   Class Start Time 1400   Class Stop Time 1452   Class Time Calculation (min) 52 min    [place] Name 06/21/22 1600     Education   Cardiac Education Topics Pritikin   Architect Education   General Education Heart Disease Risk Reduction   Instruction Review Code 1- Verbalizes Understanding   Class Start Time 1355   Class Stop Time 1440   Class Time Calculation (min) 45 min    [place] Name 06/24/22 1500     Education   Cardiac Education Topics Pritikin   [employer] Workshops     Workshops   Educator Exercise Physiologist   Select Exercise   Exercise Workshop Hotel manager and Fall Prevention   Instruction Review Code 1- Verbalizes Understanding   Class Start Time 1350   Class Stop Time 1432   Class Time Calculation (min) 42 min    [place] Name 06/26/22 1500     Education   Cardiac Education Topics Pritikin   Financial trader   Weekly Topic Fast and Healthy Breakfasts   Instruction Review Code 1- Verbalizes Understanding   Class Start Time 1355   Class Stop Time 1450   Class Time Calculation (min) 55  min    [place] Name 06/28/22 1600     Education   Cardiac Education Topics Pritikin   Charity fundraiser Dietitian   Nutrition Overview of the Pritikin Eating Plan   Instruction Review Code 1- Verbalizes Understanding   Class Start Time 1400   Class Stop Time 1436   Class Time Calculation (min) 36 min            Core Videos: Exercise    Move It!  Clinical staff conducted group or individual video education with verbal and written material and guidebook.  Patient learns the recommended Pritikin exercise program. Exercise with the goal of living a long, healthy life. Some of the health benefits of exercise include controlled diabetes, healthier blood pressure levels, improved cholesterol levels, improved heart and lung capacity, improved sleep, and better  body composition. Everyone should speak with their doctor before starting or changing an exercise routine.  Biomechanical Limitations Clinical staff conducted group or individual video education with verbal and written material and guidebook.  Patient learns how biomechanical limitations can impact exercise and how we can mitigate and possibly overcome limitations to have an impactful and balanced exercise routine.  Body Composition Clinical staff conducted group or individual video education with verbal and written material and guidebook.  Patient learns that body composition (ratio of muscle mass to fat mass) is a key component to assessing overall fitness, rather than body weight alone. Increased fat mass, especially visceral belly fat, can put [place] at increased risk for metabolic syndrome, type 2 diabetes, heart disease, and even death. It is recommended to combine diet and exercise (cardiovascular and resistance training) to improve your body composition. Seek guidance from your physician and exercise physiologist before implementing an exercise routine.  Exercise Action Plan Clinical staff conducted  group or individual video education with verbal and written material and guidebook.  Patient learns the recommended strategies to achieve and enjoy long-term exercise adherence, including variety, self-motivation, self-efficacy, and positive decision making. Benefits of exercise include fitness, good health, weight management, more energy, better sleep, less stress, and overall well-being.  Medical   Heart Disease Risk Reduction Clinical staff conducted group or individual video education with verbal and written material and guidebook.  Patient learns our heart is our most vital organ as it circulates oxygen, nutrients, white blood cells, and hormones throughout the entire body, and carries waste away. Data supports a plant-based eating plan like the Pritikin Program for its effectiveness in slowing progression of and reversing heart disease. The video provides a number of recommendations to address heart disease.   Metabolic Syndrome and Belly Fat  Clinical staff conducted group or individual video education with verbal and written material and guidebook.  Patient learns what metabolic syndrome is, how it leads to heart disease, and how one can reverse it and keep it from coming back. You have metabolic syndrome if you have 3 of the following 5 criteria: abdominal obesity, high blood pressure, high triglycerides, low HDL cholesterol, and high blood sugar.  Hypertension and Heart Disease Clinical staff conducted group or individual video education with verbal and written material and guidebook.  Patient learns that high blood pressure, or hypertension, is very common in the [place]. Hypertension is largely due to excessive salt intake, but other important risk factors include being overweight, physical inactivity, drinking too much alcohol, smoking, and not eating enough potassium from fruits and vegetables. High blood pressure is a leading risk factor for heart attack, stroke, congestive heart  failure, dementia, kidney failure, and premature death. Long-term effects of excessive salt intake include stiffening of the arteries and thickening of heart muscle and organ damage. Recommendations include ways to reduce hypertension and the risk of heart disease.  Diseases of Our Time - Focusing on Diabetes Clinical staff conducted group or individual video education with verbal and written material and guidebook.  Patient learns why the best way to stop diseases of our time is prevention, through food and other lifestyle changes. Medicine (such as prescription pills and surgeries) is often only a Band-Aid on the problem, not a long-term solution. Most common diseases of our time include obesity, type 2 diabetes, hypertension, heart disease, and cancer. The Pritikin Program is recommended and has been proven to help reduce, reverse, and/or prevent the damaging effects of metabolic syndrome.  Nutrition  Overview of the Pritikin Eating Plan  Clinical staff conducted group or individual video education with verbal and written material and guidebook.  Patient learns about the [place] for disease risk reduction. The [place] emphasizes a wide variety of unrefined, minimally-processed carbohydrates, like fruits, vegetables, whole grains, and legumes. Go, Caution, and Stop food choices are explained. Plant-based and lean animal proteins are emphasized. Rationale provided for low sodium intake for blood pressure control, low added sugars for blood sugar stabilization, and low added fats and oils for coronary artery disease risk reduction and weight management.  Calorie Density  Clinical staff conducted group or individual video education with verbal and written material and guidebook.  Patient learns about calorie density and how it impacts the Pritikin Eating Plan. Knowing the characteristics of the food you choose will help you decide whether those foods will lead to weight gain or  weight loss, and whether you want to consume more or less of them. Weight loss is usually a side effect of the Pritikin Eating Plan because of its focus on low calorie-dense foods.  Label Reading  Clinical staff conducted group or individual video education with verbal and written material and guidebook.  Patient learns about the Pritikin recommended label reading guidelines and corresponding recommendations regarding calorie density, added sugars, sodium content, and whole grains.  Dining Out - Part 1  Clinical staff conducted group or individual video education with verbal and written material and guidebook.  Patient learns that restaurant meals can be sabotaging because they can be so high in calories, fat, sodium, and/or sugar. Patient learns recommended strategies on how to positively address this and avoid unhealthy pitfalls.  Facts on Fats  Clinical staff conducted group or individual video education with verbal and written material and guidebook.  Patient learns that lifestyle modifications can be just as effective, if not more so, as many medications for lowering your risk of heart disease. A Pritikin lifestyle can help to reduce your risk of inflammation and atherosclerosis (cholesterol build-up, or plaque, in the artery walls). Lifestyle interventions such as dietary choices and physical activity address the cause of atherosclerosis. A review of the types of fats and their impact on blood cholesterol levels, along with dietary recommendations to reduce fat intake is also included.  Nutrition Action Plan  Clinical staff conducted group or individual video education with verbal and written material and guidebook.  Patient learns how to incorporate Pritikin recommendations into their lifestyle. Recommendations include planning and keeping personal health goals in mind as an important part of their success.  Healthy Mind-Set    Healthy Minds, Bodies, Hearts  Clinical staff conducted group  or individual video education with verbal and written material and guidebook.  Patient learns how to identify when they are stressed. Video will discuss the impact of that stress, as well as the many benefits of stress management. Patient will also be introduced to stress management techniques. The way we think, act, and feel has an impact on our hearts.  How Our Thoughts Can Heal Our Hearts  Clinical staff conducted group or individual video education with verbal and written material and guidebook.  Patient learns that negative thoughts can cause depression and anxiety. This can result in negative lifestyle behavior and serious health problems. Cognitive behavioral therapy is an effective method to help control our thoughts in order to change and improve our emotional outlook.  Additional Videos:  Exercise    Improving Performance  Clinical staff conducted group or individual  video education with verbal and written material and guidebook.  Patient learns to use a non-linear approach by alternating intensity levels and lengths of time spent exercising to help burn more calories and lose more body fat. Cardiovascular exercise helps improve heart health, metabolism, hormonal balance, blood sugar control, and recovery from fatigue. Resistance training improves strength, endurance, balance, coordination, reaction time, metabolism, and muscle mass. Flexibility exercise improves circulation, posture, and balance. Seek guidance from your physician and exercise physiologist before implementing an exercise routine and learn your capabilities and proper form for all exercise.  Introduction to Yoga  Clinical staff conducted group or individual video education with verbal and written material and guidebook.  Patient learns about yoga, a discipline of the coming together of mind, breath, and body. The benefits of yoga include improved flexibility, improved range of motion, better posture and core strength,  increased lung function, weight loss, and positive self-image. Yoga's heart health benefits include lowered blood pressure, healthier heart rate, decreased cholesterol and triglyceride levels, improved immune function, and reduced stress. Seek guidance from your physician and exercise physiologist before implementing an exercise routine and learn your capabilities and proper form for all exercise.  Medical   Aging: Enhancing Your Quality of Life  Clinical staff conducted group or individual video education with verbal and written material and guidebook.  Patient learns key strategies and recommendations to stay in good physical health and enhance quality of life, such as prevention strategies, having an advocate, securing a [place], and keeping a list of medications and system for tracking them. It also discusses how to avoid risk for bone loss.  Biology of Weight Control  Clinical staff conducted group or individual video education with verbal and written material and guidebook.  Patient learns that weight gain occurs because we consume more calories than we burn (eating more, moving less). Even if your body weight is normal, you may have higher ratios of fat compared to muscle mass. Too much body fat puts you at increased risk for cardiovascular disease, heart attack, stroke, type 2 diabetes, and obesity-related cancers. In addition to exercise, following the [place] can help reduce your risk.  Decoding Lab Results  Clinical staff conducted group or individual video education with verbal and written material and guidebook.  Patient learns that lab test reflects one measurement whose values change over time and are influenced by many factors, including medication, stress, sleep, exercise, food, hydration, pre-existing medical conditions, and more. It is recommended to use the knowledge from this video to become more involved with your lab results and  evaluate your numbers to speak with your doctor.   Diseases of Our Time - Overview  Clinical staff conducted group or individual video education with verbal and written material and guidebook.  Patient learns that according to the CDC, 50% to 70% of chronic diseases (such as obesity, type 2 diabetes, elevated lipids, hypertension, and heart disease) are avoidable through lifestyle improvements including healthier food choices, listening to satiety cues, and increased physical activity.  Sleep Disorders Clinical staff conducted group or individual video education with verbal and written material and guidebook.  Patient learns how good quality and duration of sleep are important to overall health and well-being. Patient also learns about sleep disorders and how they impact health along with recommendations to address them, including discussing with a physician.  Nutrition  Dining Out - Part 2 Clinical staff conducted group or individual video education with verbal and written  material and guidebook.  Patient learns how to plan ahead and communicate in order to maximize their dining experience in a healthy and nutritious manner. Included are recommended food choices based on the type of restaurant the patient is visiting.   Fueling a Best boy conducted group or individual video education with verbal and written material and guidebook.  There is a strong connection between our food choices and our health. Diseases like obesity and type 2 diabetes are very prevalent and are in large-part due to lifestyle choices. The Pritikin Eating Plan provides plenty of food and hunger-curbing satisfaction. It is easy to follow, affordable, and helps reduce health risks.  Menu Workshop  Clinical staff conducted group or individual video education with verbal and written material and guidebook.  Patient learns that restaurant meals can sabotage health goals because they are often packed with  calories, fat, sodium, and sugar. Recommendations include strategies to plan ahead and to communicate with the manager, chef, or server to help order a healthier meal.  Planning Your Eating Strategy  Clinical staff conducted group or individual video education with verbal and written material and guidebook.  Patient learns about the [place] and its benefit of reducing the risk of disease. The [place] does not focus on calories. Instead, it emphasizes high-quality, nutrient-rich foods. By knowing the characteristics of the foods, we choose, we can determine their calorie density and make informed decisions.  Targeting Your Nutrition Priorities  Clinical staff conducted group or individual video education with verbal and written material and guidebook.  Patient learns that lifestyle habits have a tremendous impact on disease risk and progression. This video provides eating and physical activity recommendations based on your personal health goals, such as reducing LDL cholesterol, losing weight, preventing or controlling type 2 diabetes, and reducing high blood pressure.  Vitamins and Minerals  Clinical staff conducted group or individual video education with verbal and written material and guidebook.  Patient learns different ways to obtain key vitamins and minerals, including through a recommended healthy diet. It is important to discuss all supplements you take with your doctor.   Healthy Mind-Set    Smoking Cessation  Clinical staff conducted group or individual video education with verbal and written material and guidebook.  Patient learns that cigarette smoking and tobacco addiction pose a serious health risk which affects millions of people. Stopping smoking will significantly reduce the risk of heart disease, lung disease, and many forms of cancer. Recommended strategies for quitting are covered, including working with your doctor to develop a successful  plan.  Culinary   Becoming a Financial trader conducted group or individual video education with verbal and written material and guidebook.  Patient learns that cooking at home can be healthy, cost-effective, quick, and puts them in control. Keys to cooking healthy recipes will include looking at your recipe, assessing your equipment needs, planning ahead, making it simple, choosing cost-effective seasonal ingredients, and limiting the use of added fats, salts, and sugars.  Cooking - Breakfast and Snacks  Clinical staff conducted group or individual video education with verbal and written material and guidebook.  Patient learns how important breakfast is to satiety and nutrition through the entire day. Recommendations include key foods to eat during breakfast to help stabilize blood sugar levels and to prevent overeating at meals later in the day. Planning ahead is also a key component.  Cooking - Human resources officer conducted group or individual  video education with verbal and written material and guidebook.  Patient learns eating strategies to improve overall health, including an approach to cook more at home. Recommendations include thinking of animal protein as a side on your plate rather than center stage and focusing instead on lower calorie dense options like vegetables, fruits, whole grains, and plant-based proteins, such as beans. Making sauces in large quantities to freeze for later and leaving the skin on your vegetables are also recommended to maximize your experience.  Cooking - Healthy Salads and Dressing Clinical staff conducted group or individual video education with verbal and written material and guidebook.  Patient learns that vegetables, fruits, whole grains, and legumes are the foundations of the [place]. Recommendations include how to incorporate each of these in flavorful and healthy salads, and how to create homemade salad dressings.  Proper handling of ingredients is also covered. Cooking - Soups and [employer] - Soups and Desserts Clinical staff conducted group or individual video education with verbal and written material and guidebook.  Patient learns that Pritikin soups and desserts make for easy, nutritious, and delicious snacks and meal components that are low in sodium, fat, sugar, and calorie density, while high in vitamins, minerals, and filling fiber. Recommendations include simple and healthy ideas for soups and desserts.   Overview     The Pritikin Solution Program Overview Clinical staff conducted group or individual video education with verbal and written material and guidebook.  Patient learns that the results of the [place] Program have been documented in more than 100 articles published in peer-reviewed journals, and the benefits include reducing risk factors for (and, in some cases, even reversing) high cholesterol, high blood pressure, type 2 diabetes, obesity, and more! An overview of the three key pillars of the Pritikin Program will be covered: eating well, doing regular exercise, and having a healthy mind-set.  WORKSHOPS  Exercise: Exercise Basics: Building Your Action Plan Clinical staff led group instruction and group discussion with PowerPoint presentation and patient guidebook. To enhance the learning environment the use of posters, models and videos may be added. At the conclusion of this workshop, patients will comprehend the difference between physical activity and exercise, as well as the benefits of incorporating both, into their routine. Patients will understand the FITT (Frequency, Intensity, Time, and Type) principle and how to use it to build an exercise action plan. In addition, safety concerns and other considerations for exercise and cardiac rehab will be addressed by the presenter. The purpose of this lesson is to promote a comprehensive and effective weekly exercise routine in  order to improve patients' overall level of fitness.   Managing Heart Disease: Your Path to a Healthier Heart Clinical staff led group instruction and group discussion with PowerPoint presentation and patient guidebook. To enhance the learning environment the use of posters, models and videos may be added.At the conclusion of this workshop, patients will understand the anatomy and physiology of the heart. Additionally, they will understand how Pritikin's three pillars impact the risk factors, the progression, and the management of heart disease.  The purpose of this lesson is to provide a high-level overview of the heart, heart disease, and how the Pritikin lifestyle positively impacts risk factors.  Exercise Biomechanics Clinical staff led group instruction and group discussion with PowerPoint presentation and patient guidebook. To enhance the learning environment the use of posters, models and videos may be added. Patients will learn how the structural parts of their bodies function and how these  functions impact their daily activities, movement, and exercise. Patients will learn how to promote a neutral spine, learn how to manage pain, and identify ways to improve their physical movement in order to promote healthy living. The purpose of this lesson is to expose patients to common physical limitations that impact physical activity. Participants will learn practical ways to adapt and manage aches and pains, and to minimize their effect on regular exercise. Patients will learn how to maintain good posture while sitting, walking, and lifting.  Balance Training and Fall Prevention  Clinical staff led group instruction and group discussion with PowerPoint presentation and patient guidebook. To enhance the learning environment the use of posters, models and videos may be added. At the conclusion of this workshop, patients will understand the importance of their sensorimotor skills (vision,  proprioception, and the vestibular system) in maintaining their ability to balance as they age. Patients will apply a variety of balancing exercises that are appropriate for their current level of function. Patients will understand the common causes for poor balance, possible solutions to these problems, and ways to modify their physical environment in order to minimize their fall risk. The purpose of this lesson is to teach patients about the importance of maintaining balance as they age and ways to minimize their risk of falling.  WORKSHOPS   Nutrition:  Fueling a Scientist, research (physical sciences) led group instruction and group discussion with PowerPoint presentation and patient guidebook. To enhance the learning environment the use of posters, models and videos may be added. Patients will review the foundational principles of the [place] and understand what constitutes a serving size in each of the food groups. Patients will also learn Pritikin-friendly foods that are better choices when away from home and review make-ahead meal and snack options. Calorie density will be reviewed and applied to three nutrition priorities: weight maintenance, weight loss, and weight gain. The purpose of this lesson is to reinforce (in a group setting) the key concepts around what patients are recommended to eat and how to apply these guidelines when away from home by planning and selecting Pritikin-friendly options. Patients will understand how calorie density may be adjusted for different weight management goals.  Mindful Eating  Clinical staff led group instruction and group discussion with PowerPoint presentation and patient guidebook. To enhance the learning environment the use of posters, models and videos may be added. Patients will briefly review the concepts of the [place] and the importance of low-calorie dense foods. The concept of mindful eating will be introduced as well as the  importance of paying attention to internal hunger signals. Triggers for non-hunger eating and techniques for dealing with triggers will be explored. The purpose of this lesson is to provide patients with the opportunity to review the basic principles of the [place], discuss the value of eating mindfully and how to measure internal cues of hunger and fullness using the Hunger Scale. Patients will also discuss reasons for non-hunger eating and learn strategies to use for controlling emotional eating.  Targeting Your Nutrition Priorities Clinical staff led group instruction and group discussion with PowerPoint presentation and patient guidebook. To enhance the learning environment the use of posters, models and videos may be added. Patients will learn how to determine their genetic susceptibility to disease by reviewing their family history. Patients will gain insight into the importance of diet as part of an overall healthy lifestyle in mitigating the impact of genetics and other environmental insults. The purpose  of this lesson is to provide patients with the opportunity to assess their personal nutrition priorities by looking at their family history, their own health history and current risk factors. Patients will also be able to discuss ways of prioritizing and modifying the [place] for their highest risk areas  Menu  Clinical staff led group instruction and group discussion with PowerPoint presentation and patient guidebook. To enhance the learning environment the use of posters, models and videos may be added. Using menus brought in from [employer], or printed from [employer], patients will apply the [place] dining out guidelines that were presented in the [employer] video. Patients will also be able to practice these guidelines in a variety of provided scenarios. The purpose of this lesson is to provide patients with the opportunity to practice  hands-on learning of the [place] with actual menus and practice scenarios.  Label Reading Clinical staff led group instruction and group discussion with PowerPoint presentation and patient guidebook. To enhance the learning environment the use of posters, models and videos may be added. Patients will review and discuss the Pritikin label reading guidelines presented in Pritikin's Label Reading Educational series video. Using fool labels brought in from local grocery stores and markets, patients will apply the label reading guidelines and determine if the packaged food meet the Pritikin guidelines. The purpose of this lesson is to provide patients with the opportunity to review, discuss, and practice hands-on learning of the Pritikin Label Reading guidelines with actual packaged food labels. [place] Workshops are designed to teach patients ways to prepare quick, simple, and affordable recipes at home. The importance of nutrition's role in chronic disease risk reduction is reflected in its emphasis in the overall Pritikin program. By learning how to prepare essential core Pritikin Eating Plan recipes, patients will increase control over what they eat; be able to customize the flavor of foods without the use of added salt, sugar, or fat; and improve the quality of the food they consume. By learning a set of core recipes which are easily assembled, quickly prepared, and affordable, patients are more likely to prepare more healthy foods at home. These workshops focus on convenient breakfasts, simple entres, side dishes, and desserts which can be prepared with minimal effort and are consistent with nutrition recommendations for cardiovascular risk reduction. Cooking [employer] are taught by a Engineer, materials (RD) who has been trained by the [employer]. The chef or RD has a clear understanding of the importance of minimizing -  if not completely eliminating - added fat, sugar, and sodium in recipes. Throughout the series of [place] Workshop sessions, patients will learn about healthy ingredients and efficient methods of cooking to build confidence in their capability to prepare    Cooking School weekly topics:  Adding Flavor- Sodium-Free  Fast and Healthy Breakfasts  Powerhouse Plant-Based Proteins  Satisfying Salads and Dressings  Simple Sides and Sauces  International Cuisine-Spotlight on the [place] Zones  Delicious Desserts  Savory Soups  [employer] - Meals in a Agricultural consultant Appetizers and Snacks  Comforting Weekend Breakfasts  One-Pot Wonders   Fast Evening Meals  Contractor Your Pritikin Plate  WORKSHOPS   Healthy Mindset (Psychosocial):  Focused Goals, Sustainable Changes Clinical staff led group instruction and group discussion with PowerPoint presentation and patient guidebook. To enhance the learning environment the use of posters, models and videos may be added.  Patients will be able to apply effective goal setting strategies to establish at least one personal goal, and then take consistent, meaningful action toward that goal. They will learn to identify common barriers to achieving personal goals and develop strategies to overcome them. Patients will also gain an understanding of how our mind-set can impact our ability to achieve goals and the importance of cultivating a positive and growth-oriented mind-set. The purpose of this lesson is to provide patients with a deeper understanding of how to set and achieve personal goals, as well as the tools and strategies needed to overcome common obstacles which may arise along the way.  From Head to Heart: The Power of a Healthy Outlook  Clinical staff led group instruction and group discussion with PowerPoint presentation and patient guidebook. To enhance the learning environment the use of posters, models and videos may be  added. Patients will be able to recognize and describe the impact of emotions and mood on physical health. They will discover the importance of self-care and explore self-care practices which may work for them. Patients will also learn how to utilize the 4 C's to cultivate a healthier outlook and better manage stress and challenges. The purpose of this lesson is to demonstrate to patients how a healthy outlook is an essential part of maintaining good health, especially as they continue their cardiac rehab journey.  Healthy Sleep for a Healthy Heart Clinical staff led group instruction and group discussion with PowerPoint presentation and patient guidebook. To enhance the learning environment the use of posters, models and videos may be added. At the conclusion of this workshop, patients will be able to demonstrate knowledge of the importance of sleep to overall health, well-being, and quality of life. They will understand the symptoms of, and treatments for, common sleep disorders. Patients will also be able to identify daytime and nighttime behaviors which impact sleep, and they will be able to apply these tools to help manage sleep-related challenges. The purpose of this lesson is to provide patients with a general overview of sleep and outline the importance of quality sleep. Patients will learn about a few of the most common sleep disorders. Patients will also be introduced to the concept of "sleep hygiene," and discover ways to self-manage certain sleeping problems through simple daily behavior changes. Finally, the workshop will motivate patients by clarifying the links between quality sleep and their goals of heart-healthy living.   Recognizing and Reducing Stress Clinical staff led group instruction and group discussion with PowerPoint presentation and patient guidebook. To enhance the learning environment the use of posters, models and videos may be added. At the conclusion of this workshop, patients  will be able to understand the types of stress reactions, differentiate between acute and chronic stress, and recognize the impact that chronic stress has on their health. They will also be able to apply different coping mechanisms, such as reframing negative self-talk. Patients will have the opportunity to practice a variety of stress management techniques, such as deep abdominal breathing, progressive muscle relaxation, and/or guided imagery.  The purpose of this lesson is to educate patients on the role of stress in their lives and to provide healthy techniques for coping with it.  Learning Barriers/Preferences:  Learning Barriers/Preferences - 05/28/22 1249       Learning Barriers/Preferences   Learning Barriers Sight   wears glasses   Learning Preferences Skilled Demonstration;Individual Instruction             Education Topics:  Knowledge Questionnaire  Score:  Knowledge Questionnaire Score - 05/28/22 1248       Knowledge Questionnaire Score   Pre Score 21/24             Core Components/Risk Factors/Patient Goals at Admission:  Personal Goals and Risk Factors at Admission - 05/28/22 1248       Core Components/Risk Factors/Patient Goals on Admission    Weight Management Yes;Obesity    Intervention Weight Management: Develop a combined nutrition and exercise program designed to reach desired caloric intake, while maintaining appropriate intake of nutrient and fiber, sodium and fats, and appropriate energy expenditure required for the weight goal.;Weight Management: Provide education and appropriate resources to help participant work on and attain dietary goals.;Weight Management/Obesity: Establish reasonable short term and long term weight goals.    Expected Outcomes Short Term: Continue to assess and modify interventions until short term weight is achieved;Long Term: Adherence to nutrition and physical activity/exercise program aimed toward attainment of established weight  goal;Understanding recommendations for meals to include 15-35% energy as protein, 25-35% energy from fat, 35-60% energy from carbohydrates, less than 200mg  of dietary cholesterol, 20-35 gm of total fiber daily;Understanding of distribution of calorie intake throughout the day with the consumption of 4-5 meals/snacks    Heart Failure Yes    Intervention Provide a combined exercise and nutrition program that is supplemented with education, support and counseling about heart failure. Directed toward relieving symptoms such as shortness of breath, decreased exercise tolerance, and extremity edema.    Expected Outcomes Improve functional capacity of life;Short term: Attendance in program 2-3 days a week with increased exercise capacity. Reported lower sodium intake. Reported increased fruit and vegetable intake. Reports medication compliance.;Short term: Daily weights obtained and reported for increase. Utilizing diuretic protocols set by physician.    Hypertension Yes    Intervention Provide education on lifestyle modifcations including regular physical activity/exercise, weight management, moderate sodium restriction and increased consumption of fresh fruit, vegetables, and low fat dairy, alcohol moderation, and smoking cessation.;Monitor prescription use compliance.    Expected Outcomes Short Term: Continued assessment and intervention until BP is < 140/20mm HG in hypertensive participants. < 130/18mm HG in hypertensive participants with diabetes, heart failure or chronic kidney disease.;Long Term: Maintenance of blood pressure at goal levels.    Lipids Yes    Intervention Provide education and support for participant on nutrition & aerobic/resistive exercise along with prescribed medications to achieve LDL 70mg , HDL >40mg .    Expected Outcomes Short Term: Participant states understanding of desired cholesterol values and is compliant with medications prescribed. Participant is following exercise  prescription and nutrition guidelines.;Long Term: Cholesterol controlled with medications as prescribed, with individualized exercise RX and with personalized nutrition plan. Value goals: LDL < 70mg , HDL > 40 mg.             Core Components/Risk Factors/Patient Goals Review:   Goals and Risk Factor Review     Row Name 06/06/22 1623 07/03/22 1412           Core Components/Risk Factors/Patient Goals Review   Personal Goals Review Weight Management/Obesity;Heart Failure;Stress;Hypertension;Lipids Weight Management/Obesity;Heart Failure;Stress;Hypertension;Lipids      Review Leslie [name] started intensive cardiac rehab on 06/03/22. Leslie [name] is off to a good start to exercise. Vital signs have been stable Leslie [name] is doing well with exercise at  intensive cardiac rehab . Vital signs have been stable. Leslie [name] has lost 1.5 kg since starting the program      Expected Outcomes Leslie [name] will continue to participate in intensive cardiac rehab  for exercise, nutrition and lifestyle modifications Leslie [name] will continue to participate in intensive cardiac rehab for exercise, nutrition and lifestyle modifications               Core Components/Risk Factors/Patient Goals at Discharge (Final Review):   Goals and Risk Factor Review - 07/03/22 1412       Core Components/Risk Factors/Patient Goals Review   Personal Goals Review Weight Management/Obesity;Heart Failure;Stress;Hypertension;Lipids    Review Carlyon Prows is doing well with exercise at  intensive cardiac rehab . Vital signs have been stable. Leslie [name] has lost 1.5 kg since starting the program    Expected Outcomes Leslie [name] will continue to participate in intensive cardiac rehab for exercise, nutrition and lifestyle modifications             ITP Comments:  ITP Comments     Row Name 05/28/22 1103 06/06/22 1603 07/03/22 1409       ITP Comments Medical director - Fransico Him, MD.  Introduction to the [place] Education Program / Intensive Cardiac Rehab.  Inital  orientation packet reviewed with the patient. 30 Day ITP Review. Leslie [name] started intensive cardiac rehab on 06/03/22 and is off to a good start to exercise. 30 Day ITP Review. Leslie [name] has good attendance and participation in  intensive cardiac rehab              Comments: See ITP comments.Harrell Gave RN BSN

## 2022-07-05 ENCOUNTER — Encounter (HOSPITAL_COMMUNITY)
Admission: RE | Admit: 2022-07-05 | Discharge: 2022-07-05 | Disposition: A | Discharge status: Home / Self Care | Payer: PPO | Source: Ambulatory Visit | Attending: Cardiovascular Disease | Admitting: Cardiovascular Disease

## 2022-07-05 DIAGNOSIS — I5022 Chronic systolic (congestive) heart failure: Secondary | ICD-10-CM

## 2022-07-08 ENCOUNTER — Encounter (HOSPITAL_COMMUNITY)
Admission: RE | Admit: 2022-07-08 | Discharge: 2022-07-08 | Disposition: A | Discharge status: Home / Self Care | Payer: PPO | Source: Ambulatory Visit | Attending: Cardiovascular Disease | Admitting: Cardiovascular Disease

## 2022-07-08 DIAGNOSIS — I5022 Chronic systolic (congestive) heart failure: Secondary | ICD-10-CM | POA: Diagnosis not present

## 2022-07-08 NOTE — Progress Notes (Addendum)
Blood pressures noted at 98/60 on the nustep. Patient reported she has a little diarrhea this morning and took an extra lasix on Saturday. Carlyon Prows says she feels a like a wet noodle and felt a little lightheaded on the nustep Heart rate 85. Patient given water. Recheck BP 99/59. I advised [name] that she not continue  exercise today if she does not fee well. Recheck exit BP 117/67. Symptoms resolved after drinking water. Today's vital signs reviewed with on site provider Christen Bame NP. Patient instructed  to monitor weight daily, hydrate well with water, eat nutritious meals on a consistent basis per Christen Bame NP. [name] plans to return to exercise on Wednesday.Harrell Gave RN BSN

## 2022-07-10 ENCOUNTER — Encounter (HOSPITAL_COMMUNITY)
Admission: RE | Admit: 2022-07-10 | Discharge: 2022-07-10 | Disposition: A | Discharge status: Home / Self Care | Payer: PPO | Source: Ambulatory Visit | Attending: Cardiovascular Disease | Admitting: Cardiovascular Disease

## 2022-07-10 DIAGNOSIS — I5022 Chronic systolic (congestive) heart failure: Secondary | ICD-10-CM | POA: Diagnosis not present

## 2022-07-12 ENCOUNTER — Encounter (HOSPITAL_COMMUNITY)
Admission: RE | Admit: 2022-07-12 | Discharge: 2022-07-12 | Disposition: A | Discharge status: Home / Self Care | Payer: PPO | Source: Ambulatory Visit | Attending: Cardiovascular Disease | Admitting: Cardiovascular Disease

## 2022-07-12 DIAGNOSIS — I5022 Chronic systolic (congestive) heart failure: Secondary | ICD-10-CM

## 2022-07-15 ENCOUNTER — Encounter (HOSPITAL_COMMUNITY)
Admission: RE | Admit: 2022-07-15 | Discharge: 2022-07-15 | Disposition: A | Discharge status: Home / Self Care | Payer: PPO | Source: Ambulatory Visit | Attending: Cardiovascular Disease | Admitting: Cardiovascular Disease

## 2022-07-15 DIAGNOSIS — Z5189 Encounter for other specified aftercare: Secondary | ICD-10-CM | POA: Diagnosis not present

## 2022-07-15 DIAGNOSIS — I5022 Chronic systolic (congestive) heart failure: Secondary | ICD-10-CM | POA: Diagnosis not present

## 2022-07-17 ENCOUNTER — Encounter (HOSPITAL_COMMUNITY)
Admission: RE | Admit: 2022-07-17 | Discharge: 2022-07-17 | Disposition: A | Discharge status: Home / Self Care | Payer: PPO | Source: Ambulatory Visit | Attending: Cardiovascular Disease | Admitting: Cardiovascular Disease

## 2022-07-17 DIAGNOSIS — I5022 Chronic systolic (congestive) heart failure: Secondary | ICD-10-CM | POA: Diagnosis not present

## 2022-07-19 ENCOUNTER — Encounter (HOSPITAL_COMMUNITY)
Admission: RE | Admit: 2022-07-19 | Discharge: 2022-07-19 | Disposition: A | Discharge status: Home / Self Care | Payer: PPO | Source: Ambulatory Visit | Attending: Cardiovascular Disease | Admitting: Cardiovascular Disease

## 2022-07-19 DIAGNOSIS — I5022 Chronic systolic (congestive) heart failure: Secondary | ICD-10-CM | POA: Diagnosis not present

## 2022-07-22 ENCOUNTER — Encounter (HOSPITAL_COMMUNITY)
Admission: RE | Admit: 2022-07-22 | Discharge: 2022-07-22 | Disposition: A | Discharge status: Home / Self Care | Payer: PPO | Source: Ambulatory Visit | Attending: Cardiovascular Disease | Admitting: Cardiovascular Disease

## 2022-07-22 DIAGNOSIS — I5022 Chronic systolic (congestive) heart failure: Secondary | ICD-10-CM | POA: Diagnosis not present

## 2022-07-23 DIAGNOSIS — G4733 Obstructive sleep apnea (adult) (pediatric): Secondary | ICD-10-CM | POA: Diagnosis not present

## 2022-07-23 DIAGNOSIS — I10 Essential (primary) hypertension: Secondary | ICD-10-CM | POA: Diagnosis not present

## 2022-07-24 ENCOUNTER — Encounter (HOSPITAL_COMMUNITY)
Admission: RE | Admit: 2022-07-24 | Discharge: 2022-07-24 | Disposition: A | Discharge status: Home / Self Care | Payer: PPO | Source: Ambulatory Visit | Attending: Cardiovascular Disease | Admitting: Cardiovascular Disease

## 2022-07-24 DIAGNOSIS — G4733 Obstructive sleep apnea (adult) (pediatric): Secondary | ICD-10-CM | POA: Diagnosis not present

## 2022-07-24 DIAGNOSIS — I5022 Chronic systolic (congestive) heart failure: Secondary | ICD-10-CM | POA: Diagnosis not present

## 2022-07-25 NOTE — Progress Notes (Signed)
Cardiac Individual Treatment Plan  Patient Details  Name: Leslie [name] MRN: [id number] Date of Birth: 1946-03-02 Referring Provider:   Flowsheet Row INTENSIVE CARDIAC REHAB ORIENT from 05/28/2022 in [facility]  Referring Provider Thurmon Fair, MD       Initial Encounter Date:  Flowsheet Row INTENSIVE CARDIAC REHAB ORIENT from 05/28/2022 in [facility], [facility]  Date 05/28/22       Visit Diagnosis: Heart failure, chronic systolic  Patient's Home Medications on Admission:  Current Outpatient Medications:    acetaminophen (TYLENOL) 650 MG CR tablet, Take 1,300 mg by mouth every 8 (eight) hours as needed for pain., Disp: , Rfl:    amiodarone (PACERONE) 200 MG tablet, Take 1 tablet (200 mg total) by mouth daily., Disp: 90 tablet, Rfl: 3   apixaban (ELIQUIS) 5 MG TABS tablet, Take 1 tablet (5 mg total) by mouth 2 (two) times daily., Disp: 180 tablet, Rfl: 3   ARNUITY ELLIPTA 50 MCG/ACT AEPB, Inhale into the lungs., Disp: , Rfl:    atorvastatin (LIPITOR) 40 MG tablet, Take 1 tablet (40 mg total) by mouth daily., Disp: 90 tablet, Rfl: 3   Bacillus Coagulans-Inulin (ALIGN PREBIOTIC-PROBIOTIC PO), Take 2 capsules by mouth daily. Gummy, Disp: , Rfl:    cholecalciferol (VITAMIN D) 1000 UNITS tablet, Take 1,000 Units by mouth daily., Disp: , Rfl:    diclofenac Sodium (VOLTAREN) 1 % GEL, Apply 1 application topically 2 (two) times daily., Disp: , Rfl:    fexofenadine (ALLEGRA) 180 MG tablet, Take 180 mg by mouth daily., Disp: , Rfl:    fluticasone (FLONASE) 50 MCG/ACT nasal spray, Place 2 sprays into both nostrils at bedtime. , Disp: , Rfl:    furosemide (LASIX) 40 MG tablet, Take 1 tablet (40 mg total) by mouth as needed. For weight greater than 261 lbs, Disp: 30 tablet, Rfl: 3   Glucosamine-Chondroitin (OSTEO BI-FLEX REGULAR STRENGTH PO), Take 1 tablet by mouth daily., Disp: , Rfl:     levothyroxine (SYNTHROID, LEVOTHROID) 50 MCG tablet, Take 50 mcg by mouth daily before breakfast., Disp: , Rfl:    magnesium oxide (MAG-OX) 400 (240 Mg) MG tablet, Take 1 tablet (400 mg total) by mouth daily., Disp: 90 tablet, Rfl: 3   metoprolol succinate (TOPROL-XL) 100 MG 24 hr tablet, Take 100 mg (one tablet) in the morning and 50 mg (half a tablet) in the evening., Disp: 135 tablet, Rfl: 3   metroNIDAZOLE (METROCREAM) 0.75 % cream, Apply 1 application  topically daily as needed., Disp: , Rfl:    mometasone (ELOCON) 0.1 % cream, Apply 1 application topically 2 (two) times daily as needed (skin irritation)., Disp: , Rfl:    Multiple Vitamins-Minerals (CENTRUM MULTIGUMMIES) CHEW, Chew 1 tablet by mouth daily., Disp: , Rfl:    omeprazole (PRILOSEC) 20 MG capsule, Take 40 mg by mouth daily before breakfast., Disp: , Rfl:    sacubitril-valsartan (ENTRESTO) 49-51 MG, Take 1 tablet by mouth 2 (two) times daily., Disp: 60 tablet, Rfl: 11   senna (SENOKOT) 8.6 MG tablet, Take 1 tablet by mouth every evening., Disp: , Rfl:   Past Medical History: Past Medical History:  Diagnosis Date   Adenomatous polyp    Arthritis    "knees, legs, fingers" (07/30/2016)   Asthma    Bursitis of left shoulder    "just finished PT" (07/30/2016)   Chest pain    a. 2003 Abnl stress test-->Cath: nonobs CAD.  CHF (congestive heart failure) (HCC)    Chronic bronchitis (HCC)    Chronic kidney disease    stage 3   GERD (gastroesophageal reflux disease)    barrett's esophagus- Dr. Matthias Hughs   History of hiatal hernia    Hyperlipidemia    Hypertension    Hypothyroidism    Migraines    "sporatic; at least a few/year" (07/30/2016)   Mitral regurgitation    a. 07/2016 Echo: mild to mod MR;  b. 07/2016 TEE: mild MR.   Moderate aortic insufficiency    a. 07/2016 Echo: EF 55-60%, mod AI;  b. 07/2016 TEE: EF 50-55%, mod AI.   Obesity    s/p lap band surgery 6/10   OSA on CPAP    PAF (paroxysmal atrial fibrillation) (HCC)     a. 07/2016 TEE/DCCV: EF 50-55%, mild MR, mod AI, mild to mod TR, neg bubble study-->successful DCCV x1 w/ 120J.   PONV (postoperative nausea and vomiting)    Vitamin D deficiency     Tobacco Use: Social History   Tobacco Use  Smoking Status Former  Smokeless Tobacco Never  Tobacco Comments   07/30/2016 "social smoker only; in college"    Labs: Review Flowsheet  More data exists      Latest Ref Rng & Units 07/31/2016 06/24/2019 05/09/2020 09/09/2020 04/11/2022  Labs for ITP Cardiac and Pulmonary Rehab  Cholestrol 0 - 200 mg/dL 161  - - - -  LDL (calc) 0 - 99 mg/dL 79  - - - -  HDL-C >09 mg/dL 32  - - - -  Trlycerides <150 mg/dL 604  - - - -  Hemoglobin A1c 4.8 - 5.6 % 5.6  - - - -  PH, Arterial 7.35 - 7.45 - - - - 7.412   PCO2 arterial 32 - 48 mmHg - - - - 39.0   Bicarbonate 20.0 - 28.0 mmol/L 20.0 - 28.0 mmol/L - - - - 26.5  27.4  24.9   TCO2 22 - 32 mmol/L 22 - 32 mmol/L - 23  22  19  28  29  26    O2 Saturation % % - - - - 62  62  92     Capillary Blood Glucose: Lab Results  Component Value Date   GLUCAP 135 (H) 09/09/2020     Exercise Target Goals: Exercise Program Goal: Individual exercise prescription set using results from initial 6 min walk test and THRR while considering  patient's activity barriers and safety.   Exercise Prescription Goal: Initial exercise prescription builds to 30-45 minutes a day of aerobic activity, 2-3 days per week.  Home exercise guidelines will be given to patient during program as part of exercise prescription that the participant will acknowledge.  Activity Barriers & Risk Stratification:  Activity Barriers & Cardiac Risk Stratification - 05/28/22 1259       Activity Barriers & Cardiac Risk Stratification   Activity Barriers Arthritis;Neck/Spine Problems;Joint Problems;Deconditioning;Muscular Weakness;Decreased Ventricular Function;Balance Concerns    Cardiac Risk Stratification High             6 Minute Walk:  6 Minute  Walk     Row Name 05/28/22 1256         6 Minute Walk   Phase Initial  Nustep used     Distance 1574 feet  Nustep used     Walk Time 6 minutes     # of Rest Breaks 0     MPH 3     METS 2.2  RPE 11     Perceived Dyspnea  0     VO2 Peak 7.1     Symptoms Yes (comment)     Comments Rt shoulder apin 6/10; right knee pain 6/10; left knee pain 5/10; left shoulder pain 4/10; left foot pain 5/10     Resting HR 68 bpm     Resting BP 110/64     Resting Oxygen Saturation  98 %     Exercise Oxygen Saturation  during 6 min walk 97 %     Max Ex. HR 100 bpm     Max Ex. BP 114/74     2 Minute Post BP 104/70              Oxygen Initial Assessment:   Oxygen Re-Evaluation:   Oxygen Discharge (Final Oxygen Re-Evaluation):   Initial Exercise Prescription:  Initial Exercise Prescription - 05/28/22 1300       Date of Initial Exercise RX and Referring Provider   Date 05/28/22    Referring Provider Thurmon Fair, MD    Expected Discharge Date 08/09/22      T5 Nustep   Level 1    SPM 75    Minutes 25    METs 2      Prescription Details   Frequency (times per week) 3    Duration Progress to 30 minutes of continuous aerobic without signs/symptoms of physical distress      Intensity   THRR 40-80% of Max Heartrate 58 - 115    Ratings of Perceived Exertion 11-13    Perceived Dyspnea 0-4      Progression   Progression Continue progressive overload as per policy without signs/symptoms or physical distress.      Resistance Training   Training Prescription Yes    Weight 2 lbs    Reps 10-15             Perform Capillary Blood Glucose checks as needed.  Exercise Prescription Changes:   Exercise Prescription Changes     Row Name 06/03/22 1500 06/19/22 1400 06/28/22 1518 07/17/22 1500       Response to Exercise   Blood Pressure (Admit) 132/70 116/64 116/70 112/76    Blood Pressure (Exercise) 156/70 120/66 128/80 142/58    Blood Pressure (Exit) 128/64 112/60  100/54 98/64    Heart Rate (Admit) 73 bpm 80 bpm 62 bpm 69 bpm    Heart Rate (Exercise) 101 bpm 121 bpm 85 bpm 98 bpm    Heart Rate (Exit) 79 bpm 69 bpm 71 bpm 64 bpm    Rating of Perceived Exertion (Exercise) 12 11 11 12     Symptoms chronic bilateral knee pain (3/10) on Nustep None None Left knee pain    Comments Pt's first day in the CRP2 program Reviewed METs Reviewed MET and goals Reviewed METs    Duration Progress to 30 minutes of  aerobic without signs/symptoms of physical distress Continue with 30 min of aerobic exercise without signs/symptoms of physical distress. Continue with 30 min of aerobic exercise without signs/symptoms of physical distress. Progress to 30 minutes of  aerobic without signs/symptoms of physical distress    Intensity THRR unchanged THRR unchanged THRR unchanged THRR unchanged      Progression   Progression Continue to progress workloads to maintain intensity without signs/symptoms of physical distress. Continue to progress workloads to maintain intensity without signs/symptoms of physical distress. Continue to progress workloads to maintain intensity without signs/symptoms of physical distress. Continue to progress workloads to maintain  intensity without signs/symptoms of physical distress.    Average METs 2.1 2.6 2.6 2.6      Resistance Training   Training Prescription Yes No Yes No    Weight 2 lbs No weights on wednesdays 2 lbs No weights on Wednesdays    Reps 10-15 -- 10-15 --    Time 10 Minutes -- 10 Minutes --      Interval Training   Interval Training No No No No      T5 Nustep   Level 1 1 1 2     SPM 88 107 110 112    Minutes 25 28 30 20     METs 2.1 2.6 2.6 2.6             Exercise Comments:   Exercise Comments     Row Name 06/03/22 1550 06/19/22 1440 07/02/22 1521 07/17/22 1500     Exercise Comments Pt's first day in the CRP2 program. Pt had no complaints other than chronic bilateral knee pain Reviewed METs with patient today. Pt is  progressing duration and instensity. Reviewed METs and goals. Will attempt to increase workload on Nustep next week. Reviewed METs with patient. Pt only conpleted 20 minutes due to knee issue. Hopefully pt will be able to resume 30 minutes nexet exercise session.             Exercise Goals and Review:   Exercise Goals     Row Name 05/28/22 1259             Exercise Goals   Increase Physical Activity Yes       Intervention Provide advice, education, support and counseling about physical activity/exercise needs.;Develop an individualized exercise prescription for aerobic and resistive training based on initial evaluation findings, risk stratification, comorbidities and participant's personal goals.       Expected Outcomes Short Term: Attend rehab on a regular basis to increase amount of physical activity.;Long Term: Add in home exercise to make exercise part of routine and to increase amount of physical activity.;Long Term: Exercising regularly at least 3-5 days a week.       Increase Strength and Stamina Yes       Intervention Provide advice, education, support and counseling about physical activity/exercise needs.;Develop an individualized exercise prescription for aerobic and resistive training based on initial evaluation findings, risk stratification, comorbidities and participant's personal goals.       Expected Outcomes Short Term: Increase workloads from initial exercise prescription for resistance, speed, and METs.;Short Term: Perform resistance training exercises routinely during rehab and add in resistance training at home;Long Term: Improve cardiorespiratory fitness, muscular endurance and strength as measured by increased METs and functional capacity ( )       Able to understand and use rate of perceived exertion (RPE) scale Yes       Intervention Provide education and explanation on how to use RPE scale       Expected Outcomes Short Term: Able to use RPE daily in rehab to  express subjective intensity level;Long Term:  Able to use RPE to guide intensity level when exercising independently       Knowledge and understanding of Target Heart Rate Range (THRR) Yes       Intervention Provide education and explanation of THRR including how the numbers were predicted and where they are located for reference       Expected Outcomes Short Term: Able to state/look up THRR;Short Term: Able to use daily as guideline for intensity in rehab;Long Term: Able  to use THRR to govern intensity when exercising independently       Understanding of Exercise Prescription Yes       Intervention Provide education, explanation, and written materials on patient's individual exercise prescription       Expected Outcomes Short Term: Able to explain program exercise prescription;Long Term: Able to explain home exercise prescription to exercise independently                Exercise Goals Re-Evaluation :  Exercise Goals Re-Evaluation     Row Name 06/03/22 1548 06/28/22 1520           Exercise Goal Re-Evaluation   Exercise Goals Review Increase Physical Activity;Increase Strength and Stamina;Able to understand and use rate of perceived exertion (RPE) scale;Knowledge and understanding of Target Heart Rate Range (THRR);Understanding of Exercise Prescription Increase Physical Activity;Increase Strength and Stamina;Able to understand and use rate of perceived exertion (RPE) scale;Knowledge and understanding of Target Heart Rate Range (THRR);Understanding of Exercise Prescription      Comments Pt's first day in the CRP2 program. Pt understands the exericse Rx, THRR, and RPE. Reviewed METs and goals. Pt voices she has improved stamina since beginning the CRP2 program.      Expected Outcomes Will continue to monitor patient and progress exercise workloads as tolerated. Will continue to monitor patient and progress exercise workloads as tolerated.               Discharge Exercise Prescription  (Final Exercise Prescription Changes):  Exercise Prescription Changes - 07/17/22 1500       Response to Exercise   Blood Pressure (Admit) 112/76    Blood Pressure (Exercise) 142/58    Blood Pressure (Exit) 98/64    Heart Rate (Admit) 69 bpm    Heart Rate (Exercise) 98 bpm    Heart Rate (Exit) 64 bpm    Rating of Perceived Exertion (Exercise) 12    Symptoms Left knee pain    Comments Reviewed METs    Duration Progress to 30 minutes of  aerobic without signs/symptoms of physical distress    Intensity THRR unchanged      Progression   Progression Continue to progress workloads to maintain intensity without signs/symptoms of physical distress.    Average METs 2.6      Resistance Training   Training Prescription No    Weight No weights on Wednesdays      Interval Training   Interval Training No      T5 Nustep   Level 2    SPM 112    Minutes 20    METs 2.6             Nutrition:  Target Goals: Understanding of nutrition guidelines, daily intake of sodium 1500mg , cholesterol 200mg , calories 30% from fat and 7% or less from saturated fats, daily to have 5 or more servings of fruits and vegetables.  Biometrics:  Pre Biometrics - 05/28/22 1035       Pre Biometrics   Waist Circumference 48 inches    Hip Circumference 55.5 inches    Waist to Hip Ratio 0.86 %    Triceps Skinfold 46 mm    % Body Fat 55.2 %    Grip Strength 20 kg    Flexibility 11.25 in    Single Leg Stand 1.85 seconds              Nutrition Therapy Plan and Nutrition Goals:  Nutrition Therapy & Goals - 07/01/22 1610  Nutrition Therapy   Diet Heart healthy Diet    Drug/Food Interactions Statins/Certain Fruits      Personal Nutrition Goals   Nutrition Goal Patient to identify strategies for reducing cardiovascular risk by attending the weekly [facility] education and nutrition series    Personal Goal #2 Patient to improve diet quality by using the plate method as a daily guide for meal  planning to include lean protein/plant protein, fruits, vegetables, whole grains, nonfat dairy as part of well balanced diet    Personal Goal #3 Patient to limit sodium to 1500mg  per day    Comments Goals in action. Leslie [name] reports increased intake of fruits, vegetables, whole grains. She continues to attend the [facility] education and nutrition series regularly. She has maintained her weight since starting with our program. Leslie [name] will benefit from partication in intensive cardiac rehab for nutrition, exercise, and lifestyle modification.      Intervention Plan   Intervention Prescribe, educate and counsel regarding individualized specific dietary modifications aiming towards targeted core components such as weight, hypertension, lipid management, diabetes, heart failure and other comorbidities.;Nutrition handout(s) given to patient.    Expected Outcomes Short Term Goal: Understand basic principles of dietary content, such as calories, fat, sodium, cholesterol and nutrients.;Long Term Goal: Adherence to prescribed nutrition plan.             Nutrition Assessments:  Nutrition Assessments - 06/03/22 1434       Rate Your Plate Scores   Pre Score 64            MEDIFICTS Score Key: ?70 Need to make dietary changes  40-70 Heart Healthy Diet ? 40 Therapeutic Level Cholesterol Diet   Flowsheet Row INTENSIVE CARDIAC REHAB from 06/03/2022 in [facility]ascular, & Lung [facility]  Picture Your Plate Total Score on Admission 64      Picture Your Plate Scores: <16 Unhealthy dietary pattern with much room for improvement. 41-50 Dietary pattern unlikely to meet recommendations for good health and room for improvement. 51-60 More healthful dietary pattern, with some room for improvement.  >60 Healthy dietary pattern, although there may be some specific behaviors that could be improved.    Nutrition Goals Re-Evaluation:  Nutrition Goals Re-Evaluation     Row  Name 06/03/22 1432 07/01/22 0934           Goals   Current Weight 262 lb 2 oz (118.9 kg) 260 lb 2.3 oz (118 kg)      Comment GFR 51, Cr 1.13, A1c 5.9, triglycerides 200, LDL 104 GFR 51, Cr 1.13, LipoproteinA 155      Expected Outcome Leslie [name] will benefit from partication in intensive cardiac rehab for nutrition, exercise, and lifestyle modification. Goals in action. Leslie [name] reports increased intake of fruits, vegetables, whole grains. She continues to attend the [facility] education and nutrition series regularly. She has maintained her weight since starting with our program. We discussed some substitutions for reducing added sugar (example coffee creamer, etc). Leslie [name] will benefit from partication in intensive cardiac rehab for nutrition, exercise, and lifestyle modification.               Nutrition Goals Re-Evaluation:  Nutrition Goals Re-Evaluation     Row Name 06/03/22 1432 07/01/22 0934           Goals   Current Weight 262 lb 2 oz (118.9 kg) 260 lb 2.3 oz (118 kg)      Comment GFR 51, Cr 1.13, A1c 5.9, triglycerides 200, LDL 104  GFR 51, Cr 1.13, LipoproteinA 155      Expected Outcome Leslie [name] will benefit from partication in intensive cardiac rehab for nutrition, exercise, and lifestyle modification. Goals in action. Leslie [name] reports increased intake of fruits, vegetables, whole grains. She continues to attend the [facility] education and nutrition series regularly. She has maintained her weight since starting with our program. We discussed some substitutions for reducing added sugar (example coffee creamer, etc). Leslie [name] will benefit from partication in intensive cardiac rehab for nutrition, exercise, and lifestyle modification.               Nutrition Goals Discharge (Final Nutrition Goals Re-Evaluation):  Nutrition Goals Re-Evaluation - 07/01/22 0934       Goals   Current Weight 260 lb 2.3 oz (118 kg)    Comment GFR 51, Cr 1.13, LipoproteinA 155    Expected Outcome Goals in action.  Leslie [name] reports increased intake of fruits, vegetables, whole grains. She continues to attend the [facility] education and nutrition series regularly. She has maintained her weight since starting with our program. We discussed some substitutions for reducing added sugar (example coffee creamer, etc). Leslie [name] will benefit from partication in intensive cardiac rehab for nutrition, exercise, and lifestyle modification.             Psychosocial: Target Goals: Acknowledge presence or absence of significant depression and/or stress, maximize coping skills, provide positive support system. Participant is able to verbalize types and ability to use techniques and skills needed for reducing stress and depression.  Initial Review & Psychosocial Screening:  Initial Psych Review & Screening - 05/28/22 1622       Initial Review   Current issues with Current Sleep Concerns;Current Anxiety/Panic      Family Dynamics   Good Support System? Yes   Leslie [name] lives alone she has friends who live in the area and a daughter for support who lives in [place]     Barriers   Psychosocial barriers to participate in program The patient should benefit from training in stress management and relaxation.      Screening Interventions   Interventions Encouraged to exercise             Quality of Life Scores:  Quality of Life - 05/28/22 1248       Quality of Life   Select Quality of Life      Quality of Life Scores   Health/Function Pre 21.39 %    Socioeconomic Pre 30 %    Psych/Spiritual Pre 23.79 %    Family Pre 27 %    GLOBAL Pre 24.32 %            Scores of 19 and below usually indicate a poorer quality of life in these areas.  A difference of  2-3 points is a clinically meaningful difference.  A difference of 2-3 points in the total score of the Quality of Life Index has been associated with significant improvement in overall quality of life, self-image, physical symptoms, and general health in studies  assessing change in quality of life.  PHQ-9: Review Flowsheet       05/28/2022 02/22/2022  Depression screen PHQ 2/9  Decreased Interest 0 0  Down, Depressed, Hopeless 0 0  PHQ - 2 Score 0 0  Altered sleeping 2 -  Tired, decreased energy 1 -  Change in appetite 0 -  Feeling bad or failure about yourself  0 -  Trouble concentrating 0 -  Moving slowly or fidgety/restless 0 -  Suicidal  thoughts 0 -  PHQ-9 Score 3 -  Difficult doing work/chores Not difficult at all -   Interpretation of Total Score  Total Score Depression Severity:  1-4 = Minimal depression, 5-9 = Mild depression, 10-14 = Moderate depression, 15-19 = Moderately severe depression, 20-27 = Severe depression   Psychosocial Evaluation and Intervention:   Psychosocial Re-Evaluation:  Psychosocial Re-Evaluation     Row Name 06/06/22 1606 07/03/22 1410 07/25/22 1707         Psychosocial Re-Evaluation   Current issues with Current Stress Concerns;Current Anxiety/Panic Current Stress Concerns;Current Anxiety/Panic Current Stress Concerns;Current Anxiety/Panic     Comments Leslie [name] started intensive cardiac rehab on 06/03/22 and did not voice any increased concerns or stressors on her first day of exercise.Leslie [name] admits to expereincing some anxiety due to her recent cardiac event/ diagnosis Leslie [name] has not voiced  any increased concerns or stressors at intensive cardiac rehab Leslie [name]  continues not to voice any increased concerns or stressors at intensive cardiac rehab     Expected Outcomes Leslie [name] will have decreased or controlled anxiety upon completion of intensive cardiac rehab Leslie [name] will have decreased or controlled anxiety upon completion of intensive cardiac rehab Leslie [name] will have decreased or controlled anxiety upon completion of intensive cardiac rehab     Interventions Stress management education;Encouraged to attend Cardiac Rehabilitation for the exercise;Relaxation education Stress management education;Encouraged to attend  Cardiac Rehabilitation for the exercise;Relaxation education Stress management education;Encouraged to attend Cardiac Rehabilitation for the exercise;Relaxation education     Continue Psychosocial Services  Follow up required by staff No Follow up required No Follow up required       Initial Review   Source of Stress Concerns Chronic Illness Chronic Illness Chronic Illness     Comments Will continue to monitor and offer support as needed. Will continue to monitor and offer support as needed. Will continue to monitor and offer support as needed.              Psychosocial Discharge (Final Psychosocial Re-Evaluation):  Psychosocial Re-Evaluation - 07/25/22 1707       Psychosocial Re-Evaluation   Current issues with Current Stress Concerns;Current Anxiety/Panic    Comments Leslie [name]  continues not to voice any increased concerns or stressors at intensive cardiac rehab    Expected Outcomes Leslie [name] will have decreased or controlled anxiety upon completion of intensive cardiac rehab    Interventions Stress management education;Encouraged to attend Cardiac Rehabilitation for the exercise;Relaxation education    Continue Psychosocial Services  No Follow up required      Initial Review   Source of Stress Concerns Chronic Illness    Comments Will continue to monitor and offer support as needed.             Vocational Rehabilitation: Provide vocational rehab assistance to qualifying candidates.   Vocational Rehab Evaluation & Intervention:  Vocational Rehab - 05/28/22 1624       Initial Vocational Rehab Evaluation & Intervention   Assessment shows need for Vocational Rehabilitation No   Leslie [name] is retired and does not need vocational rehab at this time            Education: Education Goals: Education classes will be provided on a weekly basis, covering required topics. Participant will state understanding/return demonstration of topics presented.    Education     Row Name 06/03/22  1500     Education   Cardiac Education Topics Pritikin   Nurse, children's  Exercise Physiologist   Select Exercise Education   Exercise Education Biomechanial Limitations   Instruction Review Code 1- Verbalizes Understanding   Class Start Time 1406   Class Stop Time 1442   Class Time Calculation (min) 36 min    Row Name 06/05/22 1600     Education   Cardiac Education Topics Pritikin   Customer service manager   Weekly Topic Fast Evening Meals   Instruction Review Code 1- Verbalizes Understanding   Class Start Time 1400   Class Stop Time 1445   Class Time Calculation (min) 45 min    Row Name 06/10/22 1500     Education   Cardiac Education Topics Pritikin   Customer service manager   Weekly Topic International Cuisine- Spotlight on the [employer] Zones   Instruction Review Code 1- Verbalizes Understanding   Class Start Time 1405   Class Stop Time 1453   Class Time Calculation (min) 48 min    Row Name 06/12/22 1500     Education   Cardiac Education Topics Pritikin   Licensed conveyancer Nutrition   Nutrition Facts on Fat   Instruction Review Code 1- Verbalizes Understanding   Class Start Time 1400   Class Stop Time 1450   Class Time Calculation (min) 50 min    Row Name 06/17/22 1600     Education   Cardiac Education Topics Pritikin   Select Workshops     Workshops   Educator Exercise Physiologist   Select Psychosocial   Psychosocial Workshop Other  From [employer] to Heart   Instruction Review Code 1- Verbalizes Understanding   Class Start Time 1400   Class Stop Time 1452   Class Time Calculation (min) 52 min    Row Name 06/21/22 1600     Education   Cardiac Education Topics Pritikin   Hospital doctor Education   General Education Heart  Disease Risk Reduction   Instruction Review Code 1- Verbalizes Understanding   Class Start Time 1355   Class Stop Time 1440   Class Time Calculation (min) 45 min    Row Name 06/24/22 1500     Education   Cardiac Education Topics Pritikin   Geographical information systems officer Exercise   Exercise Workshop Location manager and Fall Prevention   Instruction Review Code 1- Verbalizes Understanding   Class Start Time 1350   Class Stop Time 1432   Class Time Calculation (min) 42 min    Row Name 06/26/22 1500     Education   Cardiac Education Topics Pritikin   Customer service manager   Weekly Topic Fast and Healthy Breakfasts   Instruction Review Code 1- Verbalizes Understanding   Class Start Time 1355   Class Stop Time 1450   Class Time Calculation (min) 55 min    Row Name 06/28/22 1600     Education   Cardiac Education Topics Pritikin   Nurse, children's Dietitian   Nutrition Overview of the Pritikin Eating Plan   Instruction Review Code 1- Verbalizes Understanding   Class Start Time 1400  Class Stop Time 1436   Class Time Calculation (min) 36 min    Row Name 07/03/22 1600     Education   Cardiac Education Topics Pritikin   Customer service manager   Weekly Topic Personalizing Your Pritikin Plate   Instruction Review Code 1- Verbalizes Understanding   Class Start Time 1400   Class Stop Time 1448   Class Time Calculation (min) 48 min    Row Name 07/05/22 1600     Education   Cardiac Education Topics Pritikin   Nurse, children's Exercise Physiologist   Select Psychosocial   Psychosocial Healthy Minds, Bodies, Hearts   Instruction Review Code 1- Verbalizes Understanding   Class Start Time 1405   Class Stop Time 1443   Class Time Calculation (min) 38 min    Row Name 07/10/22 1600      Education   Cardiac Education Topics Pritikin   Orthoptist   Educator Dietitian   Weekly Topic Delicious Desserts   Instruction Review Code 1- Verbalizes Understanding   Class Start Time 1400   Class Stop Time 1446   Class Time Calculation (min) 46 min    Row Name 07/12/22 0843     Education   Cardiac Education Topics Pritikin   Select Core Videos     Core Videos   Educator Dietitian   Select Nutrition   Nutrition Other   Instruction Review Code 1- Verbalizes Understanding   Class Start Time 1400   Class Stop Time 1446   Class Time Calculation (min) 46 min     Cooking School   Instruction Review Code 1- [employer] Understanding    Row Name 07/15/22 1500     Education   Cardiac Education Topics Pritikin   Select Workshops     Workshops   Educator Exercise Physiologist   Select Exercise   Exercise Workshop Exercise Basics: Building Your Action Plan   Instruction Review Code 1- Verbalizes Understanding   Class Start Time 1405   Class Stop Time 1452   Class Time Calculation (min) 47 min    Row Name 07/17/22 1500     Education   Cardiac Education Topics Pritikin   Customer service manager   Weekly Topic Tasty Appetizers and Snacks   Instruction Review Code 1- Verbalizes Understanding   Class Start Time 1400   Class Stop Time 1443   Class Time Calculation (min) 43 min    Row Name 07/19/22 1500     Education   Cardiac Education Topics Pritikin   Licensed conveyancer Nutrition   Nutrition Calorie Density   Instruction Review Code 1- Verbalizes Understanding   Class Start Time 1400   Class Stop Time 1440   Class Time Calculation (min) 40 min    Row Name 07/24/22 1600     Education   Cardiac Education Topics Pritikin   Customer service manager   Weekly Topic Efficiency Cooking - Meals in a Snap    Instruction Review Code 1- Verbalizes Understanding   Class Start Time 1400   Class Stop Time 1445   Class Time Calculation (min) 45 min  Core Videos: Exercise    Move It!  Clinical staff conducted group or individual video education with verbal and written material and guidebook.  Patient learns the recommended Pritikin exercise program. Exercise with the goal of living a long, healthy life. Some of the health benefits of exercise include controlled diabetes, healthier blood pressure levels, improved cholesterol levels, improved heart and lung capacity, improved sleep, and better body composition. Everyone should speak with their doctor before starting or changing an exercise routine.  Biomechanical Limitations Clinical staff conducted group or individual video education with verbal and written material and guidebook.  Patient learns how biomechanical limitations can impact exercise and how we can mitigate and possibly overcome limitations to have an impactful and balanced exercise routine.  Body Composition Clinical staff conducted group or individual video education with verbal and written material and guidebook.  Patient learns that body composition (ratio of muscle mass to fat mass) is a key component to assessing overall fitness, rather than body weight alone. Increased fat mass, especially visceral belly fat, can put [place] at increased risk for metabolic syndrome, type 2 diabetes, heart disease, and even death. It is recommended to combine diet and exercise (cardiovascular and resistance training) to improve your body composition. Seek guidance from your physician and exercise physiologist before implementing an exercise routine.  Exercise Action Plan Clinical staff conducted group or individual video education with verbal and written material and guidebook.  Patient learns the recommended strategies to achieve and enjoy long-term exercise adherence, including variety,  self-motivation, self-efficacy, and positive decision making. Benefits of exercise include fitness, good health, weight management, more energy, better sleep, less stress, and overall well-being.  Medical   Heart Disease Risk Reduction Clinical staff conducted group or individual video education with verbal and written material and guidebook.  Patient learns our heart is our most vital organ as it circulates oxygen, nutrients, white blood cells, and hormones throughout the entire body, and carries waste away. Data supports a plant-based eating plan like the Pritikin Program for its effectiveness in slowing progression of and reversing heart disease. The video provides a number of recommendations to address heart disease.   Metabolic Syndrome and Belly Fat  Clinical staff conducted group or individual video education with verbal and written material and guidebook.  Patient learns what metabolic syndrome is, how it leads to heart disease, and how one can reverse it and keep it from coming back. You have metabolic syndrome if you have 3 of the following 5 criteria: abdominal obesity, high blood pressure, high triglycerides, low HDL cholesterol, and high blood sugar.  Hypertension and Heart Disease Clinical staff conducted group or individual video education with verbal and written material and guidebook.  Patient learns that high blood pressure, or hypertension, is very common in the [place]. Hypertension is largely due to excessive salt intake, but other important risk factors include being overweight, physical inactivity, drinking too much alcohol, smoking, and not eating enough potassium from fruits and vegetables. High blood pressure is a leading risk factor for heart attack, stroke, congestive heart failure, dementia, kidney failure, and premature death. Long-term effects of excessive salt intake include stiffening of the arteries and thickening of heart muscle and organ damage. Recommendations  include ways to reduce hypertension and the risk of heart disease.  Diseases of Our Time - Focusing on Diabetes Clinical staff conducted group or individual video education with verbal and written material and guidebook.  Patient learns why the best way to stop diseases of our time  is prevention, through food and other lifestyle changes. Medicine (such as prescription pills and surgeries) is often only a Band-Aid on the problem, not a long-term solution. Most common diseases of our time include obesity, type 2 diabetes, hypertension, heart disease, and cancer. The Pritikin Program is recommended and has been proven to help reduce, reverse, and/or prevent the damaging effects of metabolic syndrome.  Nutrition   Overview of the Pritikin Eating Plan  Clinical staff conducted group or individual video education with verbal and written material and guidebook.  Patient learns about the Pritikin Eating Plan for disease risk reduction. The Pritikin Eating Plan emphasizes a wide variety of unrefined, minimally-processed carbohydrates, like fruits, vegetables, whole grains, and legumes. Go, Caution, and Stop food choices are explained. Plant-based and lean animal proteins are emphasized. Rationale provided for low sodium intake for blood pressure control, low added sugars for blood sugar stabilization, and low added fats and oils for coronary artery disease risk reduction and weight management.  Calorie Density  Clinical staff conducted group or individual video education with verbal and written material and guidebook.  Patient learns about calorie density and how it impacts the Pritikin Eating Plan. Knowing the characteristics of the food you choose will help you decide whether those foods will lead to weight gain or weight loss, and whether you want to consume more or less of them. Weight loss is usually a side effect of the Pritikin Eating Plan because of its focus on low calorie-dense foods.  Label Reading   Clinical staff conducted group or individual video education with verbal and written material and guidebook.  Patient learns about the Pritikin recommended label reading guidelines and corresponding recommendations regarding calorie density, added sugars, sodium content, and whole grains.  Dining Out - Part 1  Clinical staff conducted group or individual video education with verbal and written material and guidebook.  Patient learns that restaurant meals can be sabotaging because they can be so high in calories, fat, sodium, and/or sugar. Patient learns recommended strategies on how to positively address this and avoid unhealthy pitfalls.  Facts on Fats  Clinical staff conducted group or individual video education with verbal and written material and guidebook.  Patient learns that lifestyle modifications can be just as effective, if not more so, as many medications for lowering your risk of heart disease. A Pritikin lifestyle can help to reduce your risk of inflammation and atherosclerosis (cholesterol build-up, or plaque, in the artery walls). Lifestyle interventions such as dietary choices and physical activity address the cause of atherosclerosis. A review of the types of fats and their impact on blood cholesterol levels, along with dietary recommendations to reduce fat intake is also included.  Nutrition Action Plan  Clinical staff conducted group or individual video education with verbal and written material and guidebook.  Patient learns how to incorporate Pritikin recommendations into their lifestyle. Recommendations include planning and keeping personal health goals in mind as an important part of their success.  Healthy Mind-Set    Healthy Minds, Bodies, Hearts  Clinical staff conducted group or individual video education with verbal and written material and guidebook.  Patient learns how to identify when they are stressed. Video will discuss the impact of that stress, as well as the  many benefits of stress management. Patient will also be introduced to stress management techniques. The way we think, act, and feel has an impact on our hearts.  How Our Thoughts Can Heal Our Hearts  Clinical staff conducted group or individual video  education with verbal and written material and guidebook.  Patient learns that negative thoughts can cause depression and anxiety. This can result in negative lifestyle behavior and serious health problems. Cognitive behavioral therapy is an effective method to help control our thoughts in order to change and improve our emotional outlook.  Additional Videos:  Exercise    Improving Performance  Clinical staff conducted group or individual video education with verbal and written material and guidebook.  Patient learns to use a non-linear approach by alternating intensity levels and lengths of time spent exercising to help burn more calories and lose more body fat. Cardiovascular exercise helps improve heart health, metabolism, hormonal balance, blood sugar control, and recovery from fatigue. Resistance training improves strength, endurance, balance, coordination, reaction time, metabolism, and muscle mass. Flexibility exercise improves circulation, posture, and balance. Seek guidance from your physician and exercise physiologist before implementing an exercise routine and learn your capabilities and proper form for all exercise.  Introduction to Yoga  Clinical staff conducted group or individual video education with verbal and written material and guidebook.  Patient learns about yoga, a discipline of the coming together of mind, breath, and body. The benefits of yoga include improved flexibility, improved range of motion, better posture and core strength, increased lung function, weight loss, and positive self-image. Yoga's heart health benefits include lowered blood pressure, healthier heart rate, decreased cholesterol and triglyceride levels, improved  immune function, and reduced stress. Seek guidance from your physician and exercise physiologist before implementing an exercise routine and learn your capabilities and proper form for all exercise.  Medical   Aging: Enhancing Your Quality of Life  Clinical staff conducted group or individual video education with verbal and written material and guidebook.  Patient learns key strategies and recommendations to stay in good physical health and enhance quality of life, such as prevention strategies, having an advocate, securing a Health Care Proxy and Power of Attorney, and keeping a list of medications and system for tracking them. It also discusses how to avoid risk for bone loss.  Biology of Weight Control  Clinical staff conducted group or individual video education with verbal and written material and guidebook.  Patient learns that weight gain occurs because we consume more calories than we burn (eating more, moving less). Even if your body weight is normal, you may have higher ratios of fat compared to muscle mass. Too much body fat puts you at increased risk for cardiovascular disease, heart attack, stroke, type 2 diabetes, and obesity-related cancers. In addition to exercise, following the Pritikin Eating Plan can help reduce your risk.  Decoding Lab Results  Clinical staff conducted group or individual video education with verbal and written material and guidebook.  Patient learns that lab test reflects one measurement whose values change over time and are influenced by many factors, including medication, stress, sleep, exercise, food, hydration, pre-existing medical conditions, and more. It is recommended to use the knowledge from this video to become more involved with your lab results and evaluate your numbers to speak with your doctor.   Diseases of Our Time - Overview  Clinical staff conducted group or individual video education with verbal and written material and guidebook.  Patient  learns that according to the CDC, 50% to 70% of chronic diseases (such as obesity, type 2 diabetes, elevated lipids, hypertension, and heart disease) are avoidable through lifestyle improvements including healthier food choices, listening to satiety cues, and increased physical activity.  Sleep Disorders Clinical staff conducted group or individual  video education with verbal and written material and guidebook.  Patient learns how good quality and duration of sleep are important to overall health and well-being. Patient also learns about sleep disorders and how they impact health along with recommendations to address them, including discussing with a physician.  Nutrition  Dining Out - Part 2 Clinical staff conducted group or individual video education with verbal and written material and guidebook.  Patient learns how to plan ahead and communicate in order to maximize their dining experience in a healthy and nutritious manner. Included are recommended food choices based on the type of restaurant the patient is visiting.   Fueling a Banker conducted group or individual video education with verbal and written material and guidebook.  There is a strong connection between our food choices and our health. Diseases like obesity and type 2 diabetes are very prevalent and are in large-part due to lifestyle choices. The Pritikin Eating Plan provides plenty of food and hunger-curbing satisfaction. It is easy to follow, affordable, and helps reduce health risks.  Menu Workshop  Clinical staff conducted group or individual video education with verbal and written material and guidebook.  Patient learns that restaurant meals can sabotage health goals because they are often packed with calories, fat, sodium, and sugar. Recommendations include strategies to plan ahead and to communicate with the manager, chef, or server to help order a healthier meal.  Planning Your Eating Strategy   Clinical staff conducted group or individual video education with verbal and written material and guidebook.  Patient learns about the Pritikin Eating Plan and its benefit of reducing the risk of disease. The Pritikin Eating Plan does not focus on calories. Instead, it emphasizes high-quality, nutrient-rich foods. By knowing the characteristics of the foods, we choose, we can determine their calorie density and make informed decisions.  Targeting Your Nutrition Priorities  Clinical staff conducted group or individual video education with verbal and written material and guidebook.  Patient learns that lifestyle habits have a tremendous impact on disease risk and progression. This video provides eating and physical activity recommendations based on your personal health goals, such as reducing LDL cholesterol, losing weight, preventing or controlling type 2 diabetes, and reducing high blood pressure.  Vitamins and Minerals  Clinical staff conducted group or individual video education with verbal and written material and guidebook.  Patient learns different ways to obtain key vitamins and minerals, including through a recommended healthy diet. It is important to discuss all supplements you take with your doctor.   Healthy Mind-Set    Smoking Cessation  Clinical staff conducted group or individual video education with verbal and written material and guidebook.  Patient learns that cigarette smoking and tobacco addiction pose a serious health risk which affects millions of people. Stopping smoking will significantly reduce the risk of heart disease, lung disease, and many forms of cancer. Recommended strategies for quitting are covered, including working with your doctor to develop a successful plan.  Culinary   Becoming a Set designer conducted group or individual video education with verbal and written material and guidebook.  Patient learns that cooking at home can be healthy,  cost-effective, quick, and puts them in control. Keys to cooking healthy recipes will include looking at your recipe, assessing your equipment needs, planning ahead, making it simple, choosing cost-effective seasonal ingredients, and limiting the use of added fats, salts, and sugars.  Cooking - Breakfast and Snacks  Clinical staff conducted group or individual  video education with verbal and written material and guidebook.  Patient learns how important breakfast is to satiety and nutrition through the entire day. Recommendations include key foods to eat during breakfast to help stabilize blood sugar levels and to prevent overeating at meals later in the day. Planning ahead is also a key component.  Cooking - Educational psychologist conducted group or individual video education with verbal and written material and guidebook.  Patient learns eating strategies to improve overall health, including an approach to cook more at home. Recommendations include thinking of animal protein as a side on your plate rather than center stage and focusing instead on lower calorie dense options like vegetables, fruits, whole grains, and plant-based proteins, such as beans. Making sauces in large quantities to freeze for later and leaving the skin on your vegetables are also recommended to maximize your experience.  Cooking - Healthy Salads and Dressing Clinical staff conducted group or individual video education with verbal and written material and guidebook.  Patient learns that vegetables, fruits, whole grains, and legumes are the foundations of the Pritikin Eating Plan. Recommendations include how to incorporate each of these in flavorful and healthy salads, and how to create homemade salad dressings. Proper handling of ingredients is also covered. Cooking - Soups and [employer] - Soups and Desserts Clinical staff conducted group or individual video education with verbal and written material and  guidebook.  Patient learns that Pritikin soups and desserts make for easy, nutritious, and delicious snacks and meal components that are low in sodium, fat, sugar, and calorie density, while high in vitamins, minerals, and filling fiber. Recommendations include simple and healthy ideas for soups and desserts.   Overview     The Pritikin Solution Program Overview Clinical staff conducted group or individual video education with verbal and written material and guidebook.  Patient learns that the results of the Pritikin Program have been documented in more than 100 articles published in peer-reviewed journals, and the benefits include reducing risk factors for (and, in some cases, even reversing) high cholesterol, high blood pressure, type 2 diabetes, obesity, and more! An overview of the three key pillars of the Pritikin Program will be covered: eating well, doing regular exercise, and having a healthy mind-set.  WORKSHOPS  Exercise: Exercise Basics: Building Your Action Plan Clinical staff led group instruction and group discussion with PowerPoint presentation and patient guidebook. To enhance the learning environment the use of posters, models and videos may be added. At the conclusion of this workshop, patients will comprehend the difference between physical activity and exercise, as well as the benefits of incorporating both, into their routine. Patients will understand the FITT (Frequency, Intensity, Time, and Type) principle and how to use it to build an exercise action plan. In addition, safety concerns and other considerations for exercise and cardiac rehab will be addressed by the presenter. The purpose of this lesson is to promote a comprehensive and effective weekly exercise routine in order to improve patients' overall level of fitness.   Managing Heart Disease: Your Path to a Healthier Heart Clinical staff led group instruction and group discussion with PowerPoint presentation and  patient guidebook. To enhance the learning environment the use of posters, models and videos may be added.At the conclusion of this workshop, patients will understand the anatomy and physiology of the heart. Additionally, they will understand how Pritikin's three pillars impact the risk factors, the progression, and the management of heart disease.  The purpose of  this lesson is to provide a high-level overview of the heart, heart disease, and how the Pritikin lifestyle positively impacts risk factors.  Exercise Biomechanics Clinical staff led group instruction and group discussion with PowerPoint presentation and patient guidebook. To enhance the learning environment the use of posters, models and videos may be added. Patients will learn how the structural parts of their bodies function and how these functions impact their daily activities, movement, and exercise. Patients will learn how to promote a neutral spine, learn how to manage pain, and identify ways to improve their physical movement in order to promote healthy living. The purpose of this lesson is to expose patients to common physical limitations that impact physical activity. Participants will learn practical ways to adapt and manage aches and pains, and to minimize their effect on regular exercise. Patients will learn how to maintain good posture while sitting, walking, and lifting.  Balance Training and Fall Prevention  Clinical staff led group instruction and group discussion with PowerPoint presentation and patient guidebook. To enhance the learning environment the use of posters, models and videos may be added. At the conclusion of this workshop, patients will understand the importance of their sensorimotor skills (vision, proprioception, and the vestibular system) in maintaining their ability to balance as they age. Patients will apply a variety of balancing exercises that are appropriate for their current level of function.  Patients will understand the common causes for poor balance, possible solutions to these problems, and ways to modify their physical environment in order to minimize their fall risk. The purpose of this lesson is to teach patients about the importance of maintaining balance as they age and ways to minimize their risk of falling.  WORKSHOPS   Nutrition:  Fueling a Ship broker led group instruction and group discussion with PowerPoint presentation and patient guidebook. To enhance the learning environment the use of posters, models and videos may be added. Patients will review the foundational principles of the Pritikin Eating Plan and understand what constitutes a serving size in each of the food groups. Patients will also learn Pritikin-friendly foods that are better choices when away from home and review make-ahead meal and snack options. Calorie density will be reviewed and applied to three nutrition priorities: weight maintenance, weight loss, and weight gain. The purpose of this lesson is to reinforce (in a group setting) the key concepts around what patients are recommended to eat and how to apply these guidelines when away from home by planning and selecting Pritikin-friendly options. Patients will understand how calorie density may be adjusted for different weight management goals.  Mindful Eating  Clinical staff led group instruction and group discussion with PowerPoint presentation and patient guidebook. To enhance the learning environment the use of posters, models and videos may be added. Patients will briefly review the concepts of the Pritikin Eating Plan and the importance of low-calorie dense foods. The concept of mindful eating will be introduced as well as the importance of paying attention to internal hunger signals. Triggers for non-hunger eating and techniques for dealing with triggers will be explored. The purpose of this lesson is to provide patients with the  opportunity to review the basic principles of the Pritikin Eating Plan, discuss the value of eating mindfully and how to measure internal cues of hunger and fullness using the Hunger Scale. Patients will also discuss reasons for non-hunger eating and learn strategies to use for controlling emotional eating.  Targeting Your Nutrition Priorities Clinical staff led group instruction  and group discussion with PowerPoint presentation and patient guidebook. To enhance the learning environment the use of posters, models and videos may be added. Patients will learn how to determine their genetic susceptibility to disease by reviewing their family history. Patients will gain insight into the importance of diet as part of an overall healthy lifestyle in mitigating the impact of genetics and other environmental insults. The purpose of this lesson is to provide patients with the opportunity to assess their personal nutrition priorities by looking at their family history, their own health history and current risk factors. Patients will also be able to discuss ways of prioritizing and modifying the Pritikin Eating Plan for their highest risk areas  Menu  Clinical staff led group instruction and group discussion with PowerPoint presentation and patient guidebook. To enhance the learning environment the use of posters, models and videos may be added. Using menus brought in from [employer], or printed from [employer], patients will apply the Pritikin dining out guidelines that were presented in the [employer] video. Patients will also be able to practice these guidelines in a variety of provided scenarios. The purpose of this lesson is to provide patients with the opportunity to practice hands-on learning of the Pritikin Dining Out guidelines with actual menus and practice scenarios.  Label Reading Clinical staff led group instruction and group discussion with PowerPoint presentation and  patient guidebook. To enhance the learning environment the use of posters, models and videos may be added. Patients will review and discuss the Pritikin label reading guidelines presented in [employer]in's Label Reading Educational series video. Using fool labels brought in from local grocery stores and markets, patients will apply the label reading guidelines and determine if the packaged food meet the Pritikin guidelines. The purpose of this lesson is to provide patients with the opportunity to review, discuss, and practice hands-on learning of the Pritikin Label Reading guidelines with actual packaged food labels. Cooking School  Pritikin's [employer] are designed to teach patients ways to prepare quick, simple, and affordable recipes at home. The importance of nutrition's role in chronic disease risk reduction is reflected in its emphasis in the overall Pritikin program. By learning how to prepare essential core Pritikin Eating Plan recipes, patients will increase control over what they eat; be able to customize the flavor of foods without the use of added salt, sugar, or fat; and improve the quality of the food they consume. By learning a set of core recipes which are easily assembled, quickly prepared, and affordable, patients are more likely to prepare more healthy foods at home. These workshops focus on convenient breakfasts, simple entres, side dishes, and desserts which can be prepared with minimal effort and are consistent with nutrition recommendations for cardiovascular risk reduction. Cooking [employer] are taught by a Armed forces logistics/support/administrative officer (RD) who has been trained by the [employer]. The chef or RD has a clear understanding of the importance of minimizing - if not completely eliminating - added fat, sugar, and sodium in recipes. Throughout the series of Cooking School Workshop sessions, patients will learn about healthy ingredients and efficient methods of  cooking to build confidence in their capability to prepare    Cooking School weekly topics:  Adding Flavor- Sodium-Free  Fast and Healthy Breakfasts  Powerhouse Plant-Based Proteins  Satisfying Salads and Dressings  Simple Sides and Sauces  International Cuisine-Spotlight on the [employer] Zones  Delicious Desserts  Savory Soups  [employer] - Meals in  a Snap  Tasty Appetizers and Snacks  Comforting Weekend Breakfasts  One-Pot Wonders   Fast Evening Meals  Landscape architect Your Pritikin Plate  WORKSHOPS   Healthy Mindset (Psychosocial):  Focused Goals, Sustainable Changes Clinical staff led group instruction and group discussion with PowerPoint presentation and patient guidebook. To enhance the learning environment the use of posters, models and videos may be added. Patients will be able to apply effective goal setting strategies to establish at least one personal goal, and then take consistent, meaningful action toward that goal. They will learn to identify common barriers to achieving personal goals and develop strategies to overcome them. Patients will also gain an understanding of how our mind-set can impact our ability to achieve goals and the importance of cultivating a positive and growth-oriented mind-set. The purpose of this lesson is to provide patients with a deeper understanding of how to set and achieve personal goals, as well as the tools and strategies needed to overcome common obstacles which may arise along the way.  From Head to Heart: The Power of a Healthy Outlook  Clinical staff led group instruction and group discussion with PowerPoint presentation and patient guidebook. To enhance the learning environment the use of posters, models and videos may be added. Patients will be able to recognize and describe the impact of emotions and mood on physical health. They will discover the importance of self-care and explore self-care practices which may work  for them. Patients will also learn how to utilize the 4 C's to cultivate a healthier outlook and better manage stress and challenges. The purpose of this lesson is to demonstrate to patients how a healthy outlook is an essential part of maintaining good health, especially as they continue their cardiac rehab journey.  Healthy Sleep for a Healthy Heart Clinical staff led group instruction and group discussion with PowerPoint presentation and patient guidebook. To enhance the learning environment the use of posters, models and videos may be added. At the conclusion of this workshop, patients will be able to demonstrate knowledge of the importance of sleep to overall health, well-being, and quality of life. They will understand the symptoms of, and treatments for, common sleep disorders. Patients will also be able to identify daytime and nighttime behaviors which impact sleep, and they will be able to apply these tools to help manage sleep-related challenges. The purpose of this lesson is to provide patients with a general overview of sleep and outline the importance of quality sleep. Patients will learn about a few of the most common sleep disorders. Patients will also be introduced to the concept of "sleep hygiene," and discover ways to self-manage certain sleeping problems through simple daily behavior changes. Finally, the workshop will motivate patients by clarifying the links between quality sleep and their goals of heart-healthy living.   Recognizing and Reducing Stress Clinical staff led group instruction and group discussion with PowerPoint presentation and patient guidebook. To enhance the learning environment the use of posters, models and videos may be added. At the conclusion of this workshop, patients will be able to understand the types of stress reactions, differentiate between acute and chronic stress, and recognize the impact that chronic stress has on their health. They will also be able to  apply different coping mechanisms, such as reframing negative self-talk. Patients will have the opportunity to practice a variety of stress management techniques, such as deep abdominal breathing, progressive muscle relaxation, and/or guided imagery.  The purpose of this lesson is to educate  patients on the role of stress in their lives and to provide healthy techniques for coping with it.  Learning Barriers/Preferences:  Learning Barriers/Preferences - 05/28/22 1249       Learning Barriers/Preferences   Learning Barriers Sight   wears glasses   Learning Preferences Skilled Demonstration;Individual Instruction             Education Topics:  Knowledge Questionnaire Score:  Knowledge Questionnaire Score - 05/28/22 1248       Knowledge Questionnaire Score   Pre Score 21/24             Core Components/Risk Factors/Patient Goals at Admission:  Personal Goals and Risk Factors at Admission - 05/28/22 1248       Core Components/Risk Factors/Patient Goals on Admission    Weight Management Yes;Obesity    Intervention Weight Management: Develop a combined nutrition and exercise program designed to reach desired caloric intake, while maintaining appropriate intake of nutrient and fiber, sodium and fats, and appropriate energy expenditure required for the weight goal.;Weight Management: Provide education and appropriate resources to help participant work on and attain dietary goals.;Weight Management/Obesity: Establish reasonable short term and long term weight goals.    Expected Outcomes Short Term: Continue to assess and modify interventions until short term weight is achieved;Long Term: Adherence to nutrition and physical activity/exercise program aimed toward attainment of established weight goal;Understanding recommendations for meals to include 15-35% energy as protein, 25-35% energy from fat, 35-60% energy from carbohydrates, less than 200mg  of dietary cholesterol, 20-35 gm of total  fiber daily;Understanding of distribution of calorie intake throughout the day with the consumption of 4-5 meals/snacks    Heart Failure Yes    Intervention Provide a combined exercise and nutrition program that is supplemented with education, support and counseling about heart failure. Directed toward relieving symptoms such as shortness of breath, decreased exercise tolerance, and extremity edema.    Expected Outcomes Improve functional capacity of life;Short term: Attendance in program 2-3 days a week with increased exercise capacity. Reported lower sodium intake. Reported increased fruit and vegetable intake. Reports medication compliance.;Short term: Daily weights obtained and reported for increase. Utilizing diuretic protocols set by physician.    Hypertension Yes    Intervention Provide education on lifestyle modifcations including regular physical activity/exercise, weight management, moderate sodium restriction and increased consumption of fresh fruit, vegetables, and low fat dairy, alcohol moderation, and smoking cessation.;Monitor prescription use compliance.    Expected Outcomes Short Term: Continued assessment and intervention until BP is < 140/84mm HG in hypertensive participants. < 130/68mm HG in hypertensive participants with diabetes, heart failure or chronic kidney disease.;Long Term: Maintenance of blood pressure at goal levels.    Lipids Yes    Intervention Provide education and support for participant on nutrition & aerobic/resistive exercise along with prescribed medications to achieve LDL 70mg , HDL >40mg .    Expected Outcomes Short Term: Participant states understanding of desired cholesterol values and is compliant with medications prescribed. Participant is following exercise prescription and nutrition guidelines.;Long Term: Cholesterol controlled with medications as prescribed, with individualized exercise RX and with personalized nutrition plan. Value goals: LDL < 70mg , HDL > 40  mg.             Core Components/Risk Factors/Patient Goals Review:   Goals and Risk Factor Review     Row Name 06/06/22 1623 07/03/22 1412 07/25/22 1709         Core Components/Risk Factors/Patient Goals Review   Personal Goals Review Weight Management/Obesity;Heart Failure;Stress;Hypertension;Lipids Weight Management/Obesity;Heart Failure;Stress;Hypertension;Lipids Weight  Management/Obesity;Heart Failure;Stress;Hypertension;Lipids     Review Leslie [name] started intensive cardiac rehab on 06/03/22. Leslie [name] is off to a good start to exercise. Vital signs have been stable Leslie [name] is doing well with exercise at  intensive cardiac rehab . Vital signs have been stable. Leslie [name] has lost 1.5 kg since starting the program Leslie [name] is doing well with exercise at  intensive cardiac rehab . Vital signs have been stable. Leslie [name] has lost 2.5 kg since starting the program. Leslie [name] will complete intensive cardiac rehab on 08/09/22     Expected Outcomes Leslie [name] will continue to participate in intensive cardiac rehab for exercise, nutrition and lifestyle modifications Leslie [name] will continue to participate in intensive cardiac rehab for exercise, nutrition and lifestyle modifications Leslie [name] will continue to participate in intensive cardiac rehab for exercise, nutrition and lifestyle modifications              Core Components/Risk Factors/Patient Goals at Discharge (Final Review):   Goals and Risk Factor Review - 07/25/22 1709       Core Components/Risk Factors/Patient Goals Review   Personal Goals Review Weight Management/Obesity;Heart Failure;Stress;Hypertension;Lipids    Review Leslie [name] is doing well with exercise at  intensive cardiac rehab . Vital signs have been stable. Leslie [name] has lost 2.5 kg since starting the program. Leslie [name] will complete intensive cardiac rehab on 08/09/22    Expected Outcomes Leslie [name] will continue to participate in intensive cardiac rehab for exercise, nutrition and lifestyle modifications              ITP Comments:  ITP Comments     Row Name 05/28/22 1103 06/06/22 1603 07/03/22 1409 07/25/22 1706     ITP Comments Medical director - Armanda Magic, MD.  Introduction to the [facility]in Education Program / Intensive Cardiac Rehab.  Inital orientation packet reviewed with the patient. 30 Day ITP Review. Leslie [name] started intensive cardiac rehab on 06/03/22 and is off to a good start to exercise. 30 Day ITP Review. Leslie [name] has good attendance and participation in  intensive cardiac rehab 30 Day ITP Review. Leslie [name] has good attendance and participation in  intensive cardiac rehab. Leslie [name] will complete intensive cardiac rehab on 08/09/22             Comments: See ITP comments.

## 2022-07-26 ENCOUNTER — Encounter (HOSPITAL_COMMUNITY): Payer: Self-pay | Admitting: Physician Assistant

## 2022-07-26 ENCOUNTER — Encounter (HOSPITAL_COMMUNITY): Payer: PPO

## 2022-07-26 ENCOUNTER — Ambulatory Visit (HOSPITAL_COMMUNITY)
Admission: RE | Admit: 2022-07-26 | Discharge: 2022-07-26 | Disposition: A | Discharge status: Home / Self Care | Payer: PPO | Source: Ambulatory Visit | Attending: Physician Assistant | Admitting: Physician Assistant

## 2022-07-26 VITALS — BP 126/72 | HR 63 | Ht 66.0 in | Wt 255.8 lb

## 2022-07-26 DIAGNOSIS — I4819 Other persistent atrial fibrillation: Secondary | ICD-10-CM | POA: Diagnosis not present

## 2022-07-26 DIAGNOSIS — Z79899 Other long term (current) drug therapy: Secondary | ICD-10-CM | POA: Diagnosis not present

## 2022-07-26 DIAGNOSIS — G4733 Obstructive sleep apnea (adult) (pediatric): Secondary | ICD-10-CM | POA: Insufficient documentation

## 2022-07-26 DIAGNOSIS — I428 Other cardiomyopathies: Secondary | ICD-10-CM | POA: Diagnosis not present

## 2022-07-26 DIAGNOSIS — D6869 Other thrombophilia: Secondary | ICD-10-CM | POA: Diagnosis not present

## 2022-07-26 DIAGNOSIS — I10 Essential (primary) hypertension: Secondary | ICD-10-CM | POA: Insufficient documentation

## 2022-07-26 DIAGNOSIS — E785 Hyperlipidemia, unspecified: Secondary | ICD-10-CM | POA: Insufficient documentation

## 2022-07-26 DIAGNOSIS — I080 Rheumatic disorders of both mitral and aortic valves: Secondary | ICD-10-CM | POA: Diagnosis not present

## 2022-07-26 DIAGNOSIS — Z7901 Long term (current) use of anticoagulants: Secondary | ICD-10-CM | POA: Diagnosis not present

## 2022-07-26 DIAGNOSIS — E039 Hypothyroidism, unspecified: Secondary | ICD-10-CM | POA: Insufficient documentation

## 2022-07-26 DIAGNOSIS — E669 Obesity, unspecified: Secondary | ICD-10-CM | POA: Insufficient documentation

## 2022-07-26 DIAGNOSIS — Z6841 Body Mass Index (BMI) 40.0 and over, adult: Secondary | ICD-10-CM | POA: Insufficient documentation

## 2022-07-26 DIAGNOSIS — Z5181 Encounter for therapeutic drug level monitoring: Secondary | ICD-10-CM | POA: Diagnosis not present

## 2022-07-26 LAB — TSH: TSH: 5.013 u[IU]/mL — ABNORMAL HIGH (ref 0.350–4.500)

## 2022-07-26 LAB — COMPREHENSIVE METABOLIC PANEL
ALT: 8 U/L (ref 0–44)
AST: 26 U/L (ref 15–41)
Albumin: 3.7 g/dL (ref 3.5–5.0)
Alkaline Phosphatase: 84 U/L (ref 38–126)
Anion gap: 10 (ref 5–15)
BUN: 25 mg/dL — ABNORMAL HIGH (ref 8–23)
CO2: 19 mmol/L — ABNORMAL LOW (ref 22–32)
Calcium: 8.4 mg/dL — ABNORMAL LOW (ref 8.9–10.3)
Chloride: 108 mmol/L (ref 98–111)
Creatinine, Ser: 1.34 mg/dL — ABNORMAL HIGH (ref 0.44–1.00)
GFR, Estimated: 41 mL/min — ABNORMAL LOW (ref >60)
Glucose, Bld: 112 mg/dL — ABNORMAL HIGH (ref 70–99)
Potassium: 4.5 mmol/L (ref 3.5–5.1)
Sodium: 137 mmol/L (ref 135–145)
Total Bilirubin: <0.1 mg/dL — ABNORMAL LOW (ref 0.3–1.2)
Total Protein: 6.6 g/dL (ref 6.5–8.1)

## 2022-07-26 NOTE — Progress Notes (Signed)
Primary Care Physician: Shon Hale, MD Primary Cardiologist: Dr Royann Shivers Primary Electrophysiologist: none Referring Physician: [facility]Care triage   Leslie [name] is a 77 y.o. female with a history of CKD, HLD, HTN, hypothyroidism, mild-mod MR, mod AI, OSA, and atrial fibrillation who presents for consultation in the [facility].  The patient was initially diagnosed with atrial fibrillation 07/2016 and had DCCV at that time. Beta-blocker dose was decreased in May 2018 when she had junctional rhythm.  After she received her second dose of coronavirus vaccine in February 2021 she developed symptomatic persistent atrial fibrillation with RVR, again challenging to rate control and underwent cardioversion on June 24, 2019. Patient is on Eliquis for a CHADS2VASC score of 3.   She went back  into the afib 04/29/20  with RVR. She called into the answering service and spoke to [place], [name]. He increased her dose of metoprolol to 100 mg in  the pm (usual dose  100 mg  in am and 50 mg in pm) Her v range at home is 90's to 120. She feels she went into afib with worries over a sick cat and the weather. She  does feel fatigue and shortness of breath with activity in afib. She is compliant with CPAP, minimal caffeine, no alcohol, no tobacco. No missed anticoagulation. Has had all vaccines.  F/u successful cardioversion, 05/09/20. unfortunately she is back in afib with RVR. Dicussed antiarrythmic's with pt and with Dr. Salena Saner. We discussed use of flecainide as she does not want to use amiodarone with her known thyroid disease. She  will need a stress test which Dr. Salena Saner will coordinate and then will get loaded on flecainide with a low risk test and try to get cardioverted in a timely manner. Pt is in agreement. No missed anticoagulation. No conduction issues on ekg's in SR. EF was normal on latest echo.   F/u in afib clinic, 12/07/20, for f/u from Dr. Salena Saner for flecainide surveillance.  She has been staying in SR on flecainide. She has a h/o of migraines and feel that they may have increased around the time of starting flecainide but cannot say for sure this is what is triggering them again. She was having some bradycardia buyt her drugs was tweaked by Dr. Salena Saner in May  and this has improved.   Follow up in the AF clinic 07/26/22.  She presented to the ED on 04/09/2022 with shortness of breath, hypertensive urgency with SBP >200.  Echocardiogram showed EF 25 to 30%, severely decreased LV function, RWMA, mild LVH, normal RV systolic function, small pericardial effusion, moderate mitral valve regurgitation, moderate aortic valve regurgitation.  Troponin was elevated.  Cardiology was consulted and she underwent R/LHC on 04/11/2022 which revealed mild pulmonary hypertension, mild nonobstructive CAD, and ICM. Flecainide and verapamil were transitioned to amiodarone and metoprolol in the setting of reduced EF.  It was thought that her acute systolic heart failure was secondary to Takotsubo/stress-induced cardiomyopathy/type II MI in the setting of hypertensive urgency.  She was discharged home in stable condition on 04/12/2022. Echo 05/22/22 showed her EF returned to normal. This morning, patient had sudden onset tachypalpitations, her heart rates were up to 140s bpm. She called the clinic to make an appointment and took her morning medications. She converted to SR en route. There were no specific triggers that she could identify.   Today, she denies symptoms of chest pain, shortness of breath/fatigue, no  orthopnea, PND, lower extremity edema, dizziness, presyncope,  syncope, snoring, daytime somnolence, bleeding, or neurologic sequela. The patient is tolerating medications without difficulties and is otherwise without complaint today.    Atrial Fibrillation Risk Factors:  she does have symptoms or diagnosis of sleep apnea. she is compliant with CPAP therapy. she does not have a history of rheumatic  fever. she does not have a history of alcohol use. The patient does not have a history of early familial atrial fibrillation or other arrhythmias.  she has a BMI of Body mass index is 41.29 kg/m.Marland Kitchen Filed Weights   07/26/22 1126  Weight: 116 kg    Family History  Problem Relation Age of Onset   Heart failure Mother    Hypertension Mother    Hypertension Father    Emphysema Father      Atrial Fibrillation Management history:  Previous antiarrhythmic drugs: flecainide, amiodarone  Previous cardioversions: 08/01/2016, 06/24/19 Previous ablations: none Anticoagulation history: Eliquis   Past Medical History:  Diagnosis Date   Adenomatous polyp    Arthritis    "knees, legs, fingers" (07/30/2016)   Asthma    Bursitis of left shoulder    "just finished PT" (07/30/2016)   Chest pain    a. 2003 Abnl stress test-->Cath: nonobs CAD.   CHF (congestive heart failure)    Chronic bronchitis    Chronic kidney disease    stage 3   GERD (gastroesophageal reflux disease)    barrett's esophagus- Dr. Matthias Hughs   History of hiatal hernia    Hyperlipidemia    Hypertension    Hypothyroidism    Migraines    "sporatic; at least a few/year" (07/30/2016)   Mitral regurgitation    a. 07/2016 Echo: mild to mod MR;  b. 07/2016 TEE: mild MR.   Moderate aortic insufficiency    a. 07/2016 Echo: EF 55-60%, mod AI;  b. 07/2016 TEE: EF 50-55%, mod AI.   Obesity    s/p lap band surgery 6/10   OSA on CPAP    PAF (paroxysmal atrial fibrillation)    a. 07/2016 TEE/DCCV: EF 50-55%, mild MR, mod AI, mild to mod TR, neg bubble study-->successful DCCV x1 w/ 120J.   PONV (postoperative nausea and vomiting)    Vitamin D deficiency    Past Surgical History:  Procedure Laterality Date   APPENDECTOMY     incidental   CARDIOVERSION N/A 08/01/2016   Procedure: CARDIOVERSION;  Surgeon: Jake Bathe, MD;  Location: [facility] ENDOSCOPY;  Service: Cardiovascular;  Laterality: N/A;   CARDIOVERSION N/A 06/24/2019   Procedure:  CARDIOVERSION;  Surgeon: Thurmon Fair, MD;  Location: [facility] ENDOSCOPY;  Service: Cardiovascular;  Laterality: N/A;   CARDIOVERSION N/A 05/30/2020   Procedure: CARDIOVERSION;  Surgeon: Thurmon Fair, MD;  Location: [facility] ENDOSCOPY;  Service: Cardiovascular;  Laterality: N/A;   CARDIOVERSION N/A 05/09/2020   Procedure: CARDIOVERSION;  Surgeon: Little Ishikawa, MD;  Location: [facility] ENDOSCOPY;  Service: Cardiovascular;  Laterality: N/A;   CATARACT EXTRACTION W/ INTRAOCULAR LENS  IMPLANT, BILATERAL Bilateral ~ 2008-2017   left - right   COMBINED HYSTERECTOMY VAGINAL / OOPHORECTOMY / A&P REPAIR  1981   HERNIA REPAIR     INGUINAL HERNIA REPAIR Left 1952   LAPAROSCOPIC GASTRIC BANDING WITH HIATAL HERNIA REPAIR  09/2008   RIGHT/LEFT HEART CATH AND CORONARY ANGIOGRAPHY N/A 04/11/2022   Procedure: RIGHT/LEFT HEART CATH AND CORONARY ANGIOGRAPHY;  Surgeon: Corky Crafts, MD;  Location: [facility] INVASIVE CV LAB;  Service: Cardiovascular;  Laterality: N/A;   TEE WITHOUT CARDIOVERSION N/A 08/01/2016   Procedure: TRANSESOPHAGEAL ECHOCARDIOGRAM (TEE);  Surgeon: Jake Bathe, MD;  Location: [facility] ENDOSCOPY;  Service: Cardiovascular;  Laterality: N/A;   TONSILLECTOMY AND ADENOIDECTOMY  1974   VAGINAL HYSTERECTOMY  1981   "w/1 ovary"    Current Outpatient Medications  Medication Sig Dispense Refill   acetaminophen (TYLENOL) 650 MG CR tablet Take 1,300 mg by mouth every 8 (eight) hours as needed for pain.     amiodarone (PACERONE) 200 MG tablet Take 1 tablet (200 mg total) by mouth daily. 90 tablet 3   apixaban (ELIQUIS) 5 MG TABS tablet Take 1 tablet (5 mg total) by mouth 2 (two) times daily. 180 tablet 3   ARNUITY ELLIPTA 50 MCG/ACT AEPB Inhale into the lungs.     atorvastatin (LIPITOR) 40 MG tablet Take 1 tablet (40 mg total) by mouth daily. 90 tablet 3   Bacillus Coagulans-Inulin (ALIGN PREBIOTIC-PROBIOTIC PO) Take 2 capsules by mouth daily. Gummy     cholecalciferol (VITAMIN D) 1000 UNITS tablet Take  1,000 Units by mouth daily.     diclofenac Sodium (VOLTAREN) 1 % GEL Apply 1 application topically 2 (two) times daily.     fexofenadine (ALLEGRA) 180 MG tablet Take 180 mg by mouth daily.     fluticasone (FLONASE) 50 MCG/ACT nasal spray Place 2 sprays into both nostrils at bedtime.      furosemide (LASIX) 40 MG tablet Take 1 tablet (40 mg total) by mouth as needed. For weight greater than 261 lbs 30 tablet 3   Glucosamine-Chondroitin (OSTEO BI-FLEX REGULAR STRENGTH PO) Take 1 tablet by mouth daily.     levothyroxine (SYNTHROID, LEVOTHROID) 50 MCG tablet Take 50 mcg by mouth daily before breakfast.     magnesium oxide (MAG-OX) 400 (240 Mg) MG tablet Take 1 tablet (400 mg total) by mouth daily. 90 tablet 3   metoprolol succinate (TOPROL-XL) 100 MG 24 hr tablet Take 100 mg (one tablet) in the morning and 50 mg (half a tablet) in the evening. 135 tablet 3   metroNIDAZOLE (METROCREAM) 0.75 % cream Apply 1 application  topically daily as needed.     mometasone (ELOCON) 0.1 % cream Apply 1 application topically 2 (two) times daily as needed (skin irritation).     Multiple Vitamins-Minerals (CENTRUM MULTIGUMMIES) CHEW Chew 1 tablet by mouth daily.     omeprazole (PRILOSEC) 20 MG capsule Take 40 mg by mouth daily before breakfast.     sacubitril-valsartan (ENTRESTO) 49-51 MG Take 1 tablet by mouth 2 (two) times daily. 60 tablet 11   senna (SENOKOT) 8.6 MG tablet Take 1 tablet by mouth every evening.     No current facility-administered medications for this encounter.    Allergies  Allergen Reactions   Clindamycin Hcl     Lip and face swelling   Augmentin [Amoxicillin-Pot Clavulanate] Diarrhea    Did it involve swelling of the face/tongue/throat, SOB, or low BP? No Did it involve sudden or severe rash/hives, skin peeling, or any reaction on the inside of your mouth or nose? No Did you need to seek medical attention at a hospital or doctor's office? No When did it last happen?      10 + years If  all above answers are "NO", may proceed with cephalosporin use.    Codeine Nausea Only and Other (See Comments)    Hyperactivity   Demerol [Meperidine] Nausea And Vomiting   Sulfa Antibiotics Rash    Social History   Socioeconomic History   Marital status: Divorced    Spouse name: Not on file  Number of children: Not on file   Years of education: 16   Highest education level: Not on file  Occupational History   Occupation: Retired  Tobacco Use   Smoking status: Former   Smokeless tobacco: Never   Tobacco comments:    07/30/2016 "social smoker only; in college"  Vaping Use   Vaping Use: Never used  Substance and Sexual Activity   Alcohol use: Yes    Alcohol/week: 2.0 standard drinks of alcohol    Types: 1 Glasses of wine, 1 Standard drinks or equivalent per week    Comment: 07/30/2016 "couple drinks/month"   Drug use: No   Sexual activity: Not Currently  Other Topics Concern   Not on file  Social History Narrative   Not on file   Social Determinants of Health   Financial Resource Strain: Medium Risk (04/12/2022)   Overall Financial Resource Strain (CARDIA)    Difficulty of Paying Living Expenses: Somewhat hard  Food Insecurity: No Food Insecurity (04/09/2022)   Hunger Vital Sign    Worried About Running Out of Food in the Last Year: Never true    Ran Out of Food in the Last Year: Never true  Transportation Needs: No Transportation Needs (04/09/2022)   PRAPARE - Administrator, Civil Service (Medical): No    Lack of Transportation (Non-Medical): No  Physical Activity: Not on file  Stress: Not on file  Social Connections: Not on file  Intimate Partner Violence: Not At Risk (04/09/2022)   Humiliation, Afraid, Rape, and Kick questionnaire    Fear of Current or Ex-Partner: No    Emotionally Abused: No    Physically Abused: No    Sexually Abused: No     ROS- All systems are reviewed and negative except as per the HPI above.  Physical Exam: Vitals:    07/26/22 1126  BP: 126/72  Pulse: 63  Weight: 116 kg  Height:  (1.676 m)     GEN- The patient is a well appearing female, alert and oriented x 3 today.   HEENT-head normocephalic, atraumatic, sclera clear, conjunctiva pink, hearing intact, trachea midline. Lungs- Clear to ausculation bilaterally, normal work of breathing Heart- Regular rate and rhythm, no murmurs, rubs or gallops  GI- soft, NT, ND, + BS Extremities- no clubbing, cyanosis, or edema MS- no significant deformity or atrophy Skin- no rash or lesion Psych- euthymic mood, full affect Neuro- strength and sensation are intact   Wt Readings from Last 3 Encounters:  07/26/22 116 kg  05/30/22 118.1 kg  05/28/22 118.5 kg    EKG today demonstrates  SR Vent. rate 63 BPM PR interval 156 ms QRS duration 78 ms QT/QTcB 416/425 ms  Echo 05/22/22 demonstrated   1. Left ventricular ejection fraction, by estimation, is 65 to 70%. The  left ventricle has normal function. The left ventricle has no regional  wall motion abnormalities. Left ventricular diastolic parameters are  consistent with Grade II diastolic dysfunction (pseudonormalization). Elevated left ventricular end-diastolic pressure.   2. Right ventricular systolic function is normal. The right ventricular  size is normal. There is normal pulmonary artery systolic pressure.   3. Left atrial size was severely dilated.   4. The mitral valve is normal in structure. Moderate mitral valve  regurgitation. No evidence of mitral stenosis.   5. The aortic valve is tricuspid. Aortic valve regurgitation is moderate.  No aortic stenosis is present. Aortic regurgitation PHT measures 653 msec.   6. The inferior vena cava is normal in  size with greater than 50%  respiratory variability, suggesting right atrial pressure of 3 mmHg.   Epic records are reviewed at length today  CHA2DS2-VASc Score = 5  The patient's score is based upon: CHF History: 1 HTN History: 1 Diabetes  History: 0 Stroke History: 0 Vascular Disease History: 0 Age Score: 2 Gender Score: 1    ASSESSMENT AND PLAN: 1. Persistent Atrial Fibrillation (ICD10:  I48.19) The patient's CHA2DS2-VASc score is 5, indicating a 7.2% annual risk of stroke.   Patient back in SR after taking her morning medications. Will continue amiodarone 200 mg daily. We did discuss long term rhythm control options to avoid long term off target effects of amiodarone including flecainide and dofetilide. If she continues to lose weight, she may also be a candidate for ablation.  Check cmet/tsh today.  Continue Eliquis 5 mg BID Continue Toprol 100 mg AM and 50 mg PM  2. Secondary Hypercoagulable State (ICD10:  D68.69) The patient is at significant risk for stroke/thromboembolism based upon her CHA2DS2-VASc Score of 5.  Continue Apixaban (Eliquis).   3. Obesity Body mass index is 41.29 kg/m. S/p lab band 2010 Lifestyle modification was discussed and encouraged including regular physical activity and weight reduction.   4. Obstructive sleep apnea Encouraged compliance with CPAP therapy.  5. HTN Stable, no changes today.  6. NICM Supsected stress induced CM secondary to hypertensive emergency.  LHC did not show obstructive disease. EF normalized on echo 05/22/22.  GDMT per primary cardiology team.   Follow up with Dr Royann Shivers as scheduled.    Jorja Loa PA-C Afib Clinic [facility] [address] [place], [place] [address] [phone]

## 2022-07-29 ENCOUNTER — Encounter (HOSPITAL_COMMUNITY)
Admission: RE | Admit: 2022-07-29 | Discharge: 2022-07-29 | Disposition: A | Discharge status: Home / Self Care | Payer: PPO | Source: Ambulatory Visit | Attending: Cardiovascular Disease | Admitting: Cardiovascular Disease

## 2022-07-29 ENCOUNTER — Encounter (HOSPITAL_COMMUNITY): Payer: PPO

## 2022-07-29 DIAGNOSIS — I5022 Chronic systolic (congestive) heart failure: Secondary | ICD-10-CM | POA: Diagnosis not present

## 2022-07-30 ENCOUNTER — Ambulatory Visit: Payer: PPO | Attending: Cardiovascular Disease | Admitting: Cardiovascular Disease

## 2022-07-30 ENCOUNTER — Encounter: Payer: Self-pay | Admitting: Cardiovascular Disease

## 2022-07-30 VITALS — BP 104/64 | HR 60 | Ht 66.0 in | Wt 253.6 lb

## 2022-07-30 DIAGNOSIS — I351 Nonrheumatic aortic (valve) insufficiency: Secondary | ICD-10-CM | POA: Diagnosis not present

## 2022-07-30 DIAGNOSIS — Z5181 Encounter for therapeutic drug level monitoring: Secondary | ICD-10-CM

## 2022-07-30 DIAGNOSIS — G4733 Obstructive sleep apnea (adult) (pediatric): Secondary | ICD-10-CM

## 2022-07-30 DIAGNOSIS — I10 Essential (primary) hypertension: Secondary | ICD-10-CM

## 2022-07-30 DIAGNOSIS — R0600 Dyspnea, unspecified: Secondary | ICD-10-CM

## 2022-07-30 DIAGNOSIS — I340 Nonrheumatic mitral (valve) insufficiency: Secondary | ICD-10-CM | POA: Diagnosis not present

## 2022-07-30 DIAGNOSIS — I4819 Other persistent atrial fibrillation: Secondary | ICD-10-CM | POA: Diagnosis not present

## 2022-07-30 DIAGNOSIS — Z79899 Other long term (current) drug therapy: Secondary | ICD-10-CM

## 2022-07-30 DIAGNOSIS — I5032 Chronic diastolic (congestive) heart failure: Secondary | ICD-10-CM

## 2022-07-30 DIAGNOSIS — D6869 Other thrombophilia: Secondary | ICD-10-CM

## 2022-07-30 MED ORDER — METOPROLOL SUCCINATE ER 100 MG PO TB24: ORAL_TABLET | ORAL | 3 refills | Status: DC

## 2022-07-30 NOTE — Patient Instructions (Signed)
Medication Instructions:  Take Metoprolol 50mg  in the morning and 100mg  in the evening *If you need a refill on your cardiac medications before your next appointment, please call your pharmacy*  Testing/Procedures: Dr Royann Shivers has ordered pulmonary function tests    Follow-Up: At [facility], you and your health needs are our priority.  As part of our continuing mission to provide you with exceptional heart care, we have created designated Provider Care Teams.  These Care Teams include your primary Cardiologist (physician) and Advanced Practice Providers (APPs -  Physician Assistants and Nurse Practitioners) who all work together to provide you with the care you need, when you need it.  We recommend signing up for the patient portal called "MyChart".  Sign up information is provided on this After Visit Summary.  MyChart is used to connect with patients for Virtual Visits (Telemedicine).  Patients are able to view lab/test results, encounter notes, upcoming appointments, etc.  Non-urgent messages can be sent to your provider as well.   To learn more about what you can do with MyChart, go to [email or web address].    Your next appointment:   6 month(s)  Provider:   Thurmon Fair, MD

## 2022-07-30 NOTE — Progress Notes (Signed)
Cardiology Office Note:    Date:  07/30/2022   ID:  Leslie, [name] 06-26-1945, MRN [id number]  PCP:  Shon Hale, MD  Cardiologist:  Thurmon Fair, MD    Referring MD: Shon Hale, *   Chief Complaint  Patient presents with   Atrial Fibrillation     History of Present Illness:    Leslie [name] is a 77 y.o. female with a hx of Persistent atrial fibrillation and diastolic heart failure returning for follow-up.  Additional medical problems include OSA, HTN, prediabetes, HLP, CKD 3A, moderate MR, moderate AI, morbid obesity.  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  Cardiac catheterization showed normal coronary arteries.  Flecainide and verapamil were discontinued and she was switched to amiodarone and metoprolol, as well as Entresto.  Follow-up echocardiogram 05/22/2022 showed complete normalization of left ventricular systolic function with EF 65 to 70%.  She has been on amiodarone now for about 3-4 months.  She had her first breakthrough episode of atrial fibrillation since starting amiodarone just last Friday, 07/26/2022, but the arrhythmia resolved spontaneously.  She was very disappointed about the breakthrough event, but please that the arrhythmia terminated spontaneously, for the first time.  She is aware of every beat that follows out of rhythm, has palpitations frequently but very briefly.  Metoprolol continues to do a good job of preventing her migraines.  She reports compliance with CPAP and denies daytime hypersomnolence.  She does not have orthopnea or PND.  She has a little bit of swelling at all times in her left ankle only.  She really enjoys cardiac rehab and believes that her stamina is improving steadily.  She is also lost about 15 pounds of weight since participating in rehab.  Occasionally her blood pressure is low when she goes to rehab and on 1 occasion she was actually sent home because  her blood pressure was too low.  Rarely has brief dizziness, has not experienced syncope.  She has not had any falls or serious bleeding problems on Eliquis anticoagulation.  Most recent hemoglobin was 10.5, normocytic, borderline hypochromic.  Metabolic control is fair her most recent hemoglobin A1c was mildly elevated at 5.9%.  Her most recent LDL cholesterol was 104 on atorvastatin (she did not have coronary plaque and has no evidence of PAD detected to date).  Her most recent creatinine was 1.34 (baseline seems to be around 1.1-1.2 with a peak of 1.64 in the last few years).  TSH performed 07/26/2022 was nominally elevated at 5.013 but she does not have any symptoms of hypothyroidism.  She is on chronic levothyroxine supplementation at 50 mcg daily.  Initially presented with persistent atrial fibrillation in April 2018, had a difficult time with rate control and underwent TEE guided cardioversion.  Beta-blocker dose was decreased in May 2018 when she had junctional rhythm.  After she received her second dose of coronavirus vaccine in February 2021 she developed symptomatic persistent atrial fibrillation with RVR, again challenging to rate control and underwent cardioversion on June 24, 2019.  She then had to undergo cardioversion twice in 2022: First in January, with early recurrence of atrial fibrillation after which flecainide was started;  second cardioversion in February after which she developed pulmonary edema and required brief hospitalization. In December 2023 she presented with acute systolic heart failure with EF 25-30% and severe hypertension.  Probable Takotsubo syndrome.  Flecainide and verapamil were discontinued but she continued on metoprolol and added  Entresto and amiodarone.  Follow-up echo in February 2024 showed complete normalization of LV function. A nuclear stress test on May 26, 2020 did not show any evidence of ischemia.   Echocardiogram on June 01, 2020 showed LVEF  55-60% with "pseudo normal" mitral inflow and no major valvular abnormalities (mild mitral regurgitation and moderate aortic regurgitation, unchanged from the previous one performed in March 2021.  The left atrium is moderately dilated.  LVEF is normal.  "Pseudonormal" mitral inflow was described, but this was performed relatively soon after her cardioversion0  She has iron deficiency anemia and is receiving iron infusions.  She was intolerant of oral iron due to severe constipation.  Additional problems include morbid obesity, obstructive sleep apnea on CPAP and treated hypertension.     Past Medical History:  Diagnosis Date   Adenomatous polyp    Arthritis    "knees, legs, fingers" (07/30/2016)   Asthma    Bursitis of left shoulder    "just finished PT" (07/30/2016)   Chest pain    a. 2003 Abnl stress test-->Cath: nonobs CAD.   CHF (congestive heart failure)    Chronic bronchitis    Chronic kidney disease    stage 3   GERD (gastroesophageal reflux disease)    barrett's esophagus- Dr. Matthias Hughs   History of hiatal hernia    Hyperlipidemia    Hypertension    Hypothyroidism    Migraines    "sporatic; at least a few/year" (07/30/2016)   Mitral regurgitation    a. 07/2016 Echo: mild to mod MR;  b. 07/2016 TEE: mild MR.   Moderate aortic insufficiency    a. 07/2016 Echo: EF 55-60%, mod AI;  b. 07/2016 TEE: EF 50-55%, mod AI.   Obesity    s/p lap band surgery 6/10   OSA on CPAP    PAF (paroxysmal atrial fibrillation)    a. 07/2016 TEE/DCCV: EF 50-55%, mild MR, mod AI, mild to mod TR, neg bubble study-->successful DCCV x1 w/ 120J.   PONV (postoperative nausea and vomiting)    Vitamin D deficiency     Past Surgical History:  Procedure Laterality Date   APPENDECTOMY     incidental   CARDIOVERSION N/A 08/01/2016   Procedure: CARDIOVERSION;  Surgeon: Jake Bathe, MD;  Location: [facility] ENDOSCOPY;  Service: Cardiovascular;  Laterality: N/A;   CARDIOVERSION N/A 06/24/2019   Procedure:  CARDIOVERSION;  Surgeon: Thurmon Fair, MD;  Location: [facility] ENDOSCOPY;  Service: Cardiovascular;  Laterality: N/A;   CARDIOVERSION N/A 05/30/2020   Procedure: CARDIOVERSION;  Surgeon: Thurmon Fair, MD;  Location: [facility] ENDOSCOPY;  Service: Cardiovascular;  Laterality: N/A;   CARDIOVERSION N/A 05/09/2020   Procedure: CARDIOVERSION;  Surgeon: Little Ishikawa, MD;  Location: [facility] ENDOSCOPY;  Service: Cardiovascular;  Laterality: N/A;   CATARACT EXTRACTION W/ INTRAOCULAR LENS  IMPLANT, BILATERAL Bilateral ~ 2008-2017   left - right   COMBINED HYSTERECTOMY VAGINAL / OOPHORECTOMY / A&P REPAIR  1981   HERNIA REPAIR     INGUINAL HERNIA REPAIR Left 1952   LAPAROSCOPIC GASTRIC BANDING WITH HIATAL HERNIA REPAIR  09/2008   RIGHT/LEFT HEART CATH AND CORONARY ANGIOGRAPHY N/A 04/11/2022   Procedure: RIGHT/LEFT HEART CATH AND CORONARY ANGIOGRAPHY;  Surgeon: Corky Crafts, MD;  Location: [facility] INVASIVE CV LAB;  Service: Cardiovascular;  Laterality: N/A;   TEE WITHOUT CARDIOVERSION N/A 08/01/2016   Procedure: TRANSESOPHAGEAL ECHOCARDIOGRAM (TEE);  Surgeon: Jake Bathe, MD;  Location: [facility] ENDOSCOPY;  Service: Cardiovascular;  Laterality: N/A;   TONSILLECTOMY AND ADENOIDECTOMY  1974  VAGINAL HYSTERECTOMY  1981   "w/1 ovary"    Current Medications: Current Meds  Medication Sig   acetaminophen (TYLENOL) 650 MG CR tablet Take 1,300 mg by mouth every 8 (eight) hours as needed for pain.   amiodarone (PACERONE) 200 MG tablet Take 1 tablet (200 mg total) by mouth daily.   apixaban (ELIQUIS) 5 MG TABS tablet Take 1 tablet (5 mg total) by mouth 2 (two) times daily.   ARNUITY ELLIPTA 50 MCG/ACT AEPB Inhale into the lungs.   atorvastatin (LIPITOR) 40 MG tablet Take 1 tablet (40 mg total) by mouth daily.   Bacillus Coagulans-Inulin (ALIGN PREBIOTIC-PROBIOTIC PO) Take 2 capsules by mouth daily. Gummy   cholecalciferol (VITAMIN D) 1000 UNITS tablet Take 1,000 Units by mouth daily.   diclofenac Sodium (VOLTAREN)  1 % GEL Apply 1 application topically 2 (two) times daily.   fexofenadine (ALLEGRA) 180 MG tablet Take 180 mg by mouth daily.   fluticasone (FLONASE) 50 MCG/ACT nasal spray Place 2 sprays into both nostrils at bedtime.    furosemide (LASIX) 40 MG tablet Take 1 tablet (40 mg total) by mouth as needed. For weight greater than 261 lbs   Glucosamine-Chondroitin (OSTEO BI-FLEX REGULAR STRENGTH PO) Take 1 tablet by mouth daily.   levothyroxine (SYNTHROID, LEVOTHROID) 50 MCG tablet Take 50 mcg by mouth daily before breakfast.   magnesium oxide (MAG-OX) 400 (240 Mg) MG tablet Take 1 tablet (400 mg total) by mouth daily.   metroNIDAZOLE (METROCREAM) 0.75 % cream Apply 1 application  topically daily as needed.   Multiple Vitamins-Minerals (CENTRUM MULTIGUMMIES) CHEW Chew 1 tablet by mouth daily.   omeprazole (PRILOSEC) 20 MG capsule Take 40 mg by mouth daily before breakfast.   sacubitril-valsartan (ENTRESTO) 49-51 MG Take 1 tablet by mouth 2 (two) times daily.   senna (SENOKOT) 8.6 MG tablet Take 1 tablet by mouth every evening.   [DISCONTINUED] metoprolol succinate (TOPROL-XL) 100 MG 24 hr tablet Take 100 mg (one tablet) in the morning and 50 mg (half a tablet) in the evening.     Allergies:   Clindamycin hcl, Augmentin [amoxicillin-pot clavulanate], Codeine, Demerol [meperidine], and Sulfa antibiotics   Social History   Socioeconomic History   Marital status: Divorced    Spouse name: Not on file   Number of children: Not on file   Years of education: 16   Highest education level: Not on file  Occupational History   Occupation: Retired  Tobacco Use   Smoking status: Former   Smokeless tobacco: Never   Tobacco comments:    07/30/2016 "social smoker only; in college"  Vaping Use   Vaping Use: Never used  Substance and Sexual Activity   Alcohol use: Yes    Alcohol/week: 2.0 standard drinks of alcohol    Types: 1 Glasses of wine, 1 Standard drinks or equivalent per week    Comment: 07/30/2016  "couple drinks/month"   Drug use: No   Sexual activity: Not Currently  Other Topics Concern   Not on file  Social History Narrative   Not on file   Social Determinants of Health   Financial Resource Strain: Medium Risk (04/12/2022)   Overall Financial Resource Strain (CARDIA)    Difficulty of Paying Living Expenses: Somewhat hard  Food Insecurity: No Food Insecurity (04/09/2022)   Hunger Vital Sign    Worried About Running Out of Food in the Last Year: Never true    Ran Out of Food in the Last Year: Never true  Transportation Needs: No Transportation Needs (  04/09/2022)   PRAPARE - Administrator, Civil Service (Medical): No    Lack of Transportation (Non-Medical): No  Physical Activity: Not on file  Stress: Not on file  Social Connections: Not on file     Family History: The patient's family history includes Emphysema in her father; Heart failure in her mother; Hypertension in her father and mother. ROS:   Please see the history of present illness.   All other systems are reviewed and are negative.   EKGs/Labs/Other Studies Reviewed:     EKG: Ordered today and personally reviewed shows normal sinus rhythm, poor R wave progression in the anterior leads, QTc 428 ms.  Recent Labs: 09/04/2021 Hemoglobin A1c 5.7% Hemoglobin 13.0, creatinine 1.1, potassium 4.4, ALT 13, TSH 2.61  07/26/2022 Creatinine 1.34, potassium 4.5, ALT 8, TSH 5.0.  BMET    Component Value Date/Time   NA 137 07/26/2022 1156   NA 137 06/07/2020 1045   K 4.5 07/26/2022 1156   CL 108 07/26/2022 1156   CO2 19 (L) 07/26/2022 1156   GLUCOSE 112 (H) 07/26/2022 1156   BUN 25 (H) 07/26/2022 1156   BUN 24 06/07/2020 1045   CREATININE 1.34 (H) 07/26/2022 1156   CREATININE 1.16 (H) 09/06/2020 0754   CREATININE 1.49 (H) 08/13/2016 1556   CALCIUM 8.4 (L) 07/26/2022 1156   GFRNONAA 41 (L) 07/26/2022 1156   GFRNONAA 49 (L) 09/06/2020 0754   GFRNONAA 35 (L) 08/13/2016 1556   GFRAA 42 (L)  06/07/2020 1045   GFRAA 41 (L) 08/13/2016 1556     Recent Lipid Panel 09/04/2021 Cholesterol 179, HDL 40, LDL 104, triglycerides 200      Component Value Date/Time   CHOL 146 07/31/2016 0435   TRIG 174 (H) 07/31/2016 0435   HDL 32 (L) 07/31/2016 0435   CHOLHDL 4.6 07/31/2016 0435   VLDL 35 07/31/2016 0435   LDLCALC 79 07/31/2016 0435    Physical Exam:    VS:  BP 104/64 (BP Location: Left Arm, Patient Position: Sitting, Cuff Size: Large)   Pulse 60   Ht 5\' 6"  (1.676 m)   Wt 253 lb 9.6 oz (115 kg)   SpO2 94%   BMI 40.93 kg/m     Wt Readings from Last 3 Encounters:  07/30/22 253 lb 9.6 oz (115 kg)  07/26/22 255 lb 12.8 oz (116 kg)  05/30/22 260 lb 6.4 oz (118.1 kg)      General: Alert, oriented x3, no distress, morbidly obese Head: no evidence of trauma, PERRL, EOMI, no exophtalmos or lid lag, no myxedema, no xanthelasma; normal ears, nose and oropharynx Neck: normal jugular venous pulsations and no hepatojugular reflux; brisk carotid pulses without delay and no carotid bruits Chest: clear to auscultation, no signs of consolidation by percussion or palpation, normal fremitus, symmetrical and full respiratory excursions Cardiovascular: normal position and quality of the apical impulse, regular rhythm, normal first and second heart sounds, no murmurs, rubs or gallops Abdomen: no tenderness or distention, no masses by palpation, no abnormal pulsatility or arterial bruits, normal bowel sounds, no hepatosplenomegaly Extremities: no clubbing, cyanosis or edema; 2+ radial, ulnar and brachial pulses bilaterally; 2+ right femoral, posterior tibial and dorsalis pedis pulses; 2+ left femoral, posterior tibial and dorsalis pedis pulses; no subclavian or femoral bruits Neurological: grossly nonfocal Psych: Normal mood and affect    ASSESSMENT:    1. Persistent atrial fibrillation   2. Encounter for monitoring amiodarone therapy   3. Acquired thrombophilia   4. Essential  hypertension, benign  5. Chronic diastolic heart failure   6. OSA (obstructive sleep apnea)   7. Morbid obesity   8. Nonrheumatic mitral valve regurgitation   9. Nonrheumatic aortic valve insufficiency   10. Dyspnea, unspecified type      PLAN:    In order of problems listed above:  AFib: Maintaining normal sinus rhythm on amiodarone.  During treatment with flecainide she had an episode of severe acute heart failure, possibly Takotsubo cardiomyopathy.  Echo continues to show evidence of diastolic dysfunction.  Not a great candidate for ablation due to morbid obesity.  Had a recent breakthrough event on amiodarone.  I think the best choice is to continue amiodarone despite its potential side effects.  Overall excellent response to flecainide plus beta-blocker plus verapamil . CHADSVasc 5-6 (age3, gender, CHF, HTN, +/- preDM).  Amiodarone: We discussed the multiple possible side effects.  I would recommend a repeat TSH and free T4 in about a month, before we change her levothyroxine dose.  I am sure that changes in TSH are amiodarone related.  Had normal liver function tests just last week.  Needs to have a yearly ophthalmological exam.  Avoid prolonged sun exposure.  Discussed multiple possible drug interactions with amiodarone.  Talked about the possibility of rare but dangerous lung side effects.  She has noticed maybe some slight shortness of breath recently.  Will get some pulmonary function test now to serve as a baseline for future changes. Anticoagulation: Well-tolerated without bleeding problems. HTN: Blood pressure is borderline low.  Switch the metoprolol to take the higher dose in the evening and the lower dose in the morning. CHF: Currently euvolemic and NYHA functional class I-II.  Has had complete resolution of her systolic dysfunction.  Remains on Entresto, metoprolol succinate.  Takes loop diuretic only as needed, fairly infrequently, most recently 2 days ago, if her weight is over  260 pounds.  Clinically euvolemic, NYHA functional class I-2.  Even before her episode of Takotsubo cardiomyopathy she had 1 episode of heart failure with preserved ejection fraction that occurred immediately after cardioversion.  There is some underlying cardiomyopathy. OSA: Compliant with CPAP, denies hypersomnolence Obesity: Has lost a little weight.  Aware of the direct relationship between weight and burden of atrial fibrillation. MR/AR: Both moderate MR and moderate AR have been largely unchanged on repeat echoes over the last 4-5 years Migraines: These do better on on the higher dose of metoprolol.  Verapamil stopped due to low EF, could be restarted if necessary in the future.  Medication Adjustments/Labs and Tests Ordered: Current medicines are reviewed at length with the patient today.  Concerns regarding medicines are outlined above.  Orders Placed This Encounter  Procedures   EKG 12-Lead   Pulmonary function test    Meds ordered this encounter  Medications   metoprolol succinate (TOPROL-XL) 100 MG 24 hr tablet    Sig: Take 50mg  in the morning and 100mg  in the evening    Dispense:  135 tablet    Refill:  3    Dose increase. Patient will contact the pharmacy when a refill is needed.    Patient Instructions  Medication Instructions:  Take Metoprolol 50mg  in the morning and 100mg  in the evening *If you need a refill on your cardiac medications before your next appointment, please call your pharmacy*  Testing/Procedures: Dr Royann Shivers has ordered pulmonary function tests    Follow-Up: At [facility], you and your health needs are our priority.  As part of our continuing mission to  provide you with exceptional heart care, we have created designated Provider Care Teams.  These Care Teams include your primary Cardiologist (physician) and Advanced Practice Providers (APPs -  Physician Assistants and Nurse Practitioners) who all work together to provide you with the care  you need, when you need it.  We recommend signing up for the patient portal called "MyChart".  Sign up information is provided on this After Visit Summary.  MyChart is used to connect with patients for Virtual Visits (Telemedicine).  Patients are able to view lab/test results, encounter notes, upcoming appointments, etc.  Non-urgent messages can be sent to your provider as well.   To learn more about what you can do with MyChart, go to [email or web address].    Your next appointment:   6 month(s)  Provider:   Thurmon Fair, MD       Signed, Thurmon Fair, MD  07/30/2022 12:48 PM    [facility]

## 2022-07-31 ENCOUNTER — Encounter (HOSPITAL_COMMUNITY)
Admission: RE | Admit: 2022-07-31 | Discharge: 2022-07-31 | Disposition: A | Discharge status: Home / Self Care | Payer: PPO | Source: Ambulatory Visit | Attending: Cardiovascular Disease | Admitting: Cardiovascular Disease

## 2022-07-31 DIAGNOSIS — I5022 Chronic systolic (congestive) heart failure: Secondary | ICD-10-CM

## 2022-08-02 ENCOUNTER — Encounter (HOSPITAL_COMMUNITY)
Admission: RE | Admit: 2022-08-02 | Discharge: 2022-08-02 | Disposition: A | Discharge status: Home / Self Care | Payer: PPO | Source: Ambulatory Visit | Attending: Cardiovascular Disease | Admitting: Cardiovascular Disease

## 2022-08-02 DIAGNOSIS — I5022 Chronic systolic (congestive) heart failure: Secondary | ICD-10-CM | POA: Diagnosis not present

## 2022-08-05 ENCOUNTER — Encounter (HOSPITAL_COMMUNITY)
Admission: RE | Admit: 2022-08-05 | Discharge: 2022-08-05 | Disposition: A | Discharge status: Home / Self Care | Payer: PPO | Source: Ambulatory Visit | Attending: Cardiovascular Disease | Admitting: Cardiovascular Disease

## 2022-08-05 DIAGNOSIS — I5022 Chronic systolic (congestive) heart failure: Secondary | ICD-10-CM

## 2022-08-07 ENCOUNTER — Encounter (HOSPITAL_COMMUNITY)
Admission: RE | Admit: 2022-08-07 | Discharge: 2022-08-07 | Disposition: A | Discharge status: Home / Self Care | Payer: PPO | Source: Ambulatory Visit | Attending: Cardiovascular Disease | Admitting: Cardiovascular Disease

## 2022-08-07 DIAGNOSIS — I5022 Chronic systolic (congestive) heart failure: Secondary | ICD-10-CM

## 2022-08-07 NOTE — Progress Notes (Signed)
Reviewed home exercise Rx with patient today.  Encouraged warm-up, cool-down, and stretching. Reviewed THRR of 58 - 115 and keeping RPE between 11-13. Encouraged to hydrate with activity.  Reviewed weather parameters for temperature and humidity for safe exercise outdoors. Reviewed S/S to terminate exercise and when to call 911 vs MD. Pt encouraged to always carry a cell phone for safety when exercising outdoors. Pt verbalized understanding of the home exercise Rx and was provided a copy.   Whitley Patchen MS, ACSM-CEP, CCRP  

## 2022-08-09 ENCOUNTER — Encounter (HOSPITAL_COMMUNITY)
Admission: RE | Admit: 2022-08-09 | Discharge: 2022-08-09 | Disposition: A | Discharge status: Home / Self Care | Payer: PPO | Source: Ambulatory Visit | Attending: Cardiovascular Disease | Admitting: Cardiovascular Disease

## 2022-08-09 DIAGNOSIS — I5022 Chronic systolic (congestive) heart failure: Secondary | ICD-10-CM | POA: Diagnosis not present

## 2022-08-12 ENCOUNTER — Encounter (HOSPITAL_COMMUNITY)
Admission: RE | Admit: 2022-08-12 | Discharge: 2022-08-12 | Disposition: A | Discharge status: Home / Self Care | Payer: PPO | Source: Ambulatory Visit | Attending: Cardiovascular Disease | Admitting: Cardiovascular Disease

## 2022-08-12 DIAGNOSIS — I5022 Chronic systolic (congestive) heart failure: Secondary | ICD-10-CM | POA: Diagnosis not present

## 2022-08-14 ENCOUNTER — Encounter (HOSPITAL_COMMUNITY)
Admission: RE | Admit: 2022-08-14 | Discharge: 2022-08-14 | Disposition: A | Discharge status: Home / Self Care | Payer: PPO | Source: Ambulatory Visit | Attending: Cardiovascular Disease | Admitting: Cardiovascular Disease

## 2022-08-14 DIAGNOSIS — I5022 Chronic systolic (congestive) heart failure: Secondary | ICD-10-CM | POA: Diagnosis not present

## 2022-08-16 ENCOUNTER — Encounter (HOSPITAL_COMMUNITY)
Admission: RE | Admit: 2022-08-16 | Discharge: 2022-08-16 | Disposition: A | Discharge status: Home / Self Care | Payer: PPO | Source: Ambulatory Visit | Attending: Cardiovascular Disease | Admitting: Cardiovascular Disease

## 2022-08-16 DIAGNOSIS — I5022 Chronic systolic (congestive) heart failure: Secondary | ICD-10-CM | POA: Diagnosis not present

## 2022-08-19 ENCOUNTER — Encounter (HOSPITAL_COMMUNITY)
Admission: RE | Admit: 2022-08-19 | Discharge: 2022-08-19 | Disposition: A | Discharge status: Home / Self Care | Payer: PPO | Source: Ambulatory Visit | Attending: Cardiovascular Disease | Admitting: Cardiovascular Disease

## 2022-08-19 DIAGNOSIS — I5022 Chronic systolic (congestive) heart failure: Secondary | ICD-10-CM | POA: Diagnosis not present

## 2022-08-20 NOTE — Progress Notes (Signed)
Cardiac Individual Treatment Plan  Patient Details  Name: Leslie [name] MRN: [id number] Date of Birth: 05-Dec-1945 Referring Provider:   Flowsheet Row INTENSIVE CARDIAC REHAB ORIENT from 05/28/2022 in [facility] for [facility], Vascular, & [facility]  Referring Provider Thurmon Fair, MD       Initial Encounter Date:  Flowsheet Row INTENSIVE CARDIAC REHAB ORIENT from 05/28/2022 in [facility] for Heart, Vascular, & Lung Health  Date 05/28/22       Visit Diagnosis: Heart failure, chronic systolic (HCC)  Patient's Home Medications on Admission:  Current Outpatient Medications:    acetaminophen (TYLENOL) 650 MG CR tablet, Take 1,300 mg by mouth every 8 (eight) hours as needed for pain., Disp: , Rfl:    amiodarone (PACERONE) 200 MG tablet, Take 1 tablet (200 mg total) by mouth daily., Disp: 90 tablet, Rfl: 3   apixaban (ELIQUIS) 5 MG TABS tablet, Take 1 tablet (5 mg total) by mouth 2 (two) times daily., Disp: 180 tablet, Rfl: 3   ARNUITY ELLIPTA 50 MCG/ACT AEPB, Inhale into the lungs., Disp: , Rfl:    atorvastatin (LIPITOR) 40 MG tablet, Take 1 tablet (40 mg total) by mouth daily., Disp: 90 tablet, Rfl: 3   Bacillus Coagulans-Inulin (ALIGN PREBIOTIC-PROBIOTIC PO), Take 2 capsules by mouth daily. Gummy, Disp: , Rfl:    cholecalciferol (VITAMIN D) 1000 UNITS tablet, Take 1,000 Units by mouth daily., Disp: , Rfl:    diclofenac Sodium (VOLTAREN) 1 % GEL, Apply 1 application topically 2 (two) times daily., Disp: , Rfl:    fexofenadine (ALLEGRA) 180 MG tablet, Take 180 mg by mouth daily., Disp: , Rfl:    fluticasone (FLONASE) 50 MCG/ACT nasal spray, Place 2 sprays into both nostrils at bedtime. , Disp: , Rfl:    furosemide (LASIX) 40 MG tablet, Take 1 tablet (40 mg total) by mouth as needed. For weight greater than 261 lbs, Disp: 30 tablet, Rfl: 3   Glucosamine-Chondroitin (OSTEO BI-FLEX REGULAR STRENGTH PO), Take 1 tablet by mouth daily., Disp: ,  Rfl:    levothyroxine (SYNTHROID, LEVOTHROID) 50 MCG tablet, Take 50 mcg by mouth daily before breakfast., Disp: , Rfl:    magnesium oxide (MAG-OX) 400 (240 Mg) MG tablet, Take 1 tablet (400 mg total) by mouth daily., Disp: 90 tablet, Rfl: 3   metoprolol succinate (TOPROL-XL) 100 MG 24 hr tablet, Take 50mg  in the morning and 100mg  in the evening, Disp: 135 tablet, Rfl: 3   metroNIDAZOLE (METROCREAM) 0.75 % cream, Apply 1 application  topically daily as needed., Disp: , Rfl:    mometasone (ELOCON) 0.1 % cream, Apply 1 application topically 2 (two) times daily as needed (skin irritation). (Patient not taking: Reported on 07/30/2022), Disp: , Rfl:    Multiple Vitamins-Minerals (CENTRUM MULTIGUMMIES) CHEW, Chew 1 tablet by mouth daily., Disp: , Rfl:    omeprazole (PRILOSEC) 20 MG capsule, Take 40 mg by mouth daily before breakfast., Disp: , Rfl:    sacubitril-valsartan (ENTRESTO) 49-51 MG, Take 1 tablet by mouth 2 (two) times daily., Disp: 60 tablet, Rfl: 11   senna (SENOKOT) 8.6 MG tablet, Take 1 tablet by mouth every evening., Disp: , Rfl:   Past Medical History: Past Medical History:  Diagnosis Date   Adenomatous polyp    Arthritis    "knees, legs, fingers" (07/30/2016)   Asthma    Bursitis of left shoulder    "just finished PT" (07/30/2016)   Chest pain    a. 2003 Abnl stress test-->Cath: nonobs CAD.  CHF (congestive heart failure) (HCC)    Chronic bronchitis (HCC)    Chronic kidney disease    stage 3   GERD (gastroesophageal reflux disease)    barrett's esophagus- Dr. Matthias Hughs   History of hiatal hernia    Hyperlipidemia    Hypertension    Hypothyroidism    Migraines    "sporatic; at least a few/year" (07/30/2016)   Mitral regurgitation    a. 07/2016 Echo: mild to mod MR;  b. 07/2016 TEE: mild MR.   Moderate aortic insufficiency    a. 07/2016 Echo: EF 55-60%, mod AI;  b. 07/2016 TEE: EF 50-55%, mod AI.   Obesity    s/p lap band surgery 6/10   OSA on CPAP    PAF (paroxysmal atrial  fibrillation) (HCC)    a. 07/2016 TEE/DCCV: EF 50-55%, mild MR, mod AI, mild to mod TR, neg bubble study-->successful DCCV x1 w/ 120J.   PONV (postoperative nausea and vomiting)    Vitamin D deficiency     Tobacco Use: Social History   Tobacco Use  Smoking Status Former  Smokeless Tobacco Never  Tobacco Comments   07/30/2016 "social smoker only; in college"    Labs: Review Flowsheet  More data exists      Latest Ref Rng & Units 07/31/2016 06/24/2019 05/09/2020 09/09/2020 04/11/2022  Labs for ITP Cardiac and Pulmonary Rehab  Cholestrol 0 - 200 mg/dL 409  - - - -  LDL (calc) 0 - 99 mg/dL 79  - - - -  HDL-C >81 mg/dL 32  - - - -  Trlycerides <150 mg/dL 191  - - - -  Hemoglobin A1c 4.8 - 5.6 % 5.6  - - - -  PH, Arterial 7.35 - 7.45 - - - - 7.412   PCO2 arterial 32 - 48 mmHg - - - - 39.0   Bicarbonate 20.0 - 28.0 mmol/L 20.0 - 28.0 mmol/L - - - - 26.5  27.4  24.9   TCO2 22 - 32 mmol/L 22 - 32 mmol/L - 23  22  19  28  29  26    O2 Saturation % % - - - - 62  62  92     Capillary Blood Glucose: Lab Results  Component Value Date   GLUCAP 135 (H) 09/09/2020     Exercise Target Goals: Exercise Program Goal: Individual exercise prescription set using results from initial 6 min walk test and THRR while considering  patient's activity barriers and safety.   Exercise Prescription Goal: Initial exercise prescription builds to 30-45 minutes a day of aerobic activity, 2-3 days per week.  Home exercise guidelines will be given to patient during program as part of exercise prescription that the participant will acknowledge.  Activity Barriers & Risk Stratification:  Activity Barriers & Cardiac Risk Stratification - 05/28/22 1259       Activity Barriers & Cardiac Risk Stratification   Activity Barriers Arthritis;Neck/Spine Problems;Joint Problems;Deconditioning;Muscular Weakness;Decreased Ventricular Function;Balance Concerns    Cardiac Risk Stratification High             6  Minute Walk:  6 Minute Walk     Row Name 05/28/22 1256 08/21/22 1359       6 Minute Walk   Phase Initial  Nustep used Discharge  Nustep used    Distance 1574 feet  Nustep used 2329 feet    Distance % Change -- 47.97 %    Distance Feet Change -- 755 ft    Walk Time 6 minutes 6  minutes  Nustep    # of Rest Breaks 0 0    MPH 3 4.4    METS 2.2 3.2    RPE 11 13    Perceived Dyspnea  0 0    VO2 Peak 7.1 11.29    Symptoms Yes (comment) Yes (comment)    Comments Rt shoulder apin 6/10; right knee pain 6/10; left knee pain 5/10; left shoulder pain 4/10; left foot pain 5/10 BL leg fatigue    Resting HR 68 bpm 63 bpm    Resting BP 110/64 98/70    Resting Oxygen Saturation  98 % --    Exercise Oxygen Saturation  during 6 min walk 97 % --    Max Ex. HR 100 bpm 82 bpm    Max Ex. BP 114/74 110/56    2 Minute Post BP 104/70 --             Oxygen Initial Assessment:   Oxygen Re-Evaluation:   Oxygen Discharge (Final Oxygen Re-Evaluation):   Initial Exercise Prescription:  Initial Exercise Prescription - 05/28/22 1300       Date of Initial Exercise RX and Referring Provider   Date 05/28/22    Referring Provider Thurmon Fair, MD    Expected Discharge Date 08/09/22      T5 Nustep   Level 1    SPM 75    Minutes 25    METs 2      Prescription Details   Frequency (times per week) 3    Duration Progress to 30 minutes of continuous aerobic without signs/symptoms of physical distress      Intensity   THRR 40-80% of Max Heartrate 58 - 115    Ratings of Perceived Exertion 11-13    Perceived Dyspnea 0-4      Progression   Progression Continue progressive overload as per policy without signs/symptoms or physical distress.      Resistance Training   Training Prescription Yes    Weight 2 lbs    Reps 10-15             Perform Capillary Blood Glucose checks as needed.  Exercise Prescription Changes:   Exercise Prescription Changes     Row Name 06/03/22 1500  06/19/22 1400 06/28/22 1518 07/17/22 1500 07/31/22 1500     Response to Exercise   Blood Pressure (Admit) 132/70 116/64 116/70 112/76 102/60   Blood Pressure (Exercise) 156/70 120/66 128/80 142/58 118/60   Blood Pressure (Exit) 128/64 112/60 100/54 98/64 98/62    Heart Rate (Admit) 73 bpm 80 bpm 62 bpm 69 bpm 70 bpm   Heart Rate (Exercise) 101 bpm 121 bpm 85 bpm 98 bpm 107 bpm   Heart Rate (Exit) 79 bpm 69 bpm 71 bpm 64 bpm 63 bpm   Rating of Perceived Exertion (Exercise) 12 11 11 12 11    Symptoms chronic bilateral knee pain (3/10) on Nustep None None Left knee pain None   Comments Pt's first day in the CRP2 program Reviewed METs Reviewed MET and goals Reviewed METs Reviewed METs and goals   Duration Progress to 30 minutes of  aerobic without signs/symptoms of physical distress Continue with 30 min of aerobic exercise without signs/symptoms of physical distress. Continue with 30 min of aerobic exercise without signs/symptoms of physical distress. Progress to 30 minutes of  aerobic without signs/symptoms of physical distress Progress to 30 minutes of  aerobic without signs/symptoms of physical distress   Intensity THRR unchanged THRR unchanged THRR unchanged THRR unchanged THRR unchanged  Progression   Progression Continue to progress workloads to maintain intensity without signs/symptoms of physical distress. Continue to progress workloads to maintain intensity without signs/symptoms of physical distress. Continue to progress workloads to maintain intensity without signs/symptoms of physical distress. Continue to progress workloads to maintain intensity without signs/symptoms of physical distress. Continue to progress workloads to maintain intensity without signs/symptoms of physical distress.   Average METs 2.1 2.6 2.6 2.6 2.7     Resistance Training   Training Prescription Yes No Yes No No   Weight 2 lbs No weights on wednesdays 2 lbs No weights on Wednesdays No weights on Wednesdays   Reps  10-15 -- 10-15 -- --   Time 10 Minutes -- 10 Minutes -- --     Interval Training   Interval Training No No No No No     T5 Nustep   Level 1 1 1 2 2    SPM 88 107 110 112 119   Minutes 25 28 30 20 28    METs 2.1 2.6 2.6 2.6 2.7    Row Name 08/07/22 1400             Response to Exercise   Blood Pressure (Admit) 122/56       Blood Pressure (Exercise) 122/78       Blood Pressure (Exit) 106/60       Heart Rate (Admit) 61 bpm       Heart Rate (Exercise) 97 bpm       Heart Rate (Exit) 64 bpm       Rating of Perceived Exertion (Exercise) 13       Symptoms None       Comments Reviewed Home exercise Rx       Duration Continue with 30 min of aerobic exercise without signs/symptoms of physical distress.       Intensity THRR unchanged         Progression   Progression Continue to progress workloads to maintain intensity without signs/symptoms of physical distress.       Average METs 2.7         Resistance Training   Training Prescription No       Weight No weights on Wednesdays         Interval Training   Interval Training No         T5 Nustep   Level 2       SPM 116       Minutes 30       METs 2.7         Home Exercise Plan   Plans to continue exercise at Home (comment)       Frequency Add 2 additional days to program exercise sessions.       Initial Home Exercises Provided 08/07/22                Exercise Comments:   Exercise Comments     Row Name 06/03/22 1550 06/19/22 1440 07/02/22 1521 07/17/22 1500 08/07/22 1425   Exercise Comments Pt's first day in the CRP2 program. Pt had no complaints other than chronic bilateral knee pain Reviewed METs with patient today. Pt is progressing duration and instensity. Reviewed METs and goals. Will attempt to increase workload on Nustep next week. Reviewed METs with patient. Pt only conpleted 20 minutes due to knee issue. Hopefully pt will be able to resume 30 minutes nexet exercise session. Reviewed home exercie Rx with patient  today. Pt does yoga from the bed daily. Gave  patient chair exercise routines she can do at home: goal 2x/week for 30 minutes. Pt's knees prevent walking at home as a mode of exercise. Pt verbalized understanding of the home exercise Rx and was provided a copy.            Exercise Goals and Review:   Exercise Goals     Row Name 05/28/22 1259             Exercise Goals   Increase Physical Activity Yes       Intervention Provide advice, education, support and counseling about physical activity/exercise needs.;Develop an individualized exercise prescription for aerobic and resistive training based on initial evaluation findings, risk stratification, comorbidities and participant's personal goals.       Expected Outcomes Short Term: Attend rehab on a regular basis to increase amount of physical activity.;Long Term: Add in home exercise to make exercise part of routine and to increase amount of physical activity.;Long Term: Exercising regularly at least 3-5 days a week.       Increase Strength and Stamina Yes       Intervention Provide advice, education, support and counseling about physical activity/exercise needs.;Develop an individualized exercise prescription for aerobic and resistive training based on initial evaluation findings, risk stratification, comorbidities and participant's personal goals.       Expected Outcomes Short Term: Increase workloads from initial exercise prescription for resistance, speed, and METs.;Short Term: Perform resistance training exercises routinely during rehab and add in resistance training at home;Long Term: Improve cardiorespiratory fitness, muscular endurance and strength as measured by increased METs and functional capacity ( )       Able to understand and use rate of perceived exertion (RPE) scale Yes       Intervention Provide education and explanation on how to use RPE scale       Expected Outcomes Short Term: Able to use RPE daily in rehab to express  subjective intensity level;Long Term:  Able to use RPE to guide intensity level when exercising independently       Knowledge and understanding of Target Heart Rate Range (THRR) Yes       Intervention Provide education and explanation of THRR including how the numbers were predicted and where they are located for reference       Expected Outcomes Short Term: Able to state/look up THRR;Short Term: Able to use daily as guideline for intensity in rehab;Long Term: Able to use THRR to govern intensity when exercising independently       Understanding of Exercise Prescription Yes       Intervention Provide education, explanation, and written materials on patient's individual exercise prescription       Expected Outcomes Short Term: Able to explain program exercise prescription;Long Term: Able to explain home exercise prescription to exercise independently                Exercise Goals Re-Evaluation :  Exercise Goals Re-Evaluation     Row Name 06/03/22 1548 06/28/22 1520           Exercise Goal Re-Evaluation   Exercise Goals Review Increase Physical Activity;Increase Strength and Stamina;Able to understand and use rate of perceived exertion (RPE) scale;Knowledge and understanding of Target Heart Rate Range (THRR);Understanding of Exercise Prescription Increase Physical Activity;Increase Strength and Stamina;Able to understand and use rate of perceived exertion (RPE) scale;Knowledge and understanding of Target Heart Rate Range (THRR);Understanding of Exercise Prescription      Comments Pt's first day in the CRP2 program. Pt understands the  exericse Rx, THRR, and RPE. Reviewed METs and goals. Pt voices she has improved stamina since beginning the CRP2 program.      Expected Outcomes Will continue to monitor patient and progress exercise workloads as tolerated. Will continue to monitor patient and progress exercise workloads as tolerated.               Discharge Exercise Prescription (Final  Exercise Prescription Changes):  Exercise Prescription Changes - 08/07/22 1400       Response to Exercise   Blood Pressure (Admit) 122/56    Blood Pressure (Exercise) 122/78    Blood Pressure (Exit) 106/60    Heart Rate (Admit) 61 bpm    Heart Rate (Exercise) 97 bpm    Heart Rate (Exit) 64 bpm    Rating of Perceived Exertion (Exercise) 13    Symptoms None    Comments Reviewed Home exercise Rx    Duration Continue with 30 min of aerobic exercise without signs/symptoms of physical distress.    Intensity THRR unchanged      Progression   Progression Continue to progress workloads to maintain intensity without signs/symptoms of physical distress.    Average METs 2.7      Resistance Training   Training Prescription No    Weight No weights on Wednesdays      Interval Training   Interval Training No      T5 Nustep   Level 2    SPM 116    Minutes 30    METs 2.7      Home Exercise Plan   Plans to continue exercise at Home (comment)    Frequency Add 2 additional days to program exercise sessions.    Initial Home Exercises Provided 08/07/22             Nutrition:  Target Goals: Understanding of nutrition guidelines, daily intake of sodium 1500mg , cholesterol 200mg , calories 30% from fat and 7% or less from saturated fats, daily to have 5 or more servings of fruits and vegetables.  Biometrics:  Pre Biometrics - 05/28/22 1035       Pre Biometrics   Waist Circumference 48 inches    Hip Circumference 55.5 inches    Waist to Hip Ratio 0.86 %    Triceps Skinfold 46 mm    % Body Fat 55.2 %    Grip Strength 20 kg    Flexibility 11.25 in    Single Leg Stand 1.85 seconds             Post Biometrics - 08/21/22 1415        Post  Biometrics   Height 5\' 5"  (1.651 m)    Weight 115 kg   -3.5lbs   Waist Circumference 46 inches   -2in   Hip Circumference 55 inches   -0.5in   Waist to Hip Ratio 0.84 %    BMI (Calculated) 42.19    Triceps Skinfold 48 mm   +56mm   %  Body Fat 54 %   -2.17   Grip Strength 20 kg    Flexibility 11.25 in    Single Leg Stand 3.4 seconds             Nutrition Therapy Plan and Nutrition Goals:  Nutrition Therapy & Goals - 08/02/22 0829       Nutrition Therapy   Diet Heart healthy Diet    Drug/Food Interactions Statins/Certain Fruits      Personal Nutrition Goals   Nutrition Goal Patient to identify strategies for  reducing cardiovascular risk by attending the weekly [facility] education and nutrition series    Personal Goal #2 Patient to improve diet quality by using the plate method as a daily guide for meal planning to include lean protein/plant protein, fruits, vegetables, whole grains, nonfat dairy as part of well balanced diet    Personal Goal #3 Patient to limit sodium to 1500mg  per day    Comments Goals in action. [name] reports increased intake of fruits, vegetables, whole grains. She continues to attend the [facility] education and nutrition series regularly. She is down 5.5# since starting with our program. [name] will benefit from partication in intensive cardiac rehab for nutrition, exercise, and lifestyle modification.      Intervention Plan   Intervention Prescribe, educate and counsel regarding individualized specific dietary modifications aiming towards targeted core components such as weight, hypertension, lipid management, diabetes, heart failure and other comorbidities.;Nutrition handout(s) given to patient.    Expected Outcomes Short Term Goal: Understand basic principles of dietary content, such as calories, fat, sodium, cholesterol and nutrients.;Long Term Goal: Adherence to prescribed nutrition plan.             Nutrition Assessments:  Nutrition Assessments - 06/03/22 1434       Rate Your Plate Scores   Pre Score 64            MEDIFICTS Score Key: ?70 Need to make dietary changes  40-70 Heart Healthy Diet ? 40 Therapeutic Level Cholesterol Diet   Flowsheet Row INTENSIVE CARDIAC REHAB  from 06/03/2022 in [facility] for Heart, Vascular, & Lung Health  Picture Your Plate Total Score on Admission 64      Picture Your Plate Scores: <69 Unhealthy dietary pattern with much room for improvement. 41-50 Dietary pattern unlikely to meet recommendations for good health and room for improvement. 51-60 More healthful dietary pattern, with some room for improvement.  >60 Healthy dietary pattern, although there may be some specific behaviors that could be improved.    Nutrition Goals Re-Evaluation:  Nutrition Goals Re-Evaluation     Row Name 06/03/22 1432 07/01/22 0934 08/02/22 0829         Goals   Current Weight 262 lb 2 oz (118.9 kg) 260 lb 2.3 oz (118 kg) 255 lb 11.7 oz (116 kg)     Comment GFR 51, Cr 1.13, A1c 5.9, triglycerides 200, LDL 104 GFR 51, Cr 1.13, LipoproteinA 155 Cr 1.34, GFR 41, TSH 5.0, LipoproteinA 155     Expected Outcome [name] will benefit from partication in intensive cardiac rehab for nutrition, exercise, and lifestyle modification. Goals in action. [name] reports increased intake of fruits, vegetables, whole grains. She continues to attend the [facility] education and nutrition series regularly. She has maintained her weight since starting with our program. We discussed some substitutions for reducing added sugar (example coffee creamer, etc). [name] will benefit from partication in intensive cardiac rehab for nutrition, exercise, and lifestyle modification. Goals in action. [name] reports increased intake of fruits, vegetables, whole grains. She continues to attend the [facility] education and nutrition series regularly. She is down 5.5# since starting with our program. [name] will benefit from partication in intensive cardiac rehab for nutrition, exercise, and lifestyle modification.              Nutrition Goals Re-Evaluation:  Nutrition Goals Re-Evaluation     Row Name 06/03/22 1432 07/01/22 0934 08/02/22 0829         Goals    Current Weight 262 lb 2  oz (118.9 kg) 260 lb 2.3 oz (118 kg) 255 lb 11.7 oz (116 kg)     Comment GFR 51, Cr 1.13, A1c 5.9, triglycerides 200, LDL 104 GFR 51, Cr 1.13, LipoproteinA 155 Cr 1.34, GFR 41, TSH 5.0, LipoproteinA 155     Expected Outcome [name] will benefit from partication in intensive cardiac rehab for nutrition, exercise, and lifestyle modification. Goals in action. [name] reports increased intake of fruits, vegetables, whole grains. She continues to attend the [facility] education and nutrition series regularly. She has maintained her weight since starting with our program. We discussed some substitutions for reducing added sugar (example coffee creamer, etc). [name] will benefit from partication in intensive cardiac rehab for nutrition, exercise, and lifestyle modification. Goals in action. [name] reports increased intake of fruits, vegetables, whole grains. She continues to attend the [facility] education and nutrition series regularly. She is down 5.5# since starting with our program. [name] will benefit from partication in intensive cardiac rehab for nutrition, exercise, and lifestyle modification.              Nutrition Goals Discharge (Final Nutrition Goals Re-Evaluation):  Nutrition Goals Re-Evaluation - 08/02/22 0829       Goals   Current Weight 255 lb 11.7 oz (116 kg)    Comment Cr 1.34, GFR 41, TSH 5.0, LipoproteinA 155    Expected Outcome Goals in action. [name] reports increased intake of fruits, vegetables, whole grains. She continues to attend the [facility] education and nutrition series regularly. She is down 5.5# since starting with our program. [name] will benefit from partication in intensive cardiac rehab for nutrition, exercise, and lifestyle modification.             Psychosocial: Target Goals: Acknowledge presence or absence of significant depression and/or stress, maximize coping skills, provide positive support system. Participant is able to verbalize types and  ability to use techniques and skills needed for reducing stress and depression.  Initial Review & Psychosocial Screening:  Initial Psych Review & Screening - 05/28/22 1622       Initial Review   Current issues with Current Sleep Concerns;Current Anxiety/Panic      Family Dynamics   Good Support System? Yes   [name] lives alone she has friends who live in the area and a daughter for support who lives in [place]     Barriers   Psychosocial barriers to participate in program The patient should benefit from training in stress management and relaxation.      Screening Interventions   Interventions Encouraged to exercise             Quality of Life Scores:  Quality of Life - 08/19/22 1615       Quality of Life Scores   Health/Function Pre 21.39 %    Health/Function Post 24 %    Health/Function % Change 12.2 %    Socioeconomic Pre 30 %    Socioeconomic Post 30 %    Socioeconomic % Change  0 %    Psych/Spiritual Pre 23.79 %    Psych/Spiritual Post 26.36 %    Psych/Spiritual % Change 10.8 %    Family Pre 27 %    Family Post 28.5 %    Family % Change 5.56 %    GLOBAL Pre 24.32 %    GLOBAL Post 26.2 %    GLOBAL % Change 7.73 %            Scores of 19 and below usually indicate a poorer quality of  life in these areas.  A difference of  2-3 points is a clinically meaningful difference.  A difference of 2-3 points in the total score of the Quality of Life Index has been associated with significant improvement in overall quality of life, self-image, physical symptoms, and general health in studies assessing change in quality of life.  PHQ-9: Review Flowsheet       05/28/2022 02/22/2022  Depression screen PHQ 2/9  Decreased Interest 0 0  Down, Depressed, Hopeless 0 0  PHQ - 2 Score 0 0  Altered sleeping 2 -  Tired, decreased energy 1 -  Change in appetite 0 -  Feeling bad or failure about yourself  0 -  Trouble concentrating 0 -  Moving slowly or fidgety/restless 0 -   Suicidal thoughts 0 -  PHQ-9 Score 3 -  Difficult doing work/chores Not difficult at all -   Interpretation of Total Score  Total Score Depression Severity:  1-4 = Minimal depression, 5-9 = Mild depression, 10-14 = Moderate depression, 15-19 = Moderately severe depression, 20-27 = Severe depression   Psychosocial Evaluation and Intervention:   Psychosocial Re-Evaluation:  Psychosocial Re-Evaluation     Row Name 06/06/22 1606 07/03/22 1410 07/25/22 1707 08/20/22 1724       Psychosocial Re-Evaluation   Current issues with Current Stress Concerns;Current Anxiety/Panic Current Stress Concerns;Current Anxiety/Panic Current Stress Concerns;Current Anxiety/Panic Current Stress Concerns;Current Anxiety/Panic    Comments [name] started intensive cardiac rehab on 06/03/22 and did not voice any increased concerns or stressors on her first day of exercise.[name] admits to expereincing some anxiety due to her recent cardiac event/ diagnosis [name] has not voiced  any increased concerns or stressors at intensive cardiac rehab [name]  continues not to voice any increased concerns or stressors at intensive cardiac rehab [name]  continues not to voice any increased concerns or stressors at intensive cardiac rehab. [name] will complete intensive cardiac rehab on 08/23/22    Expected Outcomes [name] will have decreased or controlled anxiety upon completion of intensive cardiac rehab [name] will have decreased or controlled anxiety upon completion of intensive cardiac rehab [name] will have decreased or controlled anxiety upon completion of intensive cardiac rehab [name] will have decreased or controlled anxiety upon completion of intensive cardiac rehab    Interventions Stress management education;Encouraged to attend Cardiac Rehabilitation for the exercise;Relaxation education Stress management education;Encouraged to attend Cardiac Rehabilitation for the exercise;Relaxation education Stress management  education;Encouraged to attend Cardiac Rehabilitation for the exercise;Relaxation education Stress management education;Encouraged to attend Cardiac Rehabilitation for the exercise;Relaxation education    Continue Psychosocial Services  Follow up required by staff No Follow up required No Follow up required No Follow up required      Initial Review   Source of Stress Concerns Chronic Illness Chronic Illness Chronic Illness Chronic Illness    Comments Will continue to monitor and offer support as needed. Will continue to monitor and offer support as needed. Will continue to monitor and offer support as needed. Will continue to monitor and offer support as needed.             Psychosocial Discharge (Final Psychosocial Re-Evaluation):  Psychosocial Re-Evaluation - 08/20/22 1724       Psychosocial Re-Evaluation   Current issues with Current Stress Concerns;Current Anxiety/Panic    Comments [name]  continues not to voice any increased concerns or stressors at intensive cardiac rehab. [name] will complete intensive cardiac rehab on 08/23/22    Expected Outcomes [name] will have decreased or controlled anxiety  upon completion of intensive cardiac rehab    Interventions Stress management education;Encouraged to attend Cardiac Rehabilitation for the exercise;Relaxation education    Continue Psychosocial Services  No Follow up required      Initial Review   Source of Stress Concerns Chronic Illness    Comments Will continue to monitor and offer support as needed.             Vocational Rehabilitation: Provide vocational rehab assistance to qualifying candidates.   Vocational Rehab Evaluation & Intervention:  Vocational Rehab - 05/28/22 1624       Initial Vocational Rehab Evaluation & Intervention   Assessment shows need for Vocational Rehabilitation No   [name] is retired and does not need vocational rehab at this time            Education: Education Goals: Education classes will  be provided on a weekly basis, covering required topics. Participant will state understanding/return demonstration of topics presented.    Education     Row Name 06/03/22 1500     Education   Cardiac Education Topics Pritikin   Psychologist, forensic Exercise Education   Exercise Education Biomechanial Limitations   Instruction Review Code 1- Verbalizes Understanding   Class Start Time 1406   Class Stop Time 1442   Class Time Calculation (min) 36 min    Row Name 06/05/22 1600     Education   Cardiac Education Topics Pritikin   Customer service manager   Weekly Topic Fast Evening Meals   Instruction Review Code 1- Verbalizes Understanding   Class Start Time 1400   Class Stop Time 1445   Class Time Calculation (min) 45 min    Row Name 06/10/22 1500     Education   Cardiac Education Topics Pritikin   Customer service manager   Weekly Topic International Cuisine- Spotlight on the [employer] Zones   Instruction Review Code 1- Verbalizes Understanding   Class Start Time 1405   Class Stop Time 1453   Class Time Calculation (min) 48 min    Row Name 06/12/22 1500     Education   Cardiac Education Topics Pritikin   Licensed conveyancer Nutrition   Nutrition Facts on Fat   Instruction Review Code 1- Verbalizes Understanding   Class Start Time 1400   Class Stop Time 1450   Class Time Calculation (min) 50 min    Row Name 06/17/22 1600     Education   Cardiac Education Topics Pritikin   [employer] Workshops     Workshops   Educator Exercise Physiologist   Select Psychosocial   Psychosocial Workshop Other  From [employer] to Heart   Instruction Review Code 1- Verbalizes Understanding   Class Start Time 1400   Class Stop Time 1452   Class Time Calculation (min) 52 min    Row Name 06/21/22 1600      Education   Cardiac Education Topics Pritikin   Hospital doctor Education   General Education Heart Disease Risk Reduction   Instruction Review Code 1- Verbalizes Understanding   Class Start Time 1355   Class Stop Time 1440   Class Time Calculation (  min) 45 min    Row Name 06/24/22 1500     Education   Cardiac Education Topics Pritikin   Geographical information systems officer Exercise   Exercise Workshop Location manager and Fall Prevention   Instruction Review Code 1- Verbalizes Understanding   Class Start Time 1350   Class Stop Time 1432   Class Time Calculation (min) 42 min    Row Name 06/26/22 1500     Education   Cardiac Education Topics Pritikin   Customer service manager   Weekly Topic Fast and Healthy Breakfasts   Instruction Review Code 1- Verbalizes Understanding   Class Start Time 1355   Class Stop Time 1450   Class Time Calculation (min) 55 min    Row Name 06/28/22 1600     Education   Cardiac Education Topics Pritikin   Nurse, children's Dietitian   Nutrition Overview of the Pritikin Eating Plan   Instruction Review Code 1- Verbalizes Understanding   Class Start Time 1400   Class Stop Time 1436   Class Time Calculation (min) 36 min    Row Name 07/03/22 1600     Education   Cardiac Education Topics Pritikin   Customer service manager   Weekly Topic Personalizing Your Pritikin Plate   Instruction Review Code 1- Verbalizes Understanding   Class Start Time 1400   Class Stop Time 1448   Class Time Calculation (min) 48 min    Row Name 07/05/22 1600     Education   Cardiac Education Topics Pritikin   Nurse, children's Exercise Physiologist   [employer] Psychosocial   Psychosocial Healthy Minds, Bodies,  Hearts   Instruction Review Code 1- Verbalizes Understanding   Class Start Time 1405   Class Stop Time 1443   Class Time Calculation (min) 38 min    Row Name 07/10/22 1600     Education   Cardiac Education Topics Pritikin   Orthoptist   Educator Dietitian   Weekly Topic Delicious Desserts   Instruction Review Code 1- Verbalizes Understanding   Class Start Time 1400   Class Stop Time 1446   Class Time Calculation (min) 46 min    Row Name 07/12/22 0843     Education   Cardiac Education Topics Pritikin   Select Core Videos     Core Videos   Educator Dietitian   Select Nutrition   Nutrition Other   Instruction Review Code 1- Verbalizes Understanding   Class Start Time 1400   Class Stop Time 1446   Class Time Calculation (min) 46 min     Cooking School   Instruction Review Code 1- [employer] Understanding    Row Name 07/15/22 1500     Education   Cardiac Education Topics Pritikin   [employer] Workshops     Workshops   Educator Exercise Physiologist   Select Exercise   Exercise Workshop Exercise Basics: Building Your Action Plan   Instruction Review Code 1- Verbalizes Understanding   Class Start Time 1405   Class Stop Time 1452   Class Time Calculation (min) 47 min    Row Name 07/17/22 1500     Education   Cardiac Education Topics Pritikin  Special educational needs teacher and Snacks   Instruction Review Code 1- Verbalizes Understanding   Class Start Time 1400   Class Stop Time 1443   Class Time Calculation (min) 43 min    Row Name 07/19/22 1500     Education   Cardiac Education Topics Pritikin   Licensed conveyancer Nutrition   Nutrition Calorie Density   Instruction Review Code 1- Verbalizes Understanding   Class Start Time 1400   Class Stop Time 1440   Class Time Calculation (min) 40 min    Row Name 07/24/22  1600     Education   Cardiac Education Topics Pritikin   Customer service manager   Weekly Topic Efficiency Cooking - Meals in a Snap   Instruction Review Code 1- Verbalizes Understanding   Class Start Time 1400   Class Stop Time 1445   Class Time Calculation (min) 45 min    Row Name 07/29/22 1600     Education   Cardiac Education Topics Pritikin   Geographical information systems officer Psychosocial   Psychosocial Workshop Focused Goals, Sustainable Changes   Instruction Review Code 1- Verbalizes Understanding   Class Start Time 1400   Class Stop Time 1441   Class Time Calculation (min) 41 min    Row Name 07/31/22 1500     Education   Cardiac Education Topics Pritikin   Customer service manager   Weekly Topic One-Pot Wonders   Instruction Review Code 1- Verbalizes Understanding   Class Start Time 1400   Class Stop Time 1440   Class Time Calculation (min) 40 min    Row Name 08/07/22 1600     Education   Cardiac Education Topics Pritikin   Customer service manager   Weekly Topic Comforting Weekend Breakfasts   Instruction Review Code 1- Verbalizes Understanding   Class Start Time 1355   Class Stop Time 1442   Class Time Calculation (min) 47 min    Row Name 08/09/22 1500     Education   Cardiac Education Topics Pritikin   Licensed conveyancer Nutrition   Nutrition Dining Out - Part 1   Instruction Review Code 1- Verbalizes Understanding   Class Start Time 1400   Class Stop Time 1445   Class Time Calculation (min) 45 min    Row Name 08/19/22 1700     Education   Cardiac Education Topics Pritikin   [employer]     Workshops   Educator Exercise Physiologist   Select Psychosocial   Psychosocial Workshop Healthy Sleep for a Healthy Heart   Instruction  Review Code 1- Verbalizes Understanding   Class Start Time 1405   Class Stop Time 1502   Class Time Calculation (min) 57 min            Core Videos: Exercise    Move It!  Clinical staff conducted group or individual video education with verbal and written material and guidebook.  Patient learns the recommended Pritikin exercise program. Exercise with the goal of living a long, healthy life.  Some of the health benefits of exercise include controlled diabetes, healthier blood pressure levels, improved cholesterol levels, improved heart and lung capacity, improved sleep, and better body composition. Everyone should speak with their doctor before starting or changing an exercise routine.  Biomechanical Limitations Clinical staff conducted group or individual video education with verbal and written material and guidebook.  Patient learns how biomechanical limitations can impact exercise and how we can mitigate and possibly overcome limitations to have an impactful and balanced exercise routine.  Body Composition Clinical staff conducted group or individual video education with verbal and written material and guidebook.  Patient learns that body composition (ratio of muscle mass to fat mass) is a key component to assessing overall fitness, rather than body weight alone. Increased fat mass, especially visceral belly fat, can put [place] at increased risk for metabolic syndrome, type 2 diabetes, heart disease, and even death. It is recommended to combine diet and exercise (cardiovascular and resistance training) to improve your body composition. Seek guidance from your physician and exercise physiologist before implementing an exercise routine.  Exercise Action Plan Clinical staff conducted group or individual video education with verbal and written material and guidebook.  Patient learns the recommended strategies to achieve and enjoy long-term exercise adherence, including variety, self-motivation,  self-efficacy, and positive decision making. Benefits of exercise include fitness, good health, weight management, more energy, better sleep, less stress, and overall well-being.  Medical   Heart Disease Risk Reduction Clinical staff conducted group or individual video education with verbal and written material and guidebook.  Patient learns our heart is our most vital organ as it circulates oxygen, nutrients, white blood cells, and hormones throughout the entire body, and carries waste away. Data supports a plant-based eating plan like the Pritikin Program for its effectiveness in slowing progression of and reversing heart disease. The video provides a number of recommendations to address heart disease.   Metabolic Syndrome and Belly Fat  Clinical staff conducted group or individual video education with verbal and written material and guidebook.  Patient learns what metabolic syndrome is, how it leads to heart disease, and how one can reverse it and keep it from coming back. You have metabolic syndrome if you have 3 of the following 5 criteria: abdominal obesity, high blood pressure, high triglycerides, low HDL cholesterol, and high blood sugar.  Hypertension and Heart Disease Clinical staff conducted group or individual video education with verbal and written material and guidebook.  Patient learns that high blood pressure, or hypertension, is very common in the [place]. Hypertension is largely due to excessive salt intake, but other important risk factors include being overweight, physical inactivity, drinking too much alcohol, smoking, and not eating enough potassium from fruits and vegetables. High blood pressure is a leading risk factor for heart attack, stroke, congestive heart failure, dementia, kidney failure, and premature death. Long-term effects of excessive salt intake include stiffening of the arteries and thickening of heart muscle and organ damage. Recommendations include ways to  reduce hypertension and the risk of heart disease.  Diseases of Our Time - Focusing on Diabetes Clinical staff conducted group or individual video education with verbal and written material and guidebook.  Patient learns why the best way to stop diseases of our time is prevention, through food and other lifestyle changes. Medicine (such as prescription pills and surgeries) is often only a Band-Aid on the problem, not a long-term solution. Most common diseases of our time include obesity, type 2 diabetes, hypertension, heart disease, and  cancer. The Pritikin Program is recommended and has been proven to help reduce, reverse, and/or prevent the damaging effects of metabolic syndrome.  Nutrition   Overview of the Pritikin Eating Plan  Clinical staff conducted group or individual video education with verbal and written material and guidebook.  Patient learns about the Pritikin Eating Plan for disease risk reduction. The Pritikin Eating Plan emphasizes a wide variety of unrefined, minimally-processed carbohydrates, like fruits, vegetables, whole grains, and legumes. Go, Caution, and Stop food choices are explained. Plant-based and lean animal proteins are emphasized. Rationale provided for low sodium intake for blood pressure control, low added sugars for blood sugar stabilization, and low added fats and oils for coronary artery disease risk reduction and weight management.  Calorie Density  Clinical staff conducted group or individual video education with verbal and written material and guidebook.  Patient learns about calorie density and how it impacts the Pritikin Eating Plan. Knowing the characteristics of the food you choose will help you decide whether those foods will lead to weight gain or weight loss, and whether you want to consume more or less of them. Weight loss is usually a side effect of the Pritikin Eating Plan because of its focus on low calorie-dense foods.  Label Reading  Clinical staff  conducted group or individual video education with verbal and written material and guidebook.  Patient learns about the Pritikin recommended label reading guidelines and corresponding recommendations regarding calorie density, added sugars, sodium content, and whole grains.  Dining Out - Part 1  Clinical staff conducted group or individual video education with verbal and written material and guidebook.  Patient learns that restaurant meals can be sabotaging because they can be so high in calories, fat, sodium, and/or sugar. Patient learns recommended strategies on how to positively address this and avoid unhealthy pitfalls.  Facts on Fats  Clinical staff conducted group or individual video education with verbal and written material and guidebook.  Patient learns that lifestyle modifications can be just as effective, if not more so, as many medications for lowering your risk of heart disease. A Pritikin lifestyle can help to reduce your risk of inflammation and atherosclerosis (cholesterol build-up, or plaque, in the artery walls). Lifestyle interventions such as dietary choices and physical activity address the cause of atherosclerosis. A review of the types of fats and their impact on blood cholesterol levels, along with dietary recommendations to reduce fat intake is also included.  Nutrition Action Plan  Clinical staff conducted group or individual video education with verbal and written material and guidebook.  Patient learns how to incorporate Pritikin recommendations into their lifestyle. Recommendations include planning and keeping personal health goals in mind as an important part of their success.  Healthy Mind-Set    Healthy Minds, Bodies, Hearts  Clinical staff conducted group or individual video education with verbal and written material and guidebook.  Patient learns how to identify when they are stressed. Video will discuss the impact of that stress, as well as the many benefits of  stress management. Patient will also be introduced to stress management techniques. The way we think, act, and feel has an impact on our hearts.  How Our Thoughts Can Heal Our Hearts  Clinical staff conducted group or individual video education with verbal and written material and guidebook.  Patient learns that negative thoughts can cause depression and anxiety. This can result in negative lifestyle behavior and serious health problems. Cognitive behavioral therapy is an effective method to help control our thoughts  in order to change and improve our emotional outlook.  Additional Videos:  Exercise    Improving Performance  Clinical staff conducted group or individual video education with verbal and written material and guidebook.  Patient learns to use a non-linear approach by alternating intensity levels and lengths of time spent exercising to help burn more calories and lose more body fat. Cardiovascular exercise helps improve heart health, metabolism, hormonal balance, blood sugar control, and recovery from fatigue. Resistance training improves strength, endurance, balance, coordination, reaction time, metabolism, and muscle mass. Flexibility exercise improves circulation, posture, and balance. Seek guidance from your physician and exercise physiologist before implementing an exercise routine and learn your capabilities and proper form for all exercise.  Introduction to Yoga  Clinical staff conducted group or individual video education with verbal and written material and guidebook.  Patient learns about yoga, a discipline of the coming together of mind, breath, and body. The benefits of yoga include improved flexibility, improved range of motion, better posture and core strength, increased lung function, weight loss, and positive self-image. Yoga's heart health benefits include lowered blood pressure, healthier heart rate, decreased cholesterol and triglyceride levels, improved immune function,  and reduced stress. Seek guidance from your physician and exercise physiologist before implementing an exercise routine and learn your capabilities and proper form for all exercise.  Medical   Aging: Enhancing Your Quality of Life  Clinical staff conducted group or individual video education with verbal and written material and guidebook.  Patient learns key strategies and recommendations to stay in good physical health and enhance quality of life, such as prevention strategies, having an advocate, securing a Health Care Proxy and Power of Attorney, and keeping a list of medications and system for tracking them. It also discusses how to avoid risk for bone loss.  Biology of Weight Control  Clinical staff conducted group or individual video education with verbal and written material and guidebook.  Patient learns that weight gain occurs because we consume more calories than we burn (eating more, moving less). Even if your body weight is normal, you may have higher ratios of fat compared to muscle mass. Too much body fat puts you at increased risk for cardiovascular disease, heart attack, stroke, type 2 diabetes, and obesity-related cancers. In addition to exercise, following the Pritikin Eating Plan can help reduce your risk.  Decoding Lab Results  Clinical staff conducted group or individual video education with verbal and written material and guidebook.  Patient learns that lab test reflects one measurement whose values change over time and are influenced by many factors, including medication, stress, sleep, exercise, food, hydration, pre-existing medical conditions, and more. It is recommended to use the knowledge from this video to become more involved with your lab results and evaluate your numbers to speak with your doctor.   Diseases of Our Time - Overview  Clinical staff conducted group or individual video education with verbal and written material and guidebook.  Patient learns that  according to the CDC, 50% to 70% of chronic diseases (such as obesity, type 2 diabetes, elevated lipids, hypertension, and heart disease) are avoidable through lifestyle improvements including healthier food choices, listening to satiety cues, and increased physical activity.  Sleep Disorders Clinical staff conducted group or individual video education with verbal and written material and guidebook.  Patient learns how good quality and duration of sleep are important to overall health and well-being. Patient also learns about sleep disorders and how they impact health along with recommendations to address  them, including discussing with a physician.  Nutrition  Dining Out - Part 2 Clinical staff conducted group or individual video education with verbal and written material and guidebook.  Patient learns how to plan ahead and communicate in order to maximize their dining experience in a healthy and nutritious manner. Included are recommended food choices based on the type of restaurant the patient is visiting.   Fueling a Banker conducted group or individual video education with verbal and written material and guidebook.  There is a strong connection between our food choices and our health. Diseases like obesity and type 2 diabetes are very prevalent and are in large-part due to lifestyle choices. The Pritikin Eating Plan provides plenty of food and hunger-curbing satisfaction. It is easy to follow, affordable, and helps reduce health risks.  Menu Workshop  Clinical staff conducted group or individual video education with verbal and written material and guidebook.  Patient learns that restaurant meals can sabotage health goals because they are often packed with calories, fat, sodium, and sugar. Recommendations include strategies to plan ahead and to communicate with the manager, chef, or server to help order a healthier meal.  Planning Your Eating Strategy  Clinical staff  conducted group or individual video education with verbal and written material and guidebook.  Patient learns about the Pritikin Eating Plan and its benefit of reducing the risk of disease. The Pritikin Eating Plan does not focus on calories. Instead, it emphasizes high-quality, nutrient-rich foods. By knowing the characteristics of the foods, we choose, we can determine their calorie density and make informed decisions.  Targeting Your Nutrition Priorities  Clinical staff conducted group or individual video education with verbal and written material and guidebook.  Patient learns that lifestyle habits have a tremendous impact on disease risk and progression. This video provides eating and physical activity recommendations based on your personal health goals, such as reducing LDL cholesterol, losing weight, preventing or controlling type 2 diabetes, and reducing high blood pressure.  Vitamins and Minerals  Clinical staff conducted group or individual video education with verbal and written material and guidebook.  Patient learns different ways to obtain key vitamins and minerals, including through a recommended healthy diet. It is important to discuss all supplements you take with your doctor.   Healthy Mind-Set    Smoking Cessation  Clinical staff conducted group or individual video education with verbal and written material and guidebook.  Patient learns that cigarette smoking and tobacco addiction pose a serious health risk which affects millions of people. Stopping smoking will significantly reduce the risk of heart disease, lung disease, and many forms of cancer. Recommended strategies for quitting are covered, including working with your doctor to develop a successful plan.  Culinary   Becoming a Set designer conducted group or individual video education with verbal and written material and guidebook.  Patient learns that cooking at home can be healthy, cost-effective,  quick, and puts them in control. Keys to cooking healthy recipes will include looking at your recipe, assessing your equipment needs, planning ahead, making it simple, choosing cost-effective seasonal ingredients, and limiting the use of added fats, salts, and sugars.  Cooking - Breakfast and Snacks  Clinical staff conducted group or individual video education with verbal and written material and guidebook.  Patient learns how important breakfast is to satiety and nutrition through the entire day. Recommendations include key foods to eat during breakfast to help stabilize blood sugar levels and to prevent overeating  at meals later in the day. Planning ahead is also a key component.  Cooking - Educational psychologist conducted group or individual video education with verbal and written material and guidebook.  Patient learns eating strategies to improve overall health, including an approach to cook more at home. Recommendations include thinking of animal protein as a side on your plate rather than center stage and focusing instead on lower calorie dense options like vegetables, fruits, whole grains, and plant-based proteins, such as beans. Making sauces in large quantities to freeze for later and leaving the skin on your vegetables are also recommended to maximize your experience.  Cooking - Healthy Salads and Dressing Clinical staff conducted group or individual video education with verbal and written material and guidebook.  Patient learns that vegetables, fruits, whole grains, and legumes are the foundations of the Pritikin Eating Plan. Recommendations include how to incorporate each of these in flavorful and healthy salads, and how to create homemade salad dressings. Proper handling of ingredients is also covered. Cooking - Soups and [employer] - Soups and Desserts Clinical staff conducted group or individual video education with verbal and written material and guidebook.  Patient  learns that Pritikin soups and desserts make for easy, nutritious, and delicious snacks and meal components that are low in sodium, fat, sugar, and calorie density, while high in vitamins, minerals, and filling fiber. Recommendations include simple and healthy ideas for soups and desserts.   Overview     The Pritikin Solution Program Overview Clinical staff conducted group or individual video education with verbal and written material and guidebook.  Patient learns that the results of the Pritikin Program have been documented in more than 100 articles published in peer-reviewed journals, and the benefits include reducing risk factors for (and, in some cases, even reversing) high cholesterol, high blood pressure, type 2 diabetes, obesity, and more! An overview of the three key pillars of the Pritikin Program will be covered: eating well, doing regular exercise, and having a healthy mind-set.  WORKSHOPS  Exercise: Exercise Basics: Building Your Action Plan Clinical staff led group instruction and group discussion with PowerPoint presentation and patient guidebook. To enhance the learning environment the use of posters, models and videos may be added. At the conclusion of this workshop, patients will comprehend the difference between physical activity and exercise, as well as the benefits of incorporating both, into their routine. Patients will understand the FITT (Frequency, Intensity, Time, and Type) principle and how to use it to build an exercise action plan. In addition, safety concerns and other considerations for exercise and cardiac rehab will be addressed by the presenter. The purpose of this lesson is to promote a comprehensive and effective weekly exercise routine in order to improve patients' overall level of fitness.   Managing Heart Disease: Your Path to a Healthier Heart Clinical staff led group instruction and group discussion with PowerPoint presentation and patient guidebook. To  enhance the learning environment the use of posters, models and videos may be added.At the conclusion of this workshop, patients will understand the anatomy and physiology of the heart. Additionally, they will understand how Pritikin's three pillars impact the risk factors, the progression, and the management of heart disease.  The purpose of this lesson is to provide a high-level overview of the heart, heart disease, and how the Pritikin lifestyle positively impacts risk factors.  Exercise Biomechanics Clinical staff led group instruction and group discussion with PowerPoint presentation and patient guidebook. To enhance the  learning environment the use of posters, models and videos may be added. Patients will learn how the structural parts of their bodies function and how these functions impact their daily activities, movement, and exercise. Patients will learn how to promote a neutral spine, learn how to manage pain, and identify ways to improve their physical movement in order to promote healthy living. The purpose of this lesson is to expose patients to common physical limitations that impact physical activity. Participants will learn practical ways to adapt and manage aches and pains, and to minimize their effect on regular exercise. Patients will learn how to maintain good posture while sitting, walking, and lifting.  Balance Training and Fall Prevention  Clinical staff led group instruction and group discussion with PowerPoint presentation and patient guidebook. To enhance the learning environment the use of posters, models and videos may be added. At the conclusion of this workshop, patients will understand the importance of their sensorimotor skills (vision, proprioception, and the vestibular system) in maintaining their ability to balance as they age. Patients will apply a variety of balancing exercises that are appropriate for their current level of function. Patients will understand  the common causes for poor balance, possible solutions to these problems, and ways to modify their physical environment in order to minimize their fall risk. The purpose of this lesson is to teach patients about the importance of maintaining balance as they age and ways to minimize their risk of falling.  WORKSHOPS   Nutrition:  Fueling a Ship broker led group instruction and group discussion with PowerPoint presentation and patient guidebook. To enhance the learning environment the use of posters, models and videos may be added. Patients will review the foundational principles of the Pritikin Eating Plan and understand what constitutes a serving size in each of the food groups. Patients will also learn Pritikin-friendly foods that are better choices when away from home and review make-ahead meal and snack options. Calorie density will be reviewed and applied to three nutrition priorities: weight maintenance, weight loss, and weight gain. The purpose of this lesson is to reinforce (in a group setting) the key concepts around what patients are recommended to eat and how to apply these guidelines when away from home by planning and selecting Pritikin-friendly options. Patients will understand how calorie density may be adjusted for different weight management goals.  Mindful Eating  Clinical staff led group instruction and group discussion with PowerPoint presentation and patient guidebook. To enhance the learning environment the use of posters, models and videos may be added. Patients will briefly review the concepts of the Pritikin Eating Plan and the importance of low-calorie dense foods. The concept of mindful eating will be introduced as well as the importance of paying attention to internal hunger signals. Triggers for non-hunger eating and techniques for dealing with triggers will be explored. The purpose of this lesson is to provide patients with the opportunity to review the basic  principles of the Pritikin Eating Plan, discuss the value of eating mindfully and how to measure internal cues of hunger and fullness using the Hunger Scale. Patients will also discuss reasons for non-hunger eating and learn strategies to use for controlling emotional eating.  Targeting Your Nutrition Priorities Clinical staff led group instruction and group discussion with PowerPoint presentation and patient guidebook. To enhance the learning environment the use of posters, models and videos may be added. Patients will learn how to determine their genetic susceptibility to disease by reviewing their family history. Patients will  gain insight into the importance of diet as part of an overall healthy lifestyle in mitigating the impact of genetics and other environmental insults. The purpose of this lesson is to provide patients with the opportunity to assess their personal nutrition priorities by looking at their family history, their own health history and current risk factors. Patients will also be able to discuss ways of prioritizing and modifying the Pritikin Eating Plan for their highest risk areas  Menu  Clinical staff led group instruction and group discussion with PowerPoint presentation and patient guidebook. To enhance the learning environment the use of posters, models and videos may be added. Using menus brought in from [employer], or printed from [employer], patients will apply the Pritikin dining out guidelines that were presented in the [employer] video. Patients will also be able to practice these guidelines in a variety of provided scenarios. The purpose of this lesson is to provide patients with the opportunity to practice hands-on learning of the Pritikin Dining Out guidelines with actual menus and practice scenarios.  Label Reading Clinical staff led group instruction and group discussion with PowerPoint presentation and patient guidebook. To enhance the  learning environment the use of posters, models and videos may be added. Patients will review and discuss the Pritikin label reading guidelines presented in Pritikin's Label Reading Educational series video. Using fool labels brought in from local grocery stores and markets, patients will apply the label reading guidelines and determine if the packaged food meet the Pritikin guidelines. The purpose of this lesson is to provide patients with the opportunity to review, discuss, and practice hands-on learning of the Pritikin Label Reading guidelines with actual packaged food labels. Cooking School  Pritikin's [employer] are designed to teach patients ways to prepare quick, simple, and affordable recipes at home. The importance of nutrition's role in chronic disease risk reduction is reflected in its emphasis in the overall Pritikin program. By learning how to prepare essential core Pritikin Eating Plan recipes, patients will increase control over what they eat; be able to customize the flavor of foods without the use of added salt, sugar, or fat; and improve the quality of the food they consume. By learning a set of core recipes which are easily assembled, quickly prepared, and affordable, patients are more likely to prepare more healthy foods at home. These workshops focus on convenient breakfasts, simple entres, side dishes, and desserts which can be prepared with minimal effort and are consistent with nutrition recommendations for cardiovascular risk reduction. Cooking [employer] are taught by a Armed forces logistics/support/administrative officer (RD) who has been trained by the [employer]. The chef or RD has a clear understanding of the importance of minimizing - if not completely eliminating - added fat, sugar, and sodium in recipes. Throughout the series of Cooking School Workshop sessions, patients will learn about healthy ingredients and efficient methods of cooking to build confidence in their  capability to prepare    Cooking School weekly topics:  Adding Flavor- Sodium-Free  Fast and Healthy Breakfasts  Powerhouse Plant-Based Proteins  Satisfying Salads and Dressings  Simple Sides and Sauces  International Cuisine-Spotlight on the [employer] Zones  Delicious Desserts  Savory Soups  [employer] - Meals in a Astronomer Appetizers and Snacks  Comforting Weekend Breakfasts  One-Pot Wonders   Fast Evening Meals  Landscape architect Your Pritikin Plate  WORKSHOPS   Healthy Mindset (Psychosocial):  Focused Goals, Sustainable Changes Clinical staff led  group instruction and group discussion with PowerPoint presentation and patient guidebook. To enhance the learning environment the use of posters, models and videos may be added. Patients will be able to apply effective goal setting strategies to establish at least one personal goal, and then take consistent, meaningful action toward that goal. They will learn to identify common barriers to achieving personal goals and develop strategies to overcome them. Patients will also gain an understanding of how our mind-set can impact our ability to achieve goals and the importance of cultivating a positive and growth-oriented mind-set. The purpose of this lesson is to provide patients with a deeper understanding of how to set and achieve personal goals, as well as the tools and strategies needed to overcome common obstacles which may arise along the way.  From Head to Heart: The Power of a Healthy Outlook  Clinical staff led group instruction and group discussion with PowerPoint presentation and patient guidebook. To enhance the learning environment the use of posters, models and videos may be added. Patients will be able to recognize and describe the impact of emotions and mood on physical health. They will discover the importance of self-care and explore self-care practices which may work for them. Patients will also learn how  to utilize the 4 C's to cultivate a healthier outlook and better manage stress and challenges. The purpose of this lesson is to demonstrate to patients how a healthy outlook is an essential part of maintaining good health, especially as they continue their cardiac rehab journey.  Healthy Sleep for a Healthy Heart Clinical staff led group instruction and group discussion with PowerPoint presentation and patient guidebook. To enhance the learning environment the use of posters, models and videos may be added. At the conclusion of this workshop, patients will be able to demonstrate knowledge of the importance of sleep to overall health, well-being, and quality of life. They will understand the symptoms of, and treatments for, common sleep disorders. Patients will also be able to identify daytime and nighttime behaviors which impact sleep, and they will be able to apply these tools to help manage sleep-related challenges. The purpose of this lesson is to provide patients with a general overview of sleep and outline the importance of quality sleep. Patients will learn about a few of the most common sleep disorders. Patients will also be introduced to the concept of "sleep hygiene," and discover ways to self-manage certain sleeping problems through simple daily behavior changes. Finally, the workshop will motivate patients by clarifying the links between quality sleep and their goals of heart-healthy living.   Recognizing and Reducing Stress Clinical staff led group instruction and group discussion with PowerPoint presentation and patient guidebook. To enhance the learning environment the use of posters, models and videos may be added. At the conclusion of this workshop, patients will be able to understand the types of stress reactions, differentiate between acute and chronic stress, and recognize the impact that chronic stress has on their health. They will also be able to apply different coping mechanisms, such as  reframing negative self-talk. Patients will have the opportunity to practice a variety of stress management techniques, such as deep abdominal breathing, progressive muscle relaxation, and/or guided imagery.  The purpose of this lesson is to educate patients on the role of stress in their lives and to provide healthy techniques for coping with it.  Learning Barriers/Preferences:  Learning Barriers/Preferences - 05/28/22 1249       Learning Barriers/Preferences   Learning Barriers Sight  wears glasses   Learning Preferences Skilled Demonstration;Individual Instruction             Education Topics:  Knowledge Questionnaire Score:  Knowledge Questionnaire Score - 08/19/22 1610       Knowledge Questionnaire Score   Post Score 24/24             Core Components/Risk Factors/Patient Goals at Admission:  Personal Goals and Risk Factors at Admission - 05/28/22 1248       Core Components/Risk Factors/Patient Goals on Admission    Weight Management Yes;Obesity    Intervention Weight Management: Develop a combined nutrition and exercise program designed to reach desired caloric intake, while maintaining appropriate intake of nutrient and fiber, sodium and fats, and appropriate energy expenditure required for the weight goal.;Weight Management: Provide education and appropriate resources to help participant work on and attain dietary goals.;Weight Management/Obesity: Establish reasonable short term and long term weight goals.    Expected Outcomes Short Term: Continue to assess and modify interventions until short term weight is achieved;Long Term: Adherence to nutrition and physical activity/exercise program aimed toward attainment of established weight goal;Understanding recommendations for meals to include 15-35% energy as protein, 25-35% energy from fat, 35-60% energy from carbohydrates, less than 200mg  of dietary cholesterol, 20-35 gm of total fiber daily;Understanding of distribution  of calorie intake throughout the day with the consumption of 4-5 meals/snacks    Heart Failure Yes    Intervention Provide a combined exercise and nutrition program that is supplemented with education, support and counseling about heart failure. Directed toward relieving symptoms such as shortness of breath, decreased exercise tolerance, and extremity edema.    Expected Outcomes Improve functional capacity of life;Short term: Attendance in program 2-3 days a week with increased exercise capacity. Reported lower sodium intake. Reported increased fruit and vegetable intake. Reports medication compliance.;Short term: Daily weights obtained and reported for increase. Utilizing diuretic protocols set by physician.    Hypertension Yes    Intervention Provide education on lifestyle modifcations including regular physical activity/exercise, weight management, moderate sodium restriction and increased consumption of fresh fruit, vegetables, and low fat dairy, alcohol moderation, and smoking cessation.;Monitor prescription use compliance.    Expected Outcomes Short Term: Continued assessment and intervention until BP is < 140/20mm HG in hypertensive participants. < 130/29mm HG in hypertensive participants with diabetes, heart failure or chronic kidney disease.;Long Term: Maintenance of blood pressure at goal levels.    Lipids Yes    Intervention Provide education and support for participant on nutrition & aerobic/resistive exercise along with prescribed medications to achieve LDL 70mg , HDL >40mg .    Expected Outcomes Short Term: Participant states understanding of desired cholesterol values and is compliant with medications prescribed. Participant is following exercise prescription and nutrition guidelines.;Long Term: Cholesterol controlled with medications as prescribed, with individualized exercise RX and with personalized nutrition plan. Value goals: LDL < 70mg , HDL > 40 mg.             Core  Components/Risk Factors/Patient Goals Review:   Goals and Risk Factor Review     Row Name 06/06/22 1623 07/03/22 1412 07/25/22 1709 08/20/22 1726       Core Components/Risk Factors/Patient Goals Review   Personal Goals Review Weight Management/Obesity;Heart Failure;Stress;Hypertension;Lipids Weight Management/Obesity;Heart Failure;Stress;Hypertension;Lipids Weight Management/Obesity;Heart Failure;Stress;Hypertension;Lipids Weight Management/Obesity;Heart Failure;Stress;Hypertension;Lipids    Review [name] started intensive cardiac rehab on 06/03/22. [name] is off to a good start to exercise. Vital signs have been stable [name] is doing well with exercise at  intensive cardiac rehab .  Vital signs have been stable. [name] has lost 1.5 kg since starting the program [name] is doing well with exercise at  intensive cardiac rehab . Vital signs have been stable. [name] has lost 2.5 kg since starting the program. [name] will complete intensive cardiac rehab on 08/09/22 [name]  continues to  do well with exercise at  intensive cardiac rehab . Vital signs have been stable. [name] has lost 3.5 kg since starting the program. [name] will complete intensive cardiac rehab on 08/23/22    Expected Outcomes [name] will continue to participate in intensive cardiac rehab for exercise, nutrition and lifestyle modifications [name] will continue to participate in intensive cardiac rehab for exercise, nutrition and lifestyle modifications [name] will continue to participate in intensive cardiac rehab for exercise, nutrition and lifestyle modifications [name] will continue to participate in intensive cardiac rehab for exercise, nutrition and lifestyle modifications             Core Components/Risk Factors/Patient Goals at Discharge (Final Review):   Goals and Risk Factor Review - 08/20/22 1726       Core Components/Risk Factors/Patient Goals Review   Personal Goals Review Weight Management/Obesity;Heart  Failure;Stress;Hypertension;Lipids    Review [name]  continues to  do well with exercise at  intensive cardiac rehab . Vital signs have been stable. [name] has lost 3.5 kg since starting the program. [name] will complete intensive cardiac rehab on 08/23/22    Expected Outcomes [name] will continue to participate in intensive cardiac rehab for exercise, nutrition and lifestyle modifications             ITP Comments:  ITP Comments     Row Name 05/28/22 1103 06/06/22 1603 07/03/22 1409 07/25/22 1706 08/20/22 1724   ITP Comments Medical director - Armanda Magic, MD.  Introduction to the [facility] / Intensive Cardiac Rehab.  Inital orientation packet reviewed with the patient. 30 Day ITP Review. [name] started intensive cardiac rehab on 06/03/22 and is off to a good start to exercise. 30 Day ITP Review. [name] has good attendance and participation in  intensive cardiac rehab 30 Day ITP Review. [name] has good attendance and participation in  intensive cardiac rehab. [name] will complete intensive cardiac rehab on 08/09/22 30 Day ITP Review. [name] has good attendance and participation in  intensive cardiac rehab. [name] will complete intensive cardiac rehab on 09/08/22            Comments: See ITP comments

## 2022-08-21 ENCOUNTER — Encounter (HOSPITAL_COMMUNITY)
Admission: RE | Admit: 2022-08-21 | Discharge: 2022-08-21 | Disposition: A | Discharge status: Home / Self Care | Payer: PPO | Source: Ambulatory Visit | Attending: Cardiovascular Disease | Admitting: Cardiovascular Disease

## 2022-08-21 VITALS — Ht 65.0 in | Wt 253.5 lb

## 2022-08-21 DIAGNOSIS — I5022 Chronic systolic (congestive) heart failure: Secondary | ICD-10-CM

## 2022-08-22 ENCOUNTER — Telehealth: Payer: Self-pay | Admitting: *Deleted

## 2022-08-22 NOTE — Telephone Encounter (Signed)
Patient with diagnosis of A fib on Eliquis for anticoagulation.    Procedure: endoscopy/colonoscopy Date of procedure: 11/07/22   CHA2DS2-VASc Score = 5  This indicates a 7.2% annual risk of stroke. The patient's score is based upon: CHF History: 1 HTN History: 1 Diabetes History: 0 Stroke History: 0 Vascular Disease History: 0 Age Score: 2 Gender Score: 1     CrCl 65 mL/min Platelet count 302K   Per office protocol, patient can hold Eliquis for 2 days prior to procedure.    **This guidance is not considered finalized until pre-operative APP has relayed final recommendations.**

## 2022-08-22 NOTE — Telephone Encounter (Signed)
   Pre-operative Risk Assessment    Patient Name: Leslie [name]  DOB: [birth year] MRN: [id number]      Request for Surgical Clearance    Procedure:   Endoscopy/Colonoscopy  Date of Surgery:  Clearance 11/07/22                                 Surgeon:  Dr. Kerin Salen Surgeon's Group or Practice Name:  [facility] Phone number:  [phone] Fax number:  [phone]   Type of Clearance Requested:   - Medical  - Pharmacy:  Hold Apixaban (Eliquis) Not Indicated   Type of Anesthesia:  Not Indicated   Additional requests/questions:    Signed, Emmit Pomfret   08/22/2022, 10:35 AM

## 2022-08-23 ENCOUNTER — Encounter (HOSPITAL_COMMUNITY)
Admission: RE | Admit: 2022-08-23 | Discharge: 2022-08-23 | Disposition: A | Discharge status: Home / Self Care | Payer: PPO | Source: Ambulatory Visit | Attending: Cardiovascular Disease | Admitting: Cardiovascular Disease

## 2022-08-23 DIAGNOSIS — I5022 Chronic systolic (congestive) heart failure: Secondary | ICD-10-CM

## 2022-08-23 DIAGNOSIS — G4733 Obstructive sleep apnea (adult) (pediatric): Secondary | ICD-10-CM | POA: Diagnosis not present

## 2022-08-23 NOTE — Progress Notes (Signed)
Discharge Progress Report  Patient Details  Name: Leslie [name] MRN: [id number] Date of Birth: 22-Apr-1945 Referring Provider:   Flowsheet Row INTENSIVE CARDIAC REHAB ORIENT from 05/28/2022 in [facility]  Referring Provider Thurmon Fair, MD        Number of Visits: 87  Reason for Discharge:  Patient reached a stable level of exercise. Patient independent in their exercise. Patient has met program and personal goals.  Smoking History:  Social History   Tobacco Use  Smoking Status Former  Smokeless Tobacco Never  Tobacco Comments   07/30/2016 "social smoker only; in college"    Diagnosis:  Heart failure, chronic systolic (HCC)  ADL [facility]:   Initial Exercise Prescription:  Initial Exercise Prescription - 05/28/22 1300       Date of Initial Exercise RX and Referring Provider   Date 05/28/22    Referring Provider Thurmon Fair, MD    Expected Discharge Date 08/09/22      T5 Nustep   Level 1    SPM 75    Minutes 25    METs 2      Prescription Details   Frequency (times per week) 3    Duration Progress to 30 minutes of continuous aerobic without signs/symptoms of physical distress      Intensity   THRR 40-80% of Max Heartrate 58 - 115    Ratings of Perceived Exertion 11-13    Perceived Dyspnea 0-4      Progression   Progression Continue progressive overload as per policy without signs/symptoms or physical distress.      Resistance Training   Training Prescription Yes    Weight 2 lbs    Reps 10-15             Discharge Exercise Prescription (Final Exercise Prescription Changes):  Exercise Prescription Changes - 08/23/22 1500       Response to Exercise   Blood Pressure (Admit) 118/68    Blood Pressure (Exercise) 107/56    Blood Pressure (Exit) 106/72    Heart Rate (Admit) 53 bpm    Heart Rate (Exercise) 125 bpm    Heart Rate (Exit) 59 bpm    Rating of Perceived Exertion (Exercise)  11    Symptoms None    Comments Pt graduated from the CRP2 program    Duration Continue with 30 min of aerobic exercise without signs/symptoms of physical distress.    Intensity THRR unchanged      Progression   Progression Continue to progress workloads to maintain intensity without signs/symptoms of physical distress.    Average METs 2.8      Resistance Training   Training Prescription Yes    Weight 2 lbs    Reps 10-15    Time 10 Minutes      Interval Training   Interval Training No      T5 Nustep   Level 2    SPM 122    Minutes 30    METs 2.8      Home Exercise Plan   Plans to continue exercise at Home (comment)    Frequency Add 2 additional days to program exercise sessions.    Initial Home Exercises Provided 08/07/22             Functional Capacity:  6 Minute Walk     Row Name 05/28/22 1256 08/21/22 1359       6 Minute Walk   Phase Initial  Nustep used Discharge  Nustep used    Distance 1574 feet  Nustep used 2329 feet    Distance % Change -- 47.97 %    Distance Feet Change -- 755 ft    Walk Time 6 minutes 6 minutes  Nustep    # of Rest Breaks 0 0    MPH 3 4.4    METS 2.2 3.2    RPE 11 13    Perceived Dyspnea  0 0    VO2 Peak 7.1 11.29    Symptoms Yes (comment) Yes (comment)    Comments Rt shoulder apin 6/10; right knee pain 6/10; left knee pain 5/10; left shoulder pain 4/10; left foot pain 5/10 BL leg fatigue    Resting HR 68 bpm 63 bpm    Resting BP 110/64 98/70    Resting Oxygen Saturation  98 % --    Exercise Oxygen Saturation  during 6 min walk 97 % --    Max Ex. HR 100 bpm 82 bpm    Max Ex. BP 114/74 110/56    2 Minute Post BP 104/70 --             Psychological, QOL, Others - Outcomes: PHQ 2/9:    08/23/2022    1:58 PM 05/28/2022   10:55 AM 02/22/2022    3:00 PM  Depression screen PHQ 2/9  Decreased Interest 0 0 0  Down, Depressed, Hopeless 0 0 0  PHQ - 2 Score 0 0 0  Altered sleeping 3 2   Tired, decreased energy 0 1    Change in appetite 0 0   Feeling bad or failure about yourself  0 0   Trouble concentrating 0 0   Moving slowly or fidgety/restless 0 0   Suicidal thoughts 0 0   PHQ-9 Score 3 3   Difficult doing work/chores Somewhat difficult Not difficult at all     Quality of Life:  Quality of Life - 08/19/22 1615       Quality of Life Scores   Health/Function Pre 21.39 %    Health/Function Post 24 %    Health/Function % Change 12.2 %    Socioeconomic Pre 30 %    Socioeconomic Post 30 %    Socioeconomic % Change  0 %    Psych/Spiritual Pre 23.79 %    Psych/Spiritual Post 26.36 %    Psych/Spiritual % Change 10.8 %    Family Pre 27 %    Family Post 28.5 %    Family % Change 5.56 %    GLOBAL Pre 24.32 %    GLOBAL Post 26.2 %    GLOBAL % Change 7.73 %             Personal Goals: Goals established at orientation with interventions provided to work toward goal.  Personal Goals and Risk Factors at Admission - 05/28/22 1248       Core Components/Risk Factors/Patient Goals on Admission    Weight Management Yes;Obesity    Intervention Weight Management: Develop a combined nutrition and exercise program designed to reach desired caloric intake, while maintaining appropriate intake of nutrient and fiber, sodium and fats, and appropriate energy expenditure required for the weight goal.;Weight Management: Provide education and appropriate resources to help participant work on and attain dietary goals.;Weight Management/Obesity: Establish reasonable short term and long term weight goals.    Expected Outcomes Short Term: Continue to assess and modify interventions until short term weight is achieved;Long Term: Adherence to nutrition and physical activity/exercise program aimed toward  attainment of established weight goal;Understanding recommendations for meals to include 15-35% energy as protein, 25-35% energy from fat, 35-60% energy from carbohydrates, less than 200mg  of dietary cholesterol, 20-35  gm of total fiber daily;Understanding of distribution of calorie intake throughout the day with the consumption of 4-5 meals/snacks    Heart Failure Yes    Intervention Provide a combined exercise and nutrition program that is supplemented with education, support and counseling about heart failure. Directed toward relieving symptoms such as shortness of breath, decreased exercise tolerance, and extremity edema.    Expected Outcomes Improve functional capacity of life;Short term: Attendance in program 2-3 days a week with increased exercise capacity. Reported lower sodium intake. Reported increased fruit and vegetable intake. Reports medication compliance.;Short term: Daily weights obtained and reported for increase. Utilizing diuretic protocols set by physician.    Hypertension Yes    Intervention Provide education on lifestyle modifcations including regular physical activity/exercise, weight management, moderate sodium restriction and increased consumption of fresh fruit, vegetables, and low fat dairy, alcohol moderation, and smoking cessation.;Monitor prescription use compliance.    Expected Outcomes Short Term: Continued assessment and intervention until BP is < 140/22mm HG in hypertensive participants. < 130/5mm HG in hypertensive participants with diabetes, heart failure or chronic kidney disease.;Long Term: Maintenance of blood pressure at goal levels.    Lipids Yes    Intervention Provide education and support for participant on nutrition & aerobic/resistive exercise along with prescribed medications to achieve LDL 70mg , HDL >40mg .    Expected Outcomes Short Term: Participant states understanding of desired cholesterol values and is compliant with medications prescribed. Participant is following exercise prescription and nutrition guidelines.;Long Term: Cholesterol controlled with medications as prescribed, with individualized exercise RX and with personalized nutrition plan. Value goals: LDL <  70mg , HDL > 40 mg.              Personal Goals Discharge:  Goals and Risk Factor Review     Row Name 06/06/22 1623 07/03/22 1412 07/25/22 1709 08/20/22 1726       Core Components/Risk Factors/Patient Goals Review   Personal Goals Review Weight Management/Obesity;Heart Failure;Stress;Hypertension;Lipids Weight Management/Obesity;Heart Failure;Stress;Hypertension;Lipids Weight Management/Obesity;Heart Failure;Stress;Hypertension;Lipids Weight Management/Obesity;Heart Failure;Stress;Hypertension;Lipids    Review [name] started intensive cardiac rehab on 06/03/22. [name] is off to a good start to exercise. Vital signs have been stable [name] is doing well with exercise at  intensive cardiac rehab . Vital signs have been stable. [name] has lost 1.5 kg since starting the program [name] is doing well with exercise at  intensive cardiac rehab . Vital signs have been stable. [name] has lost 2.5 kg since starting the program. [name] will complete intensive cardiac rehab on 08/09/22 [name]  continues to  do well with exercise at  intensive cardiac rehab . Vital signs have been stable. [name] has lost 3.5 kg since starting the program. [name] will complete intensive cardiac rehab on 08/23/22    Expected Outcomes [name] will continue to participate in intensive cardiac rehab for exercise, nutrition and lifestyle modifications [name] will continue to participate in intensive cardiac rehab for exercise, nutrition and lifestyle modifications [name] will continue to participate in intensive cardiac rehab for exercise, nutrition and lifestyle modifications [name] will continue to participate in intensive cardiac rehab for exercise, nutrition and lifestyle modifications             Exercise Goals and Review:  Exercise Goals     Row Name 05/28/22 1259             Exercise  Goals   Increase Physical Activity Yes       Intervention Provide advice, education, support and counseling about physical activity/exercise  needs.;Develop an individualized exercise prescription for aerobic and resistive training based on initial evaluation findings, risk stratification, comorbidities and participant's personal goals.       Expected Outcomes Short Term: Attend rehab on a regular basis to increase amount of physical activity.;Long Term: Add in home exercise to make exercise part of routine and to increase amount of physical activity.;Long Term: Exercising regularly at least 3-5 days a week.       Increase Strength and Stamina Yes       Intervention Provide advice, education, support and counseling about physical activity/exercise needs.;Develop an individualized exercise prescription for aerobic and resistive training based on initial evaluation findings, risk stratification, comorbidities and participant's personal goals.       Expected Outcomes Short Term: Increase workloads from initial exercise prescription for resistance, speed, and METs.;Short Term: Perform resistance training exercises routinely during rehab and add in resistance training at home;Long Term: Improve cardiorespiratory fitness, muscular endurance and strength as measured by increased METs and functional capacity ( )       Able to understand and use rate of perceived exertion (RPE) scale Yes       Intervention Provide education and explanation on how to use RPE scale       Expected Outcomes Short Term: Able to use RPE daily in rehab to express subjective intensity level;Long Term:  Able to use RPE to guide intensity level when exercising independently       Knowledge and understanding of Target Heart Rate Range (THRR) Yes       Intervention Provide education and explanation of THRR including how the numbers were predicted and where they are located for reference       Expected Outcomes Short Term: Able to state/look up THRR;Short Term: Able to use daily as guideline for intensity in rehab;Long Term: Able to use THRR to govern intensity when exercising  independently       Understanding of Exercise Prescription Yes       Intervention Provide education, explanation, and written materials on patient's individual exercise prescription       Expected Outcomes Short Term: Able to explain program exercise prescription;Long Term: Able to explain home exercise prescription to exercise independently                Exercise Goals Re-Evaluation:  Exercise Goals Re-Evaluation     Row Name 06/03/22 1548 06/28/22 1520 08/23/22 1425         Exercise Goal Re-Evaluation   Exercise Goals Review Increase Physical Activity;Increase Strength and Stamina;Able to understand and use rate of perceived exertion (RPE) scale;Knowledge and understanding of Target Heart Rate Range (THRR);Understanding of Exercise Prescription Increase Physical Activity;Increase Strength and Stamina;Able to understand and use rate of perceived exertion (RPE) scale;Knowledge and understanding of Target Heart Rate Range (THRR);Understanding of Exercise Prescription Increase Physical Activity;Increase Strength and Stamina;Able to understand and use rate of perceived exertion (RPE) scale;Knowledge and understanding of Target Heart Rate Range (THRR);Understanding of Exercise Prescription     Comments Pt's first day in the CRP2 program. Pt understands the exericse Rx, THRR, and RPE. Reviewed METs and goals. Pt voices she has improved stamina since beginning the CRP2 program. Patient completed the cardiac rehab program today and made steady progress. Patient plan to continue exercise at the [facility].     Expected Outcomes Will continue to monitor patient  and progress exercise workloads as tolerated. Will continue to monitor patient and progress exercise workloads as tolerated. Patient will continue exercise at fitness center to maintain health and fitness gains.              Nutrition & Weight - Outcomes:  Pre Biometrics - 05/28/22 1035       Pre Biometrics   Waist  Circumference 48 inches    Hip Circumference 55.5 inches    Waist to Hip Ratio 0.86 %    Triceps Skinfold 46 mm    % Body Fat 55.2 %    Grip Strength 20 kg    Flexibility 11.25 in    Single Leg Stand 1.85 seconds             Post Biometrics - 08/21/22 1415        Post  Biometrics   Height 5\' 5"  (1.651 m)    Weight 115 kg   -3.5lbs   Waist Circumference 46 inches   -2in   Hip Circumference 55 inches   -0.5in   Waist to Hip Ratio 0.84 %    BMI (Calculated) 42.19    Triceps Skinfold 48 mm   +56mm   % Body Fat 54 %   -2.17   Grip Strength 20 kg    Flexibility 11.25 in    Single Leg Stand 3.4 seconds             Nutrition:  Nutrition Therapy & Goals - 08/02/22 0829       Nutrition Therapy   Diet Heart healthy Diet    Drug/Food Interactions Statins/Certain Fruits      Personal Nutrition Goals   Nutrition Goal Patient to identify strategies for reducing cardiovascular risk by attending the weekly [facility] education and nutrition series    Personal Goal #2 Patient to improve diet quality by using the plate method as a daily guide for meal planning to include lean protein/plant protein, fruits, vegetables, whole grains, nonfat dairy as part of well balanced diet    Personal Goal #3 Patient to limit sodium to 1500mg  per day    Comments Goals in action. [name] reports increased intake of fruits, vegetables, whole grains. She continues to attend the [facility] education and nutrition series regularly. She is down 5.5# since starting with our program. [name] will benefit from partication in intensive cardiac rehab for nutrition, exercise, and lifestyle modification.      Intervention Plan   Intervention Prescribe, educate and counsel regarding individualized specific dietary modifications aiming towards targeted core components such as weight, hypertension, lipid management, diabetes, heart failure and other comorbidities.;Nutrition handout(s) given to patient.    Expected Outcomes  Short Term Goal: Understand basic principles of dietary content, such as calories, fat, sodium, cholesterol and nutrients.;Long Term Goal: Adherence to prescribed nutrition plan.             Nutrition Discharge:  Nutrition Assessments - 08/26/22 0958       Rate Your Plate Scores   Post Score 78             Education Questionnaire Score:  Knowledge Questionnaire Score - 08/19/22 1610       Knowledge Questionnaire Score   Post Score 24/24             Goals reviewed with patient; copy given to patient.Pt graduates from  Intensive/Traditional cardiac rehab program 08/23/22  with completion of  60 exercise and education sessions. Pt maintained good attendance and progressed nicely during their  participation in rehab as evidenced by increased MET level. [name] increased her distance on her post exercise nustep test by 755 feet.  Medication list reconciled. Repeat  PHQ score-3  .  Pt has made significant lifestyle changes and should be commended for their success. [name] achieved their goals during cardiac rehab.   Pt plans to continue exercise at [facility]. [name] lost 2.5 kg while participating in the program. We are proud of [name]'s progress!Thayer Headings RN BSN

## 2022-08-26 ENCOUNTER — Telehealth: Payer: Self-pay | Admitting: *Deleted

## 2022-08-26 DIAGNOSIS — E7800 Pure hypercholesterolemia, unspecified: Secondary | ICD-10-CM | POA: Diagnosis not present

## 2022-08-26 DIAGNOSIS — K219 Gastro-esophageal reflux disease without esophagitis: Secondary | ICD-10-CM | POA: Diagnosis not present

## 2022-08-26 DIAGNOSIS — I10 Essential (primary) hypertension: Secondary | ICD-10-CM | POA: Diagnosis not present

## 2022-08-26 DIAGNOSIS — I480 Paroxysmal atrial fibrillation: Secondary | ICD-10-CM | POA: Diagnosis not present

## 2022-08-26 DIAGNOSIS — M1990 Unspecified osteoarthritis, unspecified site: Secondary | ICD-10-CM | POA: Diagnosis not present

## 2022-08-26 DIAGNOSIS — N183 Chronic kidney disease, stage 3 unspecified: Secondary | ICD-10-CM | POA: Diagnosis not present

## 2022-08-26 DIAGNOSIS — J4521 Mild intermittent asthma with (acute) exacerbation: Secondary | ICD-10-CM | POA: Diagnosis not present

## 2022-08-26 NOTE — Telephone Encounter (Signed)
Pt has been scheduled for tele pre op appt 10/14/22 @ 10:20. Med rec and consent are done.

## 2022-08-26 NOTE — Telephone Encounter (Signed)
Pt has been scheduled for tele pre op appt 10/14/22 @ 10:20. Med rec and consent are done.     Patient Consent for Virtual Visit        QUINNLY CARLS has provided verbal consent on 08/26/2022 for a virtual visit (video or telephone).   CONSENT FOR VIRTUAL VISIT FOR:  [name]  By participating in this virtual visit I agree to the following:  I hereby voluntarily request, consent and authorize [facility] and its employed or contracted physicians, physician assistants, nurse practitioners or other licensed health care professionals (the Practitioner), to provide me with telemedicine health care services (the "Services") as deemed necessary by the treating Practitioner. I acknowledge and consent to receive the Services by the Practitioner via telemedicine. I understand that the telemedicine visit will involve communicating with the Practitioner through live audiovisual communication technology and the disclosure of certain medical information by electronic transmission. I acknowledge that I have been given the opportunity to request an in-person assessment or other available alternative prior to the telemedicine visit and am voluntarily participating in the telemedicine visit.  I understand that I have the right to withhold or withdraw my consent to the use of telemedicine in the course of my care at any time, without affecting my right to future care or treatment, and that the Practitioner or I may terminate the telemedicine visit at any time. I understand that I have the right to inspect all information obtained and/or recorded in the course of the telemedicine visit and may receive copies of available information for a reasonable fee.  I understand that some of the potential risks of receiving the Services via telemedicine include:  Delay or interruption in medical evaluation due to technological equipment failure or disruption; Information transmitted may not be sufficient (e.g.  poor resolution of images) to allow for appropriate medical decision making by the Practitioner; and/or  In rare instances, security protocols could fail, causing a breach of personal health information.  Furthermore, I acknowledge that it is my responsibility to provide information about my medical history, conditions and care that is complete and accurate to the best of my ability. I acknowledge that Practitioner's advice, recommendations, and/or decision may be based on factors not within their control, such as incomplete or inaccurate data provided by me or distortions of diagnostic images or specimens that may result from electronic transmissions. I understand that the practice of medicine is not an exact science and that Practitioner makes no warranties or guarantees regarding treatment outcomes. I acknowledge that a copy of this consent can be made available to me via my patient portal [facility] MyChart), or I can request a printed copy by calling the office of [facility].    I understand that my insurance will be billed for this visit.   I have read or had this consent read to me. I understand the contents of this consent, which adequately explains the benefits and risks of the Services being provided via telemedicine.  I have been provided ample opportunity to ask questions regarding this consent and the Services and have had my questions answered to my satisfaction. I give my informed consent for the services to be provided through the use of telemedicine in my medical care

## 2022-08-26 NOTE — Telephone Encounter (Signed)
   Name: ANELA ALLEMANG  DOB: [birth year]  MRN: [id number]  Primary Cardiologist: Thurmon Fair, MD  Chart reviewed as part of pre-operative protocol coverage. Because of TANISA TONI past medical history and time since last visit, she will require a follow-up telephone visit in order to better assess preoperative cardiovascular risk.  Pre-op covering staff: - Please schedule appointment and call patient to inform them. If patient already had an upcoming appointment within acceptable timeframe, please add "pre-op clearance" to the appointment notes so provider is aware. - Please contact requesting surgeon's office via preferred method (i.e, phone, fax) to inform them of need for appointment prior to surgery.   Per office protocol, patient can hold Eliquis for 2 days prior to procedure.   Sharlene Dory, PA-C  08/26/2022, 7:37 AM

## 2022-08-28 DIAGNOSIS — L720 Epidermal cyst: Secondary | ICD-10-CM | POA: Diagnosis not present

## 2022-08-28 DIAGNOSIS — L738 Other specified follicular disorders: Secondary | ICD-10-CM | POA: Diagnosis not present

## 2022-08-28 DIAGNOSIS — L821 Other seborrheic keratosis: Secondary | ICD-10-CM | POA: Diagnosis not present

## 2022-08-28 DIAGNOSIS — L820 Inflamed seborrheic keratosis: Secondary | ICD-10-CM | POA: Diagnosis not present

## 2022-08-28 DIAGNOSIS — D2372 Other benign neoplasm of skin of left lower limb, including hip: Secondary | ICD-10-CM | POA: Diagnosis not present

## 2022-08-28 DIAGNOSIS — D1801 Hemangioma of skin and subcutaneous tissue: Secondary | ICD-10-CM | POA: Diagnosis not present

## 2022-08-28 DIAGNOSIS — L438 Other lichen planus: Secondary | ICD-10-CM | POA: Diagnosis not present

## 2022-08-28 DIAGNOSIS — L718 Other rosacea: Secondary | ICD-10-CM | POA: Diagnosis not present

## 2022-08-29 DIAGNOSIS — E039 Hypothyroidism, unspecified: Secondary | ICD-10-CM | POA: Diagnosis not present

## 2022-09-03 ENCOUNTER — Ambulatory Visit
Admission: RE | Admit: 2022-09-03 | Discharge: 2022-09-03 | Disposition: A | Discharge status: Home / Self Care | Payer: PPO | Source: Ambulatory Visit | Attending: Family Medicine | Admitting: Family Medicine

## 2022-09-03 DIAGNOSIS — R29890 Loss of height: Secondary | ICD-10-CM | POA: Diagnosis not present

## 2022-09-03 DIAGNOSIS — E2839 Other primary ovarian failure: Secondary | ICD-10-CM

## 2022-09-03 DIAGNOSIS — E349 Endocrine disorder, unspecified: Secondary | ICD-10-CM | POA: Diagnosis not present

## 2022-09-03 DIAGNOSIS — M810 Age-related osteoporosis without current pathological fracture: Secondary | ICD-10-CM | POA: Diagnosis not present

## 2022-09-03 DIAGNOSIS — N951 Menopausal and female climacteric states: Secondary | ICD-10-CM | POA: Diagnosis not present

## 2022-09-17 DIAGNOSIS — R159 Full incontinence of feces: Secondary | ICD-10-CM | POA: Diagnosis not present

## 2022-09-17 DIAGNOSIS — N952 Postmenopausal atrophic vaginitis: Secondary | ICD-10-CM | POA: Diagnosis not present

## 2022-09-17 DIAGNOSIS — N816 Rectocele: Secondary | ICD-10-CM | POA: Diagnosis not present

## 2022-09-19 DIAGNOSIS — I4819 Other persistent atrial fibrillation: Secondary | ICD-10-CM | POA: Diagnosis not present

## 2022-09-19 DIAGNOSIS — R7309 Other abnormal glucose: Secondary | ICD-10-CM | POA: Diagnosis not present

## 2022-09-19 DIAGNOSIS — E039 Hypothyroidism, unspecified: Secondary | ICD-10-CM | POA: Diagnosis not present

## 2022-09-19 DIAGNOSIS — D6869 Other thrombophilia: Secondary | ICD-10-CM | POA: Diagnosis not present

## 2022-09-19 DIAGNOSIS — Z0000 Encounter for general adult medical examination without abnormal findings: Secondary | ICD-10-CM | POA: Diagnosis not present

## 2022-09-19 DIAGNOSIS — E7800 Pure hypercholesterolemia, unspecified: Secondary | ICD-10-CM | POA: Diagnosis not present

## 2022-09-19 DIAGNOSIS — I10 Essential (primary) hypertension: Secondary | ICD-10-CM | POA: Diagnosis not present

## 2022-09-19 DIAGNOSIS — E559 Vitamin D deficiency, unspecified: Secondary | ICD-10-CM | POA: Diagnosis not present

## 2022-09-19 DIAGNOSIS — R7303 Prediabetes: Secondary | ICD-10-CM | POA: Diagnosis not present

## 2022-09-19 DIAGNOSIS — Z79899 Other long term (current) drug therapy: Secondary | ICD-10-CM | POA: Diagnosis not present

## 2022-09-19 DIAGNOSIS — N1831 Chronic kidney disease, stage 3a: Secondary | ICD-10-CM | POA: Diagnosis not present

## 2022-09-19 DIAGNOSIS — Z6841 Body Mass Index (BMI) 40.0 and over, adult: Secondary | ICD-10-CM | POA: Diagnosis not present

## 2022-09-19 DIAGNOSIS — D508 Other iron deficiency anemias: Secondary | ICD-10-CM | POA: Diagnosis not present

## 2022-09-23 DIAGNOSIS — G4733 Obstructive sleep apnea (adult) (pediatric): Secondary | ICD-10-CM | POA: Diagnosis not present

## 2022-09-27 DIAGNOSIS — I10 Essential (primary) hypertension: Secondary | ICD-10-CM | POA: Diagnosis not present

## 2022-09-27 DIAGNOSIS — I509 Heart failure, unspecified: Secondary | ICD-10-CM | POA: Diagnosis not present

## 2022-09-27 DIAGNOSIS — R944 Abnormal results of kidney function studies: Secondary | ICD-10-CM | POA: Diagnosis not present

## 2022-10-14 ENCOUNTER — Ambulatory Visit: Payer: PPO | Attending: Cardiovascular Disease

## 2022-10-14 DIAGNOSIS — Z01810 Encounter for preprocedural cardiovascular examination: Secondary | ICD-10-CM | POA: Diagnosis not present

## 2022-10-14 NOTE — Progress Notes (Signed)
Virtual Visit via Telephone Note   Because of Leslie [name] co-morbid illnesses, she is at least at moderate risk for complications without adequate follow up.  This format is felt to be most appropriate for this patient at this time.  The patient did not have access to video technology/had technical difficulties with video requiring transitioning to audio format only (telephone).  All issues noted in this document were discussed and addressed.  No physical exam could be performed with this format.  Please refer to the patient's chart for her consent to telehealth for [facility].  Evaluation Performed:  Preoperative cardiovascular risk assessment _____________   Date:  10/14/2022   Patient ID:  Leslie [name], DOB 02-23-1946, MRN [id number] Patient Location:  Home Provider location:   Office  Primary Care Provider:  Shon Hale, MD Primary Cardiologist:  Thurmon Fair, MD  Chief Complaint / Patient Profile   77 y.o. y/o female with a h/o persistent AF, HFpEF, HTN, OSA, prediabetes, HLD, moderate MR, moderate AI, morbid obesity who is pending endoscopy/colonoscopy and presents today for telephonic preoperative cardiovascular risk assessment.  History of Present Illness    Leslie [name] is a 77 y.o. female who presents via audio/video conferencing for a telehealth visit today.  Pt was last seen in cardiology clinic on 07/30/2022 by Dr. Royann Shivers.  At that time Leslie [name] was doing well maintaining sinus rhythm and euvolemic on exam. The patient is now pending procedure as outlined above. Since her last visit, she has been doing well with no new cardiac complaints.  She did note an episode isolated with elevated blood pressures and her metoprolol was adjusted with improvement of blood pressures.  She recently graduated from cardiac rehab and is currently working with a Systems analyst at [employer] well.  She reports having more stamina lately and is feeling much  better.   Past Medical History    Past Medical History:  Diagnosis Date   Adenomatous polyp    Arthritis    "knees, legs, fingers" (07/30/2016)   Asthma    Bursitis of left shoulder    "just finished PT" (07/30/2016)   Chest pain    a. 2003 Abnl stress test-->Cath: nonobs CAD.   CHF (congestive heart failure) (HCC)    Chronic bronchitis (HCC)    Chronic kidney disease    stage 3   GERD (gastroesophageal reflux disease)    barrett's esophagus- Dr. Matthias Hughs   History of hiatal hernia    Hyperlipidemia    Hypertension    Hypothyroidism    Migraines    "sporatic; at least a few/year" (07/30/2016)   Mitral regurgitation    a. 07/2016 Echo: mild to mod MR;  b. 07/2016 TEE: mild MR.   Moderate aortic insufficiency    a. 07/2016 Echo: EF 55-60%, mod AI;  b. 07/2016 TEE: EF 50-55%, mod AI.   Obesity    s/p lap band surgery 6/10   OSA on CPAP    PAF (paroxysmal atrial fibrillation) (HCC)    a. 07/2016 TEE/DCCV: EF 50-55%, mild MR, mod AI, mild to mod TR, neg bubble study-->successful DCCV x1 w/ 120J.   PONV (postoperative nausea and vomiting)    Vitamin D deficiency    Past Surgical History:  Procedure Laterality Date   APPENDECTOMY     incidental   CARDIOVERSION N/A 08/01/2016   Procedure: CARDIOVERSION;  Surgeon: Jake Bathe, MD;  Location: [facility] ENDOSCOPY;  Service: Cardiovascular;  Laterality: N/A;   CARDIOVERSION  N/A 06/24/2019   Procedure: CARDIOVERSION;  Surgeon: Thurmon Fair, MD;  Location: [facility] ENDOSCOPY;  Service: Cardiovascular;  Laterality: N/A;   CARDIOVERSION N/A 05/30/2020   Procedure: CARDIOVERSION;  Surgeon: Thurmon Fair, MD;  Location: [facility] ENDOSCOPY;  Service: Cardiovascular;  Laterality: N/A;   CARDIOVERSION N/A 05/09/2020   Procedure: CARDIOVERSION;  Surgeon: Little Ishikawa, MD;  Location: [facility] ENDOSCOPY;  Service: Cardiovascular;  Laterality: N/A;   CATARACT EXTRACTION W/ INTRAOCULAR LENS  IMPLANT, BILATERAL Bilateral ~ 2008-2017   left - right   COMBINED  HYSTERECTOMY VAGINAL / OOPHORECTOMY / A&P REPAIR  1981   HERNIA REPAIR     INGUINAL HERNIA REPAIR Left 1952   LAPAROSCOPIC GASTRIC BANDING WITH HIATAL HERNIA REPAIR  09/2008   RIGHT/LEFT HEART CATH AND CORONARY ANGIOGRAPHY N/A 04/11/2022   Procedure: RIGHT/LEFT HEART CATH AND CORONARY ANGIOGRAPHY;  Surgeon: Corky Crafts, MD;  Location: [facility] INVASIVE CV LAB;  Service: Cardiovascular;  Laterality: N/A;   TEE WITHOUT CARDIOVERSION N/A 08/01/2016   Procedure: TRANSESOPHAGEAL ECHOCARDIOGRAM (TEE);  Surgeon: Jake Bathe, MD;  Location: [facility] ENDOSCOPY;  Service: Cardiovascular;  Laterality: N/A;   TONSILLECTOMY AND ADENOIDECTOMY  1974   VAGINAL HYSTERECTOMY  1981   "w/1 ovary"    Allergies  Allergies  Allergen Reactions   Clindamycin Hcl     Lip and face swelling   Augmentin [Amoxicillin-Pot Clavulanate] Diarrhea    Did it involve swelling of the face/tongue/throat, SOB, or low BP? No Did it involve sudden or severe rash/hives, skin peeling, or any reaction on the inside of your mouth or nose? No Did you need to seek medical attention at a hospital or doctor's office? No When did it last happen?      10 + years If all above answers are "NO", may proceed with cephalosporin use.    Codeine Nausea Only and Other (See Comments)    Hyperactivity   Demerol [Meperidine] Nausea And Vomiting   Sulfa Antibiotics Rash    Home Medications    Prior to Admission medications   Medication Sig Start Date End Date Taking? Authorizing Provider  acetaminophen (TYLENOL) 650 MG CR tablet Take 1,300 mg by mouth every 8 (eight) hours as needed for pain.    [provider]  amiodarone (PACERONE) 200 MG tablet Take 1 tablet (200 mg total) by mouth daily. 04/18/22   Joylene Grapes, NP  apixaban (ELIQUIS) 5 MG TABS tablet Take 1 tablet (5 mg total) by mouth 2 (two) times daily. 04/18/22   Joylene Grapes, NP  ARNUITY ELLIPTA 50 MCG/ACT AEPB Inhale into the lungs. 04/26/22   [provider]   atorvastatin (LIPITOR) 40 MG tablet Take 1 tablet (40 mg total) by mouth daily. 04/18/22   Joylene Grapes, NP  Bacillus Coagulans-Inulin (ALIGN PREBIOTIC-PROBIOTIC PO) Take 2 capsules by mouth daily. Gummy    [provider]  cholecalciferol (VITAMIN D) 1000 UNITS tablet Take 1,000 Units by mouth daily.    [provider]  diclofenac Sodium (VOLTAREN) 1 % GEL Apply 1 application topically 2 (two) times daily.    [provider]  fexofenadine (ALLEGRA) 180 MG tablet Take 180 mg by mouth daily.    [provider]  fluticasone (FLONASE) 50 MCG/ACT nasal spray Place 2 sprays into both nostrils at bedtime.     [provider]  furosemide (LASIX) 40 MG tablet Take 1 tablet (40 mg total) by mouth as needed. For weight greater than 261 lbs 04/30/22   Sherald Hess, NP  Glucosamine-Chondroitin (OSTEO BI-FLEX REGULAR STRENGTH PO) Take 1 tablet by mouth daily.    [provider]  levothyroxine (SYNTHROID, LEVOTHROID) 50 MCG tablet Take 50 mcg by mouth daily before breakfast.    [provider]  magnesium oxide (MAG-OX) 400 (240 Mg) MG tablet Take 1 tablet (400 mg total) by mouth daily. 06/03/22   Croitoru, Mihai, MD  metoprolol succinate (TOPROL-XL) 100 MG 24 hr tablet Take 50mg  in the morning and 100mg  in the evening 07/30/22   Croitoru, Mihai, MD  metroNIDAZOLE (METROCREAM) 0.75 % cream Apply 1 application  topically daily as needed. 05/16/20   [provider]  mometasone (ELOCON) 0.1 % cream Apply 1 application topically 2 (two) times daily as needed (skin irritation). Patient not taking: Reported on 07/30/2022 05/18/20   [provider]  Multiple Vitamins-Minerals (CENTRUM MULTIGUMMIES) CHEW Chew 1 tablet by mouth daily.    [provider]  omeprazole (PRILOSEC) 20 MG capsule Take 40 mg by mouth daily before breakfast. 07/27/16   [provider]  sacubitril-valsartan (ENTRESTO) 49-51 MG Take 1 tablet by mouth 2 (two)  times daily. 04/30/22   Clegg, Amy D, NP  senna (SENOKOT) 8.6 MG tablet Take 1 tablet by mouth every evening.    [provider]    Physical Exam    Vital Signs:  Leslie [name] does not have vital signs available for review today.  Given telephonic nature of communication, physical exam is limited. AAOx3. NAD. Normal affect.  Speech and respirations are unlabored.  Accessory Clinical Findings    None  Assessment & Plan    1.  Preoperative Cardiovascular Risk Assessment:  Patient's RCRI score is 0.9%  The patient affirms she has been doing well without any new cardiac symptoms. They are able to achieve 5 METS without cardiac limitations. Therefore, based on ACC/AHA guidelines, the patient would be at acceptable risk for the planned procedure without further cardiovascular testing. The patient was advised that if she develops new symptoms prior to surgery to contact our office to arrange for a follow-up visit, and she verbalized understanding.   The patient was advised that if she develops new symptoms prior to surgery to contact our office to arrange for a follow-up visit, and she verbalized understanding.  Patient can hold Eliquis 2 days prior to procedure and should restart postprocedure when surgically safe.  A copy of this note will be routed to requesting surgeon.  Time:   Today, I have spent 8 minutes with the patient with telehealth technology discussing medical history, symptoms, and management plan.     Napoleon Form, Leodis Rains, NP  10/14/2022, 7:13 AM

## 2022-10-23 DIAGNOSIS — Z7901 Long term (current) use of anticoagulants: Secondary | ICD-10-CM | POA: Diagnosis not present

## 2022-10-23 DIAGNOSIS — Z8719 Personal history of other diseases of the digestive system: Secondary | ICD-10-CM | POA: Diagnosis not present

## 2022-10-23 DIAGNOSIS — K5902 Outlet dysfunction constipation: Secondary | ICD-10-CM | POA: Diagnosis not present

## 2022-10-23 DIAGNOSIS — N816 Rectocele: Secondary | ICD-10-CM | POA: Diagnosis not present

## 2022-10-23 DIAGNOSIS — Z86010 Personal history of colonic polyps: Secondary | ICD-10-CM | POA: Diagnosis not present

## 2022-10-23 DIAGNOSIS — G4733 Obstructive sleep apnea (adult) (pediatric): Secondary | ICD-10-CM | POA: Diagnosis not present

## 2022-10-31 ENCOUNTER — Other Ambulatory Visit (HOSPITAL_BASED_OUTPATIENT_CLINIC_OR_DEPARTMENT_OTHER): Payer: Self-pay

## 2022-10-31 DIAGNOSIS — E039 Hypothyroidism, unspecified: Secondary | ICD-10-CM | POA: Diagnosis not present

## 2022-11-05 DIAGNOSIS — G4733 Obstructive sleep apnea (adult) (pediatric): Secondary | ICD-10-CM | POA: Diagnosis not present

## 2022-11-07 DIAGNOSIS — K2100 Gastro-esophageal reflux disease with esophagitis, without bleeding: Secondary | ICD-10-CM | POA: Diagnosis not present

## 2022-11-07 DIAGNOSIS — K648 Other hemorrhoids: Secondary | ICD-10-CM | POA: Diagnosis not present

## 2022-11-07 DIAGNOSIS — K5289 Other specified noninfective gastroenteritis and colitis: Secondary | ICD-10-CM | POA: Diagnosis not present

## 2022-11-07 DIAGNOSIS — K317 Polyp of stomach and duodenum: Secondary | ICD-10-CM | POA: Diagnosis not present

## 2022-11-07 DIAGNOSIS — K6289 Other specified diseases of anus and rectum: Secondary | ICD-10-CM | POA: Diagnosis not present

## 2022-11-07 DIAGNOSIS — D120 Benign neoplasm of cecum: Secondary | ICD-10-CM | POA: Diagnosis not present

## 2022-11-07 DIAGNOSIS — D128 Benign neoplasm of rectum: Secondary | ICD-10-CM | POA: Diagnosis not present

## 2022-11-07 DIAGNOSIS — K2289 Other specified disease of esophagus: Secondary | ICD-10-CM | POA: Diagnosis not present

## 2022-11-07 DIAGNOSIS — Z09 Encounter for follow-up examination after completed treatment for conditions other than malignant neoplasm: Secondary | ICD-10-CM | POA: Diagnosis not present

## 2022-11-07 DIAGNOSIS — K5730 Diverticulosis of large intestine without perforation or abscess without bleeding: Secondary | ICD-10-CM | POA: Diagnosis not present

## 2022-11-07 DIAGNOSIS — K3189 Other diseases of stomach and duodenum: Secondary | ICD-10-CM | POA: Diagnosis not present

## 2022-11-07 DIAGNOSIS — K2930 Chronic superficial gastritis without bleeding: Secondary | ICD-10-CM | POA: Diagnosis not present

## 2022-11-07 DIAGNOSIS — K2270 Barrett's esophagus without dysplasia: Secondary | ICD-10-CM | POA: Diagnosis not present

## 2022-11-07 DIAGNOSIS — Z86010 Personal history of colonic polyps: Secondary | ICD-10-CM | POA: Diagnosis not present

## 2022-11-14 DIAGNOSIS — K5289 Other specified noninfective gastroenteritis and colitis: Secondary | ICD-10-CM | POA: Diagnosis not present

## 2022-11-14 DIAGNOSIS — D120 Benign neoplasm of cecum: Secondary | ICD-10-CM | POA: Diagnosis not present

## 2022-11-14 DIAGNOSIS — K2100 Gastro-esophageal reflux disease with esophagitis, without bleeding: Secondary | ICD-10-CM | POA: Diagnosis not present

## 2022-11-14 DIAGNOSIS — K2930 Chronic superficial gastritis without bleeding: Secondary | ICD-10-CM | POA: Diagnosis not present

## 2022-11-14 DIAGNOSIS — K317 Polyp of stomach and duodenum: Secondary | ICD-10-CM | POA: Diagnosis not present

## 2022-11-20 ENCOUNTER — Telehealth (HOSPITAL_COMMUNITY): Payer: Self-pay | Admitting: *Deleted

## 2022-11-20 NOTE — Telephone Encounter (Signed)
Patient called in stating she went back into afib about 1 hour ago. HR 115 BP 158/111. She was recently reduced on her metoprolol evening dose by her PCP mainly due intermittently lower BP but she states since decreasing the dose her BP has been elevated. She will take metoprolol 50mg  now and her normal dosing in the evening. She will call in AM if still in Afib.

## 2022-11-21 NOTE — Telephone Encounter (Signed)
Pt back in normal rhythm after her dose of metoprolol last night. Feeling back to baseline. Will call if issues arise.

## 2022-11-23 DIAGNOSIS — G4733 Obstructive sleep apnea (adult) (pediatric): Secondary | ICD-10-CM | POA: Diagnosis not present

## 2022-12-12 DIAGNOSIS — K623 Rectal prolapse: Secondary | ICD-10-CM | POA: Diagnosis not present

## 2022-12-12 DIAGNOSIS — R159 Full incontinence of feces: Secondary | ICD-10-CM | POA: Diagnosis not present

## 2022-12-24 DIAGNOSIS — G4733 Obstructive sleep apnea (adult) (pediatric): Secondary | ICD-10-CM | POA: Diagnosis not present

## 2022-12-31 ENCOUNTER — Emergency Department (HOSPITAL_COMMUNITY): Payer: PPO

## 2022-12-31 ENCOUNTER — Inpatient Hospital Stay (HOSPITAL_COMMUNITY)
Admission: EM | Admit: 2022-12-31 | Discharge: 2023-01-03 | DRG: 291 | Disposition: A | Discharge status: Home / Self Care | Payer: PPO | Attending: Internal Medicine | Admitting: Internal Medicine

## 2022-12-31 DIAGNOSIS — F40240 Claustrophobia: Secondary | ICD-10-CM | POA: Diagnosis present

## 2022-12-31 DIAGNOSIS — Z5986 Financial insecurity: Secondary | ICD-10-CM

## 2022-12-31 DIAGNOSIS — Z882 Allergy status to sulfonamides status: Secondary | ICD-10-CM

## 2022-12-31 DIAGNOSIS — Z23 Encounter for immunization: Secondary | ICD-10-CM | POA: Diagnosis not present

## 2022-12-31 DIAGNOSIS — Z880 Allergy status to penicillin: Secondary | ICD-10-CM

## 2022-12-31 DIAGNOSIS — Z825 Family history of asthma and other chronic lower respiratory diseases: Secondary | ICD-10-CM

## 2022-12-31 DIAGNOSIS — Z90710 Acquired absence of both cervix and uterus: Secondary | ICD-10-CM

## 2022-12-31 DIAGNOSIS — I5043 Acute on chronic combined systolic (congestive) and diastolic (congestive) heart failure: Secondary | ICD-10-CM

## 2022-12-31 DIAGNOSIS — Z8249 Family history of ischemic heart disease and other diseases of the circulatory system: Secondary | ICD-10-CM

## 2022-12-31 DIAGNOSIS — N183 Chronic kidney disease, stage 3 unspecified: Secondary | ICD-10-CM | POA: Diagnosis present

## 2022-12-31 DIAGNOSIS — I110 Hypertensive heart disease with heart failure: Secondary | ICD-10-CM | POA: Diagnosis not present

## 2022-12-31 DIAGNOSIS — J4489 Other specified chronic obstructive pulmonary disease: Secondary | ICD-10-CM | POA: Diagnosis present

## 2022-12-31 DIAGNOSIS — Z7951 Long term (current) use of inhaled steroids: Secondary | ICD-10-CM

## 2022-12-31 DIAGNOSIS — Z79899 Other long term (current) drug therapy: Secondary | ICD-10-CM

## 2022-12-31 DIAGNOSIS — Z888 Allergy status to other drugs, medicaments and biological substances status: Secondary | ICD-10-CM

## 2022-12-31 DIAGNOSIS — Z1152 Encounter for screening for COVID-19: Secondary | ICD-10-CM

## 2022-12-31 DIAGNOSIS — I5033 Acute on chronic diastolic (congestive) heart failure: Secondary | ICD-10-CM | POA: Diagnosis present

## 2022-12-31 DIAGNOSIS — Z881 Allergy status to other antibiotic agents status: Secondary | ICD-10-CM

## 2022-12-31 DIAGNOSIS — K2270 Barrett's esophagus without dysplasia: Secondary | ICD-10-CM | POA: Diagnosis present

## 2022-12-31 DIAGNOSIS — Z7901 Long term (current) use of anticoagulants: Secondary | ICD-10-CM

## 2022-12-31 DIAGNOSIS — I10 Essential (primary) hypertension: Secondary | ICD-10-CM | POA: Diagnosis not present

## 2022-12-31 DIAGNOSIS — I509 Heart failure, unspecified: Secondary | ICD-10-CM | POA: Diagnosis not present

## 2022-12-31 DIAGNOSIS — E039 Hypothyroidism, unspecified: Secondary | ICD-10-CM | POA: Diagnosis present

## 2022-12-31 DIAGNOSIS — K219 Gastro-esophageal reflux disease without esophagitis: Secondary | ICD-10-CM | POA: Diagnosis present

## 2022-12-31 DIAGNOSIS — G4733 Obstructive sleep apnea (adult) (pediatric): Secondary | ICD-10-CM | POA: Diagnosis present

## 2022-12-31 DIAGNOSIS — I2510 Atherosclerotic heart disease of native coronary artery without angina pectoris: Secondary | ICD-10-CM | POA: Diagnosis present

## 2022-12-31 DIAGNOSIS — Z6841 Body Mass Index (BMI) 40.0 and over, adult: Secondary | ICD-10-CM

## 2022-12-31 DIAGNOSIS — R0602 Shortness of breath: Secondary | ICD-10-CM | POA: Diagnosis not present

## 2022-12-31 DIAGNOSIS — J45909 Unspecified asthma, uncomplicated: Secondary | ICD-10-CM | POA: Diagnosis present

## 2022-12-31 DIAGNOSIS — I080 Rheumatic disorders of both mitral and aortic valves: Secondary | ICD-10-CM | POA: Diagnosis present

## 2022-12-31 DIAGNOSIS — Z79890 Hormone replacement therapy (postmenopausal): Secondary | ICD-10-CM

## 2022-12-31 DIAGNOSIS — Z885 Allergy status to narcotic agent status: Secondary | ICD-10-CM

## 2022-12-31 DIAGNOSIS — I130 Hypertensive heart and chronic kidney disease with heart failure and stage 1 through stage 4 chronic kidney disease, or unspecified chronic kidney disease: Secondary | ICD-10-CM | POA: Diagnosis not present

## 2022-12-31 DIAGNOSIS — Z87891 Personal history of nicotine dependence: Secondary | ICD-10-CM

## 2022-12-31 DIAGNOSIS — J9621 Acute and chronic respiratory failure with hypoxia: Secondary | ICD-10-CM | POA: Diagnosis not present

## 2022-12-31 DIAGNOSIS — R0902 Hypoxemia: Secondary | ICD-10-CM | POA: Diagnosis not present

## 2022-12-31 DIAGNOSIS — N1831 Chronic kidney disease, stage 3a: Secondary | ICD-10-CM | POA: Diagnosis present

## 2022-12-31 DIAGNOSIS — J9601 Acute respiratory failure with hypoxia: Secondary | ICD-10-CM | POA: Insufficient documentation

## 2022-12-31 DIAGNOSIS — I5181 Takotsubo syndrome: Secondary | ICD-10-CM | POA: Diagnosis present

## 2022-12-31 DIAGNOSIS — E785 Hyperlipidemia, unspecified: Secondary | ICD-10-CM | POA: Diagnosis present

## 2022-12-31 DIAGNOSIS — R0789 Other chest pain: Secondary | ICD-10-CM | POA: Diagnosis not present

## 2022-12-31 DIAGNOSIS — R079 Chest pain, unspecified: Secondary | ICD-10-CM | POA: Diagnosis not present

## 2022-12-31 DIAGNOSIS — I4819 Other persistent atrial fibrillation: Secondary | ICD-10-CM | POA: Diagnosis present

## 2022-12-31 DIAGNOSIS — I517 Cardiomegaly: Secondary | ICD-10-CM | POA: Diagnosis not present

## 2022-12-31 LAB — COMPREHENSIVE METABOLIC PANEL
ALT: 17 U/L (ref 0–44)
AST: 18 U/L (ref 15–41)
Albumin: 3.5 g/dL (ref 3.5–5.0)
Alkaline Phosphatase: 95 U/L (ref 38–126)
Anion gap: 8 (ref 5–15)
BUN: 21 mg/dL (ref 8–23)
CO2: 23 mmol/L (ref 22–32)
Calcium: 8.1 mg/dL — ABNORMAL LOW (ref 8.9–10.3)
Chloride: 109 mmol/L (ref 98–111)
Creatinine, Ser: 1.22 mg/dL — ABNORMAL HIGH (ref 0.44–1.00)
GFR, Estimated: 46 mL/min — ABNORMAL LOW (ref >60)
Glucose, Bld: 125 mg/dL — ABNORMAL HIGH (ref 70–99)
Potassium: 4.4 mmol/L (ref 3.5–5.1)
Sodium: 140 mmol/L (ref 135–145)
Total Bilirubin: 0.5 mg/dL (ref 0.3–1.2)
Total Protein: 6.8 g/dL (ref 6.5–8.1)

## 2022-12-31 LAB — CBC
HCT: 34.1 % — ABNORMAL LOW (ref 36.0–46.0)
Hemoglobin: 9.8 g/dL — ABNORMAL LOW (ref 12.0–15.0)
MCH: 23.2 pg — ABNORMAL LOW (ref 26.0–34.0)
MCHC: 28.7 g/dL — ABNORMAL LOW (ref 30.0–36.0)
MCV: 80.6 fL (ref 80.0–100.0)
Platelets: 254 10*3/uL (ref 150–400)
RBC: 4.23 MIL/uL (ref 3.87–5.11)
RDW: 16.1 % — ABNORMAL HIGH (ref 11.5–15.5)
WBC: 12 10*3/uL — ABNORMAL HIGH (ref 4.0–10.5)
nRBC: 0 % (ref 0.0–0.2)

## 2022-12-31 LAB — BRAIN NATRIURETIC PEPTIDE: B Natriuretic Peptide: 833.3 pg/mL — ABNORMAL HIGH (ref 0.0–100.0)

## 2022-12-31 LAB — TROPONIN I (HIGH SENSITIVITY): Troponin I (High Sensitivity): 6 ng/L (ref <18)

## 2022-12-31 MED ORDER — FUROSEMIDE 10 MG/ML IJ SOLN
40.0000 mg | Freq: Once | INTRAMUSCULAR | Status: AC
  Administered 2022-12-31: 40 mg via INTRAVENOUS
  Filled 2022-12-31: qty 4

## 2022-12-31 NOTE — ED Triage Notes (Signed)
Pt BIB EMS from home. C/o SHOB and CP starting at 6pm, sitting on couch when it started.  Hx chf. Pt reports similar episodes previously.   EMS admin 0.4mg  nitroglycerin  EMS VS: 96% 15L 02 nonrebreather, 86% on RA, 175/106, HR 97

## 2022-12-31 NOTE — ED Provider Notes (Signed)
[place] EMERGENCY DEPARTMENT AT [facility] Provider Note   CSN: [id number] Arrival date & time: 12/31/22  2159     History  Chief Complaint  Patient presents with   Shortness of Breath   Chest Pain    Leslie [name] is a 77 y.o. female.  Patient with history of hypertension, morbid obesity, CKD, afib on Elliqius, CHF presents today with complaints of shortness of breath. She states that same began at 6 pm this evening. She states this feels similar to when she has had to be admitted for CHF exacerbations previously. She does note that she had family in from out of town over the weekend and had more unhealthy foods than she normally does. She is up 2 lbs from her dry weight. She is not on Lasix daily, states that she has it as a rescue medication that she is supposed to take if she feels fluid overloaded. She states that her symptoms came on so quickly that she did not have time to take any Lasix before calling EMS for transport. She is not on oxygen normally. EMS arrived and found her satting in the 80s on room air with increased work of breathing, gave nitroglycerin and placed on 15L O2 via nonrebreather. Patient does note some chest tightness, denies diaphoresis, nausea, vomiting, or abdominal pain. No leg pain.  Of note, patient does mention that she is not able to tolerate BiPAP due to severe claustrophobia.  The history is provided by the patient. No language interpreter was used.  Shortness of Breath Associated symptoms: chest pain   Chest Pain Associated symptoms: shortness of breath        Home Medications Prior to Admission medications   Medication Sig Start Date End Date Taking? Authorizing Provider  acetaminophen (TYLENOL) 650 MG CR tablet Take 1,300 mg by mouth every 8 (eight) hours as needed for pain.    [provider]  amiodarone (PACERONE) 200 MG tablet Take 1 tablet (200 mg total) by mouth daily. 04/18/22   Joylene Grapes, NP  apixaban  (ELIQUIS) 5 MG TABS tablet Take 1 tablet (5 mg total) by mouth 2 (two) times daily. 04/18/22   Joylene Grapes, NP  ARNUITY ELLIPTA 50 MCG/ACT AEPB Inhale into the lungs. 04/26/22   [provider]  atorvastatin (LIPITOR) 40 MG tablet Take 1 tablet (40 mg total) by mouth daily. 04/18/22   Joylene Grapes, NP  Bacillus Coagulans-Inulin (ALIGN PREBIOTIC-PROBIOTIC PO) Take 2 capsules by mouth daily. Gummy    [provider]  cholecalciferol (VITAMIN D) 1000 UNITS tablet Take 1,000 Units by mouth daily.    [provider]  diclofenac Sodium (VOLTAREN) 1 % GEL Apply 1 application topically 2 (two) times daily.    [provider]  fexofenadine (ALLEGRA) 180 MG tablet Take 180 mg by mouth daily.    [provider]  fluticasone (FLONASE) 50 MCG/ACT nasal spray Place 2 sprays into both nostrils at bedtime.     [provider]  furosemide (LASIX) 40 MG tablet Take 1 tablet (40 mg total) by mouth as needed. For weight greater than 261 lbs 04/30/22   Clegg, Amy D, NP  Glucosamine-Chondroitin (OSTEO BI-FLEX REGULAR STRENGTH PO) Take 1 tablet by mouth daily.    [provider]  levothyroxine (SYNTHROID, LEVOTHROID) 50 MCG tablet Take 50 mcg by mouth daily before breakfast.    [provider]  magnesium oxide (MAG-OX) 400 (240 Mg) MG tablet Take 1 tablet (400 mg total)  by mouth daily. 06/03/22   Croitoru, Mihai, MD  metoprolol succinate (TOPROL-XL) 100 MG 24 hr tablet Take 50mg  in the morning and 100mg  in the evening 07/30/22   Croitoru, Mihai, MD  metroNIDAZOLE (METROCREAM) 0.75 % cream Apply 1 application  topically daily as needed. 05/16/20   [provider]  mometasone (ELOCON) 0.1 % cream Apply 1 application topically 2 (two) times daily as needed (skin irritation). Patient not taking: Reported on 07/30/2022 05/18/20   [provider]  Multiple Vitamins-Minerals (CENTRUM MULTIGUMMIES) CHEW Chew 1 tablet by mouth daily.    [provider]  omeprazole (PRILOSEC) 20 MG capsule Take 40 mg by mouth daily before breakfast. 07/27/16   [provider]  sacubitril-valsartan (ENTRESTO) 49-51 MG Take 1 tablet by mouth 2 (two) times daily. 04/30/22   Clegg, Amy D, NP  senna (SENOKOT) 8.6 MG tablet Take 1 tablet by mouth every evening.    [provider]      Allergies    Clindamycin hcl, Augmentin [amoxicillin-pot clavulanate], Codeine, Demerol [meperidine], and Sulfa antibiotics    Review of Systems   Review of Systems  Respiratory:  Positive for shortness of breath.   Cardiovascular:  Positive for chest pain.  All other systems reviewed and are negative.   Physical Exam Updated Vital Signs BP (!) 158/83   Pulse 95   Temp 97.7 F (36.5 C) (Oral)   Resp (!) 23   SpO2 100%  Physical Exam Vitals and nursing note reviewed.  Constitutional:      General: She is not in acute distress.    Appearance: Normal appearance. She is normal weight. She is not ill-appearing, toxic-appearing or diaphoretic.  HENT:     Head: Normocephalic and atraumatic.  Cardiovascular:     Rate and Rhythm: Normal rate and regular rhythm.     Heart sounds: Normal heart sounds.  Pulmonary:     Effort: Tachypnea present. No respiratory distress.  Abdominal:     Palpations: Abdomen is soft.     Tenderness: There is no abdominal tenderness.  Musculoskeletal:        General: Normal range of motion.     Cervical back: Normal range of motion.     Comments: Trace edema present bilaterally  Skin:    General: Skin is warm and dry.  Neurological:     General: No focal deficit present.     Mental Status: She is alert.  Psychiatric:        Mood and Affect: Mood normal.        Behavior: Behavior normal.     ED Results / Procedures / Treatments   Labs (all labs ordered are listed, but only abnormal results are displayed) Labs Reviewed  CBC - Abnormal; Notable for the following components:      Result Value   WBC 12.0  (*)    Hemoglobin 9.8 (*)    HCT 34.1 (*)    MCH 23.2 (*)    MCHC 28.7 (*)    RDW 16.1 (*)    All other components within normal limits  COMPREHENSIVE METABOLIC PANEL - Abnormal; Notable for the following components:   Glucose, Bld 125 (*)    Creatinine, Ser 1.22 (*)    Calcium 8.1 (*)    GFR, Estimated 46 (*)    All other components within normal limits  BRAIN NATRIURETIC PEPTIDE - Abnormal; Notable for the following components:   B Natriuretic Peptide 833.3 (*)    All other components within normal  limits  RESP PANEL BY RT-PCR (RSV, FLU A&B, COVID)  RVPGX2  TROPONIN I (HIGH SENSITIVITY)  TROPONIN I (HIGH SENSITIVITY)    EKG EKG Interpretation Date/Time:  Tuesday December 31 2022 22:20:40 EDT Ventricular Rate:  96 PR Interval:  101 QRS Duration:  97 QT Interval:  384 QTC Calculation: 486 R Axis:   98  Text Interpretation: Sinus or ectopic atrial rhythm Short PR interval Right axis deviation Low voltage, precordial leads Borderline prolonged QT interval Confirmed by Zadie Rhine ([id number]) on 12/31/2022 11:10:45 PM  Radiology DG Chest 2 View  Result Date: 12/31/2022 CLINICAL DATA:  Chest pain and shortness of breath.  History of CHF EXAM: CHEST - 2 VIEW COMPARISON:  Radiographs 04/09/2022 FINDINGS: Stable cardiomegaly. Bilateral interstitial opacities and hazy opacities greatest in the lower lungs. No definite pleural effusion. No pneumothorax. IMPRESSION: Findings compatible with CHF. Electronically Signed   By: Minerva Fester M.D.   On: 12/31/2022 22:58    Procedures .Critical Care  Performed by: Silva Bandy, PA-C Authorized by: Silva Bandy, PA-C   Critical care provider statement:    Critical care time (minutes):  30   Critical care was necessary to treat or prevent imminent or life-threatening deterioration of the following conditions:  Respiratory failure, circulatory failure and cardiac failure   Critical care was time spent personally by me on the  following activities:  Development of treatment plan with patient or surrogate, discussions with primary provider, evaluation of patient's response to treatment, examination of patient, interpretation of cardiac output measurements, obtaining history from patient or surrogate, ordering and review of laboratory studies, ordering and review of radiographic studies, pulse oximetry, re-evaluation of patient's condition and review of old charts   Care discussed with: admitting provider       Medications Ordered in ED Medications  furosemide (LASIX) injection 40 mg (40 mg Intravenous Given 12/31/22 2259)    ED Course/ Medical Decision Making/ A&P                                 Medical Decision Making Amount and/or Complexity of Data Reviewed Labs: ordered. Radiology: ordered.  Risk Prescription drug management. Decision regarding hospitalization.   This patient is a 77 y.o. female who presents to the ED for concern of shortness of breath, this involves an extensive number of treatment options, and is a complaint that carries with it a high risk of complications and morbidity. The emergent differential diagnosis prior to evaluation includes, but is not limited to,  CHF, pericardial effusion/tamponade, arrhythmias, ACS, COPD, asthma, bronchitis, pneumonia, pneumothorax, PE, anemia   This is not an exhaustive differential.   Past Medical History / Co-morbidities / Social History:  has a past medical history of Adenomatous polyp, Arthritis, Asthma, Bursitis of left shoulder, Chest pain, CHF (congestive heart failure) (HCC), Chronic bronchitis (HCC), Chronic kidney disease, GERD (gastroesophageal reflux disease), History of hiatal hernia, Hyperlipidemia, Hypertension, Hypothyroidism, Migraines, Mitral regurgitation, Moderate aortic insufficiency, Obesity, OSA on CPAP, PAF (paroxysmal atrial fibrillation) (HCC), PONV (postoperative nausea and vomiting), and Vitamin D deficiency.   Additional  history: Chart reviewed. Pertinent results include: past admissions for CHF exacerbation previously. Last echo showed EF 65-70% in 2/24. Per chart review, patient unable to tolerate bipap due to claustrophobia.  Physical Exam: Physical exam performed. The pertinent findings include: increased work of breathing, on 15 L via nonrebreather  Lab Tests: I ordered, and personally interpreted labs.  The pertinent  results include:  WBC 12.0, kidney function at baseline. BNP 833.3. Troponin WNL   Imaging Studies: I ordered imaging studies including CXR. I independently visualized and interpreted imaging which showed findings compatible with CHF. I agree with the radiologist interpretation.   Cardiac Monitoring:  The patient was maintained on a cardiac monitor.  My attending physician Dr. Bebe Shaggy viewed and interpreted the cardiac monitored which showed an underlying rhythm of: no STEMI. I agree with this interpretation.   Medications: I ordered medication including Lasix  for CHF exacerbation. Reevaluation of the patient after these medicines showed that the patient improved. I have reviewed the patients home medicines and have made adjustments as needed.    Disposition: After consideration of the diagnostic results and the patients response to treatment, I feel that patient will require admission for CHF exacerbation.  After Lasix infusion, patient had good output and is feeling some better.  She is now on 4 L of oxygen via nasal cannula. Discussed plan with patient who is understanding and in agreement with plan.   Discussed patient with hospitalist who accepts patient for admission.  I discussed this case with my attending physician Dr. Bebe Shaggy who cosigned this note including patient's presenting symptoms, physical exam, and planned diagnostics and interventions. Attending physician stated agreement with plan or made changes to plan which were implemented.    Final Clinical Impression(s) / ED  Diagnoses Final diagnoses:  Acute and chronic respiratory failure with hypoxia (HCC)  Acute on chronic congestive heart failure, unspecified heart failure type [facility])    Rx / DC Orders ED Discharge Orders     None         Vear Clock 01/01/23 8295    Zadie Rhine, MD 01/01/23 [phone]

## 2022-12-31 NOTE — ED Provider Notes (Incomplete)
[place] EMERGENCY DEPARTMENT AT [facility] Provider Note   CSN: [id number] Arrival date & time: 12/31/22  2159     History {Add pertinent medical, surgical, social history, OB history to HPI:1} Chief Complaint  Patient presents with  . Shortness of Breath  . Chest Pain    Leslie [name] is a 77 y.o. female.  Patient with history of hypertension, morbid obesity, CKD, afib on Elliqius, CHF presents today with complaints of shortness of breath. She states that same began at 6 pm this evening    Shortness of Breath Associated symptoms: chest pain   Chest Pain Associated symptoms: shortness of breath        Home Medications Prior to Admission medications   Medication Sig Start Date End Date Taking? Authorizing Provider  acetaminophen (TYLENOL) 650 MG CR tablet Take 1,300 mg by mouth every 8 (eight) hours as needed for pain.    [provider]  amiodarone (PACERONE) 200 MG tablet Take 1 tablet (200 mg total) by mouth daily. 04/18/22   Joylene Grapes, NP  apixaban (ELIQUIS) 5 MG TABS tablet Take 1 tablet (5 mg total) by mouth 2 (two) times daily. 04/18/22   Joylene Grapes, NP  ARNUITY ELLIPTA 50 MCG/ACT AEPB Inhale into the lungs. 04/26/22   [provider]  atorvastatin (LIPITOR) 40 MG tablet Take 1 tablet (40 mg total) by mouth daily. 04/18/22   Joylene Grapes, NP  Bacillus Coagulans-Inulin (ALIGN PREBIOTIC-PROBIOTIC PO) Take 2 capsules by mouth daily. Gummy    [provider]  cholecalciferol (VITAMIN D) 1000 UNITS tablet Take 1,000 Units by mouth daily.    [provider]  diclofenac Sodium (VOLTAREN) 1 % GEL Apply 1 application topically 2 (two) times daily.    [provider]  fexofenadine (ALLEGRA) 180 MG tablet Take 180 mg by mouth daily.    [provider]  fluticasone (FLONASE) 50 MCG/ACT nasal spray Place 2 sprays into both nostrils at bedtime.     [provider]  furosemide (LASIX) 40 MG tablet Take  1 tablet (40 mg total) by mouth as needed. For weight greater than 261 lbs 04/30/22   Clegg, Amy D, NP  Glucosamine-Chondroitin (OSTEO BI-FLEX REGULAR STRENGTH PO) Take 1 tablet by mouth daily.    [provider]  levothyroxine (SYNTHROID, LEVOTHROID) 50 MCG tablet Take 50 mcg by mouth daily before breakfast.    [provider]  magnesium oxide (MAG-OX) 400 (240 Mg) MG tablet Take 1 tablet (400 mg total) by mouth daily. 06/03/22   Croitoru, Mihai, MD  metoprolol succinate (TOPROL-XL) 100 MG 24 hr tablet Take 50mg  in the morning and 100mg  in the evening 07/30/22   Croitoru, Mihai, MD  metroNIDAZOLE (METROCREAM) 0.75 % cream Apply 1 application  topically daily as needed. 05/16/20   [provider]  mometasone (ELOCON) 0.1 % cream Apply 1 application topically 2 (two) times daily as needed (skin irritation). Patient not taking: Reported on 07/30/2022 05/18/20   [provider]  Multiple Vitamins-Minerals (CENTRUM MULTIGUMMIES) CHEW Chew 1 tablet by mouth daily.    [provider]  omeprazole (PRILOSEC) 20 MG capsule Take 40 mg by mouth daily before breakfast. 07/27/16   [provider]  sacubitril-valsartan (ENTRESTO) 49-51 MG Take 1 tablet by mouth 2 (two) times daily. 04/30/22   Clegg, Amy D, NP  senna (SENOKOT) 8.6 MG tablet Take 1 tablet by mouth every evening.    [provider]      Allergies  Clindamycin hcl, Augmentin [amoxicillin-pot clavulanate], Codeine, Demerol [meperidine], and Sulfa antibiotics    Review of Systems   Review of Systems  Respiratory:  Positive for shortness of breath.   Cardiovascular:  Positive for chest pain.    Physical Exam Updated Vital Signs BP (!) 158/83   Pulse 95   Temp 97.7 F (36.5 C) (Oral)   Resp (!) 23   SpO2 100%  Physical Exam  ED Results / Procedures / Treatments   Labs (all labs ordered are listed, but only abnormal results are displayed) Labs Reviewed  CBC - Abnormal; Notable for  the following components:      Result Value   WBC 12.0 (*)    Hemoglobin 9.8 (*)    HCT 34.1 (*)    MCH 23.2 (*)    MCHC 28.7 (*)    RDW 16.1 (*)    All other components within normal limits  RESP PANEL BY RT-PCR (RSV, FLU A&B, COVID)  [id number]  COMPREHENSIVE METABOLIC PANEL  BRAIN NATRIURETIC PEPTIDE  TROPONIN I (HIGH SENSITIVITY)    EKG None  Radiology DG Chest 2 View  Result Date: 12/31/2022 CLINICAL DATA:  Chest pain and shortness of breath.  History of CHF EXAM: CHEST - 2 VIEW COMPARISON:  Radiographs 04/09/2022 FINDINGS: Stable cardiomegaly. Bilateral interstitial opacities and hazy opacities greatest in the lower lungs. No definite pleural effusion. No pneumothorax. IMPRESSION: Findings compatible with CHF. Electronically Signed   By: Minerva Fester M.D.   On: 12/31/2022 22:58    Procedures Procedures  {Document cardiac monitor, telemetry assessment procedure when appropriate:1}  Medications Ordered in ED Medications  furosemide (LASIX) injection 40 mg (40 mg Intravenous Given 12/31/22 2259)    ED Course/ Medical Decision Making/ A&P   {   Click here for ABCD2, HEART and other calculatorsREFRESH Note before signing :1}                              Medical Decision Making Amount and/or Complexity of Data Reviewed Labs: ordered. Radiology: ordered.  Risk Prescription drug management.   ***  {Document critical care time when appropriate:1} {Document review of labs and clinical decision tools ie heart score, Chads2Vasc2 etc:1}  {Document your independent review of radiology images, and any outside records:1} {Document your discussion with family members, caretakers, and with consultants:1} {Document social determinants of health affecting pt's care:1} {Document your decision making why or why not admission, treatments were needed:1} Final Clinical Impression(s) / ED Diagnoses Final diagnoses:  None    Rx / DC Orders ED Discharge Orders     None

## 2023-01-01 ENCOUNTER — Other Ambulatory Visit: Payer: Self-pay

## 2023-01-01 DIAGNOSIS — Z79890 Hormone replacement therapy (postmenopausal): Secondary | ICD-10-CM | POA: Diagnosis not present

## 2023-01-01 DIAGNOSIS — I2510 Atherosclerotic heart disease of native coronary artery without angina pectoris: Secondary | ICD-10-CM | POA: Diagnosis not present

## 2023-01-01 DIAGNOSIS — I5033 Acute on chronic diastolic (congestive) heart failure: Secondary | ICD-10-CM | POA: Diagnosis not present

## 2023-01-01 DIAGNOSIS — K2270 Barrett's esophagus without dysplasia: Secondary | ICD-10-CM | POA: Diagnosis not present

## 2023-01-01 DIAGNOSIS — F40240 Claustrophobia: Secondary | ICD-10-CM | POA: Diagnosis not present

## 2023-01-01 DIAGNOSIS — E785 Hyperlipidemia, unspecified: Secondary | ICD-10-CM | POA: Diagnosis not present

## 2023-01-01 DIAGNOSIS — K219 Gastro-esophageal reflux disease without esophagitis: Secondary | ICD-10-CM | POA: Diagnosis not present

## 2023-01-01 DIAGNOSIS — N1831 Chronic kidney disease, stage 3a: Secondary | ICD-10-CM | POA: Diagnosis not present

## 2023-01-01 DIAGNOSIS — J4489 Other specified chronic obstructive pulmonary disease: Secondary | ICD-10-CM | POA: Diagnosis not present

## 2023-01-01 DIAGNOSIS — I509 Heart failure, unspecified: Secondary | ICD-10-CM | POA: Diagnosis present

## 2023-01-01 DIAGNOSIS — Z23 Encounter for immunization: Secondary | ICD-10-CM | POA: Diagnosis not present

## 2023-01-01 DIAGNOSIS — I080 Rheumatic disorders of both mitral and aortic valves: Secondary | ICD-10-CM | POA: Diagnosis not present

## 2023-01-01 DIAGNOSIS — Z5986 Financial insecurity: Secondary | ICD-10-CM | POA: Diagnosis not present

## 2023-01-01 DIAGNOSIS — Z1152 Encounter for screening for COVID-19: Secondary | ICD-10-CM | POA: Diagnosis not present

## 2023-01-01 DIAGNOSIS — I4819 Other persistent atrial fibrillation: Secondary | ICD-10-CM | POA: Diagnosis not present

## 2023-01-01 DIAGNOSIS — J9621 Acute and chronic respiratory failure with hypoxia: Secondary | ICD-10-CM

## 2023-01-01 DIAGNOSIS — Z87891 Personal history of nicotine dependence: Secondary | ICD-10-CM | POA: Diagnosis not present

## 2023-01-01 DIAGNOSIS — I5181 Takotsubo syndrome: Secondary | ICD-10-CM | POA: Diagnosis not present

## 2023-01-01 DIAGNOSIS — Z6841 Body Mass Index (BMI) 40.0 and over, adult: Secondary | ICD-10-CM | POA: Diagnosis not present

## 2023-01-01 DIAGNOSIS — J9601 Acute respiratory failure with hypoxia: Secondary | ICD-10-CM | POA: Insufficient documentation

## 2023-01-01 DIAGNOSIS — E039 Hypothyroidism, unspecified: Secondary | ICD-10-CM | POA: Diagnosis not present

## 2023-01-01 DIAGNOSIS — Z7901 Long term (current) use of anticoagulants: Secondary | ICD-10-CM | POA: Diagnosis not present

## 2023-01-01 DIAGNOSIS — Z79899 Other long term (current) drug therapy: Secondary | ICD-10-CM | POA: Diagnosis not present

## 2023-01-01 DIAGNOSIS — I130 Hypertensive heart and chronic kidney disease with heart failure and stage 1 through stage 4 chronic kidney disease, or unspecified chronic kidney disease: Secondary | ICD-10-CM | POA: Diagnosis not present

## 2023-01-01 DIAGNOSIS — G4733 Obstructive sleep apnea (adult) (pediatric): Secondary | ICD-10-CM | POA: Diagnosis not present

## 2023-01-01 LAB — CBC
HCT: 32.7 % — ABNORMAL LOW (ref 36.0–46.0)
Hemoglobin: 9.6 g/dL — ABNORMAL LOW (ref 12.0–15.0)
MCH: 23.2 pg — ABNORMAL LOW (ref 26.0–34.0)
MCHC: 29.4 g/dL — ABNORMAL LOW (ref 30.0–36.0)
MCV: 79 fL — ABNORMAL LOW (ref 80.0–100.0)
Platelets: 263 10*3/uL (ref 150–400)
RBC: 4.14 MIL/uL (ref 3.87–5.11)
RDW: 16.2 % — ABNORMAL HIGH (ref 11.5–15.5)
WBC: 8.9 10*3/uL (ref 4.0–10.5)
nRBC: 0 % (ref 0.0–0.2)

## 2023-01-01 LAB — BASIC METABOLIC PANEL WITH GFR
Anion gap: 10 (ref 5–15)
BUN: 18 mg/dL (ref 8–23)
CO2: 24 mmol/L (ref 22–32)
Calcium: 8.2 mg/dL — ABNORMAL LOW (ref 8.9–10.3)
Chloride: 106 mmol/L (ref 98–111)
Creatinine, Ser: 1.21 mg/dL — ABNORMAL HIGH (ref 0.44–1.00)
GFR, Estimated: 46 mL/min — ABNORMAL LOW (ref >60)
Glucose, Bld: 106 mg/dL — ABNORMAL HIGH (ref 70–99)
Potassium: 4 mmol/L (ref 3.5–5.1)
Sodium: 140 mmol/L (ref 135–145)

## 2023-01-01 MED ORDER — FLUTICASONE PROPIONATE 50 MCG/ACT NA SUSP
2.0000 | Freq: Every day | NASAL | Status: DC
  Administered 2023-01-01 – 2023-01-02 (×2): 2 via NASAL
  Filled 2023-01-01: qty 16

## 2023-01-01 MED ORDER — POLYETHYLENE GLYCOL 3350 17 G PO PACK: 17.0000 g | PACK | Freq: Every day | ORAL | Status: DC | PRN

## 2023-01-01 MED ORDER — ONDANSETRON HCL 4 MG/2ML IJ SOLN: 4.0000 mg | Freq: Four times a day (QID) | INTRAMUSCULAR | Status: DC | PRN

## 2023-01-01 MED ORDER — ONDANSETRON HCL 4 MG PO TABS: 4.0000 mg | ORAL_TABLET | Freq: Four times a day (QID) | ORAL | Status: DC | PRN

## 2023-01-01 MED ORDER — AMIODARONE HCL 200 MG PO TABS
200.0000 mg | ORAL_TABLET | Freq: Every day | ORAL | Status: DC
  Administered 2023-01-01 – 2023-01-03 (×3): 200 mg via ORAL
  Filled 2023-01-01 (×3): qty 1

## 2023-01-01 MED ORDER — BUDESONIDE 0.25 MG/2ML IN SUSP
0.2500 mg | Freq: Two times a day (BID) | RESPIRATORY_TRACT | Status: DC
  Administered 2023-01-01 – 2023-01-03 (×5): 0.25 mg via RESPIRATORY_TRACT
  Filled 2023-01-01 (×5): qty 2

## 2023-01-01 MED ORDER — FUROSEMIDE 10 MG/ML IJ SOLN
40.0000 mg | Freq: Two times a day (BID) | INTRAMUSCULAR | Status: DC
  Administered 2023-01-01 – 2023-01-03 (×5): 40 mg via INTRAVENOUS
  Filled 2023-01-01 (×5): qty 4

## 2023-01-01 MED ORDER — SODIUM CHLORIDE 0.9 % IV SOLN: 250.0000 mL | INTRAVENOUS | Status: DC | PRN

## 2023-01-01 MED ORDER — ACETAMINOPHEN 325 MG PO TABS
650.0000 mg | ORAL_TABLET | Freq: Four times a day (QID) | ORAL | Status: DC | PRN
  Administered 2023-01-01 – 2023-01-03 (×5): 650 mg via ORAL
  Filled 2023-01-01 (×5): qty 2

## 2023-01-01 MED ORDER — SODIUM CHLORIDE 0.9% FLUSH
3.0000 mL | Freq: Two times a day (BID) | INTRAVENOUS | Status: DC
  Administered 2023-01-01 – 2023-01-03 (×4): 3 mL via INTRAVENOUS

## 2023-01-01 MED ORDER — SACUBITRIL-VALSARTAN 49-51 MG PO TABS
1.0000 | ORAL_TABLET | Freq: Two times a day (BID) | ORAL | Status: DC
  Administered 2023-01-01: 1 via ORAL
  Filled 2023-01-01: qty 1

## 2023-01-01 MED ORDER — LEVOTHYROXINE SODIUM 50 MCG PO TABS
50.0000 ug | ORAL_TABLET | Freq: Every day | ORAL | Status: DC
  Administered 2023-01-01 – 2023-01-03 (×3): 50 ug via ORAL
  Filled 2023-01-01 (×2): qty 1
  Filled 2023-01-01: qty 2

## 2023-01-01 MED ORDER — HYDRALAZINE HCL 20 MG/ML IJ SOLN
10.0000 mg | Freq: Four times a day (QID) | INTRAMUSCULAR | Status: DC | PRN
  Filled 2023-01-01: qty 1

## 2023-01-01 MED ORDER — METOPROLOL SUCCINATE ER 50 MG PO TB24
50.0000 mg | ORAL_TABLET | Freq: Every day | ORAL | Status: DC
  Administered 2023-01-01 – 2023-01-03 (×3): 50 mg via ORAL
  Filled 2023-01-01 (×2): qty 1
  Filled 2023-01-01: qty 2

## 2023-01-01 MED ORDER — ATORVASTATIN CALCIUM 40 MG PO TABS
40.0000 mg | ORAL_TABLET | Freq: Every day | ORAL | Status: DC
  Administered 2023-01-01 – 2023-01-03 (×3): 40 mg via ORAL
  Filled 2023-01-01 (×3): qty 1

## 2023-01-01 MED ORDER — APIXABAN 5 MG PO TABS
5.0000 mg | ORAL_TABLET | Freq: Two times a day (BID) | ORAL | Status: DC
  Administered 2023-01-01 – 2023-01-03 (×5): 5 mg via ORAL
  Filled 2023-01-01 (×5): qty 1

## 2023-01-01 MED ORDER — HYDROCODONE-ACETAMINOPHEN 5-325 MG PO TABS: 1.0000 | ORAL_TABLET | ORAL | Status: DC | PRN

## 2023-01-01 MED ORDER — SODIUM CHLORIDE 0.9% FLUSH: 3.0000 mL | INTRAVENOUS | Status: DC | PRN

## 2023-01-01 MED ORDER — ACETAMINOPHEN 500 MG PO TABS: 1000.0000 mg | ORAL_TABLET | Freq: Four times a day (QID) | ORAL | Status: DC | PRN

## 2023-01-01 MED ORDER — PANTOPRAZOLE SODIUM 40 MG PO TBEC
40.0000 mg | DELAYED_RELEASE_TABLET | Freq: Every day | ORAL | Status: DC
  Administered 2023-01-01 – 2023-01-03 (×3): 40 mg via ORAL
  Filled 2023-01-01 (×3): qty 1

## 2023-01-01 MED ORDER — ALBUTEROL SULFATE (2.5 MG/3ML) 0.083% IN NEBU: 2.5000 mg | INHALATION_SOLUTION | RESPIRATORY_TRACT | Status: DC | PRN

## 2023-01-01 MED ORDER — LORATADINE 10 MG PO TABS
10.0000 mg | ORAL_TABLET | Freq: Every day | ORAL | Status: DC
  Administered 2023-01-01 – 2023-01-03 (×3): 10 mg via ORAL
  Filled 2023-01-01 (×3): qty 1

## 2023-01-01 NOTE — ED Notes (Signed)
ED TO INPATIENT HANDOFF REPORT  ED Nurse Name and Phone #:  Theophilus Bones [phone]  S Name/Age/Gender Leslie [name] 77 y.o. female Room/Bed: 020C/020C  Code Status   Code Status: Prior  Home/SNF/Other Home Patient oriented to: self, place, time, and situation Is this baseline? Yes   Triage Complete: Triage complete  Chief Complaint [facility]  Triage Note Pt BIB EMS from home. C/o SHOB and CP starting at 6pm, sitting on couch when it started.  Hx chf. Pt reports similar episodes previously.   EMS admin 0.4mg  nitroglycerin  EMS VS: 96% 15L 02 nonrebreather, 86% on RA, 175/106, HR 97   Allergies Allergies  Allergen Reactions   Clindamycin Hcl     Lip and face swelling   Augmentin [Amoxicillin-Pot Clavulanate] Diarrhea    Did it involve swelling of the face/tongue/throat, SOB, or low BP? No Did it involve sudden or severe rash/hives, skin peeling, or any reaction on the inside of your mouth or nose? No Did you need to seek medical attention at a hospital or doctor's office? No When did it last happen?      10 + years If all above answers are "NO", may proceed with cephalosporin use.    Codeine Nausea Only and Other (See Comments)    Hyperactivity   Demerol [Meperidine] Nausea And Vomiting   Sulfa Antibiotics Rash    Level of Care/Admitting Diagnosis ED Disposition     ED Disposition  Admit   Condition  --   Comment  The patient appears reasonably stabilized for admission considering the current resources, flow, and capabilities available in the ED at this time, and I doubt any other [facility] requiring further screening and/or treatment in the ED prior to admission is  present.          B Medical/Surgery History Past Medical History:  Diagnosis Date   Adenomatous polyp    Arthritis    "knees, legs, fingers" (07/30/2016)   Asthma    Bursitis of left shoulder    "just finished PT" (07/30/2016)   Chest pain    a. 2003 Abnl stress test-->Cath: nonobs CAD.   CHF  (congestive heart failure) (HCC)    Chronic bronchitis (HCC)    Chronic kidney disease    stage 3   GERD (gastroesophageal reflux disease)    barrett's esophagus- Dr. Matthias Hughs   History of hiatal hernia    Hyperlipidemia    Hypertension    Hypothyroidism    Migraines    "sporatic; at least a few/year" (07/30/2016)   Mitral regurgitation    a. 07/2016 Echo: mild to mod MR;  b. 07/2016 TEE: mild MR.   Moderate aortic insufficiency    a. 07/2016 Echo: EF 55-60%, mod AI;  b. 07/2016 TEE: EF 50-55%, mod AI.   Obesity    s/p lap band surgery 6/10   OSA on CPAP    PAF (paroxysmal atrial fibrillation) (HCC)    a. 07/2016 TEE/DCCV: EF 50-55%, mild MR, mod AI, mild to mod TR, neg bubble study-->successful DCCV x1 w/ 120J.   PONV (postoperative nausea and vomiting)    Vitamin D deficiency    Past Surgical History:  Procedure Laterality Date   APPENDECTOMY     incidental   CARDIOVERSION N/A 08/01/2016   Procedure: CARDIOVERSION;  Surgeon: Jake Bathe, MD;  Location: [facility] ENDOSCOPY;  Service: Cardiovascular;  Laterality: N/A;   CARDIOVERSION N/A 06/24/2019   Procedure: CARDIOVERSION;  Surgeon: Thurmon Fair, MD;  Location: [facility] ENDOSCOPY;  Service: Cardiovascular;  Laterality: N/A;   CARDIOVERSION N/A 05/30/2020   Procedure: CARDIOVERSION;  Surgeon: Thurmon Fair, MD;  Location: [facility] ENDOSCOPY;  Service: Cardiovascular;  Laterality: N/A;   CARDIOVERSION N/A 05/09/2020   Procedure: CARDIOVERSION;  Surgeon: Little Ishikawa, MD;  Location: [facility] ENDOSCOPY;  Service: Cardiovascular;  Laterality: N/A;   CATARACT EXTRACTION W/ INTRAOCULAR LENS  IMPLANT, BILATERAL Bilateral ~ 2008-2017   left - right   COMBINED HYSTERECTOMY VAGINAL / OOPHORECTOMY / A&P REPAIR  1981   HERNIA REPAIR     INGUINAL HERNIA REPAIR Left 1952   LAPAROSCOPIC GASTRIC BANDING WITH HIATAL HERNIA REPAIR  09/2008   RIGHT/LEFT HEART CATH AND CORONARY ANGIOGRAPHY N/A 04/11/2022   Procedure: RIGHT/LEFT HEART CATH AND CORONARY  ANGIOGRAPHY;  Surgeon: Corky Crafts, MD;  Location: [facility] INVASIVE CV LAB;  Service: Cardiovascular;  Laterality: N/A;   TEE WITHOUT CARDIOVERSION N/A 08/01/2016   Procedure: TRANSESOPHAGEAL ECHOCARDIOGRAM (TEE);  Surgeon: Jake Bathe, MD;  Location: [facility] ENDOSCOPY;  Service: Cardiovascular;  Laterality: N/A;   TONSILLECTOMY AND ADENOIDECTOMY  1974   VAGINAL HYSTERECTOMY  1981   "w/1 ovary"     A IV Location/Drains/Wounds Patient Lines/Drains/Airways Status     Active Line/Drains/Airways     Name Placement date Placement time Site Days   Peripheral IV 12/31/22 20 G Anterior;Left;Proximal Forearm 12/31/22  2205  Forearm  1            Intake/Output Last 24 hours  Intake/Output Summary (Last 24 hours) at 01/01/2023 0722 Last data filed at 01/01/2023 0400 Gross per 24 hour  Intake --  Output 1000 ml  Net -1000 ml    Labs/Imaging Results for orders placed or performed during the hospital encounter of 12/31/22 (from the past 48 hour(s))  CBC     Status: Abnormal   Collection Time: 12/31/22 10:34 PM  Result Value Ref Range   WBC 12.0 (H) 4.0 - 10.5 K/uL   RBC 4.23 3.87 - 5.11 MIL/uL   Hemoglobin 9.8 (L) 12.0 - 15.0 g/dL   HCT 84.6 (L) 96.2 - 95.2 %   MCV 80.6 80.0 - 100.0 fL   MCH 23.2 (L) 26.0 - 34.0 pg   MCHC 28.7 (L) 30.0 - 36.0 g/dL   RDW 84.1 (H) 32.4 - 40.1 %   Platelets 254 150 - 400 K/uL   nRBC 0.0 0.0 - 0.2 %    Comment: Performed at [facility], [address], [place], [place] [address]  Troponin I (High Sensitivity)     Status: None   Collection Time: 12/31/22 10:34 PM  Result Value Ref Range   Troponin I (High Sensitivity) 6 <18 ng/L    Comment: (NOTE) Elevated high sensitivity troponin I (hsTnI) values and significant  changes across serial measurements may suggest ACS but many other  chronic and acute conditions are known to elevate hsTnI results.  Refer to the "Links" section for chest pain algorithms and additional  guidance. Performed at  [facility], [address], [place], [place] [address]   Comprehensive metabolic panel     Status: Abnormal   Collection Time: 12/31/22 10:34 PM  Result Value Ref Range   Sodium 140 135 - 145 mmol/L   Potassium 4.4 3.5 - 5.1 mmol/L   Chloride 109 98 - 111 mmol/L   CO2 23 22 - 32 mmol/L   Glucose, Bld 125 (H) 70 - 99 mg/dL    Comment: Glucose reference range applies only to samples taken after fasting for at least 8  hours.   BUN 21 8 - 23 mg/dL   Creatinine, Ser 4.54 (H) 0.44 - 1.00 mg/dL   Calcium 8.1 (L) 8.9 - 10.3 mg/dL   Total Protein 6.8 6.5 - 8.1 g/dL   Albumin 3.5 3.5 - 5.0 g/dL   AST 18 15 - 41 U/L   ALT 17 0 - 44 U/L   Alkaline Phosphatase 95 38 - 126 U/L   Total Bilirubin 0.5 0.3 - 1.2 mg/dL   GFR, Estimated 46 (L) >60 mL/min    Comment: (NOTE) Calculated using the CKD-EPI Creatinine Equation (2021)    Anion gap 8 5 - 15    Comment: Performed at [facility], [address] [place], [place] [address]  Brain natriuretic peptide     Status: Abnormal   Collection Time: 12/31/22 10:34 PM  Result Value Ref Range   B Natriuretic Peptide 833.3 (H) 0.0 - 100.0 pg/mL    Comment: Performed at [facility], [address] [place], [place] [address]  Troponin I (High Sensitivity)     Status: None   Collection Time: 01/01/23  2:42 AM  Result Value Ref Range   Troponin I (High Sensitivity) 12 <18 ng/L    Comment: (NOTE) Elevated high sensitivity troponin I (hsTnI) values and significant  changes across serial measurements may suggest ACS but many other  chronic and acute conditions are known to elevate hsTnI results.  Refer to the "Links" section for chest pain algorithms and additional  guidance. Performed at [facility], [address], [place], [place] [address]   Resp panel by RT-PCR (RSV, Flu A&B, Covid) Anterior Nasal Swab     Status: None   Collection Time: 01/01/23  2:55 AM   Specimen: Anterior Nasal Swab  Result Value Ref Range   SARS  Coronavirus 2 by RT PCR NEGATIVE NEGATIVE   Influenza A by PCR NEGATIVE NEGATIVE   Influenza B by PCR NEGATIVE NEGATIVE    Comment: (NOTE) The Xpert Xpress SARS-CoV-2/FLU/RSV plus assay is intended as an aid in the diagnosis of influenza from Nasopharyngeal swab specimens and should not be used as a sole basis for treatment. Nasal washings and aspirates are unacceptable for Xpert Xpress SARS-CoV-2/FLU/RSV testing.  Fact Sheet for Patients: BloggerCourse.com  Fact Sheet for Healthcare Providers: SeriousBroker.it  This test is not yet approved or cleared by the [place] FDA and has been authorized for detection and/or diagnosis of SARS-CoV-2 by FDA under an Emergency Use Authorization (EUA). This EUA will remain in effect (meaning this test can be used) for the duration of the COVID-19 declaration under Section 564(b)(1) of the Act, 21 U.S.C. section 360bbb-3(b)(1), unless the authorization is terminated or revoked.     Resp Syncytial Virus by PCR NEGATIVE NEGATIVE    Comment: (NOTE) Fact Sheet for Patients: BloggerCourse.com  Fact Sheet for Healthcare Providers: SeriousBroker.it  This test is not yet approved or cleared by the [place] FDA and has been authorized for detection and/or diagnosis of SARS-CoV-2 by FDA under an Emergency Use Authorization (EUA). This EUA will remain in effect (meaning this test can be used) for the duration of the COVID-19 declaration under Section 564(b)(1) of the Act, 21 U.S.C. section 360bbb-3(b)(1), unless the authorization is terminated or revoked.  Performed at [facility], [address], [place], [place] [address]    DG Chest 2 View  Result Date: 12/31/2022 CLINICAL DATA:  Chest pain and shortness of breath.  History of CHF EXAM: CHEST -  2 VIEW COMPARISON:  Radiographs 04/09/2022 FINDINGS: Stable cardiomegaly. Bilateral  interstitial opacities and hazy opacities greatest in the lower lungs. No definite pleural effusion. No pneumothorax. IMPRESSION: Findings compatible with CHF. Electronically Signed   By: Minerva Fester M.D.   On: 12/31/2022 22:58    Pending Labs Unresulted Labs (From admission, onward)    None       Vitals/Pain Today's Vitals   12/31/22 2221 01/01/23 0219 01/01/23 0258 01/01/23 0701  BP:      Pulse: 95     Resp: (!) 23     Temp: 97.7 F (36.5 C)  98.3 F (36.8 C) 97.6 F (36.4 C)  TempSrc: Oral  Oral Oral  SpO2: 100%     PainSc:  2       Isolation Precautions No active isolations  Medications Medications  acetaminophen (TYLENOL) tablet 1,000 mg (has no administration in time range)  amiodarone (PACERONE) tablet 200 mg (has no administration in time range)  apixaban (ELIQUIS) tablet 5 mg (has no administration in time range)  budesonide (PULMICORT) nebulizer solution 0.25 mg (has no administration in time range)  atorvastatin (LIPITOR) tablet 40 mg (has no administration in time range)  levothyroxine (SYNTHROID) tablet 50 mcg (has no administration in time range)  metoprolol succinate (TOPROL-XL) 24 hr tablet 50 mg (has no administration in time range)  pantoprazole (PROTONIX) EC tablet 40 mg (has no administration in time range)  sacubitril-valsartan (ENTRESTO) 49-51 mg per tablet (1 tablet Oral Given 01/01/23 0432)  furosemide (LASIX) injection 40 mg (40 mg Intravenous Given 12/31/22 2259)    Mobility walks     Focused Assessments Pulmonary Assessment Handoff:  Lung sounds:   O2 Device: High Flow Nasal Cannula O2 Flow Rate (L/min): 3 L/min    R Recommendations: See Admitting Provider Note  Report given to:   Additional Notes:

## 2023-01-01 NOTE — H&P (Signed)
History and Physical    Patient: Leslie [name] VHQ:[id number] DOB: [birth year] DOA: 12/31/2022 DOS: the patient was seen and examined on 01/01/2023 PCP: Shon Hale, MD  Patient coming from: Home  Chief Complaint:  Chief Complaint  Patient presents with   Shortness of Breath   Chest Pain   HPI: Leslie [name] is a 77 y.o. female with medical history significant of hypertension, morbid obesity, CKD, A-fib on Eliquis, diastolic heart failure grade 2 , presenting with worsening shortness of breath, diet indiscretion over the weekend, reports 2 -3 pounds weight gain.  Symptoms started suddenly, on EMS arrival she was found to be satting 80% on room air increased work of breathing.  Placed on 15 L nonrebreather mask.  She is not able to tolerate BiPAP due to severe claustrophobia. She reported episode of chest pain, while she was having the sudden onset of shortness of breath.  She checked her Apple Watch and her heart rate when in sinus rhythm, her blood pressure was higher than usual.  She is feeling a little bit better now but she does not feel at baseline.  She still short of breath.  She is currently on 3 L of oxygen.  She denies fever, runny nose, sore throat, cough, abdominal pain, nausea vomiting diarrhea.  Evaluation in the ED: White blood cell 12, hemoglobin 9.8, troponin 6, sodium 140, creatinine 1.2, BP 833, chest x-ray: Findings compatible with heart failure.  EKG: Sinus, ectopic arrhythmia   Review of Systems: As mentioned in the history of present illness. All other systems reviewed and are negative. Past Medical History:  Diagnosis Date   Adenomatous polyp    Arthritis    "knees, legs, fingers" (07/30/2016)   Asthma    Bursitis of left shoulder    "just finished PT" (07/30/2016)   Chest pain    a. 2003 Abnl stress test-->Cath: nonobs CAD.   CHF (congestive heart failure) (HCC)    Chronic bronchitis (HCC)    Chronic kidney disease    stage 3   GERD  (gastroesophageal reflux disease)    barrett's esophagus- Dr. Matthias Hughs   History of hiatal hernia    Hyperlipidemia    Hypertension    Hypothyroidism    Migraines    "sporatic; at least a few/year" (07/30/2016)   Mitral regurgitation    a. 07/2016 Echo: mild to mod MR;  b. 07/2016 TEE: mild MR.   Moderate aortic insufficiency    a. 07/2016 Echo: EF 55-60%, mod AI;  b. 07/2016 TEE: EF 50-55%, mod AI.   Obesity    s/p lap band surgery 6/10   OSA on CPAP    PAF (paroxysmal atrial fibrillation) (HCC)    a. 07/2016 TEE/DCCV: EF 50-55%, mild MR, mod AI, mild to mod TR, neg bubble study-->successful DCCV x1 w/ 120J.   PONV (postoperative nausea and vomiting)    Vitamin D deficiency    Past Surgical History:  Procedure Laterality Date   APPENDECTOMY     incidental   CARDIOVERSION N/A 08/01/2016   Procedure: CARDIOVERSION;  Surgeon: Jake Bathe, MD;  Location: [facility] ENDOSCOPY;  Service: Cardiovascular;  Laterality: N/A;   CARDIOVERSION N/A 06/24/2019   Procedure: CARDIOVERSION;  Surgeon: Thurmon Fair, MD;  Location: [facility] ENDOSCOPY;  Service: Cardiovascular;  Laterality: N/A;   CARDIOVERSION N/A 05/30/2020   Procedure: CARDIOVERSION;  Surgeon: Thurmon Fair, MD;  Location: [facility] ENDOSCOPY;  Service: Cardiovascular;  Laterality: N/A;   CARDIOVERSION N/A 05/09/2020   Procedure: CARDIOVERSION;  Surgeon: Bjorn Pippin,  Tanna Savoy, MD;  Location: [facility] ENDOSCOPY;  Service: Cardiovascular;  Laterality: N/A;   CATARACT EXTRACTION W/ INTRAOCULAR LENS  IMPLANT, BILATERAL Bilateral ~ 2008-2017   left - right   COMBINED HYSTERECTOMY VAGINAL / OOPHORECTOMY / A&P REPAIR  1981   HERNIA REPAIR     INGUINAL HERNIA REPAIR Left 1952   LAPAROSCOPIC GASTRIC BANDING WITH HIATAL HERNIA REPAIR  09/2008   RIGHT/LEFT HEART CATH AND CORONARY ANGIOGRAPHY N/A 04/11/2022   Procedure: RIGHT/LEFT HEART CATH AND CORONARY ANGIOGRAPHY;  Surgeon: Corky Crafts, MD;  Location: [facility] INVASIVE CV LAB;  Service: Cardiovascular;   Laterality: N/A;   TEE WITHOUT CARDIOVERSION N/A 08/01/2016   Procedure: TRANSESOPHAGEAL ECHOCARDIOGRAM (TEE);  Surgeon: Jake Bathe, MD;  Location: [facility] ENDOSCOPY;  Service: Cardiovascular;  Laterality: N/A;   TONSILLECTOMY AND ADENOIDECTOMY  1974   VAGINAL HYSTERECTOMY  1981   "w/1 ovary"   Social History:  reports that she has quit smoking. She has never used smokeless tobacco. She reports current alcohol use of about 2.0 standard drinks of alcohol per week. She reports that she does not use drugs.  Allergies  Allergen Reactions   Clindamycin Hcl     Lip and face swelling   Augmentin [Amoxicillin-Pot Clavulanate] Diarrhea    Did it involve swelling of the face/tongue/throat, SOB, or low BP? No Did it involve sudden or severe rash/hives, skin peeling, or any reaction on the inside of your mouth or nose? No Did you need to seek medical attention at a hospital or doctor's office? No When did it last happen?      10 + years If all above answers are "NO", may proceed with cephalosporin use.    Codeine Nausea Only and Other (See Comments)    Hyperactivity   Demerol [Meperidine] Nausea And Vomiting   Sulfa Antibiotics Rash    Family History  Problem Relation Age of Onset   Heart failure Mother    Hypertension Mother    Hypertension Father    Emphysema Father     Prior to Admission medications   Medication Sig Start Date End Date Taking? Authorizing Provider  acetaminophen (TYLENOL) 650 MG CR tablet Take 1,300 mg by mouth every 8 (eight) hours as needed for pain.   Yes [provider]  amiodarone (PACERONE) 200 MG tablet Take 1 tablet (200 mg total) by mouth daily. 04/18/22  Yes Monge, Petra Kuba, NP  apixaban (ELIQUIS) 5 MG TABS tablet Take 1 tablet (5 mg total) by mouth 2 (two) times daily. 04/18/22  Yes Monge, Petra Kuba, NP  ARNUITY ELLIPTA 50 MCG/ACT AEPB Inhale 1 puff into the lungs at bedtime. 04/26/22  Yes [provider]  atorvastatin (LIPITOR) 40 MG tablet Take 1  tablet (40 mg total) by mouth daily. 04/18/22  Yes Monge, Petra Kuba, NP  Bacillus Coagulans-Inulin (ALIGN PREBIOTIC-PROBIOTIC PO) Take 2 capsules by mouth daily. Gummy   Yes [provider]  cholecalciferol (VITAMIN D) 1000 UNITS tablet Take 1,000 Units by mouth daily.   Yes [provider]  diclofenac Sodium (VOLTAREN) 1 % GEL Apply 1 application topically 2 (two) times daily.   Yes [provider]  fexofenadine (ALLEGRA) 180 MG tablet Take 180 mg by mouth daily.   Yes [provider]  fluticasone (FLONASE) 50 MCG/ACT nasal spray Place 2 sprays into both nostrils at bedtime.    Yes [provider]  furosemide (LASIX) 40 MG tablet Take 1 tablet (40 mg total) by mouth as needed. For weight greater than 261  lbs 04/30/22  Yes Clegg, Amy D, NP  Glucosamine-Chondroitin (OSTEO BI-FLEX REGULAR STRENGTH PO) Take 1 tablet by mouth daily.   Yes [provider]  levothyroxine (SYNTHROID, LEVOTHROID) 50 MCG tablet Take 50 mcg by mouth daily before breakfast.   Yes [provider]  magnesium oxide (MAG-OX) 400 (240 Mg) MG tablet Take 1 tablet (400 mg total) by mouth daily. 06/03/22  Yes Croitoru, Mihai, MD  metoprolol succinate (TOPROL-XL) 100 MG 24 hr tablet Take 50mg  in the morning and 100mg  in the evening Patient taking differently: Take 50 mg by mouth in the morning and at bedtime. May take 1 tablet for sBP of 160 or 170 07/30/22  Yes Croitoru, Mihai, MD  metroNIDAZOLE (METROCREAM) 0.75 % cream Apply 1 application  topically daily. 05/16/20  Yes [provider]  mometasone (ELOCON) 0.1 % cream Apply 1 application  topically 2 (two) times daily as needed (skin irritation). 05/18/20  Yes [provider]  Multiple Vitamins-Minerals (CENTRUM MULTIGUMMIES) CHEW Chew 1 tablet by mouth daily.   Yes [provider]  omeprazole (PRILOSEC) 20 MG capsule Take 40 mg by mouth daily before breakfast. 07/27/16  Yes [provider]   sacubitril-valsartan (ENTRESTO) 49-51 MG Take 1 tablet by mouth 2 (two) times daily. 04/30/22  Yes Clegg, Amy D, NP    Physical Exam: Vitals:   01/01/23 1000 01/01/23 1030 01/01/23 1034 01/01/23 1130  BP: (!) 144/76 (!) 146/77 (!) 146/77 (!) 158/74  Pulse:  (!) 56 (!) 59 (!) 57  Resp: 15 13  14   Temp:      TempSrc:      SpO2:  100%  100%   General: Mild dyspneic CVS: S1, S2 regular rhythm or rate Lungs: Bilateral air movement, bilateral crackles Abdomen: Sounds present, soft nontender nondistended obese abdomen Extremities.  Trace edema Neuro: Alert and oriented x 3, follows commands  Data Reviewed:  Labs reviewed.   Assessment and Plan: No notes have been filed under this hospital service. Service: Hospitalist  1-Acute Hypoxic Respiratory failure in the setting of Acute Diastolic heart failure exacerbation: Patient presented with shortness of breath, weight gain, elevated BNP, chest x-ray with finding concerning for heart failure exacerbation. Continue with IV Lasix Strict I's and O's Daily weight Cardiology consulted Will hold Entresto, make sure renal function is stable.    2-History of A fib Continue with eliquis, amiodarone, metoprolol.    3-HLD; continue with lipitor.   4-GERD; PPI.  5-Hypothyroidism; continue with synthroid.  6-Chronic Bronchitis:  Continue pulmicort, on Ellipta at home  7-HTN; IV lasix.  PRN Hydrazine. Metoprolol/  8-CKD stage IIIa; monitor.    Advance Care Planning:   Code Status: Full Code full code  Consults: Cardiology   Family Communication: care discussed with patient.   Severity of Illness: The appropriate patient status for this patient is INPATIENT. Inpatient status is judged to be reasonable and necessary in order to provide the required intensity of service to ensure the patient's safety. The patient's presenting symptoms, physical exam findings, and initial radiographic and laboratory data in the context of their chronic  comorbidities is felt to place them at high risk for further clinical deterioration. Furthermore, it is not anticipated that the patient will be medically stable for discharge from the hospital within 2 midnights of admission.   * I certify that at the point of admission it is my clinical judgment that the patient will require inpatient hospital care spanning beyond 2 midnights from the point of admission due to  high intensity of service, high risk for further deterioration and high frequency of surveillance required.*  Author: Alba Cory, MD 01/01/2023 12:42 PM  For on call review [email or web address].

## 2023-01-01 NOTE — Consult Note (Addendum)
Cardiology Consultation   Patient ID: DACI ARNT MRN: [id number]; DOB: 1945-12-09  Admit date: 12/31/2022 Date of Consult: 01/01/2023  PCP:  Shon Hale, MD   [facility] Providers Cardiologist:  Thurmon Fair, MD     Patient Profile:   Leslie [name] is a 77 y.o. female with a hx of persistent atrial fibrillation, HFpEF, hypertension, OSA, prediabetes, hyperlipidemia, moderate MR, moderate AI, morbid obesity who is being seen 01/01/2023 for the evaluation of CHF at the request of Dr. Sunnie Nielsen.  History of Present Illness:   Leslie [name] is a 77 year old female with past medical history noted above.  She has been followed by Dr. Royann Shivers as an outpatient.  She was initially diagnosed with atrial fibrillation 07/2016 and had cardioversion at that time.  After a second dose of COVID-vaccine 05/2019 she developed recurrent atrial fibrillation with RVR with difficult to control rate and underwent repeat cardioversion.  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  Flecainide and verapamil were stopped and she was continued on metoprolol, started on Entresto and amiodarone.  Follow-up echocardiogram 05/2022 showed normalization of LV function.  She had been on amiodarone for about 3 to 4 months before she had a repeat episode of atrial fibrillation.  This resolved spontaneously.   She was seen back in the office on 07/2022 with Dr. Royann Shivers and continued on her home medications without further adjustment.  Last visit 10/2022 was virtual with Robin Searing, NP for preoperative evaluation for endoscopy/colonoscopy.  She was just okay to hold her Eliquis 2 days prior to procedure and then restart.  Presented to the ED on 9/17 with complaints of worsening dyspnea, weight gain and found to be hypoxic with O2 sats in the 80s on room air on EMS arrival.  She was attempted on BiPAP but did not tolerate due to claustrophobia.  Was transitioned to  15 L nonrebreather.  In the ED her labs showed sodium 140, potassium 4.4, creatinine 1.2, BNP 833, high-sensitivity troponin 6>> 12, WBC 12, hemoglobin 9.8.  Respiratory panel negative.  Chest x-ray with bilateral interstitial opacities.  EKG showed sinus rhythm, 96 bpm.  She was admitted to internal medicine for further management.  Cardiology has been consulted in regards to her CHF.  In talking with the patient, she reports dietary indiscretion over the weekend while she had company over to her house. Says her dry weight is around 252 lbs and noted 2-3lbs up. She normally takes lasix PRN, has not had any recent doses.     Past Medical History:  Diagnosis Date   Adenomatous polyp    Arthritis    "knees, legs, fingers" (07/30/2016)   Asthma    Bursitis of left shoulder    "just finished PT" (07/30/2016)   Chest pain    a. 2003 Abnl stress test-->Cath: nonobs CAD.   CHF (congestive heart failure) (HCC)    Chronic bronchitis (HCC)    Chronic kidney disease    stage 3   GERD (gastroesophageal reflux disease)    barrett's esophagus- Dr. Matthias Hughs   History of hiatal hernia    Hyperlipidemia    Hypertension    Hypothyroidism    Migraines    "sporatic; at least a few/year" (07/30/2016)   Mitral regurgitation    a. 07/2016 Echo: mild to mod MR;  b. 07/2016 TEE: mild MR.   Moderate aortic insufficiency    a. 07/2016 Echo: EF 55-60%, mod AI;  b.  07/2016 TEE: EF 50-55%, mod AI.   Obesity    s/p lap band surgery 6/10   OSA on CPAP    PAF (paroxysmal atrial fibrillation) (HCC)    a. 07/2016 TEE/DCCV: EF 50-55%, mild MR, mod AI, mild to mod TR, neg bubble study-->successful DCCV x1 w/ 120J.   PONV (postoperative nausea and vomiting)    Vitamin D deficiency     Past Surgical History:  Procedure Laterality Date   APPENDECTOMY     incidental   CARDIOVERSION N/A 08/01/2016   Procedure: CARDIOVERSION;  Surgeon: Jake Bathe, MD;  Location: [facility] ENDOSCOPY;  Service: Cardiovascular;  Laterality: N/A;    CARDIOVERSION N/A 06/24/2019   Procedure: CARDIOVERSION;  Surgeon: Thurmon Fair, MD;  Location: [facility] ENDOSCOPY;  Service: Cardiovascular;  Laterality: N/A;   CARDIOVERSION N/A 05/30/2020   Procedure: CARDIOVERSION;  Surgeon: Thurmon Fair, MD;  Location: [facility] ENDOSCOPY;  Service: Cardiovascular;  Laterality: N/A;   CARDIOVERSION N/A 05/09/2020   Procedure: CARDIOVERSION;  Surgeon: Little Ishikawa, MD;  Location: [facility] ENDOSCOPY;  Service: Cardiovascular;  Laterality: N/A;   CATARACT EXTRACTION W/ INTRAOCULAR LENS  IMPLANT, BILATERAL Bilateral ~ 2008-2017   left - right   COMBINED HYSTERECTOMY VAGINAL / OOPHORECTOMY / A&P REPAIR  1981   HERNIA REPAIR     INGUINAL HERNIA REPAIR Left 1952   LAPAROSCOPIC GASTRIC BANDING WITH HIATAL HERNIA REPAIR  09/2008   RIGHT/LEFT HEART CATH AND CORONARY ANGIOGRAPHY N/A 04/11/2022   Procedure: RIGHT/LEFT HEART CATH AND CORONARY ANGIOGRAPHY;  Surgeon: Corky Crafts, MD;  Location: [facility] INVASIVE CV LAB;  Service: Cardiovascular;  Laterality: N/A;   TEE WITHOUT CARDIOVERSION N/A 08/01/2016   Procedure: TRANSESOPHAGEAL ECHOCARDIOGRAM (TEE);  Surgeon: Jake Bathe, MD;  Location: [facility] ENDOSCOPY;  Service: Cardiovascular;  Laterality: N/A;   TONSILLECTOMY AND ADENOIDECTOMY  1974   VAGINAL HYSTERECTOMY  1981   "w/1 ovary"     Inpatient Medications: Scheduled Meds:  amiodarone  200 mg Oral Daily   apixaban  5 mg Oral BID   atorvastatin  40 mg Oral Daily   budesonide  0.25 mg Inhalation BID   fluticasone  2 spray Each Nare Daily   furosemide  40 mg Intravenous BID   levothyroxine  50 mcg Oral QAC breakfast   loratadine  10 mg Oral Daily   metoprolol succinate  50 mg Oral Daily   pantoprazole  40 mg Oral Daily   sodium chloride flush  3 mL Intravenous Q12H   Continuous Infusions:  sodium chloride     PRN Meds: sodium chloride, acetaminophen, albuterol, hydrALAZINE, HYDROcodone-acetaminophen, ondansetron **OR** ondansetron (ZOFRAN) IV,  polyethylene glycol, sodium chloride flush  Allergies:    Allergies  Allergen Reactions   Clindamycin Hcl     Lip and face swelling   Augmentin [Amoxicillin-Pot Clavulanate] Diarrhea    Did it involve swelling of the face/tongue/throat, SOB, or low BP? No Did it involve sudden or severe rash/hives, skin peeling, or any reaction on the inside of your mouth or nose? No Did you need to seek medical attention at a hospital or doctor's office? No When did it last happen?      10 + years If all above answers are "NO", may proceed with cephalosporin use.    Codeine Nausea Only and Other (See Comments)    Hyperactivity   Demerol [Meperidine] Nausea And Vomiting   Sulfa Antibiotics Rash    Social History:   Social History   Socioeconomic History   Marital status: Divorced    Spouse  name: Not on file   Number of children: Not on file   Years of education: 16   Highest education level: Not on file  Occupational History   Occupation: Retired  Tobacco Use   Smoking status: Former   Smokeless tobacco: Never   Tobacco comments:    07/30/2016 "social smoker only; in college"  Vaping Use   Vaping status: Never Used  Substance and Sexual Activity   Alcohol use: Yes    Alcohol/week: 2.0 standard drinks of alcohol    Types: 1 Glasses of wine, 1 Standard drinks or equivalent per week    Comment: 07/30/2016 "couple drinks/month"   Drug use: No   Sexual activity: Not Currently  Other Topics Concern   Not on file  Social History Narrative   Not on file   Social Determinants of Health   Financial Resource Strain: Medium Risk (04/12/2022)   Overall Financial Resource Strain (CARDIA)    Difficulty of Paying Living Expenses: Somewhat hard  Food Insecurity: No Food Insecurity (04/09/2022)   Hunger Vital Sign    Worried About Running Out of Food in the Last Year: Never true    Ran Out of Food in the Last Year: Never true  Transportation Needs: No Transportation Needs (04/09/2022)    PRAPARE - Administrator, Civil Service (Medical): No    Lack of Transportation (Non-Medical): No  Physical Activity: Not on file  Stress: Not on file  Social Connections: Not on file  Intimate Partner Violence: Not At Risk (04/09/2022)   Humiliation, Afraid, Rape, and Kick questionnaire    Fear of Current or Ex-Partner: No    Emotionally Abused: No    Physically Abused: No    Sexually Abused: No    Family History:    Family History  Problem Relation Age of Onset   Heart failure Mother    Hypertension Mother    Hypertension Father    Emphysema Father      ROS:  Please see the history of present illness.   All other ROS reviewed and negative.     Physical Exam/Data:   Vitals:   01/01/23 1000 01/01/23 1030 01/01/23 1034 01/01/23 1130  BP: (!) 144/76 (!) 146/77 (!) 146/77 (!) 158/74  Pulse:  (!) 56 (!) 59 (!) 57  Resp: 15 13  14   Temp:      TempSrc:      SpO2:  100%  100%    Intake/Output Summary (Last 24 hours) at 01/01/2023 1321 Last data filed at 01/01/2023 0400 Gross per 24 hour  Intake --  Output 1000 ml  Net -1000 ml      08/21/2022    2:15 PM 07/30/2022   11:07 AM 07/26/2022   11:26 AM  Last 3 Weights  Weight (lbs) 253 lb 8.5 oz 253 lb 9.6 oz 255 lb 12.8 oz  Weight (kg) 115 kg 115.032 kg 116.03 kg     There is no height or weight on file to calculate BMI.  General:  Well nourished, well developed, in no acute distress HEENT: normal Neck: no JVD Vascular: No carotid bruits; Distal pulses 2+ bilaterally Cardiac:  normal S1, S2; RRR; + systolic murmur  Lungs:  mildly diminished in bases Abd: soft, nontender, no hepatomegaly  Ext: no edema Musculoskeletal:  No deformities, BUE and BLE strength normal and equal Skin: warm and dry  Neuro:  CNs 2-12 intact, no focal abnormalities noted Psych:  Normal affect   EKG:  The EKG was personally  reviewed and demonstrates:  sinus rhythm, 93 bpm Telemetry:  Telemetry was personally reviewed and  demonstrates:  sinus rhythm, with intermittent episodes of junctional rhythm?  Relevant CV Studies:  Echo: 05/2022  IMPRESSIONS     1. Left ventricular ejection fraction, by estimation, is 65 to 70%. The  left ventricle has normal function. The left ventricle has no regional  wall motion abnormalities. Left ventricular diastolic parameters are  consistent with Grade II diastolic  dysfunction (pseudonormalization). Elevated left ventricular end-diastolic  pressure.   2. Right ventricular systolic function is normal. The right ventricular  size is normal. There is normal pulmonary artery systolic pressure.   3. Left atrial size was severely dilated.   4. The mitral valve is normal in structure. Moderate mitral valve  regurgitation. No evidence of mitral stenosis.   5. The aortic valve is tricuspid. Aortic valve regurgitation is moderate.  No aortic stenosis is present. Aortic regurgitation PHT measures 653 msec.   6. The inferior vena cava is normal in size with greater than 50%  respiratory variability, suggesting right atrial pressure of 3 mmHg.   FINDINGS   Left Ventricle: Left ventricular ejection fraction, by estimation, is 65  to 70%. The left ventricle has normal function. The left ventricle has no  regional wall motion abnormalities. The left ventricular internal cavity  size was normal in size. There is   no left ventricular hypertrophy. Left ventricular diastolic parameters  are consistent with Grade II diastolic dysfunction (pseudonormalization).  Elevated left ventricular end-diastolic pressure.   Right Ventricle: The right ventricular size is normal. No increase in  right ventricular wall thickness. Right ventricular systolic function is  normal. There is normal pulmonary artery systolic pressure. The tricuspid  regurgitant velocity is 2.49 m/s, and   with an assumed right atrial pressure of 3 mmHg, the estimated right  ventricular systolic pressure is 27.8 mmHg.    Left Atrium: Left atrial size was severely dilated.   Right Atrium: Right atrial size was normal in size.   Pericardium: There is no evidence of pericardial effusion.   Mitral Valve: The mitral valve is normal in structure. Moderate mitral  valve regurgitation. No evidence of mitral valve stenosis.   Tricuspid Valve: The tricuspid valve is normal in structure. Tricuspid  valve regurgitation is mild . No evidence of tricuspid stenosis.   Aortic Valve: The aortic valve is tricuspid. Aortic valve regurgitation is  moderate. Aortic regurgitation PHT measures 653 msec. No aortic stenosis  is present.   Pulmonic Valve: The pulmonic valve was normal in structure. Pulmonic valve  regurgitation is trivial. No evidence of pulmonic stenosis.   Aorta: The aortic root is normal in size and structure.   Venous: The inferior vena cava is normal in size with greater than 50%  respiratory variability, suggesting right atrial pressure of 3 mmHg.   IAS/Shunts: No atrial level shunt detected by color flow Doppler.    Laboratory Data:  High Sensitivity Troponin:   Recent Labs  Lab 12/31/22 2234 01/01/23 0242  TROPONINIHS 6 12     Chemistry Recent Labs  Lab 12/31/22 2234 01/01/23 0840  NA 140 140  K 4.4 4.0  CL 109 106  CO2 23 24  GLUCOSE 125* 106*  BUN 21 18  CREATININE 1.22* 1.21*  CALCIUM 8.1* 8.2*  GFRNONAA 46* 46*  ANIONGAP 8 10    Recent Labs  Lab 12/31/22 2234  PROT 6.8  ALBUMIN 3.5  AST 18  ALT 17  ALKPHOS 95  BILITOT  0.5   Lipids No results for input(s): "CHOL", "TRIG", "HDL", "LABVLDL", "LDLCALC", "CHOLHDL" in the last 168 hours.  Hematology Recent Labs  Lab 12/31/22 2234 01/01/23 0840  WBC 12.0* 8.9  RBC 4.23 4.14  HGB 9.8* 9.6*  HCT 34.1* 32.7*  MCV 80.6 79.0*  MCH 23.2* 23.2*  MCHC 28.7* 29.4*  RDW 16.1* 16.2*  PLT 254 263   Thyroid No results for input(s): "TSH", "FREET4" in the last 168 hours.  BNP Recent Labs  Lab 12/31/22 2234  BNP  833.3*    DDimer No results for input(s): "DDIMER" in the last 168 hours.   Radiology/Studies:  DG Chest 2 View  Result Date: 12/31/2022 CLINICAL DATA:  Chest pain and shortness of breath.  History of CHF EXAM: CHEST - 2 VIEW COMPARISON:  Radiographs 04/09/2022 FINDINGS: Stable cardiomegaly. Bilateral interstitial opacities and hazy opacities greatest in the lower lungs. No definite pleural effusion. No pneumothorax. IMPRESSION: Findings compatible with CHF. Electronically Signed   By: Minerva Fester M.D.   On: 12/31/2022 22:58     Assessment and Plan:   Leslie [name] is a 77 y.o. female with a hx of persistent atrial fibrillation, HFpEF, hypertension, OSA, prediabetes, hyperlipidemia, moderate MR, moderate AI, morbid obesity who is being seen 01/01/2023 for the evaluation of CHF at the request of Dr. Sunnie Nielsen.  HFpEF Acute hypoxic respiratory failure -- presented with sudden onset of dyspnea, precipitated by orthopnea in the setting of dietary indiscretion over the weekend. BNP 800s and CXR with bilateral interstitial opacities  -- has been started on IV lasix, net - 1L thus far -- has been on Toprol XL 100mg  daily, Entresto 49-51mg  BID PTA. Entresto currently held in the setting of diuresis  Hx of persistent atrial fibrillation -- currently in sinus rhythm -- maintained with amiodarone and Toprol XL -- on Eliquis 5mg  BID  HTN -- blood pressures elevated in the ED -- continue Toprol XL  Moderate MR OSA Obesity   Risk Assessment/Risk Scores:   New York Heart Association (NYHA) Functional Class NYHA Class II  CHA2DS2-VASc Score = 5   This indicates a 7.2% annual risk of stroke. The patient's score is based upon: CHF History: 1 HTN History: 1 Diabetes History: 0 Stroke History: 0 Vascular Disease History: 0 Age Score: 2 Gender Score: 1    For questions or updates, please contact [facility] Please consult [email or web address] for contact info under     Signed, Laverda Page, NP  01/01/2023 1:21 PM  I have personally seen and examined this patient. I agree with the assessment and plan as outlined above.  77 yo female with history of persistent atrial fib, HFpEF, HTN, OSA, HLD, moderate MR, moderate AI and obesity presenting to the ED with c/o dyspnea, weight gain and found to have O2 sats in the 80s on room air. She was placed on 15 L supplemental O2 and given IV Lasix. She has diuresed well this afternoon and feels much better. Her supplemental O2 is down to 3L and she is satting 100%. No chest pain.  Sinus on tele Labs reviewed by me My exam: Obese female in NAD. CV:RRR with systolic murmur. Lungs: clear bilaterally Ext: no LE edema Plan: Evidence of volume overload suggesting acute on chronic diastolic CHF. She has already diuresed well this afternoon. Will follow renal function closely. No additional cardiac workup at this time. We will see her in the morning.   Verne Carrow, MD, [facility] 01/01/2023 3:02 PM

## 2023-01-02 DIAGNOSIS — J9621 Acute and chronic respiratory failure with hypoxia: Secondary | ICD-10-CM | POA: Diagnosis not present

## 2023-01-02 DIAGNOSIS — I5033 Acute on chronic diastolic (congestive) heart failure: Secondary | ICD-10-CM | POA: Diagnosis not present

## 2023-01-02 LAB — COMPREHENSIVE METABOLIC PANEL
ALT: 18 U/L (ref 0–44)
AST: 21 U/L (ref 15–41)
Albumin: 3.5 g/dL (ref 3.5–5.0)
Alkaline Phosphatase: 82 U/L (ref 38–126)
Anion gap: 13 (ref 5–15)
BUN: 22 mg/dL (ref 8–23)
CO2: 25 mmol/L (ref 22–32)
Calcium: 8.3 mg/dL — ABNORMAL LOW (ref 8.9–10.3)
Chloride: 101 mmol/L (ref 98–111)
Creatinine, Ser: 1.4 mg/dL — ABNORMAL HIGH (ref 0.44–1.00)
GFR, Estimated: 39 mL/min — ABNORMAL LOW (ref >60)
Glucose, Bld: 89 mg/dL (ref 70–99)
Potassium: 3.6 mmol/L (ref 3.5–5.1)
Sodium: 139 mmol/L (ref 135–145)
Total Bilirubin: 0.9 mg/dL (ref 0.3–1.2)
Total Protein: 6.6 g/dL (ref 6.5–8.1)

## 2023-01-02 LAB — CBC
HCT: 32.1 % — ABNORMAL LOW (ref 36.0–46.0)
Hemoglobin: 9.8 g/dL — ABNORMAL LOW (ref 12.0–15.0)
MCH: 23.6 pg — ABNORMAL LOW (ref 26.0–34.0)
MCHC: 30.5 g/dL (ref 30.0–36.0)
MCV: 77.2 fL — ABNORMAL LOW (ref 80.0–100.0)
Platelets: 262 10*3/uL (ref 150–400)
RBC: 4.16 MIL/uL (ref 3.87–5.11)
RDW: 16.1 % — ABNORMAL HIGH (ref 11.5–15.5)
WBC: 8.7 10*3/uL (ref 4.0–10.5)
nRBC: 0 % (ref 0.0–0.2)

## 2023-01-02 LAB — GLUCOSE, CAPILLARY: Glucose-Capillary: 89 mg/dL (ref 70–99)

## 2023-01-02 LAB — MAGNESIUM: Magnesium: 1.6 mg/dL — ABNORMAL LOW (ref 1.7–2.4)

## 2023-01-02 MED ORDER — INFLUENZA VAC A&B SURF ANT ADJ 0.5 ML IM SUSY
0.5000 mL | PREFILLED_SYRINGE | INTRAMUSCULAR | Status: AC
  Administered 2023-01-03: 0.5 mL via INTRAMUSCULAR
  Filled 2023-01-02: qty 0.5

## 2023-01-02 MED ORDER — POTASSIUM CHLORIDE CRYS ER 20 MEQ PO TBCR
20.0000 meq | EXTENDED_RELEASE_TABLET | Freq: Once | ORAL | Status: AC
  Administered 2023-01-02: 20 meq via ORAL
  Filled 2023-01-02: qty 1

## 2023-01-02 MED ORDER — MAGNESIUM SULFATE 4 GM/100ML IV SOLN
4.0000 g | Freq: Once | INTRAVENOUS | Status: AC
  Administered 2023-01-02: 4 g via INTRAVENOUS
  Filled 2023-01-02: qty 100

## 2023-01-02 NOTE — Progress Notes (Addendum)
   Patient Name: Leslie [name] Date of Encounter: 01/02/2023 [facility] Cardiologist: Thurmon Fair, MD   Interval Summary  .    Patient reports that her breathing has improved, but she continues to get a bit short of breath when she walks to and from the bathroom. Bathroom is only about 7 feet from her bed. Denies chest pain. Did not have orthopnea overnight. Continues to feel like her ankles are a bit swollen   Vital Signs .    Vitals:   01/02/23 0500 01/02/23 0513 01/02/23 0545 01/02/23 0806  BP:  (!) 144/89    Pulse:  64 62   Resp:  16    Temp:  98 F (36.7 C)    TempSrc:  Oral    SpO2:  95% 95% 98%  Weight: 113.1 kg       Intake/Output Summary (Last 24 hours) at 01/02/2023 0846 Last data filed at 01/02/2023 0839 Gross per 24 hour  Intake 240 ml  Output 750 ml  Net -510 ml      01/02/2023    5:00 AM 08/21/2022    2:15 PM 07/30/2022   11:07 AM  Last 3 Weights  Weight (lbs) 249 lb 4.8 oz 253 lb 8.5 oz 253 lb 9.6 oz  Weight (kg) 113.082 kg 115 kg 115.032 kg      Telemetry/ECG    NSR  - Personally Reviewed  Physical Exam .   GEN: No acute distress. Sitting comfortably on the side of the bed   Neck: No JVD Cardiac: RRR, no murmurs, rubs, or gallops. Radial pulses 2+ bilaterally  Respiratory: Clear to auscultation bilaterally. Normal work of breathing on room air  GI: Soft, nontender, non-distended  MS: Trace edema in BLE   Assessment & Plan .     Acute on Chronic HFpEF  Acute Hypoxic respiratory failure  Moderate MR  - Most recent echocardiogram from 05/2022 showed EF 65-70%, no regional wall motion abnormalities, grade II DD, normal RV function, moderate MR  - Patient presented with dyspnea, orthopnea. Notes that she did not follow low sodium diet over the weekend. BNP elevated to 833. CXR with findings compatible with CHF  - Currently on IV lasix 40 mg BID- output 0.75 L urine yesterday. BMP pending this AM  - Patient appears close to euvolemic on  exam. Continues to have trace ankle edema and DOE. Suspect she would benefit from one more dose of IV lasix this evening with possible transition to PO tomorrow. Plan may be adjusted pending renal function today  - Consider starting SGLT2i pending renal function   Persistent Atrial Fibrillation  - Currently maintaining NSR per telemetry  - Continue amiodarone 200 mg daily, metoprolol succinate 50 mg daily, eliquis 5 mg BID   HTN  - Continue metoprolol succinate 40 mg daily   OSA - encouraged compliance with CPAP   For questions or updates, please contact [facility] Please consult [email or web address] for contact info under        Signed, Jonita Albee, PA-C

## 2023-01-02 NOTE — ED Notes (Signed)
ED TO INPATIENT HANDOFF REPORT  ED Nurse Name and Phone #:  Theadora Rama RN [phone]  S Name/Age/Gender Leslie [name] 77 y.o. female Room/Bed: 041C/041C  Code Status   Code Status: Full Code  Home/SNF/Other Home Patient oriented to: self, place, time, and situation Is this baseline? Yes   Triage Complete: Triage complete  Chief Complaint HF (heart failure) (HCC) [I50.9]  Triage Note Pt BIB EMS from home. C/o SHOB and CP starting at 6pm, sitting on couch when it started.  Hx chf. Pt reports similar episodes previously.   EMS admin 0.4mg  nitroglycerin  EMS VS: 96% 15L 02 nonrebreather, 86% on RA, 175/106, HR 97   Allergies Allergies  Allergen Reactions   Clindamycin Hcl     Lip and face swelling   Augmentin [Amoxicillin-Pot Clavulanate] Diarrhea    Did it involve swelling of the face/tongue/throat, SOB, or low BP? No Did it involve sudden or severe rash/hives, skin peeling, or any reaction on the inside of your mouth or nose? No Did you need to seek medical attention at a hospital or doctor's office? No When did it last happen?      10 + years If all above answers are "NO", may proceed with cephalosporin use.    Codeine Nausea Only and Other (See Comments)    Hyperactivity   Demerol [Meperidine] Nausea And Vomiting   Sulfa Antibiotics Rash    Level of Care/Admitting Diagnosis ED Disposition     ED Disposition  Admit   Condition  --   Comment  Hospital [facility]: [facility] [[id number]]  Level of Care: Telemetry Cardiac [[id number]]  May admit patient to [facility] or [facility] if equivalent level of care is available:: No  Covid Evaluation: Confirmed COVID Negative  Diagnosis: HF (heart failure) [facility]) [[id number]]  Admitting Physician: Alba Cory [phone]  Attending Physician: Alba Cory [phone]  Certification:: I certify this patient will need inpatient services for at least 2 midnights          B Medical/Surgery History Past Medical  History:  Diagnosis Date   Adenomatous polyp    Arthritis    "knees, legs, fingers" (07/30/2016)   Asthma    Bursitis of left shoulder    "just finished PT" (07/30/2016)   Chest pain    a. 2003 Abnl stress test-->Cath: nonobs CAD.   CHF (congestive heart failure) (HCC)    Chronic bronchitis (HCC)    Chronic kidney disease    stage 3   GERD (gastroesophageal reflux disease)    barrett's esophagus- Dr. Matthias Hughs   History of hiatal hernia    Hyperlipidemia    Hypertension    Hypothyroidism    Migraines    "sporatic; at least a few/year" (07/30/2016)   Mitral regurgitation    a. 07/2016 Echo: mild to mod MR;  b. 07/2016 TEE: mild MR.   Moderate aortic insufficiency    a. 07/2016 Echo: EF 55-60%, mod AI;  b. 07/2016 TEE: EF 50-55%, mod AI.   Obesity    s/p lap band surgery 6/10   OSA on CPAP    PAF (paroxysmal atrial fibrillation) (HCC)    a. 07/2016 TEE/DCCV: EF 50-55%, mild MR, mod AI, mild to mod TR, neg bubble study-->successful DCCV x1 w/ 120J.   PONV (postoperative nausea and vomiting)    Vitamin D deficiency    Past Surgical History:  Procedure Laterality Date   APPENDECTOMY     incidental   CARDIOVERSION N/A 08/01/2016  Procedure: CARDIOVERSION;  Surgeon: Jake Bathe, MD;  Location: [facility] ENDOSCOPY;  Service: Cardiovascular;  Laterality: N/A;   CARDIOVERSION N/A 06/24/2019   Procedure: CARDIOVERSION;  Surgeon: Thurmon Fair, MD;  Location: [facility] ENDOSCOPY;  Service: Cardiovascular;  Laterality: N/A;   CARDIOVERSION N/A 05/30/2020   Procedure: CARDIOVERSION;  Surgeon: Thurmon Fair, MD;  Location: [facility] ENDOSCOPY;  Service: Cardiovascular;  Laterality: N/A;   CARDIOVERSION N/A 05/09/2020   Procedure: CARDIOVERSION;  Surgeon: Little Ishikawa, MD;  Location: [facility] ENDOSCOPY;  Service: Cardiovascular;  Laterality: N/A;   CATARACT EXTRACTION W/ INTRAOCULAR LENS  IMPLANT, BILATERAL Bilateral ~ 2008-2017   left - right   COMBINED HYSTERECTOMY VAGINAL / OOPHORECTOMY / A&P REPAIR   1981   HERNIA REPAIR     INGUINAL HERNIA REPAIR Left 1952   LAPAROSCOPIC GASTRIC BANDING WITH HIATAL HERNIA REPAIR  09/2008   RIGHT/LEFT HEART CATH AND CORONARY ANGIOGRAPHY N/A 04/11/2022   Procedure: RIGHT/LEFT HEART CATH AND CORONARY ANGIOGRAPHY;  Surgeon: Corky Crafts, MD;  Location: [facility] INVASIVE CV LAB;  Service: Cardiovascular;  Laterality: N/A;   TEE WITHOUT CARDIOVERSION N/A 08/01/2016   Procedure: TRANSESOPHAGEAL ECHOCARDIOGRAM (TEE);  Surgeon: Jake Bathe, MD;  Location: [facility] ENDOSCOPY;  Service: Cardiovascular;  Laterality: N/A;   TONSILLECTOMY AND ADENOIDECTOMY  1974   VAGINAL HYSTERECTOMY  1981   "w/1 ovary"     A IV Location/Drains/Wounds Patient Lines/Drains/Airways Status     Active Line/Drains/Airways     Name Placement date Placement time Site Days   Peripheral IV 12/31/22 20 G Anterior;Left;Proximal Forearm 12/31/22  2205  Forearm  2            Intake/Output Last 24 hours  Intake/Output Summary (Last 24 hours) at 01/02/2023 0358 Last data filed at 01/01/2023 1842 Gross per 24 hour  Intake --  Output 1350 ml  Net -1350 ml    Labs/Imaging Results for orders placed or performed during the hospital encounter of 12/31/22 (from the past 48 hour(s))  CBC     Status: Abnormal   Collection Time: 12/31/22 10:34 PM  Result Value Ref Range   WBC 12.0 (H) 4.0 - 10.5 K/uL   RBC 4.23 3.87 - 5.11 MIL/uL   Hemoglobin 9.8 (L) 12.0 - 15.0 g/dL   HCT 78.2 (L) 95.6 - 21.3 %   MCV 80.6 80.0 - 100.0 fL   MCH 23.2 (L) 26.0 - 34.0 pg   MCHC 28.7 (L) 30.0 - 36.0 g/dL   RDW 08.6 (H) 57.8 - 46.9 %   Platelets 254 150 - 400 K/uL   nRBC 0.0 0.0 - 0.2 %    Comment: Performed at [facility], [address] [place], [place] [address]  Troponin I (High Sensitivity)     Status: None   Collection Time: 12/31/22 10:34 PM  Result Value Ref Range   Troponin I (High Sensitivity) 6 <18 ng/L    Comment: (NOTE) Elevated high sensitivity troponin I (hsTnI) values and  significant  changes across serial measurements may suggest ACS but many other  chronic and acute conditions are known to elevate hsTnI results.  Refer to the "Links" section for chest pain algorithms and additional  guidance. Performed at [facility], [address] [place], [place] [address]   Comprehensive metabolic panel     Status: Abnormal   Collection Time: 12/31/22 10:34 PM  Result Value Ref Range   Sodium 140 135 - 145 mmol/L   Potassium 4.4 3.5 - 5.1 mmol/L   Chloride 109  98 - 111 mmol/L   CO2 23 22 - 32 mmol/L   Glucose, Bld 125 (H) 70 - 99 mg/dL    Comment: Glucose reference range applies only to samples taken after fasting for at least 8 hours.   BUN 21 8 - 23 mg/dL   Creatinine, Ser 4.78 (H) 0.44 - 1.00 mg/dL   Calcium 8.1 (L) 8.9 - 10.3 mg/dL   Total Protein 6.8 6.5 - 8.1 g/dL   Albumin 3.5 3.5 - 5.0 g/dL   AST 18 15 - 41 U/L   ALT 17 0 - 44 U/L   Alkaline Phosphatase 95 38 - 126 U/L   Total Bilirubin 0.5 0.3 - 1.2 mg/dL   GFR, Estimated 46 (L) >60 mL/min    Comment: (NOTE) Calculated using the CKD-EPI Creatinine Equation (2021)    Anion gap 8 5 - 15    Comment: Performed at [facility], [address] [place], [place] [address]  Brain natriuretic peptide     Status: Abnormal   Collection Time: 12/31/22 10:34 PM  Result Value Ref Range   B Natriuretic Peptide 833.3 (H) 0.0 - 100.0 pg/mL    Comment: Performed at [facility], [address] [place], [place] [address]  Troponin I (High Sensitivity)     Status: None   Collection Time: 01/01/23  2:42 AM  Result Value Ref Range   Troponin I (High Sensitivity) 12 <18 ng/L    Comment: (NOTE) Elevated high sensitivity troponin I (hsTnI) values and significant  changes across serial measurements may suggest ACS but many other  chronic and acute conditions are known to elevate hsTnI results.  Refer to the "Links" section for chest pain algorithms and additional  guidance. Performed at [facility], [address], [place], [place] [address]   Resp panel by RT-PCR (RSV, Flu A&B, Covid) Anterior Nasal Swab     Status: None   Collection Time: 01/01/23  2:55 AM   Specimen: Anterior Nasal Swab  Result Value Ref Range   SARS Coronavirus 2 by RT PCR NEGATIVE NEGATIVE   Influenza A by PCR NEGATIVE NEGATIVE   Influenza B by PCR NEGATIVE NEGATIVE    Comment: (NOTE) The Xpert Xpress SARS-CoV-2/FLU/RSV plus assay is intended as an aid in the diagnosis of influenza from Nasopharyngeal swab specimens and should not be used as a sole basis for treatment. Nasal washings and aspirates are unacceptable for Xpert Xpress SARS-CoV-2/FLU/RSV testing.  Fact Sheet for Patients: BloggerCourse.com  Fact Sheet for Healthcare Providers: SeriousBroker.it  This test is not yet approved or cleared by the [place] FDA and has been authorized for detection and/or diagnosis of SARS-CoV-2 by FDA under an Emergency Use Authorization (EUA). This EUA will remain in effect (meaning this test can be used) for the duration of the COVID-19 declaration under Section 564(b)(1) of the Act, 21 U.S.C. section 360bbb-3(b)(1), unless the authorization is terminated or revoked.     Resp Syncytial Virus by PCR NEGATIVE NEGATIVE    Comment: (NOTE) Fact Sheet for Patients: BloggerCourse.com  Fact Sheet for Healthcare Providers: SeriousBroker.it  This test is not yet approved or cleared by the [place] FDA and has been authorized for detection and/or diagnosis of SARS-CoV-2 by FDA under an Emergency Use Authorization (EUA). This EUA will remain in effect (meaning this test can be used) for the duration of the COVID-19 declaration under Section 564(b)(1) of the Act, 21 U.S.C. section 360bbb-3(b)(1), unless the authorization is terminated or revoked.  Performed at [facility], [address] [place], [place] [address]   CBC     Status: Abnormal   Collection Time: 01/01/23  8:40 AM  Result Value Ref Range   WBC 8.9 4.0 - 10.5 K/uL   RBC 4.14 3.87 - 5.11 MIL/uL   Hemoglobin 9.6 (L) 12.0 - 15.0 g/dL   HCT 54.0 (L) 98.1 - 19.1 %   MCV 79.0 (L) 80.0 - 100.0 fL   MCH 23.2 (L) 26.0 - 34.0 pg   MCHC 29.4 (L) 30.0 - 36.0 g/dL   RDW 47.8 (H) 29.5 - 62.1 %   Platelets 263 150 - 400 K/uL   nRBC 0.0 0.0 - 0.2 %    Comment: Performed at [facility], [address], [place], [place] [address]  Basic metabolic panel     Status: Abnormal   Collection Time: 01/01/23  8:40 AM  Result Value Ref Range   Sodium 140 135 - 145 mmol/L   Potassium 4.0 3.5 - 5.1 mmol/L   Chloride 106 98 - 111 mmol/L   CO2 24 22 - 32 mmol/L   Glucose, Bld 106 (H) 70 - 99 mg/dL    Comment: Glucose reference range applies only to samples taken after fasting for at least 8 hours.   BUN 18 8 - 23 mg/dL   Creatinine, Ser 7.84 (H) 0.44 - 1.00 mg/dL   Calcium 8.2 (L) 8.9 - 10.3 mg/dL   GFR, Estimated 46 (L) >60 mL/min    Comment: (NOTE) Calculated using the CKD-EPI Creatinine Equation (2021)    Anion gap 10 5 - 15    Comment: Performed at [facility], [address], [place], [place] [address]   DG Chest 2 View  Result Date: 12/31/2022 CLINICAL DATA:  Chest pain and shortness of breath.  History of CHF EXAM: CHEST - 2 VIEW COMPARISON:  Radiographs 04/09/2022 FINDINGS: Stable cardiomegaly. Bilateral interstitial opacities and hazy opacities greatest in the lower lungs. No definite pleural effusion. No pneumothorax. IMPRESSION: Findings compatible with CHF. Electronically Signed   By: Minerva Fester M.D.   On: 12/31/2022 22:58    Pending Labs Unresulted Labs (From admission, onward)     Start     Ordered   01/02/23 0500  Comprehensive metabolic panel  Tomorrow morning,   R        01/01/23 0811   01/02/23 0500  CBC  Tomorrow morning,   R        01/01/23 0811             Vitals/Pain Today's Vitals   01/01/23 2230 01/02/23 0000 01/02/23 0133 01/02/23 0357  BP: 120/72 133/79  128/75  Pulse: 60 63 62 67  Resp: 12 17 13 16   Temp:  98.3 F (36.8 C)  98.2 F (36.8 C)  TempSrc:  Oral  Oral  SpO2: 99% 99% 94% 98%  PainSc:        Isolation Precautions No active isolations  Medications Medications  amiodarone (PACERONE) tablet 200 mg (200 mg Oral Given 01/01/23 1034)  apixaban (ELIQUIS) tablet 5 mg (5 mg Oral Given 01/01/23 2108)  budesonide (PULMICORT) nebulizer solution 0.25 mg (0.25 mg Inhalation Given 01/01/23 2053)  atorvastatin (LIPITOR) tablet 40 mg (40 mg Oral Given 01/01/23 1033)  levothyroxine (SYNTHROID) tablet 50 mcg (50 mcg Oral Given 01/01/23 0840)  metoprolol succinate (TOPROL-XL) 24 hr tablet 50 mg (50 mg Oral Given 01/01/23 1034)  pantoprazole (PROTONIX) EC tablet 40 mg (40 mg  Oral Given 01/01/23 1033)  furosemide (LASIX) injection 40 mg (40 mg Intravenous Given 01/01/23 1834)  fluticasone (FLONASE) 50 MCG/ACT nasal spray 2 spray (2 sprays Each Nare Given 01/01/23 1033)  acetaminophen (TYLENOL) tablet 650 mg (650 mg Oral Given 01/01/23 2108)  loratadine (CLARITIN) tablet 10 mg (10 mg Oral Given 01/01/23 1034)  sodium chloride flush (NS) 0.9 % injection 3 mL (3 mLs Intravenous Given 01/01/23 2109)  sodium chloride flush (NS) 0.9 % injection 3 mL (has no administration in time range)  0.9 %  sodium chloride infusion (has no administration in time range)  ondansetron (ZOFRAN) tablet 4 mg (has no administration in time range)    Or  ondansetron (ZOFRAN) injection 4 mg (has no administration in time range)  polyethylene glycol (MIRALAX / GLYCOLAX) packet 17 g (has no administration in time range)  albuterol (PROVENTIL) (2.5 MG/3ML) 0.083% nebulizer solution 2.5 mg (has no administration in time range)  hydrALAZINE (APRESOLINE) injection 10 mg (has no administration in time range)  HYDROcodone-acetaminophen (NORCO/VICODIN) 5-325 MG per tablet 1-2  tablet (has no administration in time range)  furosemide (LASIX) injection 40 mg (40 mg Intravenous Given 12/31/22 2259)    Mobility walks     Focused Assessments Cardiac Assessment Handoff:    No results found for: "CKTOTAL", "CKMB", "CKMBINDEX", "TROPONINI" No results found for: "DDIMER" Does the Patient currently have chest pain? No   , Neuro Assessment Handoff:  Swallow screen pass? Yes          Neuro Assessment: Within Defined Limits Neuro Checks:      Has TPA been given? No If patient is a Neuro Trauma and patient is going to OR before floor call report to 4N Charge nurse: [phone] or [phone]  , Pulmonary Assessment Handoff:  Lung sounds:   O2 Device: Nasal Cannula O2 Flow Rate (L/min): 4 L/min    R Recommendations: See Admitting Provider Note  Report given to:   Additional Notes:

## 2023-01-02 NOTE — Progress Notes (Signed)
PROGRESS NOTE    Leslie [name]  XBM:[id number] DOB: May 25, 1945 DOA: 12/31/2022 PCP: Shon Hale, MD   Brief Narrative: 87 female with medical history significant of hypertension, morbid obesity, CKD, A-fib on Eliquis, diastolic heart failure grade 2 , presenting with sudden onset shortness of breath in the setting of diet indiscretion found to have acute diastolic heart failure exacerbation and hypoxic respiratory failure.   Assessment & Plan:   Principal Problem:   HF (heart failure) (HCC) Active Problems:   Essential hypertension, benign   Morbid obesity (HCC)   Hypothyroidism   Chronic kidney disease, stage IIIa   Acute CHF (congestive heart failure) (HCC)   Asthma, chronic   Acute hypoxic respiratory failure (HCC)  1-Acute Hypoxic Respiratory failure in the setting of Acute Diastolic heart failure exacerbation: Patient presented with shortness of breath, weight gain, elevated BNP, chest x-ray with finding concerning for heart failure exacerbation. Continue with IV Lasix Strict I's and O's Daily weight Cardiology consulted Holding entresto as diuresing with lasix.  Negative 2 L. Improving. Labs pending.    2-History of A fib Continue with eliquis, amiodarone, metoprolol.      3-HLD; Continue with lipitor.    4-GERD; PPI.   5-Hypothyroidism; Continue with synthroid.   6-Chronic Bronchitis:  Continue pulmicort, on Ellipta at home   7-HTN; IV lasix.  PRN Hydrazine. Metoprolol/   8-CKD stage IIIa; monitor.        Estimated body mass index is 41.49 kg/m as calculated from the following:   Height as of 08/21/22: 5\' 5"  (1.651 m).   Weight as of this encounter: 113.1 kg.   DVT prophylaxis: Eliquis Code Status: Full code Family Communication: Disposition Plan:  Status is: Inpatient Remains inpatient appropriate because: management of HF    Consultants:  Cardiology   Procedures:  None  Antimicrobials:    Subjective: She is breathing  better. Off oxygen this am.    Objective: Vitals:   01/02/23 0357 01/02/23 0500 01/02/23 0513 01/02/23 0545  BP: 128/75  (!) 144/89   Pulse: 67  64 62  Resp: 16  16   Temp: 98.2 F (36.8 C)  98 F (36.7 C)   TempSrc: Oral  Oral   SpO2: 98%  95% 95%  Weight:  113.1 kg      Intake/Output Summary (Last 24 hours) at 01/02/2023 0723 Last data filed at 01/02/2023 0272 Gross per 24 hour  Intake --  Output 750 ml  Net -750 ml   Filed Weights   01/02/23 0500  Weight: 113.1 kg    Examination:  General exam: Appears calm and comfortable  Respiratory system: crackles BL Cardiovascular system: S1 & S2 heard, RRR. No JVD, murmurs, rubs, gallops or clicks. No pedal edema. Gastrointestinal system: Abdomen is nondistended, soft and nontender. No organomegaly or masses felt. Normal bowel sounds heard. Central nervous system: Alert and oriented.  Extremities: trace edema  Data Reviewed: I have personally reviewed following labs and imaging studies  CBC: Recent Labs  Lab 12/31/22 2234 01/01/23 0840  WBC 12.0* 8.9  HGB 9.8* 9.6*  HCT 34.1* 32.7*  MCV 80.6 79.0*  PLT 254 263   Basic Metabolic Panel: Recent Labs  Lab 12/31/22 2234 01/01/23 0840  NA 140 140  K 4.4 4.0  CL 109 106  CO2 23 24  GLUCOSE 125* 106*  BUN 21 18  CREATININE 1.22* 1.21*  CALCIUM 8.1* 8.2*   GFR: Estimated Creatinine Clearance: 49.6 mL/min (A) (by C-G formula based on SCr of  1.21 mg/dL (H)). Liver Function Tests: Recent Labs  Lab 12/31/22 2234  AST 18  ALT 17  ALKPHOS 95  BILITOT 0.5  PROT 6.8  ALBUMIN 3.5   No results for input(s): "LIPASE", "AMYLASE" in the last 168 hours. No results for input(s): "AMMONIA" in the last 168 hours. Coagulation Profile: No results for input(s): "INR", "PROTIME" in the last 168 hours. Cardiac Enzymes: No results for input(s): "CKTOTAL", "CKMB", "CKMBINDEX", "TROPONINI" in the last 168 hours. BNP (last 3 results) No results for input(s): "PROBNP" in the  last 8760 hours. HbA1C: No results for input(s): "HGBA1C" in the last 72 hours. CBG: No results for input(s): "GLUCAP" in the last 168 hours. Lipid Profile: No results for input(s): "CHOL", "HDL", "LDLCALC", "TRIG", "CHOLHDL", "LDLDIRECT" in the last 72 hours. Thyroid Function Tests: No results for input(s): "TSH", "T4TOTAL", "FREET4", "T3FREE", "THYROIDAB" in the last 72 hours. Anemia Panel: No results for input(s): "VITAMINB12", "FOLATE", "FERRITIN", "TIBC", "IRON", "RETICCTPCT" in the last 72 hours. Sepsis Labs: No results for input(s): "PROCALCITON", "LATICACIDVEN" in the last 168 hours.  Recent Results (from the past 240 hour(s))  Resp panel by RT-PCR (RSV, Flu A&B, Covid) Anterior Nasal Swab     Status: None   Collection Time: 01/01/23  2:55 AM   Specimen: Anterior Nasal Swab  Result Value Ref Range Status   SARS Coronavirus 2 by RT PCR NEGATIVE NEGATIVE Final   Influenza A by PCR NEGATIVE NEGATIVE Final   Influenza B by PCR NEGATIVE NEGATIVE Final    Comment: (NOTE) The Xpert Xpress SARS-CoV-2/FLU/RSV plus assay is intended as an aid in the diagnosis of influenza from Nasopharyngeal swab specimens and should not be used as a sole basis for treatment. Nasal washings and aspirates are unacceptable for Xpert Xpress SARS-CoV-2/FLU/RSV testing.  Fact Sheet for Patients: BloggerCourse.com  Fact Sheet for Healthcare Providers: SeriousBroker.it  This test is not yet approved or cleared by the [place] FDA and has been authorized for detection and/or diagnosis of SARS-CoV-2 by FDA under an Emergency Use Authorization (EUA). This EUA will remain in effect (meaning this test can be used) for the duration of the COVID-19 declaration under Section 564(b)(1) of the Act, 21 U.S.C. section 360bbb-3(b)(1), unless the authorization is terminated or revoked.     Resp Syncytial Virus by PCR NEGATIVE NEGATIVE Final    Comment:  (NOTE) Fact Sheet for Patients: BloggerCourse.com  Fact Sheet for Healthcare Providers: SeriousBroker.it  This test is not yet approved or cleared by the [place] FDA and has been authorized for detection and/or diagnosis of SARS-CoV-2 by FDA under an Emergency Use Authorization (EUA). This EUA will remain in effect (meaning this test can be used) for the duration of the COVID-19 declaration under Section 564(b)(1) of the Act, 21 U.S.C. section 360bbb-3(b)(1), unless the authorization is terminated or revoked.  Performed at [facility], [address], [place], [place] [address]          Radiology Studies: DG Chest 2 View  Result Date: 12/31/2022 CLINICAL DATA:  Chest pain and shortness of breath.  History of CHF EXAM: CHEST - 2 VIEW COMPARISON:  Radiographs 04/09/2022 FINDINGS: Stable cardiomegaly. Bilateral interstitial opacities and hazy opacities greatest in the lower lungs. No definite pleural effusion. No pneumothorax. IMPRESSION: Findings compatible with CHF. Electronically Signed   By: Minerva Fester M.D.   On: 12/31/2022 22:58        Scheduled Meds:  amiodarone  200 mg Oral Daily   apixaban  5 mg Oral  BID   atorvastatin  40 mg Oral Daily   budesonide  0.25 mg Inhalation BID   fluticasone  2 spray Each Nare Daily   furosemide  40 mg Intravenous BID   [START ON 01/03/2023] influenza vaccine adjuvanted  0.5 mL Intramuscular Tomorrow-1000   levothyroxine  50 mcg Oral QAC breakfast   loratadine  10 mg Oral Daily   metoprolol succinate  50 mg Oral Daily   pantoprazole  40 mg Oral Daily   sodium chloride flush  3 mL Intravenous Q12H   Continuous Infusions:  sodium chloride       LOS: 1 day    Time spent: 35 minutes    Dangelo Guzzetta A Valborg Friar, MD [facility]   If 7PM-7AM, please contact night-coverage [email or web address]  01/02/2023, 7:23 AM

## 2023-01-03 DIAGNOSIS — J9621 Acute and chronic respiratory failure with hypoxia: Secondary | ICD-10-CM | POA: Diagnosis not present

## 2023-01-03 DIAGNOSIS — I5033 Acute on chronic diastolic (congestive) heart failure: Secondary | ICD-10-CM | POA: Diagnosis not present

## 2023-01-03 LAB — BASIC METABOLIC PANEL
Anion gap: 10 (ref 5–15)
BUN: 26 mg/dL — ABNORMAL HIGH (ref 8–23)
CO2: 25 mmol/L (ref 22–32)
Calcium: 8 mg/dL — ABNORMAL LOW (ref 8.9–10.3)
Chloride: 104 mmol/L (ref 98–111)
Creatinine, Ser: 1.55 mg/dL — ABNORMAL HIGH (ref 0.44–1.00)
GFR, Estimated: 35 mL/min — ABNORMAL LOW (ref >60)
Glucose, Bld: 73 mg/dL (ref 70–99)
Potassium: 3.7 mmol/L (ref 3.5–5.1)
Sodium: 139 mmol/L (ref 135–145)

## 2023-01-03 LAB — MAGNESIUM: Magnesium: 2.4 mg/dL (ref 1.7–2.4)

## 2023-01-03 LAB — GLUCOSE, CAPILLARY: Glucose-Capillary: 96 mg/dL (ref 70–99)

## 2023-01-03 MED ORDER — METOPROLOL SUCCINATE ER 50 MG PO TB24: 50.0000 mg | ORAL_TABLET | Freq: Every day | ORAL | 0 refills | Status: DC

## 2023-01-03 MED ORDER — FUROSEMIDE 40 MG PO TABS: 40.0000 mg | ORAL_TABLET | ORAL | 3 refills | Status: DC | PRN

## 2023-01-03 MED ORDER — GUAIFENESIN ER 600 MG PO TB12
600.0000 mg | ORAL_TABLET | Freq: Two times a day (BID) | ORAL | Status: DC
  Administered 2023-01-03: 600 mg via ORAL
  Filled 2023-01-03: qty 1

## 2023-01-03 MED ORDER — GUAIFENESIN ER 600 MG PO TB12: 600.0000 mg | ORAL_TABLET | Freq: Two times a day (BID) | ORAL | 0 refills | Status: DC

## 2023-01-03 NOTE — Consult Note (Signed)
[facility] Inpatient Consult   01/03/2023  Leslie [name] 08-22-1945 [id number]  [facility] [[facility]]  Accountable Care Organization [ACO] Patient: [employer]  Primary Care Provider: Shon Hale, MD , [facility] this provider is listed for the transition of care follow up appointments and calls.   [facility] Liaison met patient at bedside at [facility]. Patient endorses provider.  She anticipates returning home today or tomorrow due to awaiting on blood work. She endorses follow up with anticipated.   The patient was screened for hospitalization with  medium risk score for unplanned readmission risk 1 hospital admissions in 6 months. The patient was assessed for potential [facility]) Care Management service needs for post hospital transition for care coordination. Review of patient's electronic medical record reveals patient is with Acute diastolic HF with acute respiratory failure. Explained post hospital follow up and assessed for needs.  She endorses ongoing follow up with PCP.  Plan: [facility] Liaison will continue to follow progress and disposition to asess for post hospital community care coordination/management needs.  Referral request for community care coordination: anticipate community [facility]   Community Care Management/Population Health does not replace or interfere with any arrangements made by the Inpatient Transition of Care team.   For questions contact:   Charlesetta Shanks, RN, BSN, CCM [place]  [facility], [facility] Liaison Direct Dial: [phone] or secure chat Website: [email or web address]

## 2023-01-03 NOTE — TOC Transition Note (Signed)
Transition of Care [facility]) - CM/SW Discharge Note   Patient Details  Name: Leslie [name] MRN: [id number] Date of Birth: November 14, 1945  Transition of Care [facility]) CM/SW Contact:  Leone Haven, RN Phone Number: 01/03/2023, 2:32 PM   Clinical Narrative:    Patient is for possible dc today, she has no needs.    Final next level of care: Home/Self Care Barriers to Discharge: No Barriers Identified   Patient Goals and CMS Choice   Choice offered to / list presented to : NA  Discharge Placement                         Discharge Plan and Services Additional resources added to the After Visit Summary for   In-house Referral: NA Discharge Planning Services: CM Consult Post Acute Care Choice: NA          DME Arranged: N/A DME Agency: NA       HH Arranged: NA          Social Determinants of Health (SDOH) Interventions SDOH Screenings   Food Insecurity: No Food Insecurity (01/02/2023)  Housing: Low Risk  (01/02/2023)  Transportation Needs: No Transportation Needs (01/02/2023)  Utilities: Not At Risk (01/02/2023)  Alcohol Screen: Low Risk  (04/12/2022)  Depression (PHQ2-9): Low Risk  (08/23/2022)  Financial Resource Strain: Medium Risk (04/12/2022)  Tobacco Use: Medium Risk (12/12/2022)   Received from [facility]     Readmission Risk Interventions     No data to display

## 2023-01-03 NOTE — Discharge Summary (Signed)
Physician Discharge Summary   Patient: Leslie [name] MRN: [id number] DOB: [birth year]  Admit date:     12/31/2022  Discharge date: 01/03/23  Discharge Physician: Alba Cory   PCP: Shon Hale, MD   Recommendations at discharge:   Needs Bmet to follow renal function.  Further adjustment of medications for heart failure.   Discharge Diagnoses: Principal Problem:   HF (heart failure) (HCC) Active Problems:   Essential hypertension, benign   Morbid obesity (HCC)   Hypothyroidism   Chronic kidney disease, stage IIIa   Acute CHF (congestive heart failure) (HCC)   Asthma, chronic   Acute hypoxic respiratory failure (HCC)  Resolved Problems:   * No resolved hospital problems. *  Hospital Course: 17 female with medical history significant of hypertension, morbid obesity, CKD, A-fib on Eliquis, diastolic heart failure grade 2 , presenting with sudden onset shortness of breath in the setting of diet indiscretion found to have acute diastolic heart failure exacerbation and hypoxic respiratory failure.   Assessment and Plan: 1-Acute Hypoxic Respiratory failure in the setting of Acute Diastolic heart failure exacerbation: Patient presented with shortness of breath, weight gain, elevated BNP, chest x-ray with finding concerning for heart failure exacerbation. Treated  with IV Lasix BID. Weight down to 247 Strict I's and O's Daily weight 249------247 Cardiology consulted Holding entresto at discharge because of increase cr.  Negative 2 L. Stable for discharge. Currently on RA. Discharge on lasix PRN.    2-History of A fib Continue with eliquis, amiodarone, metoprolol.      3-HLD; Continue with lipitor.    4-GERD; PPI.    5-Hypothyroidism; Continue with synthroid.    6-Chronic Bronchitis:  Continue pulmicort, on Ellipta at home   7-HTN; I  PRN Hydrazine. Metoprolol/    8-CKD stage IIIa; monitor.   previous Cr 1.3---1.1 Cr peak to 1.5 in setting diuretics.  Hold Entresto          Consultants: Cardiology  Procedures performed: none Disposition: Home Diet recommendation:  Discharge Diet Orders (From admission, onward)     Start     Ordered   01/03/23 0000  Diet - low sodium heart healthy        01/03/23 1442           Cardiac diet DISCHARGE MEDICATION: Allergies as of 01/03/2023       Reactions   Clindamycin Hcl    Lip and face swelling   Augmentin [amoxicillin-pot Clavulanate] Diarrhea   Did it involve swelling of the face/tongue/throat, SOB, or low BP? No Did it involve sudden or severe rash/hives, skin peeling, or any reaction on the inside of your mouth or nose? No Did you need to seek medical attention at a hospital or doctor's office? No When did it last happen?      10 + years If all above answers are "NO", may proceed with cephalosporin use.   Codeine Nausea Only, Other (See Comments)   Hyperactivity   Demerol [meperidine] Nausea And Vomiting   Sulfa Antibiotics Rash        Medication List     STOP taking these medications    Entresto 49-51 MG Generic drug: sacubitril-valsartan       TAKE these medications    acetaminophen 650 MG CR tablet Commonly known as: TYLENOL Take 1,300 mg by mouth every 8 (eight) hours as needed for pain.   ALIGN PREBIOTIC-PROBIOTIC PO Take 2 capsules by mouth daily. Gummy   amiodarone 200 MG tablet Commonly known as: PACERONE  Take 1 tablet (200 mg total) by mouth daily.   apixaban 5 MG Tabs tablet Commonly known as: Eliquis Take 1 tablet (5 mg total) by mouth 2 (two) times daily.   Arnuity Ellipta 50 MCG/ACT Aepb Generic drug: Fluticasone Furoate Inhale 1 puff into the lungs at bedtime.   atorvastatin 40 MG tablet Commonly known as: LIPITOR Take 1 tablet (40 mg total) by mouth daily.   Centrum MultiGummies Chew Chew 1 tablet by mouth daily.   cholecalciferol 1000 units tablet Commonly known as: VITAMIN D Take 1,000 Units by mouth daily.   diclofenac  Sodium 1 % Gel Commonly known as: VOLTAREN Apply 1 application topically 2 (two) times daily.   fexofenadine 180 MG tablet Commonly known as: ALLEGRA Take 180 mg by mouth daily.   fluticasone 50 MCG/ACT nasal spray Commonly known as: FLONASE Place 2 sprays into both nostrils at bedtime.   furosemide 40 MG tablet Commonly known as: LASIX Take 1 tablet (40 mg total) by mouth as needed. For weight greater than 2-3 pounds in 24 hours What changed: additional instructions   guaiFENesin 600 MG 12 hr tablet Commonly known as: MUCINEX Take 1 tablet (600 mg total) by mouth 2 (two) times daily.   levothyroxine 50 MCG tablet Commonly known as: SYNTHROID Take 50 mcg by mouth daily before breakfast.   magnesium oxide 400 (240 Mg) MG tablet Commonly known as: MAG-OX Take 1 tablet (400 mg total) by mouth daily.   metoprolol succinate 50 MG 24 hr tablet Commonly known as: TOPROL-XL Take 1 tablet (50 mg total) by mouth daily. Take with or immediately following a meal. Start taking on: January 04, 2023 What changed:  medication strength how much to take how to take this when to take this additional instructions   metroNIDAZOLE 0.75 % cream Commonly known as: METROCREAM Apply 1 application  topically daily.   mometasone 0.1 % cream Commonly known as: ELOCON Apply 1 application  topically 2 (two) times daily as needed (skin irritation).   omeprazole 20 MG capsule Commonly known as: PRILOSEC Take 40 mg by mouth daily before breakfast.   OSTEO BI-FLEX REGULAR STRENGTH PO Take 1 tablet by mouth daily.        Follow-up Information     Shon Hale, MD Follow up in 1 week(s).   Specialty: Family Medicine Contact information: [address] [place] [address] [phone]                Discharge Exam: Ceasar Mons Weights   01/02/23 0500 01/03/23 0413  Weight: 113.1 kg 112.4 kg   General; NAD  Condition at discharge: stable  The results of  significant diagnostics from this hospitalization (including imaging, microbiology, ancillary and laboratory) are listed below for reference.   Imaging Studies: DG Chest 2 View  Result Date: 12/31/2022 CLINICAL DATA:  Chest pain and shortness of breath.  History of CHF EXAM: CHEST - 2 VIEW COMPARISON:  Radiographs 04/09/2022 FINDINGS: Stable cardiomegaly. Bilateral interstitial opacities and hazy opacities greatest in the lower lungs. No definite pleural effusion. No pneumothorax. IMPRESSION: Findings compatible with CHF. Electronically Signed   By: Minerva Fester M.D.   On: 12/31/2022 22:58    Microbiology: Results for orders placed or performed during the hospital encounter of 12/31/22  Resp panel by RT-PCR (RSV, Flu A&B, Covid) Anterior Nasal Swab     Status: None   Collection Time: 01/01/23  2:55 AM   Specimen: Anterior Nasal Swab  Result Value Ref Range Status  SARS Coronavirus 2 by RT PCR NEGATIVE NEGATIVE Final   Influenza A by PCR NEGATIVE NEGATIVE Final   Influenza B by PCR NEGATIVE NEGATIVE Final    Comment: (NOTE) The Xpert Xpress SARS-CoV-2/FLU/RSV plus assay is intended as an aid in the diagnosis of influenza from Nasopharyngeal swab specimens and should not be used as a sole basis for treatment. Nasal washings and aspirates are unacceptable for Xpert Xpress SARS-CoV-2/FLU/RSV testing.  Fact Sheet for Patients: BloggerCourse.com  Fact Sheet for Healthcare Providers: SeriousBroker.it  This test is not yet approved or cleared by the [place] FDA and has been authorized for detection and/or diagnosis of SARS-CoV-2 by FDA under an Emergency Use Authorization (EUA). This EUA will remain in effect (meaning this test can be used) for the duration of the COVID-19 declaration under Section 564(b)(1) of the Act, 21 U.S.C. section 360bbb-3(b)(1), unless the authorization is terminated or revoked.     Resp Syncytial Virus  by PCR NEGATIVE NEGATIVE Final    Comment: (NOTE) Fact Sheet for Patients: BloggerCourse.com  Fact Sheet for Healthcare Providers: SeriousBroker.it  This test is not yet approved or cleared by the [place] FDA and has been authorized for detection and/or diagnosis of SARS-CoV-2 by FDA under an Emergency Use Authorization (EUA). This EUA will remain in effect (meaning this test can be used) for the duration of the COVID-19 declaration under Section 564(b)(1) of the Act, 21 U.S.C. section 360bbb-3(b)(1), unless the authorization is terminated or revoked.  Performed at [facility], [address] [place], [place] [address]     Labs: CBC: Recent Labs  Lab 12/31/22 2234 01/01/23 0840 01/02/23 1103  WBC 12.0* 8.9 8.7  HGB 9.8* 9.6* 9.8*  HCT 34.1* 32.7* 32.1*  MCV 80.6 79.0* 77.2*  PLT 254 263 262   Basic Metabolic Panel: Recent Labs  Lab 12/31/22 2234 01/01/23 0840 01/02/23 1103 01/03/23 0952  NA 140 140 139 139  K 4.4 4.0 3.6 3.7  CL 109 106 101 104  CO2 23 24 25 25   GLUCOSE 125* 106* 89 73  BUN 21 18 22  26*  CREATININE 1.22* 1.21* 1.40* 1.55*  CALCIUM 8.1* 8.2* 8.3* 8.0*  MG  --   --  1.6* 2.4   Liver Function Tests: Recent Labs  Lab 12/31/22 2234 01/02/23 1103  AST 18 21  ALT 17 18  ALKPHOS 95 82  BILITOT 0.5 0.9  PROT 6.8 6.6  ALBUMIN 3.5 3.5   CBG: Recent Labs  Lab 01/02/23 1116 01/03/23 1105  GLUCAP 89 96    Discharge time spent: greater than 30 minutes.  Signed: Alba Cory, MD [facility] 01/03/2023

## 2023-01-03 NOTE — Discharge Instructions (Signed)
Stop taking entresto. Your PCP /cardiology will advised you when to resume   Take lasix if your weight increase more than 2 -3 pounds in 24 hours.

## 2023-01-03 NOTE — Progress Notes (Signed)
   Patient Name: Leslie [name] Date of Encounter: 01/03/2023 [facility] Cardiologist: Thurmon Fair, MD   Interval Summary  .    Patient feels well this AM. Breathing has improved. No ankle edema. Hopeful that she could go home today   Vital Signs .    Vitals:   01/02/23 2350 01/03/23 0413 01/03/23 0730 01/03/23 0822  BP: 134/69 129/62 130/81   Pulse: 62 67 62   Resp: 17 16 19    Temp: 98.3 F (36.8 C) 98 F (36.7 C) 97.7 F (36.5 C)   TempSrc: Oral Oral Oral   SpO2: 99% 97% 99% 98%  Weight:  112.4 kg      Intake/Output Summary (Last 24 hours) at 01/03/2023 1031 Last data filed at 01/03/2023 0835 Gross per 24 hour  Intake 240 ml  Output 1600 ml  Net -1360 ml      01/03/2023    4:13 AM 01/02/2023    5:00 AM 08/21/2022    2:15 PM  Last 3 Weights  Weight (lbs) 247 lb 12.8 oz 249 lb 4.8 oz 253 lb 8.5 oz  Weight (kg) 112.4 kg 113.082 kg 115 kg      Telemetry/ECG    NSR, HR int he 60s-70s - Personally Reviewed  Physical Exam .   GEN: No acute distress.  Sitting comfortably on the side of the bed  Neck: No JVD Cardiac: RRR, no murmurs, rubs, or gallops. Radial pulses 2+ bilaterally  Respiratory: Clear to auscultation bilaterally. Normal work of breathing on room air  GI: Soft, nontender, non-distended  MS: No edema in BLE   Assessment & Plan .     Acute on Chronic HFpEF  Acute Hypoxic respiratory failure  Moderate MR  - Most recent echocardiogram from 05/2022 showed EF 65-70%, no regional wall motion abnormalities, grade II DD, normal RV function, moderate MR  - Patient presented with dyspnea, orthopnea. Notes that she did not follow low sodium diet over the weekend. BNP elevated to 833. CXR with findings compatible with CHF  - Currently on IV lasix 40 mg BID- output 2.2 L urine yesterday. Renal function pending this AM  - Stop IV lasix. Anticipate starting PO lasix tomorrow  - Consider starting SGLT2i given patient's diastolic dysfunction- renal function  pending this AM    Persistent Atrial Fibrillation  - Currently maintaining NSR per telemetry  - Continue amiodarone 200 mg daily, metoprolol succinate 50 mg daily, eliquis 5 mg BID    HTN  - Continue metoprolol succinate 40 mg daily    OSA - encouraged compliance with CPAP   For questions or updates, please contact [facility] Please consult [email or web address] for contact info under        Signed, Jonita Albee, PA-C

## 2023-01-03 NOTE — Progress Notes (Signed)
Pt discharged to home in stable condition. All discharge instructions reviewed with and given to pt. Pt gives complete verbal understanding of all of the above. Pt transported off unit via wheelchair with staff x 1 at chairside to private vehicle for transport home.

## 2023-01-03 NOTE — Progress Notes (Addendum)
Heart Failure Navigator Progress Note  Assessed for Heart & Vascular TOC clinic readiness.  Patient EF 65-70%, Has a scheduled [facility] appointment 01/27/2023 @ 10:55 am.   Navigator available for reassessment of patient.   Rhae Hammock, BSN, Scientist, clinical (histocompatibility and immunogenetics) Only

## 2023-01-03 NOTE — TOC Initial Note (Signed)
Transition of Care [facility]) - Initial/Assessment Note    Patient Details  Name: Leslie [name] MRN: [id number] Date of Birth: 08-07-1945  Transition of Care [facility]) CM/SW Contact:    Leone Haven, RN Phone Number: 01/03/2023, 2:30 PM  Clinical Narrative:                 From home alone, has PCP and insurance on file, states has no HH services in place at this time or DME at home.  States brother will transport her home at [employer] and family is support system, states gets medications from [place] on [address].  Pta self ambulatory.   Expected Discharge Plan: Home/Self Care Barriers to Discharge: No Barriers Identified   Patient Goals and CMS Choice Patient states their goals for this hospitalization and ongoing recovery are:: return home   Choice offered to / list presented to : NA      Expected Discharge Plan and Services In-house Referral: NA Discharge Planning Services: CM Consult Post Acute Care Choice: NA Living arrangements for the past 2 months: Single Family Home                 DME Arranged: N/A DME Agency: NA       HH Arranged: NA          Prior Living Arrangements/Services Living arrangements for the past 2 months: Single Family Home Lives with:: Self Patient language and need for interpreter reviewed:: Yes Do you feel safe going back to the place where you live?: Yes      Need for Family Participation in Patient Care: Yes (Comment) Care giver support system in place?: Yes (comment)   Criminal Activity/Legal Involvement Pertinent to Current Situation/Hospitalization: No - Comment as needed  Activities of Daily Living Home Assistive Devices/Equipment: None ADL Screening (condition at time of admission) Patient's cognitive ability adequate to safely complete daily activities?: Yes Is the patient deaf or have difficulty hearing?: No Does the patient have difficulty seeing, even when wearing glasses/contacts?: No Does the patient have difficulty  concentrating, remembering, or making decisions?: No Patient able to express need for assistance with ADLs?: Yes Does the patient have difficulty dressing or bathing?: No Independently performs ADLs?: Yes (appropriate for developmental age) Does the patient have difficulty walking or climbing stairs?: No Weakness of Legs: None Weakness of Arms/Hands: None  Permission Sought/Granted Permission sought to share information with : Case Manager Permission granted to share information with : Yes, Verbal Permission Granted              Emotional Assessment   Attitude/Demeanor/Rapport: Engaged Affect (typically observed): Appropriate Orientation: : Oriented to Self, Oriented to Place, Oriented to  Time, Oriented to Situation Alcohol / Substance Use: Not Applicable Psych Involvement: No (comment)  Admission diagnosis:  HF (heart failure) (HCC) [I50.9] Acute and chronic respiratory failure with hypoxia (HCC) [J96.21] Acute on chronic congestive heart failure, unspecified heart failure type (HCC) [I50.9] Patient Active Problem List   Diagnosis Date Noted   HF (heart failure) (HCC) 01/01/2023   Acute hypoxic respiratory failure (HCC) 01/01/2023   Asthma, chronic 04/11/2022   Elevated troponin 04/10/2022   Acute on chronic combined systolic and diastolic CHF (congestive heart failure) (HCC) 04/09/2022   Paroxysmal atrial fibrillation (HCC) 10/25/2021   Hypercoagulable state due to persistent atrial fibrillation (HCC) 10/25/2021   Diastolic dysfunction 10/25/2021   Anemia 06/21/2020   Leukocytosis 06/21/2020   Acute CHF (HCC) 05/31/2020   Acute CHF (congestive heart failure) (HCC) 05/30/2020  OSA (obstructive sleep apnea) 11/16/2016   Chronic anticoagulation 09/17/2016   Mitral valve insufficiency    Aortic valve regurgitation    Persistent atrial fibrillation (HCC) 07/30/2016   Essential hypertension, benign 06/07/2013   Unspecified vitamin D deficiency 06/07/2013   Pure  hypercholesterolemia 06/07/2013   Morbid obesity (HCC) 06/07/2013   Hypothyroidism 06/07/2013   Chronic kidney disease, stage IIIa 06/07/2013   Contact dermatitis and other eczema, due to unspecified cause 06/07/2013   PCP:  Shon Hale, MD Pharmacy:   [facility] [phone] [place], [place] - [address] AT [facility] [address] [facility] [address] [place] [address] Phone: [phone] Fax: [phone]     Social Determinants of Health (SDOH) Social History: SDOH Screenings   Food Insecurity: No Food Insecurity (01/02/2023)  Housing: Low Risk  (01/02/2023)  Transportation Needs: No Transportation Needs (01/02/2023)  Utilities: Not At Risk (01/02/2023)  Alcohol Screen: Low Risk  (04/12/2022)  Depression (PHQ2-9): Low Risk  (08/23/2022)  Financial Resource Strain: Medium Risk (04/12/2022)  Tobacco Use: Medium Risk (12/12/2022)   Received from [facility]   SDOH Interventions:     Readmission Risk Interventions     No data to display

## 2023-01-06 DIAGNOSIS — J9611 Chronic respiratory failure with hypoxia: Secondary | ICD-10-CM | POA: Diagnosis not present

## 2023-01-06 DIAGNOSIS — I5042 Chronic combined systolic (congestive) and diastolic (congestive) heart failure: Secondary | ICD-10-CM | POA: Diagnosis not present

## 2023-01-10 DIAGNOSIS — Z6841 Body Mass Index (BMI) 40.0 and over, adult: Secondary | ICD-10-CM | POA: Diagnosis not present

## 2023-01-10 DIAGNOSIS — I5033 Acute on chronic diastolic (congestive) heart failure: Secondary | ICD-10-CM | POA: Diagnosis not present

## 2023-01-10 DIAGNOSIS — Z79899 Other long term (current) drug therapy: Secondary | ICD-10-CM | POA: Diagnosis not present

## 2023-01-10 DIAGNOSIS — N1832 Chronic kidney disease, stage 3b: Secondary | ICD-10-CM | POA: Diagnosis not present

## 2023-01-10 DIAGNOSIS — I130 Hypertensive heart and chronic kidney disease with heart failure and stage 1 through stage 4 chronic kidney disease, or unspecified chronic kidney disease: Secondary | ICD-10-CM | POA: Diagnosis not present

## 2023-01-14 NOTE — Progress Notes (Signed)
pressure gets a bit elevated, she feels like her heart is "achy".  She denies chest pain, dyspnea on exertion.   ROS: Denies shortness of breath, syncope, near syncope, dizziness, ankle swelling, orthopnea.  Does have palpitations, elevated heart rates  Studies Reviewed: .   Cardiac Studies & Procedures   CARDIAC  CATHETERIZATION  CARDIAC CATHETERIZATION 04/11/2022  Narrative   LV end diastolic pressure is mildly elevated.   Hemodynamic findings consistent with mild pulmonary hypertension.   There is no aortic valve stenosis.   On 4L [facility]: Ao sat 92%, PA sat 62%, PA pressure 48/20, mean PA pressure 32 mmHg, mean right atrial pressure 8 mmHg, mean pulmonary capillary wedge pressure 15 mmHg, cardiac output 7.09 L/min, cardiac index 3.13.  Mild, nonobstructive coronary artery disease.  Mild pulmonary artery hypertension.  Nonischemic cardiomyopathy.  Continue medical therapy.  Findings Coronary Findings Diagnostic  Dominance: Right  Left Anterior Descending The vessel exhibits minimal luminal irregularities.  Left Circumflex The vessel exhibits minimal luminal irregularities.  Right Coronary Artery The vessel exhibits minimal luminal irregularities.  Intervention  No interventions have been documented.   STRESS TESTS  MYOCARDIAL PERFUSION IMAGING 05/25/2020  Narrative  There was no ST segment deviation noted during stress.  The study is normal.  This is a low risk study.  Normal pharmacologic nuclear stress test with no evidence for prior infarct or ischemia. LVEF not calculated due to atrial fibrillation.   ECHOCARDIOGRAM  ECHOCARDIOGRAM COMPLETE 05/22/2022  Narrative ECHOCARDIOGRAM REPORT    Patient Name:   Leslie [name] Date of Exam: 05/22/2022 Medical Rec #:  [id number]       Height:       66.0 in Accession #:    [id number]      Weight:       259.0 lb Date of Birth:  [birth year]       BSA:          2.232 m Patient Age:    77 years        BP:           114/72 mmHg Patient Gender: F               HR:           65 bpm. Exam Location:  [address]  Procedure: 2D Echo, 3D Echo, Cardiac Doppler and Color Doppler  Indications:     I42.9 Cardiomyopathy  History:         Patient has prior history of Echocardiogram examinations, most recent 04/09/2022. CHF, CAD and Previous  Myocardial Infarction, Arrythmias:Atrial Fibrillation; Risk Factors:Hypertension, Family History of Coronary Artery Disease, Dyslipidemia and Former Smoker. 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  Sonographer:     Farrel Conners RDCS Referring Phys:  Joylene Grapes Diagnosing Phys: Chilton Si MD  IMPRESSIONS   1. Left ventricular ejection fraction, by estimation, is 65 to 70%. The left ventricle has normal function. The left ventricle has no regional wall motion abnormalities. Left ventricular diastolic parameters are consistent with Grade II diastolic dysfunction (pseudonormalization). Elevated left ventricular end-diastolic pressure. 2. Right ventricular systolic function is normal. The right ventricular size is normal. There is normal pulmonary artery systolic pressure. 3. Left atrial size was severely dilated. 4. The mitral valve is normal in structure. Moderate mitral valve regurgitation. No evidence of mitral stenosis. 5. The aortic valve is tricuspid. Aortic valve regurgitation is moderate. No aortic stenosis is present. Aortic regurgitation PHT measures 653 msec. 6. The inferior vena cava is normal in  Cardiology Office Note:  .   Date:  01/27/2023  ID:  Leslie [name], DOB 06-26-1945, MRN [id number] PCP: Shon Hale, MD  [facility] Providers Cardiologist:  Thurmon Fair, MD    History of Present Illness: .   Leslie [name] is a 77 y.o. 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 Patient is followed by Dr. Royann Shivers and presents today for a hospital follow up appointment.   Per chart review, patient was first diagnosed with atrial fibrillation in 07/2016 and underwent cardioversion at that time. She had recurrence of atrial fibrillation in 05/2019. Attempted rate control but it was difficult, so she underwent repeat cardioversion. Echocardiogram in 06/2019 showed EF 60-65%, no regional wall motion abnormalities, grade II DD, normal RV function, mild MR, moderate AI. Afib was managed with flecainide and verapamil.   In 03/2022, she had an episode of takotsubo cardiomyopathy. She underwent echocardiogram on 04/09/22 that showed EF 25-30% with regional wall motion abnormalities, mild LVH, normal RV function, small pericardial effusion, moderate MR, moderate AI. R/L heart catheterization 04/11/22 showed mild, nonobstructive coronary artery disease with mild pulmonary artery HTN. Due to reduced EF, flecanide and verapamil were transitioned to amiodarone and metoprolol.She was treated with entresto, metoprolol. Echocardiogram 05/22/22 showed EF 65-70%, no regional wall motion abnormalities, grade II DD, normal RV function, normal pulmonary artery pressure, moderate MR, moderate AI.   Patient recently admitted 9/17-9/20 for CHF exacerbation. She presented with shortness of breath, weight gain. BNP elevated to 833. She was treated with IV lasix. Had a bump in creatinine and entresto was held. Discharged on amiodarone 200 mg daily, eliquis 5 mg BID,  lipitor, lasix, metoprolol succinate.   Patient was seen in clinic on 01/17/2023 for evaluation of hypertension, CHF.  Reported that she had been having intermittent episodes of elevated blood pressure and chest discomfort at that time.  His BP had gotten as high as the 200s at home.  She was transition from metoprolol to carvedilol.  BMP at that appointment showed elevated creatinine to 1.72.  Sherryll Burger was unable to be restarted due to poor kidney function.  Her Lasix was decreased to 20 mg daily.  She was scheduled for repeat BMP in 2 weeks.  Patient seen by afib clinic on 01/22/23. At that time, patient was in NSR. She did have some episodes of rapid heart rate noted on her apple watch. Ordered a 2 week monitor. Remained on amiodarone 200 mg daily, carvedilol 6.25 mg BID. Consideration for ablation   Today, patient reports having good days and bad days.  She is very aware of her heart rate.  Reports that whenever she gets moving, her heart rate elevates to 110s.  She also sometimes has resting heart rates in the 80s-100s.  Reports that this is unusual for her, and previously her heart rates were in the 60s.  When her heart rate is elevated, she feels a fluttering in her chest and often hears a pounding in her ears.  Sometimes wakes up at night with the symptoms.  She is currently wearing a heart monitor.  Has been on Lasix 20 mg daily.  Denies shortness of breath, orthopnea, ankle edema.  She does not excellent job of checking her blood pressure at home and keeping a log.  Notes that her systolic BP is usually in the 409W-119J, but can get higher. Sometimes when her blood  Cardiology Office Note:  .   Date:  01/27/2023  ID:  Leslie [name], DOB 06-26-1945, MRN [id number] PCP: Shon Hale, MD  [facility] Providers Cardiologist:  Thurmon Fair, MD    History of Present Illness: .   Leslie [name] is a 77 y.o. 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 Patient is followed by Dr. Royann Shivers and presents today for a hospital follow up appointment.   Per chart review, patient was first diagnosed with atrial fibrillation in 07/2016 and underwent cardioversion at that time. She had recurrence of atrial fibrillation in 05/2019. Attempted rate control but it was difficult, so she underwent repeat cardioversion. Echocardiogram in 06/2019 showed EF 60-65%, no regional wall motion abnormalities, grade II DD, normal RV function, mild MR, moderate AI. Afib was managed with flecainide and verapamil.   In 03/2022, she had an episode of takotsubo cardiomyopathy. She underwent echocardiogram on 04/09/22 that showed EF 25-30% with regional wall motion abnormalities, mild LVH, normal RV function, small pericardial effusion, moderate MR, moderate AI. R/L heart catheterization 04/11/22 showed mild, nonobstructive coronary artery disease with mild pulmonary artery HTN. Due to reduced EF, flecanide and verapamil were transitioned to amiodarone and metoprolol.She was treated with entresto, metoprolol. Echocardiogram 05/22/22 showed EF 65-70%, no regional wall motion abnormalities, grade II DD, normal RV function, normal pulmonary artery pressure, moderate MR, moderate AI.   Patient recently admitted 9/17-9/20 for CHF exacerbation. She presented with shortness of breath, weight gain. BNP elevated to 833. She was treated with IV lasix. Had a bump in creatinine and entresto was held. Discharged on amiodarone 200 mg daily, eliquis 5 mg BID,  lipitor, lasix, metoprolol succinate.   Patient was seen in clinic on 01/17/2023 for evaluation of hypertension, CHF.  Reported that she had been having intermittent episodes of elevated blood pressure and chest discomfort at that time.  His BP had gotten as high as the 200s at home.  She was transition from metoprolol to carvedilol.  BMP at that appointment showed elevated creatinine to 1.72.  Sherryll Burger was unable to be restarted due to poor kidney function.  Her Lasix was decreased to 20 mg daily.  She was scheduled for repeat BMP in 2 weeks.  Patient seen by afib clinic on 01/22/23. At that time, patient was in NSR. She did have some episodes of rapid heart rate noted on her apple watch. Ordered a 2 week monitor. Remained on amiodarone 200 mg daily, carvedilol 6.25 mg BID. Consideration for ablation   Today, patient reports having good days and bad days.  She is very aware of her heart rate.  Reports that whenever she gets moving, her heart rate elevates to 110s.  She also sometimes has resting heart rates in the 80s-100s.  Reports that this is unusual for her, and previously her heart rates were in the 60s.  When her heart rate is elevated, she feels a fluttering in her chest and often hears a pounding in her ears.  Sometimes wakes up at night with the symptoms.  She is currently wearing a heart monitor.  Has been on Lasix 20 mg daily.  Denies shortness of breath, orthopnea, ankle edema.  She does not excellent job of checking her blood pressure at home and keeping a log.  Notes that her systolic BP is usually in the 409W-119J, but can get higher. Sometimes when her blood  Cardiology Office Note:  .   Date:  01/27/2023  ID:  Leslie [name], DOB 06-26-1945, MRN [id number] PCP: Shon Hale, MD  [facility] Providers Cardiologist:  Thurmon Fair, MD    History of Present Illness: .   Leslie [name] is a 77 y.o. 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 Patient is followed by Dr. Royann Shivers and presents today for a hospital follow up appointment.   Per chart review, patient was first diagnosed with atrial fibrillation in 07/2016 and underwent cardioversion at that time. She had recurrence of atrial fibrillation in 05/2019. Attempted rate control but it was difficult, so she underwent repeat cardioversion. Echocardiogram in 06/2019 showed EF 60-65%, no regional wall motion abnormalities, grade II DD, normal RV function, mild MR, moderate AI. Afib was managed with flecainide and verapamil.   In 03/2022, she had an episode of takotsubo cardiomyopathy. She underwent echocardiogram on 04/09/22 that showed EF 25-30% with regional wall motion abnormalities, mild LVH, normal RV function, small pericardial effusion, moderate MR, moderate AI. R/L heart catheterization 04/11/22 showed mild, nonobstructive coronary artery disease with mild pulmonary artery HTN. Due to reduced EF, flecanide and verapamil were transitioned to amiodarone and metoprolol.She was treated with entresto, metoprolol. Echocardiogram 05/22/22 showed EF 65-70%, no regional wall motion abnormalities, grade II DD, normal RV function, normal pulmonary artery pressure, moderate MR, moderate AI.   Patient recently admitted 9/17-9/20 for CHF exacerbation. She presented with shortness of breath, weight gain. BNP elevated to 833. She was treated with IV lasix. Had a bump in creatinine and entresto was held. Discharged on amiodarone 200 mg daily, eliquis 5 mg BID,  lipitor, lasix, metoprolol succinate.   Patient was seen in clinic on 01/17/2023 for evaluation of hypertension, CHF.  Reported that she had been having intermittent episodes of elevated blood pressure and chest discomfort at that time.  His BP had gotten as high as the 200s at home.  She was transition from metoprolol to carvedilol.  BMP at that appointment showed elevated creatinine to 1.72.  Sherryll Burger was unable to be restarted due to poor kidney function.  Her Lasix was decreased to 20 mg daily.  She was scheduled for repeat BMP in 2 weeks.  Patient seen by afib clinic on 01/22/23. At that time, patient was in NSR. She did have some episodes of rapid heart rate noted on her apple watch. Ordered a 2 week monitor. Remained on amiodarone 200 mg daily, carvedilol 6.25 mg BID. Consideration for ablation   Today, patient reports having good days and bad days.  She is very aware of her heart rate.  Reports that whenever she gets moving, her heart rate elevates to 110s.  She also sometimes has resting heart rates in the 80s-100s.  Reports that this is unusual for her, and previously her heart rates were in the 60s.  When her heart rate is elevated, she feels a fluttering in her chest and often hears a pounding in her ears.  Sometimes wakes up at night with the symptoms.  She is currently wearing a heart monitor.  Has been on Lasix 20 mg daily.  Denies shortness of breath, orthopnea, ankle edema.  She does not excellent job of checking her blood pressure at home and keeping a log.  Notes that her systolic BP is usually in the 409W-119J, but can get higher. Sometimes when her blood  653 msec  AORTA Ao Root diam: 2.70 cm Ao Asc diam:  3.60 cm  MITRAL VALVE                TRICUSPID VALVE MV Area (PHT)  cm          TR Peak grad:   24.8 mmHg MV Decel Time: 214 msec     TR Vmax:        249.00 cm/s MR Peak grad: 121.0 mmHg MR Mean grad: 73.0 mmHg     SHUNTS MR Vmax:      550.00 cm/s   Systemic VTI:  0.32 m MR Vmean:     398.0 cm/s    Systemic Diam: 2.00 cm MV E velocity: 118.00 cm/s MV A velocity: 101.40 cm/s MV E/A ratio:  1.16  Chilton Si MD Electronically signed by Chilton Si MD Signature Date/Time: 05/22/2022/1:48:29 PM    Final (Updated)   TEE  ECHO TEE 08/01/2016  Narrative *[place]* *[facility]* [address] [place], [place] [address] [phone]  ------------------------------------------------------------------- Transesophageal Echocardiography  Patient:    Amyriah, Buras MR #:       [id number] Study Date: 08/01/2016 Gender:     F Age:        71 Height:     167.6 cm Weight:     132.7 kg BSA:        2.56 m^2 Pt. Status: Room:  PERFORMING   Donato Schultz, M.D. ADMITTING    Thurmon Fair, MD ATTENDING    Thurmon Fair, MD SONOGRAPHER  Delcie Roch, RDCS, CCT ORDERING     Cline Crock R  cc:  ------------------------------------------------------------------- LV EF: 50% -   55%  ------------------------------------------------------------------- Indications:      Atrial fibrillation - 427.31.  ------------------------------------------------------------------- Study Conclusions  - Left ventricle: Systolic function was normal. The estimated ejection fraction was in the range of 50% to 55%. Wall motion was normal; there were no regional wall motion abnormalities. - Aortic valve: There was moderate regurgitation. - Mitral valve: There was mild to moderate  regurgitation. - Left atrium: No evidence of thrombus in the atrial cavity or appendage. No evidence of thrombus in the appendage. No evidence of thrombus in the atrial cavity or appendage. - Right atrium: No evidence of thrombus in the atrial cavity or appendage.  ------------------------------------------------------------------- Study data:   Study status:  Routine.  Consent:  The risks, benefits, and alternatives to the procedure were explained to the patient and informed consent was obtained.  Procedure:  Initial setup. The patient was brought to the laboratory. Surface ECG leads were monitored. Sedation. Sedation was administered by anesthesiology staff. Transesophageal echocardiography. A transesophageal probe was inserted by the attending cardiologistwithout difficulty. Image quality was adequate. Intravenous contrast (agitated saline) was administered.  Study completion:  The patient tolerated the procedure well. There were no complications.          Diagnostic transesophageal echocardiography.  2D and color Doppler.  Birthdate:  Patient birthdate: 06-10-1945.  Age:  Patient is 77 yr old.  Sex:  Gender: female.    BMI: 47.2 kg/m^2.  Blood pressure:     116/58  Patient status:  Inpatient.  Study date:  Study date: 08/01/2016. Study time: 10:57 AM.  Location:  Endoscopy.  -------------------------------------------------------------------  ------------------------------------------------------------------- Left ventricle:  Systolic function was normal. The estimated ejection fraction was in the range of 50% to 55%. Wall motion was normal; there were no regional wall motion abnormalities.  ------------------------------------------------------------------- Aortic valve:   Structurally  Cardiology Office Note:  .   Date:  01/27/2023  ID:  Leslie [name], DOB 06-26-1945, MRN [id number] PCP: Shon Hale, MD  [facility] Providers Cardiologist:  Thurmon Fair, MD    History of Present Illness: .   Leslie [name] is a 77 y.o. 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 Patient is followed by Dr. Royann Shivers and presents today for a hospital follow up appointment.   Per chart review, patient was first diagnosed with atrial fibrillation in 07/2016 and underwent cardioversion at that time. She had recurrence of atrial fibrillation in 05/2019. Attempted rate control but it was difficult, so she underwent repeat cardioversion. Echocardiogram in 06/2019 showed EF 60-65%, no regional wall motion abnormalities, grade II DD, normal RV function, mild MR, moderate AI. Afib was managed with flecainide and verapamil.   In 03/2022, she had an episode of takotsubo cardiomyopathy. She underwent echocardiogram on 04/09/22 that showed EF 25-30% with regional wall motion abnormalities, mild LVH, normal RV function, small pericardial effusion, moderate MR, moderate AI. R/L heart catheterization 04/11/22 showed mild, nonobstructive coronary artery disease with mild pulmonary artery HTN. Due to reduced EF, flecanide and verapamil were transitioned to amiodarone and metoprolol.She was treated with entresto, metoprolol. Echocardiogram 05/22/22 showed EF 65-70%, no regional wall motion abnormalities, grade II DD, normal RV function, normal pulmonary artery pressure, moderate MR, moderate AI.   Patient recently admitted 9/17-9/20 for CHF exacerbation. She presented with shortness of breath, weight gain. BNP elevated to 833. She was treated with IV lasix. Had a bump in creatinine and entresto was held. Discharged on amiodarone 200 mg daily, eliquis 5 mg BID,  lipitor, lasix, metoprolol succinate.   Patient was seen in clinic on 01/17/2023 for evaluation of hypertension, CHF.  Reported that she had been having intermittent episodes of elevated blood pressure and chest discomfort at that time.  His BP had gotten as high as the 200s at home.  She was transition from metoprolol to carvedilol.  BMP at that appointment showed elevated creatinine to 1.72.  Sherryll Burger was unable to be restarted due to poor kidney function.  Her Lasix was decreased to 20 mg daily.  She was scheduled for repeat BMP in 2 weeks.  Patient seen by afib clinic on 01/22/23. At that time, patient was in NSR. She did have some episodes of rapid heart rate noted on her apple watch. Ordered a 2 week monitor. Remained on amiodarone 200 mg daily, carvedilol 6.25 mg BID. Consideration for ablation   Today, patient reports having good days and bad days.  She is very aware of her heart rate.  Reports that whenever she gets moving, her heart rate elevates to 110s.  She also sometimes has resting heart rates in the 80s-100s.  Reports that this is unusual for her, and previously her heart rates were in the 60s.  When her heart rate is elevated, she feels a fluttering in her chest and often hears a pounding in her ears.  Sometimes wakes up at night with the symptoms.  She is currently wearing a heart monitor.  Has been on Lasix 20 mg daily.  Denies shortness of breath, orthopnea, ankle edema.  She does not excellent job of checking her blood pressure at home and keeping a log.  Notes that her systolic BP is usually in the 409W-119J, but can get higher. Sometimes when her blood

## 2023-01-16 ENCOUNTER — Telehealth: Payer: Self-pay | Admitting: Cardiovascular Disease

## 2023-01-16 ENCOUNTER — Other Ambulatory Visit: Payer: Self-pay | Admitting: Medical Oncology

## 2023-01-16 NOTE — Telephone Encounter (Signed)
Pt c/o BP issue: STAT if pt c/o blurred vision, one-sided weakness or slurred speech  1. What are your last 5 BP readings?   2. Are you having any other symptoms (ex. Dizziness, headache, blurred vision, passed out)?   3. What is your BP issue?   [place] with [facility] states Dr. Chanetta Marshall saw patient yesterday and she has had several episodes of elevated BP. She states during recent hospital admission patient was advised to increase medications Lasix and Metoprolol. Systolic remains near 200. Dr. Chanetta Marshall would like to know if patient should continue to increase Lasix and Metoprolol. Please advise.

## 2023-01-16 NOTE — Telephone Encounter (Signed)
Returned call to [place]. Left voicemail to return call to office

## 2023-01-17 ENCOUNTER — Telehealth: Payer: Self-pay | Admitting: Cardiovascular Disease

## 2023-01-17 ENCOUNTER — Ambulatory Visit: Payer: PPO | Attending: Nurse Practitioner | Admitting: Nurse Practitioner

## 2023-01-17 VITALS — BP 142/74 | HR 67 | Ht 64.0 in | Wt 243.2 lb

## 2023-01-17 DIAGNOSIS — G4733 Obstructive sleep apnea (adult) (pediatric): Secondary | ICD-10-CM | POA: Diagnosis not present

## 2023-01-17 DIAGNOSIS — I351 Nonrheumatic aortic (valve) insufficiency: Secondary | ICD-10-CM

## 2023-01-17 DIAGNOSIS — I5181 Takotsubo syndrome: Secondary | ICD-10-CM | POA: Diagnosis not present

## 2023-01-17 DIAGNOSIS — E782 Mixed hyperlipidemia: Secondary | ICD-10-CM

## 2023-01-17 DIAGNOSIS — I5032 Chronic diastolic (congestive) heart failure: Secondary | ICD-10-CM

## 2023-01-17 DIAGNOSIS — I10 Essential (primary) hypertension: Secondary | ICD-10-CM

## 2023-01-17 DIAGNOSIS — I4819 Other persistent atrial fibrillation: Secondary | ICD-10-CM | POA: Diagnosis not present

## 2023-01-17 DIAGNOSIS — E039 Hypothyroidism, unspecified: Secondary | ICD-10-CM | POA: Diagnosis not present

## 2023-01-17 DIAGNOSIS — N1831 Chronic kidney disease, stage 3a: Secondary | ICD-10-CM

## 2023-01-17 DIAGNOSIS — I340 Nonrheumatic mitral (valve) insufficiency: Secondary | ICD-10-CM

## 2023-01-17 MED ORDER — CARVEDILOL 3.125 MG PO TABS: 3.1250 mg | ORAL_TABLET | Freq: Two times a day (BID) | ORAL | 3 refills | Status: DC

## 2023-01-17 NOTE — Telephone Encounter (Signed)
Spoke with patient and she states she has been having really bad readings on her BP machine. Last night BP was 171/86. She states checked it again 30 mins later and it was 180/101. This morning it was 230/90 30 mins after medication. She checked it again at 9 am 148/79. Currently asymptomatic.  PCP called yesterday concerned about her BP as well. Returned call and left voicemail to return call. Patient had follow up from hospital on the 10/14. I rescheduled her for this afternoon. Please see message from PCP office below    Leslie [name] with [facility] states Dr. Chanetta Marshall saw patient yesterday and she has had several episodes of elevated BP. She states during recent hospital admission patient was advised to increase medications Lasix and Metoprolol. Systolic remains near 200. Dr. Chanetta Marshall would like to know if patient should continue to increase Lasix and Metoprolol. Please advise.

## 2023-01-17 NOTE — Telephone Encounter (Signed)
Pt is requesting a callback from nurse and wouldn't go into detail. Please advise

## 2023-01-17 NOTE — Progress Notes (Unsigned)
Clavulanate] Diarrhea    Did it involve swelling of the face/tongue/throat, SOB, or low BP? No Did it involve sudden or severe rash/hives, skin peeling, or any reaction on the inside of your mouth or nose? No Did you need to seek medical attention at a hospital or doctor's office? No When did it last happen?      10 + years If all above answers are "NO", may proceed with cephalosporin use.    Codeine Nausea Only and Other (See Comments)    Hyperactivity   Demerol [Meperidine] Nausea And Vomiting    Sulfa Antibiotics Rash     Labs/Other Studies Reviewed    The following studies were reviewed today:  Cardiac Studies & Procedures   CARDIAC CATHETERIZATION  CARDIAC CATHETERIZATION 04/11/2022  Narrative   LV end diastolic pressure is mildly elevated.   Hemodynamic findings consistent with mild pulmonary hypertension.   There is no aortic valve stenosis.   On 4L [place]: Ao sat 92%, PA sat 62%, PA pressure 48/20, mean PA pressure 32 mmHg, mean right atrial pressure 8 mmHg, mean pulmonary capillary wedge pressure 15 mmHg, cardiac output 7.09 L/min, cardiac index 3.13.  Mild, nonobstructive coronary artery disease.  Mild pulmonary artery hypertension.  Nonischemic cardiomyopathy.  Continue medical therapy.  Findings Coronary Findings Diagnostic  Dominance: Right  Left Anterior Descending The vessel exhibits minimal luminal irregularities.  Left Circumflex The vessel exhibits minimal luminal irregularities.  Right Coronary Artery The vessel exhibits minimal luminal irregularities.  Intervention  No interventions have been documented.   STRESS TESTS  MYOCARDIAL PERFUSION IMAGING 05/25/2020  Narrative  There was no ST segment deviation noted during stress.  The study is normal.  This is a low risk study.  Normal pharmacologic nuclear stress test with no evidence for prior infarct or ischemia. LVEF not calculated due to atrial fibrillation.   ECHOCARDIOGRAM  ECHOCARDIOGRAM COMPLETE 05/22/2022  Narrative ECHOCARDIOGRAM REPORT    Patient Name:   Leslie [name] Date of Exam: 05/22/2022 Medical Rec #:  [id number]       Height:       66.0 in Accession #:    [id number]      Weight:       259.0 lb Date of Birth:  1945-05-27       BSA:          2.232 m Patient Age:    77 years        BP:           114/72 mmHg Patient Gender: F               HR:           65 bpm. Exam Location:  [address]  Procedure: 2D Echo, 3D Echo, Cardiac Doppler and Color  Doppler  Indications:     I42.9 Cardiomyopathy  History:         Patient has prior history of Echocardiogram examinations, most recent 04/09/2022. CHF, CAD and Previous Myocardial Infarction, Arrythmias:Atrial Fibrillation; Risk Factors:Hypertension, Family History of Coronary Artery Disease, Dyslipidemia and Former Smoker. 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  Sonographer:     Farrel Conners RDCS Referring Phys:  Joylene Grapes Diagnosing Phys: Chilton Si MD  IMPRESSIONS   1. Left ventricular ejection fraction, by estimation, is 65 to 70%. The left ventricle has normal function. The left ventricle has no regional wall motion abnormalities. Left ventricular diastolic parameters are consistent with Grade II diastolic dysfunction (pseudonormalization). Elevated left ventricular end-diastolic pressure. 2. Right  Clavulanate] Diarrhea    Did it involve swelling of the face/tongue/throat, SOB, or low BP? No Did it involve sudden or severe rash/hives, skin peeling, or any reaction on the inside of your mouth or nose? No Did you need to seek medical attention at a hospital or doctor's office? No When did it last happen?      10 + years If all above answers are "NO", may proceed with cephalosporin use.    Codeine Nausea Only and Other (See Comments)    Hyperactivity   Demerol [Meperidine] Nausea And Vomiting    Sulfa Antibiotics Rash     Labs/Other Studies Reviewed    The following studies were reviewed today:  Cardiac Studies & Procedures   CARDIAC CATHETERIZATION  CARDIAC CATHETERIZATION 04/11/2022  Narrative   LV end diastolic pressure is mildly elevated.   Hemodynamic findings consistent with mild pulmonary hypertension.   There is no aortic valve stenosis.   On 4L [place]: Ao sat 92%, PA sat 62%, PA pressure 48/20, mean PA pressure 32 mmHg, mean right atrial pressure 8 mmHg, mean pulmonary capillary wedge pressure 15 mmHg, cardiac output 7.09 L/min, cardiac index 3.13.  Mild, nonobstructive coronary artery disease.  Mild pulmonary artery hypertension.  Nonischemic cardiomyopathy.  Continue medical therapy.  Findings Coronary Findings Diagnostic  Dominance: Right  Left Anterior Descending The vessel exhibits minimal luminal irregularities.  Left Circumflex The vessel exhibits minimal luminal irregularities.  Right Coronary Artery The vessel exhibits minimal luminal irregularities.  Intervention  No interventions have been documented.   STRESS TESTS  MYOCARDIAL PERFUSION IMAGING 05/25/2020  Narrative  There was no ST segment deviation noted during stress.  The study is normal.  This is a low risk study.  Normal pharmacologic nuclear stress test with no evidence for prior infarct or ischemia. LVEF not calculated due to atrial fibrillation.   ECHOCARDIOGRAM  ECHOCARDIOGRAM COMPLETE 05/22/2022  Narrative ECHOCARDIOGRAM REPORT    Patient Name:   Leslie [name] Date of Exam: 05/22/2022 Medical Rec #:  [id number]       Height:       66.0 in Accession #:    [id number]      Weight:       259.0 lb Date of Birth:  1945-05-27       BSA:          2.232 m Patient Age:    77 years        BP:           114/72 mmHg Patient Gender: F               HR:           65 bpm. Exam Location:  [address]  Procedure: 2D Echo, 3D Echo, Cardiac Doppler and Color  Doppler  Indications:     I42.9 Cardiomyopathy  History:         Patient has prior history of Echocardiogram examinations, most recent 04/09/2022. CHF, CAD and Previous Myocardial Infarction, Arrythmias:Atrial Fibrillation; Risk Factors:Hypertension, Family History of Coronary Artery Disease, Dyslipidemia and Former Smoker. 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  Sonographer:     Farrel Conners RDCS Referring Phys:  Joylene Grapes Diagnosing Phys: Chilton Si MD  IMPRESSIONS   1. Left ventricular ejection fraction, by estimation, is 65 to 70%. The left ventricle has normal function. The left ventricle has no regional wall motion abnormalities. Left ventricular diastolic parameters are consistent with Grade II diastolic dysfunction (pseudonormalization). Elevated left ventricular end-diastolic pressure. 2. Right  cm/m   RA Area:     17.60 cm LA Vol (A2C):   79.6 ml  35.66 ml/m  RA Volume:   42.30 ml  18.95 ml/m LA Vol (A4C):   102.0 ml 45.70 ml/m LA Biplane Vol: 93.8 ml  42.02 ml/m AORTIC VALVE LVOT Vmax:   150.50 cm/s LVOT Vmean:  93.800 cm/s LVOT VTI:    0.317 m AI PHT:      653 msec  AORTA Ao Root diam: 2.70 cm Ao Asc diam:  3.60 cm  MITRAL VALVE                TRICUSPID VALVE MV Area (PHT)  cm          TR Peak grad:   24.8 mmHg MV Decel Time: 214 msec     TR Vmax:        249.00 cm/s MR Peak grad: 121.0 mmHg MR Mean grad: 73.0 mmHg     SHUNTS MR Vmax:      550.00 cm/s   Systemic VTI:  0.32 m MR Vmean:     398.0 cm/s    Systemic Diam: 2.00 cm MV E velocity: 118.00 cm/s MV A velocity: 101.40 cm/s MV E/A ratio:  1.16  Chilton Si MD Electronically signed by Chilton Si MD Signature Date/Time: 05/22/2022/1:48:29 PM    Final (Updated)   TEE  ECHO TEE 08/01/2016  Narrative *[place]* *[facility]* [address] [place], [place] [address] [phone]  ------------------------------------------------------------------- Transesophageal Echocardiography  Patient:    Kayci, [place] MR #:       [id number] Study Date: 08/01/2016 Gender:     F Age:        71 Height:     167.6 cm Weight:     132.7 kg BSA:        2.56 m^2 Pt. Status: Room:  PERFORMING   Donato Schultz, M.D. ADMITTING    Thurmon Fair, MD ATTENDING    Thurmon Fair, MD SONOGRAPHER  Delcie Roch, RDCS, CCT ORDERING     Cline Crock R  cc:  ------------------------------------------------------------------- LV EF: 50% -   55%  ------------------------------------------------------------------- Indications:      Atrial fibrillation - 427.31.  ------------------------------------------------------------------- Study Conclusions  - Left ventricle: Systolic function was normal. The estimated ejection fraction was in the range of 50% to 55%. Wall  motion was normal; there were no regional wall motion abnormalities. - Aortic valve: There was moderate regurgitation. - Mitral valve: There was mild to moderate regurgitation. - Left atrium: No evidence of thrombus in the atrial cavity or appendage. No evidence of thrombus in the appendage. No evidence of thrombus in the atrial cavity or appendage. - Right atrium: No evidence of thrombus in the atrial cavity or appendage.  ------------------------------------------------------------------- Study data:   Study status:  Routine.  Consent:  The risks, benefits, and alternatives to the procedure were explained to the patient and informed consent was obtained.  Procedure:  Initial setup. The patient was brought to the laboratory. Surface ECG leads were monitored. Sedation. Sedation was administered by anesthesiology staff. Transesophageal echocardiography. A transesophageal probe was inserted by the attending cardiologistwithout difficulty. Image quality was adequate. Intravenous contrast (agitated saline) was administered.  Study completion:  The patient tolerated the procedure well. There were no complications.          Diagnostic transesophageal echocardiography.  2D and color Doppler.  Birthdate:  Patient birthdate: 02-07-1946.  Age:  Patient is 77 yr old.  Sex:  Gender: female.  cm/m   RA Area:     17.60 cm LA Vol (A2C):   79.6 ml  35.66 ml/m  RA Volume:   42.30 ml  18.95 ml/m LA Vol (A4C):   102.0 ml 45.70 ml/m LA Biplane Vol: 93.8 ml  42.02 ml/m AORTIC VALVE LVOT Vmax:   150.50 cm/s LVOT Vmean:  93.800 cm/s LVOT VTI:    0.317 m AI PHT:      653 msec  AORTA Ao Root diam: 2.70 cm Ao Asc diam:  3.60 cm  MITRAL VALVE                TRICUSPID VALVE MV Area (PHT)  cm          TR Peak grad:   24.8 mmHg MV Decel Time: 214 msec     TR Vmax:        249.00 cm/s MR Peak grad: 121.0 mmHg MR Mean grad: 73.0 mmHg     SHUNTS MR Vmax:      550.00 cm/s   Systemic VTI:  0.32 m MR Vmean:     398.0 cm/s    Systemic Diam: 2.00 cm MV E velocity: 118.00 cm/s MV A velocity: 101.40 cm/s MV E/A ratio:  1.16  Chilton Si MD Electronically signed by Chilton Si MD Signature Date/Time: 05/22/2022/1:48:29 PM    Final (Updated)   TEE  ECHO TEE 08/01/2016  Narrative *[place]* *[facility]* [address] [place], [place] [address] [phone]  ------------------------------------------------------------------- Transesophageal Echocardiography  Patient:    Kayci, [place] MR #:       [id number] Study Date: 08/01/2016 Gender:     F Age:        71 Height:     167.6 cm Weight:     132.7 kg BSA:        2.56 m^2 Pt. Status: Room:  PERFORMING   Donato Schultz, M.D. ADMITTING    Thurmon Fair, MD ATTENDING    Thurmon Fair, MD SONOGRAPHER  Delcie Roch, RDCS, CCT ORDERING     Cline Crock R  cc:  ------------------------------------------------------------------- LV EF: 50% -   55%  ------------------------------------------------------------------- Indications:      Atrial fibrillation - 427.31.  ------------------------------------------------------------------- Study Conclusions  - Left ventricle: Systolic function was normal. The estimated ejection fraction was in the range of 50% to 55%. Wall  motion was normal; there were no regional wall motion abnormalities. - Aortic valve: There was moderate regurgitation. - Mitral valve: There was mild to moderate regurgitation. - Left atrium: No evidence of thrombus in the atrial cavity or appendage. No evidence of thrombus in the appendage. No evidence of thrombus in the atrial cavity or appendage. - Right atrium: No evidence of thrombus in the atrial cavity or appendage.  ------------------------------------------------------------------- Study data:   Study status:  Routine.  Consent:  The risks, benefits, and alternatives to the procedure were explained to the patient and informed consent was obtained.  Procedure:  Initial setup. The patient was brought to the laboratory. Surface ECG leads were monitored. Sedation. Sedation was administered by anesthesiology staff. Transesophageal echocardiography. A transesophageal probe was inserted by the attending cardiologistwithout difficulty. Image quality was adequate. Intravenous contrast (agitated saline) was administered.  Study completion:  The patient tolerated the procedure well. There were no complications.          Diagnostic transesophageal echocardiography.  2D and color Doppler.  Birthdate:  Patient birthdate: 02-07-1946.  Age:  Patient is 77 yr old.  Sex:  Gender: female.  Clavulanate] Diarrhea    Did it involve swelling of the face/tongue/throat, SOB, or low BP? No Did it involve sudden or severe rash/hives, skin peeling, or any reaction on the inside of your mouth or nose? No Did you need to seek medical attention at a hospital or doctor's office? No When did it last happen?      10 + years If all above answers are "NO", may proceed with cephalosporin use.    Codeine Nausea Only and Other (See Comments)    Hyperactivity   Demerol [Meperidine] Nausea And Vomiting    Sulfa Antibiotics Rash     Labs/Other Studies Reviewed    The following studies were reviewed today:  Cardiac Studies & Procedures   CARDIAC CATHETERIZATION  CARDIAC CATHETERIZATION 04/11/2022  Narrative   LV end diastolic pressure is mildly elevated.   Hemodynamic findings consistent with mild pulmonary hypertension.   There is no aortic valve stenosis.   On 4L [place]: Ao sat 92%, PA sat 62%, PA pressure 48/20, mean PA pressure 32 mmHg, mean right atrial pressure 8 mmHg, mean pulmonary capillary wedge pressure 15 mmHg, cardiac output 7.09 L/min, cardiac index 3.13.  Mild, nonobstructive coronary artery disease.  Mild pulmonary artery hypertension.  Nonischemic cardiomyopathy.  Continue medical therapy.  Findings Coronary Findings Diagnostic  Dominance: Right  Left Anterior Descending The vessel exhibits minimal luminal irregularities.  Left Circumflex The vessel exhibits minimal luminal irregularities.  Right Coronary Artery The vessel exhibits minimal luminal irregularities.  Intervention  No interventions have been documented.   STRESS TESTS  MYOCARDIAL PERFUSION IMAGING 05/25/2020  Narrative  There was no ST segment deviation noted during stress.  The study is normal.  This is a low risk study.  Normal pharmacologic nuclear stress test with no evidence for prior infarct or ischemia. LVEF not calculated due to atrial fibrillation.   ECHOCARDIOGRAM  ECHOCARDIOGRAM COMPLETE 05/22/2022  Narrative ECHOCARDIOGRAM REPORT    Patient Name:   Leslie [name] Date of Exam: 05/22/2022 Medical Rec #:  [id number]       Height:       66.0 in Accession #:    [id number]      Weight:       259.0 lb Date of Birth:  1945-05-27       BSA:          2.232 m Patient Age:    77 years        BP:           114/72 mmHg Patient Gender: F               HR:           65 bpm. Exam Location:  [address]  Procedure: 2D Echo, 3D Echo, Cardiac Doppler and Color  Doppler  Indications:     I42.9 Cardiomyopathy  History:         Patient has prior history of Echocardiogram examinations, most recent 04/09/2022. CHF, CAD and Previous Myocardial Infarction, Arrythmias:Atrial Fibrillation; Risk Factors:Hypertension, Family History of Coronary Artery Disease, Dyslipidemia and Former Smoker. 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  Sonographer:     Farrel Conners RDCS Referring Phys:  Joylene Grapes Diagnosing Phys: Chilton Si MD  IMPRESSIONS   1. Left ventricular ejection fraction, by estimation, is 65 to 70%. The left ventricle has normal function. The left ventricle has no regional wall motion abnormalities. Left ventricular diastolic parameters are consistent with Grade II diastolic dysfunction (pseudonormalization). Elevated left ventricular end-diastolic pressure. 2. Right  cm/m   RA Area:     17.60 cm LA Vol (A2C):   79.6 ml  35.66 ml/m  RA Volume:   42.30 ml  18.95 ml/m LA Vol (A4C):   102.0 ml 45.70 ml/m LA Biplane Vol: 93.8 ml  42.02 ml/m AORTIC VALVE LVOT Vmax:   150.50 cm/s LVOT Vmean:  93.800 cm/s LVOT VTI:    0.317 m AI PHT:      653 msec  AORTA Ao Root diam: 2.70 cm Ao Asc diam:  3.60 cm  MITRAL VALVE                TRICUSPID VALVE MV Area (PHT)  cm          TR Peak grad:   24.8 mmHg MV Decel Time: 214 msec     TR Vmax:        249.00 cm/s MR Peak grad: 121.0 mmHg MR Mean grad: 73.0 mmHg     SHUNTS MR Vmax:      550.00 cm/s   Systemic VTI:  0.32 m MR Vmean:     398.0 cm/s    Systemic Diam: 2.00 cm MV E velocity: 118.00 cm/s MV A velocity: 101.40 cm/s MV E/A ratio:  1.16  Chilton Si MD Electronically signed by Chilton Si MD Signature Date/Time: 05/22/2022/1:48:29 PM    Final (Updated)   TEE  ECHO TEE 08/01/2016  Narrative *[place]* *[facility]* [address] [place], [place] [address] [phone]  ------------------------------------------------------------------- Transesophageal Echocardiography  Patient:    Kayci, [place] MR #:       [id number] Study Date: 08/01/2016 Gender:     F Age:        71 Height:     167.6 cm Weight:     132.7 kg BSA:        2.56 m^2 Pt. Status: Room:  PERFORMING   Donato Schultz, M.D. ADMITTING    Thurmon Fair, MD ATTENDING    Thurmon Fair, MD SONOGRAPHER  Delcie Roch, RDCS, CCT ORDERING     Cline Crock R  cc:  ------------------------------------------------------------------- LV EF: 50% -   55%  ------------------------------------------------------------------- Indications:      Atrial fibrillation - 427.31.  ------------------------------------------------------------------- Study Conclusions  - Left ventricle: Systolic function was normal. The estimated ejection fraction was in the range of 50% to 55%. Wall  motion was normal; there were no regional wall motion abnormalities. - Aortic valve: There was moderate regurgitation. - Mitral valve: There was mild to moderate regurgitation. - Left atrium: No evidence of thrombus in the atrial cavity or appendage. No evidence of thrombus in the appendage. No evidence of thrombus in the atrial cavity or appendage. - Right atrium: No evidence of thrombus in the atrial cavity or appendage.  ------------------------------------------------------------------- Study data:   Study status:  Routine.  Consent:  The risks, benefits, and alternatives to the procedure were explained to the patient and informed consent was obtained.  Procedure:  Initial setup. The patient was brought to the laboratory. Surface ECG leads were monitored. Sedation. Sedation was administered by anesthesiology staff. Transesophageal echocardiography. A transesophageal probe was inserted by the attending cardiologistwithout difficulty. Image quality was adequate. Intravenous contrast (agitated saline) was administered.  Study completion:  The patient tolerated the procedure well. There were no complications.          Diagnostic transesophageal echocardiography.  2D and color Doppler.  Birthdate:  Patient birthdate: 02-07-1946.  Age:  Patient is 77 yr old.  Sex:  Gender: female.  cm/m   RA Area:     17.60 cm LA Vol (A2C):   79.6 ml  35.66 ml/m  RA Volume:   42.30 ml  18.95 ml/m LA Vol (A4C):   102.0 ml 45.70 ml/m LA Biplane Vol: 93.8 ml  42.02 ml/m AORTIC VALVE LVOT Vmax:   150.50 cm/s LVOT Vmean:  93.800 cm/s LVOT VTI:    0.317 m AI PHT:      653 msec  AORTA Ao Root diam: 2.70 cm Ao Asc diam:  3.60 cm  MITRAL VALVE                TRICUSPID VALVE MV Area (PHT)  cm          TR Peak grad:   24.8 mmHg MV Decel Time: 214 msec     TR Vmax:        249.00 cm/s MR Peak grad: 121.0 mmHg MR Mean grad: 73.0 mmHg     SHUNTS MR Vmax:      550.00 cm/s   Systemic VTI:  0.32 m MR Vmean:     398.0 cm/s    Systemic Diam: 2.00 cm MV E velocity: 118.00 cm/s MV A velocity: 101.40 cm/s MV E/A ratio:  1.16  Chilton Si MD Electronically signed by Chilton Si MD Signature Date/Time: 05/22/2022/1:48:29 PM    Final (Updated)   TEE  ECHO TEE 08/01/2016  Narrative *[place]* *[facility]* [address] [place], [place] [address] [phone]  ------------------------------------------------------------------- Transesophageal Echocardiography  Patient:    Kayci, [place] MR #:       [id number] Study Date: 08/01/2016 Gender:     F Age:        71 Height:     167.6 cm Weight:     132.7 kg BSA:        2.56 m^2 Pt. Status: Room:  PERFORMING   Donato Schultz, M.D. ADMITTING    Thurmon Fair, MD ATTENDING    Thurmon Fair, MD SONOGRAPHER  Delcie Roch, RDCS, CCT ORDERING     Cline Crock R  cc:  ------------------------------------------------------------------- LV EF: 50% -   55%  ------------------------------------------------------------------- Indications:      Atrial fibrillation - 427.31.  ------------------------------------------------------------------- Study Conclusions  - Left ventricle: Systolic function was normal. The estimated ejection fraction was in the range of 50% to 55%. Wall  motion was normal; there were no regional wall motion abnormalities. - Aortic valve: There was moderate regurgitation. - Mitral valve: There was mild to moderate regurgitation. - Left atrium: No evidence of thrombus in the atrial cavity or appendage. No evidence of thrombus in the appendage. No evidence of thrombus in the atrial cavity or appendage. - Right atrium: No evidence of thrombus in the atrial cavity or appendage.  ------------------------------------------------------------------- Study data:   Study status:  Routine.  Consent:  The risks, benefits, and alternatives to the procedure were explained to the patient and informed consent was obtained.  Procedure:  Initial setup. The patient was brought to the laboratory. Surface ECG leads were monitored. Sedation. Sedation was administered by anesthesiology staff. Transesophageal echocardiography. A transesophageal probe was inserted by the attending cardiologistwithout difficulty. Image quality was adequate. Intravenous contrast (agitated saline) was administered.  Study completion:  The patient tolerated the procedure well. There were no complications.          Diagnostic transesophageal echocardiography.  2D and color Doppler.  Birthdate:  Patient birthdate: 02-07-1946.  Age:  Patient is 77 yr old.  Sex:  Gender: female.  Clavulanate] Diarrhea    Did it involve swelling of the face/tongue/throat, SOB, or low BP? No Did it involve sudden or severe rash/hives, skin peeling, or any reaction on the inside of your mouth or nose? No Did you need to seek medical attention at a hospital or doctor's office? No When did it last happen?      10 + years If all above answers are "NO", may proceed with cephalosporin use.    Codeine Nausea Only and Other (See Comments)    Hyperactivity   Demerol [Meperidine] Nausea And Vomiting    Sulfa Antibiotics Rash     Labs/Other Studies Reviewed    The following studies were reviewed today:  Cardiac Studies & Procedures   CARDIAC CATHETERIZATION  CARDIAC CATHETERIZATION 04/11/2022  Narrative   LV end diastolic pressure is mildly elevated.   Hemodynamic findings consistent with mild pulmonary hypertension.   There is no aortic valve stenosis.   On 4L [place]: Ao sat 92%, PA sat 62%, PA pressure 48/20, mean PA pressure 32 mmHg, mean right atrial pressure 8 mmHg, mean pulmonary capillary wedge pressure 15 mmHg, cardiac output 7.09 L/min, cardiac index 3.13.  Mild, nonobstructive coronary artery disease.  Mild pulmonary artery hypertension.  Nonischemic cardiomyopathy.  Continue medical therapy.  Findings Coronary Findings Diagnostic  Dominance: Right  Left Anterior Descending The vessel exhibits minimal luminal irregularities.  Left Circumflex The vessel exhibits minimal luminal irregularities.  Right Coronary Artery The vessel exhibits minimal luminal irregularities.  Intervention  No interventions have been documented.   STRESS TESTS  MYOCARDIAL PERFUSION IMAGING 05/25/2020  Narrative  There was no ST segment deviation noted during stress.  The study is normal.  This is a low risk study.  Normal pharmacologic nuclear stress test with no evidence for prior infarct or ischemia. LVEF not calculated due to atrial fibrillation.   ECHOCARDIOGRAM  ECHOCARDIOGRAM COMPLETE 05/22/2022  Narrative ECHOCARDIOGRAM REPORT    Patient Name:   Leslie [name] Date of Exam: 05/22/2022 Medical Rec #:  [id number]       Height:       66.0 in Accession #:    [id number]      Weight:       259.0 lb Date of Birth:  1945-05-27       BSA:          2.232 m Patient Age:    77 years        BP:           114/72 mmHg Patient Gender: F               HR:           65 bpm. Exam Location:  [address]  Procedure: 2D Echo, 3D Echo, Cardiac Doppler and Color  Doppler  Indications:     I42.9 Cardiomyopathy  History:         Patient has prior history of Echocardiogram examinations, most recent 04/09/2022. CHF, CAD and Previous Myocardial Infarction, Arrythmias:Atrial Fibrillation; Risk Factors:Hypertension, Family History of Coronary Artery Disease, Dyslipidemia and Former Smoker. 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  Sonographer:     Farrel Conners RDCS Referring Phys:  Joylene Grapes Diagnosing Phys: Chilton Si MD  IMPRESSIONS   1. Left ventricular ejection fraction, by estimation, is 65 to 70%. The left ventricle has normal function. The left ventricle has no regional wall motion abnormalities. Left ventricular diastolic parameters are consistent with Grade II diastolic dysfunction (pseudonormalization). Elevated left ventricular end-diastolic pressure. 2. Right

## 2023-01-17 NOTE — Patient Instructions (Signed)
Medication Instructions:  Stop Metoprolol Succinate as directed. Start Carvedilol 3.125 mg twice daily.  *If you need a refill on your cardiac medications before your next appointment, please call your pharmacy*   Lab Work: BMET today  Testing/Procedures: NONE ordered at this time of appointment    Follow-Up: At [facility], you and your health needs are our priority.  As part of our continuing mission to provide you with exceptional heart care, we have created designated Provider Care Teams.  These Care Teams include your primary Cardiologist (physician) and Advanced Practice Providers (APPs -  Physician Assistants and Nurse Practitioners) who all work together to provide you with the care you need, when you need it.  We recommend signing up for the patient portal called "MyChart".  Sign up information is provided on this After Visit Summary.  MyChart is used to connect with patients for Virtual Visits (Telemedicine).  Patients are able to view lab/test results, encounter notes, upcoming appointments, etc.  Non-urgent messages can be sent to your provider as well.   To learn more about what you can do with MyChart, go to [email or web address].    Your next appointment:    Keep follow up   Provider:   Robet Leu, PA-C

## 2023-01-17 NOTE — Telephone Encounter (Signed)
Duplicate encounter. Please encounter for today

## 2023-01-18 ENCOUNTER — Encounter: Payer: Self-pay | Admitting: Nurse Practitioner

## 2023-01-18 ENCOUNTER — Telehealth: Payer: Self-pay | Admitting: Internal Medicine

## 2023-01-18 LAB — BASIC METABOLIC PANEL
BUN/Creatinine Ratio: 17 (ref 12–28)
BUN: 29 mg/dL — ABNORMAL HIGH (ref 8–27)
CO2: 19 mmol/L — ABNORMAL LOW (ref 20–29)
Calcium: 9.7 mg/dL (ref 8.7–10.3)
Chloride: 102 mmol/L (ref 96–106)
Creatinine, Ser: 1.72 mg/dL — ABNORMAL HIGH (ref 0.57–1.00)
Glucose: 94 mg/dL (ref 70–99)
Potassium: 4.8 mmol/L (ref 3.5–5.2)
Sodium: 140 mmol/L (ref 134–144)
eGFR: 30 mL/min/{1.73_m2} — ABNORMAL LOW (ref >59)

## 2023-01-18 NOTE — Telephone Encounter (Signed)
Leslie [name] called nursing line with tachycardia and hypertension.    165/91 HR 114  Patient took carvedilol 3.125mg  at 9:30 but minimal response. Advised taking 1 extra dose. If any chest pain or dyspnea, advised ED evaluation .  [name], MD MS

## 2023-01-20 ENCOUNTER — Telehealth: Payer: Self-pay | Admitting: Cardiovascular Disease

## 2023-01-20 NOTE — Telephone Encounter (Signed)
Pt c/o BP issue: STAT if pt c/o blurred vision, one-sided weakness or slurred speech  1. What are your last 5 BP readings?   145/88  HR 68 (a few minutes ago) 177/103  HR 113 (6:00 am) 141/69  HR 75  (4:00 am while resting)     2. Are you having any other symptoms (ex. Dizziness, headache, blurred vision, passed out)?  Patient stated she has had palpitations, a little bit of a headache, nervous and jittery (when patient woke up at 4:00 am)  3. What is your BP issue?   Patient stated her BP and HR has been up and down and her weight has increased by 3 lbs.  Patient noted she took 2 carvedilol (COREG) 3.125 MG tablet and her other medications.

## 2023-01-20 NOTE — Telephone Encounter (Signed)
Spoke with patient. Provided her with advice of increasing carvedilol to 6.25mg  BID. Scheduled her AFib clinic appt on 10/9 @ 3pm with C. Fenton PA (whom she saw in April 2024). She is aware of this office location. She does not need a new Rx right now for carvedilol.

## 2023-01-20 NOTE — Telephone Encounter (Signed)
Spoke with patient of Dr. Salena Saner -- saw Irving Burton NP 10/4  This morning she was awoken out of dead sleep at 4am. She reports she was restless, jittery, nauseous. She said she can hear her heart beating. She laid in bed for a while and checked BP (141/69 HR 75). She got up at 6am and BP was 177/103 (HR 111). She recently checked BP and it was 145/88 (HR 68).   Of note, patient called after hours on-call provider for tachycardia and was advised to take additional coreg. This morning, she took 2 coreg with her current meds.   She reports HR jumps up with minimal exertion, like getting up and walking to bathroom. She has PAF.   She said when her HR and BP goes up, she "always has some chest pain" that is "like heartburn".   She reports weight is up 3lbs from yesterday. Advised to take lasix 40mg  x1 (on her med list PRN)  She said this weekend was not a good one for her.   Advised will send to [facility] NP (also Dr. Royann Shivers - Dr. Rennis Golden covering) for review.

## 2023-01-20 NOTE — Telephone Encounter (Signed)
Joylene Grapes, NP  Chrystie Nose, MD1 hour ago (3:10 PM)    Thank you. You're right, I think I will have her follow-up with a fib clinic. I appreciate your help. -EM   Joylene Grapes, NP  Lamar Benes, CMA1 hour ago (3:09 PM)    For now, we can try increasing carvedilol to 6.25 twice daily and see if she sees any improvement with this.  Would also recommend follow-up with A-fib clinic ASAP given increased palpitations (she last saw them in 07/2022). Thank you -EM   Hilty, Lisette Abu, MD  Joylene Grapes, NP1 hour ago (2:28 PM)    Dr. Salena Saner is off for 2 weeks - if the amiodarone is not helpful a retrial of flecainide could be an option, but would have to washout amiodarone for a month. Might also consider dofetilide, but then would need hospitalization. Since she is having breakthrough palpitations, maybe getting her into the afib clinic would be a good option for their recommendations.  -[place]   Monge, Petra Kuba, NP  You; Croitoru, Mihai, MD7 hours ago (9:02 AM)    Dr. Royann Shivers, what do you think about switching her back to her flecainide and verapamil. Per our previous discussion, you had mentioned, "because of the lower side effect profile, will likely switch back to flecainide and verapamil instead of amiodarone. Because amiodarone requires a longer "washout", would need to discontinue amio for about 4 weeks prior to restarting flecainide.  Okay to switch from current metoprolol prescription to her previous dose of verapamil at any time. Can discuss these changes at next follow-up visit." Amiodarone was ultimately continued at follow-up with you, and I just switched her to carvedilol at her last visit due to elevated BP, but I am wondering if this would be a better solution? I appreciate your suggestions. Thanks, -EM

## 2023-01-22 ENCOUNTER — Inpatient Hospital Stay (HOSPITAL_COMMUNITY)
Admission: RE | Admit: 2023-01-22 | Discharge: 2023-01-22 | Disposition: A | Discharge status: Home / Self Care | Payer: PPO | Source: Ambulatory Visit | Attending: Physician Assistant | Admitting: Physician Assistant

## 2023-01-22 ENCOUNTER — Ambulatory Visit (HOSPITAL_COMMUNITY)
Admission: RE | Admit: 2023-01-22 | Discharge: 2023-01-22 | Disposition: A | Discharge status: Home / Self Care | Payer: PPO | Source: Ambulatory Visit | Attending: Physician Assistant | Admitting: Physician Assistant

## 2023-01-22 ENCOUNTER — Telehealth: Payer: Self-pay

## 2023-01-22 VITALS — BP 142/74 | HR 64 | Ht 64.0 in | Wt 246.2 lb

## 2023-01-22 DIAGNOSIS — I5032 Chronic diastolic (congestive) heart failure: Secondary | ICD-10-CM | POA: Insufficient documentation

## 2023-01-22 DIAGNOSIS — N189 Chronic kidney disease, unspecified: Secondary | ICD-10-CM | POA: Diagnosis not present

## 2023-01-22 DIAGNOSIS — Z79899 Other long term (current) drug therapy: Secondary | ICD-10-CM | POA: Diagnosis not present

## 2023-01-22 DIAGNOSIS — E669 Obesity, unspecified: Secondary | ICD-10-CM | POA: Insufficient documentation

## 2023-01-22 DIAGNOSIS — Z7901 Long term (current) use of anticoagulants: Secondary | ICD-10-CM | POA: Diagnosis not present

## 2023-01-22 DIAGNOSIS — I130 Hypertensive heart and chronic kidney disease with heart failure and stage 1 through stage 4 chronic kidney disease, or unspecified chronic kidney disease: Secondary | ICD-10-CM | POA: Insufficient documentation

## 2023-01-22 DIAGNOSIS — I10 Essential (primary) hypertension: Secondary | ICD-10-CM

## 2023-01-22 DIAGNOSIS — I428 Other cardiomyopathies: Secondary | ICD-10-CM | POA: Diagnosis not present

## 2023-01-22 DIAGNOSIS — D6869 Other thrombophilia: Secondary | ICD-10-CM | POA: Diagnosis not present

## 2023-01-22 DIAGNOSIS — Z6841 Body Mass Index (BMI) 40.0 and over, adult: Secondary | ICD-10-CM | POA: Insufficient documentation

## 2023-01-22 DIAGNOSIS — Z5181 Encounter for therapeutic drug level monitoring: Secondary | ICD-10-CM | POA: Diagnosis not present

## 2023-01-22 DIAGNOSIS — I4891 Unspecified atrial fibrillation: Secondary | ICD-10-CM

## 2023-01-22 DIAGNOSIS — G4733 Obstructive sleep apnea (adult) (pediatric): Secondary | ICD-10-CM | POA: Insufficient documentation

## 2023-01-22 DIAGNOSIS — I4819 Other persistent atrial fibrillation: Secondary | ICD-10-CM | POA: Diagnosis not present

## 2023-01-22 DIAGNOSIS — I480 Paroxysmal atrial fibrillation: Secondary | ICD-10-CM

## 2023-01-22 MED ORDER — FUROSEMIDE 20 MG PO TABS: ORAL_TABLET | ORAL | 3 refills | Status: DC

## 2023-01-22 NOTE — Telephone Encounter (Signed)
Spoke with pt. Pt was notified of lab results. Pt will decrease Lasix to 20 mg daily and may take an additional 20 mg on the days of increase swelling, weight gain of 3 lb in 3 days and 5 lb in 1 week. Repeat labs in 2 weeks.

## 2023-01-22 NOTE — Progress Notes (Signed)
Primary Care Physician: Shon Hale, MD Primary Cardiologist: Dr Royann Shivers Primary Electrophysiologist: none Referring Physician: HeartCare triage   Leslie [name] is a 77 y.o. female with a history of CKD, HLD, HTN, hypothyroidism, mild-mod MR, mod AI, OSA, and atrial fibrillation who presents for consultation in the [facility].  The patient was initially diagnosed with atrial fibrillation 07/2016 and had DCCV at that time. Beta-blocker dose was decreased in May 2018 when she had junctional rhythm.  After she received her second dose of coronavirus vaccine in February 2021 she developed symptomatic persistent atrial fibrillation with RVR, again challenging to rate control and underwent cardioversion on June 24, 2019. Patient is on Eliquis for a CHADS2VASC score of 3.   She went back  into the afib 04/29/20  with RVR. She called into the answering service and spoke to [place], [name]. He increased her dose of metoprolol to 100 mg in  the pm (usual dose  100 mg  in am and 50 mg in pm) Her v range at home is 90's to 120. She feels she went into afib with worries over a sick cat and the weather. She  does feel fatigue and shortness of breath with activity in afib. She is compliant with CPAP, minimal caffeine, no alcohol, no tobacco. No missed anticoagulation. Has had all vaccines.  F/u successful cardioversion, 05/09/20. unfortunately she is back in afib with RVR. Dicussed antiarrythmic's with pt and with Dr. Salena Saner. We discussed use of flecainide as she does not want to use amiodarone with her known thyroid disease. She  will need a stress test which Dr. Salena Saner will coordinate and then will get loaded on flecainide with a low risk test and try to get cardioverted in a timely manner. Pt is in agreement. No missed anticoagulation. No conduction issues on ekg's in SR. EF was normal on latest echo.   F/u in afib clinic, 12/07/20, for f/u from Dr. Salena Saner for flecainide surveillance.  She has been staying in SR on flecainide. She has a h/o of migraines and feel that they may have increased around the time of starting flecainide but cannot say for sure this is what is triggering them again. She was having some bradycardia buyt her drugs was tweaked by Dr. Salena Saner in May  and this has improved.   Follow up in the AF clinic 07/26/22.  She presented to the ED on 04/09/2022 with shortness of breath, hypertensive urgency with SBP >200.  Echocardiogram showed EF 25 to 30%, severely decreased LV function, RWMA, mild LVH, normal RV systolic function, small pericardial effusion, moderate mitral valve regurgitation, moderate aortic valve regurgitation.  Troponin was elevated.  Cardiology was consulted and she underwent R/LHC on 04/11/2022 which revealed mild pulmonary hypertension, mild nonobstructive CAD, and ICM. Flecainide and verapamil were transitioned to amiodarone and metoprolol in the setting of reduced EF.  It was thought that her acute systolic heart failure was secondary to Takotsubo/stress-induced cardiomyopathy/type II MI in the setting of hypertensive urgency.  She was discharged home in stable condition on 04/12/2022. Echo 05/22/22 showed her EF returned to normal. This morning, patient had sudden onset tachypalpitations, her heart rates were up to 140s bpm. She called the clinic to make an appointment and took her morning medications. She converted to SR en route. There were no specific triggers that she could identify.   Follow up in the AF clinic 01/22/23. Patient reports that she has noticed "high heart rate" notifications on her  smart watch. These can be associated with high BP readings as well. She does have palpitations. The ECG strips from her watch show sinus tachycardia with rates in the low 100's. No bleeding issues on anticoagulation.   Today, she denies symptoms of chest pain, shortness of breath, orthopnea, PND, lower extremity edema, dizziness, presyncope, syncope, snoring, daytime  somnolence, bleeding, or neurologic sequela. The patient is tolerating medications without difficulties and is otherwise without complaint today.    Atrial Fibrillation Risk Factors:  she does have symptoms or diagnosis of sleep apnea. she is compliant with CPAP therapy. she does not have a history of rheumatic fever. she does not have a history of alcohol use. The patient does not have a history of early familial atrial fibrillation or other arrhythmias.   Atrial Fibrillation Management history:  Previous antiarrhythmic drugs: flecainide, amiodarone  Previous cardioversions: 08/01/2016, 06/24/19 Previous ablations: none Anticoagulation history: Eliquis   Past Medical History:  Diagnosis Date   Adenomatous polyp    Arthritis    "knees, legs, fingers" (07/30/2016)   Asthma    Bursitis of left shoulder    "just finished PT" (07/30/2016)   Chest pain    a. 2003 Abnl stress test-->Cath: nonobs CAD.   CHF (congestive heart failure) (HCC)    Chronic bronchitis (HCC)    Chronic kidney disease    stage 3   GERD (gastroesophageal reflux disease)    barrett's esophagus- Dr. Matthias Hughs   History of hiatal hernia    Hyperlipidemia    Hypertension    Hypothyroidism    Migraines    "sporatic; at least a few/year" (07/30/2016)   Mitral regurgitation    a. 07/2016 Echo: mild to mod MR;  b. 07/2016 TEE: mild MR.   Moderate aortic insufficiency    a. 07/2016 Echo: EF 55-60%, mod AI;  b. 07/2016 TEE: EF 50-55%, mod AI.   Obesity    s/p lap band surgery 6/10   OSA on CPAP    PAF (paroxysmal atrial fibrillation) (HCC)    a. 07/2016 TEE/DCCV: EF 50-55%, mild MR, mod AI, mild to mod TR, neg bubble study-->successful DCCV x1 w/ 120J.   PONV (postoperative nausea and vomiting)    Vitamin D deficiency     Current Outpatient Medications  Medication Sig Dispense Refill   acetaminophen (TYLENOL) 650 MG CR tablet Take 1,300 mg by mouth at bedtime as needed for pain.     amiodarone (PACERONE) 200 MG  tablet Take 1 tablet (200 mg total) by mouth daily. 90 tablet 3   amLODipine (NORVASC) 5 MG tablet Take 5 mg by mouth daily.     apixaban (ELIQUIS) 5 MG TABS tablet Take 1 tablet (5 mg total) by mouth 2 (two) times daily. 180 tablet 3   ARNUITY ELLIPTA 50 MCG/ACT AEPB Inhale 1 puff into the lungs at bedtime.     atorvastatin (LIPITOR) 40 MG tablet Take 1 tablet (40 mg total) by mouth daily. 90 tablet 3   Bacillus Coagulans-Inulin (ALIGN PREBIOTIC-PROBIOTIC PO) Taking 2 Gummies by mouth daily     carvedilol (COREG) 3.125 MG tablet Take 6.25 mg by mouth 2 (two) times daily with a meal.     cholecalciferol (VITAMIN D) 1000 UNITS tablet Take 1,000 Units by mouth daily.     diclofenac Sodium (VOLTAREN) 1 % GEL Apply 1 application topically 2 (two) times daily.     fexofenadine (ALLEGRA) 180 MG tablet Take 180 mg by mouth daily.     fluticasone (FLONASE) 50 MCG/ACT nasal  spray Place 2 sprays into both nostrils at bedtime.      furosemide (LASIX) 20 MG tablet Take 20 mg daily. May take an additional 20 mg on the days of increase swelling, weight gain of 3 lb overnight or 5 lb in 1 week. 180 tablet 3   Glucosamine-Chondroitin (OSTEO BI-FLEX REGULAR STRENGTH PO) Take 1 tablet by mouth daily.     levothyroxine (SYNTHROID, LEVOTHROID) 50 MCG tablet Take 50 mcg by mouth daily before breakfast.     magnesium oxide (MAG-OX) 400 (240 Mg) MG tablet Take 1 tablet (400 mg total) by mouth daily. 90 tablet 3   metroNIDAZOLE (METROCREAM) 0.75 % cream Apply 1 application  topically daily.     mometasone (ELOCON) 0.1 % cream Apply 1 application  topically 2 (two) times daily as needed (skin irritation).     Multiple Vitamins-Minerals (CENTRUM MULTIGUMMIES) CHEW Takes 1 gummy by mouth daily     omeprazole (PRILOSEC) 20 MG capsule Take 40 mg by mouth daily before breakfast.     No current facility-administered medications for this encounter.    ROS- All systems are reviewed and negative except as per the HPI  above.  Physical Exam: Vitals:   01/22/23 1454  BP: (!) 142/74  Pulse: 64  Weight: 111.7 kg  Height: 5\' 4"  (1.626 m)    GEN: Well nourished, well developed in no acute distress NECK: No JVD; No carotid bruits CARDIAC: Regular rate and rhythm, no murmurs, rubs, gallops RESPIRATORY:  Clear to auscultation without rales, wheezing or rhonchi  ABDOMEN: Soft, non-tender, non-distended EXTREMITIES:  No edema; No deformity    Wt Readings from Last 3 Encounters:  01/22/23 111.7 kg  01/17/23 110.3 kg  01/03/23 112.4 kg    EKG today demonstrates  SR Vent. rate 64 BPM PR interval 180 ms QRS duration 80 ms QT/QTcB 416/429 ms  Echo 05/22/22 demonstrated   1. Left ventricular ejection fraction, by estimation, is 65 to 70%. The  left ventricle has normal function. The left ventricle has no regional  wall motion abnormalities. Left ventricular diastolic parameters are  consistent with Grade II diastolic dysfunction (pseudonormalization). Elevated left ventricular end-diastolic pressure.   2. Right ventricular systolic function is normal. The right ventricular  size is normal. There is normal pulmonary artery systolic pressure.   3. Left atrial size was severely dilated.   4. The mitral valve is normal in structure. Moderate mitral valve  regurgitation. No evidence of mitral stenosis.   5. The aortic valve is tricuspid. Aortic valve regurgitation is moderate.  No aortic stenosis is present. Aortic regurgitation PHT measures 653 msec.   6. The inferior vena cava is normal in size with greater than 50%  respiratory variability, suggesting right atrial pressure of 3 mmHg.   Epic records are reviewed at length today  CHA2DS2-VASc Score = 5  The patient's score is based upon: CHF History: 1 HTN History: 1 Diabetes History: 0 Stroke History: 0 Vascular Disease History: 0 Age Score: 2 Gender Score: 1    ASSESSMENT AND PLAN: Persistent Atrial Fibrillation (ICD10:  I48.19) The  patient's CHA2DS2-VASc score is 5, indicating a 7.2% annual risk of stroke.   Patient in SR today. Apple Watch strips show sinus tachycardia. Will have her wear a 2 week Zio monitor to try and clarify her arrhythmia burden. If she is have more frequent afib, can consider going back to flecainide, change to dofetilide, or consider ablation. Flecainide may not be the best option given her recent CHF admission.  Patient would favor remaining on amiodarone and pursuing ablation. BMI goal < 40. She has lost 20 lbs this year.  Continue amiodarone 200 mg daily.  Continue Eliquis 5 mg BID Continue carvedilol 6.25 mg BID  Secondary Hypercoagulable State (ICD10:  D68.69) The patient is at significant risk for stroke/thromboembolism based upon her CHA2DS2-VASc Score of 5.  Continue Apixaban (Eliquis).   Obesity Body mass index is 42.26 kg/m.  Encouraged lifestyle modification S/p lab band 2010   OSA  Encouraged nightly CPAP  HTN Stable on current regimen  HFpEF/NICM Supsected stress induced CM secondary to hypertensive emergency.  LHC did not show obstructive disease. EF normalized on echo 05/22/22.  Admitted with acute CHF 12/31/22 GDMT per primary cardiology team. Fluid status appears stable today   Follow up with Robet Leu as scheduled. AF clinic pending monitor results.    Jorja Loa PA-C Afib Clinic [facility] [address] [place], [place] [address] [phone]

## 2023-01-23 DIAGNOSIS — G4733 Obstructive sleep apnea (adult) (pediatric): Secondary | ICD-10-CM | POA: Diagnosis not present

## 2023-01-24 ENCOUNTER — Telehealth: Payer: Self-pay | Admitting: Cardiovascular Disease

## 2023-01-24 NOTE — Telephone Encounter (Signed)
Called pt she states I felt jittery when I woke up. "It's pretty much the same thing that has been going on all week." My heart rate just keeps going up and down. But my watch is not showing a-fib. Pt states "I'm very compliant." Pt states when she woke up with jitters, in the bed 158/92 HR 68.  Pt advised to keep her appt on Monday as well ans keep her blood pressure log over the weekend. Pt given Ed recommendations. No further concerns at this time.

## 2023-01-24 NOTE — Telephone Encounter (Signed)
Pt c/o BP issue: STAT if pt c/o blurred vision, one-sided weakness or slurred speech  1. What are your last 5 BP readings?   170/81  HR 64 208/80  HR 61 125/70  HR 60 144/89  HR 62 164/90  HR 71 (today at 1:30 pm) 184/91  HR 70 (while on phone)  2. Are you having any other symptoms (ex. Dizziness, headache, blurred vision, passed out)?  Patient states she is having chills  3. What is your BP issue?   Patient is concern about her high BP readings and generally "not feeling well".  Patient also stated she woke up this morning at 4:45 am she felt jittery.  Patient wants call back on next steps.

## 2023-01-27 ENCOUNTER — Ambulatory Visit: Payer: PPO | Attending: Cardiology | Admitting: Cardiology

## 2023-01-27 ENCOUNTER — Encounter: Payer: Self-pay | Admitting: Cardiology

## 2023-01-27 VITALS — BP 136/72 | HR 70 | Ht 64.0 in | Wt 241.2 lb

## 2023-01-27 DIAGNOSIS — I4819 Other persistent atrial fibrillation: Secondary | ICD-10-CM

## 2023-01-27 DIAGNOSIS — E785 Hyperlipidemia, unspecified: Secondary | ICD-10-CM

## 2023-01-27 DIAGNOSIS — R0789 Other chest pain: Secondary | ICD-10-CM

## 2023-01-27 DIAGNOSIS — G4733 Obstructive sleep apnea (adult) (pediatric): Secondary | ICD-10-CM | POA: Diagnosis not present

## 2023-01-27 DIAGNOSIS — I10 Essential (primary) hypertension: Secondary | ICD-10-CM

## 2023-01-27 DIAGNOSIS — I351 Nonrheumatic aortic (valve) insufficiency: Secondary | ICD-10-CM

## 2023-01-27 DIAGNOSIS — R002 Palpitations: Secondary | ICD-10-CM

## 2023-01-27 DIAGNOSIS — I340 Nonrheumatic mitral (valve) insufficiency: Secondary | ICD-10-CM | POA: Diagnosis not present

## 2023-01-27 DIAGNOSIS — I5032 Chronic diastolic (congestive) heart failure: Secondary | ICD-10-CM

## 2023-01-27 MED ORDER — CARVEDILOL 6.25 MG PO TABS: 6.2500 mg | ORAL_TABLET | Freq: Two times a day (BID) | ORAL | 3 refills | Status: DC

## 2023-01-27 MED ORDER — CARVEDILOL 12.5 MG PO TABS
12.5000 mg | ORAL_TABLET | Freq: Two times a day (BID) | ORAL | 3 refills | Status: DC
Start: 2023-01-27 — End: 2023-03-05

## 2023-01-27 NOTE — Patient Instructions (Signed)
Medication Instructions:  Increase Carvedilol 12.5 mg twice a day *If you need a refill on your cardiac medications before your next appointment, please call your pharmacy*   Lab Work: Remember to have Bmet done in 10 days If you have labs (blood work) drawn today and your tests are completely normal, you will receive your results only by: MyChart Message (if you have MyChart) OR A paper copy in the mail If you have any lab test that is abnormal or we need to change your treatment, we will call you to review the results.   Testing/Procedures: No Testing   Follow-Up: At [facility], you and your health needs are our priority.  As part of our continuing mission to provide you with exceptional heart care, we have created designated Provider Care Teams.  These Care Teams include your primary Cardiologist (physician) and Advanced Practice Providers (APPs -  Physician Assistants and Nurse Practitioners) who all work together to provide you with the care you need, when you need it.  We recommend signing up for the patient portal called "MyChart".  Sign up information is provided on this After Visit Summary.  MyChart is used to connect with patients for Virtual Visits (Telemedicine).  Patients are able to view lab/test results, encounter notes, upcoming appointments, etc.  Non-urgent messages can be sent to your provider as well.   To learn more about what you can do with MyChart, go to [email or web address].    Your next appointment:   4-6 week(s)  Provider:   Robet Leu, PA-C or Bernadene Person, NP Then, Thurmon Fair, MD will plan to see you again in 3-4 month(s).

## 2023-02-03 ENCOUNTER — Other Ambulatory Visit: Payer: Self-pay | Admitting: Family Medicine

## 2023-02-03 DIAGNOSIS — Z1231 Encounter for screening mammogram for malignant neoplasm of breast: Secondary | ICD-10-CM

## 2023-02-05 ENCOUNTER — Other Ambulatory Visit: Payer: Self-pay

## 2023-02-05 ENCOUNTER — Encounter: Payer: Self-pay | Admitting: Family Medicine

## 2023-02-05 DIAGNOSIS — I10 Essential (primary) hypertension: Secondary | ICD-10-CM

## 2023-02-05 DIAGNOSIS — Z79899 Other long term (current) drug therapy: Secondary | ICD-10-CM | POA: Diagnosis not present

## 2023-02-06 DIAGNOSIS — G4733 Obstructive sleep apnea (adult) (pediatric): Secondary | ICD-10-CM | POA: Diagnosis not present

## 2023-02-06 LAB — BASIC METABOLIC PANEL
BUN/Creatinine Ratio: 21 (ref 12–28)
BUN: 32 mg/dL — ABNORMAL HIGH (ref 8–27)
CO2: 21 mmol/L (ref 20–29)
Calcium: 8.9 mg/dL (ref 8.7–10.3)
Chloride: 103 mmol/L (ref 96–106)
Creatinine, Ser: 1.52 mg/dL — ABNORMAL HIGH (ref 0.57–1.00)
Glucose: 97 mg/dL (ref 70–99)
Potassium: 4.5 mmol/L (ref 3.5–5.2)
Sodium: 140 mmol/L (ref 134–144)
eGFR: 35 mL/min/{1.73_m2} — ABNORMAL LOW (ref >59)

## 2023-02-10 DIAGNOSIS — I480 Paroxysmal atrial fibrillation: Secondary | ICD-10-CM | POA: Diagnosis not present

## 2023-02-11 NOTE — Addendum Note (Signed)
Encounter addended by: Shona Simpson, RN on: 02/11/2023 9:58 AM  Actions taken: Imaging Exam ended

## 2023-02-13 ENCOUNTER — Other Ambulatory Visit: Payer: Self-pay | Admitting: Nurse Practitioner

## 2023-02-23 DIAGNOSIS — G4733 Obstructive sleep apnea (adult) (pediatric): Secondary | ICD-10-CM | POA: Diagnosis not present

## 2023-03-05 ENCOUNTER — Encounter: Payer: Self-pay | Admitting: Cardiovascular Disease

## 2023-03-05 ENCOUNTER — Ambulatory Visit: Payer: PPO | Attending: Cardiovascular Disease | Admitting: Cardiovascular Disease

## 2023-03-05 VITALS — BP 147/78 | HR 65 | Ht 64.0 in | Wt 242.6 lb

## 2023-03-05 DIAGNOSIS — I4719 Other supraventricular tachycardia: Secondary | ICD-10-CM | POA: Diagnosis not present

## 2023-03-05 DIAGNOSIS — M109 Gout, unspecified: Secondary | ICD-10-CM | POA: Diagnosis not present

## 2023-03-05 DIAGNOSIS — E039 Hypothyroidism, unspecified: Secondary | ICD-10-CM | POA: Diagnosis not present

## 2023-03-05 DIAGNOSIS — I10 Essential (primary) hypertension: Secondary | ICD-10-CM | POA: Diagnosis not present

## 2023-03-05 DIAGNOSIS — F419 Anxiety disorder, unspecified: Secondary | ICD-10-CM

## 2023-03-05 DIAGNOSIS — I4819 Other persistent atrial fibrillation: Secondary | ICD-10-CM

## 2023-03-05 DIAGNOSIS — I5032 Chronic diastolic (congestive) heart failure: Secondary | ICD-10-CM

## 2023-03-05 DIAGNOSIS — Z79899 Other long term (current) drug therapy: Secondary | ICD-10-CM

## 2023-03-05 DIAGNOSIS — Z5181 Encounter for therapeutic drug level monitoring: Secondary | ICD-10-CM | POA: Diagnosis not present

## 2023-03-05 DIAGNOSIS — D6869 Other thrombophilia: Secondary | ICD-10-CM

## 2023-03-05 DIAGNOSIS — N1831 Chronic kidney disease, stage 3a: Secondary | ICD-10-CM | POA: Diagnosis not present

## 2023-03-05 MED ORDER — PREDNISONE 20 MG PO TABS: ORAL_TABLET | ORAL | 0 refills | Status: DC

## 2023-03-05 MED ORDER — ALPRAZOLAM 0.25 MG PO TABS: 0.2500 mg | ORAL_TABLET | Freq: Two times a day (BID) | ORAL | 0 refills | Status: DC | PRN

## 2023-03-05 MED ORDER — ALPRAZOLAM 0.25 MG PO TABS: 0.2500 mg | ORAL_TABLET | ORAL | 0 refills | Status: DC | PRN

## 2023-03-05 MED ORDER — CARVEDILOL 12.5 MG PO TABS: ORAL_TABLET | ORAL | 3 refills | Status: DC

## 2023-03-05 NOTE — Patient Instructions (Signed)
Medication Instructions:  Increase Carvedilol to 25 mg in the morning and 12.5 mg at night Prednisone 20 mg a day- for 3 days Alprazolam 0.25 mg- take 1-2 tablets before plane ride there and back.  *If you need a refill on your cardiac medications before your next appointment, please call your pharmacy*   Lab Work: Uric Acid, TSH If you have labs (blood work) drawn today and your tests are completely normal, you will receive your results only by: MyChart Message (if you have MyChart) OR A paper copy in the mail If you have any lab test that is abnormal or we need to change your treatment, we will call you to review the results.   Follow-Up: At [facility], you and your health needs are our priority.  As part of our continuing mission to provide you with exceptional heart care, we have created designated Provider Care Teams.  These Care Teams include your primary Cardiologist (physician) and Advanced Practice Providers (APPs -  Physician Assistants and Nurse Practitioners) who all work together to provide you with the care you need, when you need it.  We recommend signing up for the patient portal called "MyChart".  Sign up information is provided on this After Visit Summary.  MyChart is used to connect with patients for Virtual Visits (Telemedicine).  Patients are able to view lab/test results, encounter notes, upcoming appointments, etc.  Non-urgent messages can be sent to your provider as well.   To learn more about what you can do with MyChart, go to [email or web address].    Your next appointment:   6 month(s)  Provider:   Thurmon Fair, MD

## 2023-03-05 NOTE — Progress Notes (Signed)
Cardiology Office Note:    Date:  03/05/2023   ID:  Loran, Rigoli 1945-08-05, MRN [id number]  PCP:  Shon Hale, MD  Cardiologist:  Thurmon Fair, MD    Referring MD: Shon Hale, *   No chief complaint on file.    History of Present Illness:    Leslie [name] is a 77 y.o. female with a hx of Persistent atrial fibrillation and diastolic heart failure returning for follow-up.  Additional medical problems include OSA, HTN, prediabetes, HLP, CKD 3A, moderate MR, moderate AI, morbid obesity.  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  Cardiac catheterization showed normal coronary arteries.  Flecainide and verapamil were discontinued and she was switched to amiodarone and metoprolol, as well as Entresto.  Follow-up echocardiogram 05/22/2022 showed complete normalization of left ventricular systolic function with EF 65 to 70%.  She continues to have palpitations.  She wore a 2-week monitor last month that showed 179 episodes of SVT, most nonsustained, but some up to 15 minutes in duration.  The rate was not particularly fast (the longest episode at a rate of 114 bpm).  Her symptoms did not clearly track with the SVT, the tracings showed sinus rhythm.  She has been taking amiodarone for almost a year now.  Her dose of carvedilol was increased to 12.5 mg twice daily roughly 1 month ago.  She continues to have fairly frequent palpitations, but she has not had a documented episode of breakthrough atrial fibrillation since April 2024.  She is very attuned to her rhythm and feels just about her repeated falls out of state.  While in the office today she checked in with a heart rate of 65 bpm but while we are talking her heart rate abruptly increased to 99 bpm, regular rhythm.  She is compliant with CPAP and denies daytime hypersomnolence.  She is lost a little weight.  She denies orthopnea, PND but does have bilateral  lower extremity edema.  She has not had dizziness or syncope.  She has not had falls or bleeding with Eliquis.  Recent hemoglobin was 10.1.  She has developed redness, swelling and pain in her right first MTP joint, strongly suggestive of podagra, although she is not exquisitely tender.  She is very anxious about a planned plane trip to [facility] for Thanksgiving.  She has not flown in many years and is extremely apprehensive.  She feels that she has panic attacks and is worried how she will do with the flight.  Metabolic control remains good.  Most recent hemoglobin A1c was 5.9% and LDL was 85.  She does not have visible coronary plaque on imaging studies and has not had clinically evident PAD or CAD.  Most recent creatinine was elevated at 1.52 (previously 1.72, typical baseline seems to be around 1.2).  TSH was normal at 2.91 in July.  She is on chronic levothyroxine supplementation.   Initially presented with persistent atrial fibrillation in April 2018, had a difficult time with rate control and underwent TEE guided cardioversion.  Beta-blocker dose was decreased in May 2018 when she had junctional rhythm.  After she received her second dose of coronavirus vaccine in February 2021 she developed symptomatic persistent atrial fibrillation with RVR, again challenging to rate control and underwent cardioversion on June 24, 2019.  She then had to undergo cardioversion twice in 2022: First in January, with early recurrence of atrial fibrillation after which flecainide was started;  second cardioversion in February after which she developed pulmonary edema and required brief hospitalization. In December 2023 she presented with acute systolic heart failure with EF 25-30% and severe hypertension.  Probable Takotsubo syndrome.  Flecainide and verapamil were discontinued but she continued on metoprolol and added Entresto and amiodarone.  Follow-up echo in February 2024 showed complete normalization of LV  function. A nuclear stress test on May 26, 2020 did not show any evidence of ischemia.   Echocardiogram on June 01, 2020 showed LVEF 55-60% with "pseudo normal" mitral inflow and no major valvular abnormalities (mild mitral regurgitation and moderate aortic regurgitation, unchanged from the previous one performed in March 2021.  The left atrium is moderately dilated.  LVEF is normal.  "Pseudonormal" mitral inflow was described, but this was performed relatively soon after her cardioversion0  She has iron deficiency anemia and is receiving iron infusions.  She was intolerant of oral iron due to severe constipation.  Additional problems include morbid obesity, obstructive sleep apnea on CPAP and treated hypertension.     Past Medical History:  Diagnosis Date   Adenomatous polyp    Arthritis    "knees, legs, fingers" (07/30/2016)   Asthma    Bursitis of left shoulder    "just finished PT" (07/30/2016)   Chest pain    a. 2003 Abnl stress test-->Cath: nonobs CAD.   CHF (congestive heart failure) (HCC)    Chronic bronchitis (HCC)    Chronic kidney disease    stage 3   GERD (gastroesophageal reflux disease)    barrett's esophagus- Dr. Matthias Hughs   History of hiatal hernia    Hyperlipidemia    Hypertension    Hypothyroidism    Migraines    "sporatic; at least a few/year" (07/30/2016)   Mitral regurgitation    a. 07/2016 Echo: mild to mod MR;  b. 07/2016 TEE: mild MR.   Moderate aortic insufficiency    a. 07/2016 Echo: EF 55-60%, mod AI;  b. 07/2016 TEE: EF 50-55%, mod AI.   Obesity    s/p lap band surgery 6/10   OSA on CPAP    PAF (paroxysmal atrial fibrillation) (HCC)    a. 07/2016 TEE/DCCV: EF 50-55%, mild MR, mod AI, mild to mod TR, neg bubble study-->successful DCCV x1 w/ 120J.   PONV (postoperative nausea and vomiting)    Vitamin D deficiency     Past Surgical History:  Procedure Laterality Date   APPENDECTOMY     incidental   CARDIOVERSION N/A 08/01/2016   Procedure:  CARDIOVERSION;  Surgeon: Jake Bathe, MD;  [facility] END[facility];  Service: Cardiovascular;  Laterality: N/A;   CARDIOVERSION N/A 06/24/2019   Procedure: CARDIOVERSION;  Surgeon: Thurmon Fair, MD;  Location: [facility] ENDOSCOPY;  Service: Cardiovascular;  Laterality: N/A;   CARDIOVERSION N/A 05/30/2020   Procedure: CARDIOVERSION;  Surgeon: Thurmon Fair, MD;  Location: [facility] ENDOSCOPY;  Service: Cardiovascular;  Laterality: N/A;   CARDIOVERSION N/A 05/09/2020   Procedure: CARDIOVERSION;  Surgeon: Little Ishikawa, MD;  Location: [facility] ENDOSCOPY;  Service: Cardiovascular;  Laterality: N/A;   CATARACT EXTRACTION W/ INTRAOCULAR LENS  IMPLANT, BILATERAL Bilateral ~ 2008-2017   left - right   COMBINED HYSTERECTOMY VAGINAL / OOPHORECTOMY / A&P REPAIR  1981   HERNIA REPAIR     INGUINAL HERNIA REPAIR Left 1952   LAPAROSCOPIC GASTRIC BANDING WITH HIATAL HERNIA REPAIR  09/2008   RIGHT/LEFT HEART CATH AND CORONARY ANGIOGRAPHY N/A 04/11/2022   Procedure: RIGHT/LEFT HEART CATH AND CORONARY ANGIOGRAPHY;  Surgeon: Corky Crafts, MD;  Location: [facility] INVASIVE CV LAB;  Service: Cardiovascular;  Laterality: N/A;   TEE WITHOUT CARDIOVERSION N/A 08/01/2016   Procedure: TRANSESOPHAGEAL ECHOCARDIOGRAM (TEE);  Surgeon: Jake Bathe, MD;  Location: [facility] ENDOSCOPY;  Service: Cardiovascular;  Laterality: N/A;   TONSILLECTOMY AND ADENOIDECTOMY  1974   VAGINAL HYSTERECTOMY  1981   "w/1 ovary"    Current Medications: Current Meds  Medication Sig   acetaminophen (TYLENOL) 650 MG CR tablet Take 1,300 mg by mouth at bedtime as needed for pain.   ALPRAZolam (XANAX) 0.25 MG tablet Take 1 tablet (0.25 mg total) by mouth as needed for anxiety (take 1 to 2 tablets for each of your upcoming flights).   amiodarone (PACERONE) 200 MG tablet TAKE 1 TABLET(200 MG) BY MOUTH DAILY   amLODipine (NORVASC) 5 MG tablet Take 5 mg by mouth daily.   apixaban (ELIQUIS) 5 MG TABS tablet Take 1 tablet (5 mg total) by mouth 2 (two) times  daily.   ARNUITY ELLIPTA 50 MCG/ACT AEPB Inhale 1 puff into the lungs at bedtime.   atorvastatin (LIPITOR) 40 MG tablet Take 1 tablet (40 mg total) by mouth daily.   Bacillus Coagulans-Inulin (ALIGN PREBIOTIC-PROBIOTIC PO) Taking 2 Gummies by mouth daily   cholecalciferol (VITAMIN D) 1000 UNITS tablet Take 1,000 Units by mouth daily.   diclofenac Sodium (VOLTAREN) 1 % GEL Apply 1 application topically 2 (two) times daily.   fexofenadine (ALLEGRA) 180 MG tablet Take 180 mg by mouth daily.   fluticasone (FLONASE) 50 MCG/ACT nasal spray Place 2 sprays into both nostrils at bedtime.    furosemide (LASIX) 20 MG tablet Take 20 mg daily. May take an additional 20 mg on the days of increase swelling, weight gain of 3 lb overnight or 5 lb in 1 week.   Glucosamine-Chondroitin (OSTEO BI-FLEX REGULAR STRENGTH PO) Take 1 tablet by mouth daily.   levothyroxine (SYNTHROID) 75 MCG tablet Take 75 mcg by mouth every morning.   levothyroxine (SYNTHROID, LEVOTHROID) 50 MCG tablet Take 50 mcg by mouth daily before breakfast.   magnesium oxide (MAG-OX) 400 (240 Mg) MG tablet Take 1 tablet (400 mg total) by mouth daily.   metroNIDAZOLE (METROCREAM) 0.75 % cream Apply 1 application  topically daily.   mometasone (ELOCON) 0.1 % cream Apply 1 application  topically 2 (two) times daily as needed (skin irritation).   Multiple Vitamins-Minerals (CENTRUM MULTIGUMMIES) CHEW Takes 1 gummy by mouth daily   omeprazole (PRILOSEC) 20 MG capsule Take 40 mg by mouth daily before breakfast.   predniSONE (DELTASONE) 20 MG tablet Take 20 mg a day for 3 days   [DISCONTINUED] ALPRAZolam (XANAX) 0.25 MG tablet Take 1 tablet (0.25 mg total) by mouth 2 (two) times daily as needed for anxiety (for plane trip).   [DISCONTINUED] carvedilol (COREG) 12.5 MG tablet Take 1 tablet (12.5 mg total) by mouth 2 (two) times daily.     Allergies:   Clindamycin hcl, Augmentin [amoxicillin-pot clavulanate], Codeine, Demerol [meperidine], and Sulfa  antibiotics   Social History   Socioeconomic History   Marital status: Divorced    Spouse name: Not on file   Number of children: Not on file   Years of education: 16   Highest education level: Not on file  Occupational History   Occupation: Retired  Tobacco Use   Smoking status: Former   Smokeless tobacco: Never   Tobacco comments:    07/30/2016 "social smoker only; in college"  Vaping Use   Vaping status: Never Used  Substance and Sexual Activity  Alcohol use: Yes    Alcohol/week: 2.0 standard drinks of alcohol    Types: 1 Glasses of wine, 1 Standard drinks or equivalent per week    Comment: 07/30/2016 "couple drinks/month"   Drug use: No   Sexual activity: Not Currently  Other Topics Concern   Not on file  Social History Narrative   Not on file   Social Determinants of Health   Financial Resource Strain: Medium Risk (04/12/2022)   Overall Financial Resource Strain (CARDIA)    Difficulty of Paying Living Expenses: Somewhat hard  Food Insecurity: No Food Insecurity (01/02/2023)   Hunger Vital Sign    Worried About Running Out of Food in the Last Year: Never true    Ran Out of Food in the Last Year: Never true  Transportation Needs: No Transportation Needs (01/02/2023)   PRAPARE - Administrator, Civil Service (Medical): No    Lack of Transportation (Non-Medical): No  Physical Activity: Not on file  Stress: Not on file  Social Connections: Not on file     Family History: The patient's family history includes Emphysema in her father; Heart failure in her mother; Hypertension in her father and mother. ROS:   Please see the history of present illness.   All other systems are reviewed and are negative.   EKGs/Labs/Other Studies Reviewed:     EKG:   EKG Interpretation Date/Time:  Wednesday March 05 2023 16:29:51 EST Ventricular Rate:  65 PR Interval:  178 QRS Duration:  86 QT Interval:  424 QTC Calculation: 440 R Axis:   -4  Text  Interpretation: Normal sinus rhythm Normal ECG When compared with ECG of 22-Jan-2023 15:12, QRS axis Shifted left Confirmed by Morse Brueggemann [phone]) on 03/05/2023 4:46:45 PM         Recent Labs: 09/04/2021 Hemoglobin A1c 5.7% Hemoglobin 13.0, creatinine 1.1, potassium 4.4, ALT 13, TSH 2.61  07/26/2022 Creatinine 1.34, potassium 4.5, ALT 8, TSH 5.0.  10/31/2022 TSH 2.91  01/10/2023 hemoglobin 10.1, ALT 18  02/05/2023 creatinine 1.52, potassium 4.5  BMET    Latest Ref Rng & Units 02/05/2023   12:15 PM 01/17/2023    2:59 PM 01/03/2023    9:52 AM  BMP  Glucose 70 - 99 mg/dL 97  94  73   BUN 8 - 27 mg/dL 32  29  26   Creatinine 0.57 - 1.00 mg/dL 2.95  6.21  3.08   BUN/Creat Ratio 12 - 28 21  17     Sodium 134 - 144 mmol/L 140  140  139   Potassium 3.5 - 5.2 mmol/L 4.5  4.8  3.7   Chloride 96 - 106 mmol/L 103  102  104   CO2 20 - 29 mmol/L 21  19  25    Calcium 8.7 - 10.3 mg/dL 8.9  9.7  8.0     Recent Lipid Panel 09/04/2021 Cholesterol 179, HDL 40, LDL 104, triglycerides 200  09/19/2022 Cholesterol 156, HDL 45, LDL 85, triglycerides 153, A1c 5.9%     Component Value Date/Time   CHOL 146 07/31/2016 0435   TRIG 174 (H) 07/31/2016 0435   HDL 32 (L) 07/31/2016 0435   CHOLHDL 4.6 07/31/2016 0435   VLDL 35 07/31/2016 0435   LDLCALC 79 07/31/2016 0435    Physical Exam:    VS:  BP (!) 147/78 (BP Location: Left Arm, Patient Position: Sitting, Cuff Size: Large)   Pulse 65   Ht 5\' 4"  (1.626 m)   Wt 242 lb 9.6 oz (110  kg)   SpO2 99%   BMI 41.64 kg/m     Repeat BP 140/70.  Increasing carvedilol dose.  Wt Readings from Last 3 Encounters:  03/05/23 242 lb 9.6 oz (110 kg)  01/27/23 241 lb 3.2 oz (109.4 kg)  01/22/23 246 lb 3.2 oz (111.7 kg)      General: Alert, oriented x3, no distress, morbidly obese Head: no evidence of trauma, PERRL, EOMI, no exophtalmos or lid lag, no myxedema, no xanthelasma; normal ears, nose and oropharynx Neck: normal jugular venous  pulsations and no hepatojugular reflux; brisk carotid pulses without delay and no carotid bruits Chest: clear to auscultation, no signs of consolidation by percussion or palpation, normal fremitus, symmetrical and full respiratory excursions Cardiovascular: normal position and quality of the apical impulse, regular rhythm, normal first and second heart sounds, no murmurs, rubs or gallops Abdomen: no tenderness or distention, no masses by palpation, no abnormal pulsatility or arterial bruits, normal bowel sounds, no hepatosplenomegaly Extremities: no clubbing, cyanosis or edema; 2+ radial, ulnar and brachial pulses bilaterally; 2+ right femoral, posterior tibial and dorsalis pedis pulses; 2+ left femoral, posterior tibial and dorsalis pedis pulses; no subclavian or femoral bruits Neurological: grossly nonfocal Psych: Normal mood and affect    ASSESSMENT:    1. Persistent atrial fibrillation (HCC)   2. Hypothyroidism, unspecified type   3. Paroxysmal atrial tachycardia (HCC)   4. Encounter for monitoring amiodarone therapy   5. Acquired thrombophilia (HCC)   6. Essential hypertension, benign   7. Chronic diastolic heart failure (HCC)   8. Acute gout involving toe of right foot, unspecified cause   9. Stage 3a chronic kidney disease (HCC)   10. Anxiety      PLAN:    In order of problems listed above:  AFib: Maintaining normal sinus rhythm on amiodarone.  During treatment with flecainide she had an episode of severe acute heart failure, possibly Takotsubo cardiomyopathy, which resolved completely.  Echo continues to show evidence of diastolic dysfunction.  Not a great candidate for ablation due to morbid obesity.  Had a recent breakthrough event on amiodarone.  I think the best choice is to continue amiodarone despite its potential side effects.  CHADSVasc 5-6 (age3, gender, CHF, HTN, +/- preDM).  PAT: Although she did not have any frank atrial fibrillation on the recent 2-week monitor she  had very frequent episodes of sustained but brief atrial tachycardia.  Increase the carvedilol to 25 mg in the morning and continue 12.5 mg in the afternoon.  Watch for hypotension or bradycardia. Amiodarone: Normal recent liver and thyroid function tests.  Needs to have a yearly ophthalmological exam.  Avoid prolonged sun exposure.  Discussed multiple possible drug interactions with amiodarone.  Talked about the possibility of rare but dangerous lung side effects.  She has not had a pulmonary function tests. Anticoagulation: Well-tolerated without bleeding problems. HTN: Blood pressure is a little elevated.  We are increasing the dose of carvedilol. CHF: Appears euvolemic.  Currently NYHA functional class I-2.  On a very low-dose of loop diuretic.  On carvedilol.  No longer on Entresto following normalization of EF.  Even before her episode of Takotsubo cardiomyopathy she had 1 episode of heart failure with preserved ejection fraction that occurred immediately after cardioversion.  There is some underlying cardiomyopathy. 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  She has had worsening renal parameters which may have increased uric acid.  Not a good  candidate for NSAIDs due to anticoagulation and heart failure and not a good candidate for colchicine due to treatment with amiodarone.  Will give her a very short course of prednisone.  Will check uric acid levels. CKD3: Her creatinine has steadily increased, although the most recent labs show improvement.  She does not have diabetes mellitus. Anxiety: Very worried about her upcoming flight.  I gave her a very low-dose of alprazolam to take. OSA: Compliant with CPAP, denies hypersomnolence Obesity: Has lost a little weight.  Aware of the direct relationship between weight and burden of atrial fibrillation. MR/AR: Both moderate MR and moderate AR have been largely unchanged on  repeat echoes over the last 4-5 years Migraines: These do better on on the higher dose of metoprolol.  Verapamil stopped due to low EF, could be restarted if necessary in the future.  Medication Adjustments/Labs and Tests Ordered: Current medicines are reviewed at length with the patient today.  Concerns regarding medicines are outlined above.  Orders Placed This Encounter  Procedures   Uric acid   TSH   EKG 12-Lead    Meds ordered this encounter  Medications   DISCONTD: ALPRAZolam (XANAX) 0.25 MG tablet    Sig: Take 1 tablet (0.25 mg total) by mouth 2 (two) times daily as needed for anxiety (for plane trip).    Dispense:  4 tablet    Refill:  0   carvedilol (COREG) 12.5 MG tablet    Sig: Take 2 tablets (25 mg total) by mouth every morning AND 1 tablet (12.5 mg total) at bedtime.    Dispense:  270 tablet    Refill:  3   predniSONE (DELTASONE) 20 MG tablet    Sig: Take 20 mg a day for 3 days    Dispense:  3 tablet    Refill:  0   ALPRAZolam (XANAX) 0.25 MG tablet    Sig: Take 1 tablet (0.25 mg total) by mouth as needed for anxiety (take 1 to 2 tablets for each of your upcoming flights).    Dispense:  4 tablet    Refill:  0    Patient Instructions  Medication Instructions:  Increase Carvedilol to 25 mg in the morning and 12.5 mg at night Prednisone 20 mg a day- for 3 days Alprazolam 0.25 mg- take 1-2 tablets before plane ride there and back.  *If you need a refill on your cardiac medications before your next appointment, please call your pharmacy*   Lab Work: Uric Acid, TSH If you have labs (blood work) drawn today and your tests are completely normal, you will receive your results only by: MyChart Message (if you have MyChart) OR A paper copy in the mail If you have any lab test that is abnormal or we need to change your treatment, we will call you to review the results.   Follow-Up: At [facility], you and your health needs are our priority.  As part of our  continuing mission to provide you with exceptional heart care, we have created designated Provider Care Teams.  These Care Teams include your primary Cardiologist (physician) and Advanced Practice Providers (APPs -  Physician Assistants and Nurse Practitioners) who all work together to provide you with the care you need, when you need it.  We recommend signing up for the patient portal called "MyChart".  Sign up information is provided on this After Visit Summary.  MyChart is used to connect with patients for Virtual Visits (Telemedicine).  Patients are able to view  lab/test results, encounter notes, upcoming appointments, etc.  Non-urgent messages can be sent to your provider as well.   To learn more about what you can do with MyChart, go to [email or web address].    Your next appointment:   6 month(s)  Provider:   Thurmon Fair, MD       Signed, Thurmon Fair, MD  03/05/2023 6:51 PM    [facility]

## 2023-03-06 DIAGNOSIS — M109 Gout, unspecified: Secondary | ICD-10-CM | POA: Diagnosis not present

## 2023-03-06 DIAGNOSIS — E039 Hypothyroidism, unspecified: Secondary | ICD-10-CM | POA: Diagnosis not present

## 2023-03-07 ENCOUNTER — Telehealth: Payer: Self-pay | Admitting: Emergency Medicine

## 2023-03-07 LAB — URIC ACID: Uric Acid: 7.4 mg/dL (ref 3.1–7.9)

## 2023-03-07 LAB — TSH: TSH: 3.77 u[IU]/mL (ref 0.450–4.500)

## 2023-03-07 NOTE — Telephone Encounter (Signed)
Notified the patient that her lab work was within normal limits for Uric Acid and TSH. She states that her toe is already starting to feel better after being on the prednisone.  She will follow up with her PCP soon

## 2023-03-25 DIAGNOSIS — G4733 Obstructive sleep apnea (adult) (pediatric): Secondary | ICD-10-CM | POA: Diagnosis not present

## 2023-03-27 DIAGNOSIS — E039 Hypothyroidism, unspecified: Secondary | ICD-10-CM | POA: Diagnosis not present

## 2023-03-27 DIAGNOSIS — E790 Hyperuricemia without signs of inflammatory arthritis and tophaceous disease: Secondary | ICD-10-CM | POA: Diagnosis not present

## 2023-03-27 DIAGNOSIS — R42 Dizziness and giddiness: Secondary | ICD-10-CM | POA: Diagnosis not present

## 2023-03-27 DIAGNOSIS — I509 Heart failure, unspecified: Secondary | ICD-10-CM | POA: Diagnosis not present

## 2023-03-27 DIAGNOSIS — I130 Hypertensive heart and chronic kidney disease with heart failure and stage 1 through stage 4 chronic kidney disease, or unspecified chronic kidney disease: Secondary | ICD-10-CM | POA: Diagnosis not present

## 2023-03-27 DIAGNOSIS — N1831 Chronic kidney disease, stage 3a: Secondary | ICD-10-CM | POA: Diagnosis not present

## 2023-03-27 DIAGNOSIS — I10 Essential (primary) hypertension: Secondary | ICD-10-CM | POA: Diagnosis not present

## 2023-03-27 DIAGNOSIS — Z6841 Body Mass Index (BMI) 40.0 and over, adult: Secondary | ICD-10-CM | POA: Diagnosis not present

## 2023-03-27 LAB — LAB REPORT - SCANNED: EGFR: 47

## 2023-03-28 ENCOUNTER — Ambulatory Visit
Admission: RE | Admit: 2023-03-28 | Discharge: 2023-03-28 | Disposition: A | Discharge status: Home / Self Care | Payer: PPO | Source: Ambulatory Visit | Attending: Family Medicine | Admitting: Family Medicine

## 2023-03-28 DIAGNOSIS — Z1231 Encounter for screening mammogram for malignant neoplasm of breast: Secondary | ICD-10-CM | POA: Diagnosis not present

## 2023-04-02 ENCOUNTER — Telehealth: Payer: Self-pay | Admitting: Cardiovascular Disease

## 2023-04-02 MED ORDER — AMLODIPINE BESYLATE 5 MG PO TABS
5.0000 mg | ORAL_TABLET | Freq: Every day | ORAL | 3 refills | Status: DC
  Filled 2023-06-18: qty 90, 90d supply, fill #0

## 2023-04-02 MED ORDER — CARVEDILOL 12.5 MG PO TABS
ORAL_TABLET | ORAL | 3 refills | Status: DC
  Filled 2023-06-18: qty 270, 90d supply, fill #0

## 2023-04-02 NOTE — Telephone Encounter (Signed)
Pt's medications were sent to pt's pharmacy as requested. Confirmation received.  

## 2023-04-02 NOTE — Telephone Encounter (Signed)
*  STAT* If patient is at the pharmacy, call can be transferred to refill team.  1. Which medications need to be refilled? (please list name of each medication and dose if known)   carvedilol (COREG) 12.5 MG tablet (new prescription at new dosage) amLODipine (NORVASC) 5 MG tablet   2. Would you like to learn more about the convenience, safety, & potential cost savings by using the [facility]?   3. Are you open to using the [facility] (Type [facility]. ).  4. Which pharmacy/location (including street and city if local pharmacy) is medication to be sent to?  [id number] - [place], [place] - [address] AT [facility] OF [address]   5. Do they need a 30 day or 90 day supply?   90 day  Patient stated she still has some medication.

## 2023-04-07 ENCOUNTER — Ambulatory Visit: Payer: PPO | Admitting: Cardiovascular Disease

## 2023-04-24 ENCOUNTER — Telehealth: Payer: Self-pay | Admitting: Cardiovascular Disease

## 2023-04-24 NOTE — Telephone Encounter (Signed)
 Called and spoke to patient. Verified name and DOB. Patient report that she is having chest tightness and, upper back pain and nausea. She states the pain woke her up during the night and is at a 6/10 at this time. She said she started feeling bad yesterday. Patient report she's taken all her medication this morning and as prescribed. She deny SOB, headache, blurred vision.pain is radiating to her arm or jaw. Patient was advised to call 911 and go to the ED since she was home alone. Patient verbalized understanding and agree. She will be going to [facility] ED for evaluation.

## 2023-04-24 NOTE — Telephone Encounter (Signed)
   Pt c/o of Chest Pain: STAT if active CP, including tightness, pressure, jaw pain, radiating pain to shoulder/upper arm/back, CP unrelieved by Nitro. Symptoms reported of SOB, nausea, vomiting, sweating.  1. Are you having CP right now? Yes - Achy    2. Are you experiencing any other symptoms (ex. SOB, nausea, vomiting, sweating)? No   3. Is your CP continuous or coming and going? Continuous   4. Have you taken Nitroglycerin? No   5. How long have you been experiencing CP? Last night    6. If NO CP at time of call then end call with telling Pt to call back or call 911 if Chest pain returns prior to return call from triage team.   Pt c/o BP issue: STAT if pt c/o blurred vision, one-sided weakness or slurred speech  1. What are your last 5 BP readings? 149/81  2. Are you having any other symptoms (ex. Dizziness, headache, blurred vision, passed out)? No  3. What is your BP issue? BP has been elevated since last night  Call transferred

## 2023-04-27 NOTE — Telephone Encounter (Signed)
 I do not see an ED visit. Can we please follow up with her?

## 2023-04-28 NOTE — Telephone Encounter (Signed)
 She reports that she did not end up going. She reports that she feels it was more related to back pain and it radiated to chest- pretty sure she strained a muscle in her back. She also was swollen in her legs and took 40 mg of lasix  for 2 days in a row before this back/chest pain. She feels she may have been too dried out and it caused her to be more achey as well. So she drank some gatorade and got hydrated. She reports that she has not had any more chest pain and her back is feeling much better. She even plans to go workout tomorrow. Her only complaint is that ever since this last bout of heart failure, she has not had the energy that she once had. Just not as much energy.  Offered her an appt with an APP if she feels she needs it; but she reports that she does not feel like she needs to be seen. Given ER precautions, she verbalized understanding.

## 2023-05-12 ENCOUNTER — Other Ambulatory Visit (HOSPITAL_BASED_OUTPATIENT_CLINIC_OR_DEPARTMENT_OTHER): Payer: Self-pay

## 2023-05-12 ENCOUNTER — Other Ambulatory Visit: Payer: Self-pay | Admitting: Nurse Practitioner

## 2023-05-12 DIAGNOSIS — I4819 Other persistent atrial fibrillation: Secondary | ICD-10-CM

## 2023-05-12 MED ORDER — ARNUITY ELLIPTA 50 MCG/ACT IN AEPB
1.0000 | INHALATION_SPRAY | Freq: Every day | RESPIRATORY_TRACT | 3 refills | Status: DC
  Filled 2023-08-19: qty 90, 90d supply, fill #0
  Filled 2023-11-24: qty 90, 90d supply, fill #1
  Filled 2024-03-01: qty 90, 90d supply, fill #2

## 2023-05-13 ENCOUNTER — Other Ambulatory Visit (HOSPITAL_BASED_OUTPATIENT_CLINIC_OR_DEPARTMENT_OTHER): Payer: Self-pay

## 2023-05-13 MED ORDER — APIXABAN 5 MG PO TABS
5.0000 mg | ORAL_TABLET | Freq: Two times a day (BID) | ORAL | 1 refills | Status: DC
  Filled 2023-05-13 – 2023-07-10 (×2): qty 180, 90d supply, fill #0
  Filled 2023-10-07: qty 180, 90d supply, fill #1

## 2023-05-13 NOTE — Telephone Encounter (Signed)
Eliquis 5mg  refill request received. Patient is 78 years old, weight-110kg, Crea-1.52 on 02/05/23, Diagnosis-Afib, and last seen by Dr. Royann Shivers on 03/05/23. Dose is appropriate based on dosing criteria. Will send in refill to requested pharmacy.

## 2023-05-14 ENCOUNTER — Other Ambulatory Visit (HOSPITAL_BASED_OUTPATIENT_CLINIC_OR_DEPARTMENT_OTHER): Payer: Self-pay

## 2023-05-14 MED FILL — Amiodarone HCl Tab 200 MG: ORAL | 90 days supply | Qty: 90 | Fill #0 | Status: AC

## 2023-05-15 ENCOUNTER — Other Ambulatory Visit (HOSPITAL_BASED_OUTPATIENT_CLINIC_OR_DEPARTMENT_OTHER): Payer: Self-pay

## 2023-05-21 ENCOUNTER — Encounter: Payer: Self-pay | Admitting: Nurse Practitioner

## 2023-05-21 ENCOUNTER — Ambulatory Visit: Payer: PPO | Attending: Nurse Practitioner | Admitting: Nurse Practitioner

## 2023-05-21 VITALS — BP 110/74 | HR 58 | Ht 64.0 in | Wt 236.0 lb

## 2023-05-21 DIAGNOSIS — G4733 Obstructive sleep apnea (adult) (pediatric): Secondary | ICD-10-CM | POA: Diagnosis not present

## 2023-05-21 DIAGNOSIS — N1831 Chronic kidney disease, stage 3a: Secondary | ICD-10-CM | POA: Diagnosis not present

## 2023-05-21 DIAGNOSIS — E782 Mixed hyperlipidemia: Secondary | ICD-10-CM | POA: Diagnosis not present

## 2023-05-21 DIAGNOSIS — I340 Nonrheumatic mitral (valve) insufficiency: Secondary | ICD-10-CM

## 2023-05-21 DIAGNOSIS — I10 Essential (primary) hypertension: Secondary | ICD-10-CM | POA: Diagnosis not present

## 2023-05-21 DIAGNOSIS — R002 Palpitations: Secondary | ICD-10-CM | POA: Diagnosis not present

## 2023-05-21 DIAGNOSIS — I351 Nonrheumatic aortic (valve) insufficiency: Secondary | ICD-10-CM | POA: Diagnosis not present

## 2023-05-21 DIAGNOSIS — E039 Hypothyroidism, unspecified: Secondary | ICD-10-CM | POA: Diagnosis not present

## 2023-05-21 DIAGNOSIS — I480 Paroxysmal atrial fibrillation: Secondary | ICD-10-CM

## 2023-05-21 DIAGNOSIS — I4719 Other supraventricular tachycardia: Secondary | ICD-10-CM

## 2023-05-21 DIAGNOSIS — I5032 Chronic diastolic (congestive) heart failure: Secondary | ICD-10-CM

## 2023-05-21 DIAGNOSIS — I5181 Takotsubo syndrome: Secondary | ICD-10-CM | POA: Diagnosis not present

## 2023-05-21 NOTE — Patient Instructions (Addendum)
 Medication Instructions:  Your physician recommends that you continue on your current medications as directed. Please refer to the Current Medication list given to you today.  *If you need a refill on your cardiac medications before your next appointment, please call your pharmacy*   Lab Work: CBC, CMET, TSH today  Testing/Procedures: Your physician has requested that you have an echocardiogram. Echocardiography is a painless test that uses sound waves to create images of your heart. It provides your doctor with information about the size and shape of your heart and how well your heart's chambers and valves are working. This procedure takes approximately one hour. There are no restrictions for this procedure. Please do NOT wear cologne, perfume, aftershave, or lotions (deodorant is allowed). Please arrive 15 minutes prior to your appointment time.  Please note: We ask at that you not bring children with you during ultrasound (echo/ vascular) testing. Due to room size and safety concerns, children are not allowed in the ultrasound rooms during exams. Our front office staff cannot provide observation of children in our lobby area while testing is being conducted. An adult accompanying a patient to their appointment will only be allowed in the ultrasound room at the discretion of the ultrasound technician under special circumstances. We apologize for any inconvenience.    Follow-Up: At [facility], you and your health needs are our priority.  As part of our continuing mission to provide you with exceptional heart care, we have created designated Provider Care Teams.  These Care Teams include your primary Cardiologist (physician) and Advanced Practice Providers (APPs -  Physician Assistants and Nurse Practitioners) who all work together to provide you with the care you need, when you need it.  We recommend signing up for the patient portal called MyChart.  Sign up information is provided  on this After Visit Summary.  MyChart is used to connect with patients for Virtual Visits (Telemedicine).  Patients are able to view lab/test results, encounter notes, upcoming appointments, etc.  Non-urgent messages can be sent to your provider as well.   To learn more about what you can do with MyChart, go to [email or web address].    Your next appointment:    Keep follow up   Provider:   Jerel Balding, MD     Other Instructions

## 2023-05-21 NOTE — Progress Notes (Signed)
 Office Visit    Patient Name: Leslie [name] Date of Encounter: 05/21/2023  Primary Care Provider:  [name], MD Primary Cardiologist:  Jerel Balding, MD  Chief Complaint    78 year old female with a history of  persistent atrial fibrillation, chronic diastolic heart failure, Takotsubo cardiomyopathy, Type II MI in the setting of hypertensive urgency, mitral valve regurgitation, aortic valve regurgitation, hypertension, hyperlipidemia, CKD stage IIIa, hypothyroidism, OSA, obesity, iron  deficiency anemia, and migraines who presents for follow-up related to chest pain.    Past Medical History    Past Medical History:  Diagnosis Date   Adenomatous polyp    Arthritis    knees, legs, fingers (07/30/2016)   Asthma    Bursitis of left shoulder    just finished PT (07/30/2016)   Chest pain    a. 2003 Abnl stress test-->Cath: nonobs CAD.   CHF (congestive heart failure) (HCC)    Chronic bronchitis (HCC)    Chronic kidney disease    stage 3   GERD (gastroesophageal reflux disease)    barrett's esophagus- Dr. Donnald   History of hiatal hernia    Hyperlipidemia    Hypertension    Hypothyroidism    Migraines    sporatic; at least a few/year (07/30/2016)   Mitral regurgitation    a. 07/2016 Echo: mild to mod MR;  b. 07/2016 TEE: mild MR.   Moderate aortic insufficiency    a. 07/2016 Echo: EF 55-60%, mod AI;  b. 07/2016 TEE: EF 50-55%, mod AI.   Obesity    s/p lap band surgery 6/10   OSA on CPAP    PAF (paroxysmal atrial fibrillation) (HCC)    a. 07/2016 TEE/DCCV: EF 50-55%, mild MR, mod AI, mild to mod TR, neg bubble study-->successful DCCV x1 w/ 120J.   PONV (postoperative nausea and vomiting)    Vitamin D deficiency    Past Surgical History:  Procedure Laterality Date   APPENDECTOMY     incidental   CARDIOVERSION N/A 08/01/2016   Procedure: CARDIOVERSION;  Surgeon: Oneil [name] Parchment, MD;  Location: [facility] ENDOSCOPY;  Service: Cardiovascular;  Laterality: N/A;    CARDIOVERSION N/A 06/24/2019   Procedure: CARDIOVERSION;  Surgeon: Balding Jerel, MD;  Location: [facility] ENDOSCOPY;  Service: Cardiovascular;  Laterality: N/A;   CARDIOVERSION N/A 05/30/2020   Procedure: CARDIOVERSION;  Surgeon: Balding Jerel, MD;  Location: [facility] ENDOSCOPY;  Service: Cardiovascular;  Laterality: N/A;   CARDIOVERSION N/A 05/09/2020   Procedure: CARDIOVERSION;  Surgeon: Kate Lonni CROME, MD;  Location: [facility] ENDOSCOPY;  Service: Cardiovascular;  Laterality: N/A;   CATARACT EXTRACTION W/ INTRAOCULAR LENS  IMPLANT, BILATERAL Bilateral ~ 2008-2017   left - right   COMBINED HYSTERECTOMY VAGINAL / OOPHORECTOMY / A&P REPAIR  1981   HERNIA REPAIR     INGUINAL HERNIA REPAIR Left 1952   LAPAROSCOPIC GASTRIC BANDING WITH HIATAL HERNIA REPAIR  09/2008   RIGHT/LEFT HEART CATH AND CORONARY ANGIOGRAPHY N/A 04/11/2022   Procedure: RIGHT/LEFT HEART CATH AND CORONARY ANGIOGRAPHY;  Surgeon: Dann Candyce RAMAN, MD;  Location: [facility] INVASIVE CV LAB;  Service: Cardiovascular;  Laterality: N/A;   TEE WITHOUT CARDIOVERSION N/A 08/01/2016   Procedure: TRANSESOPHAGEAL ECHOCARDIOGRAM (TEE);  Surgeon: Oneil [name] Parchment, MD;  Location: [facility] ENDOSCOPY;  Service: Cardiovascular;  Laterality: N/A;   TONSILLECTOMY AND ADENOIDECTOMY  1974   VAGINAL HYSTERECTOMY  1981   w/1 ovary    Allergies  Allergies  Allergen Reactions   Clindamycin Hcl     Lip and face swelling   Augmentin [Amoxicillin-Pot Clavulanate]  Diarrhea    Did it involve swelling of the face/tongue/throat, SOB, or low BP? No Did it involve sudden or severe rash/hives, skin peeling, or any reaction on the inside of your mouth or nose? No Did you need to seek medical attention at a hospital or doctor's office? No When did it last happen?      10 + years If all above answers are "NO", may proceed with cephalosporin use.    Codeine Nausea Only and Other (See Comments)    Hyperactivity   Demerol [Meperidine] Nausea And Vomiting   Sulfa Antibiotics  Rash     Labs/Other Studies Reviewed    The following studies were reviewed today:  Cardiac Studies & Procedures   CARDIAC CATHETERIZATION  CARDIAC CATHETERIZATION 04/11/2022  Narrative   LV end diastolic pressure is mildly elevated.   Hemodynamic findings consistent with mild pulmonary hypertension.   There is no aortic valve stenosis.   On 4L [place]: Ao sat 92%, PA sat 62%, PA pressure 48/20, mean PA pressure 32 mmHg, mean right atrial pressure 8 mmHg, mean pulmonary capillary wedge pressure 15 mmHg, cardiac output 7.09 L/min, cardiac index 3.13.  Mild, nonobstructive coronary artery disease.  Mild pulmonary artery hypertension.  Nonischemic cardiomyopathy.  Continue medical therapy.  Findings Coronary Findings Diagnostic  Dominance: Right  Left Anterior Descending The vessel exhibits minimal luminal irregularities.  Left Circumflex The vessel exhibits minimal luminal irregularities.  Right Coronary Artery The vessel exhibits minimal luminal irregularities.  Intervention  No interventions have been documented.   STRESS TESTS  MYOCARDIAL PERFUSION IMAGING 05/25/2020  Narrative  There was no ST segment deviation noted during stress.  The study is normal.  This is a low risk study.  Normal pharmacologic nuclear stress test with no evidence for prior infarct or ischemia. LVEF not calculated due to atrial fibrillation.  ECHOCARDIOGRAM  ECHOCARDIOGRAM COMPLETE 05/22/2022  Narrative ECHOCARDIOGRAM REPORT    Patient Name:   Leslie [name] Date of Exam: 05/22/2022 Medical Rec #:  [id number]       Height:       66.0 in Accession #:    [id number]      Weight:       259.0 lb Date of Birth:  20-Jul-1945       BSA:          2.232 m Patient Age:    77 years        BP:           114/72 mmHg Patient Gender: F               HR:           65 bpm. Exam Location:  [address]  Procedure: 2D Echo, 3D Echo, Cardiac Doppler and Color Doppler  Indications:     I42.9  Cardiomyopathy  History:         Patient has prior history of Echocardiogram examinations, most recent 04/09/2022. CHF, CAD and Previous Myocardial Infarction, Arrythmias:Atrial Fibrillation; Risk Factors:Hypertension, Family History of Coronary Artery Disease, Dyslipidemia and Former Smoker. 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  Sonographer:     Heather Hawks RDCS Referring Phys:  DAMIEN [name] BRAVER Diagnosing Phys: Annabella Scarce MD  IMPRESSIONS   1. Left ventricular ejection fraction, by estimation, is 65 to 70%. The left ventricle has normal function. The left ventricle has no regional wall motion abnormalities. Left ventricular diastolic parameters are consistent with Grade II diastolic dysfunction (pseudonormalization). Elevated left ventricular end-diastolic pressure. 2. Right ventricular systolic  function is normal. The right ventricular size is normal. There is normal pulmonary artery systolic pressure. 3. Left atrial size was severely dilated. 4. The mitral valve is normal in structure. Moderate mitral valve regurgitation. No evidence of mitral stenosis. 5. The aortic valve is tricuspid. Aortic valve regurgitation is moderate. No aortic stenosis is present. Aortic regurgitation PHT measures 653 msec. 6. The inferior vena cava is normal in size with greater than 50% respiratory variability, suggesting right atrial pressure of 3 mmHg.  FINDINGS Left Ventricle: Left ventricular ejection fraction, by estimation, is 65 to 70%. The left ventricle has normal function. The left ventricle has no regional wall motion abnormalities. The left ventricular internal cavity size was normal in size. There is no left ventricular hypertrophy. Left ventricular diastolic parameters are consistent with Grade II diastolic dysfunction (pseudonormalization). Elevated left ventricular end-diastolic pressure.  Right Ventricle: The right ventricular size is normal. No increase in right ventricular  wall thickness. Right ventricular systolic function is normal. There is normal pulmonary artery systolic pressure. The tricuspid regurgitant velocity is 2.49 m/s, and with an assumed right atrial pressure of 3 mmHg, the estimated right ventricular systolic pressure is 27.8 mmHg.  Left Atrium: Left atrial size was severely dilated.  Right Atrium: Right atrial size was normal in size.  Pericardium: There is no evidence of pericardial effusion.  Mitral Valve: The mitral valve is normal in structure. Moderate mitral valve regurgitation. No evidence of mitral valve stenosis.  Tricuspid Valve: The tricuspid valve is normal in structure. Tricuspid valve regurgitation is mild . No evidence of tricuspid stenosis.  Aortic Valve: The aortic valve is tricuspid. Aortic valve regurgitation is moderate. Aortic regurgitation PHT measures 653 msec. No aortic stenosis is present.  Pulmonic Valve: The pulmonic valve was normal in structure. Pulmonic valve regurgitation is trivial. No evidence of pulmonic stenosis.  Aorta: The aortic root is normal in size and structure.  Venous: The inferior vena cava is normal in size with greater than 50% respiratory variability, suggesting right atrial pressure of 3 mmHg.  IAS/Shunts: No atrial level shunt detected by color flow Doppler.   LEFT VENTRICLE PLAX 2D LVIDd:         5.10 cm   Diastology LVIDs:         3.00 cm   LV e' medial:    6.64 cm/s LV PW:         0.90 cm   LV E/e' medial:  17.8 LV IVS:        0.90 cm   LV e' lateral:   6.85 cm/s LVOT diam:     2.00 cm   LV E/e' lateral: 17.2 LV SV:         100 LV SV Index:   45 LVOT Area:     3.14 cm  3D Volume EF: 3D EF:        61 % LV EDV:       156 ml LV ESV:       61 ml LV SV:        95 ml  RIGHT VENTRICLE RV Basal diam:  3.10 cm RV S prime:     19.40 cm/s TAPSE (M-mode): 2.5 cm  LEFT ATRIUM              Index        RIGHT ATRIUM           Index LA diam:        5.20 cm  2.33 cm/m  RA Area:      17.60 cm LA Vol (A2C):   79.6 ml  35.66 ml/m  RA Volume:   42.30 ml  18.95 ml/m LA Vol (A4C):   102.0 ml 45.70 ml/m LA Biplane Vol: 93.8 ml  42.02 ml/m AORTIC VALVE LVOT Vmax:   150.50 cm/s LVOT Vmean:  93.800 cm/s LVOT VTI:    0.317 m AI PHT:      653 msec  AORTA Ao Root diam: 2.70 cm Ao Asc diam:  3.60 cm  MITRAL VALVE                TRICUSPID VALVE MV Area (PHT)  cm          TR Peak grad:   24.8 mmHg MV Decel Time: 214 msec     TR Vmax:        249.00 cm/s MR Peak grad: 121.0 mmHg MR Mean grad: 73.0 mmHg     SHUNTS MR Vmax:      550.00 cm/s   Systemic VTI:  0.32 m MR Vmean:     398.0 cm/s    Systemic Diam: 2.00 cm MV E velocity: 118.00 cm/s MV A velocity: 101.40 cm/s MV E/A ratio:  1.16  Annabella Scarce MD Electronically signed by Annabella Scarce MD Signature Date/Time: 05/22/2022/1:48:29 PM    Final (Updated)  TEE  ECHO TEE 08/01/2016  Narrative *[place]* *[facility]* [address] [place], [place] [address] [phone]  ------------------------------------------------------------------- Transesophageal Echocardiography  Patient:    Nixon, Sparr MR #:       [id number] Study Date: 08/01/2016 Gender:     F Age:        71 Height:     167.6 cm Weight:     132.7 kg BSA:        2.56 m^2 Pt. Status: Room:  PERFORMING   Oneil Parchment, M.D. ADMITTING    Jerel Balding, MD ATTENDING    Jerel Balding, MD SONOGRAPHER  Tinnie Barefoot, RDCS, CCT ORDERING     Sebastian Collar R  cc:  ------------------------------------------------------------------- LV EF: 50% -   55%  ------------------------------------------------------------------- Indications:      Atrial fibrillation - 427.31.  ------------------------------------------------------------------- Study Conclusions  - Left ventricle: Systolic function was normal. The estimated ejection fraction was in the range of 50% to 55%. Wall motion was normal; there were no  regional wall motion abnormalities. - Aortic valve: There was moderate regurgitation. - Mitral valve: There was mild to moderate regurgitation. - Left atrium: No evidence of thrombus in the atrial cavity or appendage. No evidence of thrombus in the appendage. No evidence of thrombus in the atrial cavity or appendage. - Right atrium: No evidence of thrombus in the atrial cavity or appendage.  ------------------------------------------------------------------- Study data:   Study status:  Routine.  Consent:  The risks, benefits, and alternatives to the procedure were explained to the patient and informed consent was obtained.  Procedure:  Initial setup. The patient was brought to the laboratory. Surface ECG leads were monitored. Sedation. Sedation was administered by anesthesiology staff. Transesophageal echocardiography. A transesophageal probe was inserted by the attending cardiologistwithout difficulty. Image quality was adequate. Intravenous contrast (agitated saline) was administered.  Study completion:  The patient tolerated the procedure well. There were no complications.          Diagnostic transesophageal echocardiography.  2D and color Doppler.  Birthdate:  Patient birthdate: November 10, 1945.  Age:  Patient is 78 yr old.  Sex:  Gender: female.  BMI: 47.2 kg/m^2.  Blood pressure:     116/58  Patient status:  Inpatient.  Study date:  Study date: 08/01/2016. Study time: 10:57 AM.  Location:  Endoscopy.  -------------------------------------------------------------------  ------------------------------------------------------------------- Left ventricle:  Systolic function was normal. The estimated ejection fraction was in the range of 50% to 55%. Wall motion was normal; there were no regional wall motion abnormalities.  ------------------------------------------------------------------- Aortic valve:   Structurally normal valve. Trileaflet; normal thickness leaflets. Cusp  separation was normal.  Doppler:  There was moderate regurgitation.  ------------------------------------------------------------------- Aorta:  There was no atheroma. There was no evidence for dissection. Aortic root: The aortic root was not dilated. Ascending aorta: The ascending aorta was normal in size. Aortic arch: The aortic arch was normal in size. Descending aorta: The descending aorta was normal in size.  ------------------------------------------------------------------- Mitral valve:   Structurally normal valve.   Leaflet separation was normal.  Doppler:  There was mild to moderate regurgitation.  ------------------------------------------------------------------- Left atrium:  The atrium was normal in size.  No evidence of thrombus in the atrial cavity or appendage.  No evidence of thrombus in the appendage.  No evidence of thrombus in the atrial cavity or appendage. The appendage was morphologically a left appendage, multilobulated, and of normal size. Emptying velocity was normal.  ------------------------------------------------------------------- Right ventricle:  The cavity size was normal. Wall thickness was normal. Systolic function was normal.  ------------------------------------------------------------------- Pulmonic valve:    Structurally normal valve.  ------------------------------------------------------------------- Tricuspid valve:   Structurally normal valve.   Leaflet separation was normal.  Doppler:  There was mild regurgitation.  ------------------------------------------------------------------- Pulmonary artery:   The main pulmonary artery was normal-sized.  ------------------------------------------------------------------- Right atrium:  The atrium was normal in size.  No evidence of thrombus in the atrial cavity or appendage. The appendage was morphologically a right  appendage.  ------------------------------------------------------------------- Pericardium:  There was no pericardial effusion.  ------------------------------------------------------------------- Prepared and Electronically Authenticated by  Oneil Parchment, M.D. 2018-04-19T11:50:55  MONITORS  LONG TERM MONITOR (3-14 DAYS) 01/22/2023  Narrative Patch wear time 13 days, 16 hours  Minimum heart rate 50 bpm, max heart rate 167 bpm, average heart rate 67 bpm Predominant underlying rhythm was sinus rhythm 1 VT episode, 8 beats at 111 bpm 179 SVT episodes, fastest 18 beats at 167 bpm, longest 14 minutes 46 seconds, average 114 bpm Less than 1% ventricular and supraventricular ectopy Patient triggered episodes associated with sinus rhythm  Will Camnitz, MD          Recent Labs: 12/31/2022: B Natriuretic Peptide 833.3 01/02/2023: ALT 18; Hemoglobin 9.8; Platelets 262 01/03/2023: Magnesium  2.4 02/05/2023: BUN 32; Creatinine, Ser 1.52; Potassium 4.5; Sodium 140 03/06/2023: TSH 3.770  Recent Lipid Panel    Component Value Date/Time   CHOL 146 07/31/2016 0435   TRIG 174 (H) 07/31/2016 0435   HDL 32 (L) 07/31/2016 0435   CHOLHDL 4.6 07/31/2016 0435   VLDL 35 07/31/2016 0435   LDLCALC 79 07/31/2016 0435    History of Present Illness    78 year old female with the above past medical history including persistent atrial fibrillation, chronic diastolic heart failure, Takotsubo cardiomyopathy, Type II MI in the setting of hypertensive urgency, mitral valve regurgitation, aortic valve regurgitation, hypertension,  hyperlipidemia, CKD stage IIIa, hypothyroidism, OSA, obesity, iron  deficiency anemia, and migraines.    She was initially diagnosed with atrial fibrillation in April 2018.  She had a difficult time with rate control and underwent TEE guided DCCV. She underwent repeat DCCV in March 2021 and again in 2022  at  which time she was started on flecainide . Following her cardioversion in  February 2022 she developed pulmonary edema and required a brief hospitalization. Nuclear stress test in February 2022 showed no evidence of ischemia. Echocardiogram in February 2022 showed EF 55 to 60%, mild mitral valve regurgitation, moderate aortic valve regurgitation, dilated left atrium, normal LVEF. She presented to the ED on 04/09/2022 with shortness of breath, hypertensive urgency with SBP >200. Echocardiogram showed EF 25 to 30%, severely decreased LV function, RWMA, mild LVH, normal RV systolic function, small pericardial effusion, moderate mitral valve regurgitation, moderate aortic valve regurgitation.  Troponin was elevated. Cardiology was consulted and she underwent R/LHC on 04/11/2022 which revealed mild pulmonary hypertension, mild nonobstructive CAD, and ICM. Flecainide  and verapamil  were transitioned to amiodarone  and metoprolol  in the setting of reduced EF.  She did not tolerate Entresto  initially due to hypotension, but this was later resumed. It was thought that her acute systolic heart failure was secondary to Takotsubo/stress-induced cardiomyopathy/type II MI in the setting of hypertensive urgency. Repeat echocardiogram showed EF 65 to 70%, normal LV function, no RWMA, G2 DD, normal RV systolic function, moderate mitral valve regurgitation, moderate aortic valve regurgitation. She was hospitalized from 12/31/2022 to 01/03/2023 in the setting of acute hypoxic respiratory failure secondary to acute on chronic diastolic heart failure.  She improved with IV Lasix .  Cardiac monitor in 01/2023 revealed no episodes of atrial fibrillation, 179 episodes of SVT.  Carvedilol  was increased at the time.  She was last seen in the office on 03/05/2023 and was stable from a cardiac standpoint.  He noted ongoing intermittent palpitations.  Carvedilol  was increased to 25 mg in the morning, 12.5 mg in the afternoon.  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  She contacted our office on 04/24/2023 with concern for chest pain.  She was advised to go to the ED, however, she declined ED evaluation.  On follow-up, she reported that her symptoms were likely related to a strained muscle in her back.  She presents today for follow-up. Her daughter is present by phone. Since her last visit she has been stable overall from a cardiac standpoint. She does report generalized fatigue, decreased activity tolerance, shortness of breath with activity, she notes overall she feels yucky.  She reports ongoing intermittent palpitations, elevated HR (low 100s with activity). She describes her palpitations as an awareness of my heart beating.  She denies any significant chest pain though she will note chest tightness in the setting of elevated blood pressure. She also notes back pain, under her shoulder blades. Overall, BP has been better controlled with increased carvedilol  dosing. She notes that she has not felt well since the time of her hospitalization in September 2024. She continues to exercise, although for short periods of time.  She has lost weight.  She is frustrated and concerned about her ongoing symptoms.  Home Medications    Current Outpatient Medications  Medication Sig Dispense Refill   acetaminophen  (TYLENOL ) 650 MG CR tablet Take 1,300 mg by mouth at bedtime as needed for pain.     ALPRAZolam  (XANAX ) 0.25 MG tablet Take 1 tablet (0.25 mg total) by mouth as needed for anxiety (take 1 to 2 tablets for each of your upcoming flights). 4 tablet 0   amiodarone  (PACERONE ) 200 MG tablet Take 1 tablet (200 mg total) by mouth daily. 90 tablet 3   amLODipine  (NORVASC ) 5 MG tablet Take 1 tablet (5 mg total) by  mouth daily. 90 tablet 3   apixaban  (ELIQUIS ) 5 MG TABS tablet Take 1 tablet (5 mg total) by mouth 2 (two) times daily. 180 tablet 1   ARNUITY ELLIPTA  50 MCG/ACT AEPB Inhale 1 puff into the lungs at bedtime.     atorvastatin  (LIPITOR) 40 MG tablet Take 1  tablet (40 mg total) by mouth daily. 90 tablet 3   Bacillus Coagulans-Inulin (ALIGN PREBIOTIC-PROBIOTIC PO) Taking 2 Gummies by mouth daily     carvedilol  (COREG ) 12.5 MG tablet Take 2 tablets (25 mg total) by mouth every morning AND 1 tablet (12.5 mg total) at bedtime. 270 tablet 3   cholecalciferol (VITAMIN D) 1000 UNITS tablet Take 1,000 Units by mouth daily.     diclofenac Sodium (VOLTAREN) 1 % GEL Apply 1 application topically 2 (two) times daily.     fexofenadine (ALLEGRA) 180 MG tablet Take 180 mg by mouth daily.     fluticasone  (FLONASE ) 50 MCG/ACT nasal spray Place 2 sprays into both nostrils at bedtime.      Fluticasone  Furoate (ARNUITY ELLIPTA ) 50 MCG/ACT AEPB Inhale 1 puff by mouth daily. 90 each 3   furosemide  (LASIX ) 20 MG tablet Take 20 mg daily. May take an additional 20 mg on the days of increase swelling, weight gain of 3 lb overnight or 5 lb in 1 week. 180 tablet 3   Glucosamine-Chondroitin (OSTEO BI-FLEX REGULAR STRENGTH PO) Take 1 tablet by mouth daily.     levothyroxine  (SYNTHROID ) 75 MCG tablet Take 75 mcg by mouth every morning.     magnesium  oxide (MAG-OX) 400 (240 Mg) MG tablet Take 1 tablet (400 mg total) by mouth daily. 90 tablet 3   metroNIDAZOLE  (METROCREAM ) 0.75 % cream Apply 1 application  topically daily.     mometasone  (ELOCON ) 0.1 % cream Apply 1 application  topically 2 (two) times daily as needed (skin irritation).     Multiple Vitamins-Minerals (CENTRUM MULTIGUMMIES) CHEW Takes 1 gummy by mouth daily     omeprazole  (PRILOSEC) 20 MG capsule Take 40 mg by mouth daily before breakfast.     predniSONE  (DELTASONE ) 20 MG tablet Take 20 mg a day for 3 days 3 tablet 0   levothyroxine  (SYNTHROID , LEVOTHROID) 50 MCG tablet Take 50 mcg by mouth daily before breakfast. (Patient not taking: Reported on 05/21/2023)     No current facility-administered medications for this visit.     Review of Systems    She denies pnd, orthopnea, n, v, dizziness, syncope, edema, weight  gain, or early satiety. All other systems reviewed and are otherwise negative except as noted above.   Physical Exam    VS:  BP 110/74   Pulse (!) 58   Ht 5' 4 (1.626 m)   Wt 236 lb (107 kg)   SpO2 95%   BMI 40.51 kg/m   GEN: Well nourished, well developed, in no acute distress. HEENT: normal. Neck: Supple, no JVD, carotid bruits, or masses. Cardiac: RRR, no murmurs, rubs, or gallops. No clubbing, cyanosis, edema.  Radials/DP/PT 2+ and equal bilaterally.  Respiratory:  Respirations regular and unlabored, clear to auscultation bilaterally. GI: Soft, nontender, nondistended, BS + x 4. MS: no deformity or atrophy. Skin: warm and dry, no rash. Neuro:  Strength and sensation are intact. Psych: Normal affect.  Accessory Clinical Findings    ECG personally reviewed by me today - EKG Interpretation Date/Time:  Wednesday May 21 2023 10:54:09 EST Ventricular Rate:  58 PR Interval:  182 QRS Duration:  86 QT Interval:  456  QTC Calculation: 447 R Axis:   -3  Text Interpretation: Sinus bradycardia When compared with ECG of 05-Mar-2023 16:29, No significant change was found Confirmed by Daneen Perkins ([id number]) on 05/21/2023 11:08:21 AM  - no acute changes.   Lab Results  Component Value Date   WBC 8.7 01/02/2023   HGB 9.8 (L) 01/02/2023   HCT 32.1 (L) 01/02/2023   MCV 77.2 (L) 01/02/2023   PLT 262 01/02/2023   Lab Results  Component Value Date   CREATININE 1.52 (H) 02/05/2023   BUN 32 (H) 02/05/2023   NA 140 02/05/2023   K 4.5 02/05/2023   CL 103 02/05/2023   CO2 21 02/05/2023   Lab Results  Component Value Date   ALT 18 01/02/2023   AST 21 01/02/2023   ALKPHOS 82 01/02/2023   BILITOT 0.9 01/02/2023   Lab Results  Component Value Date   CHOL 146 07/31/2016   HDL 32 (L) 07/31/2016   LDLCALC 79 07/31/2016   TRIG 174 (H) 07/31/2016   CHOLHDL 4.6 07/31/2016    Lab Results  Component Value Date   HGBA1C 5.6 07/31/2016    Assessment & Plan    1. Hypertension: BP  well controlled. Continue current antihypertensive regimen.    2. Stress induced/Takotsubo cardiomyopathy/Type II MI in the setting of hypertensive urgency with acute systolic heart failure/chronic diastolic heart failure: Suspected stress-induced cardiomyopathy in the setting of type II MI secondary to hypertensive urgency.  Echo at the time showed EF 25 to 30%, severely decreased LV function, RWMA, mild LVH, normal RV systolic function, small pericardial effusion, moderate mitral valve regurgitation, moderate aortic valve regurgitation. Cath with minimal CAD, mild pulmonary hypertension.  Repeat echocardiogram in 05/2022 showed normalization of EF.  She was hospitalized in September 2024 in the setting of acute on chronic heart failure exacerbation.  She has felt poorly since. She reports generalized fatigue, decreased activity tolerance, shortness of breath with activity. She also notes occasional chest tightness in the setting of elevated blood pressure. Euvolemic and well compensated on exam.  Will check CBC, CMET, TSH today. Will repeat echocardiogram. Continue carvedilol , amlodipine , lasix .   3. Paroxysmal atrial fibrillation: Maintaining NSR on amiodarone  and carvedilol . Verapamil  and flecainide  were discontinued in the setting of stress cardiomyopathy with reduced EF.  She denies any significant palpitations, but does report an awareness of my heart beating.   Continue amiodarone , carvedilol ,  Eliquis . Labs pending as above.    4. Valvular heart disease: Most recent echo with moderate mitral valve regurgitation, moderate aortic valve regurgitation. Euvolemic and well compensated on exam. Repeat echo pending as above.    5. Hyperlipidemia: LDL was 85 in 09/2022.  Monitored and managed per PCP.  Continue Lipitor.   6.  CKD stage IIIa: Creatinine was 1.52 in 01/2023.  Repeat CMET pending as above.    7. Hypothyroidism: TSH was 3.77 in 02/2023.  Repeat TSH pending. Monitored and managed per PCP.  Continue levothyroxine .   8. OSA: Adherent to CPAP.  Denies any concerns.   9. Obesity: Encouraged activity as tolerated.   10. Disposition: Follow-up as scheduled with Dr. Francyne in 08/2023.       Perkins [name] Daneen, NP 05/21/2023, 12:15 PM

## 2023-05-22 ENCOUNTER — Other Ambulatory Visit (HOSPITAL_BASED_OUTPATIENT_CLINIC_OR_DEPARTMENT_OTHER): Payer: Self-pay

## 2023-05-22 LAB — COMPREHENSIVE METABOLIC PANEL
ALT: 26 [IU]/L (ref 0–32)
AST: 23 [IU]/L (ref 0–40)
Albumin: 4.2 g/dL (ref 3.8–4.8)
Alkaline Phosphatase: 93 [IU]/L (ref 44–121)
BUN/Creatinine Ratio: 17 (ref 12–28)
BUN: 23 mg/dL (ref 8–27)
Bilirubin Total: 0.6 mg/dL (ref 0.0–1.2)
CO2: 17 mmol/L — ABNORMAL LOW (ref 20–29)
Calcium: 9.1 mg/dL (ref 8.7–10.3)
Chloride: 105 mmol/L (ref 96–106)
Creatinine, Ser: 1.33 mg/dL — ABNORMAL HIGH (ref 0.57–1.00)
Globulin, Total: 2.4 g/dL (ref 1.5–4.5)
Glucose: 106 mg/dL — ABNORMAL HIGH (ref 70–99)
Potassium: 4.4 mmol/L (ref 3.5–5.2)
Sodium: 141 mmol/L (ref 134–144)
Total Protein: 6.6 g/dL (ref 6.0–8.5)
eGFR: 41 mL/min/{1.73_m2} — ABNORMAL LOW (ref >59)

## 2023-05-22 LAB — CBC
Hematocrit: 27.1 % — ABNORMAL LOW (ref 34.0–46.6)
Hemoglobin: 7.4 g/dL — ABNORMAL LOW (ref 11.1–15.9)
MCH: 19.9 pg — ABNORMAL LOW (ref 26.6–33.0)
MCHC: 27.3 g/dL — ABNORMAL LOW (ref 31.5–35.7)
MCV: 73 fL — ABNORMAL LOW (ref 79–97)
Platelets: 263 10*3/uL (ref 150–450)
RBC: 3.72 x10E6/uL — ABNORMAL LOW (ref 3.77–5.28)
RDW: 16.9 % — ABNORMAL HIGH (ref 11.7–15.4)
WBC: 5.6 10*3/uL (ref 3.4–10.8)

## 2023-05-22 LAB — TSH: TSH: 3.51 u[IU]/mL (ref 0.450–4.500)

## 2023-05-26 ENCOUNTER — Encounter: Payer: Self-pay | Admitting: Cardiovascular Disease

## 2023-05-26 DIAGNOSIS — I480 Paroxysmal atrial fibrillation: Secondary | ICD-10-CM

## 2023-05-26 DIAGNOSIS — D508 Other iron deficiency anemias: Secondary | ICD-10-CM

## 2023-05-26 DIAGNOSIS — I4819 Other persistent atrial fibrillation: Secondary | ICD-10-CM

## 2023-05-27 ENCOUNTER — Other Ambulatory Visit (HOSPITAL_BASED_OUTPATIENT_CLINIC_OR_DEPARTMENT_OTHER): Payer: Self-pay

## 2023-05-27 ENCOUNTER — Other Ambulatory Visit: Payer: Self-pay

## 2023-05-27 MED ORDER — ATORVASTATIN CALCIUM 40 MG PO TABS
40.0000 mg | ORAL_TABLET | Freq: Every morning | ORAL | 3 refills | Status: DC
  Filled 2023-05-27: qty 90, 90d supply, fill #0
  Filled 2023-08-19: qty 90, 90d supply, fill #1
  Filled 2023-11-24: qty 90, 90d supply, fill #2
  Filled 2024-02-09: qty 90, 90d supply, fill #3

## 2023-05-28 ENCOUNTER — Other Ambulatory Visit: Payer: Self-pay | Admitting: Physician Assistant

## 2023-05-28 ENCOUNTER — Telehealth: Payer: Self-pay | Admitting: Internal Medicine

## 2023-05-28 ENCOUNTER — Telehealth: Payer: Self-pay | Admitting: Medical Oncology

## 2023-05-28 DIAGNOSIS — D509 Iron deficiency anemia, unspecified: Secondary | ICD-10-CM

## 2023-05-28 NOTE — Telephone Encounter (Signed)
Referral-[facility] is referring pt back to [facility] for anemia with hgb 7.4. Last appt with Arbutus Ped was jan 2023.

## 2023-05-28 NOTE — Progress Notes (Unsigned)
Leslie [name]  Leslie Hale, MD [address] [place] [address]  DIAGNOSIS: Iron Deficiency anemia-intolerance to oral iron supplements   PRIOR THERAPY: Iron infusion with Venofer 300 Mg IV every week x3 weeks.  Last dose was given September 19, 2020   CURRENT THERAPY: None  INTERVAL HISTORY: Leslie [name] 78 y.o. female returns to the clinic today for a follow-up visit.  The patient was lost to follow-up since 04/24/2021. Her PCP referred her to the clinic due to worsening anemia with a hemoglobin of 7.4 in their office.  Per chart review, it looks like her Hbg has been downtrending. Her Hbg was in the 9's in September 2024. The patient has intolerance to oral iron supplements which exacerbates her constipation and causes rectal bleeding/rectal prolapse. She has been treated with iron infusions in the past and most recent being on 09/19/2020. Of [name], the patient also had a lap band procedure in ~2000.   She is currently seeing Dr. Pati [name] from [facility].  She states that she had an upper endoscopy and colonoscopy in the fall which was normal except for some polyps and "folding" of the rectum.  She also has hemorrhoids.  She denies any findings of stomach ulcers.  Overall, the patient states that she has been feeling "yucky" with relation to her fatigue.  She sometimes has decreased exercise tolerance/dyspnea on exertion and occasional lightheadedness.  She denies any significant visible bleeding except she may have a mild self-limiting nosebleed secondary to using CPAP and dry air.  She also sometimes has rectal bleeding.  She denies any gingival bleeding, hemoptysis, hematemesis, or hematuria.  She has some bruising due to her Eliquis for her A-fib.  She has diastolic heart failure.  She is scheduled for repeat echocardiogram next month on 06/23/2023.     MEDICAL HISTORY: Past Medical History:  Diagnosis Date   Adenomatous polyp    Arthritis     "knees, legs, fingers" (07/30/2016)   Asthma    Bursitis of left shoulder    "just finished PT" (07/30/2016)   Chest pain    a. 2003 Abnl stress test-->Cath: nonobs CAD.   CHF (congestive heart failure) (HCC)    Chronic bronchitis (HCC)    Chronic kidney disease    stage 3   GERD (gastroesophageal reflux disease)    barrett's esophagus- Dr. Matthias [name]   History of hiatal hernia    Hyperlipidemia    Hypertension    Hypothyroidism    Migraines    "sporatic; at least a few/year" (07/30/2016)   Mitral regurgitation    a. 07/2016 Echo: mild to mod MR;  b. 07/2016 TEE: mild MR.   Moderate aortic insufficiency    a. 07/2016 Echo: EF 55-60%, mod AI;  b. 07/2016 TEE: EF 50-55%, mod AI.   Obesity    s/p lap band surgery 6/10   OSA on CPAP    PAF (paroxysmal atrial fibrillation) (HCC)    a. 07/2016 TEE/DCCV: EF 50-55%, mild MR, mod AI, mild to mod TR, neg bubble study-->successful DCCV x1 w/ 120J.   PONV (postoperative nausea and vomiting)    Vitamin D deficiency     ALLERGIES:  is allergic to clindamycin hcl, augmentin [amoxicillin-pot clavulanate], codeine, demerol [meperidine], and sulfa antibiotics.  MEDICATIONS:  Current Outpatient Medications  Medication Sig Dispense Refill   acetaminophen (TYLENOL) 650 MG CR tablet Take 1,300 mg by mouth at bedtime as needed for pain.     ALPRAZolam Prudy Feeler)  0.25 MG tablet Take 1 tablet (0.25 mg total) by mouth as needed for anxiety (take 1 to 2 tablets for each of your upcoming flights). 4 tablet 0   amiodarone (PACERONE) 200 MG tablet Take 1 tablet (200 mg total) by mouth daily. 90 tablet 3   amLODipine (NORVASC) 5 MG tablet Take 1 tablet (5 mg total) by mouth daily. 90 tablet 3   apixaban (ELIQUIS) 5 MG TABS tablet Take 1 tablet (5 mg total) by mouth 2 (two) times daily. 180 tablet 1   ARNUITY ELLIPTA 50 MCG/ACT AEPB Inhale 1 puff into the lungs at bedtime.     atorvastatin (LIPITOR) 40 MG tablet Take 1 tablet (40 mg total) by mouth daily. 90 tablet 3    atorvastatin (LIPITOR) 40 MG tablet Take 1 tablet (40 mg total) by mouth in the morning. 90 tablet 3   Bacillus Coagulans-Inulin (ALIGN PREBIOTIC-PROBIOTIC PO) Taking 2 Gummies by mouth daily     carvedilol (COREG) 12.5 MG tablet Take 2 tablets (25 mg total) by mouth every morning AND 1 tablet (12.5 mg total) at bedtime. 270 tablet 3   cholecalciferol (VITAMIN D) 1000 UNITS tablet Take 1,000 Units by mouth daily.     diclofenac Sodium (VOLTAREN) 1 % GEL Apply 1 application topically 2 (two) times daily.     fexofenadine (ALLEGRA) 180 MG tablet Take 180 mg by mouth daily.     fluticasone (FLONASE) 50 MCG/ACT nasal spray Place 2 sprays into both nostrils at bedtime.      Fluticasone Furoate (ARNUITY ELLIPTA) 50 MCG/ACT AEPB Inhale 1 puff by mouth daily. 90 each 3   furosemide (LASIX) 20 MG tablet Take 20 mg daily. May take an additional 20 mg on the days of increase swelling, weight gain of 3 lb overnight or 5 lb in 1 week. 180 tablet 3   Glucosamine-Chondroitin (OSTEO BI-FLEX REGULAR STRENGTH PO) Take 1 tablet by mouth daily.     levothyroxine (SYNTHROID) 75 MCG tablet Take 75 mcg by mouth every morning.     magnesium oxide (MAG-OX) 400 (240 Mg) MG tablet Take 1 tablet (400 mg total) by mouth daily. 90 tablet 3   metroNIDAZOLE (METROCREAM) 0.75 % cream Apply 1 application  topically daily.     mometasone (ELOCON) 0.1 % cream Apply 1 application  topically 2 (two) times daily as needed (skin irritation).     Multiple Vitamins-Minerals (CENTRUM MULTIGUMMIES) CHEW Takes 1 gummy by mouth daily     omeprazole (PRILOSEC) 20 MG capsule Take 40 mg by mouth daily before breakfast.     predniSONE (DELTASONE) 20 MG tablet Take 20 mg a day for 3 days 3 tablet 0   levothyroxine (SYNTHROID, LEVOTHROID) 50 MCG tablet Take 50 mcg by mouth daily before breakfast. (Patient not taking: Reported on 05/21/2023)     No current facility-administered medications for this visit.    SURGICAL HISTORY:  Past Surgical  History:  Procedure Laterality Date   APPENDECTOMY     incidental   CARDIOVERSION N/A 08/01/2016   Procedure: CARDIOVERSION;  Surgeon: Leslie Bathe, MD;  Location: [facility] ENDOSCOPY;  Service: Cardiovascular;  Laterality: N/A;   CARDIOVERSION N/A 06/24/2019   Procedure: CARDIOVERSION;  Surgeon: Thurmon Fair, MD;  Location: [facility] ENDOSCOPY;  Service: Cardiovascular;  Laterality: N/A;   CARDIOVERSION N/A 05/30/2020   Procedure: CARDIOVERSION;  Surgeon: Thurmon Fair, MD;  Location: [facility] ENDOSCOPY;  Service: Cardiovascular;  Laterality: N/A;   CARDIOVERSION N/A 05/09/2020   Procedure: CARDIOVERSION;  Surgeon: Little Ishikawa, MD;  Location: [facility] ENDOSCOPY;  Service: Cardiovascular;  Laterality: N/A;   CATARACT EXTRACTION W/ INTRAOCULAR LENS  IMPLANT, BILATERAL Bilateral ~ 2008-2017   left - right   COMBINED HYSTERECTOMY VAGINAL / OOPHORECTOMY / A&P REPAIR  1981   HERNIA REPAIR     INGUINAL HERNIA REPAIR Left 1952   LAPAROSCOPIC GASTRIC BANDING WITH HIATAL HERNIA REPAIR  09/2008   RIGHT/LEFT HEART CATH AND CORONARY ANGIOGRAPHY N/A 04/11/2022   Procedure: RIGHT/LEFT HEART CATH AND CORONARY ANGIOGRAPHY;  Surgeon: Corky Crafts, MD;  Location: [facility] INVASIVE CV LAB;  Service: Cardiovascular;  Laterality: N/A;   TEE WITHOUT CARDIOVERSION N/A 08/01/2016   Procedure: TRANSESOPHAGEAL ECHOCARDIOGRAM (TEE);  Surgeon: Leslie Bathe, MD;  Location: [facility] ENDOSCOPY;  Service: Cardiovascular;  Laterality: N/A;   TONSILLECTOMY AND ADENOIDECTOMY  1974   VAGINAL HYSTERECTOMY  1981   "w/1 ovary"    REVIEW OF SYSTEMS:   Review of Systems  Constitutional: Positive for fatigue.  Negative for appetite change, chills, fever and unexpected weight change.  HENT: Negative for mouth sores, nosebleeds, sore throat and trouble swallowing.   Eyes: Negative for eye problems and icterus.  Respiratory: Positive for dyspnea on exertion. Negative for cough, hemoptysis, and wheezing.   Cardiovascular: Negative for chest  pain and leg swelling.  Gastrointestinal: Positive for occasional rectal bleeding. Negative for abdominal pain, constipation, diarrhea, nausea and vomiting.  Genitourinary: Negative for bladder incontinence, difficulty urinating, dysuria, frequency and hematuria.   Musculoskeletal: Negative for back pain, gait problem, neck pain and neck stiffness.  Skin: Negative for itching and rash.  Neurological: Positive for occasional lightheadedness. Negative for dizziness, extremity weakness, gait problem, headaches, and seizures.  Hematological: Negative for adenopathy. Does not bleed easily. Positive for bruising.  Psychiatric/Behavioral: Negative for confusion, depression and sleep disturbance. The patient is not nervous/anxious.     PHYSICAL EXAMINATION:  Blood pressure (!) 97/54, pulse 68, temperature 97.7 F (36.5 C), temperature source Temporal, resp. rate 13, weight 242 lb 14.4 oz (110.2 kg), SpO2 98%.  ECOG PERFORMANCE STATUS: 1  Physical Exam  Constitutional: Oriented to person, place, and time and well-developed, well-nourished, and in no distress.   HENT:  Head: Normocephalic and atraumatic.  Mouth/Throat: Oropharynx is clear and moist. No oropharyngeal exudate.  Eyes: Conjunctivae are normal. Right eye exhibits no discharge. Left eye exhibits no discharge. No scleral icterus.  Neck: Normal range of motion. Neck supple.  Cardiovascular: Normal rate, regular rhythm, normal heart sounds and intact distal pulses.   Pulmonary/Chest: Effort normal and breath sounds normal. No respiratory distress. No wheezes. No rales.  Abdominal: Soft. Bowel sounds are normal. Exhibits no distension and no mass. There is no tenderness.  Musculoskeletal: Normal range of motion. Exhibits no edema.  Lymphadenopathy:    No cervical adenopathy.  Neurological: Alert and oriented to person, place, and time. Exhibits normal muscle tone. Gait normal. Coordination normal.  Skin: Skin is warm and dry. No rash  noted. Not diaphoretic. No erythema. No pallor.  Psychiatric: Mood, memory and judgment normal.  Vitals reviewed.  LABORATORY DATA: Lab Results  Component Value Date   WBC 6.5 05/29/2023   HGB 7.0 (L) 05/29/2023   HCT 25.2 (L) 05/29/2023   MCV 71.0 (L) 05/29/2023   PLT 251 05/29/2023      Chemistry      Component Value Date/Time   NA 141 05/29/2023 0846   NA 141 05/21/2023 1159   K 3.9 05/29/2023 0846   CL 108 05/29/2023 0846   CO2 26 05/29/2023 0846  BUN 24 (H) 05/29/2023 0846   BUN 23 05/21/2023 1159   CREATININE 1.31 (H) 05/29/2023 0846   CREATININE 1.49 (H) 08/13/2016 1556      Component Value Date/Time   CALCIUM 8.6 (L) 05/29/2023 0846   ALKPHOS 77 05/29/2023 0846   AST 18 05/29/2023 0846   ALT 20 05/29/2023 0846   BILITOT 0.8 05/29/2023 0846       RADIOGRAPHIC STUDIES:  No results found.   ASSESSMENT/PLAN:  This is a very pleasant 78 year old Caucasian female referred to the clinic for iron deficiency anemia likely due to rectal bleeding from constipation. She is also on anti-coagulation due to atrial fibrillation.    The patient had repeat labs including a CBC which showed a hemoglobin of 7.0 and microcytosis.  I anticipate that she will have iron deficiency which will require IV iron infusions.   Therefore, we will arrange for the patient to have 3 weekly iron infusions with Venofer 300 mg x 3 at the [facility].  Will also arrange for her to have a blood transfusion tomorrow. Recommended that she have a driver.    We will see her back for follow-up visit in 2 months for evaluation and to repeat her CBC, iron studies, and ferritin.   She was instructed can to continue to increase her iron in her diet.  She is up to date on her colonoscopy and endoscopy which was last performed by [facility] in the fall of 2024.   I let her know to feel free to call us sooner if she ever has lab work drawn at any of her other providers offices that  show worsening microcytic anemia.  The patient was advised to call immediately if she has any concerning symptoms in the interval. The patient voices understanding of current disease status and treatment options and is in agreement with the current care plan. All questions were answered. The patient knows to call the clinic with any problems, questions or concerns. We can certainly see the patient much sooner if necessary   Orders Placed This Encounter  Procedures   CBC with Differential ([facility]cer Center Only)    Standing Status:   Future    Expected Date:   07/17/2023    Expiration Date:   05/28/2024   Ferritin    Standing Status:   Future    Expected Date:   07/17/2023    Expiration Date:   05/28/2024   Iron and Iron Binding Capacity ([facility] only)    Standing Status:   Future    Expected Date:   07/17/2023    Expiration Date:   05/28/2024   Type and screen         Standing Status:   Future    Expected Date:   05/29/2023    Expiration Date:   05/28/2024   Sample to Blood Bank    Standing Status:   Future    Expected Date:   07/17/2023    Expiration Date:   05/28/2024     The total time spent in the appointment was 20-29 minutes  Braylon Lemmons L Aedin Jeansonne, PA-C 05/29/23

## 2023-05-29 ENCOUNTER — Inpatient Hospital Stay: Payer: PPO

## 2023-05-29 ENCOUNTER — Inpatient Hospital Stay: Payer: PPO | Attending: Internal Medicine

## 2023-05-29 ENCOUNTER — Inpatient Hospital Stay: Payer: PPO | Admitting: Physician Assistant

## 2023-05-29 ENCOUNTER — Telehealth: Payer: Self-pay | Admitting: Pharmacy Technician

## 2023-05-29 VITALS — BP 97/54 | HR 68 | Temp 97.7°F | Resp 13 | Wt 242.9 lb

## 2023-05-29 DIAGNOSIS — I130 Hypertensive heart and chronic kidney disease with heart failure and stage 1 through stage 4 chronic kidney disease, or unspecified chronic kidney disease: Secondary | ICD-10-CM | POA: Diagnosis not present

## 2023-05-29 DIAGNOSIS — I480 Paroxysmal atrial fibrillation: Secondary | ICD-10-CM | POA: Diagnosis not present

## 2023-05-29 DIAGNOSIS — I5030 Unspecified diastolic (congestive) heart failure: Secondary | ICD-10-CM | POA: Insufficient documentation

## 2023-05-29 DIAGNOSIS — Z7901 Long term (current) use of anticoagulants: Secondary | ICD-10-CM | POA: Insufficient documentation

## 2023-05-29 DIAGNOSIS — D509 Iron deficiency anemia, unspecified: Secondary | ICD-10-CM | POA: Diagnosis present

## 2023-05-29 DIAGNOSIS — D500 Iron deficiency anemia secondary to blood loss (chronic): Secondary | ICD-10-CM | POA: Diagnosis not present

## 2023-05-29 LAB — CBC WITH DIFFERENTIAL (CANCER CENTER ONLY)
Abs Immature Granulocytes: 0.02 10*3/uL (ref 0.00–0.07)
Basophils Absolute: 0.1 10*3/uL (ref 0.0–0.1)
Basophils Relative: 1 %
Eosinophils Absolute: 0.2 10*3/uL (ref 0.0–0.5)
Eosinophils Relative: 2 %
HCT: 25.2 % — ABNORMAL LOW (ref 36.0–46.0)
Hemoglobin: 7 g/dL — ABNORMAL LOW (ref 12.0–15.0)
Immature Granulocytes: 0 %
Lymphocytes Relative: 13 %
Lymphs Abs: 0.9 10*3/uL (ref 0.7–4.0)
MCH: 19.7 pg — ABNORMAL LOW (ref 26.0–34.0)
MCHC: 27.8 g/dL — ABNORMAL LOW (ref 30.0–36.0)
MCV: 71 fL — ABNORMAL LOW (ref 80.0–100.0)
Monocytes Absolute: 0.5 10*3/uL (ref 0.1–1.0)
Monocytes Relative: 8 %
Neutro Abs: 5 10*3/uL (ref 1.7–7.7)
Neutrophils Relative %: 76 %
Platelet Count: 251 10*3/uL (ref 150–400)
RBC: 3.55 MIL/uL — ABNORMAL LOW (ref 3.87–5.11)
RDW: 18.4 % — ABNORMAL HIGH (ref 11.5–15.5)
WBC Count: 6.5 10*3/uL (ref 4.0–10.5)
nRBC: 0 % (ref 0.0–0.2)

## 2023-05-29 LAB — CMP (CANCER CENTER ONLY)
ALT: 20 U/L (ref 0–44)
AST: 18 U/L (ref 15–41)
Albumin: 4.1 g/dL (ref 3.5–5.0)
Alkaline Phosphatase: 77 U/L (ref 38–126)
Anion gap: 7 (ref 5–15)
BUN: 24 mg/dL — ABNORMAL HIGH (ref 8–23)
CO2: 26 mmol/L (ref 22–32)
Calcium: 8.6 mg/dL — ABNORMAL LOW (ref 8.9–10.3)
Chloride: 108 mmol/L (ref 98–111)
Creatinine: 1.31 mg/dL — ABNORMAL HIGH (ref 0.44–1.00)
GFR, Estimated: 42 mL/min — ABNORMAL LOW (ref >60)
Glucose, Bld: 107 mg/dL — ABNORMAL HIGH (ref 70–99)
Potassium: 3.9 mmol/L (ref 3.5–5.1)
Sodium: 141 mmol/L (ref 135–145)
Total Bilirubin: 0.8 mg/dL (ref 0.0–1.2)
Total Protein: 6.7 g/dL (ref 6.5–8.1)

## 2023-05-29 LAB — IRON AND IRON BINDING CAPACITY (CC-WL,HP ONLY)
Iron: 22 ug/dL — ABNORMAL LOW (ref 28–170)
Saturation Ratios: 4 % — ABNORMAL LOW (ref 10.4–31.8)
TIBC: 507 ug/dL — ABNORMAL HIGH (ref 250–450)
UIBC: 485 ug/dL — ABNORMAL HIGH (ref 148–442)

## 2023-05-29 LAB — ABO/RH: ABO/RH(D): O NEG

## 2023-05-29 LAB — FERRITIN: Ferritin: 7 ng/mL — ABNORMAL LOW (ref 11–307)

## 2023-05-29 LAB — PREPARE RBC (CROSSMATCH)

## 2023-05-29 LAB — SAMPLE TO BLOOD BANK

## 2023-05-29 NOTE — Telephone Encounter (Signed)
Patient will be scheduled as soon as possible.  Auth Submission: NO AUTH NEEDED Site of care: Site of care: [facility] Payer: [employer] Medication & CPT/J Code(s) submitted: Venofer Route of submission (phone, fax, portal):   Phone # Fax # Auth type: Buy/Bill PB Units/visits requested: 3 doses Reference number:  Approval from: 05/29/23 to 10/26/23

## 2023-05-29 NOTE — Progress Notes (Signed)
Blood bank orders in and released.  Spoke with Tresa Endo in blood bank and confirmed.

## 2023-05-30 ENCOUNTER — Inpatient Hospital Stay: Payer: PPO

## 2023-05-30 DIAGNOSIS — D509 Iron deficiency anemia, unspecified: Secondary | ICD-10-CM | POA: Diagnosis not present

## 2023-05-30 DIAGNOSIS — D500 Iron deficiency anemia secondary to blood loss (chronic): Secondary | ICD-10-CM

## 2023-05-30 MED ORDER — ACETAMINOPHEN 325 MG PO TABS
650.0000 mg | ORAL_TABLET | Freq: Once | ORAL | Status: DC
  Filled 2023-05-30: qty 2

## 2023-05-30 MED ORDER — SODIUM CHLORIDE 0.9% IV SOLUTION
250.0000 mL | INTRAVENOUS | Status: DC
  Administered 2023-05-30: 100 mL via INTRAVENOUS

## 2023-05-30 MED ORDER — DIPHENHYDRAMINE HCL 25 MG PO CAPS
25.0000 mg | ORAL_CAPSULE | Freq: Once | ORAL | Status: AC
  Administered 2023-05-30: 25 mg via ORAL
  Filled 2023-05-30: qty 1

## 2023-05-30 NOTE — Patient Instructions (Signed)

## 2023-05-31 ENCOUNTER — Emergency Department (HOSPITAL_COMMUNITY)
Admission: EM | Admit: 2023-05-31 | Discharge: 2023-05-31 | Disposition: A | Discharge status: Home / Self Care | Payer: PPO | Attending: Emergency Medicine | Admitting: Emergency Medicine

## 2023-05-31 ENCOUNTER — Telehealth: Payer: Self-pay | Admitting: Physician Assistant

## 2023-05-31 ENCOUNTER — Other Ambulatory Visit: Payer: Self-pay

## 2023-05-31 ENCOUNTER — Emergency Department (HOSPITAL_COMMUNITY): Payer: PPO

## 2023-05-31 DIAGNOSIS — N189 Chronic kidney disease, unspecified: Secondary | ICD-10-CM | POA: Insufficient documentation

## 2023-05-31 DIAGNOSIS — I509 Heart failure, unspecified: Secondary | ICD-10-CM | POA: Diagnosis not present

## 2023-05-31 DIAGNOSIS — J45909 Unspecified asthma, uncomplicated: Secondary | ICD-10-CM | POA: Insufficient documentation

## 2023-05-31 DIAGNOSIS — R0981 Nasal congestion: Secondary | ICD-10-CM | POA: Insufficient documentation

## 2023-05-31 DIAGNOSIS — Z7901 Long term (current) use of anticoagulants: Secondary | ICD-10-CM | POA: Insufficient documentation

## 2023-05-31 DIAGNOSIS — Z7951 Long term (current) use of inhaled steroids: Secondary | ICD-10-CM | POA: Insufficient documentation

## 2023-05-31 DIAGNOSIS — Z79899 Other long term (current) drug therapy: Secondary | ICD-10-CM | POA: Diagnosis not present

## 2023-05-31 DIAGNOSIS — I480 Paroxysmal atrial fibrillation: Secondary | ICD-10-CM | POA: Diagnosis not present

## 2023-05-31 DIAGNOSIS — I130 Hypertensive heart and chronic kidney disease with heart failure and stage 1 through stage 4 chronic kidney disease, or unspecified chronic kidney disease: Secondary | ICD-10-CM | POA: Insufficient documentation

## 2023-05-31 DIAGNOSIS — E039 Hypothyroidism, unspecified: Secondary | ICD-10-CM | POA: Insufficient documentation

## 2023-05-31 DIAGNOSIS — R059 Cough, unspecified: Secondary | ICD-10-CM | POA: Diagnosis not present

## 2023-05-31 DIAGNOSIS — R0602 Shortness of breath: Secondary | ICD-10-CM | POA: Diagnosis present

## 2023-05-31 LAB — CBC WITH DIFFERENTIAL/PLATELET
Abs Immature Granulocytes: 0.03 10*3/uL (ref 0.00–0.07)
Basophils Absolute: 0.1 10*3/uL (ref 0.0–0.1)
Basophils Relative: 1 %
Eosinophils Absolute: 0.1 10*3/uL (ref 0.0–0.5)
Eosinophils Relative: 1 %
HCT: 27.3 % — ABNORMAL LOW (ref 36.0–46.0)
Hemoglobin: 8 g/dL — ABNORMAL LOW (ref 12.0–15.0)
Immature Granulocytes: 0 %
Lymphocytes Relative: 15 %
Lymphs Abs: 1 10*3/uL (ref 0.7–4.0)
MCH: 21.2 pg — ABNORMAL LOW (ref 26.0–34.0)
MCHC: 29.3 g/dL — ABNORMAL LOW (ref 30.0–36.0)
MCV: 72.4 fL — ABNORMAL LOW (ref 80.0–100.0)
Monocytes Absolute: 0.6 10*3/uL (ref 0.1–1.0)
Monocytes Relative: 9 %
Neutro Abs: 5 10*3/uL (ref 1.7–7.7)
Neutrophils Relative %: 74 %
Platelets: 247 10*3/uL (ref 150–400)
RBC: 3.77 MIL/uL — ABNORMAL LOW (ref 3.87–5.11)
RDW: 20 % — ABNORMAL HIGH (ref 11.5–15.5)
WBC: 6.8 10*3/uL (ref 4.0–10.5)
nRBC: 0 % (ref 0.0–0.2)

## 2023-05-31 LAB — COMPREHENSIVE METABOLIC PANEL
ALT: 23 U/L (ref 0–44)
AST: 23 U/L (ref 15–41)
Albumin: 3.4 g/dL — ABNORMAL LOW (ref 3.5–5.0)
Alkaline Phosphatase: 66 U/L (ref 38–126)
Anion gap: 11 (ref 5–15)
BUN: 17 mg/dL (ref 8–23)
CO2: 22 mmol/L (ref 22–32)
Calcium: 8.5 mg/dL — ABNORMAL LOW (ref 8.9–10.3)
Chloride: 105 mmol/L (ref 98–111)
Creatinine, Ser: 1.36 mg/dL — ABNORMAL HIGH (ref 0.44–1.00)
GFR, Estimated: 40 mL/min — ABNORMAL LOW (ref >60)
Glucose, Bld: 98 mg/dL (ref 70–99)
Potassium: 3.6 mmol/L (ref 3.5–5.1)
Sodium: 138 mmol/L (ref 135–145)
Total Bilirubin: 1.1 mg/dL (ref 0.0–1.2)
Total Protein: 6.4 g/dL — ABNORMAL LOW (ref 6.5–8.1)

## 2023-05-31 LAB — TROPONIN I (HIGH SENSITIVITY): Troponin I (High Sensitivity): 9 ng/L (ref <18)

## 2023-05-31 MED ORDER — PROPOFOL 10 MG/ML IV BOLUS
100.0000 mg | Freq: Once | INTRAVENOUS | Status: DC
  Filled 2023-05-31: qty 20

## 2023-05-31 MED ORDER — PROPOFOL 10 MG/ML IV BOLUS
INTRAVENOUS | Status: AC | PRN
  Administered 2023-05-31: 50 mg via INTRAVENOUS

## 2023-05-31 MED ORDER — METOPROLOL TARTRATE 5 MG/5ML IV SOLN
5.0000 mg | Freq: Once | INTRAVENOUS | Status: AC
  Administered 2023-05-31: 5 mg via INTRAVENOUS
  Filled 2023-05-31: qty 5

## 2023-05-31 NOTE — ED Notes (Signed)
Pt ambulated to the bathroom with minimal assistance

## 2023-05-31 NOTE — Discharge Instructions (Signed)
Please follow-up with your cardiologist in regards to recent symptoms and ER visit.  Today you are seen and evaluated for your A-fib and converted to normal sinus with cardioversion and were monitored and remained stable.  Please continue taking your medications as prescribed and if symptoms change or worsen please return to ER.

## 2023-05-31 NOTE — ED Provider Notes (Signed)
Physical Exam  BP 129/67   Pulse 65   Temp 98 F (36.7 C) (Oral)   Resp 16   SpO2 97%   Physical Exam  Procedures  .Sedation  Date/Time: 05/31/2023 8:18 PM  Performed by: Charlynne Pander, MD Authorized by: Charlynne Pander, MD   Consent:    Consent obtained:  Verbal   Consent given by:  Patient   Risks discussed:  Allergic reaction, dysrhythmia, inadequate sedation, nausea, prolonged hypoxia resulting in organ damage, prolonged sedation necessitating reversal, respiratory compromise necessitating ventilatory assistance and intubation and vomiting   Alternatives discussed:  Analgesia without sedation, anxiolysis and regional anesthesia Universal protocol:    Procedure explained and questions answered to patient or proxy's satisfaction: yes     Relevant documents present and verified: yes     Test results available: yes     Imaging studies available: yes     Required blood products, implants, devices, and special equipment available: yes     Site/side marked: yes     Immediately prior to procedure, a time out was called: yes     Patient identity confirmed:  Verbally with patient Indications:    Procedure necessitating sedation performed by:  Physician performing sedation Pre-sedation assessment:    Time since last food or drink:  6 hours   ASA classification: class 1 - normal, healthy patient     Mouth opening:  3 or more finger widths   Thyromental distance:  4 finger widths   Mallampati score:  I - soft palate, uvula, fauces, pillars visible   Neck mobility: normal     Pre-sedation assessments completed and reviewed: pre-procedure airway patency not reviewed and pre-procedure mental status not reviewed   A pre-sedation assessment was completed prior to the start of the procedure Immediate pre-procedure details:    Reassessment: Patient reassessed immediately prior to procedure     Reviewed: vital signs, relevant labs/tests and NPO status     Reviewed comment:   Review performed   Verified: bag valve mask available, emergency equipment available, intubation equipment available, IV patency confirmed, oxygen available, reversal medications available and suction available   Procedure details (see MAR for exact dosages):    Preoxygenation:  Nasal cannula   Sedation:  Propofol   Intended level of sedation: deep   Intra-procedure monitoring:  Blood pressure monitoring, cardiac monitor, continuous pulse oximetry, frequent LOC assessments, frequent vital sign checks and continuous capnometry   Intra-procedure events: none     Total Provider sedation time (minutes):  30 Post-procedure details:   A post-sedation assessment was completed following the completion of the procedure.   Attendance: Constant attendance by certified staff until patient recovered     Recovery: Patient returned to pre-procedure baseline     Post-sedation assessments completed and reviewed: airway patency, cardiovascular function, hydration status, mental status, nausea/vomiting, pain level, respiratory function and temperature     Patient is stable for discharge or admission: yes     Procedure completion:  Tolerated well, no immediate complications   ED Course / MDM    Medical Decision Making I provided a substantive portion of the care of this patient.  I personally made/approved the management plan for this patient and take responsibility for the patient management.  EKG Interpretation Date/Time:  Saturday May 31 2023 13:56:06 EST Ventricular Rate:  83 PR Interval:    QRS Duration:  98 QT Interval:  397 QTC Calculation: 467 R Axis:   107  Text Interpretation: Atrial fibrillation Right axis  deviation Low voltage, precordial leads Borderline T abnormalities, anterior leads Confirmed by Eber Hong ([id number]) on 05/31/2023 2:58:06 PM  Leslie [name] is a 78 y.o. female here presenting with A-fib.  Patient felt that her heart went out of rhythm.  Signed out pending  electrolytes which were unremarkable.  Previous team discussed with cardiology and they recommended cardioversion.  She had 3 previous cardioversions with propofol.  I was able to perform propofol conscious sedation.  I supervised the PA performing cardioversion and was present at the bedside.  Patient had no complications.  Told her to continue taking her medicines and follow-up with cardiology    Problems Addressed: Paroxysmal atrial fibrillation [facility] chronic illness or injury  Amount and/or Complexity of Data Reviewed Labs: ordered. Decision-making details documented in ED Course. Radiology: ordered and independent interpretation performed. Decision-making details documented in ED Course.  Risk Prescription drug management.          Charlynne Pander, MD 05/31/23 [phone]

## 2023-05-31 NOTE — Telephone Encounter (Signed)
Patient was awakened in the middle of the night by palpitations.  Her heart rate was up to 120 at times.  It is generally staying between 80 and 110 at this time.  She has taken all of her morning medications including amiodarone 200 mg, carvedilol 25 mg, and Eliquis.  She is very clear that she has not missed any doses of Eliquis.  She continues to feel bad and be very aware of her heart rate.  She does not feel safe at home and the atrial fibrillation.  After discussion, she has decided to go to [facility] hospital and get treatment for this and understands it may include a cardioversion.   Theodore Demark, PA-C 05/31/2023 10:35 AM

## 2023-05-31 NOTE — ED Triage Notes (Signed)
Patient with hx of afib here after feeling herself go into afib last night when getting ready for bed. Blood transfusion yesterday. Afib sustained through the night so she called her cardiologist this morning and they advised her to come to the ED. Rate <90 for EMS transport.

## 2023-05-31 NOTE — ED Provider Notes (Signed)
Hx of afib Subjective palpitations last night and into today. Has paroxysmal afib - required cardioversion in the past At rest feels OK, but when moves - she has palpitations and sob. Mild cough and congestion - no OTC meds.    Eber Hong, MD 06/01/23 0700

## 2023-05-31 NOTE — ED Provider Notes (Signed)
Patient given in sign out by Nechama Guard, PA-C.  Please review their note for patient HPI, physical exam, workup.  At this time the plan is to speak cardiology and plan on cardioversion.  Previous attending Dr. Hyacinth Meeker spoke with cardiology and they stated that we can cardiovert and if successful can discharge and if not may admit.  I spoke to the patient about the risk and benefit of cardioversion and patient with full decision making capacity agreed with cardioversion.  Patient was cardioverted successfully and monitored for his inappropriate my time and has remained in sinus and states that she wants to go home follow-up with cardiology which is reasonable.  Will discharge with cardiology follow-up.  Encourage patient continue take your medications as prescribed.  Patient given return precautions.  Patient verbalized understanding and acceptance of this plan.  Patient safe to be discharged.  .Cardioversion  Date/Time: 05/31/2023 5:27 PM  Performed by: Netta Corrigan, PA-C Authorized by: Netta Corrigan, PA-C   Consent:    Consent obtained:  Verbal   Consent given by:  Patient   Risks discussed:  Cutaneous burn, death, induced arrhythmia and pain   Alternatives discussed:  No treatment Pre-procedure details:    Cardioversion basis:  Emergent   Rhythm:  Atrial fibrillation   Electrode placement:  Anterior-posterior Patient sedated: Yes. Refer to sedation procedure documentation for details of sedation.  Attempt one:    Cardioversion mode:  Synchronous   Waveform:  Biphasic   Shock (Joules):  200   Shock outcome:  Conversion to normal sinus rhythm Post-procedure details:    Patient status:  Awake   Patient tolerance of procedure:  Tolerated well, no immediate complications      Remi Deter 05/31/23 1729    Charlynne Pander, MD 05/31/23 2022

## 2023-05-31 NOTE — ED Provider Notes (Signed)
[place] EMERGENCY DEPARTMENT AT [facility] Provider Note   CSN: [id number] Arrival date & time: 05/31/23  1139     History  Chief Complaint  Patient presents with   Atrial Fibrillation    Leslie [name] is a 78 y.o. female.   Atrial Fibrillation Associated symptoms include shortness of breath.  Patient is a 78 year old female presents the ED today complaining of irregular heartbeat accompanied with shortness of breath exertion that started last night and has been sustained throughout the night.  Contacted cardiology today spoke with Barrett PA-C who said that she should have, and due to her having irregular heartbeat and having recently undergone a whole blood transfusion yesterday.  Lives alone.  Endorses mild cough and mild congestion.  Takes Allegra and Flonase and has taken them for extended period.  Denies headache, fever, vision changes, abdominal pain, nausea, vomiting, diarrhea.     Home Medications Prior to Admission medications   Medication Sig Start Date End Date Taking? Authorizing Provider  acetaminophen (TYLENOL) 650 MG CR tablet Take 1,300 mg by mouth at bedtime as needed for pain.    [provider]  ALPRAZolam Prudy Feeler) 0.25 MG tablet Take 1 tablet (0.25 mg total) by mouth as needed for anxiety (take 1 to 2 tablets for each of your upcoming flights). 03/05/23   Croitoru, Mihai, MD  amiodarone (PACERONE) 200 MG tablet Take 1 tablet (200 mg total) by mouth daily. 02/13/23   Joylene Grapes, NP  amLODipine (NORVASC) 5 MG tablet Take 1 tablet (5 mg total) by mouth daily. 04/02/23   Croitoru, Mihai, MD  apixaban (ELIQUIS) 5 MG TABS tablet Take 1 tablet (5 mg total) by mouth 2 (two) times daily. 05/13/23   Croitoru, Mihai, MD  ARNUITY ELLIPTA 50 MCG/ACT AEPB Inhale 1 puff into the lungs at bedtime. 04/26/22   [provider]  atorvastatin (LIPITOR) 40 MG tablet Take 1 tablet (40 mg total) by mouth daily. 04/18/22   Joylene Grapes, NP   atorvastatin (LIPITOR) 40 MG tablet Take 1 tablet (40 mg total) by mouth in the morning. 05/27/23     Bacillus Coagulans-Inulin (ALIGN PREBIOTIC-PROBIOTIC PO) Taking 2 Gummies by mouth daily    [provider]  carvedilol (COREG) 12.5 MG tablet Take 2 tablets (25 mg total) by mouth every morning AND 1 tablet (12.5 mg total) at bedtime. 04/02/23   Croitoru, Mihai, MD  cholecalciferol (VITAMIN D) 1000 UNITS tablet Take 1,000 Units by mouth daily.    [provider]  diclofenac Sodium (VOLTAREN) 1 % GEL Apply 1 application topically 2 (two) times daily.    [provider]  fexofenadine (ALLEGRA) 180 MG tablet Take 180 mg by mouth daily.    [provider]  fluticasone (FLONASE) 50 MCG/ACT nasal spray Place 2 sprays into both nostrils at bedtime.     [provider]  Fluticasone Furoate (ARNUITY ELLIPTA) 50 MCG/ACT AEPB Inhale 1 puff by mouth daily. 03/27/23     furosemide (LASIX) 20 MG tablet Take 20 mg daily. May take an additional 20 mg on the days of increase swelling, weight gain of 3 lb overnight or 5 lb in 1 week. 01/22/23   Joylene Grapes, NP  Glucosamine-Chondroitin (OSTEO BI-FLEX REGULAR STRENGTH PO) Take 1 tablet by mouth daily.    [provider]  levothyroxine (SYNTHROID) 75 MCG tablet Take 75 mcg by mouth every morning. 02/26/23   [provider]  levothyroxine (SYNTHROID, LEVOTHROID) 50 MCG tablet Take 50 mcg by  mouth daily before breakfast. Patient not taking: Reported on 05/21/2023    [provider]  magnesium oxide (MAG-OX) 400 (240 Mg) MG tablet Take 1 tablet (400 mg total) by mouth daily. 06/03/22   Croitoru, Mihai, MD  metroNIDAZOLE (METROCREAM) 0.75 % cream Apply 1 application  topically daily. 05/16/20   [provider]  mometasone (ELOCON) 0.1 % cream Apply 1 application  topically 2 (two) times daily as needed (skin irritation). 05/18/20   [provider]  Multiple Vitamins-Minerals (CENTRUM  MULTIGUMMIES) CHEW Takes 1 gummy by mouth daily    [provider]  omeprazole (PRILOSEC) 20 MG capsule Take 40 mg by mouth daily before breakfast. 07/27/16   [provider]  predniSONE (DELTASONE) 20 MG tablet Take 20 mg a day for 3 days 03/05/23   Croitoru, Mihai, MD      Allergies    Clindamycin hcl, Augmentin [amoxicillin-pot clavulanate], Codeine, Demerol [meperidine], and Sulfa antibiotics    Review of Systems   Review of Systems  Respiratory:  Positive for shortness of breath.   Cardiovascular:        Chest pressure  All other systems reviewed and are negative.   Physical Exam Updated Vital Signs BP 130/74   Pulse 97   Resp 17   SpO2 96%  Physical Exam Vitals and nursing note reviewed.  Constitutional:      Appearance: Normal appearance.  HENT:     Head: Normocephalic and atraumatic.  Eyes:     Extraocular Movements: Extraocular movements intact.     Conjunctiva/sclera: Conjunctivae normal.  Cardiovascular:     Rate and Rhythm: Normal rate. Rhythm irregular.     Pulses: Normal pulses.     Heart sounds: Murmur heard.  Pulmonary:     Effort: Pulmonary effort is normal. No respiratory distress.     Breath sounds: Normal breath sounds.  Abdominal:     General: Abdomen is flat.     Palpations: Abdomen is soft.     Tenderness: There is no abdominal tenderness.  Skin:    General: Skin is warm and dry.  Neurological:     General: No focal deficit present.     Mental Status: She is alert. Mental status is at baseline.  Psychiatric:        Mood and Affect: Mood normal.     ED Results / Procedures / Treatments   Labs (all labs ordered are listed, but only abnormal results are displayed) Labs Reviewed  COMPREHENSIVE METABOLIC PANEL - Abnormal; Notable for the following components:      Result Value   Creatinine, Ser 1.36 (*)    Calcium 8.5 (*)    Total Protein 6.4 (*)    Albumin 3.4 (*)    GFR, Estimated 40 (*)    All other components within  normal limits  CBC WITH DIFFERENTIAL/PLATELET - Abnormal; Notable for the following components:   RBC 3.77 (*)    Hemoglobin 8.0 (*)    HCT 27.3 (*)    MCV 72.4 (*)    MCH 21.2 (*)    MCHC 29.3 (*)    RDW 20.0 (*)    All other components within normal limits  TROPONIN I (HIGH SENSITIVITY)    EKG EKG Interpretation Date/Time:  Saturday May 31 2023 13:56:06 EST Ventricular Rate:  83 PR Interval:    QRS Duration:  98 QT Interval:  397 QTC Calculation: 467 R Axis:   107  Text Interpretation: Atrial fibrillation Right axis deviation Low voltage, precordial leads  Borderline T abnormalities, anterior leads Confirmed by Eber Hong ([id number]) on 05/31/2023 2:58:06 PM  Radiology DG Chest Port 1 View Result Date: 05/31/2023 CLINICAL DATA:  Chest pain EXAM: PORTABLE CHEST 1 VIEW COMPARISON:  Chest radiograph dated 12/31/2022. FINDINGS: The heart is enlarged. Vascular calcifications are seen in the aortic arch. There is mild bibasilar atelectasis/airspace disease. No significant pleural effusion or pneumothorax. Degenerative changes are seen in the spine. IMPRESSION: Mild bibasilar atelectasis/airspace disease. Electronically Signed   By: Romona Curls M.D.   On: 05/31/2023 12:56    Procedures Procedures   Medications Ordered in ED Medications  metoprolol tartrate (LOPRESSOR) injection 5 mg (5 mg Intravenous Given 05/31/23 1521)    ED Course/ Medical Decision Making/ A&P                                 Medical Decision Making Amount and/or Complexity of Data Reviewed Labs: ordered. Radiology: ordered.   This patient is a 78 year old female who presents to the ED for concern of sustained A-fib.   Differential diagnoses prior to evaluation: The emergent differential diagnosis includes, but is not limited to, A-fib, a flutter, heart failure, ACS, PE. This is not an exhaustive differential.   Past Medical History / Co-morbidities / Social History: HTN, hypothyroidism, CKD,  paroxysmal A-fib, anticoagulated on Eliquis, mitral valve insufficiency, aortic valve regurgitation, CHF, asthma, anemia  Additional history: Chart reviewed. Pertinent results include:   Currently undergoing iron infusions due to inability to tolerate oral supplements with last iron infusion done on 05/30/2023.  Lab Tests/Imaging studies: I personally interpreted labs/imaging and the pertinent results include:   CBC was at patient's baseline Troponin pending CMP pending  Chest x-ray shows mild bibasilar atelectasis. I agree with the radiologist interpretation.  Cardiac monitoring: EKG obtained and interpreted by myself and attending physician which shows: AF   EKG Interpretation Date/Time:  Saturday May 31 2023 13:56:06 EST Ventricular Rate:  83 PR Interval:    QRS Duration:  98 QT Interval:  397 QTC Calculation: 467 R Axis:   107  Text Interpretation: Atrial fibrillation Right axis deviation Low voltage, precordial leads Borderline T abnormalities, anterior leads Confirmed by Eber Hong ([id number]) on 05/31/2023 2:58:06 PM          Medications: I ordered medication including metoprolol 5 mg.  I have reviewed the patients home medicines and have made adjustments as needed.  ED Course:  Patient is a 78 year old female presents ED complaining of continued A-fib that started last night and was accompanied with shortness of breath on exertion.  Previous medical history of A-fib needing cardioversion, currently anticoagulated on Eliquis, CHF.  Had recently undergone a whole blood infusion yesterday for anemia where she normally undergoes iron infusions.  On physical exam, patient was noted to have an irregular heartbeat.  As well as bilateral lower extremity pitting edema 2+ bilaterally.  Neuroexam was unremarkable and baseline for patient.  Patient was ambulated on monitor and shown to have dyspnea as well as a heart rate of around 120 when ambulating.  Chest x-ray showed  mild bibasilar atelectasis.  Troponin pending, CMP pending.  CBC shows anemia with a hemoglobin of 8.0 which has risen from yesterday getting having received 1 unit of blood.   Talked to cardiology, and they said that they wanted to cardiovert. Pt care was then transferred at that time.   Disposition: 3:38 PM Care of [facility] transferred to [facility]  and Dr. Silverio Lay at the end of my shift as the patient will require reassessment once labs/imaging have resulted. Patient presentation, ED course, and plan of care discussed with review of all pertinent labs and imaging. Please see his/her note for further details regarding further ED course and disposition.   Plan at time of handoff is cardiovert and if stable can go home and  if not able to cardiovert, admit and treat for symptomatic anemia. This may be altered or completely changed at the discretion of the oncoming team pending results of further workup.   Final Clinical Impression(s) / ED Diagnoses Final diagnoses:  Paroxysmal atrial fibrillation [facility])    Rx / DC Orders ED Discharge Orders     None         Lavonia Drafts 05/31/23 1538    Eber Hong, MD 06/01/23 0700

## 2023-06-01 LAB — BPAM RBC
Blood Product Expiration Date: 202503122359
ISSUE DATE / TIME: 202502140952
Unit Type and Rh: 9500

## 2023-06-01 LAB — TYPE AND SCREEN
ABO/RH(D): O NEG
Antibody Screen: NEGATIVE
Unit division: 0

## 2023-06-02 ENCOUNTER — Other Ambulatory Visit: Payer: Self-pay | Admitting: Physician Assistant

## 2023-06-02 ENCOUNTER — Telehealth: Payer: Self-pay

## 2023-06-02 NOTE — Telephone Encounter (Signed)
Patient called in and wanted to change her infusion appt on Thursday because of the weather. Patient is scheduled for Saturday 2/22 @ 930.  Patient verbalized thanks and understanding.

## 2023-06-02 NOTE — Telephone Encounter (Signed)
Patient called and stated that she had spoken with staff at [facility] and was able to reschedule her Thursday infusion appointment to a Saturday appointment at [facility] due to the weather forecast. Per patient, Thursday 2/20 infusion appointment at [facility] cancelled. Patient stated that she spoke with Dr. Asa Lente office, and the provider directed her to keep her upcoming infusion appointments at [facility] on 06/12/23 and 06/19/23.  Wyvonne Lenz, RN

## 2023-06-05 ENCOUNTER — Ambulatory Visit: Payer: PPO

## 2023-06-06 MED FILL — Iron Sucrose Inj 20 MG/ML (Fe Equiv): INTRAVENOUS | Qty: 15 | Status: AC

## 2023-06-07 ENCOUNTER — Inpatient Hospital Stay: Payer: PPO

## 2023-06-07 VITALS — BP 101/48 | HR 51 | Temp 97.6°F | Resp 18

## 2023-06-07 DIAGNOSIS — D509 Iron deficiency anemia, unspecified: Secondary | ICD-10-CM

## 2023-06-07 MED ORDER — DIPHENHYDRAMINE HCL 25 MG PO CAPS
50.0000 mg | ORAL_CAPSULE | Freq: Once | ORAL | Status: AC
Start: 2023-06-07 — End: 2023-06-07
  Administered 2023-06-07: 50 mg via ORAL
  Filled 2023-06-07: qty 2

## 2023-06-07 MED ORDER — ACETAMINOPHEN 325 MG PO TABS
650.0000 mg | ORAL_TABLET | Freq: Once | ORAL | Status: AC
  Administered 2023-06-07: 650 mg via ORAL
  Filled 2023-06-07: qty 2

## 2023-06-07 MED ORDER — SODIUM CHLORIDE 0.9 % IV SOLN: INTRAVENOUS | Status: DC

## 2023-06-07 MED ORDER — SODIUM CHLORIDE 0.9 % IV SOLN
300.0000 mg | Freq: Once | INTRAVENOUS | Status: AC
  Administered 2023-06-07: 300 mg via INTRAVENOUS
  Filled 2023-06-07: qty 300

## 2023-06-07 NOTE — Patient Instructions (Signed)

## 2023-06-10 ENCOUNTER — Telehealth: Payer: Self-pay

## 2023-06-10 NOTE — Telephone Encounter (Signed)
 Open error

## 2023-06-10 NOTE — Telephone Encounter (Signed)
 See below correspondence. Scheduled the patient for Watchman consult with Dr. Lalla Brothers on 07/18/2023. She will call when her appointment with Dr. Marca Ancona is scheduled as an update. She understands her Watchman consult will be most meaningful if her appointment with Dr. Marca Ancona [place] completed first.  She was grateful for call and agreed with plan.      Croitoru, Rachelle Hora, MD  Scheryl Marten, RN; Tobin Chad, RN; Henrietta Dine, RN Can we please refer to EP, either Dr. Lalla Brothers or Dr. Jimmey Ralph for watchman? Thank you       Previous Messages    ----- Message ----- From: Shon Hale, MD Sent: 06/10/2023   4:04 PM EST To: Thurmon Fair, MD; Kerin Salen, MD Subject: coordination of care- anemia                  Hey, Looping y'all in on a few questions for Ms [name]. She's anemic again - nadir 7.0. Heme got her 1UPRBC and she is getting iron infusions x 3 over the next month. She is feeling SOB and palpitations today still at my visit, so I'm checking to make sure she didn't drop further.  Dr Kirtland Bouchard - does she need capsule endoscopy or other w/u for a GI bleeding cause? EGD and colon didn't show a cause as of 10/2022.  Dr C- does she need another long term plan re her afib or NOAC if she continues to bleed? I defer to you obviously but maybe ablation or watchman or something? especially if we can't find out where she is bleeding from?  happy to chat more anytime. Jae Dire

## 2023-06-10 NOTE — Telephone Encounter (Signed)
 Croitoru, Rachelle Hora, MD  Scheryl Marten, RN; Tobin Chad, RN; Henrietta Dine, RN Can we please refer to EP, either Dr. Lalla Brothers or Dr. Jimmey Ralph for watchman? Thank you       Previous Messages    ----- Message ----- From: Shon Hale, MD Sent: 06/10/2023   4:04 PM EST To: Thurmon Fair, MD; Kerin Salen, MD Subject: coordination of care- anemia                  Hey, Looping y'all in on a few questions for Ms [name]. She's anemic again - nadir 7.0. Heme got her 1UPRBC and she is getting iron infusions x 3 over the next month. She is feeling SOB and palpitations today still at my visit, so I'm checking to make sure she didn't drop further.  Dr Kirtland Bouchard - does she need capsule endoscopy or other w/u for a GI bleeding cause? EGD and colon didn't show a cause as of 10/2022.  Dr C- does she need another long term plan re her afib or NOAC if she continues to bleed? I defer to you obviously but maybe ablation or watchman or something? especially if we can't find out where she is bleeding from?  happy to chat more anytime. Jae Dire

## 2023-06-10 NOTE — Telephone Encounter (Signed)
 Order placed to EP for watchman

## 2023-06-12 ENCOUNTER — Ambulatory Visit (INDEPENDENT_AMBULATORY_CARE_PROVIDER_SITE_OTHER): Payer: PPO

## 2023-06-12 VITALS — BP 109/70 | HR 53 | Temp 97.4°F | Resp 16 | Ht 65.0 in | Wt 240.8 lb

## 2023-06-12 DIAGNOSIS — D509 Iron deficiency anemia, unspecified: Secondary | ICD-10-CM | POA: Diagnosis not present

## 2023-06-12 DIAGNOSIS — D500 Iron deficiency anemia secondary to blood loss (chronic): Secondary | ICD-10-CM

## 2023-06-12 DIAGNOSIS — K625 Hemorrhage of anus and rectum: Secondary | ICD-10-CM | POA: Diagnosis not present

## 2023-06-12 MED ORDER — IRON SUCROSE 300 MG IVPB - SIMPLE MED: 300.0000 mg | Freq: Once | Status: DC

## 2023-06-12 MED ORDER — ALTEPLASE 2 MG IJ SOLR: 2.0000 mg | Freq: Once | INTRAMUSCULAR | Status: DC | PRN

## 2023-06-12 MED ORDER — DIPHENHYDRAMINE HCL 25 MG PO CAPS
25.0000 mg | ORAL_CAPSULE | Freq: Once | ORAL | Status: AC
  Administered 2023-06-12: 25 mg via ORAL
  Filled 2023-06-12: qty 1

## 2023-06-12 MED ORDER — SODIUM CHLORIDE 0.9 % IV SOLN: Freq: Once | INTRAVENOUS | Status: DC | PRN

## 2023-06-12 MED ORDER — HEPARIN SOD (PORK) LOCK FLUSH 100 UNIT/ML IV SOLN
500.0000 [IU] | Freq: Once | INTRAVENOUS | Status: DC | PRN
Start: 2023-06-12 — End: 2023-06-12

## 2023-06-12 MED ORDER — DIPHENHYDRAMINE HCL 50 MG/ML IJ SOLN: 50.0000 mg | Freq: Once | INTRAMUSCULAR | Status: DC | PRN

## 2023-06-12 MED ORDER — HEPARIN SOD (PORK) LOCK FLUSH 100 UNIT/ML IV SOLN: 250.0000 [IU] | Freq: Once | INTRAVENOUS | Status: DC | PRN

## 2023-06-12 MED ORDER — EPINEPHRINE 0.3 MG/0.3ML IJ SOAJ: 0.3000 mg | Freq: Once | INTRAMUSCULAR | Status: DC | PRN

## 2023-06-12 MED ORDER — SODIUM CHLORIDE 0.9 % IV SOLN
300.0000 mg | Freq: Once | INTRAVENOUS | Status: AC
  Administered 2023-06-12: 300 mg via INTRAVENOUS
  Filled 2023-06-12: qty 15

## 2023-06-12 MED ORDER — SODIUM CHLORIDE 0.9% FLUSH: 10.0000 mL | Freq: Once | INTRAVENOUS | Status: DC | PRN

## 2023-06-12 MED ORDER — ALBUTEROL SULFATE HFA 108 (90 BASE) MCG/ACT IN AERS: 2.0000 | INHALATION_SPRAY | Freq: Once | RESPIRATORY_TRACT | Status: DC | PRN

## 2023-06-12 MED ORDER — SODIUM CHLORIDE 0.9% FLUSH: 3.0000 mL | Freq: Once | INTRAVENOUS | Status: DC | PRN

## 2023-06-12 MED ORDER — ANTICOAGULANT SODIUM CITRATE 4% (200MG/5ML) IV SOLN: 5.0000 mL | Freq: Once | Status: DC | PRN

## 2023-06-12 MED ORDER — METHYLPREDNISOLONE SODIUM SUCC 125 MG IJ SOLR: 125.0000 mg | Freq: Once | INTRAMUSCULAR | Status: DC | PRN

## 2023-06-12 MED ORDER — FAMOTIDINE IN NACL 20-0.9 MG/50ML-% IV SOLN
20.0000 mg | Freq: Once | INTRAVENOUS | Status: DC | PRN
Start: 2023-06-12 — End: 2023-06-12

## 2023-06-12 MED ORDER — ACETAMINOPHEN 325 MG PO TABS
650.0000 mg | ORAL_TABLET | Freq: Once | ORAL | Status: AC
  Administered 2023-06-12: 650 mg via ORAL
  Filled 2023-06-12: qty 2

## 2023-06-12 NOTE — Progress Notes (Signed)
 Diagnosis: Iron Deficiency Anemia  Provider:  Chilton Greathouse MD  Procedure: IV Infusion  IV Type: Peripheral, IV Location: L Antecubital  Venofer (Iron Sucrose), Dose: 300 mg  Infusion Start Time: 1052  Infusion Stop Time: 1233  Post Infusion IV Care: Patient declined observation and Peripheral IV Discontinued  Discharge: Condition: Good, Destination: Home . AVS Declined  Performed by:  Rico Ala, LPN

## 2023-06-18 ENCOUNTER — Other Ambulatory Visit (HOSPITAL_BASED_OUTPATIENT_CLINIC_OR_DEPARTMENT_OTHER): Payer: Self-pay

## 2023-06-18 ENCOUNTER — Other Ambulatory Visit: Payer: Self-pay | Admitting: Gastroenterology

## 2023-06-18 MED ORDER — LEVOTHYROXINE SODIUM 75 MCG PO TABS
75.0000 ug | ORAL_TABLET | ORAL | 0 refills | Status: DC
  Filled 2023-06-18: qty 90, 90d supply, fill #0

## 2023-06-19 ENCOUNTER — Telehealth: Payer: Self-pay

## 2023-06-19 ENCOUNTER — Ambulatory Visit: Payer: PPO

## 2023-06-19 VITALS — BP 115/70 | HR 55 | Temp 97.7°F | Resp 18 | Ht 65.0 in | Wt 238.6 lb

## 2023-06-19 DIAGNOSIS — D500 Iron deficiency anemia secondary to blood loss (chronic): Secondary | ICD-10-CM | POA: Diagnosis not present

## 2023-06-19 DIAGNOSIS — K625 Hemorrhage of anus and rectum: Secondary | ICD-10-CM

## 2023-06-19 MED ORDER — IRON SUCROSE 20 MG/ML IV SOLN
300.0000 mg | Freq: Once | INTRAVENOUS | Status: AC
  Administered 2023-06-19: 300 mg via INTRAVENOUS
  Filled 2023-06-19: qty 15

## 2023-06-19 MED ORDER — ACETAMINOPHEN 325 MG PO TABS: 650.0000 mg | ORAL_TABLET | Freq: Once | ORAL | Status: DC

## 2023-06-19 MED ORDER — DIPHENHYDRAMINE HCL 25 MG PO CAPS: 25.0000 mg | ORAL_CAPSULE | Freq: Once | ORAL | Status: DC

## 2023-06-19 MED ORDER — IRON SUCROSE 300 MG IVPB - SIMPLE MED: 300.0000 mg | Freq: Once | Status: DC

## 2023-06-19 NOTE — Telephone Encounter (Signed)
 Emmit Alexanders,  We have received a surgical clearance request for Ms. Pusch. They were seen recently in clinic on 05/21/2023. Can you please comment on surgical clearance for her upcoming EGD procedure. Please forward you guidance and recommendations to P [facility] PREOP   Thanks, Robin Searing, NP

## 2023-06-19 NOTE — Telephone Encounter (Signed)
 Pharmacy please advise on holding Eliquis prior to endoscopy scheduled for 07/08/2023. Thank you.

## 2023-06-19 NOTE — Telephone Encounter (Signed)
   Pre-operative Risk Assessment    Patient Name: Leslie [name]  DOB: 1945/09/21 MRN: [id number]   Date of last office visit: 05/21/23 Date of next office visit: 06/23/23   Request for Surgical Clearance    Procedure:   Endoscopy  Date of Surgery:  Clearance 07/08/23                                Surgeon:  Dr. Kerin Salen Surgeon's Group or Practice Name:  [facility] Phone number:  [phone] Fax number:  [phone]   Type of Clearance Requested:   - Medical  - Pharmacy:  Hold Apixaban (Eliquis)     Type of Anesthesia:  Not Indicated   Additional requests/questions:    Garrel Ridgel   06/19/2023, 2:44 PM

## 2023-06-19 NOTE — Progress Notes (Signed)
 Diagnosis: Iron Deficiency Anemia  Provider:  Chilton Greathouse MD  Procedure: IV Infusion  IV Type: Peripheral, IV Location: R Forearm  Venofer (Iron Sucrose), Dose: 300 mg  Infusion Start Time: 1144  Infusion Stop Time: 1332  Post Infusion IV Care: Patient declined observation and Peripheral IV Discontinued  Discharge: Condition: Good, Destination: Home . AVS Declined  Performed by:  Adriana Mccallum, RN

## 2023-06-20 ENCOUNTER — Encounter (HOSPITAL_BASED_OUTPATIENT_CLINIC_OR_DEPARTMENT_OTHER): Payer: Self-pay

## 2023-06-20 NOTE — Telephone Encounter (Signed)
 Patient with diagnosis of afib on Eliquis for anticoagulation.    Procedure: Endoscopy  Date of procedure: 07/08/23   CHA2DS2-VASc Score = 5   This indicates a 7.2% annual risk of stroke. The patient's score is based upon: CHF History: 1 HTN History: 1 Diabetes History: 0 Stroke History: 0 Vascular Disease History: 0 Age Score: 2 Gender Score: 1      CrCl 42 ml/min Platelet count 247  Per office protocol, patient can hold Eliquis for 2 days prior to procedure.    **This guidance is not considered finalized until pre-operative APP has relayed final recommendations.**

## 2023-06-20 NOTE — Telephone Encounter (Signed)
   Name: Leslie [name]  DOB: 1945-09-26  MRN: [id number]  Primary Cardiologist: Thurmon Fair, MD  Chart reviewed as part of pre-operative protocol coverage. Because of Leslie [name] past medical history and time since last visit, she will require a follow-up in-office visit in order to better assess preoperative cardiovascular risk.  Pre-op covering staff: - Please schedule appointment and call patient to inform them. If patient already had an upcoming appointment within acceptable timeframe, please add "pre-op clearance" to the appointment notes so provider is aware. - Please contact requesting surgeon's office via preferred method (i.e, phone, fax) to inform them of need for appointment prior to surgery.   Napoleon Form, Leodis Rains, NP  06/20/2023, 7:01 AM

## 2023-06-20 NOTE — Telephone Encounter (Signed)
 Pt agreeable to see Edd Fabian, FNP 06/23/23 @ 1:55, then pt has her echo at [facility] same day @ 3 pm. Pt thanked me so much for all of the help.

## 2023-06-22 NOTE — Progress Notes (Unsigned)
 Cardiology Clinic Note   Patient Name: Leslie [name] Date of Encounter: 06/22/2023  Primary Care Provider:  Shon Hale, MD Primary Cardiologist:  Thurmon Fair, MD  Patient Profile    Leslie [name] 78 year old female presents to the clinic today for follow-up evaluation of her essential hypertension, atrial fibrillation, and preoperative cardiac evaluation.  Past Medical History    Past Medical History:  Diagnosis Date   Adenomatous polyp    Arthritis    "knees, legs, fingers" (07/30/2016)   Asthma    Bursitis of left shoulder    "just finished PT" (07/30/2016)   Chest pain    a. 2003 Abnl stress test-->Cath: nonobs CAD.   CHF (congestive heart failure) (HCC)    Chronic bronchitis (HCC)    Chronic kidney disease    stage 3   GERD (gastroesophageal reflux disease)    barrett's esophagus- Dr. Matthias Hughs   History of hiatal hernia    Hyperlipidemia    Hypertension    Hypothyroidism    Migraines    "sporatic; at least a few/year" (07/30/2016)   Mitral regurgitation    a. 07/2016 Echo: mild to mod MR;  b. 07/2016 TEE: mild MR.   Moderate aortic insufficiency    a. 07/2016 Echo: EF 55-60%, mod AI;  b. 07/2016 TEE: EF 50-55%, mod AI.   Obesity    s/p lap band surgery 6/10   OSA on CPAP    PAF (paroxysmal atrial fibrillation) (HCC)    a. 07/2016 TEE/DCCV: EF 50-55%, mild MR, mod AI, mild to mod TR, neg bubble study-->successful DCCV x1 w/ 120J.   PONV (postoperative nausea and vomiting)    Vitamin D deficiency    Past Surgical History:  Procedure Laterality Date   APPENDECTOMY     incidental   CARDIOVERSION N/A 08/01/2016   Procedure: CARDIOVERSION;  Surgeon: Jake Bathe, MD;  Location: [facility] ENDOSCOPY;  Service: Cardiovascular;  Laterality: N/A;   CARDIOVERSION N/A 06/24/2019   Procedure: CARDIOVERSION;  Surgeon: Thurmon Fair, MD;  Location: [facility] ENDOSCOPY;  Service: Cardiovascular;  Laterality: N/A;   CARDIOVERSION N/A 05/30/2020   Procedure: CARDIOVERSION;   Surgeon: Thurmon Fair, MD;  Location: [facility] ENDOSCOPY;  Service: Cardiovascular;  Laterality: N/A;   CARDIOVERSION N/A 05/09/2020   Procedure: CARDIOVERSION;  Surgeon: Little Ishikawa, MD;  Location: [facility] ENDOSCOPY;  Service: Cardiovascular;  Laterality: N/A;   CATARACT EXTRACTION W/ INTRAOCULAR LENS  IMPLANT, BILATERAL Bilateral ~ 2008-2017   left - right   COMBINED HYSTERECTOMY VAGINAL / OOPHORECTOMY / A&P REPAIR  1981   HERNIA REPAIR     INGUINAL HERNIA REPAIR Left 1952   LAPAROSCOPIC GASTRIC BANDING WITH HIATAL HERNIA REPAIR  09/2008   RIGHT/LEFT HEART CATH AND CORONARY ANGIOGRAPHY N/A 04/11/2022   Procedure: RIGHT/LEFT HEART CATH AND CORONARY ANGIOGRAPHY;  Surgeon: Corky Crafts, MD;  Location: [facility] INVASIVE CV LAB;  Service: Cardiovascular;  Laterality: N/A;   TEE WITHOUT CARDIOVERSION N/A 08/01/2016   Procedure: TRANSESOPHAGEAL ECHOCARDIOGRAM (TEE);  Surgeon: Jake Bathe, MD;  Location: [facility] ENDOSCOPY;  Service: Cardiovascular;  Laterality: N/A;   TONSILLECTOMY AND ADENOIDECTOMY  1974   VAGINAL HYSTERECTOMY  1981   "w/1 ovary"    Allergies  Allergies  Allergen Reactions   Clindamycin Hcl     Lip and face swelling   Augmentin [Amoxicillin-Pot Clavulanate] Diarrhea    Did it involve swelling of the face/tongue/throat, SOB, or low BP? No Did it involve sudden or severe rash/hives, skin peeling, or any reaction on the  inside of your mouth or nose? No Did you need to seek medical attention at a hospital or doctor's office? No When did it last happen?      10 + years If all above answers are "NO", may proceed with cephalosporin use.    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  She was initially diagnosed with atrial fibrillation in 2018.  She underwent TEE/DCCV and had repeat DCCV 3/21.  She had subsequent DCCV 2022.  She was started on flecainide.  She developed pulmonary edema and required hospitalization.  She underwent nuclear stress testing 2/22 which showed no evidence of ischemia.  Echocardiogram at that time showed an LVEF of 55-60%, mild MVR, moderate aortic valve regurgitation, dilated left atria.  She was seen in the emergency department 04/09/2022 with shortness of breath, hypertensive urgency, and systolic blood pressures were noted to be greater than 200.  Echocardiogram showed an LVEF of 25-30% with severely decreased LV function.  Cardiology was consulted and she underwent right and left heart cath.  She was diagnosed with mild pulmonary hypertension and nonobstructive CAD as well as ICM.  Her flecainide and verapamil were transition to amiodarone and metoprolol in the setting of reduced EF.  She was not able to tolerate Entresto initially due to hypotension.  This was later resumed.  It was felt that her acute systolic CHF was secondary to Takotsubo cardiomyopathy versus stress-induced cardiomyopathy versus type II MI in the setting of hypertensive urgency.  A follow-up echocardiogram showed recovered EF 65-70%, normal RV function, G2 DD, moderate mitral valve regurgitation and moderate aortic valve regurgitation.  She was hospitalized for 3 days in September 2024.  This was in the setting of acute hypoxic respiratory failure secondary to acute on chronic diastolic CHF.  She received IV diuresis.  Her telemetry showed no episodes of atrial fibrillation and 179 episodes of SVT.  Her carvedilol was increased at that time.  She followed up in the cardiology office 11/24.  She was stable from a cardiac standpoint.  She noted ongoing intermittent palpitations.  Her carvedilol was  increased to 25 mg in the morning and she was continued on 12 and half milligrams in the afternoon.  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  She contacted the cardiology office 04/24/2023 with concerns of chest pain.  She was advised to present to the emergency department.  She declined.  She was seen and evaluated in the clinic and her symptoms were felt to be related to muscle strain in her back.  She was seen in follow-up by Bernadene Person NP on 06/09/2022.  During that time she presented with her daughter on the phone.  She remained stable from a cardiac standpoint.  She did note generalized fatigue and activity intolerance.  She noted shortness of breath with activity and noted that she felt overall yucky.  She did note ongoing intermittent palpitations.  Her heart rate was elevated to low 100s with activity.  She described palpitations as awareness of her heart beating.  She denied any significant  chest pain.  She did note some back pain under her shoulder blades.  Overall her blood pressure was better controlled with increased carvedilol.  She noted that she had not felt well since her hospitalization 9/24.  She continued to exercise for short periods at a time.  She had lost weight.  She was frustrated and concerned about her ongoing symptoms.  She presents to the clinic today for follow-up evaluation and states***.  *** denies chest pain, shortness of breath, lower extremity edema, fatigue, palpitations, melena, hematuria, hemoptysis, diaphoresis, weakness, presyncope, syncope, orthopnea, and PND.  Essential hypertension-BP today***. Maintain blood pressure log Continue carvedilol Heart healthy low-sodium diet Increase physical activity as tolerated  Paroxysmal atrial fibrillation-EKG today shows***.  Denies bleeding issues.  Reports compliance with apixaban. Avoid triggers caffeine, chocolate, EtOH, dehydration  etc. Continue amiodarone, amlodipine, apixaban  Hyperlipidemia-LDL***. Continue atorvastatin Heart healthy low-sodium high-fiber diet Increase physical activity as tolerated  Stress-induced cardiomyopathy-breathing at baseline today.  Continues to note fatigue.  Chronic.  Stable.  Echocardiogram 05/22/2022 showed LVEF of 65-70%, G2 DD, severely dilated left atria, moderate aortic valve regurgitation, moderate mitral valve regurgitation. Heart healthy low-sodium diet Daily weights Elevate lower extremities when not active Continue furosemide, amlodipine, carvedilol   Preoperative cardiac evaluation-EGD,Date of Surgery:  Clearance 07/08/23                                  Surgeon:  Dr. Kerin Salen Surgeon's Group or Practice Name:  [facility] Phone number:  [phone] Fax number:  [phone]    Primary Cardiologist: Thurmon Fair, MD  Chart reviewed as part of pre-operative protocol coverage. Given past medical history and time since last visit, based on ACC/AHA guidelines, SAMARIYA ROCKHOLD would be at acceptable risk for the planned procedure without further cardiovascular testing.   Patient was advised that if she develops new symptoms prior to surgery to contact our office to arrange a follow-up appointment.  He verbalized understanding.  CrCl 42 ml/min Platelet count 247   Per office protocol, patient can hold Eliquis for 2 days prior to procedure.   I will route this recommendation to the requesting party via Epic fax function and remove from pre-op pool.    Disposition: Follow-up with Dr. Royann Shivers or Irving Burton NP in 6 months. Home Medications    Prior to Admission medications   Medication Sig Start Date End Date Taking? Authorizing Provider  acetaminophen (TYLENOL) 650 MG CR tablet Take 1,300 mg by mouth at bedtime as needed for pain.    [provider]  ALPRAZolam Prudy Feeler) 0.25 MG tablet Take 1 tablet (0.25 mg total) by mouth as needed for anxiety  (take 1 to 2 tablets for each of your upcoming flights). 03/05/23   Croitoru, Mihai, MD  amiodarone (PACERONE) 200 MG tablet Take 1 tablet (200 mg total) by mouth daily. 02/13/23   Joylene Grapes, NP  amLODipine (NORVASC) 5 MG tablet Take 1 tablet (5 mg total) by mouth daily. 04/02/23   Croitoru, Mihai, MD  apixaban (ELIQUIS) 5 MG TABS tablet Take 1 tablet (5 mg total) by mouth 2 (two) times daily. 05/13/23   Croitoru, Mihai, MD  ARNUITY ELLIPTA 50 MCG/ACT AEPB Inhale 1 puff into the lungs at bedtime. 04/26/22   [provider]  atorvastatin (LIPITOR) 40 MG tablet Take 1 tablet (40 mg total) by mouth daily. 04/18/22   Joylene Grapes, NP  atorvastatin (LIPITOR) 40 MG tablet Take  1 tablet (40 mg total) by mouth in the morning. 05/27/23     Bacillus Coagulans-Inulin (ALIGN PREBIOTIC-PROBIOTIC PO) Taking 2 Gummies by mouth daily    [provider]  carvedilol (COREG) 12.5 MG tablet Take 2 tablets (25 mg total) by mouth every morning AND 1 tablet (12.5 mg total) at bedtime. 04/02/23   Croitoru, Mihai, MD  cholecalciferol (VITAMIN D) 1000 UNITS tablet Take 1,000 Units by mouth daily.    [provider]  diclofenac Sodium (VOLTAREN) 1 % GEL Apply 1 application topically 2 (two) times daily.    [provider]  fexofenadine (ALLEGRA) 180 MG tablet Take 180 mg by mouth daily.    [provider]  fluticasone (FLONASE) 50 MCG/ACT nasal spray Place 2 sprays into both nostrils at bedtime.     [provider]  Fluticasone Furoate (ARNUITY ELLIPTA) 50 MCG/ACT AEPB Inhale 1 puff by mouth daily. 03/27/23     furosemide (LASIX) 20 MG tablet Take 20 mg daily. May take an additional 20 mg on the days of increase swelling, weight gain of 3 lb overnight or 5 lb in 1 week. 01/22/23   Joylene Grapes, NP  Glucosamine-Chondroitin (OSTEO BI-FLEX REGULAR STRENGTH PO) Take 1 tablet by mouth daily.    [provider]  levothyroxine (SYNTHROID) 75 MCG tablet Take 75 mcg by  mouth every morning. 02/26/23   [provider]  levothyroxine (SYNTHROID) 75 MCG tablet Take 1 tablet (75 mcg total) by mouth every morning on an empty stomach. 06/18/23     levothyroxine (SYNTHROID, LEVOTHROID) 50 MCG tablet Take 50 mcg by mouth daily before breakfast. Patient not taking: Reported on 05/21/2023    [provider]  magnesium oxide (MAG-OX) 400 (240 Mg) MG tablet Take 1 tablet (400 mg total) by mouth daily. 06/03/22   Croitoru, Mihai, MD  metroNIDAZOLE (METROCREAM) 0.75 % cream Apply 1 application  topically daily. 05/16/20   [provider]  mometasone (ELOCON) 0.1 % cream Apply 1 application  topically 2 (two) times daily as needed (skin irritation). 05/18/20   [provider]  Multiple Vitamins-Minerals (CENTRUM MULTIGUMMIES) CHEW Takes 1 gummy by mouth daily    [provider]  omeprazole (PRILOSEC) 20 MG capsule Take 40 mg by mouth daily before breakfast. 07/27/16   [provider]  predniSONE (DELTASONE) 20 MG tablet Take 20 mg a day for 3 days 03/05/23   Croitoru, Mihai, MD    Family History    Family History  Problem Relation Age of Onset   Heart failure Mother    Hypertension Mother    Hypertension Father    Emphysema Father    She indicated that her mother is deceased. She indicated that her father is deceased. She indicated that her brother is alive.  Social History    Social History   Socioeconomic History   Marital status: Divorced    Spouse name: Not on file   Number of children: Not on file   Years of education: 16   Highest education level: Not on file  Occupational History   Occupation: Retired  Tobacco Use   Smoking status: Former   Smokeless tobacco: Never   Tobacco comments:    07/30/2016 "social smoker only; in college"  Vaping Use   Vaping status: Never Used  Substance and Sexual Activity   Alcohol use: Yes    Alcohol/week: 2.0 standard drinks of alcohol    Types: 1 Glasses of wine, 1  Standard drinks or equivalent per week  Comment: 07/30/2016 "couple drinks/month"   Drug use: No   Sexual activity: Not Currently  Other Topics Concern   Not on file  Social History Narrative   Not on file   Social Drivers of Health   Financial Resource Strain: Medium Risk (04/12/2022)   Overall Financial Resource Strain (CARDIA)    Difficulty of Paying Living Expenses: Somewhat hard  Food Insecurity: No Food Insecurity (01/02/2023)   Hunger Vital Sign    Worried About Running Out of Food in the Last Year: Never true    Ran Out of Food in the Last Year: Never true  Transportation Needs: No Transportation Needs (01/02/2023)   PRAPARE - Administrator, Civil Service (Medical): No    Lack of Transportation (Non-Medical): No  Physical Activity: Not on file  Stress: Not on file  Social Connections: Not on file  Intimate Partner Violence: Not At Risk (01/02/2023)   Humiliation, Afraid, Rape, and Kick questionnaire    Fear of Current or Ex-Partner: No    Emotionally Abused: No    Physically Abused: No    Sexually Abused: No     Review of Systems    General:  No chills, fever, night sweats or weight changes.  Cardiovascular:  No chest pain, dyspnea on exertion, edema, orthopnea, palpitations, paroxysmal nocturnal dyspnea. Dermatological: No rash, lesions/masses Respiratory: No cough, dyspnea Urologic: No hematuria, dysuria Abdominal:   No nausea, vomiting, diarrhea, bright red blood per rectum, melena, or hematemesis Neurologic:  No visual changes, wkns, changes in mental status. All other systems reviewed and are otherwise negative except as noted above.  Physical Exam    VS:  There were no vitals taken for this visit. , BMI There is no height or weight on file to calculate BMI. GEN: Well nourished, well developed, in no acute distress. HEENT: normal. Neck: Supple, no JVD, carotid bruits, or masses. Cardiac: RRR, no murmurs, rubs, or gallops. No clubbing, cyanosis,  edema.  Radials/DP/PT 2+ and equal bilaterally.  Respiratory:  Respirations regular and unlabored, clear to auscultation bilaterally. GI: Soft, nontender, nondistended, BS + x 4. MS: no deformity or atrophy. Skin: warm and dry, no rash. Neuro:  Strength and sensation are intact. Psych: Normal affect.  Accessory Clinical Findings    Recent Labs: 12/31/2022: B Natriuretic Peptide 833.3 01/03/2023: Magnesium 2.4 05/21/2023: TSH 3.510 05/31/2023: ALT 23; BUN 17; Creatinine, Ser 1.36; Hemoglobin 8.0; Platelets 247; Potassium 3.6; Sodium 138   Recent Lipid Panel    Component Value Date/Time   CHOL 146 07/31/2016 0435   TRIG 174 (H) 07/31/2016 0435   HDL 32 (L) 07/31/2016 0435   CHOLHDL 4.6 07/31/2016 0435   VLDL 35 07/31/2016 0435   LDLCALC 79 07/31/2016 0435    No BP recorded.  {Refresh Note OR Click here to enter BP  :1}***    ECG personally reviewed by me today- ***    Echocardiogram 05/22/2022  IMPRESSIONS     1. Left ventricular ejection fraction, by estimation, is 65 to 70%. The  left ventricle has normal function. The left ventricle has no regional  wall motion abnormalities. Left ventricular diastolic parameters are  consistent with Grade II diastolic  dysfunction (pseudonormalization). Elevated left ventricular end-diastolic  pressure.   2. Right ventricular systolic function is normal. The right ventricular  size is normal. There is normal pulmonary artery systolic pressure.   3. Left atrial size was severely dilated.   4. The mitral valve is normal in structure. Moderate mitral valve  regurgitation.  No evidence of mitral stenosis.   5. The aortic valve is tricuspid. Aortic valve regurgitation is moderate.  No aortic stenosis is present. Aortic regurgitation PHT measures 653 msec.   6. The inferior vena cava is normal in size with greater than 50%  respiratory variability, suggesting right atrial pressure of 3 mmHg.   FINDINGS   Left Ventricle: Left ventricular  ejection fraction, by estimation, is 65  to 70%. The left ventricle has normal function. The left ventricle has no  regional wall motion abnormalities. The left ventricular internal cavity  size was normal in size. There is   no left ventricular hypertrophy. Left ventricular diastolic parameters  are consistent with Grade II diastolic dysfunction (pseudonormalization).  Elevated left ventricular end-diastolic pressure.   Right Ventricle: The right ventricular size is normal. No increase in  right ventricular wall thickness. Right ventricular systolic function is  normal. There is normal pulmonary artery systolic pressure. The tricuspid  regurgitant velocity is 2.49 m/s, and   with an assumed right atrial pressure of 3 mmHg, the estimated right  ventricular systolic pressure is 27.8 mmHg.   Left Atrium: Left atrial size was severely dilated.   Right Atrium: Right atrial size was normal in size.   Pericardium: There is no evidence of pericardial effusion.   Mitral Valve: The mitral valve is normal in structure. Moderate mitral  valve regurgitation. No evidence of mitral valve stenosis.   Tricuspid Valve: The tricuspid valve is normal in structure. Tricuspid  valve regurgitation is mild . No evidence of tricuspid stenosis.   Aortic Valve: The aortic valve is tricuspid. Aortic valve regurgitation is  moderate. Aortic regurgitation PHT measures 653 msec. No aortic stenosis  is present.   Pulmonic Valve: The pulmonic valve was normal in structure. Pulmonic valve  regurgitation is trivial. No evidence of pulmonic stenosis.   Aorta: The aortic root is normal in size and structure.   Venous: The inferior vena cava is normal in size with greater than 50%  respiratory variability, suggesting right atrial pressure of 3 mmHg.   IAS/Shunts: No atrial level shunt detected by color flow Doppler.        Assessment & Plan   1.  ***   Thomasene Ripple. Quanetta Truss NP-C     06/22/2023, 2:38  PM [facility] [address] Office [phone] Fax [phone]    I spent***minutes examining this patient, reviewing medications, and using patient centered shared decision making involving their cardiac care.   I spent  20 minutes reviewing past medical history,  medications, and prior cardiac tests.

## 2023-06-23 ENCOUNTER — Ambulatory Visit: Admitting: General Practice

## 2023-06-23 ENCOUNTER — Telehealth: Payer: Self-pay

## 2023-06-23 ENCOUNTER — Encounter: Payer: Self-pay | Admitting: General Practice

## 2023-06-23 ENCOUNTER — Ambulatory Visit (HOSPITAL_COMMUNITY)
Admission: RE | Admit: 2023-06-23 | Discharge: 2023-06-23 | Disposition: A | Discharge status: Home / Self Care | Payer: PPO | Source: Ambulatory Visit | Attending: Nurse Practitioner | Admitting: Nurse Practitioner

## 2023-06-23 ENCOUNTER — Encounter: Payer: Self-pay | Admitting: Cardiovascular Disease

## 2023-06-23 VITALS — BP 98/60 | HR 55 | Ht 65.0 in | Wt 238.0 lb

## 2023-06-23 DIAGNOSIS — I340 Nonrheumatic mitral (valve) insufficiency: Secondary | ICD-10-CM | POA: Insufficient documentation

## 2023-06-23 DIAGNOSIS — E782 Mixed hyperlipidemia: Secondary | ICD-10-CM | POA: Diagnosis not present

## 2023-06-23 DIAGNOSIS — I10 Essential (primary) hypertension: Secondary | ICD-10-CM | POA: Insufficient documentation

## 2023-06-23 DIAGNOSIS — I351 Nonrheumatic aortic (valve) insufficiency: Secondary | ICD-10-CM | POA: Diagnosis not present

## 2023-06-23 DIAGNOSIS — I5181 Takotsubo syndrome: Secondary | ICD-10-CM | POA: Diagnosis not present

## 2023-06-23 DIAGNOSIS — I5032 Chronic diastolic (congestive) heart failure: Secondary | ICD-10-CM | POA: Diagnosis not present

## 2023-06-23 DIAGNOSIS — Z01810 Encounter for preprocedural cardiovascular examination: Secondary | ICD-10-CM

## 2023-06-23 DIAGNOSIS — I480 Paroxysmal atrial fibrillation: Secondary | ICD-10-CM | POA: Diagnosis not present

## 2023-06-23 LAB — ECHOCARDIOGRAM COMPLETE
AR max vel: 1.6 cm2
AV Area VTI: 1.6 cm2
AV Area mean vel: 1.48 cm2
AV Mean grad: 7 mmHg
AV Peak grad: 13.2 mmHg
Ao pk vel: 1.82 m/s
Area-P 1/2: 4.6 cm2
Height: 65 in
MV M vel: 4.84 m/s
MV Peak grad: 93.7 mmHg
P 1/2 time: 529 ms
Radius: 0.3 cm
S' Lateral: 3.62 cm
Weight: 3808 [oz_av]

## 2023-06-23 MED ORDER — MAGNESIUM OXIDE -MG SUPPLEMENT 400 (240 MG) MG PO TABS: 1.0000 | ORAL_TABLET | Freq: Every day | ORAL | 1 refills | Status: DC

## 2023-06-23 NOTE — Telephone Encounter (Signed)
 Patient identification verified by 2 forms. Shade Flood, RN    PER [facility] team walk-in form received. Patient informed us medications were sent to wrong pharmacy and would like them sent to [facility] at [facility] Need to confirm which medications patient needs corrected.    Tried calling patient. No answer. [id number]

## 2023-06-23 NOTE — Patient Instructions (Signed)
 Medication Instructions:  The current medical regimen is effective;  continue present plan and medications as directed. Please refer to the Current Medication list given to you today.  *If you need a refill on your cardiac medications before your next appointment, please call your pharmacy*  Lab Work: NONE  Testing/Procedures: CONTINUE WITH ECHO  Follow-Up: At [facility], you and your health needs are our priority.  As part of our continuing mission to provide you with exceptional heart care, we have created designated Provider Care Teams.  These Care Teams include your primary Cardiologist (physician) and Advanced Practice Providers (APPs -  Physician Assistants and Nurse Practitioners) who all work together to provide you with the care you need, when you need it.  Your next appointment:   KEEP SCHEDULED FOLLOW UP   Provider:   Thurmon Fair, MD     Other Instructions CONTINUE FLUID RESTRICTION PLEASE PURCHASE AND WEAR COMPRESSION STOCKINGS

## 2023-06-23 NOTE — Addendum Note (Signed)
 Addended by: Alyson Ingles on: 06/23/2023 02:43 PM   Modules accepted: Orders

## 2023-06-24 ENCOUNTER — Other Ambulatory Visit: Payer: Self-pay

## 2023-06-24 ENCOUNTER — Other Ambulatory Visit (HOSPITAL_BASED_OUTPATIENT_CLINIC_OR_DEPARTMENT_OTHER): Payer: Self-pay

## 2023-06-24 ENCOUNTER — Encounter: Payer: Self-pay | Admitting: Physician Assistant

## 2023-06-24 MED ORDER — FUROSEMIDE 20 MG PO TABS
ORAL_TABLET | ORAL | 3 refills | Status: AC
  Filled 2023-06-24: qty 180, 90d supply, fill #0
  Filled 2024-04-12: qty 180, 90d supply, fill #1

## 2023-06-24 MED ORDER — POTASSIUM CHLORIDE CRYS ER 20 MEQ PO TBCR
20.0000 meq | EXTENDED_RELEASE_TABLET | Freq: Two times a day (BID) | ORAL | 2 refills | Status: DC
  Filled 2023-06-24: qty 60, 30d supply, fill #0

## 2023-06-24 MED ORDER — MAGNESIUM OXIDE -MG SUPPLEMENT 400 (240 MG) MG PO TABS
1.0000 | ORAL_TABLET | Freq: Every day | ORAL | 1 refills | Status: AC
  Filled 2023-06-24: qty 120, 120d supply, fill #0

## 2023-06-24 NOTE — Telephone Encounter (Signed)
 Patient identification verified by 2 forms. Shade Flood, RN     Patient returned called. Per Dr. Royann Shivers medications klorcon 20 meq, furosemide 20mg  and magnesium oxide sent to [facility]   Patient agrees with plan, no questions at this time

## 2023-06-25 ENCOUNTER — Other Ambulatory Visit (HOSPITAL_BASED_OUTPATIENT_CLINIC_OR_DEPARTMENT_OTHER): Payer: Self-pay

## 2023-07-01 ENCOUNTER — Encounter (HOSPITAL_COMMUNITY): Payer: Self-pay | Admitting: Gastroenterology

## 2023-07-02 NOTE — Progress Notes (Signed)
 Attempted to obtain medical history for pre op call via telephone, unable to reach at this time. HIPAA compliant voicemail message left requesting return call to pre surgical testing department.

## 2023-07-07 NOTE — H&P (Signed)
 History of Present Illness  78 year old female, previous patient of Dr. Aram Candela outlet dysfunction constipation, anal leakage and discharge, minimal hematochezia,status post pelvic floor physical therapy with modest improvement, advised to remain off Metamucil, undergo GYN evaluation for possible rectocele.  She was noted to have a drop in her Hb and since she has dysphagia, I had recommended an EGD with pillcam deployment in duodenum.         SHe has since been evaluated and diagnosed with rectocele and enterocele, associated with stool trapping and recommended to have surgery. She had a hysterectomy before for a prolapsed uterus.        Colonoscopy 12/27/16,2 sessile serrated polyp removed, repeat in 5 years. Diverticulosis in sigmoid        EGD 12/27/16: Barrett's esophagus, no dysplasia, repeat recommended in 5 years, no H. pylori EGD and colonoscopy with me on 11/07/22 were unremarkable. Labs from 09/19/2022 showed BUN 33, creatinine 1.35, GFR 41, HbA1c 5.9, hemoglobin 11.9, MCV 78.9, platelet 233. As per GYN notes from 09/17/22: she is s/p right sacrospinous ligament fixation (apical approach), enterocele reduction and repair, posterior repair, and proctotomy repair on 01/08/2022 by Dr. Ashley Royalty for management of Stage III posterior pelvic organ prolapse and large enterocele with obstructed defecation.She feels as if the repair has failed and want to be seen by Dr. Ashley Royalty, but will finish her endoscopy and colonoscopy first.  She is not taking senna as often as she did as she feels that lack of control is worse with stools being more liquid. Use os senna causes more mucous in her stools from rectum. Her BMS are variable, she feels that may be there is some improvement. When she had heart failure and needed lasix and had a lot of urination, it worsened rectal?drainage/protusion/prolapse. She has undergone pelvic floor therapy. Without senna, she has 1-2 Bms a day, small BMs, with senna, she feels  it is a "mess", she gets up and feels she needs [place] rig[place] back to have a BM as she feels that stool is stuck. SHe tried metamucil but it caused extra BM and worsening of mucous in stool.Denies blood in stool. She has good control of acid reflux with omeprazole 20 mg 2 pills in am. Denies difficulty or pain on swallowing.  Current Medications Levothyroxine Sodium 75 MCG Tablet 1 tablet in the morning on an empty stomach Orally Once a day Entresto(Sacubitril-Valsartan) 49-51 MG Tablet 1 tablet Orally Twice a day Arnuity Ellipta(Fluticasone Furoate) 50 MCG/ACT Aerosol Powder Breath Activated 1 puff Inhalation Once a day Mometasone Furoate 0.1 % Cream 1 application Externally Twice a day as needed Atorvastatin Calcium 40 MG Tablet TAKE 1 TABLET BY MOUTH EVERY MORNING Orally Once a day Fluticasone Propionate 50 MCG/ACT Suspension 2 sprays (1 spray in each nostril) Nasally Twice a day Omeprazole 20 MG Tablet Delayed Release 2 tablets Orally Once a day Ventolin HFA(Albuterol Sulfate HFA) 108 (90 Base) MCG/ACT Aerosol Solution 2 puffs as needed Inhalation every 4-6 hours , Notes to Pharmacist: hasn't used in years Align - Tablet Chewable 1 tablet Orally once a day Centrum Silver Tablet Chewable 1 tablet Orally once a day Eliquis(Apixaban) 5 MG Tablet 1 tablet Orally twice a day Metoprolol Succinate 100 MG Tablet Extended Release 1/2 tablet in AM Orally and 1/2 tablet in PM Osteo Bi-Flex Regular Strength ? Tablet 1 tablet with a meal Orally Once a day Vitamin D 25 MCG (1000 UT) Capsule 1 capsule Orally Once a day Voltaren(Diclofenac Sodium) 1 % Gel 4 grams Transdermal  twice daily Lasix(Furosemide) 40 MG Tablet 1 tablet as neeed Orally Once a day MetroCream(metroNIDAZOLE) 0.75 % Cream 1 application Externally once a day Allegra 180 MG Tablet 1 tablet Orally once a day Magnesium Oxide 400 MG Tablet 1 tablet Orally Once a day Ketoconazole 2 % Cream 1 application to affected area Externally Once a day  as needed Acetaminophen ER 650 MG Tablet Extended Release 2 tablets as needed Orally every 8 hrs CPAP Amiodarone HCl 200 MG Tablet 1 tablet Orally Once a day Senna 8.6 MG Tablet 1 tablet at bedtime as needed Orally Once a day   Past Medical History Atrial Fibrillation- dxd April 2018--CHRONIC ANTICOAGULATION. Adenomatous polyp: colonoscopy 2/00 neg, 3/05 8mm aden p, 10/08 neg, 08/2011 sdap, 12/2016 2 dimin SSP's. GERD/Barrett's esophagus (Dr. Buccini)--HB and regurg, both upright and supline; Short segment Barrett's since 2000, 12/2016 egd neg for dysplasia. Hypertension. Hypercholesterolemia. CKD- Stage 3. Obesity (s/p lap band surgery 6/10). Migraines. Vitamin D Deficiency. Asthma. Allergic rhinitis . Hypothyroidism. Pre-diabetes. Obstructive sleep apnea (PSG 04/26/14 ESS 16, AHI 115/hr, RDI same, no REM, O2 min 73%).  Surgical History eye surgery on left eye [age] y.o. left inguinal hernia repair 1952 T&A 1974 Vaginal hysterectomy and unilateral oophorectomy 1981 Incidental appendectomy 1981 OS Cataract surgery 07/2006 Lap band surgery and hiatal hernia repair 09/2008 OD cataract extraction Dec 2016 EGD 2001,2006 colonoscopy 2000,2005,2008,2013 AFIB w/ Cardioversion 06/2019  Family History Father: deceased 23 yrs, Died from ruptured esophogus; hx of emphysema, diagnosed with Hypertension Mother: deceased 65 yrs, Died from pulmonary edema, diagnosed with CHF (congestive heart failure), Hypertension Brother 1: alive 77 yrs, GERD Paternal aunt: deceased 34 yrs, Colon cancer, diagnosed with Colon cancer 1 brother(s) - healthy. 1 son(s) , 1 daughter(s) - healthy. Denies family hx ofcolon polyps or liver disease. Paternal Aunts had colon cancer in their 28's.  Social History Tobacco use  cigarettes: Former smoker hx of social smoking in college Quit in year 1972 Tobacco history last updated 10/23/2022 Vaping No EXPOSURE TO PASSIVE SMOKE: in the past - parents. Alcohol: yes,  wine or liquor , social , Rare. Caffeine: yes, coffee 2 x daily, tea occasionally. Recreational drug use: no, no. DIET: high protein. Exercise: yoga daily. Marital Status: Divorced. Children: 1, Boys, 1, girls. 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 Seat belt use: yes. Tobacco Exposure: Passive smoking as a child by parents.  Allergies Demerol: Nausesa & vomiting - Allergy Augmentin: diarrhea - Allergy Clindamycin: lips and facial swelling - Allergy Sulfa Antibiotics: rash - Allergy Codeine: nausea and jittery - Allergy  Vital Signs Wt: 252.6, Ht: 64, BMI: 43.35, Temp: 98.2, Pulse sitting: 68, BP sitting: 115/64. Examination GENERAL APPEARANCE:  Well developed, morbidly obese, no active distress, pleasant.  SCLERA:  anicteric.  CARDIOVASCULAR  Normal RRR .  RESPIRATORY  Breath sounds normal. Respiration even and unlabored.  ABDOMEN  No masses palpated. Liver and spleen not palpated, normal. Bowel sounds normal, Abdomen not distended.  EXTREMITIES:  No edema.  NEURO:  alert, oriented to time, place and person, normal gait.  PSYCH:  mood/affect normal.   Assessments 1. ANEMIA 2.  History of Barrett's esophagus - Z87.19 (Primary) 3. History of adenomatous polyp of colon - Z86.010 4.. Rectocele - N81.6 5. On apixaban therapy - Z79.01 Treatment 1.Proceed with EGD and pillcam deployment at [facility].

## 2023-07-08 ENCOUNTER — Encounter (HOSPITAL_COMMUNITY): Payer: Self-pay | Admitting: Gastroenterology

## 2023-07-08 ENCOUNTER — Other Ambulatory Visit: Payer: Self-pay

## 2023-07-08 ENCOUNTER — Ambulatory Visit (HOSPITAL_COMMUNITY): Admitting: Anesthesiology

## 2023-07-08 ENCOUNTER — Encounter (HOSPITAL_COMMUNITY)
Admission: RE | Disposition: A | Discharge status: Home / Self Care | Payer: Self-pay | Source: Home / Self Care | Attending: Gastroenterology

## 2023-07-08 ENCOUNTER — Other Ambulatory Visit (HOSPITAL_BASED_OUTPATIENT_CLINIC_OR_DEPARTMENT_OTHER): Payer: Self-pay

## 2023-07-08 ENCOUNTER — Ambulatory Visit (HOSPITAL_COMMUNITY)
Admission: RE | Admit: 2023-07-08 | Discharge: 2023-07-08 | Disposition: A | Discharge status: Home / Self Care | Attending: Gastroenterology | Admitting: Gastroenterology

## 2023-07-08 DIAGNOSIS — D509 Iron deficiency anemia, unspecified: Secondary | ICD-10-CM | POA: Diagnosis not present

## 2023-07-08 DIAGNOSIS — E66813 Obesity, class 3: Secondary | ICD-10-CM | POA: Insufficient documentation

## 2023-07-08 DIAGNOSIS — K317 Polyp of stomach and duodenum: Secondary | ICD-10-CM | POA: Insufficient documentation

## 2023-07-08 DIAGNOSIS — N183 Chronic kidney disease, stage 3 unspecified: Secondary | ICD-10-CM | POA: Diagnosis not present

## 2023-07-08 DIAGNOSIS — Z79899 Other long term (current) drug therapy: Secondary | ICD-10-CM | POA: Insufficient documentation

## 2023-07-08 DIAGNOSIS — E039 Hypothyroidism, unspecified: Secondary | ICD-10-CM | POA: Insufficient documentation

## 2023-07-08 DIAGNOSIS — Z79890 Hormone replacement therapy (postmenopausal): Secondary | ICD-10-CM | POA: Insufficient documentation

## 2023-07-08 DIAGNOSIS — Z87891 Personal history of nicotine dependence: Secondary | ICD-10-CM | POA: Diagnosis not present

## 2023-07-08 DIAGNOSIS — Z7951 Long term (current) use of inhaled steroids: Secondary | ICD-10-CM | POA: Diagnosis not present

## 2023-07-08 DIAGNOSIS — G4733 Obstructive sleep apnea (adult) (pediatric): Secondary | ICD-10-CM | POA: Diagnosis not present

## 2023-07-08 DIAGNOSIS — Z9884 Bariatric surgery status: Secondary | ICD-10-CM | POA: Diagnosis not present

## 2023-07-08 DIAGNOSIS — Z6841 Body Mass Index (BMI) 40.0 and over, adult: Secondary | ICD-10-CM | POA: Insufficient documentation

## 2023-07-08 DIAGNOSIS — I130 Hypertensive heart and chronic kidney disease with heart failure and stage 1 through stage 4 chronic kidney disease, or unspecified chronic kidney disease: Secondary | ICD-10-CM | POA: Insufficient documentation

## 2023-07-08 DIAGNOSIS — Z7722 Contact with and (suspected) exposure to environmental tobacco smoke (acute) (chronic): Secondary | ICD-10-CM | POA: Diagnosis not present

## 2023-07-08 DIAGNOSIS — K219 Gastro-esophageal reflux disease without esophagitis: Secondary | ICD-10-CM | POA: Diagnosis not present

## 2023-07-08 DIAGNOSIS — I4891 Unspecified atrial fibrillation: Secondary | ICD-10-CM | POA: Diagnosis not present

## 2023-07-08 DIAGNOSIS — I5043 Acute on chronic combined systolic (congestive) and diastolic (congestive) heart failure: Secondary | ICD-10-CM | POA: Diagnosis not present

## 2023-07-08 DIAGNOSIS — I509 Heart failure, unspecified: Secondary | ICD-10-CM | POA: Diagnosis not present

## 2023-07-08 DIAGNOSIS — J45909 Unspecified asthma, uncomplicated: Secondary | ICD-10-CM | POA: Insufficient documentation

## 2023-07-08 DIAGNOSIS — Z90710 Acquired absence of both cervix and uterus: Secondary | ICD-10-CM | POA: Insufficient documentation

## 2023-07-08 DIAGNOSIS — I110 Hypertensive heart disease with heart failure: Secondary | ICD-10-CM | POA: Diagnosis not present

## 2023-07-08 SURGERY — EGD (ESOPHAGOGASTRODUODENOSCOPY)
Anesthesia: Monitor Anesthesia Care

## 2023-07-08 MED ORDER — PROPOFOL 500 MG/50ML IV EMUL
INTRAVENOUS | Status: AC
Start: 2023-07-08 — End: ?
  Filled 2023-07-08: qty 50

## 2023-07-08 MED ORDER — LIDOCAINE 2% (20 MG/ML) 5 ML SYRINGE
INTRAMUSCULAR | Status: DC | PRN
  Administered 2023-07-08: 60 mg via INTRAVENOUS

## 2023-07-08 MED ORDER — SODIUM CHLORIDE 0.9 % IV SOLN
INTRAVENOUS | Status: AC | PRN
  Administered 2023-07-08: 250 mL via INTRAVENOUS

## 2023-07-08 MED ORDER — PROPOFOL 500 MG/50ML IV EMUL
INTRAVENOUS | Status: DC | PRN
  Administered 2023-07-08: 100 ug/kg/min via INTRAVENOUS

## 2023-07-08 MED ORDER — PROPOFOL 10 MG/ML IV BOLUS
INTRAVENOUS | Status: DC | PRN
  Administered 2023-07-08: 70 mg via INTRAVENOUS
  Administered 2023-07-08: 20 mg via INTRAVENOUS

## 2023-07-08 SURGICAL SUPPLY — 1 items: TOWEL COTTON PACK 4EA (MISCELLANEOUS) ×4 IMPLANT

## 2023-07-08 NOTE — Transfer of Care (Signed)
 Immediate Anesthesia Transfer of Care Note  Patient: Leslie [name]  Procedure(s) Performed: EGD (ESOPHAGOGASTRODUODENOSCOPY) IMAGING PROCEDURE, GI TRACT, INTRALUMINAL, VIA CAPSULE  Patient Location: PACU  Anesthesia Type:MAC  Level of Consciousness: awake, alert , oriented, and patient cooperative  Airway & Oxygen Therapy: Patient Spontanous Breathing and Patient connected to face mask oxygen  Post-op Assessment: Report given to RN and Post -op Vital signs reviewed and stable  Post vital signs: Reviewed and stable  Last Vitals:  Vitals Value Taken Time  BP    Temp    Pulse    Resp    SpO2      Last Pain:  Vitals:   07/08/23 1004  TempSrc: Temporal  PainSc: 3       Patients Stated Pain Goal: 3 (07/08/23 1004)  Complications: No notable events documented.

## 2023-07-08 NOTE — Op Note (Signed)
 [facility] Patient Name: Leslie [name] Procedure Date: 07/08/2023 MRN: [id number] Attending MD: Kerin Salen , MD, [id number] Date of Birth: [birth year] CSN: [id number] Age: 78 Admit Type: Outpatient Procedure:                Upper GI endoscopy Indications:              Unexplained iron deficiency anemia Providers:                Kerin Salen, MD, Marge Duncans, RN, Alan Ripper,                            Technician Referring MD:             Lorin Mercy Medicines:                Monitored Anesthesia Care Complications:            No immediate complications. Estimated Blood Loss:     Estimated blood loss: none. Procedure:                Pre-Anesthesia Assessment:                           - Prior to the procedure, a History and Physical                            was performed, and patient medications and                            allergies were reviewed. The patient's tolerance of                            previous anesthesia was also reviewed. The risks                            and benefits of the procedure and the sedation                            options and risks were discussed with the patient.                            All questions were answered, and informed consent                            was obtained. Prior Anticoagulants: The patient has                            taken Eliquis (apixaban), last dose was 3 days                            prior to procedure. ASA Grade Assessment: IV - A                            patient with severe systemic disease that is a  constant threat to life. After reviewing the risks                            and benefits, the patient was deemed in                            satisfactory condition to undergo the procedure.                           After obtaining informed consent, the endoscope was                            passed under direct vision. Throughout the                             procedure, the patient's blood pressure, pulse, and                            oxygen saturations were monitored continuously. The                            [id number]-H190 ([id number]) Olympus endoscope was introduced                            through the mouth, and advanced to the second part                            of duodenum. The upper GI endoscopy was                            accomplished without difficulty. The patient                            tolerated the procedure well. Scope In: Scope Out: Findings:      The examined esophagus was normal.      Multiple small sessile polyps with no bleeding and stigmata of recent       bleeding were found in the cardia, in the gastric fundus and in the       gastric body.      The examined duodenum was normal. Using the endoscope, the video capsule       enteroscope was advanced into the duodenal bulb.      The cardia and gastric fundus were normal on retroflexion. Impression:               - Normal esophagus.                           - Multiple gastric polyps.                           - Normal examined duodenum.                           - Successful completion of the Video Capsule  Enteroscope placement.                           - No specimens collected. Moderate Sedation:      Patient did not receive moderate sedation for this procedure, but       instead received monitored anesthesia care. Recommendation:           - Patient has a contact number available for                            emergencies. The signs and symptoms of potential                            delayed complications were discussed with the                            patient. Return to normal activities tomorrow.                            Written discharge instructions were provided to the                            patient.                           - Advance diet as per instructions for pillcam.                           - Continue  present medications. Procedure Code(s):        --- Professional ---                           [phone], Esophagogastroduodenoscopy, flexible,                            transoral; diagnostic, including collection of                            specimen(s) by brushing or washing, when performed                            (separate procedure) Diagnosis Code(s):        --- Professional ---                           K31.7, Polyp of stomach and duodenum                           D50.9, Iron deficiency anemia, unspecified CPT copyright 2022 American Medical Association. All rights reserved. The codes documented in this report are preliminary and upon coder review may  be revised to meet current compliance requirements. Kerin Salen, MD 07/08/2023 12:02:43 PM This report has been signed electronically. Number of Addenda: 0

## 2023-07-08 NOTE — Progress Notes (Signed)
 Pill cam deployed w/out difficulty.  Device paired and verified it is taking pictures.

## 2023-07-08 NOTE — Anesthesia Postprocedure Evaluation (Signed)
 Anesthesia Post Note  Patient: Leslie [name]  Procedure(s) Performed: EGD (ESOPHAGOGASTRODUODENOSCOPY) IMAGING PROCEDURE, GI TRACT, INTRALUMINAL, VIA CAPSULE     Patient location during evaluation: PACU Anesthesia Type: MAC Level of consciousness: awake and alert Pain management: pain level controlled Vital Signs Assessment: post-procedure vital signs reviewed and stable Respiratory status: spontaneous breathing, nonlabored ventilation and respiratory function stable Cardiovascular status: stable and blood pressure returned to baseline Anesthetic complications: no  No notable events documented.  Last Vitals:  Vitals:   07/08/23 1210 07/08/23 1220  BP: (!) 118/50 (!) 119/56  Pulse: (!) 51 (!) 52  Resp: 13 19  Temp:    SpO2: 96% 94%    Last Pain:  Vitals:   07/08/23 1220  TempSrc:   PainSc: 0-No pain                 Beryle Lathe

## 2023-07-08 NOTE — Anesthesia Preprocedure Evaluation (Addendum)
 Anesthesia Evaluation  Patient identified by MRN, date of birth, ID band Patient awake    Reviewed: Allergy & Precautions, NPO status , Patient's Chart, lab work & pertinent test results, reviewed documented beta blocker date and time   History of Anesthesia Complications (+) PONV and history of anesthetic complications  Airway Mallampati: II  TM Distance: >3 FB Neck ROM: Full    Dental  (+) Dental Advisory Given, Teeth Intact   Pulmonary asthma , sleep apnea and Continuous Positive Airway Pressure Ventilation , former smoker   Pulmonary exam normal        Cardiovascular hypertension, Pt. on medications and Pt. on home beta blockers Normal cardiovascular exam+ dysrhythmias Atrial Fibrillation + Valvular Problems/Murmurs AI and MR    '25 TTE - EF 55 to 60%. Grade II diastolic dysfunction (pseudonormalization). Left atrial size was severely dilated. Right atrial size was severely dilated. Severe mitral valve regurgitation. Tricuspid valve regurgitation is moderate. Aortic valve regurgitation is mild to moderate.     Neuro/Psych  Headaches  negative psych ROS   GI/Hepatic Neg liver ROS, hiatal hernia,GERD  Medicated and Controlled,, S/p lap band    Endo/Other  Hypothyroidism  Class 3 obesity  Renal/GU CRFRenal disease     Musculoskeletal  (+) Arthritis ,    Abdominal   Peds  Hematology  On eliquis    Anesthesia Other Findings   Reproductive/Obstetrics                             Anesthesia Physical Anesthesia Plan  ASA: 4  Anesthesia Plan: MAC   Post-op Pain Management: Minimal or no pain anticipated   Induction:   PONV Risk Score and Plan: 3 and Propofol infusion and Treatment may vary due to age or medical condition  Airway Management Planned: Nasal Cannula and Natural Airway  Additional Equipment: None  Intra-op Plan:   Post-operative Plan:   Informed Consent: I have  reviewed the patients History and Physical, chart, labs and discussed the procedure including the risks, benefits and alternatives for the proposed anesthesia with the patient or authorized representative who has indicated his/her understanding and acceptance.       Plan Discussed with: CRNA and Anesthesiologist  Anesthesia Plan Comments:         Anesthesia Quick Evaluation

## 2023-07-08 NOTE — Interval H&P Note (Signed)
 History and Physical Interval Note: 78/female for EGD with pillcam deployment for acute drop in Hb and anemia. 07/08/2023 10:33 AM  Leslie [name]  has presented today for EGD with pillcam deployment, with the diagnosis of Iron deficiency anemia.  The various methods of treatment have been discussed with the patient and family. After consideration of risks, benefits and other options for treatment, the patient has consented to  Procedure(s): EGD (ESOPHAGOGASTRODUODENOSCOPY) (N/A) IMAGING PROCEDURE, GI TRACT, INTRALUMINAL, VIA CAPSULE (N/A) as a surgical intervention.  The patient's history has been reviewed, patient examined, no change in status, stable for surgery.  I have reviewed the patient's chart and labs.  Questions were answered to the patient's satisfaction.     Kerin Salen

## 2023-07-08 NOTE — Discharge Instructions (Signed)

## 2023-07-10 ENCOUNTER — Encounter (HOSPITAL_COMMUNITY): Payer: Self-pay | Admitting: Gastroenterology

## 2023-07-10 ENCOUNTER — Other Ambulatory Visit (HOSPITAL_BASED_OUTPATIENT_CLINIC_OR_DEPARTMENT_OTHER): Payer: Self-pay

## 2023-07-10 DIAGNOSIS — D509 Iron deficiency anemia, unspecified: Secondary | ICD-10-CM | POA: Diagnosis not present

## 2023-07-11 ENCOUNTER — Other Ambulatory Visit (HOSPITAL_BASED_OUTPATIENT_CLINIC_OR_DEPARTMENT_OTHER): Payer: Self-pay

## 2023-07-11 MED ORDER — SERTRALINE HCL 25 MG PO TABS
25.0000 mg | ORAL_TABLET | Freq: Every day | ORAL | 3 refills | Status: AC
  Filled 2023-07-11 (×2): qty 90, 90d supply, fill #0
  Filled 2023-10-07: qty 90, 90d supply, fill #1
  Filled 2024-01-13: qty 90, 90d supply, fill #2
  Filled 2024-04-12: qty 90, 90d supply, fill #3

## 2023-07-17 NOTE — Progress Notes (Unsigned)
 Electrophysiology Office Note:    Date:  07/17/2023   ID:  Leslie [name], Leslie [name] [birth year], MRN [id number]  [facility] Cardiologist:  Thurmon Fair, MD  [facility] Electrophysiologist:  Lanier Prude, MD   Referring MD: Shon Hale, *   Chief Complaint: Atrial fibrillation  History of Present Illness:    Leslie [name] is a 78 year old woman who I am seeing today for an evaluation of atrial fibrillation at the request of Dr. Royann Shivers.  The patient has a history of hypertension, atrial fibrillation, CKD 3, obesity, sleep apnea on CPAP.  Her atrial fibrillation was diagnosed in 2018.  She has had multiple cardioversions in the past.  She has been on flecainide in the past.  The flecainide was stopped for lack of effect and she was transition to amiodarone.  There is series of medications in the chart from the primary care physician about symptomatic anemia.  Her hemoglobin was as low as 7 and she required transfusion.  She also required iron transfusion.  There were discussions about rhythm control and stroke risk mitigation using left atrial appendage occlusion.  She presents today to discuss things.  She is doing okay today.  She reports an improvement in her energy level as her hemoglobin is improved.  No GI source has been identified.     Their past medical, social and family history was reviewed.   ROS:   Please see the history of present illness.    All other systems reviewed and are negative.  EKGs/Labs/Other Studies Reviewed:    The following studies were reviewed today:  June 23, 2023 echo EF 55-60 RV normal Severely dilated left and right atrium Severe mitral regurgitation No mitral stenosis Moderate tricuspid regurgitation  February 13, 2023 ZIO monitor No atrial fibrillation  June 23, 2023 EKG shows sinus rhythm.  August 01, 2016 transesophageal echo personally reviewed Left atrial appendage anatomy appears suitable for closure. Mild to  moderate MR Dilated left atrium        Physical Exam:    VS:  There were no vitals taken for this visit.    Wt Readings from Last 3 Encounters:  07/08/23 225 lb (102.1 kg)  06/23/23 238 lb (108 kg)  06/19/23 238 lb 9.6 oz (108.2 kg)     GEN: no distress CARD: RRR, No MRG RESP: No IWOB. CTAB.        ASSESSMENT AND PLAN:    1. Persistent atrial fibrillation (HCC)   2. Encounter for long-term (current) use of high-risk medication   3. Severe mitral regurgitation     #Persistent atrial fibrillation #High risk med monitoring-amiodarone The patient is maintaining sinus rhythm on amiodarone. LFTs and TSH acceptable for ongoing amiodarone use on recent lab work.  I discussed treatment options for her atrial fibrillation during today's clinic appointment.  We discussed continuing with amiodarone versus pursuing catheter ablation.  Given the severity of her atrial dilation and mitral regurgitation, I recommended continuing with antiarrhythmic drugs.  We discussed her stroke risk during today's appointment.  We discussed anticoagulation and left atrial appendage occlusion.  We discussed her history of symptomatic anemia requiring transfusion.  I think she is overall acceptable candidate for left atrial appendage occlusion.  -----------  I have seen Leslie [name] in the office today who is being considered for a Watchman left atrial appendage closure device. I believe they will benefit from this procedure given their history of atrial fibrillation, CHA2DS2-VASc score of 5. Unfortunately, the patient is not felt  to be a long term anticoagulation candidate secondary to symptomatic anemia. The patient's chart has been reviewed and I feel that they would be a candidate for short term oral anticoagulation after Watchman implant.   It is my belief that after undergoing a LAA closure procedure, Leslie [name] will not need long term anticoagulation which eliminates anticoagulation side  effects and major bleeding risk.   Procedural risks for the Watchman implant have been reviewed with the patient including a 0.5% risk of stroke, <1% risk of perforation and <1% risk of device embolization. Other risks include bleeding, vascular damage, tamponade, worsening renal function, and death. The patient understands these risk and wishes to proceed.     The published clinical data on the safety and effectiveness of WATCHMAN include but are not limited to the following: - Holmes DR, Everlene Farrier, Sick P et al. for the PROTECT AF Investigators. Percutaneous closure of the left atrial appendage versus warfarin therapy for prevention of stroke in patients with atrial fibrillation: a randomised non-inferiority trial. Lancet 2009; 374: 534-42. Everlene Farrier, Doshi SK, Isa Rankin D et al. on behalf of the PROTECT AF Investigators. Percutaneous Left Atrial Appendage Closure for Stroke Prophylaxis in Patients With Atrial Fibrillation 2.3-Year Follow-up of the PROTECT AF (Watchman Left Atrial Appendage System for Embolic Protection in Patients With Atrial Fibrillation) Trial. Circulation 2013; 127:720-729. - Alli O, Doshi S,  Kar S, Reddy VY, Sievert H et al. Quality of Life Assessment in the Randomized PROTECT AF (Percutaneous Closure of the Left Atrial Appendage Versus Warfarin Therapy for Prevention of Stroke in Patients With Atrial Fibrillation) Trial of Patients at Risk for Stroke With Nonvalvular Atrial Fibrillation. J Am Coll Cardiol 2013; 61:1790-8. Aline August DR, Mia [place], Price M, Whisenant B, Sievert H, Doshi S, Huber K, Reddy V. Prospective randomized evaluation of the Watchman left atrial appendage Device in patients with atrial fibrillation versus long-term warfarin therapy; the PREVAIL trial. Journal of the [employer] of Cardiology, Vol. 4, No. 1, 2014, 1-11. - Kar S, Doshi SK, Sadhu A, Horton R, Osorio J et al. Primary outcome evaluation of a next-generation left atrial appendage closure  device: results from the PINNACLE FLX trial. Circulation 2021;143(18)1754-1762.    After today's visit with the patient which was dedicated solely for shared decision making visit regarding LAA closure device, the patient decided to proceed with the LAA appendage closure procedure scheduled to be done in the near future at [facility].  Given abnormal kidney function, plan for no CT scan.   HAS-BLED score 3 Hypertension Yes  Abnormal renal and liver function (Dialysis, transplant, Cr >2.26 mg/dL /Cirrhosis or Bilirubin >2x Normal or AST/ALT/AP >3x Normal) No  Stroke No  Bleeding Yes  Labile INR (Unstable/high INR) No  Elderly (>65) Yes  Drugs or alcohol (>= 8 drinks/week, anti-plt or NSAID) No   CHA2DS2-VASc Score = 5  The patient's score is based upon: CHF History: 1 HTN History: 1 Diabetes History: 0 Stroke History: 0 Vascular Disease History: 0 Age Score: 2 Gender Score: 1   #Severe mitral regurgitation Plan to further evaluate by TEE day of Watchman procedure May ultimately require transcatheter intervention   I discussed her case with Dr. Royann Shivers and he is in agreement.  Signed, Rossie Muskrat. Lalla Brothers, MD, [facility], [facility] 07/17/2023 9:04 PM    Electrophysiology [facility]

## 2023-07-18 ENCOUNTER — Encounter: Payer: Self-pay | Admitting: Cardiology

## 2023-07-18 ENCOUNTER — Ambulatory Visit: Payer: PPO | Attending: Internal Medicine | Admitting: Cardiology

## 2023-07-18 VITALS — BP 118/82 | HR 57 | Ht 65.0 in | Wt 224.2 lb

## 2023-07-18 DIAGNOSIS — I340 Nonrheumatic mitral (valve) insufficiency: Secondary | ICD-10-CM

## 2023-07-18 DIAGNOSIS — I4819 Other persistent atrial fibrillation: Secondary | ICD-10-CM | POA: Diagnosis not present

## 2023-07-18 DIAGNOSIS — Z79899 Other long term (current) drug therapy: Secondary | ICD-10-CM

## 2023-07-18 NOTE — Patient Instructions (Signed)
 Medication Instructions:  Your physician recommends that you continue on your current medications as directed. Please refer to the Current Medication list given to you today.  *If you need a refill on your cardiac medications before your next appointment, please call your pharmacy*  Testing/Procedures: Watchman  Your physician has requested that you have Left atrial appendage (LAA) closure device implantation is a procedure to put a small device in the LAA of the heart. The LAA is a small sac in the wall of the heart's left upper chamber. Blood clots can form in this area. The device, Watchman closes the LAA to help prevent a blood clot and stroke.   Follow-Up: At [facility], you and your health needs are our priority.  As part of our continuing mission to provide you with exceptional heart care, our providers are all part of one team.  This team includes your primary Cardiologist (physician) and Advanced Practice Providers or APPs (Physician Assistants and Nurse Practitioners) who all work together to provide you with the care you need, when you need it.  Your next appointment:   You will be contacted by Nurse Navigator, Karsten Fells to schedule your pre-procedure visit and procedure date. If you have any questions she can be reached at [phone].        1st Floor: - Lobby - Registration  - Pharmacy  - Lab - Cafe  2nd Floor: - PV Lab - Diagnostic Testing (echo, CT, nuclear med)  3rd Floor: - Vacant  4th Floor: - TCTS (cardiothoracic surgery) - AFib Clinic - Structural Heart Clinic - Vascular Surgery  - Vascular Ultrasound  5th Floor: - HeartCare Cardiology (general and EP) - Clinical Pharmacy for coumadin, hypertension, lipid, weight-loss medications, and med management appointments    Valet parking services will be available as well.

## 2023-07-23 DIAGNOSIS — I340 Nonrheumatic mitral (valve) insufficiency: Secondary | ICD-10-CM | POA: Diagnosis not present

## 2023-07-23 DIAGNOSIS — R634 Abnormal weight loss: Secondary | ICD-10-CM | POA: Diagnosis not present

## 2023-07-23 DIAGNOSIS — R000 Tachycardia, unspecified: Secondary | ICD-10-CM | POA: Diagnosis not present

## 2023-07-24 DIAGNOSIS — G4733 Obstructive sleep apnea (adult) (pediatric): Secondary | ICD-10-CM | POA: Diagnosis not present

## 2023-07-25 ENCOUNTER — Encounter: Payer: Self-pay | Admitting: Cardiovascular Disease

## 2023-07-29 DIAGNOSIS — I10 Essential (primary) hypertension: Secondary | ICD-10-CM | POA: Diagnosis not present

## 2023-07-31 ENCOUNTER — Inpatient Hospital Stay: Payer: PPO | Admitting: Internal Medicine

## 2023-07-31 ENCOUNTER — Inpatient Hospital Stay: Payer: PPO | Attending: Internal Medicine

## 2023-07-31 VITALS — BP 118/75 | HR 66 | Temp 98.5°F | Resp 16 | Ht 65.0 in | Wt 223.6 lb

## 2023-07-31 DIAGNOSIS — D500 Iron deficiency anemia secondary to blood loss (chronic): Secondary | ICD-10-CM

## 2023-07-31 DIAGNOSIS — D509 Iron deficiency anemia, unspecified: Secondary | ICD-10-CM | POA: Insufficient documentation

## 2023-07-31 LAB — CBC WITH DIFFERENTIAL (CANCER CENTER ONLY)
Abs Immature Granulocytes: 0.02 10*3/uL (ref 0.00–0.07)
Basophils Absolute: 0.1 10*3/uL (ref 0.0–0.1)
Basophils Relative: 1 %
Eosinophils Absolute: 0.2 10*3/uL (ref 0.0–0.5)
Eosinophils Relative: 3 %
HCT: 38.5 % (ref 36.0–46.0)
Hemoglobin: 11.9 g/dL — ABNORMAL LOW (ref 12.0–15.0)
Immature Granulocytes: 0 %
Lymphocytes Relative: 19 %
Lymphs Abs: 1.2 10*3/uL (ref 0.7–4.0)
MCH: 25.1 pg — ABNORMAL LOW (ref 26.0–34.0)
MCHC: 30.9 g/dL (ref 30.0–36.0)
MCV: 81.2 fL (ref 80.0–100.0)
Monocytes Absolute: 0.5 10*3/uL (ref 0.1–1.0)
Monocytes Relative: 8 %
Neutro Abs: 4.3 10*3/uL (ref 1.7–7.7)
Neutrophils Relative %: 69 %
Platelet Count: 228 10*3/uL (ref 150–400)
RBC: 4.74 MIL/uL (ref 3.87–5.11)
RDW: 24.6 % — ABNORMAL HIGH (ref 11.5–15.5)
WBC Count: 6.2 10*3/uL (ref 4.0–10.5)
nRBC: 0 % (ref 0.0–0.2)

## 2023-07-31 LAB — IRON AND IRON BINDING CAPACITY (CC-WL,HP ONLY)
Iron: 41 ug/dL (ref 28–170)
Saturation Ratios: 10 % — ABNORMAL LOW (ref 10.4–31.8)
TIBC: 396 ug/dL (ref 250–450)
UIBC: 355 ug/dL (ref 148–442)

## 2023-07-31 LAB — SAMPLE TO BLOOD BANK

## 2023-07-31 LAB — FERRITIN: Ferritin: 15 ng/mL (ref 11–307)

## 2023-07-31 NOTE — Progress Notes (Signed)
 [facility] Telephone:([phone]   [phone]  OFFICE PROGRESS NOTE  Shon Hale, MD [address] [place] [address]  DIAGNOSIS: Iron deficiency anemia.  PRIOR THERAPY: Iron infusion with Venofer 300 Mg IV every week x3 weeks.  Last dose was given in March 2025  CURRENT THERAPY: None  INTERVAL HISTORY: Leslie [name] 78 y.o. female returns to the clinic today for follow-up visit.Discussed the use of AI scribe software for clinical note transcription with the patient, who gave verbal consent to proceed.  History of Present Illness   Leslie [name] "[name]" is a 78 year old female with iron deficiency anemia who presents for evaluation and repeat blood work post iron infusion treatment.  She feels better since her last visit but continues to experience dizziness and an increased heart rate upon standing or engaging in activities. No chest pain, shortness of breath, or palpitations.  She received three iron infusions with Venofer and one blood transfusion, with the last infusion administered in March 2025 at the [facility]. Her hemoglobin improved from 7.0 g/dL on May 29, 2023, to 11.9 g/dL at the current visit, and was 13.4 g/dL at her family doctor's lab on July 23, 2023. Her iron level was 62 mcg/dL with an iron saturation of 14%.  She underwent a capsule endoscopy which showed no abnormalities.  She was recently taken off amlodipine by her primary care physician for a few days and has not been instructed to resume it yet.       MEDICAL HISTORY: Past Medical History:  Diagnosis Date   Adenomatous polyp    Arthritis    "knees, legs, fingers" (07/30/2016)   Asthma    Bursitis of left shoulder    "just finished PT" (07/30/2016)   Chest pain    a. 2003 Abnl stress test-->Cath: nonobs CAD.   CHF (congestive heart failure) (HCC)    Chronic bronchitis (HCC)    Chronic kidney disease    stage 3   GERD  (gastroesophageal reflux disease)    barrett's esophagus- Dr. Matthias Hughs   History of hiatal hernia    Hyperlipidemia    Hypertension    Hypothyroidism    Migraines    "sporatic; at least a few/year" (07/30/2016)   Mitral regurgitation    a. 07/2016 Echo: mild to mod MR;  b. 07/2016 TEE: mild MR.   Moderate aortic insufficiency    a. 07/2016 Echo: EF 55-60%, mod AI;  b. 07/2016 TEE: EF 50-55%, mod AI.   Obesity    s/p lap band surgery 6/10   OSA on CPAP    PAF (paroxysmal atrial fibrillation) (HCC)    a. 07/2016 TEE/DCCV: EF 50-55%, mild MR, mod AI, mild to mod TR, neg bubble study-->successful DCCV x1 w/ 120J.   PONV (postoperative nausea and vomiting)    Vitamin D deficiency     ALLERGIES:  is allergic to clindamycin hcl, augmentin [amoxicillin-pot clavulanate], codeine, demerol [meperidine], and sulfa antibiotics.  MEDICATIONS:  Current Outpatient Medications  Medication Sig Dispense Refill   acetaminophen (TYLENOL) 650 MG CR tablet Take 1,300 mg by mouth at bedtime as needed for pain.     ALPRAZolam (XANAX) 0.25 MG tablet Take 1 tablet (0.25 mg total) by mouth as needed for anxiety (take 1 to 2 tablets for each of your upcoming flights). 4 tablet 0   amiodarone (PACERONE) 200 MG tablet Take 1 tablet (200 mg total) by mouth daily. 90 tablet  3   amLODipine (NORVASC) 5 MG tablet Take 1 tablet (5 mg total) by mouth daily. 90 tablet 3   apixaban (ELIQUIS) 5 MG TABS tablet Take 1 tablet (5 mg total) by mouth 2 (two) times daily. 180 tablet 1   ARNUITY ELLIPTA 50 MCG/ACT AEPB Inhale 1 puff into the lungs at bedtime.     atorvastatin (LIPITOR) 40 MG tablet Take 1 tablet (40 mg total) by mouth daily. 90 tablet 3   atorvastatin (LIPITOR) 40 MG tablet Take 1 tablet (40 mg total) by mouth in the morning. 90 tablet 3   Bacillus Coagulans-Inulin (ALIGN PREBIOTIC-PROBIOTIC PO) Taking 2 Gummies by mouth daily     carvedilol (COREG) 12.5 MG tablet Take 2 tablets (25 mg total) by mouth every morning AND  1 tablet (12.5 mg total) at bedtime. 270 tablet 3   cholecalciferol (VITAMIN D) 1000 UNITS tablet Take 1,000 Units by mouth daily.     diclofenac Sodium (VOLTAREN) 1 % GEL Apply 1 application topically 2 (two) times daily.     fexofenadine (ALLEGRA) 180 MG tablet Take 180 mg by mouth daily.     fluticasone (FLONASE) 50 MCG/ACT nasal spray Place 2 sprays into both nostrils at bedtime.      Fluticasone Furoate (ARNUITY ELLIPTA) 50 MCG/ACT AEPB Inhale 1 puff by mouth daily. 90 each 3   furosemide (LASIX) 20 MG tablet Take 1 tablet (20 mg) daily. May take an additional 1 tablet (20 mg) on the days of increase swelling, weight gain of 3 lb overnight or 5 lb in 1 week. 180 tablet 3   Glucosamine-Chondroitin (OSTEO BI-FLEX REGULAR STRENGTH PO) Take 1 tablet by mouth daily.     levothyroxine (SYNTHROID) 75 MCG tablet Take 75 mcg by mouth every morning.     levothyroxine (SYNTHROID) 75 MCG tablet Take 1 tablet (75 mcg total) by mouth every morning on an empty stomach. 90 tablet 0   magnesium oxide (MAG-OX) 400 (240 Mg) MG tablet Take 1 tablet (400 mg total) by mouth daily. 120 tablet 1   metroNIDAZOLE (METROCREAM) 0.75 % cream Apply 1 application  topically daily.     mometasone (ELOCON) 0.1 % cream Apply 1 application  topically 2 (two) times daily as needed (skin irritation).     Multiple Vitamins-Minerals (CENTRUM MULTIGUMMIES) CHEW Takes 1 gummy by mouth daily     omeprazole (PRILOSEC) 20 MG capsule Take 40 mg by mouth daily before breakfast.     potassium chloride SA (KLOR-CON M) 20 MEQ tablet Take 1 tablet (20 mEq total) by mouth 2 (two) times daily. 60 tablet 2   predniSONE (DELTASONE) 20 MG tablet Take 20 mg a day for 3 days 3 tablet 0   sertraline (ZOLOFT) 25 MG tablet Take 1 tablet (25 mg total) by mouth daily. 90 tablet 3   No current facility-administered medications for this visit.    SURGICAL HISTORY:  Past Surgical History:  Procedure Laterality Date   APPENDECTOMY     incidental    CARDIOVERSION N/A 08/01/2016   Procedure: CARDIOVERSION;  Surgeon: Hugh Madura, MD;  Location: [facility] ENDOSCOPY;  Service: Cardiovascular;  Laterality: N/A;   CARDIOVERSION N/A 06/24/2019   Procedure: CARDIOVERSION;  Surgeon: Luana Rumple, MD;  Location: [facility] ENDOSCOPY;  Service: Cardiovascular;  Laterality: N/A;   CARDIOVERSION N/A 05/30/2020   Procedure: CARDIOVERSION;  Surgeon: Luana Rumple, MD;  Location: [facility] ENDOSCOPY;  Service: Cardiovascular;  Laterality: N/A;   CARDIOVERSION N/A 05/09/2020   Procedure: CARDIOVERSION;  Surgeon: Wendie Hamburg, MD;  Location: [facility] ENDOSCOPY;  Service: Cardiovascular;  Laterality: N/A;   CATARACT EXTRACTION W/ INTRAOCULAR LENS  IMPLANT, BILATERAL Bilateral ~ 2008-2017   left - right   COMBINED HYSTERECTOMY VAGINAL / OOPHORECTOMY / A&P REPAIR  1981   ESOPHAGOGASTRODUODENOSCOPY N/A 07/08/2023   Procedure: EGD (ESOPHAGOGASTRODUODENOSCOPY);  Surgeon: Genell Ken, MD;  Location: [facility] ENDOSCOPY;  Service: Gastroenterology;  Laterality: N/A;   GIVENS CAPSULE STUDY N/A 07/08/2023   Procedure: IMAGING PROCEDURE, GI TRACT, INTRALUMINAL, VIA CAPSULE;  Surgeon: Genell Ken, MD;  Location: [facility] ENDOSCOPY;  Service: Gastroenterology;  Laterality: N/A;   HERNIA REPAIR     INGUINAL HERNIA REPAIR Left 1952   LAPAROSCOPIC GASTRIC BANDING WITH HIATAL HERNIA REPAIR  09/2008   RIGHT/LEFT HEART CATH AND CORONARY ANGIOGRAPHY N/A 04/11/2022   Procedure: RIGHT/LEFT HEART CATH AND CORONARY ANGIOGRAPHY;  Surgeon: Lucendia Rusk, MD;  Location: [facility] INVASIVE CV LAB;  Service: Cardiovascular;  Laterality: N/A;   TEE WITHOUT CARDIOVERSION N/A 08/01/2016   Procedure: TRANSESOPHAGEAL ECHOCARDIOGRAM (TEE);  Surgeon: Hugh Madura, MD;  Location: [facility] ENDOSCOPY;  Service: Cardiovascular;  Laterality: N/A;   TONSILLECTOMY AND ADENOIDECTOMY  1974   VAGINAL HYSTERECTOMY  1981   "w/1 ovary"    REVIEW OF SYSTEMS:  A comprehensive review of systems was negative except for: Constitutional:  positive for fatigue Neurological: positive for dizziness   PHYSICAL EXAMINATION: General appearance: alert, cooperative, fatigued, and no distress Head: Normocephalic, without obvious abnormality, atraumatic Neck: no adenopathy, no JVD, supple, symmetrical, trachea midline, and thyroid not enlarged, symmetric, no tenderness/mass/nodules Lymph nodes: Cervical, supraclavicular, and axillary nodes normal. Resp: clear to auscultation bilaterally Back: symmetric, no curvature. ROM normal. No CVA tenderness. Cardio: regular rate and rhythm, S1, S2 normal, no murmur, click, rub or gallop GI: soft, non-tender; bowel sounds normal; no masses,  no organomegaly Extremities: extremities normal, atraumatic, no cyanosis or edema  ECOG PERFORMANCE STATUS: 1 - Symptomatic but completely ambulatory  Blood pressure 118/75, pulse 66, temperature 98.5 F (36.9 C), temperature source Temporal, resp. rate 16, height 5\' 5"  (1.651 m), weight 223 lb 9.6 oz (101.4 kg), SpO2 100%.  LABORATORY DATA: Lab Results  Component Value Date   WBC 6.2 07/31/2023   HGB 11.9 (L) 07/31/2023   HCT 38.5 07/31/2023   MCV 81.2 07/31/2023   PLT 228 07/31/2023      Chemistry      Component Value Date/Time   NA 138 05/31/2023 1358   NA 141 05/21/2023 1159   K 3.6 05/31/2023 1358   CL 105 05/31/2023 1358   CO2 22 05/31/2023 1358   BUN 17 05/31/2023 1358   BUN 23 05/21/2023 1159   CREATININE 1.36 (H) 05/31/2023 1358   CREATININE 1.31 (H) 05/29/2023 0846   CREATININE 1.49 (H) 08/13/2016 1556      Component Value Date/Time   CALCIUM 8.5 (L) 05/31/2023 1358   ALKPHOS 66 05/31/2023 1358   AST 23 05/31/2023 1358   AST 18 05/29/2023 0846   ALT 23 05/31/2023 1358   ALT 20 05/29/2023 0846   BILITOT 1.1 05/31/2023 1358   BILITOT 0.8 05/29/2023 0846       RADIOGRAPHIC STUDIES: No results found.  ASSESSMENT AND PLAN: This is a very pleasant 78 years old white female with history of microcytic anemia secondary to iron  deficiency from an adequate oral intake.  The patient has intolerance to the oral iron tablets. She was treated in the past with iron infusion with Venofer 300 Mg IV weekly for 3 weeks during the last 3 weeks  of March 2025.  Iron deficiency anemia Iron deficiency anemia post-treatment with Venofer iron infusions and one blood transfusion. Hemoglobin improved from 7.0 g/dL in February to 24.4 g/dL currently, with a recent lab showing 13.4 g/dL. Iron levels are normal with iron at 62 mcg/dL and iron saturation at 14%. Dizziness and increased heart rate upon standing may be related to blood pressure changes, possibly exacerbated by recent discontinuation of amlodipine. If hemoglobin and iron levels remain stable, further treatment may not be necessary. However, if levels drop, additional iron infusions may be required. - Monitor hemoglobin and iron studies regularly. - Instruct her to contact the office if symptoms worsen or if there are any concerns. - Coordinate with primary care and cardiologist to ensure regular monitoring of hemoglobin and iron levels. - Advise her to take positional changes slowly to mitigate dizziness.  Goals of Care and Follow-up Her advanced directives have been scanned into the system. She prefers to be seen as needed due to multiple ongoing appointments with other specialists. Coordination with primary care and cardiologist for regular monitoring of hemoglobin and iron levels is essential to prevent recurrence of severe anemia. - Schedule follow-up in three months for blood work if not monitored by other providers. - Ensure primary care or cardiologist checks hemoglobin and iron studies regularly. - Instruct her to have primary care or cardiologist contact hematology if lab results indicate low levels.   Her preference is not to schedule regular appointment with hematology but see us  on as-needed basis based on her lab results with her primary care physician or cardiologist.   The patient mentioned that she has too many doctors and she would like to see us  only as needed. She was advised to call if she has any concerning symptoms in the interval.     The patient voices understanding of current disease status and treatment options and is in agreement with the current care plan.  All questions were answered. The patient knows to call the clinic with any problems, questions or concerns. We can certainly see the patient much sooner if necessary.   Disclaimer: This note was dictated with voice recognition software. Similar sounding words can inadvertently be transcribed and may not be corrected upon review.

## 2023-08-01 ENCOUNTER — Other Ambulatory Visit (HOSPITAL_BASED_OUTPATIENT_CLINIC_OR_DEPARTMENT_OTHER): Payer: Self-pay

## 2023-08-04 ENCOUNTER — Encounter: Payer: Self-pay | Admitting: Family Medicine

## 2023-08-06 ENCOUNTER — Telehealth: Payer: Self-pay

## 2023-08-06 ENCOUNTER — Encounter: Payer: Self-pay | Admitting: Physician Assistant

## 2023-08-06 ENCOUNTER — Other Ambulatory Visit (HOSPITAL_BASED_OUTPATIENT_CLINIC_OR_DEPARTMENT_OTHER): Payer: Self-pay

## 2023-08-06 MED ORDER — OMEPRAZOLE 20 MG PO CPDR
40.0000 mg | DELAYED_RELEASE_CAPSULE | Freq: Every day | ORAL | 0 refills | Status: DC
Start: 2023-08-06 — End: 2023-10-28
  Filled 2023-08-06: qty 180, 90d supply, fill #0

## 2023-08-06 NOTE — Telephone Encounter (Signed)
 The patient agreed to LAAO on 11/13/2023. Will call her closer to that time to arrange labs and review instructions. She was grateful for call and agreed with plan.

## 2023-08-07 DIAGNOSIS — G4733 Obstructive sleep apnea (adult) (pediatric): Secondary | ICD-10-CM | POA: Diagnosis not present

## 2023-08-08 ENCOUNTER — Telehealth (HOSPITAL_COMMUNITY): Payer: Self-pay

## 2023-08-08 NOTE — Telephone Encounter (Signed)
 Patient called stating she went into Afib and she haven't flip back into rhythm. Spoke with Sam Creighton he stated as long as she have enough blood pressure he wants her to increase her Carvedilol  25mg  twice daily. Give us  a call Monday/Tuesday let us  know how she feels.

## 2023-08-13 ENCOUNTER — Other Ambulatory Visit: Payer: Self-pay

## 2023-08-13 DIAGNOSIS — I4819 Other persistent atrial fibrillation: Secondary | ICD-10-CM

## 2023-08-14 ENCOUNTER — Encounter (HOSPITAL_COMMUNITY): Payer: Self-pay | Admitting: Physician Assistant

## 2023-08-14 ENCOUNTER — Ambulatory Visit (HOSPITAL_COMMUNITY)
Admission: RE | Admit: 2023-08-14 | Discharge: 2023-08-14 | Disposition: A | Discharge status: Home / Self Care | Source: Ambulatory Visit | Attending: Physician Assistant | Admitting: Physician Assistant

## 2023-08-14 VITALS — BP 150/82 | HR 88 | Ht 65.0 in | Wt 225.0 lb

## 2023-08-14 DIAGNOSIS — Z79899 Other long term (current) drug therapy: Secondary | ICD-10-CM | POA: Diagnosis not present

## 2023-08-14 DIAGNOSIS — Z5181 Encounter for therapeutic drug level monitoring: Secondary | ICD-10-CM | POA: Diagnosis not present

## 2023-08-14 DIAGNOSIS — I4819 Other persistent atrial fibrillation: Secondary | ICD-10-CM

## 2023-08-14 DIAGNOSIS — D6869 Other thrombophilia: Secondary | ICD-10-CM

## 2023-08-14 NOTE — Progress Notes (Addendum)
 Primary Care Physician: Ransom Byers, MD Primary Cardiologist: Dr Alvis Ba Primary Electrophysiologist: none Referring Physician: HeartCare triage   Leslie [name] is a 78 y.o. female with a history of CKD, HLD, HTN, hypothyroidism, mild-mod MR, mod AI, OSA, and atrial fibrillation who presents for consultation in the [facility].  The patient was initially diagnosed with atrial fibrillation 07/2016 and had DCCV at that time. Beta-blocker dose was decreased in May 2018 when she had junctional rhythm.  After she received her second dose of coronavirus vaccine in February 2021 she developed symptomatic persistent atrial fibrillation with RVR, again challenging to rate control and underwent cardioversion on June 24, 2019. Patient is on Eliquis  for stroke prevention.   She went back  into the afib 04/29/20  with RVR. She called into the answering service and spoke to [name], [name]. He increased her dose of metoprolol  to 100 mg in  the pm (usual dose  100 mg  in am and 50 mg in pm) Her v range at home is 90's to 120. She feels she went into afib with worries over a sick cat and the weather. She  does feel fatigue and shortness of breath with activity in afib. She is compliant with CPAP, minimal caffeine, no alcohol, no tobacco. No missed anticoagulation. Has had all vaccines.  F/u successful cardioversion, 05/09/20. unfortunately she is back in afib with RVR. Dicussed antiarrythmic's with pt and with Dr. Tita Form. We discussed use of flecainide  as she does not want to use amiodarone  with her known thyroid  disease. She  will need a stress test which Dr. Tita Form will coordinate and then will get loaded on flecainide  with a low risk test and try to get cardioverted in a timely manner. Pt is in agreement. No missed anticoagulation. No conduction issues on ekg's in SR. EF was normal on latest echo.   F/u in afib clinic, 12/07/20, for f/u from Dr. Tita Form for flecainide  surveillance. She has  been staying in SR on flecainide . She has a h/o of migraines and feel that they may have increased around the time of starting flecainide  but cannot say for sure this is what is triggering them again. She was having some bradycardia buyt her drugs was tweaked by Dr. Tita Form in May  and this has improved.   Follow up in the AF clinic 07/26/22.  She presented to the ED on 04/09/2022 with shortness of breath, hypertensive urgency with SBP >200.  Echocardiogram showed EF 25 to 30%, severely decreased LV function, RWMA, mild LVH, normal RV systolic function, small pericardial effusion, moderate mitral valve regurgitation, moderate aortic valve regurgitation.  Troponin was elevated.  Cardiology was consulted and she underwent R/LHC on 04/11/2022 which revealed mild pulmonary hypertension, mild nonobstructive CAD, and ICM. Flecainide  and verapamil  were transitioned to amiodarone  and metoprolol  in the setting of reduced EF.  It was thought that her acute systolic heart failure was secondary to Takotsubo/stress-induced cardiomyopathy/type II MI in the setting of hypertensive urgency.  She was discharged home in stable condition on 04/12/2022. Echo 05/22/22 showed her EF returned to normal. This morning, patient had sudden onset tachypalpitations, her heart rates were up to 140s bpm. She called the clinic to make an appointment and took her morning medications. She converted to SR en route. There were no specific triggers that she could identify.   Follow up in the AF clinic 01/22/23. Patient reports that she has noticed "high heart rate" notifications on her smart watch. These  can be associated with high BP readings as well. She does have palpitations. The ECG strips from her watch show sinus tachycardia with rates in the low 100's. No bleeding issues on anticoagulation.   Follow up 08/14/23. Patient was seen in the ED 05/31/23 with symptomatic afib and underwent DCCV at that time. She was also worked up for significant anemia  requiring transfusion, no source found. Seen by Dr Marven Slimmer and is scheduled for Watchman implant 10/2023. Patient reports that she went back into afib last Friday with symptoms of SOB on exertion and elevated heart rates. There were no specific triggers that she could identify.   Today, she  denies symptoms of palpitations, chest pain, orthopnea, PND, lower extremity edema, dizziness, presyncope, syncope, bleeding, or neurologic sequela. The patient is tolerating medications without difficulties and is otherwise without complaint today.    Atrial Fibrillation Risk Factors:  she does have symptoms or diagnosis of sleep apnea. she does not have a history of rheumatic fever. she does not have a history of alcohol use. The patient does not have a history of early familial atrial fibrillation or other arrhythmias.   Atrial Fibrillation Management history:  Previous antiarrhythmic drugs: flecainide , amiodarone   Previous cardioversions: 08/01/2016, 06/24/19 Previous ablations: none Anticoagulation history: Eliquis    Past Medical History:  Diagnosis Date   Adenomatous polyp    Arthritis    "knees, legs, fingers" (07/30/2016)   Asthma    Bursitis of left shoulder    "just finished PT" (07/30/2016)   Chest pain    a. 2003 Abnl stress test-->Cath: nonobs CAD.   CHF (congestive heart failure) (HCC)    Chronic bronchitis (HCC)    Chronic kidney disease    stage 3   GERD (gastroesophageal reflux disease)    barrett's esophagus- Dr. Dellis Fermo   History of hiatal hernia    Hyperlipidemia    Hypertension    Hypothyroidism    Migraines    "sporatic; at least a few/year" (07/30/2016)   Mitral regurgitation    a. 07/2016 Echo: mild to mod MR;  b. 07/2016 TEE: mild MR.   Moderate aortic insufficiency    a. 07/2016 Echo: EF 55-60%, mod AI;  b. 07/2016 TEE: EF 50-55%, mod AI.   Obesity    s/p lap band surgery 6/10   OSA on CPAP    PAF (paroxysmal atrial fibrillation) (HCC)    a. 07/2016 TEE/DCCV: EF  50-55%, mild MR, mod AI, mild to mod TR, neg bubble study-->successful DCCV x1 w/ 120J.   PONV (postoperative nausea and vomiting)    Vitamin D deficiency     Current Outpatient Medications  Medication Sig Dispense Refill   acetaminophen  (TYLENOL ) 650 MG CR tablet Take 1,300 mg by mouth at bedtime as needed for pain.     ALPRAZolam  (XANAX ) 0.25 MG tablet Take 1 tablet (0.25 mg total) by mouth as needed for anxiety (take 1 to 2 tablets for each of your upcoming flights). 4 tablet 0   amiodarone  (PACERONE ) 200 MG tablet Take 1 tablet (200 mg total) by mouth daily. 90 tablet 3   apixaban  (ELIQUIS ) 5 MG TABS tablet Take 1 tablet (5 mg total) by mouth 2 (two) times daily. 180 tablet 1   ARNUITY ELLIPTA  50 MCG/ACT AEPB Inhale 1 puff into the lungs at bedtime.     atorvastatin  (LIPITOR) 40 MG tablet Take 1 tablet (40 mg total) by mouth daily. 90 tablet 3   atorvastatin  (LIPITOR) 40 MG tablet Take 1 tablet (40 mg total)  by mouth in the morning. 90 tablet 3   Bacillus Coagulans-Inulin (ALIGN PREBIOTIC-PROBIOTIC PO) Taking 2 Gummies by mouth daily     carvedilol  (COREG ) 12.5 MG tablet Take 2 tablets (25 mg total) by mouth every morning AND 1 tablet (12.5 mg total) at bedtime. 270 tablet 3   cholecalciferol (VITAMIN D) 1000 UNITS tablet Take 1,000 Units by mouth daily.     diclofenac Sodium (VOLTAREN) 1 % GEL Apply 1 application topically 2 (two) times daily.     fexofenadine (ALLEGRA) 180 MG tablet Take 180 mg by mouth daily.     fluticasone  (FLONASE ) 50 MCG/ACT nasal spray Place 2 sprays into both nostrils at bedtime.      Fluticasone  Furoate (ARNUITY ELLIPTA ) 50 MCG/ACT AEPB Inhale 1 puff by mouth daily. 90 each 3   furosemide  (LASIX ) 20 MG tablet Take 1 tablet (20 mg) daily. May take an additional 1 tablet (20 mg) on the days of increase swelling, weight gain of 3 lb overnight or 5 lb in 1 week. 180 tablet 3   Glucosamine-Chondroitin (OSTEO BI-FLEX REGULAR STRENGTH PO) Take 1 tablet by mouth daily.      levothyroxine  (SYNTHROID ) 75 MCG tablet Take 75 mcg by mouth every morning.     levothyroxine  (SYNTHROID ) 75 MCG tablet Take 1 tablet (75 mcg total) by mouth every morning on an empty stomach. 90 tablet 0   magnesium  oxide (MAG-OX) 400 (240 Mg) MG tablet Take 1 tablet (400 mg total) by mouth daily. 120 tablet 1   metroNIDAZOLE (METROCREAM) 0.75 % cream Apply 1 application  topically daily.     mometasone (ELOCON) 0.1 % cream Apply 1 application  topically 2 (two) times daily as needed (skin irritation).     Multiple Vitamins-Minerals (CENTRUM MULTIGUMMIES) CHEW Takes 1 gummy by mouth daily     omeprazole  (PRILOSEC) 20 MG capsule Take 40 mg by mouth daily before breakfast.     omeprazole  (PRILOSEC) 20 MG capsule Take 2 capsules (40 mg total) by mouth daily. 180 capsule 0   potassium chloride  SA (KLOR-CON  M) 20 MEQ tablet Take 1 tablet (20 mEq total) by mouth 2 (two) times daily. 60 tablet 2   predniSONE  (DELTASONE ) 20 MG tablet Take 20 mg a day for 3 days 3 tablet 0   sertraline  (ZOLOFT ) 25 MG tablet Take 1 tablet (25 mg total) by mouth daily. 90 tablet 3   amLODipine  (NORVASC ) 5 MG tablet Take 1 tablet (5 mg total) by mouth daily. (Patient not taking: Reported on 08/14/2023) 90 tablet 3   No current facility-administered medications for this encounter.    ROS- All systems are reviewed and negative except as per the HPI above.  Physical Exam: Vitals:   08/14/23 1347  BP: (!) 150/82  Pulse: 88  Weight: 102.1 kg  Height: 5\' 5"  (1.651 m)    GEN: Well nourished, well developed in no acute distress NECK: No JVD CARDIAC: Irregularly irregular rate and rhythm, no murmurs, rubs, gallops RESPIRATORY:  Clear to auscultation without rales, wheezing or rhonchi  ABDOMEN: Soft, non-tender, non-distended EXTREMITIES:  No edema; No deformity    Wt Readings from Last 3 Encounters:  08/14/23 102.1 kg  07/31/23 101.4 kg  07/18/23 101.7 kg    EKG today demonstrates  Atypical atrial flutter with  variable block vs coarse afib Vent. rate 88 BPM PR interval * ms QRS duration 86 ms QT/QTcB 374/452 ms   Echo 06/23/23 demonstrated   1. Left ventricular ejection fraction, by estimation, is 55 to 60%.  Left  ventricular ejection fraction by 3D volume is 57 %. The left ventricle has  normal function. The left ventricle has no regional wall motion  abnormalities. Left ventricular diastolic   parameters are consistent with Grade II diastolic dysfunction  (pseudonormalization).   2. Right ventricular systolic function is normal. The right ventricular  size is normal. There is normal pulmonary artery systolic pressure. The  estimated right ventricular systolic pressure is 21.7 mmHg.   3. Left atrial size was severely dilated.   4. Right atrial size was severely dilated.   5. The mitral valve is abnormal. Severe mitral valve regurgitation. No  evidence of mitral stenosis.   6. Tricuspid valve regurgitation is moderate.   7. The aortic valve is tricuspid. Aortic valve regurgitation is mild to  moderate. Aortic valve sclerosis is present, with no evidence of aortic  valve stenosis.   8. The inferior vena cava is normal in size with greater than 50%  respiratory variability, suggesting right atrial pressure of 3 mmHg.   Comparison(s): Prior images reviewed side by side. The left ventricular  function is unchanged. Mitral insufficiency appears worse.   Epic records are reviewed at length today  CHA2DS2-VASc Score = 5  The patient's score is based upon: CHF History: 1 HTN History: 1 Diabetes History: 0 Stroke History: 0 Vascular Disease History: 0 Age Score: 2 Gender Score: 1       ASSESSMENT AND PLAN: Persistent Atrial Fibrillation (ICD10:  I48.19) The patient's CHA2DS2-VASc score is 5, indicating a 7.2% annual risk of stroke.   Patient in persistent afib We discussed rhythm control options. Short term, will plan for DCCV. Recent labs reviewed.  Long term, question if she would  be a candidate for ablation prior to [facility] implant, will discuss with Dr Marven Slimmer. However, rhythm control may be more difficult with possible severe MR.  Continue amiodarone  200 mg daily Continue Eliquis  5 mg BID Continue carvedilol  25 mg BID (taking double of 12.5 mg)  Secondary Hypercoagulable State (ICD10:  D68.69) The patient is at significant risk for stroke/thromboembolism based upon her CHA2DS2-VASc Score of 5.  Continue Apixaban  (Eliquis ). Scheduled for Watchman 11/13/23.  High Risk Medication Monitoring (ICD 10: Z79.899) Intervals on ECG acceptable for amiodarone  monitoring.   Obesity Body mass index is 37.44 kg/m.  Encouraged lifestyle modification S/p lab band 2010   OSA  Encouraged nightly CPAP  HTN Elevated today, will reassess in SR.  Stress cardiomyopathy EF normalized, 55-60% on most recent echo GDMT per primary cardiology team Fluid status appears stable today  VHD Severe MR, plan for TEE at time of Watchman.  Followed by Dr Alvis Ba ? If this is contributing to her arrhythmia persistence.    Follow up with Dr Alvis Ba as scheduled.    Informed Consent   Shared Decision Making/Informed Consent The risks (stroke, cardiac arrhythmias rarely resulting in the need for a temporary or permanent pacemaker, skin irritation or burns and complications associated with conscious sedation including aspiration, arrhythmia, respiratory failure and death), benefits (restoration of normal sinus rhythm) and alternatives of a direct current cardioversion were explained in detail to Ms. Norland and she agrees to proceed.        Myrtha Ates PA-C Afib Clinic [facility] [address] [place], [place] [address] [phone]

## 2023-08-14 NOTE — H&P (View-Only) (Signed)
 Primary Care Physician: Ransom Byers, MD Primary Cardiologist: Dr Alvis Ba Primary Electrophysiologist: none Referring Physician: HeartCare triage   Leslie [name] is a 78 y.o. female with a history of CKD, HLD, HTN, hypothyroidism, mild-mod MR, mod AI, OSA, and atrial fibrillation who presents for consultation in the [facility].  The patient was initially diagnosed with atrial fibrillation 07/2016 and had DCCV at that time. Beta-blocker dose was decreased in May 2018 when she had junctional rhythm.  After she received her second dose of coronavirus vaccine in February 2021 she developed symptomatic persistent atrial fibrillation with RVR, again challenging to rate control and underwent cardioversion on June 24, 2019. Patient is on Eliquis  for stroke prevention.   She went back  into the afib 04/29/20  with RVR. She called into the answering service and spoke to [name], [name]. He increased her dose of metoprolol  to 100 mg in  the pm (usual dose  100 mg  in am and 50 mg in pm) Her v range at home is 90's to 120. She feels she went into afib with worries over a sick cat and the weather. She  does feel fatigue and shortness of breath with activity in afib. She is compliant with CPAP, minimal caffeine, no alcohol, no tobacco. No missed anticoagulation. Has had all vaccines.  F/u successful cardioversion, 05/09/20. unfortunately she is back in afib with RVR. Dicussed antiarrythmic's with pt and with Dr. Tita Form. We discussed use of flecainide  as she does not want to use amiodarone  with her known thyroid  disease. She  will need a stress test which Dr. Tita Form will coordinate and then will get loaded on flecainide  with a low risk test and try to get cardioverted in a timely manner. Pt is in agreement. No missed anticoagulation. No conduction issues on ekg's in SR. EF was normal on latest echo.   F/u in afib clinic, 12/07/20, for f/u from Dr. Tita Form for flecainide  surveillance. She has  been staying in SR on flecainide . She has a h/o of migraines and feel that they may have increased around the time of starting flecainide  but cannot say for sure this is what is triggering them again. She was having some bradycardia buyt her drugs was tweaked by Dr. Tita Form in May  and this has improved.   Follow up in the AF clinic 07/26/22.  She presented to the ED on 04/09/2022 with shortness of breath, hypertensive urgency with SBP >200.  Echocardiogram showed EF 25 to 30%, severely decreased LV function, RWMA, mild LVH, normal RV systolic function, small pericardial effusion, moderate mitral valve regurgitation, moderate aortic valve regurgitation.  Troponin was elevated.  Cardiology was consulted and she underwent R/LHC on 04/11/2022 which revealed mild pulmonary hypertension, mild nonobstructive CAD, and ICM. Flecainide  and verapamil  were transitioned to amiodarone  and metoprolol  in the setting of reduced EF.  It was thought that her acute systolic heart failure was secondary to Takotsubo/stress-induced cardiomyopathy/type II MI in the setting of hypertensive urgency.  She was discharged home in stable condition on 04/12/2022. Echo 05/22/22 showed her EF returned to normal. This morning, patient had sudden onset tachypalpitations, her heart rates were up to 140s bpm. She called the clinic to make an appointment and took her morning medications. She converted to SR en route. There were no specific triggers that she could identify.   Follow up in the AF clinic 01/22/23. Patient reports that she has noticed "high heart rate" notifications on her smart watch. These  can be associated with high BP readings as well. She does have palpitations. The ECG strips from her watch show sinus tachycardia with rates in the low 100's. No bleeding issues on anticoagulation.   Follow up 08/14/23. Patient was seen in the ED 05/31/23 with symptomatic afib and underwent DCCV at that time. She was also worked up for significant anemia  requiring transfusion, no source found. Seen by Dr Marven Slimmer and is scheduled for Watchman implant 10/2023. Patient reports that she went back into afib last Friday with symptoms of SOB on exertion and elevated heart rates. There were no specific triggers that she could identify.   Today, she  denies symptoms of palpitations, chest pain, orthopnea, PND, lower extremity edema, dizziness, presyncope, syncope, bleeding, or neurologic sequela. The patient is tolerating medications without difficulties and is otherwise without complaint today.    Atrial Fibrillation Risk Factors:  she does have symptoms or diagnosis of sleep apnea. she does not have a history of rheumatic fever. she does not have a history of alcohol use. The patient does not have a history of early familial atrial fibrillation or other arrhythmias.   Atrial Fibrillation Management history:  Previous antiarrhythmic drugs: flecainide , amiodarone   Previous cardioversions: 08/01/2016, 06/24/19 Previous ablations: none Anticoagulation history: Eliquis    Past Medical History:  Diagnosis Date   Adenomatous polyp    Arthritis    "knees, legs, fingers" (07/30/2016)   Asthma    Bursitis of left shoulder    "just finished PT" (07/30/2016)   Chest pain    a. 2003 Abnl stress test-->Cath: nonobs CAD.   CHF (congestive heart failure) (HCC)    Chronic bronchitis (HCC)    Chronic kidney disease    stage 3   GERD (gastroesophageal reflux disease)    barrett's esophagus- Dr. Dellis Fermo   History of hiatal hernia    Hyperlipidemia    Hypertension    Hypothyroidism    Migraines    "sporatic; at least a few/year" (07/30/2016)   Mitral regurgitation    a. 07/2016 Echo: mild to mod MR;  b. 07/2016 TEE: mild MR.   Moderate aortic insufficiency    a. 07/2016 Echo: EF 55-60%, mod AI;  b. 07/2016 TEE: EF 50-55%, mod AI.   Obesity    s/p lap band surgery 6/10   OSA on CPAP    PAF (paroxysmal atrial fibrillation) (HCC)    a. 07/2016 TEE/DCCV: EF  50-55%, mild MR, mod AI, mild to mod TR, neg bubble study-->successful DCCV x1 w/ 120J.   PONV (postoperative nausea and vomiting)    Vitamin D deficiency     Current Outpatient Medications  Medication Sig Dispense Refill   acetaminophen  (TYLENOL ) 650 MG CR tablet Take 1,300 mg by mouth at bedtime as needed for pain.     ALPRAZolam  (XANAX ) 0.25 MG tablet Take 1 tablet (0.25 mg total) by mouth as needed for anxiety (take 1 to 2 tablets for each of your upcoming flights). 4 tablet 0   amiodarone  (PACERONE ) 200 MG tablet Take 1 tablet (200 mg total) by mouth daily. 90 tablet 3   apixaban  (ELIQUIS ) 5 MG TABS tablet Take 1 tablet (5 mg total) by mouth 2 (two) times daily. 180 tablet 1   ARNUITY ELLIPTA  50 MCG/ACT AEPB Inhale 1 puff into the lungs at bedtime.     atorvastatin  (LIPITOR) 40 MG tablet Take 1 tablet (40 mg total) by mouth daily. 90 tablet 3   atorvastatin  (LIPITOR) 40 MG tablet Take 1 tablet (40 mg total)  by mouth in the morning. 90 tablet 3   Bacillus Coagulans-Inulin (ALIGN PREBIOTIC-PROBIOTIC PO) Taking 2 Gummies by mouth daily     carvedilol  (COREG ) 12.5 MG tablet Take 2 tablets (25 mg total) by mouth every morning AND 1 tablet (12.5 mg total) at bedtime. 270 tablet 3   cholecalciferol (VITAMIN D) 1000 UNITS tablet Take 1,000 Units by mouth daily.     diclofenac Sodium (VOLTAREN) 1 % GEL Apply 1 application topically 2 (two) times daily.     fexofenadine (ALLEGRA) 180 MG tablet Take 180 mg by mouth daily.     fluticasone  (FLONASE ) 50 MCG/ACT nasal spray Place 2 sprays into both nostrils at bedtime.      Fluticasone  Furoate (ARNUITY ELLIPTA ) 50 MCG/ACT AEPB Inhale 1 puff by mouth daily. 90 each 3   furosemide  (LASIX ) 20 MG tablet Take 1 tablet (20 mg) daily. May take an additional 1 tablet (20 mg) on the days of increase swelling, weight gain of 3 lb overnight or 5 lb in 1 week. 180 tablet 3   Glucosamine-Chondroitin (OSTEO BI-FLEX REGULAR STRENGTH PO) Take 1 tablet by mouth daily.      levothyroxine  (SYNTHROID ) 75 MCG tablet Take 75 mcg by mouth every morning.     levothyroxine  (SYNTHROID ) 75 MCG tablet Take 1 tablet (75 mcg total) by mouth every morning on an empty stomach. 90 tablet 0   magnesium  oxide (MAG-OX) 400 (240 Mg) MG tablet Take 1 tablet (400 mg total) by mouth daily. 120 tablet 1   metroNIDAZOLE (METROCREAM) 0.75 % cream Apply 1 application  topically daily.     mometasone (ELOCON) 0.1 % cream Apply 1 application  topically 2 (two) times daily as needed (skin irritation).     Multiple Vitamins-Minerals (CENTRUM MULTIGUMMIES) CHEW Takes 1 gummy by mouth daily     omeprazole  (PRILOSEC) 20 MG capsule Take 40 mg by mouth daily before breakfast.     omeprazole  (PRILOSEC) 20 MG capsule Take 2 capsules (40 mg total) by mouth daily. 180 capsule 0   potassium chloride  SA (KLOR-CON  M) 20 MEQ tablet Take 1 tablet (20 mEq total) by mouth 2 (two) times daily. 60 tablet 2   predniSONE  (DELTASONE ) 20 MG tablet Take 20 mg a day for 3 days 3 tablet 0   sertraline  (ZOLOFT ) 25 MG tablet Take 1 tablet (25 mg total) by mouth daily. 90 tablet 3   amLODipine  (NORVASC ) 5 MG tablet Take 1 tablet (5 mg total) by mouth daily. (Patient not taking: Reported on 08/14/2023) 90 tablet 3   No current facility-administered medications for this encounter.    ROS- All systems are reviewed and negative except as per the HPI above.  Physical Exam: Vitals:   08/14/23 1347  BP: (!) 150/82  Pulse: 88  Weight: 102.1 kg  Height: 5\' 5"  (1.651 m)    GEN: Well nourished, well developed in no acute distress NECK: No JVD CARDIAC: Irregularly irregular rate and rhythm, no murmurs, rubs, gallops RESPIRATORY:  Clear to auscultation without rales, wheezing or rhonchi  ABDOMEN: Soft, non-tender, non-distended EXTREMITIES:  No edema; No deformity    Wt Readings from Last 3 Encounters:  08/14/23 102.1 kg  07/31/23 101.4 kg  07/18/23 101.7 kg    EKG today demonstrates  Atypical atrial flutter with  variable block vs coarse afib Vent. rate 88 BPM PR interval * ms QRS duration 86 ms QT/QTcB 374/452 ms   Echo 06/23/23 demonstrated   1. Left ventricular ejection fraction, by estimation, is 55 to 60%.  Left  ventricular ejection fraction by 3D volume is 57 %. The left ventricle has  normal function. The left ventricle has no regional wall motion  abnormalities. Left ventricular diastolic   parameters are consistent with Grade II diastolic dysfunction  (pseudonormalization).   2. Right ventricular systolic function is normal. The right ventricular  size is normal. There is normal pulmonary artery systolic pressure. The  estimated right ventricular systolic pressure is 21.7 mmHg.   3. Left atrial size was severely dilated.   4. Right atrial size was severely dilated.   5. The mitral valve is abnormal. Severe mitral valve regurgitation. No  evidence of mitral stenosis.   6. Tricuspid valve regurgitation is moderate.   7. The aortic valve is tricuspid. Aortic valve regurgitation is mild to  moderate. Aortic valve sclerosis is present, with no evidence of aortic  valve stenosis.   8. The inferior vena cava is normal in size with greater than 50%  respiratory variability, suggesting right atrial pressure of 3 mmHg.   Comparison(s): Prior images reviewed side by side. The left ventricular  function is unchanged. Mitral insufficiency appears worse.   Epic records are reviewed at length today  CHA2DS2-VASc Score = 5  The patient's score is based upon: CHF History: 1 HTN History: 1 Diabetes History: 0 Stroke History: 0 Vascular Disease History: 0 Age Score: 2 Gender Score: 1       ASSESSMENT AND PLAN: Persistent Atrial Fibrillation (ICD10:  I48.19) The patient's CHA2DS2-VASc score is 5, indicating a 7.2% annual risk of stroke.   Patient in persistent afib We discussed rhythm control options. Short term, will plan for DCCV. Recent labs reviewed.  Long term, question if she would  be a candidate for ablation prior to [facility] implant, will discuss with Dr Marven Slimmer. However, rhythm control may be more difficult with possible severe MR.  Continue amiodarone  200 mg daily Continue Eliquis  5 mg BID Continue carvedilol  25 mg BID (taking double of 12.5 mg)  Secondary Hypercoagulable State (ICD10:  D68.69) The patient is at significant risk for stroke/thromboembolism based upon her CHA2DS2-VASc Score of 5.  Continue Apixaban  (Eliquis ). Scheduled for Watchman 11/13/23.  High Risk Medication Monitoring (ICD 10: Z79.899) Intervals on ECG acceptable for amiodarone  monitoring.   Obesity Body mass index is 37.44 kg/m.  Encouraged lifestyle modification S/p lab band 2010   OSA  Encouraged nightly CPAP  HTN Elevated today, will reassess in SR.  Stress cardiomyopathy EF normalized, 55-60% on most recent echo GDMT per primary cardiology team Fluid status appears stable today  VHD Severe MR, plan for TEE at time of Watchman.  Followed by Dr Alvis Ba ? If this is contributing to her arrhythmia persistence.    Follow up with Dr Alvis Ba as scheduled.    Informed Consent   Shared Decision Making/Informed Consent The risks (stroke, cardiac arrhythmias rarely resulting in the need for a temporary or permanent pacemaker, skin irritation or burns and complications associated with conscious sedation including aspiration, arrhythmia, respiratory failure and death), benefits (restoration of normal sinus rhythm) and alternatives of a direct current cardioversion were explained in detail to Ms. Norland and she agrees to proceed.        Myrtha Ates PA-C Afib Clinic [facility] [address] [place], [place] [address] [phone]

## 2023-08-14 NOTE — H&P (View-Only) (Signed)
 Primary Care Physician: Leslie Byers, MD Primary Cardiologist: Leslie Leslie [name] Primary Electrophysiologist: none Referring Physician: HeartCare triage   Leslie [name] is a 78 y.o. female with a history of CKD, HLD, HTN, hypothyroidism, mild-mod MR, mod AI, OSA, and atrial fibrillation who presents for consultation in the [facility].  The patient was initially diagnosed with atrial fibrillation 07/2016 and had DCCV at that time. Beta-blocker dose was decreased in May 2018 when she had junctional rhythm.  After she received her second dose of coronavirus vaccine in February 2021 she developed symptomatic persistent atrial fibrillation with RVR, again challenging to rate control and underwent cardioversion on June 24, 2019. Patient is on Eliquis  for stroke prevention.   She went back  into the afib 04/29/20  with RVR. She called into the answering service and spoke to Leslie [name], Leslie [name]. He increased her dose of metoprolol  to 100 mg in  the pm (usual dose  100 mg  in am and 50 mg in pm) Her v range at home is 90's to 120. She feels she went into afib with worries over a sick cat and the weather. She  does feel fatigue and shortness of breath with activity in afib. She is compliant with CPAP, minimal caffeine, no alcohol, no tobacco. No missed anticoagulation. Has had all vaccines.  F/u successful cardioversion, 05/09/20. unfortunately she is back in afib with RVR. Dicussed antiarrythmic's with pt and with Leslie [name]. We discussed use of flecainide  as she does not want to use amiodarone  with her known thyroid  disease. She  will need a stress test which Leslie [name] will coordinate and then will get loaded on flecainide  with a low risk test and try to get cardioverted in a timely manner. Pt is in agreement. No missed anticoagulation. No conduction issues on ekg's in SR. EF was normal on latest echo.   F/u in afib clinic, 12/07/20, for f/u from Leslie [name] for flecainide  surveillance. She has  been staying in SR on flecainide . She has a h/o of migraines and feel that they may have increased around the time of starting flecainide  but cannot say for sure this is what is triggering them again. She was having some bradycardia buyt her drugs was tweaked by Leslie [name] in May  and this has improved.   Follow up in the AF clinic 07/26/22.  She presented to the ED on 04/09/2022 with shortness of breath, hypertensive urgency with SBP >200.  Echocardiogram showed EF 25 to 30%, severely decreased LV function, RWMA, mild LVH, normal RV systolic function, small pericardial effusion, moderate mitral valve regurgitation, moderate aortic valve regurgitation.  Troponin was elevated.  Cardiology was consulted and she underwent R/LHC on 04/11/2022 which revealed mild pulmonary hypertension, mild nonobstructive CAD, and ICM. Flecainide  and verapamil  were transitioned to amiodarone  and metoprolol  in the setting of reduced EF.  It was thought that her acute systolic heart failure was secondary to Takotsubo/stress-induced cardiomyopathy/type II MI in the setting of hypertensive urgency.  She was discharged home in stable condition on 04/12/2022. Echo 05/22/22 showed her EF returned to normal. This morning, patient had sudden onset tachypalpitations, her heart rates were up to 140s bpm. She called the clinic to make an appointment and took her morning medications. She converted to SR en route. There were no specific triggers that she could identify.   Follow up in the AF clinic 01/22/23. Patient reports that she has noticed "high heart rate" notifications on her smart watch. These  can be associated with high BP readings as well. She does have palpitations. The ECG strips from her watch show sinus tachycardia with rates in the low 100's. No bleeding issues on anticoagulation.   Follow up 08/14/23. Patient was seen in the ED 05/31/23 with symptomatic afib and underwent DCCV at that time. She was also worked up for significant anemia  requiring transfusion, no source found. Seen by Leslie [name] and is scheduled for Watchman implant 10/2023. Patient reports that she went back into afib last Friday with symptoms of SOB on exertion and elevated heart rates. There were no specific triggers that she could identify.   Today, she  denies symptoms of palpitations, chest pain, orthopnea, PND, lower extremity edema, dizziness, presyncope, syncope, bleeding, or neurologic sequela. The patient is tolerating medications without difficulties and is otherwise without complaint today.    Atrial Fibrillation Risk Factors:  she does have symptoms or diagnosis of sleep apnea. she does not have a history of rheumatic fever. she does not have a history of alcohol use. The patient does not have a history of early familial atrial fibrillation or other arrhythmias.   Atrial Fibrillation Management history:  Previous antiarrhythmic drugs: flecainide , amiodarone   Previous cardioversions: 08/01/2016, 06/24/19 Previous ablations: none Anticoagulation history: Eliquis    Past Medical History:  Diagnosis Date   Adenomatous polyp    Arthritis    "knees, legs, fingers" (07/30/2016)   Asthma    Bursitis of left shoulder    "just finished PT" (07/30/2016)   Chest pain    a. 2003 Abnl stress test-->Cath: nonobs CAD.   CHF (congestive heart failure) (HCC)    Chronic bronchitis (HCC)    Chronic kidney disease    stage 3   GERD (gastroesophageal reflux disease)    barrett's esophagus- Leslie. Dellis [name]   History of hiatal hernia    Hyperlipidemia    Hypertension    Hypothyroidism    Migraines    "sporatic; at least a few/year" (07/30/2016)   Mitral regurgitation    a. 07/2016 Echo: mild to mod MR;  b. 07/2016 TEE: mild MR.   Moderate aortic insufficiency    a. 07/2016 Echo: EF 55-60%, mod AI;  b. 07/2016 TEE: EF 50-55%, mod AI.   Obesity    s/p lap band surgery 6/10   OSA on CPAP    PAF (paroxysmal atrial fibrillation) (HCC)    a. 07/2016 TEE/DCCV: EF  50-55%, mild MR, mod AI, mild to mod TR, neg bubble study-->successful DCCV x1 w/ 120J.   PONV (postoperative nausea and vomiting)    Vitamin D deficiency     Current Outpatient Medications  Medication Sig Dispense Refill   acetaminophen  (TYLENOL ) 650 MG CR tablet Take 1,300 mg by mouth at bedtime as needed for pain.     ALPRAZolam  (XANAX ) 0.25 MG tablet Take 1 tablet (0.25 mg total) by mouth as needed for anxiety (take 1 to 2 tablets for each of your upcoming flights). 4 tablet 0   amiodarone  (PACERONE ) 200 MG tablet Take 1 tablet (200 mg total) by mouth daily. 90 tablet 3   apixaban  (ELIQUIS ) 5 MG TABS tablet Take 1 tablet (5 mg total) by mouth 2 (two) times daily. 180 tablet 1   ARNUITY ELLIPTA  50 MCG/ACT AEPB Inhale 1 puff into the lungs at bedtime.     atorvastatin  (LIPITOR) 40 MG tablet Take 1 tablet (40 mg total) by mouth daily. 90 tablet 3   atorvastatin  (LIPITOR) 40 MG tablet Take 1 tablet (40 mg total)  by mouth in the morning. 90 tablet 3   Bacillus Coagulans-Inulin (ALIGN PREBIOTIC-PROBIOTIC PO) Taking 2 Gummies by mouth daily     carvedilol  (COREG ) 12.5 MG tablet Take 2 tablets (25 mg total) by mouth every morning AND 1 tablet (12.5 mg total) at bedtime. 270 tablet 3   cholecalciferol (VITAMIN D) 1000 UNITS tablet Take 1,000 Units by mouth daily.     diclofenac Sodium (VOLTAREN) 1 % GEL Apply 1 application topically 2 (two) times daily.     fexofenadine (ALLEGRA) 180 MG tablet Take 180 mg by mouth daily.     fluticasone  (FLONASE ) 50 MCG/ACT nasal spray Place 2 sprays into both nostrils at bedtime.      Fluticasone  Furoate (ARNUITY ELLIPTA ) 50 MCG/ACT AEPB Inhale 1 puff by mouth daily. 90 each 3   furosemide  (LASIX ) 20 MG tablet Take 1 tablet (20 mg) daily. May take an additional 1 tablet (20 mg) on the days of increase swelling, weight gain of 3 lb overnight or 5 lb in 1 week. 180 tablet 3   Glucosamine-Chondroitin (OSTEO BI-FLEX REGULAR STRENGTH PO) Take 1 tablet by mouth daily.      levothyroxine  (SYNTHROID ) 75 MCG tablet Take 75 mcg by mouth every morning.     levothyroxine  (SYNTHROID ) 75 MCG tablet Take 1 tablet (75 mcg total) by mouth every morning on an empty stomach. 90 tablet 0   magnesium  oxide (MAG-OX) 400 (240 Mg) MG tablet Take 1 tablet (400 mg total) by mouth daily. 120 tablet 1   metroNIDAZOLE (METROCREAM) 0.75 % cream Apply 1 application  topically daily.     mometasone (ELOCON) 0.1 % cream Apply 1 application  topically 2 (two) times daily as needed (skin irritation).     Multiple Vitamins-Minerals (CENTRUM MULTIGUMMIES) CHEW Takes 1 gummy by mouth daily     omeprazole  (PRILOSEC) 20 MG capsule Take 40 mg by mouth daily before breakfast.     omeprazole  (PRILOSEC) 20 MG capsule Take 2 capsules (40 mg total) by mouth daily. 180 capsule 0   potassium chloride  SA (KLOR-CON  M) 20 MEQ tablet Take 1 tablet (20 mEq total) by mouth 2 (two) times daily. 60 tablet 2   predniSONE  (DELTASONE ) 20 MG tablet Take 20 mg a day for 3 days 3 tablet 0   sertraline  (ZOLOFT ) 25 MG tablet Take 1 tablet (25 mg total) by mouth daily. 90 tablet 3   amLODipine  (NORVASC ) 5 MG tablet Take 1 tablet (5 mg total) by mouth daily. (Patient not taking: Reported on 08/14/2023) 90 tablet 3   No current facility-administered medications for this encounter.    ROS- All systems are reviewed and negative except as per the HPI above.  Physical Exam: Vitals:   08/14/23 1347  BP: (!) 150/82  Pulse: 88  Weight: 102.1 kg  Height: 5\' 5"  (1.651 m)    GEN: Well nourished, well developed in no acute distress NECK: No JVD CARDIAC: Irregularly irregular rate and rhythm, no murmurs, rubs, gallops RESPIRATORY:  Clear to auscultation without rales, wheezing or rhonchi  ABDOMEN: Soft, non-tender, non-distended EXTREMITIES:  No edema; No deformity    Wt Readings from Last 3 Encounters:  08/14/23 102.1 kg  07/31/23 101.4 kg  07/18/23 101.7 kg    EKG today demonstrates  Atypical atrial flutter with  variable block vs coarse afib Vent. rate 88 BPM PR interval * ms QRS duration 86 ms QT/QTcB 374/452 ms   Echo 06/23/23 demonstrated   1. Left ventricular ejection fraction, by estimation, is 55 to 60%.  Left  ventricular ejection fraction by 3D volume is 57 %. The left ventricle has  normal function. The left ventricle has no regional wall motion  abnormalities. Left ventricular diastolic   parameters are consistent with Grade II diastolic dysfunction  (pseudonormalization).   2. Right ventricular systolic function is normal. The right ventricular  size is normal. There is normal pulmonary artery systolic pressure. The  estimated right ventricular systolic pressure is 21.7 mmHg.   3. Left atrial size was severely dilated.   4. Right atrial size was severely dilated.   5. The mitral valve is abnormal. Severe mitral valve regurgitation. No  evidence of mitral stenosis.   6. Tricuspid valve regurgitation is moderate.   7. The aortic valve is tricuspid. Aortic valve regurgitation is mild to  moderate. Aortic valve sclerosis is present, with no evidence of aortic  valve stenosis.   8. The inferior vena cava is normal in size with greater than 50%  respiratory variability, suggesting right atrial pressure of 3 mmHg.   Comparison(s): Prior images reviewed side by side. The left ventricular  function is unchanged. Mitral insufficiency appears worse.   Epic records are reviewed at length today  CHA2DS2-VASc Score = 5  The patient's score is based upon: CHF History: 1 HTN History: 1 Diabetes History: 0 Stroke History: 0 Vascular Disease History: 0 Age Score: 2 Gender Score: 1       ASSESSMENT AND PLAN: Persistent Atrial Fibrillation (ICD10:  I48.19) The patient's CHA2DS2-VASc score is 5, indicating a 7.2% annual risk of stroke.   Patient in persistent afib We discussed rhythm control options. Short term, will plan for DCCV. Recent labs reviewed.  Long term, question if she would  be a candidate for ablation prior to [facility] implant, will discuss with Leslie [name]. However, rhythm control may be more difficult with possible severe MR.  Continue amiodarone  200 mg daily Continue Eliquis  5 mg BID Continue carvedilol  25 mg BID (taking double of 12.5 mg)  Secondary Hypercoagulable State (ICD10:  D68.69) The patient is at significant risk for stroke/thromboembolism based upon her CHA2DS2-VASc Score of 5.  Continue Apixaban  (Eliquis ). Scheduled for Watchman 11/13/23.  High Risk Medication Monitoring (ICD 10: Z79.899) Intervals on ECG acceptable for amiodarone  monitoring.   Obesity Body mass index is 37.44 kg/m.  Encouraged lifestyle modification S/p lab band 2010   OSA  Encouraged nightly CPAP  HTN Elevated today, will reassess in SR.  Stress cardiomyopathy EF normalized, 55-60% on most recent echo GDMT per primary cardiology team Fluid status appears stable today  VHD Severe MR, plan for TEE at time of Watchman.  Followed by Leslie Leslie [name] ? If this is contributing to her arrhythmia persistence.    Follow up with Leslie Leslie [name] as scheduled.    Informed Consent   Shared Decision Making/Informed Consent The risks (stroke, cardiac arrhythmias rarely resulting in the need for a temporary or permanent pacemaker, skin irritation or burns and complications associated with conscious sedation including aspiration, arrhythmia, respiratory failure and death), benefits (restoration of normal sinus rhythm) and alternatives of a direct current cardioversion were explained in detail to Ms. Norland and she agrees to proceed.        Myrtha Ates PA-C Afib Clinic [facility] [address] [place], [place] [address] [phone]

## 2023-08-14 NOTE — Patient Instructions (Signed)
 Cardioversion scheduled for: Monday., May 5th   - Arrive at the [employer] "A" of [facility] ([address])  and check in with ADMITTING at 10:00AM   - Do not eat or drink anything after midnight the night prior to your procedure.   - Take all your morning medication (except diabetic medications) with a sip of water prior to arrival.  - You will not be able to drive home after your procedure. Please ensure you have a responsible adult to drive you home. You will need someone with you for 24 hours post procedure.    - Do NOT miss any doses of your blood thinner - if you should miss a dose or take a dose more than 4 hours late -- please notify our office immediately.    - Expect to be in the procedural area approximately 2 hours.   - If you feel as if you go back into normal rhythm prior to scheduled cardioversion, please notify our office immediately.   If your procedure is canceled in the cardioversion suite you will be charged a cancellation fee.

## 2023-08-15 NOTE — Progress Notes (Signed)
 Reached voicemail with pt stating her name. Left detailed instructions for her cardioversion Monday May 5th.

## 2023-08-18 ENCOUNTER — Ambulatory Visit (HOSPITAL_COMMUNITY)
Admission: RE | Admit: 2023-08-18 | Discharge: 2023-08-18 | Disposition: A | Discharge status: Home / Self Care | Attending: Internal Medicine | Admitting: Internal Medicine

## 2023-08-18 ENCOUNTER — Encounter (HOSPITAL_COMMUNITY): Payer: Self-pay | Admitting: Internal Medicine

## 2023-08-18 ENCOUNTER — Ambulatory Visit (HOSPITAL_COMMUNITY): Admitting: Anesthesiology

## 2023-08-18 ENCOUNTER — Encounter (HOSPITAL_COMMUNITY)
Admission: RE | Disposition: A | Discharge status: Home / Self Care | Payer: Self-pay | Source: Home / Self Care | Attending: Internal Medicine

## 2023-08-18 ENCOUNTER — Other Ambulatory Visit: Payer: Self-pay

## 2023-08-18 DIAGNOSIS — E785 Hyperlipidemia, unspecified: Secondary | ICD-10-CM | POA: Diagnosis not present

## 2023-08-18 DIAGNOSIS — Z79890 Hormone replacement therapy (postmenopausal): Secondary | ICD-10-CM | POA: Insufficient documentation

## 2023-08-18 DIAGNOSIS — N183 Chronic kidney disease, stage 3 unspecified: Secondary | ICD-10-CM | POA: Insufficient documentation

## 2023-08-18 DIAGNOSIS — E039 Hypothyroidism, unspecified: Secondary | ICD-10-CM | POA: Diagnosis not present

## 2023-08-18 DIAGNOSIS — G4733 Obstructive sleep apnea (adult) (pediatric): Secondary | ICD-10-CM | POA: Insufficient documentation

## 2023-08-18 DIAGNOSIS — K219 Gastro-esophageal reflux disease without esophagitis: Secondary | ICD-10-CM | POA: Insufficient documentation

## 2023-08-18 DIAGNOSIS — I4891 Unspecified atrial fibrillation: Secondary | ICD-10-CM

## 2023-08-18 DIAGNOSIS — Z87891 Personal history of nicotine dependence: Secondary | ICD-10-CM

## 2023-08-18 DIAGNOSIS — E669 Obesity, unspecified: Secondary | ICD-10-CM | POA: Insufficient documentation

## 2023-08-18 DIAGNOSIS — Z7901 Long term (current) use of anticoagulants: Secondary | ICD-10-CM | POA: Insufficient documentation

## 2023-08-18 DIAGNOSIS — I4819 Other persistent atrial fibrillation: Secondary | ICD-10-CM

## 2023-08-18 DIAGNOSIS — Z6837 Body mass index (BMI) 37.0-37.9, adult: Secondary | ICD-10-CM | POA: Insufficient documentation

## 2023-08-18 DIAGNOSIS — I10 Essential (primary) hypertension: Secondary | ICD-10-CM | POA: Diagnosis not present

## 2023-08-18 DIAGNOSIS — D6869 Other thrombophilia: Secondary | ICD-10-CM | POA: Insufficient documentation

## 2023-08-18 DIAGNOSIS — I509 Heart failure, unspecified: Secondary | ICD-10-CM | POA: Insufficient documentation

## 2023-08-18 DIAGNOSIS — I130 Hypertensive heart and chronic kidney disease with heart failure and stage 1 through stage 4 chronic kidney disease, or unspecified chronic kidney disease: Secondary | ICD-10-CM | POA: Insufficient documentation

## 2023-08-18 DIAGNOSIS — I5043 Acute on chronic combined systolic (congestive) and diastolic (congestive) heart failure: Secondary | ICD-10-CM | POA: Diagnosis not present

## 2023-08-18 DIAGNOSIS — I428 Other cardiomyopathies: Secondary | ICD-10-CM | POA: Diagnosis not present

## 2023-08-18 DIAGNOSIS — Z79899 Other long term (current) drug therapy: Secondary | ICD-10-CM | POA: Diagnosis not present

## 2023-08-18 SURGERY — CARDIOVERSION (CATH LAB)
Anesthesia: General

## 2023-08-18 MED ORDER — LIDOCAINE 2% (20 MG/ML) 5 ML SYRINGE
INTRAMUSCULAR | Status: DC | PRN
  Administered 2023-08-18: 20 mg via INTRAVENOUS

## 2023-08-18 MED ORDER — SODIUM CHLORIDE 0.9% FLUSH: 3.0000 mL | Freq: Two times a day (BID) | INTRAVENOUS | Status: DC

## 2023-08-18 MED ORDER — SODIUM CHLORIDE 0.9% FLUSH
3.0000 mL | INTRAVENOUS | Status: DC | PRN
Start: 2023-08-18 — End: 2023-08-18

## 2023-08-18 MED ORDER — PROPOFOL 10 MG/ML IV BOLUS
INTRAVENOUS | Status: DC | PRN
  Administered 2023-08-18: 80 mg via INTRAVENOUS
  Administered 2023-08-18: 20 mg via INTRAVENOUS

## 2023-08-18 SURGICAL SUPPLY — 1 items: PAD DEFIB RADIO PHYSIO CONN (PAD) ×2 IMPLANT

## 2023-08-18 NOTE — Discharge Instructions (Signed)

## 2023-08-18 NOTE — Anesthesia Postprocedure Evaluation (Signed)
 Anesthesia Post Note  Patient: BELLAH SCHOLLENBERGER  Procedure(s) Performed: CARDIOVERSION     Patient location during evaluation: Cath Lab Anesthesia Type: General Level of consciousness: awake and alert, patient cooperative and oriented Pain management: pain level controlled Vital Signs Assessment: post-procedure vital signs reviewed and stable Respiratory status: spontaneous breathing, nonlabored ventilation and respiratory function stable Cardiovascular status: blood pressure returned to baseline and stable Postop Assessment: no apparent nausea or vomiting and able to ambulate Anesthetic complications: no   No notable events documented.  Last Vitals:  Vitals:   08/18/23 1100 08/18/23 1110  BP: (!) 105/59 108/65  Pulse: (!) 51 (!) 53  Resp: 13 12  Temp:    SpO2: 96% 93%    Last Pain:  Vitals:   08/18/23 1056  TempSrc: Temporal  PainSc: Asleep                 Carlisha Wisler,E. Sylvan Sookdeo

## 2023-08-18 NOTE — Anesthesia Preprocedure Evaluation (Addendum)
 Anesthesia Evaluation  Patient identified by MRN, date of birth, ID band Patient awake    Reviewed: Allergy & Precautions, NPO status , Patient's Chart, lab work & pertinent test results, reviewed documented beta blocker date and time   History of Anesthesia Complications (+) PONV  Airway Mallampati: I  TM Distance: >3 FB Neck ROM: Full    Dental  (+) Dental Advisory Given   Pulmonary sleep apnea and Continuous Positive Airway Pressure Ventilation , former smoker   breath sounds clear to auscultation       Cardiovascular hypertension, Pt. on medications and Pt. on home beta blockers (-) angina + dysrhythmias Atrial Fibrillation + Valvular Problems/Murmurs (severe) MR  Rhythm:Irregular Rate:Normal  06/2023 ECHO: EF 55 to 60%. 1. LV EF 57 %. The left ventricle has normal function, no regional wall motion  abnormalities. Grade II diastolic dysfunction (pseudonormalization).   2. RVF is normal. The right ventricular size is normal. There is normal pulmonary artery systolic pressure. The estimated right ventricular systolic pressure is 21.7 mmHg.   3. Left atrial size was severely dilated.   4. Right atrial size was severely dilated.   5. The mitral valve is abnormal. Severe mitral valve regurgitation. No evidence of mitral stenosis.   6. Tricuspid valve regurgitation is moderate.   7. The aortic valve is tricuspid. Aortic valve regurgitation is mild to moderate. Aortic valve sclerosis is present, with no evidence of aortic valve stenosis.     Neuro/Psych  Headaches    GI/Hepatic Neg liver ROS, hiatal hernia,GERD  Medicated and Controlled,,  Endo/Other  Hypothyroidism    Renal/GU Renal InsufficiencyRenal disease     Musculoskeletal  (+) Arthritis ,    Abdominal   Peds  Hematology eliquis    Anesthesia Other Findings   Reproductive/Obstetrics                              Anesthesia  Physical Anesthesia Plan  ASA: 4  Anesthesia Plan: General   Post-op Pain Management: Minimal or no pain anticipated   Induction:   PONV Risk Score and Plan: 4 or greater and Treatment may vary due to age or medical condition  Airway Management Planned: Natural Airway and Simple Face Mask  Additional Equipment: None  Intra-op Plan:   Post-operative Plan:   Informed Consent: I have reviewed the patients History and Physical, chart, labs and discussed the procedure including the risks, benefits and alternatives for the proposed anesthesia with the patient or authorized representative who has indicated his/her understanding and acceptance.     Dental advisory given  Plan Discussed with: CRNA and Surgeon  Anesthesia Plan Comments:         Anesthesia Quick Evaluation

## 2023-08-18 NOTE — Transfer of Care (Signed)
 Immediate Anesthesia Transfer of Care Note  Patient: Leslie [name]  Procedure(s) Performed: CARDIOVERSION  Patient Location: PACU  Anesthesia Type:General  Level of Consciousness: drowsy  Airway & Oxygen Therapy: Patient Spontanous Breathing and Patient connected to face mask oxygen  Post-op Assessment: Report given to RN and Post -op Vital signs reviewed and stable  Post vital signs: Reviewed and stable  Last Vitals:  Vitals Value Taken Time  BP    Temp    Pulse 49 08/18/23 1053  Resp 17 08/18/23 1053  SpO2 94 % 08/18/23 1053  Vitals shown include unfiled device data.  Last Pain:  Vitals:   08/18/23 1015  TempSrc:   PainSc: 0-No pain         Complications: No notable events documented.

## 2023-08-18 NOTE — CV Procedure (Signed)
    CARDIOVERSION NOTE  Procedure: Electrical Cardioversion Indications:  Atrial Fibrillation  Procedure Details:  Consent: Risks of procedure as well as the alternatives and risks of each were explained to the (patient/caregiver).  Consent for procedure obtained.  Time Out: Verified patient identification, verified procedure, site/side was marked, verified correct patient position, special equipment/implants available, medications/allergies/relevent history reviewed, required imaging and test results available.  Performed  Patient placed on cardiac monitor, pulse oximetry, supplemental oxygen as necessary.  Sedation given:  propofol  per anesthesia Pacer pads placed anterior and posterior chest.  Cardioverted 2 time(s).  Cardioverted at 300J and 360J biphasic  Impression: Findings: Post procedure EKG shows: NSR Complications: None Patient did tolerate procedure well.  Plan: Successful DCCV after 2 shocks at 300 and 360J to NSR.  Time Spent Directly with the Patient:  30 minutes   Hazle Lites, MD, [facility], FNLA, FACP  [place]  [facility]  Medical Director of the Advanced Lipid Disorders &  Cardiovascular Risk Reduction Clinic Diplomate of the American Board of Clinical Lipidology Attending Cardiologist  Direct Dial: [phone]  Fax: [phone]  Website:  [email or web address]  Leslie [name] Leslie [name] 08/18/2023, 10:52 AM

## 2023-08-18 NOTE — Interval H&P Note (Signed)
 History and Physical Interval Note:  08/18/2023 10:34 AM  Leslie [name]  has presented today for surgery, with the diagnosis of AFIB.  The various methods of treatment have been discussed with the patient and family. After consideration of risks, benefits and other options for treatment, the patient has consented to  Procedure(s): CARDIOVERSION (N/A) as a surgical intervention.  The patient's history has been reviewed, patient examined, no change in status, stable for surgery.  I have reviewed the patient's chart and labs.  Questions were answered to the patient's satisfaction.     Hazle Lites

## 2023-08-19 ENCOUNTER — Other Ambulatory Visit: Payer: Self-pay | Admitting: Cardiovascular Disease

## 2023-08-19 ENCOUNTER — Other Ambulatory Visit (HOSPITAL_BASED_OUTPATIENT_CLINIC_OR_DEPARTMENT_OTHER): Payer: Self-pay

## 2023-08-19 ENCOUNTER — Other Ambulatory Visit: Payer: Self-pay

## 2023-08-19 MED ORDER — POTASSIUM CHLORIDE CRYS ER 20 MEQ PO TBCR
20.0000 meq | EXTENDED_RELEASE_TABLET | Freq: Two times a day (BID) | ORAL | 2 refills | Status: DC
  Filled 2023-08-19: qty 60, 30d supply, fill #0
  Filled 2023-09-16: qty 60, 30d supply, fill #1
  Filled 2023-09-18: qty 120, 60d supply, fill #1

## 2023-08-19 MED FILL — Amiodarone HCl Tab 200 MG: ORAL | 90 days supply | Qty: 90 | Fill #1 | Status: AC

## 2023-08-20 ENCOUNTER — Other Ambulatory Visit (HOSPITAL_BASED_OUTPATIENT_CLINIC_OR_DEPARTMENT_OTHER): Payer: Self-pay

## 2023-08-21 ENCOUNTER — Other Ambulatory Visit (HOSPITAL_BASED_OUTPATIENT_CLINIC_OR_DEPARTMENT_OTHER): Payer: Self-pay

## 2023-08-21 ENCOUNTER — Other Ambulatory Visit: Payer: Self-pay

## 2023-08-21 ENCOUNTER — Telehealth: Payer: Self-pay

## 2023-08-21 DIAGNOSIS — I4819 Other persistent atrial fibrillation: Secondary | ICD-10-CM

## 2023-08-21 DIAGNOSIS — I340 Nonrheumatic mitral (valve) insufficiency: Secondary | ICD-10-CM

## 2023-08-21 NOTE — Telephone Encounter (Signed)
 Discussed plan with the patient ay length.  She understands Dr. Marven Slimmer believes her rhythm may be difficult to control given severity of her mitral regurgitation. Per Drs. Croitoru and Marven Slimmer, scheduled her for TEE to further assess on 09/01/2023. She will have labs drawn at her PCP's office. While she understands her LAAO is on hold, she requested the procedure is not cancelled until TEE is completed and plan is confirmed. She was grateful for call and agreed with plan.

## 2023-08-25 DIAGNOSIS — I4891 Unspecified atrial fibrillation: Secondary | ICD-10-CM | POA: Diagnosis not present

## 2023-08-25 DIAGNOSIS — N390 Urinary tract infection, site not specified: Secondary | ICD-10-CM | POA: Diagnosis not present

## 2023-08-25 LAB — LAB REPORT - SCANNED: EGFR (Non-African Amer.): 47

## 2023-08-26 ENCOUNTER — Other Ambulatory Visit (HOSPITAL_BASED_OUTPATIENT_CLINIC_OR_DEPARTMENT_OTHER): Payer: Self-pay

## 2023-08-26 ENCOUNTER — Other Ambulatory Visit (HOSPITAL_COMMUNITY): Payer: Self-pay

## 2023-08-26 DIAGNOSIS — I480 Paroxysmal atrial fibrillation: Secondary | ICD-10-CM | POA: Diagnosis not present

## 2023-08-26 DIAGNOSIS — J0100 Acute maxillary sinusitis, unspecified: Secondary | ICD-10-CM | POA: Diagnosis not present

## 2023-08-26 DIAGNOSIS — N390 Urinary tract infection, site not specified: Secondary | ICD-10-CM | POA: Diagnosis not present

## 2023-08-26 DIAGNOSIS — D649 Anemia, unspecified: Secondary | ICD-10-CM | POA: Diagnosis not present

## 2023-08-26 MED ORDER — DOXYCYCLINE HYCLATE 100 MG PO TABS
100.0000 mg | ORAL_TABLET | Freq: Two times a day (BID) | ORAL | 0 refills | Status: AC
  Filled 2023-08-26: qty 10, 5d supply, fill #0

## 2023-08-28 ENCOUNTER — Other Ambulatory Visit (HOSPITAL_BASED_OUTPATIENT_CLINIC_OR_DEPARTMENT_OTHER): Payer: Self-pay

## 2023-08-28 MED ORDER — NITROFURANTOIN MONOHYD MACRO 100 MG PO CAPS
100.0000 mg | ORAL_CAPSULE | Freq: Two times a day (BID) | ORAL | 0 refills | Status: AC
  Filled 2023-08-28: qty 10, 5d supply, fill #0

## 2023-08-29 ENCOUNTER — Other Ambulatory Visit (HOSPITAL_BASED_OUTPATIENT_CLINIC_OR_DEPARTMENT_OTHER): Payer: Self-pay

## 2023-08-29 NOTE — Progress Notes (Signed)
 Called patient with pre-procedure instructions for Monday May 19th.   Patient informed of:   Time to arrive for procedure. " I will do my best to arrive by 0945" Remain NPO past midnight.  Must have a ride home and a responsible adult to remain with them for 24 hours post procedure.  Confirmed blood thinner. Confirmed no breaks in taking blood thinner for 3+ weeks prior to procedure. Confirmed patient stopped all GLP-1s and GLP-2s for at least one week before procedure.

## 2023-09-01 ENCOUNTER — Ambulatory Visit (HOSPITAL_COMMUNITY)
Admission: RE | Admit: 2023-09-01 | Discharge: 2023-09-01 | Disposition: A | Discharge status: Home / Self Care | Attending: Cardiovascular Disease | Admitting: Cardiovascular Disease

## 2023-09-01 ENCOUNTER — Encounter (HOSPITAL_COMMUNITY)
Admission: RE | Disposition: A | Discharge status: Home / Self Care | Payer: Self-pay | Source: Home / Self Care | Attending: Cardiovascular Disease

## 2023-09-01 ENCOUNTER — Encounter (HOSPITAL_COMMUNITY): Payer: Self-pay | Admitting: Cardiovascular Disease

## 2023-09-01 ENCOUNTER — Ambulatory Visit (HOSPITAL_COMMUNITY)

## 2023-09-01 ENCOUNTER — Other Ambulatory Visit: Payer: Self-pay

## 2023-09-01 ENCOUNTER — Ambulatory Visit (HOSPITAL_COMMUNITY)
Admission: RE | Admit: 2023-09-01 | Discharge: 2023-09-01 | Disposition: A | Discharge status: Home / Self Care | Source: Ambulatory Visit | Attending: Cardiovascular Disease | Admitting: Cardiovascular Disease

## 2023-09-01 DIAGNOSIS — N189 Chronic kidney disease, unspecified: Secondary | ICD-10-CM | POA: Insufficient documentation

## 2023-09-01 DIAGNOSIS — I4819 Other persistent atrial fibrillation: Secondary | ICD-10-CM

## 2023-09-01 DIAGNOSIS — I10 Essential (primary) hypertension: Secondary | ICD-10-CM

## 2023-09-01 DIAGNOSIS — I130 Hypertensive heart and chronic kidney disease with heart failure and stage 1 through stage 4 chronic kidney disease, or unspecified chronic kidney disease: Secondary | ICD-10-CM | POA: Diagnosis not present

## 2023-09-01 DIAGNOSIS — I110 Hypertensive heart disease with heart failure: Secondary | ICD-10-CM | POA: Diagnosis not present

## 2023-09-01 DIAGNOSIS — E669 Obesity, unspecified: Secondary | ICD-10-CM | POA: Diagnosis not present

## 2023-09-01 DIAGNOSIS — I509 Heart failure, unspecified: Secondary | ICD-10-CM | POA: Diagnosis not present

## 2023-09-01 DIAGNOSIS — I083 Combined rheumatic disorders of mitral, aortic and tricuspid valves: Secondary | ICD-10-CM | POA: Diagnosis not present

## 2023-09-01 DIAGNOSIS — E039 Hypothyroidism, unspecified: Secondary | ICD-10-CM | POA: Insufficient documentation

## 2023-09-01 DIAGNOSIS — D6869 Other thrombophilia: Secondary | ICD-10-CM | POA: Diagnosis not present

## 2023-09-01 DIAGNOSIS — Z6837 Body mass index (BMI) 37.0-37.9, adult: Secondary | ICD-10-CM | POA: Diagnosis not present

## 2023-09-01 DIAGNOSIS — I340 Nonrheumatic mitral (valve) insufficiency: Secondary | ICD-10-CM

## 2023-09-01 DIAGNOSIS — Z79899 Other long term (current) drug therapy: Secondary | ICD-10-CM | POA: Insufficient documentation

## 2023-09-01 DIAGNOSIS — Z7901 Long term (current) use of anticoagulants: Secondary | ICD-10-CM | POA: Diagnosis not present

## 2023-09-01 DIAGNOSIS — E785 Hyperlipidemia, unspecified: Secondary | ICD-10-CM | POA: Diagnosis not present

## 2023-09-01 DIAGNOSIS — I5043 Acute on chronic combined systolic (congestive) and diastolic (congestive) heart failure: Secondary | ICD-10-CM | POA: Diagnosis not present

## 2023-09-01 DIAGNOSIS — I2510 Atherosclerotic heart disease of native coronary artery without angina pectoris: Secondary | ICD-10-CM | POA: Insufficient documentation

## 2023-09-01 DIAGNOSIS — I255 Ischemic cardiomyopathy: Secondary | ICD-10-CM | POA: Diagnosis not present

## 2023-09-01 DIAGNOSIS — G4733 Obstructive sleep apnea (adult) (pediatric): Secondary | ICD-10-CM | POA: Insufficient documentation

## 2023-09-01 DIAGNOSIS — Z87891 Personal history of nicotine dependence: Secondary | ICD-10-CM

## 2023-09-01 DIAGNOSIS — D500 Iron deficiency anemia secondary to blood loss (chronic): Secondary | ICD-10-CM

## 2023-09-01 LAB — POCT I-STAT, CHEM 8
BUN: 18 mg/dL (ref 8–23)
Calcium, Ion: 1.18 mmol/L (ref 1.15–1.40)
Chloride: 105 mmol/L (ref 98–111)
Creatinine, Ser: 1.1 mg/dL — ABNORMAL HIGH (ref 0.44–1.00)
Glucose, Bld: 96 mg/dL (ref 70–99)
HCT: 35 % — ABNORMAL LOW (ref 36.0–46.0)
Hemoglobin: 11.9 g/dL — ABNORMAL LOW (ref 12.0–15.0)
Potassium: 4.8 mmol/L (ref 3.5–5.1)
Sodium: 139 mmol/L (ref 135–145)
TCO2: 23 mmol/L (ref 22–32)

## 2023-09-01 LAB — ECHO TEE
AR max vel: 2.26 cm2
AV Area VTI: 2.32 cm2
AV Area mean vel: 2.28 cm2
AV Mean grad: 3.5 mmHg
AV Peak grad: 7.1 mmHg
Ao pk vel: 1.33 m/s
MV M vel: 4.97 m/s
MV Peak grad: 98.9 mmHg
MV VTI: 0.4 cm2
Radius: 0.6 cm

## 2023-09-01 SURGERY — TRANSESOPHAGEAL ECHOCARDIOGRAM (TEE) (CATHLAB)
Anesthesia: Monitor Anesthesia Care

## 2023-09-01 MED ORDER — SODIUM CHLORIDE 0.9 % IV SOLN
INTRAVENOUS | Status: DC | PRN
Start: 2023-09-01 — End: 2023-09-01

## 2023-09-01 MED ORDER — PROPOFOL 10 MG/ML IV BOLUS
INTRAVENOUS | Status: DC | PRN
  Administered 2023-09-01 (×2): 30 mg via INTRAVENOUS
  Administered 2023-09-01: 60 mg via INTRAVENOUS
  Administered 2023-09-01 (×2): 20 mg via INTRAVENOUS

## 2023-09-01 MED ORDER — LIDOCAINE 2% (20 MG/ML) 5 ML SYRINGE
INTRAMUSCULAR | Status: DC | PRN
  Administered 2023-09-01: 100 mg via INTRAVENOUS

## 2023-09-01 MED ORDER — SODIUM CHLORIDE 0.9% FLUSH
3.0000 mL | Freq: Two times a day (BID) | INTRAVENOUS | Status: DC
Start: 2023-09-01 — End: 2023-09-01

## 2023-09-01 MED ORDER — SODIUM CHLORIDE 0.9% FLUSH: 3.0000 mL | INTRAVENOUS | Status: DC | PRN

## 2023-09-01 NOTE — Anesthesia Preprocedure Evaluation (Signed)
 Anesthesia Evaluation  Patient identified by MRN, date of birth, ID band Patient awake    Reviewed: Allergy & Precautions, NPO status , Patient's Chart, lab work & pertinent test results, reviewed documented beta blocker date and time   History of Anesthesia Complications (+) PONV  Airway Mallampati: I  TM Distance: >3 FB Neck ROM: Full    Dental  (+) Dental Advisory Given   Pulmonary sleep apnea and Continuous Positive Airway Pressure Ventilation , former smoker   breath sounds clear to auscultation       Cardiovascular hypertension, Pt. on medications and Pt. on home beta blockers (-) angina + dysrhythmias Atrial Fibrillation + Valvular Problems/Murmurs (severe) MR  Rhythm:Irregular Rate:Normal  06/2023 ECHO: EF 55 to 60%. 1. LV EF 57 %. The left ventricle has normal function, no regional wall motion  abnormalities. Grade II diastolic dysfunction (pseudonormalization).   2. RVF is normal. The right ventricular size is normal. There is normal pulmonary artery systolic pressure. The estimated right ventricular systolic pressure is 21.7 mmHg.   3. Left atrial size was severely dilated.   4. Right atrial size was severely dilated.   5. The mitral valve is abnormal. Severe mitral valve regurgitation. No evidence of mitral stenosis.   6. Tricuspid valve regurgitation is moderate.   7. The aortic valve is tricuspid. Aortic valve regurgitation is mild to moderate. Aortic valve sclerosis is present, with no evidence of aortic valve stenosis.     Neuro/Psych  Headaches    GI/Hepatic Neg liver ROS, hiatal hernia,GERD  Medicated and Controlled,,  Endo/Other  Hypothyroidism    Renal/GU Renal InsufficiencyRenal disease     Musculoskeletal  (+) Arthritis ,    Abdominal  (+) + obese  Peds  Hematology eliquis    Anesthesia Other Findings   Reproductive/Obstetrics                             Anesthesia  Physical Anesthesia Plan  ASA: 3  Anesthesia Plan: MAC   Post-op Pain Management: Minimal or no pain anticipated   Induction: Intravenous  PONV Risk Score and Plan: 3 and Treatment may vary due to age or medical condition and Propofol  infusion  Airway Management Planned: Natural Airway and Nasal Cannula  Additional Equipment: None  Intra-op Plan:   Post-operative Plan:   Informed Consent: I have reviewed the patients History and Physical, chart, labs and discussed the procedure including the risks, benefits and alternatives for the proposed anesthesia with the patient or authorized representative who has indicated his/her understanding and acceptance.     Dental advisory given  Plan Discussed with: CRNA and Surgeon  Anesthesia Plan Comments:        Anesthesia Quick Evaluation

## 2023-09-01 NOTE — Transfer of Care (Signed)
 Immediate Anesthesia Transfer of Care Note  Patient: Leslie [name]  Procedure(s) Performed: TRANSESOPHAGEAL ECHOCARDIOGRAM  Patient Location: Cath Lab  Anesthesia Type:MAC  Level of Consciousness: awake, alert , and oriented  Airway & Oxygen Therapy: Patient Spontanous Breathing  Post-op Assessment: Report given to RN and Post -op Vital signs reviewed and stable  Post vital signs: Reviewed and stable  Last Vitals:  Vitals Value Taken Time  BP 109/49 09/01/23 1148  Temp 36.4 C 09/01/23 1148  Pulse 49 09/01/23 1148  Resp 13 09/01/23 1148  SpO2 95 % 09/01/23 1148  Vitals shown include unfiled device data.  Last Pain:  Vitals:   09/01/23 1148  TempSrc: Tympanic  PainSc: Asleep         Complications: No notable events documented.

## 2023-09-01 NOTE — Anesthesia Postprocedure Evaluation (Signed)
 Anesthesia Post Note  Patient: Leslie [name]  Procedure(s) Performed: TRANSESOPHAGEAL ECHOCARDIOGRAM     Patient location during evaluation: PACU Anesthesia Type: MAC Level of consciousness: awake and alert Pain management: pain level controlled Vital Signs Assessment: post-procedure vital signs reviewed and stable Respiratory status: spontaneous breathing, nonlabored ventilation and respiratory function stable Cardiovascular status: blood pressure returned to baseline and stable Postop Assessment: no apparent nausea or vomiting Anesthetic complications: no   No notable events documented.  Last Vitals:  Vitals:   09/01/23 1158 09/01/23 1208  BP: (!) 118/58 (!) 122/57  Pulse: (!) 49 (!) 50  Resp: 10 12  Temp:    SpO2: 97% 96%    Last Pain:  Vitals:   09/01/23 1208  TempSrc:   PainSc: 0-No pain                 Earvin Goldberg

## 2023-09-01 NOTE — Progress Notes (Signed)
 Echocardiogram Echocardiogram Transesophageal has been performed.  Leslie [name] 09/01/2023, 12:05 PM

## 2023-09-01 NOTE — Discharge Instructions (Signed)

## 2023-09-01 NOTE — Interval H&P Note (Signed)
 History and Physical Interval Note:  09/01/2023 9:56 AM  Leslie [name]  has presented today for surgery, with the diagnosis of mitral regurgitation, a fib.  The various methods of treatment have been discussed with the patient and family. After consideration of risks, benefits and other options for treatment, the patient has consented to  Procedure(s): TRANSESOPHAGEAL ECHOCARDIOGRAM (N/A) as a surgical intervention.  The patient's history has been reviewed, patient examined, no change in status, stable for surgery.  I have reviewed the patient's chart and labs.  Questions were answered to the patient's satisfaction.     Shammara Jarrett

## 2023-09-02 ENCOUNTER — Telehealth: Payer: Self-pay

## 2023-09-02 NOTE — Telephone Encounter (Signed)
-----   Message from Boyce Byes sent at 09/02/2023  3:47 PM EDT ----- OK to proceed w Watchman eval. Thanks,  Donelda Fujita ----- Message ----- From: Allison Ivory, RN Sent: 09/01/2023   5:36 PM EDT To: Boyce Byes, MD  Hey Dr. Marven Slimmer! Ms. Wilner was originally scheduled for LAAO only, but you thought rhythm would be hard to control with bad MR so Watchman was put on hold. Her TEE today showed moderate MR so mTEER isn't indicated. I agreed to keep her as scheduled for LAAO until you saw the TEE and finalized plan. Let me know how to proceed.  Thank you!  KK

## 2023-09-02 NOTE — Telephone Encounter (Signed)
 Per Dr. Marven Slimmer, informed the patient she may proceed as scheduled with LAAO on 11/13/2023. The patient was grateful for call and agreed with plan.

## 2023-09-04 ENCOUNTER — Other Ambulatory Visit (HOSPITAL_BASED_OUTPATIENT_CLINIC_OR_DEPARTMENT_OTHER): Payer: Self-pay

## 2023-09-05 ENCOUNTER — Ambulatory Visit: Payer: PPO | Admitting: Cardiovascular Disease

## 2023-09-05 ENCOUNTER — Other Ambulatory Visit (HOSPITAL_BASED_OUTPATIENT_CLINIC_OR_DEPARTMENT_OTHER): Payer: Self-pay

## 2023-09-05 MED ORDER — FLUTICASONE PROPIONATE 50 MCG/ACT NA SUSP
2.0000 | Freq: Every day | NASAL | 5 refills | Status: AC
  Filled 2023-09-05: qty 16, 30d supply, fill #0
  Filled 2023-09-06: qty 48, 90d supply, fill #0
  Filled 2024-01-13: qty 48, 90d supply, fill #1

## 2023-09-06 ENCOUNTER — Other Ambulatory Visit (HOSPITAL_BASED_OUTPATIENT_CLINIC_OR_DEPARTMENT_OTHER): Payer: Self-pay

## 2023-09-08 NOTE — Progress Notes (Unsigned)
 Cardiology Office Note:    Date:  09/09/2023   ID:  Leslie, [name] 01-16-1946, MRN [id number]  PCP:  Ransom Byers, MD  Cardiologist:  Luana Rumple, MD    Referring MD: Ransom Byers, *   Chief Complaint  Patient presents with   Atrial Fibrillation     History of Present Illness:    Leslie [name] is a 78 y.o. female with a hx of Persistent atrial fibrillation and diastolic heart failure returning for follow-up.  Additional medical problems include OSA, HTN, prediabetes, HLP, CKD 3A, moderate MR, moderate AI, morbid obesity.  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  Cardiac catheterization showed normal coronary arteries.  Flecainide  and verapamil  were discontinued and she was switched to amiodarone  and metoprolol , as well as Entresto .  Follow-up echocardiogram 05/22/2022 showed complete normalization of left ventricular systolic function with EF 65 to 70%.  She most recently required a cardioversion 08/18/2023.  She then underwent a transesophageal echocardiogram on 09/01/2023 to re-evaluate the mitral regurgitation which was still felt to remain moderate.  During the same procedure we performed baseline measurements for the Watchman occluder (average diameter 2.1-2.5 cm, depth 2.4-2.5 cm, windsock morphology, probably best suited for a 27 mm device 11% compression or more likely 31 mm device with 23% compression).    She is compliant with CPAP and denies daytime hypersomnolence.  She has had recurrent problems with iron  deficiency anemia, with hemoglobin as low as 7.  She is tentatively scheduled for left atrial appendage occlusion 11/13/2023.  Metabolic control remains good.  She does have moderately elevated LP(a) at 155.  Most recent hemoglobin A1c was 5.9% and LDL was 85.  She does not have visible coronary plaque on imaging studies and has not had clinically evident PAD or CAD.  Most recent creatinine  was 1.10, but her typical baseline is approximately 1.3, corresponding to a GFR of about 40-50 .  She is on chronic levothyroxine  supplementation.  TSH was normal in February at 3.510.   Initially presented with persistent atrial fibrillation in April 2018, had a difficult time with rate control and underwent TEE guided cardioversion.  Beta-blocker dose was decreased in May 2018 when she had junctional rhythm.  After she received her second dose of coronavirus vaccine in February 2021 she developed symptomatic persistent atrial fibrillation with RVR, again challenging to rate control and underwent cardioversion on June 24, 2019.  She then had to undergo cardioversion twice in 2022: First in January, with early recurrence of atrial fibrillation after which flecainide  was started;  second cardioversion in February after which she developed pulmonary edema and required brief hospitalization. In December 2023 she presented with acute systolic heart failure with EF 25-30% and severe hypertension.  Probable Takotsubo syndrome.  Flecainide  and verapamil  were discontinued but she continued on metoprolol  and added Entresto  and amiodarone .  Follow-up echo in February 2024 showed complete normalization of LV function. A nuclear stress test on May 26, 2020 did not show any evidence of ischemia.   Echocardiogram on June 01, 2020 showed LVEF 55-60% with "pseudo normal" mitral inflow and no major valvular abnormalities (mild mitral regurgitation and moderate aortic regurgitation, unchanged from the previous one performed in March 2021.  The left atrium is moderately dilated.  LVEF is normal.  "Pseudonormal" mitral inflow was described, but this was performed relatively soon after her cardioversion0  She has iron  deficiency anemia and is receiving iron  infusions.  She  was intolerant of oral iron  due to severe constipation.  Additional problems include morbid obesity, obstructive sleep apnea on CPAP and treated  hypertension.     Past Medical History:  Diagnosis Date   Adenomatous polyp    Arthritis    "knees, legs, fingers" (07/30/2016)   Asthma    Bursitis of left shoulder    "just finished PT" (07/30/2016)   Chest pain    a. 2003 Abnl stress test-->Cath: nonobs CAD.   CHF (congestive heart failure) (HCC)    Chronic bronchitis (HCC)    Chronic kidney disease    stage 3   GERD (gastroesophageal reflux disease)    barrett's esophagus- Dr. Dellis Fermo   History of hiatal hernia    Hyperlipidemia    Hypertension    Hypothyroidism    Migraines    "sporatic; at least a few/year" (07/30/2016)   Mitral regurgitation    a. 07/2016 Echo: mild to mod MR;  b. 07/2016 TEE: mild MR.   Moderate aortic insufficiency    a. 07/2016 Echo: EF 55-60%, mod AI;  b. 07/2016 TEE: EF 50-55%, mod AI.   Obesity    s/p lap band surgery 6/10   OSA on CPAP    PAF (paroxysmal atrial fibrillation) (HCC)    a. 07/2016 TEE/DCCV: EF 50-55%, mild MR, mod AI, mild to mod TR, neg bubble study-->successful DCCV x1 w/ 120J.   PONV (postoperative nausea and vomiting)    Vitamin D deficiency     Past Surgical History:  Procedure Laterality Date   APPENDECTOMY     incidental   CARDIOVERSION N/A 08/01/2016   Procedure: CARDIOVERSION;  Surgeon: Hugh Madura, MD;  Location: [facility] ENDOSCOPY;  Service: Cardiovascular;  Laterality: N/A;   CARDIOVERSION N/A 06/24/2019   Procedure: CARDIOVERSION;  Surgeon: Luana Rumple, MD;  Location: [facility] ENDOSCOPY;  Service: Cardiovascular;  Laterality: N/A;   CARDIOVERSION N/A 05/30/2020   Procedure: CARDIOVERSION;  Surgeon: Luana Rumple, MD;  Location: [facility] ENDOSCOPY;  Service: Cardiovascular;  Laterality: N/A;   CARDIOVERSION N/A 05/09/2020   Procedure: CARDIOVERSION;  Surgeon: Wendie Hamburg, MD;  Location: [facility] ENDOSCOPY;  Service: Cardiovascular;  Laterality: N/A;   CARDIOVERSION N/A 08/18/2023   Procedure: CARDIOVERSION;  Surgeon: Hazle Lites, MD;  Location: [facility] INVASIVE CV LAB;   Service: Cardiovascular;  Laterality: N/A;   CATARACT EXTRACTION W/ INTRAOCULAR LENS  IMPLANT, BILATERAL Bilateral ~ 2008-2017   left - right   COMBINED HYSTERECTOMY VAGINAL / OOPHORECTOMY / A&P REPAIR  1981   ESOPHAGOGASTRODUODENOSCOPY N/A 07/08/2023   Procedure: EGD (ESOPHAGOGASTRODUODENOSCOPY);  Surgeon: Genell Ken, MD;  Location: [facility] ENDOSCOPY;  Service: Gastroenterology;  Laterality: N/A;   GIVENS CAPSULE STUDY N/A 07/08/2023   Procedure: IMAGING PROCEDURE, GI TRACT, INTRALUMINAL, VIA CAPSULE;  Surgeon: Genell Ken, MD;  Location: [facility] ENDOSCOPY;  Service: Gastroenterology;  Laterality: N/A;   HERNIA REPAIR     INGUINAL HERNIA REPAIR Left 1952   LAPAROSCOPIC GASTRIC BANDING WITH HIATAL HERNIA REPAIR  09/2008   RIGHT/LEFT HEART CATH AND CORONARY ANGIOGRAPHY N/A 04/11/2022   Procedure: RIGHT/LEFT HEART CATH AND CORONARY ANGIOGRAPHY;  Surgeon: Lucendia Rusk, MD;  Location: [facility] INVASIVE CV LAB;  Service: Cardiovascular;  Laterality: N/A;   TEE WITHOUT CARDIOVERSION N/A 08/01/2016   Procedure: TRANSESOPHAGEAL ECHOCARDIOGRAM (TEE);  Surgeon: Hugh Madura, MD;  Location: [facility] ENDOSCOPY;  Service: Cardiovascular;  Laterality: N/A;   TONSILLECTOMY AND ADENOIDECTOMY  1974   TRANSESOPHAGEAL ECHOCARDIOGRAM (CATH LAB) N/A 09/01/2023   Procedure: TRANSESOPHAGEAL ECHOCARDIOGRAM;  Surgeon: Luana Rumple, MD;  Location:  [facility] INVASIVE CV LAB;  Service: Cardiovascular;  Laterality: N/A;   VAGINAL HYSTERECTOMY  1981   "w/1 ovary"    Current Medications: Current Meds  Medication Sig   acetaminophen  (TYLENOL ) 650 MG CR tablet Take 1,300 mg by mouth at bedtime as needed for pain.   amiodarone  (PACERONE ) 200 MG tablet Take 1 tablet (200 mg total) by mouth daily.   amLODipine  (NORVASC ) 5 MG tablet Take 1 tablet (5 mg total) by mouth daily.   apixaban  (ELIQUIS ) 5 MG TABS tablet Take 1 tablet (5 mg total) by mouth 2 (two) times daily.   atorvastatin  (LIPITOR) 40 MG tablet Take 1 tablet (40 mg total) by mouth  in the morning.   Bacillus Coagulans-Inulin (ALIGN PREBIOTIC-PROBIOTIC PO) Take 2 capsules by mouth daily.   Calcium  Carb-Cholecalciferol (CALCIUM  + D3 PO) Take 1 tablet by mouth daily.   carvedilol  (COREG ) 25 MG tablet Take 1 tablet (25 mg total) by mouth 2 (two) times daily.   cholecalciferol (VITAMIN D) 1000 UNITS tablet Take 1,000 Units by mouth daily.   diclofenac Sodium (VOLTAREN) 1 % GEL Apply 1 application topically 2 (two) times daily.   fexofenadine (ALLEGRA) 180 MG tablet Take 180 mg by mouth daily.   fluticasone  (FLONASE ) 50 MCG/ACT nasal spray Place 2 sprays into both nostrils at bedtime.    fluticasone  (FLONASE ) 50 MCG/ACT nasal spray Place 2 sprays into both nostrils daily.   Fluticasone  Furoate (ARNUITY ELLIPTA ) 50 MCG/ACT AEPB Inhale 1 puff by mouth daily. (Patient taking differently: Take 1 puff by mouth every evening.)   furosemide  (LASIX ) 20 MG tablet Take 1 tablet (20 mg) daily. May take an additional 1 tablet (20 mg) on the days of increase swelling, weight gain of 3 lb overnight or 5 lb in 1 week.   Glucosamine-Chondroitin (OSTEO BI-FLEX REGULAR STRENGTH PO) Take 1 tablet by mouth daily.   levothyroxine  (SYNTHROID ) 75 MCG tablet Take 1 tablet (75 mcg total) by mouth every morning on an empty stomach.   magnesium  oxide (MAG-OX) 400 (240 Mg) MG tablet Take 1 tablet (400 mg total) by mouth daily.   mometasone (ELOCON) 0.1 % cream Apply 1 application  topically 2 (two) times daily as needed (skin irritation).   Multiple Vitamins-Minerals (CENTRUM MULTIGUMMIES) CHEW Takes 1 gummy by mouth daily   omeprazole  (PRILOSEC) 20 MG capsule Take 2 capsules (40 mg total) by mouth daily.   potassium chloride  SA (KLOR-CON  M) 20 MEQ tablet Take 1 tablet (20 mEq total) by mouth 2 (two) times daily.   sertraline  (ZOLOFT ) 25 MG tablet Take 1 tablet (25 mg total) by mouth daily.   [DISCONTINUED] carvedilol  (COREG ) 12.5 MG tablet Take 2 tablets (25 mg total) by mouth every morning AND 1 tablet  (12.5 mg total) at bedtime. (Patient taking differently: Take 25 mg by mouth twice daily)     Allergies:   Clindamycin hcl, Augmentin [amoxicillin-pot clavulanate], Codeine, Demerol [meperidine], and Sulfa antibiotics     Family History: The patient's family history includes Emphysema in her father; Heart failure in her mother; Hypertension in her father and mother. ROS:   Please see the history of present illness.   All other systems are reviewed and are negative.   EKGs/Labs/Other Studies Reviewed:     EKG: ECG 08/18/2023 following cardioversion mild sinus bradycardia and somewhat delayed anterior R wave progression.  EKG Interpretation Date/Time:    Ventricular Rate:    PR Interval:    QRS Duration:    QT Interval:    QTC Calculation:  R Axis:      Text Interpretation:           Recent Labs: 09/04/2021 Hemoglobin A1c 5.7% Hemoglobin 13.0, creatinine 1.1, potassium 4.4, ALT 13, TSH 2.61  07/26/2022 Creatinine 1.34, potassium 4.5, ALT 8, TSH 5.0.  10/31/2022 TSH 2.91  01/10/2023 hemoglobin 10.1, ALT 18  02/05/2023 creatinine 1.52, potassium 4.5  BMET    Latest Ref Rng & Units 09/01/2023   11:07 AM 05/31/2023    1:58 PM 05/29/2023    8:46 AM  BMP  Glucose 70 - 99 mg/dL 96  98  161   BUN 8 - 23 mg/dL 18  17  24    Creatinine 0.44 - 1.00 mg/dL 0.96  0.45  4.09   Sodium 135 - 145 mmol/L 139  138  141   Potassium 3.5 - 5.1 mmol/L 4.8  3.6  3.9   Chloride 98 - 111 mmol/L 105  105  108   CO2 22 - 32 mmol/L  22  26   Calcium  8.9 - 10.3 mg/dL  8.5  8.6     Recent Lipid Panel 09/04/2021 Cholesterol 179, HDL 40, LDL 104, triglycerides 200  09/19/2022 Cholesterol 156, HDL 45, LDL 85, triglycerides 153, A1c 5.9%     Component Value Date/Time   CHOL 146 07/31/2016 0435   TRIG 174 (H) 07/31/2016 0435   HDL 32 (L) 07/31/2016 0435   CHOLHDL 4.6 07/31/2016 0435   VLDL 35 07/31/2016 0435   LDLCALC 79 07/31/2016 0435    Physical Exam:    VS:  BP 123/67 (BP  Location: Left Arm, Patient Position: Sitting, Cuff Size: Large)   Pulse (!) 52   Ht 5\' 5"  (1.651 m)   Wt 102.4 kg   SpO2 98%   BMI 37.58 kg/m     Repeat BP 140/70.  Increasing carvedilol  dose.  Wt Readings from Last 3 Encounters:  09/09/23 102.4 kg  09/01/23 100.7 kg  08/18/23 100.7 kg      General: Alert, oriented x3, no distress, severely obese. Head: no evidence of trauma, PERRL, EOMI, no exophtalmos or lid lag, no myxedema, no xanthelasma; normal ears, nose and oropharynx Neck: normal jugular venous pulsations and no hepatojugular reflux; brisk carotid pulses without delay and no carotid bruits Chest: clear to auscultation, no signs of consolidation by percussion or palpation, normal fremitus, symmetrical and full respiratory excursions Cardiovascular: normal position and quality of the apical impulse, regular rhythm, normal first and second heart sounds, 1/6 apical holosystolic murmur, no diastolic murmurs, rubs or gallops Abdomen: no tenderness or distention, no masses by palpation, no abnormal pulsatility or arterial bruits, normal bowel sounds, no hepatosplenomegaly Extremities: no clubbing, cyanosis or edema; 2+ radial, ulnar and brachial pulses bilaterally; 2+ right femoral, posterior tibial and dorsalis pedis pulses; 2+ left femoral, posterior tibial and dorsalis pedis pulses; no subclavian or femoral bruits Neurological: grossly nonfocal Psych: Normal mood and affect     ASSESSMENT:    1. Persistent atrial fibrillation (HCC)   2. Paroxysmal atrial tachycardia (HCC)   3. Encounter for monitoring amiodarone  therapy   4. Acquired thrombophilia (HCC)   5. Iron  deficiency anemia due to chronic blood loss   6. Essential hypertension   7. Chronic diastolic heart failure (HCC)   8. Nonrheumatic mitral valve regurgitation   9. Nonrheumatic aortic valve insufficiency   10. Stage 3a chronic kidney disease (HCC)   11. OSA (obstructive sleep apnea)   12. Morbid obesity  (HCC)       PLAN:  In order of problems listed above:  AFib: Maintaining normal sinus rhythm on amiodarone , but required a recent cardioversion 08/18/2023 for breakthrough arrhythmia.  During treatment with flecainide  she had an episode of severe acute heart failure, possibly Takotsubo cardiomyopathy, which resolved completely.  Echo continues to show evidence of diastolic dysfunction.  I think the best choice is to continue amiodarone  despite its potential side effects.  Weight makes ablation less likely to be effective (BMI>37).  Defer antiarrhythmic decisions to Dr. Marven Slimmer.  CHADSVasc 5-6 (age3, gender, CHF, HTN, +/- preDM).  Currently on Eliquis  anticoagulation but has developed repeated problems with iron  deficiency anemia, including requiring transfusion.  Scheduled for Watchman device implantation 11/13/2023 PAT: This has previously caused symptomatic tachycardia even when atrial fibrillation was well-controlled Amiodarone : Normal thyroid  and liver function test in February 2025.  Needs to have a yearly ophthalmological exam.  Avoid prolonged sun exposure.  Discussed multiple possible drug interactions with amiodarone .  Talked about the possibility of rare but dangerous lung side effects.  She has not had a pulmonary function tests.  She is open to possible ablation if this is deemed the right choice by EP. Anticoagulation: Although she has not had severe overt bleeding problems, she develops severe anemia due to recurrent iron  deficiency even with iron  supplementation.  Scheduled for Watchman in late July.  Left atrial appendage measurements performed on TEE. Anemia: Labs in April still showed iron  deficiency (ferritin 15, transferrin saturation 10%).  Most recent hemoglobin 11.9 on 09/01/2023, unchanged from a month earlier HTN: Usually well-controlled CHF: Clinically euvolemic.  Currently NYHA functional class I-2.  On a very low-dose of loop diuretic.  On carvedilol .  No longer on  Entresto  following normalization of EF.  Even before her episode of Takotsubo cardiomyopathy she had 1 episode of heart failure with preserved ejection fraction that occurred immediately after cardioversion.  There is some underlying cardiomyopathy. MR: There was concern that she may have severe MR.  Underwent TEE 09/01/2023 that shows moderate MR. AR: Mild-moderate on recent TEE Probable podagra: No recent recurrence.  Uric acid level was 7.4 when checked last November. CKD3a-b: Baseline creatinine seems to be in the 1 3 range with estimated GFR of 40-50.  Recently stable.  In fact, most recent creatinine was 1.10 on 09/01/2023, although this is quite atypical for her. OSA: She reports 100% compliance with CPAP, denies hypersomnolence Obesity: Has lost a little weight.  Aware of the direct relationship between weight and burden of atrial fibrillation. Migraines: These do better on on the higher dose of metoprolol .  Verapamil  stopped due to low EF, could be restarted if necessary in the future.  Medication Adjustments/Labs and Tests Ordered: Current medicines are reviewed at length with the patient today.  Concerns regarding medicines are outlined above.  No orders of the defined types were placed in this encounter.   Meds ordered this encounter  Medications   carvedilol  (COREG ) 25 MG tablet    Sig: Take 1 tablet (25 mg total) by mouth 2 (two) times daily.    Dispense:  180 tablet    Refill:  3    Patient Instructions  Medication Instructions:  Your physician recommends that you continue on your current medications as directed. Please refer to the Current Medication list given to you today.  *If you need a refill on your cardiac medications before your next appointment, please call your pharmacy*  Lab Work: None ordered.  If you have labs (blood work) drawn today and your tests are completely  normal, you will receive your results only by: MyChart Message (if you have MyChart) OR A  paper copy in the mail If you have any lab test that is abnormal or we need to change your treatment, we will call you to review the results.  Testing/Procedures: None ordered.   Follow-Up: At [facility], you and your health needs are our priority.  As part of our continuing mission to provide you with exceptional heart care, our providers are all part of one team.  This team includes your primary Cardiologist (physician) and Advanced Practice Providers or APPs (Physician Assistants and Nurse Practitioners) who all work together to provide you with the care you need, when you need it.  Your next appointment:   6 months with Dr Alvis Ba       Signed, Luana Rumple, MD  09/09/2023 1:19 PM    [facility]

## 2023-09-09 ENCOUNTER — Other Ambulatory Visit (HOSPITAL_BASED_OUTPATIENT_CLINIC_OR_DEPARTMENT_OTHER): Payer: Self-pay

## 2023-09-09 ENCOUNTER — Encounter: Payer: Self-pay | Admitting: Cardiovascular Disease

## 2023-09-09 ENCOUNTER — Ambulatory Visit: Payer: PPO | Attending: Cardiovascular Disease | Admitting: Cardiovascular Disease

## 2023-09-09 VITALS — BP 123/67 | HR 52 | Ht 65.0 in | Wt 225.8 lb

## 2023-09-09 DIAGNOSIS — I10 Essential (primary) hypertension: Secondary | ICD-10-CM

## 2023-09-09 DIAGNOSIS — N1831 Chronic kidney disease, stage 3a: Secondary | ICD-10-CM | POA: Diagnosis not present

## 2023-09-09 DIAGNOSIS — I4819 Other persistent atrial fibrillation: Secondary | ICD-10-CM | POA: Diagnosis not present

## 2023-09-09 DIAGNOSIS — I4719 Other supraventricular tachycardia: Secondary | ICD-10-CM | POA: Diagnosis not present

## 2023-09-09 DIAGNOSIS — Z5181 Encounter for therapeutic drug level monitoring: Secondary | ICD-10-CM

## 2023-09-09 DIAGNOSIS — I340 Nonrheumatic mitral (valve) insufficiency: Secondary | ICD-10-CM

## 2023-09-09 DIAGNOSIS — D500 Iron deficiency anemia secondary to blood loss (chronic): Secondary | ICD-10-CM | POA: Diagnosis not present

## 2023-09-09 DIAGNOSIS — D6869 Other thrombophilia: Secondary | ICD-10-CM | POA: Diagnosis not present

## 2023-09-09 DIAGNOSIS — G4733 Obstructive sleep apnea (adult) (pediatric): Secondary | ICD-10-CM | POA: Diagnosis not present

## 2023-09-09 DIAGNOSIS — I5032 Chronic diastolic (congestive) heart failure: Secondary | ICD-10-CM

## 2023-09-09 DIAGNOSIS — I351 Nonrheumatic aortic (valve) insufficiency: Secondary | ICD-10-CM | POA: Diagnosis not present

## 2023-09-09 DIAGNOSIS — Z79899 Other long term (current) drug therapy: Secondary | ICD-10-CM

## 2023-09-09 MED ORDER — CARVEDILOL 25 MG PO TABS
25.0000 mg | ORAL_TABLET | Freq: Two times a day (BID) | ORAL | 3 refills | Status: AC
  Filled 2023-09-09: qty 180, 90d supply, fill #0
  Filled 2023-11-24: qty 180, 90d supply, fill #1
  Filled 2024-02-09: qty 180, 90d supply, fill #2
  Filled 2024-04-12: qty 180, 90d supply, fill #3

## 2023-09-09 NOTE — Patient Instructions (Addendum)
 Medication Instructions:  Your physician recommends that you continue on your current medications as directed. Please refer to the Current Medication list given to you today.  *If you need a refill on your cardiac medications before your next appointment, please call your pharmacy*  Lab Work: None ordered.  If you have labs (blood work) drawn today and your tests are completely normal, you will receive your results only by: MyChart Message (if you have MyChart) OR A paper copy in the mail If you have any lab test that is abnormal or we need to change your treatment, we will call you to review the results.  Testing/Procedures: None ordered.   Follow-Up: At [facility], you and your health needs are our priority.  As part of our continuing mission to provide you with exceptional heart care, our providers are all part of one team.  This team includes your primary Cardiologist (physician) and Advanced Practice Providers or APPs (Physician Assistants and Nurse Practitioners) who all work together to provide you with the care you need, when you need it.  Your next appointment:   6 months with Dr Alvis Ba

## 2023-09-10 ENCOUNTER — Other Ambulatory Visit (HOSPITAL_BASED_OUTPATIENT_CLINIC_OR_DEPARTMENT_OTHER): Payer: Self-pay

## 2023-09-11 ENCOUNTER — Other Ambulatory Visit (HOSPITAL_BASED_OUTPATIENT_CLINIC_OR_DEPARTMENT_OTHER): Payer: Self-pay

## 2023-09-11 MED ORDER — LEVOTHYROXINE SODIUM 75 MCG PO TABS
75.0000 ug | ORAL_TABLET | Freq: Every morning | ORAL | 0 refills | Status: DC
  Filled 2023-09-11: qty 90, 90d supply, fill #0

## 2023-09-15 ENCOUNTER — Telehealth: Payer: Self-pay

## 2023-09-15 NOTE — Telephone Encounter (Signed)
 Leslie [name]

## 2023-09-16 ENCOUNTER — Other Ambulatory Visit: Payer: Self-pay | Admitting: Cardiovascular Disease

## 2023-09-16 ENCOUNTER — Other Ambulatory Visit (HOSPITAL_BASED_OUTPATIENT_CLINIC_OR_DEPARTMENT_OTHER): Payer: Self-pay

## 2023-09-16 MED ORDER — METRONIDAZOLE 0.75 % EX CREA
TOPICAL_CREAM | Freq: Two times a day (BID) | CUTANEOUS | 3 refills | Status: AC
  Filled 2023-09-16: qty 45, 30d supply, fill #0

## 2023-09-18 ENCOUNTER — Other Ambulatory Visit (HOSPITAL_BASED_OUTPATIENT_CLINIC_OR_DEPARTMENT_OTHER): Payer: Self-pay

## 2023-09-18 ENCOUNTER — Other Ambulatory Visit: Payer: Self-pay | Admitting: Cardiovascular Disease

## 2023-09-18 MED ORDER — POTASSIUM CHLORIDE CRYS ER 20 MEQ PO TBCR
20.0000 meq | EXTENDED_RELEASE_TABLET | Freq: Two times a day (BID) | ORAL | 3 refills | Status: AC
  Filled 2023-11-24: qty 180, 90d supply, fill #0
  Filled 2024-02-09: qty 180, 90d supply, fill #1
  Filled 2024-04-12 – 2024-05-19 (×3): qty 180, 90d supply, fill #2

## 2023-10-02 ENCOUNTER — Other Ambulatory Visit (HOSPITAL_BASED_OUTPATIENT_CLINIC_OR_DEPARTMENT_OTHER): Payer: Self-pay

## 2023-10-02 DIAGNOSIS — I4819 Other persistent atrial fibrillation: Secondary | ICD-10-CM | POA: Diagnosis not present

## 2023-10-02 DIAGNOSIS — F411 Generalized anxiety disorder: Secondary | ICD-10-CM | POA: Diagnosis not present

## 2023-10-02 DIAGNOSIS — Z0000 Encounter for general adult medical examination without abnormal findings: Secondary | ICD-10-CM | POA: Diagnosis not present

## 2023-10-02 DIAGNOSIS — N1831 Chronic kidney disease, stage 3a: Secondary | ICD-10-CM | POA: Diagnosis not present

## 2023-10-02 DIAGNOSIS — D6869 Other thrombophilia: Secondary | ICD-10-CM | POA: Diagnosis not present

## 2023-10-02 DIAGNOSIS — R7303 Prediabetes: Secondary | ICD-10-CM | POA: Diagnosis not present

## 2023-10-02 DIAGNOSIS — D508 Other iron deficiency anemias: Secondary | ICD-10-CM | POA: Diagnosis not present

## 2023-10-02 DIAGNOSIS — E559 Vitamin D deficiency, unspecified: Secondary | ICD-10-CM | POA: Diagnosis not present

## 2023-10-02 DIAGNOSIS — I10 Essential (primary) hypertension: Secondary | ICD-10-CM | POA: Diagnosis not present

## 2023-10-02 DIAGNOSIS — E039 Hypothyroidism, unspecified: Secondary | ICD-10-CM | POA: Diagnosis not present

## 2023-10-02 DIAGNOSIS — E7800 Pure hypercholesterolemia, unspecified: Secondary | ICD-10-CM | POA: Diagnosis not present

## 2023-10-02 DIAGNOSIS — J45909 Unspecified asthma, uncomplicated: Secondary | ICD-10-CM | POA: Diagnosis not present

## 2023-10-02 LAB — LAB REPORT - SCANNED
A1c: 5.8
EGFR: 39

## 2023-10-02 MED ORDER — DOXYCYCLINE HYCLATE 100 MG PO CAPS
100.0000 mg | ORAL_CAPSULE | Freq: Two times a day (BID) | ORAL | 0 refills | Status: DC
  Filled 2023-10-02: qty 10, 5d supply, fill #0

## 2023-10-07 ENCOUNTER — Other Ambulatory Visit (HOSPITAL_BASED_OUTPATIENT_CLINIC_OR_DEPARTMENT_OTHER): Payer: Self-pay

## 2023-10-08 ENCOUNTER — Telehealth (HOSPITAL_COMMUNITY): Payer: Self-pay | Admitting: *Deleted

## 2023-10-08 NOTE — Telephone Encounter (Signed)
 Pt called and reported she is afib today HR 70-118 bp 136/82. She did report holding extra fluid  and took a extra lasix  today. She only feels the normal chest tightness that happens other then that a symptomatic.Discussed with Daril Kicks P.A. and the plan is to bring pt in 10/10/23 for appt. He ordered for her to take lasix  20mg  bid until seen.Pt verbalized understanding.

## 2023-10-10 ENCOUNTER — Ambulatory Visit (HOSPITAL_COMMUNITY)
Admission: RE | Admit: 2023-10-10 | Discharge: 2023-10-10 | Disposition: A | Discharge status: Home / Self Care | Source: Ambulatory Visit | Attending: Physician Assistant | Admitting: Physician Assistant

## 2023-10-10 ENCOUNTER — Other Ambulatory Visit (HOSPITAL_BASED_OUTPATIENT_CLINIC_OR_DEPARTMENT_OTHER): Payer: Self-pay

## 2023-10-10 ENCOUNTER — Other Ambulatory Visit (HOSPITAL_COMMUNITY): Payer: Self-pay

## 2023-10-10 VITALS — BP 96/54 | HR 89 | Ht 65.0 in | Wt 219.8 lb

## 2023-10-10 DIAGNOSIS — Z5181 Encounter for therapeutic drug level monitoring: Secondary | ICD-10-CM | POA: Diagnosis not present

## 2023-10-10 DIAGNOSIS — I4891 Unspecified atrial fibrillation: Secondary | ICD-10-CM | POA: Diagnosis not present

## 2023-10-10 DIAGNOSIS — Z79899 Other long term (current) drug therapy: Secondary | ICD-10-CM

## 2023-10-10 DIAGNOSIS — D6869 Other thrombophilia: Secondary | ICD-10-CM | POA: Diagnosis not present

## 2023-10-10 DIAGNOSIS — I4819 Other persistent atrial fibrillation: Secondary | ICD-10-CM | POA: Diagnosis not present

## 2023-10-10 DIAGNOSIS — I484 Atypical atrial flutter: Secondary | ICD-10-CM

## 2023-10-10 MED ORDER — AMIODARONE HCL 200 MG PO TABS
ORAL_TABLET | ORAL | 3 refills | Status: DC
  Filled 2023-10-10 – 2023-10-28 (×3): qty 60, 30d supply, fill #0

## 2023-10-10 NOTE — Progress Notes (Signed)
 Primary Care Physician: Leslie Lamarr RAMAN, MD Primary Cardiologist: Dr Leslie [name] Primary Electrophysiologist: Dr Leslie [name] Referring Physician: HeartCare triage   Leslie [name] is a 78 y.o. female with a history of CKD, HLD, HTN, hypothyroidism, mild-mod MR, mod AI, OSA, and atrial fibrillation who presents for consultation in the [facility].  The patient was initially diagnosed with atrial fibrillation 07/2016 and had DCCV at that time. Beta-blocker dose was decreased in May 2018 when she had junctional rhythm.  After she received her second dose of coronavirus vaccine in February 2021 she developed symptomatic persistent atrial fibrillation with RVR, again challenging to rate control and underwent cardioversion on June 24, 2019. Patient is on Eliquis  for stroke prevention.   She went back  into the afib 04/29/20  with RVR. She called into the answering service and spoke to Leslie [name]. He increased her dose of metoprolol  to 100 mg in  the pm (usual dose  100 mg  in am and 50 mg in pm) Her v range at home is 90's to 120. She feels she went into afib with worries over a sick cat and the weather. She  does feel fatigue and shortness of breath with activity in afib. She is compliant with CPAP, minimal caffeine, no alcohol, no tobacco. No missed anticoagulation. Has had all vaccines.  F/u successful cardioversion, 05/09/20. unfortunately she is back in afib with RVR. Dicussed antiarrythmic's with pt and with Dr. [name]. We discussed use of flecainide  as she does not want to use amiodarone  with her known thyroid  disease. She  will need a stress test which Dr. [name] will coordinate and then will get loaded on flecainide  with a low risk test and try to get cardioverted in a timely manner. Pt is in agreement. No missed anticoagulation. No conduction issues on ekg's in SR. EF was normal on latest echo.   F/u in afib clinic, 12/07/20, for f/u from Dr. [name] for flecainide  surveillance.  She has been staying in SR on flecainide . She has a h/o of migraines and feel that they may have increased around the time of starting flecainide  but cannot say for sure this is what is triggering them again. She was having some bradycardia buyt her drugs was tweaked by Dr. [name] in May  and this has improved.   Follow up in the AF clinic 07/26/22.  She presented to the ED on 04/09/2022 with shortness of breath, hypertensive urgency with SBP >200.  Echocardiogram showed EF 25 to 30%, severely decreased LV function, RWMA, mild LVH, normal RV systolic function, small pericardial effusion, moderate mitral valve regurgitation, moderate aortic valve regurgitation.  Troponin was elevated.  Cardiology was consulted and she underwent R/LHC on 04/11/2022 which revealed mild pulmonary hypertension, mild nonobstructive CAD, and ICM. Flecainide  and verapamil  were transitioned to amiodarone  and metoprolol  in the setting of reduced EF.  It was thought that her acute systolic heart failure was secondary to Takotsubo/stress-induced cardiomyopathy/type II MI in the setting of hypertensive urgency.  She was discharged home in stable condition on 04/12/2022. Echo 05/22/22 showed her EF returned to normal. This morning, patient had sudden onset tachypalpitations, her heart rates were up to 140s bpm. She called the clinic to make an appointment and took her morning medications. She converted to SR en route. There were no specific triggers that she could identify.   Follow up in the AF clinic 01/22/23. Patient reports that she has noticed high heart rate notifications on her smart watch.  These can be associated with high BP readings as well. She does have palpitations. The ECG strips from her watch show sinus tachycardia with rates in the low 100's. No bleeding issues on anticoagulation.   Follow up 08/14/23. Patient was seen in the ED 05/31/23 with symptomatic afib and underwent DCCV at that time. She was also worked up for significant  anemia requiring transfusion, no source found. Seen by Dr Leslie [name] and is scheduled for Watchman implant 10/2023. Patient reports that she went back into afib last Friday with symptoms of SOB on exertion and elevated heart rates. There were no specific triggers that she could identify.   Follow up 10/10/23. Patient returns for follow up for atrial fibrillation and amiodarone  monitoring. She reports that she went back into afib two days ago. She did have increased lower extremity edema which resolved with a couple extra doses of Lasix . There were no specific triggers that she could identify. She is SOB.   Today, she  denies symptoms of palpitations, chest pain, orthopnea, PND, lower extremity edema, dizziness, presyncope, syncope, snoring, daytime somnolence, bleeding, or neurologic sequela. The patient is tolerating medications without difficulties and is otherwise without complaint today.    Atrial Fibrillation Risk Factors:  she does have symptoms or diagnosis of sleep apnea. she does not have a history of rheumatic fever. she does not have a history of alcohol use. The patient does not have a history of early familial atrial fibrillation or other arrhythmias.   Atrial Fibrillation Management history:  Previous antiarrhythmic drugs: flecainide , amiodarone   Previous cardioversions: 08/01/2016, 06/24/19 Previous ablations: none Anticoagulation history: Eliquis    Past Medical History:  Diagnosis Date   Adenomatous polyp    Arthritis    knees, legs, fingers (07/30/2016)   Asthma    Bursitis of left shoulder    just finished PT (07/30/2016)   Chest pain    a. 2003 Abnl stress test-->Cath: nonobs CAD.   CHF (congestive heart failure) (HCC)    Chronic bronchitis (HCC)    Chronic kidney disease    stage 3   GERD (gastroesophageal reflux disease)    barrett's esophagus- Dr. Donnald   History of hiatal hernia    Hyperlipidemia    Hypertension    Hypothyroidism    Migraines     sporatic; at least a few/year (07/30/2016)   Mitral regurgitation    a. 07/2016 Echo: mild to mod MR;  b. 07/2016 TEE: mild MR.   Moderate aortic insufficiency    a. 07/2016 Echo: EF 55-60%, mod AI;  b. 07/2016 TEE: EF 50-55%, mod AI.   Obesity    s/p lap band surgery 6/10   OSA on CPAP    PAF (paroxysmal atrial fibrillation) (HCC)    a. 07/2016 TEE/DCCV: EF 50-55%, mild MR, mod AI, mild to mod TR, neg bubble study-->successful DCCV x1 w/ 120J.   PONV (postoperative nausea and vomiting)    Vitamin D deficiency     Current Outpatient Medications  Medication Sig Dispense Refill   acetaminophen  (TYLENOL ) 650 MG CR tablet Take 1,300 mg by mouth at bedtime as needed for pain.     amiodarone  (PACERONE ) 200 MG tablet Take 1 tablet (200 mg total) by mouth daily. 90 tablet 3   apixaban  (ELIQUIS ) 5 MG TABS tablet Take 1 tablet (5 mg total) by mouth 2 (two) times daily. 180 tablet 1   atorvastatin  (LIPITOR) 40 MG tablet Take 1 tablet (40 mg total) by mouth in the morning. 90 tablet 3  Bacillus Coagulans-Inulin (ALIGN PREBIOTIC-PROBIOTIC PO) Take 2 capsules by mouth daily.     Calcium  Carb-Cholecalciferol (CALCIUM  + D3 PO) Take 1 tablet by mouth daily.     carvedilol  (COREG ) 25 MG tablet Take 1 tablet (25 mg total) by mouth 2 (two) times daily. 180 tablet 3   cholecalciferol (VITAMIN D) 1000 UNITS tablet Take 1,000 Units by mouth daily.     diclofenac Sodium (VOLTAREN) 1 % GEL Apply 1 application topically 2 (two) times daily.     fexofenadine (ALLEGRA) 180 MG tablet Take 180 mg by mouth daily.     fluticasone  (FLONASE ) 50 MCG/ACT nasal spray Place 2 sprays into both nostrils at bedtime.      fluticasone  (FLONASE ) 50 MCG/ACT nasal spray Place 2 sprays into both nostrils daily. 16 g 5   Fluticasone  Furoate (ARNUITY ELLIPTA ) 50 MCG/ACT AEPB Inhale 1 puff by mouth daily. 90 each 3   furosemide  (LASIX ) 20 MG tablet Take 1 tablet (20 mg) daily. May take an additional 1 tablet (20 mg) on the days of increase  swelling, weight gain of 3 lb overnight or 5 lb in 1 week. 180 tablet 3   Glucosamine-Chondroitin (OSTEO BI-FLEX REGULAR STRENGTH PO) Take 1 tablet by mouth daily.     ketoconazole (NIZORAL) 2 % cream 1 application to affected area Externally Once a day as needed; Duration: 30 days     levothyroxine  (SYNTHROID ) 75 MCG tablet Take 1 tablet (75 mcg total) by mouth every morning on an empty stomach. 90 tablet 0   magnesium  oxide (MAG-OX) 400 (240 Mg) MG tablet Take 1 tablet (400 mg total) by mouth daily. 120 tablet 1   meclizine  (ANTIVERT ) 25 MG tablet Take 25 mg by mouth as needed.     metroNIDAZOLE  (METROCREAM ) 0.75 % cream Apply a small ammount topically to face 2 (two) times daily. May decrease to once daily as tolerated 45 g 3   mometasone (ELOCON) 0.1 % cream Apply 1 application  topically 2 (two) times daily as needed (skin irritation).     Multiple Vitamins-Minerals (CENTRUM MULTIGUMMIES) CHEW Takes 1 gummy by mouth daily     nystatin (MYCOSTATIN/NYSTOP) powder Apply 1 Application topically as needed.     omeprazole  (PRILOSEC) 20 MG capsule Take 2 capsules (40 mg total) by mouth daily. 180 capsule 0   potassium chloride  SA (KLOR-CON  M) 20 MEQ tablet Take 1 tablet (20 mEq total) by mouth 2 (two) times daily. 60 tablet 2   potassium chloride  SA (KLOR-CON  M) 20 MEQ tablet Take 1 tablet (20 mEq total) by mouth 2 (two) times daily. 180 tablet 3   sertraline  (ZOLOFT ) 25 MG tablet Take 1 tablet (25 mg total) by mouth daily. 90 tablet 3   No current facility-administered medications for this encounter.    ROS- All systems are reviewed and negative except as per the HPI above.  Physical Exam: Vitals:   10/10/23 1015  BP: (!) 96/54  Pulse: 89  Weight: 99.7 kg  Height: 5' 5 (1.651 m)    GEN: Well nourished, well developed in no acute distress NECK: No JVD CARDIAC: Irregularly irregular rate and rhythm, no murmurs, rubs, gallops RESPIRATORY:  Clear to auscultation without rales, wheezing or  rhonchi  ABDOMEN: Soft, non-tender, non-distended EXTREMITIES:  No edema; No deformity    Wt Readings from Last 3 Encounters:  10/10/23 99.7 kg  09/09/23 102.4 kg  09/01/23 100.7 kg    EKG today demonstrates  Atrial flutter with variable block Vent. rate 89 BPM PR  interval * ms QRS duration 80 ms QT/QTcB 370/450 ms   Echo 06/23/23 demonstrated   1. Left ventricular ejection fraction, by estimation, is 55 to 60%. Left  ventricular ejection fraction by 3D volume is 57 %. The left ventricle has  normal function. The left ventricle has no regional wall motion  abnormalities. Left ventricular diastolic   parameters are consistent with Grade II diastolic dysfunction  (pseudonormalization).   2. Right ventricular systolic function is normal. The right ventricular  size is normal. There is normal pulmonary artery systolic pressure. The  estimated right ventricular systolic pressure is 21.7 mmHg.   3. Left atrial size was severely dilated.   4. Right atrial size was severely dilated.   5. The mitral valve is abnormal. Severe mitral valve regurgitation. No  evidence of mitral stenosis.   6. Tricuspid valve regurgitation is moderate.   7. The aortic valve is tricuspid. Aortic valve regurgitation is mild to  moderate. Aortic valve sclerosis is present, with no evidence of aortic  valve stenosis.   8. The inferior vena cava is normal in size with greater than 50%  respiratory variability, suggesting right atrial pressure of 3 mmHg.   Comparison(s): Prior images reviewed side by side. The left ventricular  function is unchanged. Mitral insufficiency appears worse.   Epic records are reviewed at length today  CHA2DS2-VASc Score = 5  The patient's score is based upon: CHF History: 1 HTN History: 1 Diabetes History: 0 Stroke History: 0 Vascular Disease History: 0 Age Score: 2 Gender Score: 1       ASSESSMENT AND PLAN: Persistent Atrial Fibrillation (ICD10:  I48.19) The  patient's CHA2DS2-VASc score is 5, indicating a 7.2% annual risk of stroke.   Patient back in rate controlled afib, symptomatic We discussed rhythm control options today, also d/w Dr Leslie [name].  Increase amiodarone  to 200 mg BID and plan for repeat DCCV. Will have patient follow up post DCCV with Dr Leslie [name] to discuss ablation and timing of Watchman.  Recent labs for DCCV are in scanned document from 10/02/23. Continue Eliquis  5 mg BID Continue carvedilol  25 mg BID  Secondary Hypercoagulable State (ICD10:  D68.69) The patient is at significant risk for stroke/thromboembolism based upon her CHA2DS2-VASc Score of 5.  Continue Apixaban  (Eliquis ). Scheduled for Watchman 11/13/23.  High Risk Medication Monitoring (ICD 10: Z79.899) Intervals on ECG acceptable for amiodarone  monitoring.   Obesity Body mass index is 36.58 kg/m.  Encouraged lifestyle modification S/p lapband 2010   OSA  Encouraged nightly CPAP  HTN Stable on current regimen  Stress cardiomyopathy  EF 55-60% GDMT per primary cardiology team Fluid status appears stable today  [facility]HD TEE 09/01/23 showed moderate MR Followed by Dr Leslie [name]    Follow up with Dr Leslie [name] post DCCV.    Informed Consent   Shared Decision Making/Informed Consent The risks (stroke, cardiac arrhythmias rarely resulting in the need for a temporary or permanent pacemaker, skin irritation or burns and complications associated with conscious sedation including aspiration, arrhythmia, respiratory failure and death), benefits (restoration of normal sinus rhythm) and alternatives of a direct current cardioversion were explained in detail to Ms. Pollard and she agrees to proceed.         Daril Kicks PA-C Afib Clinic [facility] [address] [place], [place] [address] [phone]

## 2023-10-10 NOTE — Patient Instructions (Addendum)
 Increase amiodarone  to 200 mg twice a day until see Dr. Cindie   Cardioversion scheduled for: June 30 Monday    - Arrive at the [employer] [facility] ([address])  and check in with ADMITTING at 12:00 pm    - Do not eat or drink anything after midnight the night prior to your procedure.   - Take all your morning medication (except diabetic medications) with a sip of water prior to arrival.  - Do NOT miss any doses of your blood thinner - if you should miss a dose or take a dose more than 4 hours late -- please notify our office immediately.  - You will not be able to drive home after your procedure. Please ensure you have a responsible adult to drive you home. You will need someone with you for 24 hours post procedure.     - Expect to be in the procedural area approximately 2 hours.   - If you feel as if you go back into normal rhythm prior to scheduled cardioversion, please notify our office immediately.   If your procedure is canceled in the cardioversion suite you will be charged a cancellation fee.

## 2023-10-10 NOTE — H&P (View-Only) (Signed)
 Primary Care Physician: Leslie Lamarr RAMAN, MD Primary Cardiologist: Dr Leslie [name] Primary Electrophysiologist: Dr Leslie [name] Referring Physician: [facility] triage   Leslie [name] is a 78 y.o. female with a history of CKD, HLD, HTN, hypothyroidism, mild-mod MR, mod AI, OSA, and atrial fibrillation who presents for consultation in the [facility].  The patient was initially diagnosed with atrial fibrillation 07/2016 and had DCCV at that time. Beta-blocker dose was decreased in May 2018 when she had junctional rhythm.  After she received her second dose of coronavirus vaccine in February 2021 she developed symptomatic persistent atrial fibrillation with RVR, again challenging to rate control and underwent cardioversion on June 24, 2019. Patient is on Eliquis  for stroke prevention.   She went back  into the afib 04/29/20  with RVR. She called into the answering service and spoke to Leslie [name], Leslie [name]. He increased her dose of metoprolol  to 100 mg in  the pm (usual dose  100 mg  in am and 50 mg in pm) Her v range at home is 90's to 120. She feels she went into afib with worries over a sick cat and the weather. She  does feel fatigue and shortness of breath with activity in afib. She is compliant with CPAP, minimal caffeine, no alcohol, no tobacco. No missed anticoagulation. Has had all vaccines.  F/u successful cardioversion, 05/09/20. unfortunately she is back in afib with RVR. Dicussed antiarrythmic's with pt and with Dr. [name]. We discussed use of flecainide  as she does not want to use amiodarone  with her known thyroid  disease. She  will need a stress test which Dr. [name] will coordinate and then will get loaded on flecainide  with a low risk test and try to get cardioverted in a timely manner. Pt is in agreement. No missed anticoagulation. No conduction issues on ekg's in SR. EF was normal on latest echo.   F/u in afib clinic, 12/07/20, for f/u from Dr. [name] for flecainide  surveillance.  She has been staying in SR on flecainide . She has a h/o of migraines and feel that they may have increased around the time of starting flecainide  but cannot say for sure this is what is triggering them again. She was having some bradycardia buyt her drugs was tweaked by Dr. [name] in May  and this has improved.   Follow up in the AF clinic 07/26/22.  She presented to the ED on 04/09/2022 with shortness of breath, hypertensive urgency with SBP >200.  Echocardiogram showed EF 25 to 30%, severely decreased LV function, RWMA, mild LVH, normal RV systolic function, small pericardial effusion, moderate mitral valve regurgitation, moderate aortic valve regurgitation.  Troponin was elevated.  Cardiology was consulted and she underwent R/LHC on 04/11/2022 which revealed mild pulmonary hypertension, mild nonobstructive CAD, and ICM. Flecainide  and verapamil  were transitioned to amiodarone  and metoprolol  in the setting of reduced EF.  It was thought that her acute systolic heart failure was secondary to Takotsubo/stress-induced cardiomyopathy/type II MI in the setting of hypertensive urgency.  She was discharged home in stable condition on 04/12/2022. Echo 05/22/22 showed her EF returned to normal. This morning, patient had sudden onset tachypalpitations, her heart rates were up to 140s bpm. She called the clinic to make an appointment and took her morning medications. She converted to SR en route. There were no specific triggers that she could identify.   Follow up in the AF clinic 01/22/23. Patient reports that she has noticed high heart rate notifications on her smart watch.  These can be associated with high BP readings as well. She does have palpitations. The ECG strips from her watch show sinus tachycardia with rates in the low 100's. No bleeding issues on anticoagulation.   Follow up 08/14/23. Patient was seen in the ED 05/31/23 with symptomatic afib and underwent DCCV at that time. She was also worked up for significant  anemia requiring transfusion, no source found. Seen by Dr Leslie [name] and is scheduled for Watchman implant 10/2023. Patient reports that she went back into afib last Friday with symptoms of SOB on exertion and elevated heart rates. There were no specific triggers that she could identify.   Follow up 10/10/23. Patient returns for follow up for atrial fibrillation and amiodarone  monitoring. She reports that she went back into afib two days ago. She did have increased lower extremity edema which resolved with a couple extra doses of Lasix . There were no specific triggers that she could identify. She is SOB.   Today, she  denies symptoms of palpitations, chest pain, orthopnea, PND, lower extremity edema, dizziness, presyncope, syncope, snoring, daytime somnolence, bleeding, or neurologic sequela. The patient is tolerating medications without difficulties and is otherwise without complaint today.    Atrial Fibrillation Risk Factors:  she does have symptoms or diagnosis of sleep apnea. she does not have a history of rheumatic fever. she does not have a history of alcohol use. The patient does not have a history of early familial atrial fibrillation or other arrhythmias.   Atrial Fibrillation Management history:  Previous antiarrhythmic drugs: flecainide , amiodarone   Previous cardioversions: 08/01/2016, 06/24/19 Previous ablations: none Anticoagulation history: Eliquis    Past Medical History:  Diagnosis Date   Adenomatous polyp    Arthritis    knees, legs, fingers (07/30/2016)   Asthma    Bursitis of left shoulder    just finished PT (07/30/2016)   Chest pain    a. 2003 Abnl stress test-->Cath: nonobs CAD.   CHF (congestive heart failure) (HCC)    Chronic bronchitis (HCC)    Chronic kidney disease    stage 3   GERD (gastroesophageal reflux disease)    barrett's esophagus- Dr. Donnald   History of hiatal hernia    Hyperlipidemia    Hypertension    Hypothyroidism    Migraines     sporatic; at least a few/year (07/30/2016)   Mitral regurgitation    a. 07/2016 Echo: mild to mod MR;  b. 07/2016 TEE: mild MR.   Moderate aortic insufficiency    a. 07/2016 Echo: EF 55-60%, mod AI;  b. 07/2016 TEE: EF 50-55%, mod AI.   Obesity    s/p lap band surgery 6/10   OSA on CPAP    PAF (paroxysmal atrial fibrillation) (HCC)    a. 07/2016 TEE/DCCV: EF 50-55%, mild MR, mod AI, mild to mod TR, neg bubble study-->successful DCCV x1 w/ 120J.   PONV (postoperative nausea and vomiting)    Vitamin D deficiency     Current Outpatient Medications  Medication Sig Dispense Refill   acetaminophen  (TYLENOL ) 650 MG CR tablet Take 1,300 mg by mouth at bedtime as needed for pain.     amiodarone  (PACERONE ) 200 MG tablet Take 1 tablet (200 mg total) by mouth daily. 90 tablet 3   apixaban  (ELIQUIS ) 5 MG TABS tablet Take 1 tablet (5 mg total) by mouth 2 (two) times daily. 180 tablet 1   atorvastatin  (LIPITOR) 40 MG tablet Take 1 tablet (40 mg total) by mouth in the morning. 90 tablet 3  Bacillus Coagulans-Inulin (ALIGN PREBIOTIC-PROBIOTIC PO) Take 2 capsules by mouth daily.     Calcium  Carb-Cholecalciferol (CALCIUM  + D3 PO) Take 1 tablet by mouth daily.     carvedilol  (COREG ) 25 MG tablet Take 1 tablet (25 mg total) by mouth 2 (two) times daily. 180 tablet 3   cholecalciferol (VITAMIN D) 1000 UNITS tablet Take 1,000 Units by mouth daily.     diclofenac Sodium (VOLTAREN) 1 % GEL Apply 1 application topically 2 (two) times daily.     fexofenadine (ALLEGRA) 180 MG tablet Take 180 mg by mouth daily.     fluticasone  (FLONASE ) 50 MCG/ACT nasal spray Place 2 sprays into both nostrils at bedtime.      fluticasone  (FLONASE ) 50 MCG/ACT nasal spray Place 2 sprays into both nostrils daily. 16 g 5   Fluticasone  Furoate (ARNUITY ELLIPTA ) 50 MCG/ACT AEPB Inhale 1 puff by mouth daily. 90 each 3   furosemide  (LASIX ) 20 MG tablet Take 1 tablet (20 mg) daily. May take an additional 1 tablet (20 mg) on the days of increase  swelling, weight gain of 3 lb overnight or 5 lb in 1 week. 180 tablet 3   Glucosamine-Chondroitin (OSTEO BI-FLEX REGULAR STRENGTH PO) Take 1 tablet by mouth daily.     ketoconazole (NIZORAL) 2 % cream 1 application to affected area Externally Once a day as needed; Duration: 30 days     levothyroxine  (SYNTHROID ) 75 MCG tablet Take 1 tablet (75 mcg total) by mouth every morning on an empty stomach. 90 tablet 0   magnesium  oxide (MAG-OX) 400 (240 Mg) MG tablet Take 1 tablet (400 mg total) by mouth daily. 120 tablet 1   meclizine  (ANTIVERT ) 25 MG tablet Take 25 mg by mouth as needed.     metroNIDAZOLE  (METROCREAM ) 0.75 % cream Apply a small ammount topically to face 2 (two) times daily. May decrease to once daily as tolerated 45 g 3   mometasone (ELOCON) 0.1 % cream Apply 1 application  topically 2 (two) times daily as needed (skin irritation).     Multiple Vitamins-Minerals (CENTRUM MULTIGUMMIES) CHEW Takes 1 gummy by mouth daily     nystatin (MYCOSTATIN/NYSTOP) powder Apply 1 Application topically as needed.     omeprazole  (PRILOSEC) 20 MG capsule Take 2 capsules (40 mg total) by mouth daily. 180 capsule 0   potassium chloride  SA (KLOR-CON  M) 20 MEQ tablet Take 1 tablet (20 mEq total) by mouth 2 (two) times daily. 60 tablet 2   potassium chloride  SA (KLOR-CON  M) 20 MEQ tablet Take 1 tablet (20 mEq total) by mouth 2 (two) times daily. 180 tablet 3   sertraline  (ZOLOFT ) 25 MG tablet Take 1 tablet (25 mg total) by mouth daily. 90 tablet 3   No current facility-administered medications for this encounter.    ROS- All systems are reviewed and negative except as per the HPI above.  Physical Exam: Vitals:   10/10/23 1015  BP: (!) 96/54  Pulse: 89  Weight: 99.7 kg  Height: 5' 5 (1.651 m)    GEN: Well nourished, well developed in no acute distress NECK: No JVD CARDIAC: Irregularly irregular rate and rhythm, no murmurs, rubs, gallops RESPIRATORY:  Clear to auscultation without rales, wheezing or  rhonchi  ABDOMEN: Soft, non-tender, non-distended EXTREMITIES:  No edema; No deformity    Wt Readings from Last 3 Encounters:  10/10/23 99.7 kg  09/09/23 102.4 kg  09/01/23 100.7 kg    EKG today demonstrates  Atrial flutter with variable block Vent. rate 89 BPM PR  interval * ms QRS duration 80 ms QT/QTcB 370/450 ms   Echo 06/23/23 demonstrated   1. Left ventricular ejection fraction, by estimation, is 55 to 60%. Left  ventricular ejection fraction by 3D volume is 57 %. The left ventricle has  normal function. The left ventricle has no regional wall motion  abnormalities. Left ventricular diastolic   parameters are consistent with Grade II diastolic dysfunction  (pseudonormalization).   2. Right ventricular systolic function is normal. The right ventricular  size is normal. There is normal pulmonary artery systolic pressure. The  estimated right ventricular systolic pressure is 21.7 mmHg.   3. Left atrial size was severely dilated.   4. Right atrial size was severely dilated.   5. The mitral valve is abnormal. Severe mitral valve regurgitation. No  evidence of mitral stenosis.   6. Tricuspid valve regurgitation is moderate.   7. The aortic valve is tricuspid. Aortic valve regurgitation is mild to  moderate. Aortic valve sclerosis is present, with no evidence of aortic  valve stenosis.   8. The inferior vena cava is normal in size with greater than 50%  respiratory variability, suggesting right atrial pressure of 3 mmHg.   Comparison(s): Prior images reviewed side by side. The left ventricular  function is unchanged. Mitral insufficiency appears worse.   Epic records are reviewed at length today  CHA2DS2-VASc Score = 5  The patient's score is based upon: CHF History: 1 HTN History: 1 Diabetes History: 0 Stroke History: 0 Vascular Disease History: 0 Age Score: 2 Gender Score: 1       ASSESSMENT AND PLAN: Persistent Atrial Fibrillation (ICD10:  I48.19) The  patient's CHA2DS2-VASc score is 5, indicating a 7.2% annual risk of stroke.   Patient back in rate controlled afib, symptomatic We discussed rhythm control options today, also d/w Dr Leslie [name].  Increase amiodarone  to 200 mg BID and plan for repeat DCCV. Will have patient follow up post DCCV with Dr Leslie [name] to discuss ablation and timing of Watchman.  Recent labs for DCCV are in scanned document from 10/02/23. Continue Eliquis  5 mg BID Continue carvedilol  25 mg BID  Secondary Hypercoagulable State (ICD10:  D68.69) The patient is at significant risk for stroke/thromboembolism based upon her CHA2DS2-VASc Score of 5.  Continue Apixaban  (Eliquis ). Scheduled for Watchman 11/13/23.  High Risk Medication Monitoring (ICD 10: Z79.899) Intervals on ECG acceptable for amiodarone  monitoring.   Obesity Body mass index is 36.58 kg/m.  Encouraged lifestyle modification S/p lapband 2010   OSA  Encouraged nightly CPAP  HTN Stable on current regimen  Stress cardiomyopathy  EF 55-60% GDMT per primary cardiology team Fluid status appears stable today  VHD TEE 09/01/23 showed moderate MR Followed by Dr Leslie [name]    Follow up with Dr Leslie [name] post DCCV.    Informed Consent   Shared Decision Making/Informed Consent The risks (stroke, cardiac arrhythmias rarely resulting in the need for a temporary or permanent pacemaker, skin irritation or burns and complications associated with conscious sedation including aspiration, arrhythmia, respiratory failure and death), benefits (restoration of normal sinus rhythm) and alternatives of a direct current cardioversion were explained in detail to Ms. Pollard and she agrees to proceed.         Daril Kicks PA-C Afib Clinic [facility] [address] [place], [place] [address] [phone]

## 2023-10-10 NOTE — Progress Notes (Signed)
 Pt called for pre procedure instructions. Arrival time 1130 NPO after midnight explained Instructed to take am meds with sip of water and confirmed blood thinner consistency, Eliquis . Instructed pt need for ride home tomorrow and have responsible adult with them for 24 hrs post procedure.

## 2023-10-13 ENCOUNTER — Other Ambulatory Visit: Payer: Self-pay

## 2023-10-13 ENCOUNTER — Ambulatory Visit (HOSPITAL_COMMUNITY): Admitting: Anesthesiology

## 2023-10-13 ENCOUNTER — Ambulatory Visit (HOSPITAL_COMMUNITY)
Admission: RE | Admit: 2023-10-13 | Discharge: 2023-10-13 | Disposition: A | Discharge status: Home / Self Care | Attending: Internal Medicine | Admitting: Internal Medicine

## 2023-10-13 ENCOUNTER — Encounter (HOSPITAL_COMMUNITY): Payer: Self-pay | Admitting: Internal Medicine

## 2023-10-13 ENCOUNTER — Encounter (HOSPITAL_COMMUNITY)
Admission: RE | Disposition: A | Discharge status: Home / Self Care | Payer: Self-pay | Source: Home / Self Care | Attending: Internal Medicine

## 2023-10-13 DIAGNOSIS — Z79899 Other long term (current) drug therapy: Secondary | ICD-10-CM | POA: Diagnosis not present

## 2023-10-13 DIAGNOSIS — D6869 Other thrombophilia: Secondary | ICD-10-CM | POA: Insufficient documentation

## 2023-10-13 DIAGNOSIS — G4733 Obstructive sleep apnea (adult) (pediatric): Secondary | ICD-10-CM | POA: Insufficient documentation

## 2023-10-13 DIAGNOSIS — N189 Chronic kidney disease, unspecified: Secondary | ICD-10-CM | POA: Insufficient documentation

## 2023-10-13 DIAGNOSIS — Z9884 Bariatric surgery status: Secondary | ICD-10-CM | POA: Insufficient documentation

## 2023-10-13 DIAGNOSIS — N1831 Chronic kidney disease, stage 3a: Secondary | ICD-10-CM | POA: Diagnosis not present

## 2023-10-13 DIAGNOSIS — I4891 Unspecified atrial fibrillation: Secondary | ICD-10-CM | POA: Diagnosis not present

## 2023-10-13 DIAGNOSIS — I5043 Acute on chronic combined systolic (congestive) and diastolic (congestive) heart failure: Secondary | ICD-10-CM | POA: Diagnosis not present

## 2023-10-13 DIAGNOSIS — Z87891 Personal history of nicotine dependence: Secondary | ICD-10-CM | POA: Insufficient documentation

## 2023-10-13 DIAGNOSIS — I4819 Other persistent atrial fibrillation: Secondary | ICD-10-CM | POA: Diagnosis not present

## 2023-10-13 DIAGNOSIS — K219 Gastro-esophageal reflux disease without esophagitis: Secondary | ICD-10-CM | POA: Insufficient documentation

## 2023-10-13 DIAGNOSIS — I509 Heart failure, unspecified: Secondary | ICD-10-CM | POA: Insufficient documentation

## 2023-10-13 DIAGNOSIS — Z7901 Long term (current) use of anticoagulants: Secondary | ICD-10-CM | POA: Insufficient documentation

## 2023-10-13 DIAGNOSIS — I130 Hypertensive heart and chronic kidney disease with heart failure and stage 1 through stage 4 chronic kidney disease, or unspecified chronic kidney disease: Secondary | ICD-10-CM | POA: Insufficient documentation

## 2023-10-13 DIAGNOSIS — I10 Essential (primary) hypertension: Secondary | ICD-10-CM | POA: Diagnosis not present

## 2023-10-13 SURGERY — CARDIOVERSION (CATH LAB)
Anesthesia: General

## 2023-10-13 MED ORDER — PROPOFOL 10 MG/ML IV BOLUS
INTRAVENOUS | Status: DC | PRN
  Administered 2023-10-13: 80 mg via INTRAVENOUS

## 2023-10-13 MED ORDER — SODIUM CHLORIDE 0.9% FLUSH: 3.0000 mL | Freq: Two times a day (BID) | INTRAVENOUS | Status: DC

## 2023-10-13 MED ORDER — SODIUM CHLORIDE 0.9% FLUSH
INTRAVENOUS | Status: DC | PRN
Start: 2023-10-13 — End: 2023-10-13
  Administered 2023-10-13: 10 mL via INTRAVENOUS

## 2023-10-13 MED ORDER — LIDOCAINE 2% (20 MG/ML) 5 ML SYRINGE
INTRAMUSCULAR | Status: DC | PRN
Start: 2023-10-13 — End: 2023-10-13
  Administered 2023-10-13: 100 mg via INTRAVENOUS

## 2023-10-13 MED ORDER — SODIUM CHLORIDE 0.9% FLUSH: 3.0000 mL | INTRAVENOUS | Status: DC | PRN

## 2023-10-13 SURGICAL SUPPLY — 1 items: PAD DEFIB RADIO PHYSIO CONN (PAD) ×2 IMPLANT

## 2023-10-13 NOTE — Anesthesia Preprocedure Evaluation (Signed)
 Anesthesia Evaluation  Patient identified by MRN, date of birth, ID band Patient awake    Reviewed: Allergy & Precautions, NPO status , Patient's Chart, lab work & pertinent test results, reviewed documented beta blocker date and time   History of Anesthesia Complications (+) PONV and history of anesthetic complications  Airway Mallampati: I  TM Distance: >3 FB Neck ROM: Full    Dental  (+) Dental Advisory Given   Pulmonary sleep apnea and Continuous Positive Airway Pressure Ventilation , former smoker   breath sounds clear to auscultation       Cardiovascular hypertension, Pt. on medications and Pt. on home beta blockers (-) angina + dysrhythmias Atrial Fibrillation + Valvular Problems/Murmurs (severe) MR  Rhythm:Irregular Rate:Normal  06/2023 ECHO: EF 55 to 60%. 1. LV EF 57 %. The left ventricle has normal function, no regional wall motion  abnormalities. Grade II diastolic dysfunction (pseudonormalization).   2. RVF is normal. The right ventricular size is normal. There is normal pulmonary artery systolic pressure. The estimated right ventricular systolic pressure is 21.7 mmHg.   3. Left atrial size was severely dilated.   4. Right atrial size was severely dilated.   5. The mitral valve is abnormal. Severe mitral valve regurgitation. No evidence of mitral stenosis.   6. Tricuspid valve regurgitation is moderate.   7. The aortic valve is tricuspid. Aortic valve regurgitation is mild to moderate. Aortic valve sclerosis is present, with no evidence of aortic valve stenosis.     Neuro/Psych  Headaches    GI/Hepatic Neg liver ROS, hiatal hernia,GERD  Medicated and Controlled,,  Endo/Other  Hypothyroidism    Renal/GU Renal InsufficiencyRenal disease     Musculoskeletal  (+) Arthritis ,    Abdominal  (+) + obese  Peds  Hematology eliquis    Anesthesia Other Findings   Reproductive/Obstetrics                              Anesthesia Physical Anesthesia Plan  ASA: 3  Anesthesia Plan: General   Post-op Pain Management: Minimal or no pain anticipated   Induction: Intravenous  PONV Risk Score and Plan: 4 or greater and Treatment may vary due to age or medical condition and Propofol  infusion  Airway Management Planned: Mask  Additional Equipment: None  Intra-op Plan:   Post-operative Plan:   Informed Consent: I have reviewed the patients History and Physical, chart, labs and discussed the procedure including the risks, benefits and alternatives for the proposed anesthesia with the patient or authorized representative who has indicated his/her understanding and acceptance.     Dental advisory given  Plan Discussed with: CRNA and Surgeon  Anesthesia Plan Comments:        Anesthesia Quick Evaluation

## 2023-10-13 NOTE — CV Procedure (Signed)
    Electrical Cardioversion Procedure Note Leslie [name] [id number] 1945-07-18  Procedure: Electrical Cardioversion Indications:  Atrial Fibrillation  Time Out: Verified patient identification, verified procedure,medications/allergies/relevent history reviewed, required imaging and test results available.  Performed  Procedure Details  The patient was NPO after midnight. Anesthesia was administered at the beside  by Dr.Miller.  Cardioversion was done with synchronized biphasic defibrillation with AP pads with 200 Joules.  The patient converted to sinus bradycardia. The patient tolerated the procedure well.  IMPRESSION:  Successful cardioversion of atrial fibrillation.   Leslie Leavens, MD FASE [facility] Cardiologist [facility]  [address] [place], [address] [place], [place] [address] [phone]  1:48 PM

## 2023-10-13 NOTE — Interval H&P Note (Signed)
 History and Physical Interval Note:  10/13/2023 12:25 PM  Leslie [name]  has presented today for surgery, with the diagnosis of AFIB.  The various methods of treatment have been discussed with the patient and family. After consideration of risks, benefits and other options for treatment, the patient has consented to  Procedure(s): CARDIOVERSION (N/A) as a surgical intervention.  The patient's history has been reviewed, patient examined, no change in status, stable for surgery.  I have reviewed the patient's chart and labs.  Questions were answered to the patient's satisfaction.     [name] A Fey Coghill

## 2023-10-13 NOTE — Transfer of Care (Signed)
 Immediate Anesthesia Transfer of Care Note  Patient: Leslie [name]  Procedure(s) Performed: CARDIOVERSION  Patient Location: PACU and Cath Lab  Anesthesia Type:MAC  Level of Consciousness: drowsy  Airway & Oxygen Therapy: Patient Spontanous Breathing and Patient connected to face mask oxygen  Post-op Assessment: Report given to RN and Post -op Vital signs reviewed and stable  Post vital signs: Reviewed and stable  Last Vitals:  Vitals Value Taken Time  BP 116/75 10/13/23 1333  Temp    Pulse 58 10/13/23 1333  Resp 15 10/13/23 1333  SpO2 100% 10/13/23 1333    Last Pain:  Vitals:   10/13/23 1322  TempSrc:   PainSc: 0-No pain         Complications: No notable events documented.

## 2023-10-14 ENCOUNTER — Encounter (HOSPITAL_COMMUNITY): Payer: Self-pay | Admitting: Internal Medicine

## 2023-10-14 NOTE — Anesthesia Postprocedure Evaluation (Signed)
 Anesthesia Post Note  Patient: Leslie [name]  Procedure(s) Performed: CARDIOVERSION     Patient location during evaluation: PACU Anesthesia Type: General Level of consciousness: awake and alert Pain management: pain level controlled Vital Signs Assessment: post-procedure vital signs reviewed and stable Respiratory status: spontaneous breathing, nonlabored ventilation and respiratory function stable Cardiovascular status: blood pressure returned to baseline and stable Postop Assessment: no apparent nausea or vomiting Anesthetic complications: no   No notable events documented.  Last Vitals:  Vitals:   10/13/23 1335 10/13/23 1345  BP: 116/75 (!) 109/53  Pulse: (!) 56 (!) 57  Resp: 15 10  Temp:    SpO2: 100% 98%    Last Pain:  Vitals:   10/13/23 1335  TempSrc:   PainSc: 0-No pain                 Butler Levander Pinal

## 2023-10-21 ENCOUNTER — Telehealth: Payer: Self-pay | Admitting: *Deleted

## 2023-10-21 DIAGNOSIS — K623 Rectal prolapse: Secondary | ICD-10-CM | POA: Diagnosis not present

## 2023-10-21 NOTE — Telephone Encounter (Signed)
   Name: Leslie [name]  DOB: 29-Mar-1946  MRN: [id number]  Primary Cardiologist: Jerel Balding, MD  Chart reviewed as part of pre-operative protocol coverage. The patient has an upcoming visit scheduled with Dr. Cindie on 10/29/2023 at which time clearance can be addressed in case there are any issues that would impact surgical recommendations.  Robotic assisted rectopexy is not scheduled until TBD as below. I added preop FYI to appointment note so that provider is aware to address at time of outpatient visit.  Per office protocol the cardiology provider should forward their finalized clearance decision and recommendations regarding antiplatelet therapy to the requesting party below.    This message will also be routed to pharmacy pool for input on holding Eliquis  as requested below so that this information is available to the clearing provider at time of patient's appointment.   I will route this message as FYI to requesting party and remove this message from the preop box as separate preop APP input not needed at this time.   Please call with any questions.  Damien [name] Braver, NP  10/21/2023, 1:16 PM

## 2023-10-21 NOTE — Telephone Encounter (Signed)
   Pre-operative Risk Assessment    Patient Name: Leslie [name]  DOB: 1945-10-17 MRN: [id number]   Date of last office visit: 09/09/23 DR. CROITORU Date of next office visit: 10/29/23 DR. LAMBERT S/P DCCV F/U; 12/29/23 DAPHNE BARRACK, NP; 03/09/24 DR. CROITORU   Request for Surgical Clearance    Procedure:  ROBOTIC ASSIST RECTOPEXY  Date of Surgery:  Clearance TBD                                Surgeon:  DR. BERNARDA NED Surgeon's Group or Practice Name:  [facility] Phone number:  [phone] Fax number:  [phone] BURNARD CRETE, LPN   Type of Clearance Requested:   - Medical  - Pharmacy:  Hold Apixaban  (Eliquis )     Type of Anesthesia:  General    Additional requests/questions:    Bonney Niels Jest   10/21/2023, 12:24 PM

## 2023-10-23 NOTE — Telephone Encounter (Signed)
 Patient with diagnosis of afib on Eliquis  for anticoagulation.    Procedure: ROBOTIC ASSIST RECTOPEXY  Date of procedure: TBD    CHA2DS2-VASc Score = 5   This indicates a 7.2% annual risk of stroke. The patient's score is based upon: CHF History: 1 HTN History: 1 Diabetes History: 0 Stroke History: 0 Vascular Disease History: 0 Age Score: 2 Gender Score: 1     CrCl 46 mL/min  Platelet count 228  Patient has had  DCCV within the last 30 days- on 10/13/23. Need uninterrupted anticoagulation till July 30,2025   Per office protocol, patient can hold Eliquis  for 1-2  days prior to procedure however not before July 30,2025 due to recent cardioversion     **This guidance is not considered finalized until pre-operative APP has relayed final recommendations.**

## 2023-10-28 ENCOUNTER — Other Ambulatory Visit (HOSPITAL_BASED_OUTPATIENT_CLINIC_OR_DEPARTMENT_OTHER): Payer: Self-pay

## 2023-10-28 MED ORDER — OMEPRAZOLE 20 MG PO CPDR
40.0000 mg | DELAYED_RELEASE_CAPSULE | Freq: Every day | ORAL | 0 refills | Status: DC
  Filled 2023-10-28: qty 180, 90d supply, fill #0

## 2023-10-28 NOTE — H&P (View-Only) (Signed)
 Electrophysiology Office Follow up Visit Note:    Date:  10/29/2023   ID:  Leslie [name], DOB May 02, 1945, MRN [id number]  PCP:  Leslie Lamarr RAMAN, MD  [facility] Cardiologist:  Jerel Balding, MD  [facility] Electrophysiologist:  Leslie ONEIDA HOLTS, MD    Interval History:     Leslie [name] is a 78 y.o. female who presents for a follow up visit.   I last saw the patient July 18, 2023 for atrial fibrillation.  She is on amiodarone  to help control her atrial fibrillation.  She has significant MR which is complicated her rhythm management.  We also discussed left atrial appendage occlusion during her appointment given a history of symptomatic anemia.  She is currently scheduled for watchman implant November 13, 2023.  Since I last saw her she has had 2 cardioversions, most recently October 13, 2023.  Today she presents to discuss her rhythm control and timing of possible ablation versus Watchman.  She is doing okay.  She confirms that she felt poorly when out of rhythm.  She is interested in getting better control of her atrial fibrillation.  She is taking her amiodarone  twice daily currently.  She is taking her Eliquis  twice daily.      Past medical, surgical, social and family history were reviewed.  ROS:   Please see the history of present illness.    All other systems reviewed and are negative.  EKGs/Labs/Other Studies Reviewed:    The following studies were reviewed today:  Sep 01, 2023 transesophageal echo performed by Dr. Balding showed moderate mitral regurgitation and moderate to severe tricuspid regurgitation.   EKG Interpretation Date/Time:  Wednesday October 29 2023 13:55:42 EDT Ventricular Rate:  51 PR Interval:  180 QRS Duration:  90 QT Interval:  470 QTC Calculation: 433 R Axis:   -5  Text Interpretation: Sinus bradycardia Confirmed by [name] Leslie [phone] on 10/29/2023 2:15:25 PM    Physical Exam:    VS:  BP 110/64   Pulse (!) 51   Ht 5' 5  (1.651 m)   Wt 220 lb 3.2 oz (99.9 kg)   SpO2 98%   BMI 36.64 kg/m     Wt Readings from Last 3 Encounters:  10/29/23 220 lb 3.2 oz (99.9 kg)  10/10/23 219 lb 12.8 oz (99.7 kg)  09/09/23 225 lb 12.8 oz (102.4 kg)     GEN: no distress CARD: RRR, No MRG RESP: No IWOB. CTAB.      ASSESSMENT:    1. Persistent atrial fibrillation (HCC)   2. Iron  deficiency anemia due to chronic blood loss   3. Encounter for long-term (current) use of high-risk medication    PLAN:    In order of problems listed above:  #Persistent atrial fibrillation #Symptomatic anemia Her atrial fibrillation is recurrent and highly symptomatic despite treatment with amiodarone .  She has required multiple cardioversions.  In the past, medical therapy alone was being pursued but given its failure and the mitral regurgitation being classified as moderate on recent TEE, I do think it is reasonable to consider catheter ablation to augment rhythm control.  I discussed the catheter ablation procedure in detail the patient clued the risks, recovery and likelihood of success.  She would like to proceed with this.  I will discuss the timing of this with our watchman team.  It is feasible to do both of these procedures at the same time.  If logistically this cannot be accomplished, favor doing the ablation first and then  staging watchman 6 to 8 weeks later.  For now, continue Eliquis  and amiodarone .  Discussed treatment options today for AF including antiarrhythmic drug therapy and ablation. Discussed risks, recovery and likelihood of success with each treatment strategy. Risk, benefits, and alternatives to EP study and ablation for afib were discussed. These risks include but are not limited to stroke, bleeding, vascular damage, tamponade, perforation, damage to the esophagus, lungs, phrenic nerve and other structures, pulmonary vein stenosis, worsening renal function, coronary vasospasm and death.  Discussed potential need for  repeat ablation procedures and antiarrhythmic drugs after an initial ablation. The patient understands these risk and wishes to proceed.  We will therefore proceed with catheter ablation at the next available time.  Carto, ICE, anesthesia are requested for the procedure.    We will not obtain CT PV protocol prior to the procedure.  I discussed performing the concomitant procedure using ice guidance alone.  She understands that if there is poor visualization of the appendage that we would have to stop after the ablation only and she would have to present a separate day for the watchman implant.    ------------------------   I have seen Leslie [name] in the office today who is being considered for a Watchman left atrial appendage closure device. I believe they will benefit from this procedure given their history of atrial fibrillation, CHA2DS2-VASc score of 5. Unfortunately, the patient is not felt to be a long term anticoagulation candidate secondary to symptomatic anemia. The patient's chart has been reviewed and I feel that they would be a candidate for short term oral anticoagulation after Watchman implant.    It is my belief that after undergoing a LAA closure procedure, Leslie [name] will not need long term anticoagulation which eliminates anticoagulation side effects and major bleeding risk.    Procedural risks for the Watchman implant have been reviewed with the patient including a 0.5% risk of stroke, <1% risk of perforation and <1% risk of device embolization. Other risks include bleeding, vascular damage, tamponade, worsening renal function, and death. The patient understands these risk and wishes to proceed.       The published clinical data on the safety and effectiveness of WATCHMAN include but are not limited to the following: - Holmes DR, Jess BEARD, Sick P et al. for the PROTECT AF Investigators. Percutaneous closure of the left atrial appendage versus warfarin therapy for  prevention of stroke in patients with atrial fibrillation: a randomised non-inferiority trial. Lancet 2009; 374: 534-42. GLENWOOD Jess BEARD, Doshi SK, Jonita VEAR Satchel D et al. on behalf of the PROTECT AF Investigators. Percutaneous Left Atrial Appendage Closure for Stroke Prophylaxis in Patients With Atrial Fibrillation 2.3-Year Follow-up of the PROTECT AF (Watchman Left Atrial Appendage System for Embolic Protection in Patients With Atrial Fibrillation) Trial. Circulation 2013; 127:720-729. - Alli O, Doshi S,  Kar S, Reddy VY, Sievert H et al. Quality of Life Assessment in the Randomized PROTECT AF (Percutaneous Closure of the Left Atrial Appendage Versus Warfarin Therapy for Prevention of Stroke in Patients With Atrial Fibrillation) Trial of Patients at Risk for Stroke With Nonvalvular Atrial Fibrillation. J Am Coll Cardiol 2013; 61:1790-8. GLENWOOD Satchel DR, Archer [name], Price M, Whisenant B, Sievert H, Doshi S, Huber K, Reddy V. Prospective randomized evaluation of the Watchman left atrial appendage Device in patients with atrial fibrillation versus long-term warfarin therapy; the PREVAIL trial. Journal of the [employer] of Cardiology, Vol. 4, No. 1, 2014, 1-11. - Kar S, Doshi SK, Sadhu  A, Horton R, Osorio J et al. Primary outcome evaluation of a next-generation left atrial appendage closure device: results from the PINNACLE FLX trial. Circulation 2021;143(18)1754-1762.      After today's visit with the patient which was dedicated solely for shared decision making visit regarding LAA closure device, the patient decided to proceed with the LAA appendage closure procedure scheduled to be done in the near future at [facility].   Given abnormal kidney function, plan for no CT scan.     HAS-BLED score 3 Hypertension Yes  Abnormal renal and liver function (Dialysis, transplant, Cr >2.26 mg/dL /Cirrhosis or Bilirubin >2x Normal or AST/ALT/AP >3x Normal) No  Stroke No  Bleeding Yes  Labile INR  (Unstable/high INR) No  Elderly (>65) Yes  Drugs or alcohol (>= 8 drinks/week, anti-plt or NSAID) No    CHA2DS2-VASc Score = 5  The patient's score is based upon: CHF History: 1 HTN History: 1 Diabetes History: 0 Stroke History: 0 Vascular Disease History: 0 Age Score: 2 Gender Score: 1  #High risk med monitoring-amiodarone  Repeat CMP, TSH and free T4 Reduce dose to once daily   Signed, Leslie Holts, MD, [facility], [facility] 10/29/2023 2:15 PM    Electrophysiology [facility]

## 2023-10-28 NOTE — Progress Notes (Unsigned)
 Electrophysiology Office Follow up Visit Note:    Date:  10/29/2023   ID:  NDEA KILROY, DOB [birth year], MRN [id number]  PCP:  [name], MD  [facility] Cardiologist:  Jerel Balding, MD  [facility] Electrophysiologist:  OLE ONEIDA HOLTS, MD    Interval History:     Leslie [name] is a 78 y.o. female who presents for a follow up visit.   I last saw the patient July 18, 2023 for atrial fibrillation.  She is on amiodarone  to help control her atrial fibrillation.  She has significant MR which is complicated her rhythm management.  We also discussed left atrial appendage occlusion during her appointment given a history of symptomatic anemia.  She is currently scheduled for watchman implant November 13, 2023.  Since I last saw her she has had 2 cardioversions, most recently October 13, 2023.  Today she presents to discuss her rhythm control and timing of possible ablation versus Watchman.  She is doing okay.  She confirms that she felt poorly when out of rhythm.  She is interested in getting better control of her atrial fibrillation.  She is taking her amiodarone  twice daily currently.  She is taking her Eliquis  twice daily.      Past medical, surgical, social and family history were reviewed.  ROS:   Please see the history of present illness.    All other systems reviewed and are negative.  EKGs/Labs/Other Studies Reviewed:    The following studies were reviewed today:  Sep 01, 2023 transesophageal echo performed by Dr. Balding showed moderate mitral regurgitation and moderate to severe tricuspid regurgitation.   EKG Interpretation Date/Time:  Wednesday October 29 2023 13:55:42 EDT Ventricular Rate:  51 PR Interval:  180 QRS Duration:  90 QT Interval:  470 QTC Calculation: 433 R Axis:   -5  Text Interpretation: Sinus bradycardia Confirmed by HOLTS OLE [phone]) on 10/29/2023 2:15:25 PM    Physical Exam:    VS:  BP 110/64   Pulse (!) 51   Ht 5' 5  (1.651 m)   Wt 220 lb 3.2 oz (99.9 kg)   SpO2 98%   BMI 36.64 kg/m     Wt Readings from Last 3 Encounters:  10/29/23 220 lb 3.2 oz (99.9 kg)  10/10/23 219 lb 12.8 oz (99.7 kg)  09/09/23 225 lb 12.8 oz (102.4 kg)     GEN: no distress CARD: RRR, No MRG RESP: No IWOB. CTAB.      ASSESSMENT:    1. Persistent atrial fibrillation (HCC)   2. Iron  deficiency anemia due to chronic blood loss   3. Encounter for long-term (current) use of high-risk medication    PLAN:    In order of problems listed above:  #Persistent atrial fibrillation #Symptomatic anemia Her atrial fibrillation is recurrent and highly symptomatic despite treatment with amiodarone .  She has required multiple cardioversions.  In the past, medical therapy alone was being pursued but given its failure and the mitral regurgitation being classified as moderate on recent TEE, I do think it is reasonable to consider catheter ablation to augment rhythm control.  I discussed the catheter ablation procedure in detail the patient clued the risks, recovery and likelihood of success.  She would like to proceed with this.  I will discuss the timing of this with our watchman team.  It is feasible to do both of these procedures at the same time.  If logistically this cannot be accomplished, favor doing the ablation first and then  staging watchman 6 to 8 weeks later.  For now, continue Eliquis  and amiodarone .  Discussed treatment options today for AF including antiarrhythmic drug therapy and ablation. Discussed risks, recovery and likelihood of success with each treatment strategy. Risk, benefits, and alternatives to EP study and ablation for afib were discussed. These risks include but are not limited to stroke, bleeding, vascular damage, tamponade, perforation, damage to the esophagus, lungs, phrenic nerve and other structures, pulmonary vein stenosis, worsening renal function, coronary vasospasm and death.  Discussed potential need for  repeat ablation procedures and antiarrhythmic drugs after an initial ablation. The patient understands these risk and wishes to proceed.  We will therefore proceed with catheter ablation at the next available time.  Carto, ICE, anesthesia are requested for the procedure.    We will not obtain CT PV protocol prior to the procedure.  I discussed performing the concomitant procedure using ice guidance alone.  She understands that if there is poor visualization of the appendage that we would have to stop after the ablation only and she would have to present a separate day for the watchman implant.    ------------------------   I have seen Leslie [name] in the office today who is being considered for a Watchman left atrial appendage closure device. I believe they will benefit from this procedure given their history of atrial fibrillation, CHA2DS2-VASc score of 5. Unfortunately, the patient is not felt to be a long term anticoagulation candidate secondary to symptomatic anemia. The patient's chart has been reviewed and I feel that they would be a candidate for short term oral anticoagulation after Watchman implant.    It is my belief that after undergoing a LAA closure procedure, Leslie [name] will not need long term anticoagulation which eliminates anticoagulation side effects and major bleeding risk.    Procedural risks for the Watchman implant have been reviewed with the patient including a 0.5% risk of stroke, <1% risk of perforation and <1% risk of device embolization. Other risks include bleeding, vascular damage, tamponade, worsening renal function, and death. The patient understands these risk and wishes to proceed.       The published clinical data on the safety and effectiveness of WATCHMAN include but are not limited to the following: - Holmes DR, Jess BEARD, Sick P et al. for the PROTECT AF Investigators. Percutaneous closure of the left atrial appendage versus warfarin therapy for  prevention of stroke in patients with atrial fibrillation: a randomised non-inferiority trial. Lancet 2009; 374: 534-42. GLENWOOD Jess BEARD, Doshi SK, Jonita VEAR Satchel D et al. on behalf of the PROTECT AF Investigators. Percutaneous Left Atrial Appendage Closure for Stroke Prophylaxis in Patients With Atrial Fibrillation 2.3-Year Follow-up of the PROTECT AF (Watchman Left Atrial Appendage System for Embolic Protection in Patients With Atrial Fibrillation) Trial. Circulation 2013; 127:720-729. - Alli O, Doshi S,  Kar S, Reddy VY, Sievert H et al. Quality of Life Assessment in the Randomized PROTECT AF (Percutaneous Closure of the Left Atrial Appendage Versus Warfarin Therapy for Prevention of Stroke in Patients With Atrial Fibrillation) Trial of Patients at Risk for Stroke With Nonvalvular Atrial Fibrillation. J Am Coll Cardiol 2013; 61:1790-8. GLENWOOD Satchel DR, Archer RAMAN, Price M, Whisenant B, Sievert H, Doshi S, Huber K, Reddy V. Prospective randomized evaluation of the Watchman left atrial appendage Device in patients with atrial fibrillation versus long-term warfarin therapy; the PREVAIL trial. Journal of the [employer] of Cardiology, Vol. 4, No. 1, 2014, 1-11. - Kar S, Doshi SK, Sadhu  A, Horton R, Osorio J et al. Primary outcome evaluation of a next-generation left atrial appendage closure device: results from the PINNACLE FLX trial. Circulation 2021;143(18)1754-1762.      After today's visit with the patient which was dedicated solely for shared decision making visit regarding LAA closure device, the patient decided to proceed with the LAA appendage closure procedure scheduled to be done in the near future at [facility].   Given abnormal kidney function, plan for no CT scan.     HAS-BLED score 3 Hypertension Yes  Abnormal renal and liver function (Dialysis, transplant, Cr >2.26 mg/dL /Cirrhosis or Bilirubin >2x Normal or AST/ALT/AP >3x Normal) No  Stroke No  Bleeding Yes  Labile INR  (Unstable/high INR) No  Elderly (>65) Yes  Drugs or alcohol (>= 8 drinks/week, anti-plt or NSAID) No    CHA2DS2-VASc Score = 5  The patient's score is based upon: CHF History: 1 HTN History: 1 Diabetes History: 0 Stroke History: 0 Vascular Disease History: 0 Age Score: 2 Gender Score: 1  #High risk med monitoring-amiodarone  Repeat CMP, TSH and free T4 Reduce dose to once daily   Signed, Ole Holts, MD, [facility], [facility] 10/29/2023 2:15 PM    Electrophysiology [facility]

## 2023-10-29 ENCOUNTER — Ambulatory Visit: Attending: Cardiology | Admitting: Cardiology

## 2023-10-29 VITALS — BP 110/64 | HR 51 | Ht 65.0 in | Wt 220.2 lb

## 2023-10-29 DIAGNOSIS — D500 Iron deficiency anemia secondary to blood loss (chronic): Secondary | ICD-10-CM

## 2023-10-29 DIAGNOSIS — I4819 Other persistent atrial fibrillation: Secondary | ICD-10-CM

## 2023-10-29 DIAGNOSIS — Z01812 Encounter for preprocedural laboratory examination: Secondary | ICD-10-CM | POA: Diagnosis not present

## 2023-10-29 DIAGNOSIS — Z79899 Other long term (current) drug therapy: Secondary | ICD-10-CM

## 2023-10-29 NOTE — Patient Instructions (Addendum)
 Medication Instructions:  Your physician has recommended you make the following change in your medication:  Decrease your amiodarone  200mg  to once daily  Lab Work: BMET, CBC - Today  You may go to any [facility] Location for your lab work:  [facility] - [address] ([facility]) - [address] [phone] [address]  [place] - [address]   [place]  - [address]   [place] - [address] - [address] [employer]  - [address] - [address] ([facility]   If you have labs (blood work) drawn today and your tests are completely normal, you will receive your results only by: [employer] (if you have MyChart)  If you have any lab test that is abnormal or we need to change your treatment, we will call you or send a MyChart message to review the results.  Testing/Procedures: None ordered.  Follow-Up: At [facility], you and your health needs are our priority.  As part of our continuing mission to provide you with exceptional heart care, we have created designated Provider Care Teams.  These Care Teams include your primary Cardiologist (physician) and Advanced Practice Providers (APPs -  Physician Assistants and Nurse Practitioners) who all work together to provide you with the care you need, when you need it.  We recommend signing up for the patient portal called MyChart.  Sign up information is provided on this After Visit Summary.  MyChart is used to connect with patients for Virtual Visits (Telemedicine).  Patients are able to view lab/test results, encounter notes, upcoming appointments, etc.  Non-urgent messages can be sent to your provider as well.   To learn more about what you can do with MyChart, go to [email or web address].    Your next appointment:   To be scheduled  The format for your next appointment:   In Person  Provider:    Danelle Birmingham, MD{or one of the following Advanced Practice Providers on your designated Care Team:   Charlies Arthur, [place] Ozell Jodie Passey, [place] Leotis Barrack, NP  Note: Remote monitoring is used to monitor your Pacemaker/ ICD from home. This monitoring reduces the number of office visits required to check your device to one time per year. It allows us  to keep an eye on the functioning of your device to ensure it is working properly.

## 2023-10-30 ENCOUNTER — Other Ambulatory Visit: Payer: Self-pay

## 2023-10-30 DIAGNOSIS — Z79899 Other long term (current) drug therapy: Secondary | ICD-10-CM

## 2023-10-30 DIAGNOSIS — I4819 Other persistent atrial fibrillation: Secondary | ICD-10-CM

## 2023-10-30 DIAGNOSIS — D500 Iron deficiency anemia secondary to blood loss (chronic): Secondary | ICD-10-CM

## 2023-10-30 DIAGNOSIS — Z01812 Encounter for preprocedural laboratory examination: Secondary | ICD-10-CM

## 2023-11-04 DIAGNOSIS — Z79899 Other long term (current) drug therapy: Secondary | ICD-10-CM | POA: Diagnosis not present

## 2023-11-04 DIAGNOSIS — Z01812 Encounter for preprocedural laboratory examination: Secondary | ICD-10-CM | POA: Diagnosis not present

## 2023-11-04 DIAGNOSIS — D500 Iron deficiency anemia secondary to blood loss (chronic): Secondary | ICD-10-CM | POA: Diagnosis not present

## 2023-11-04 DIAGNOSIS — I4819 Other persistent atrial fibrillation: Secondary | ICD-10-CM | POA: Diagnosis not present

## 2023-11-05 LAB — BASIC METABOLIC PANEL WITH GFR
BUN/Creatinine Ratio: 15 (ref 12–28)
BUN: 20 mg/dL (ref 8–27)
CO2: 20 mmol/L (ref 20–29)
Calcium: 8.7 mg/dL (ref 8.7–10.3)
Chloride: 100 mmol/L (ref 96–106)
Creatinine, Ser: 1.33 mg/dL — ABNORMAL HIGH (ref 0.57–1.00)
Glucose: 89 mg/dL (ref 70–99)
Potassium: 5.1 mmol/L (ref 3.5–5.2)
Sodium: 136 mmol/L (ref 134–144)
eGFR: 41 mL/min/1.73 — ABNORMAL LOW (ref >59)

## 2023-11-05 LAB — CBC
Hematocrit: 31.6 % — ABNORMAL LOW (ref 34.0–46.6)
Hemoglobin: 9.4 g/dL — ABNORMAL LOW (ref 11.1–15.9)
MCH: 24.5 pg — ABNORMAL LOW (ref 26.6–33.0)
MCHC: 29.7 g/dL — ABNORMAL LOW (ref 31.5–35.7)
MCV: 83 fL (ref 79–97)
Platelets: 253 x10E3/uL (ref 150–450)
RBC: 3.83 x10E6/uL (ref 3.77–5.28)
RDW: 15.3 % (ref 11.7–15.4)
WBC: 5.7 x10E3/uL (ref 3.4–10.8)

## 2023-11-06 ENCOUNTER — Ambulatory Visit: Payer: Self-pay

## 2023-11-06 ENCOUNTER — Other Ambulatory Visit: Payer: Self-pay

## 2023-11-06 DIAGNOSIS — I4819 Other persistent atrial fibrillation: Secondary | ICD-10-CM

## 2023-11-06 DIAGNOSIS — D500 Iron deficiency anemia secondary to blood loss (chronic): Secondary | ICD-10-CM

## 2023-11-10 DIAGNOSIS — D500 Iron deficiency anemia secondary to blood loss (chronic): Secondary | ICD-10-CM | POA: Diagnosis not present

## 2023-11-10 DIAGNOSIS — I4819 Other persistent atrial fibrillation: Secondary | ICD-10-CM | POA: Diagnosis not present

## 2023-11-10 LAB — CBC WITH DIFFERENTIAL/PLATELET
Basophils Absolute: 0.1 x10E3/uL (ref 0.0–0.2)
Basos: 1 %
EOS (ABSOLUTE): 0.2 x10E3/uL (ref 0.0–0.4)
Eos: 3 %
Hematocrit: 32.3 % — ABNORMAL LOW (ref 34.0–46.6)
Hemoglobin: 9.7 g/dL — ABNORMAL LOW (ref 11.1–15.9)
Immature Grans (Abs): 0 x10E3/uL (ref 0.0–0.1)
Immature Granulocytes: 0 %
Lymphocytes Absolute: 0.9 x10E3/uL (ref 0.7–3.1)
Lymphs: 17 %
MCH: 24.6 pg — ABNORMAL LOW (ref 26.6–33.0)
MCHC: 30 g/dL — ABNORMAL LOW (ref 31.5–35.7)
MCV: 82 fL (ref 79–97)
Monocytes Absolute: 0.5 x10E3/uL (ref 0.1–0.9)
Monocytes: 8 %
Neutrophils Absolute: 4 x10E3/uL (ref 1.4–7.0)
Neutrophils: 71 %
Platelets: 273 x10E3/uL (ref 150–450)
RBC: 3.95 x10E6/uL (ref 3.77–5.28)
RDW: 15 % (ref 11.7–15.4)
WBC: 5.6 x10E3/uL (ref 3.4–10.8)

## 2023-11-11 ENCOUNTER — Ambulatory Visit: Payer: Self-pay

## 2023-11-11 ENCOUNTER — Telehealth: Payer: Self-pay

## 2023-11-11 NOTE — Telephone Encounter (Signed)
 Confirmed procedure date of 11/13/2023. Confirmed arrival time of 1100 for procedure time at 1330. Reviewed pre-procedure instructions with patient. She understands to take all medications as directed through the day before the procedure and to not take any medications the AM of the procedure. Confirmed she has no contrast allergy. Confirmed she has no PPM or defibrillator. The patient understands to call if questions/concerns arise prior to procedure.  She was grateful for call and agreed with plan.

## 2023-11-13 ENCOUNTER — Inpatient Hospital Stay (HOSPITAL_COMMUNITY): Admitting: Certified Registered Nurse Anesthetist

## 2023-11-13 ENCOUNTER — Other Ambulatory Visit: Payer: Self-pay

## 2023-11-13 ENCOUNTER — Inpatient Hospital Stay (HOSPITAL_COMMUNITY)
Admission: RE | Admit: 2023-11-13 | Discharge: 2023-11-14 | DRG: 317 | Disposition: A | Discharge status: Home / Self Care | Attending: Cardiology | Admitting: Cardiology

## 2023-11-13 ENCOUNTER — Encounter (HOSPITAL_COMMUNITY): Payer: Self-pay | Admitting: Cardiology

## 2023-11-13 ENCOUNTER — Encounter (HOSPITAL_COMMUNITY)
Admission: RE | Disposition: A | Discharge status: Home / Self Care | Payer: Self-pay | Source: Home / Self Care | Attending: Cardiology

## 2023-11-13 ENCOUNTER — Inpatient Hospital Stay (HOSPITAL_COMMUNITY)

## 2023-11-13 DIAGNOSIS — Z882 Allergy status to sulfonamides status: Secondary | ICD-10-CM

## 2023-11-13 DIAGNOSIS — E039 Hypothyroidism, unspecified: Secondary | ICD-10-CM | POA: Diagnosis present

## 2023-11-13 DIAGNOSIS — I5032 Chronic diastolic (congestive) heart failure: Secondary | ICD-10-CM | POA: Diagnosis present

## 2023-11-13 DIAGNOSIS — I130 Hypertensive heart and chronic kidney disease with heart failure and stage 1 through stage 4 chronic kidney disease, or unspecified chronic kidney disease: Secondary | ICD-10-CM | POA: Diagnosis present

## 2023-11-13 DIAGNOSIS — Z881 Allergy status to other antibiotic agents status: Secondary | ICD-10-CM | POA: Diagnosis not present

## 2023-11-13 DIAGNOSIS — Z7901 Long term (current) use of anticoagulants: Secondary | ICD-10-CM

## 2023-11-13 DIAGNOSIS — Z880 Allergy status to penicillin: Secondary | ICD-10-CM | POA: Diagnosis not present

## 2023-11-13 DIAGNOSIS — E669 Obesity, unspecified: Secondary | ICD-10-CM | POA: Diagnosis present

## 2023-11-13 DIAGNOSIS — J45909 Unspecified asthma, uncomplicated: Secondary | ICD-10-CM | POA: Diagnosis not present

## 2023-11-13 DIAGNOSIS — Z6836 Body mass index (BMI) 36.0-36.9, adult: Secondary | ICD-10-CM | POA: Diagnosis not present

## 2023-11-13 DIAGNOSIS — I5043 Acute on chronic combined systolic (congestive) and diastolic (congestive) heart failure: Secondary | ICD-10-CM

## 2023-11-13 DIAGNOSIS — R001 Bradycardia, unspecified: Secondary | ICD-10-CM | POA: Diagnosis present

## 2023-11-13 DIAGNOSIS — I4819 Other persistent atrial fibrillation: Secondary | ICD-10-CM | POA: Diagnosis not present

## 2023-11-13 DIAGNOSIS — D500 Iron deficiency anemia secondary to blood loss (chronic): Secondary | ICD-10-CM | POA: Diagnosis present

## 2023-11-13 DIAGNOSIS — Z888 Allergy status to other drugs, medicaments and biological substances status: Secondary | ICD-10-CM | POA: Diagnosis not present

## 2023-11-13 DIAGNOSIS — I4891 Unspecified atrial fibrillation: Principal | ICD-10-CM | POA: Diagnosis present

## 2023-11-13 DIAGNOSIS — I081 Rheumatic disorders of both mitral and tricuspid valves: Secondary | ICD-10-CM | POA: Diagnosis not present

## 2023-11-13 DIAGNOSIS — N1831 Chronic kidney disease, stage 3a: Secondary | ICD-10-CM

## 2023-11-13 DIAGNOSIS — Z885 Allergy status to narcotic agent status: Secondary | ICD-10-CM

## 2023-11-13 DIAGNOSIS — Z006 Encounter for examination for normal comparison and control in clinical research program: Secondary | ICD-10-CM | POA: Diagnosis not present

## 2023-11-13 DIAGNOSIS — E1122 Type 2 diabetes mellitus with diabetic chronic kidney disease: Secondary | ICD-10-CM | POA: Diagnosis present

## 2023-11-13 DIAGNOSIS — I480 Paroxysmal atrial fibrillation: Secondary | ICD-10-CM | POA: Diagnosis not present

## 2023-11-13 HISTORY — DX: Anemia, unspecified: D64.9

## 2023-11-13 LAB — POCT ACTIVATED CLOTTING TIME
Activated Clotting Time: 308 s
Activated Clotting Time: 314 s

## 2023-11-13 LAB — TYPE AND SCREEN
ABO/RH(D): O NEG
Antibody Screen: NEGATIVE

## 2023-11-13 LAB — SURGICAL PCR SCREEN
MRSA, PCR: NEGATIVE
Staphylococcus aureus: POSITIVE — AB

## 2023-11-13 MED ORDER — LACTATED RINGERS IV SOLN: INTRAVENOUS | Status: DC

## 2023-11-13 MED ORDER — PANTOPRAZOLE SODIUM 40 MG PO TBEC
40.0000 mg | DELAYED_RELEASE_TABLET | Freq: Every day | ORAL | Status: DC
  Administered 2023-11-14: 40 mg via ORAL
  Filled 2023-11-13: qty 1

## 2023-11-13 MED ORDER — FUROSEMIDE 20 MG PO TABS
20.0000 mg | ORAL_TABLET | Freq: Every day | ORAL | Status: DC
  Administered 2023-11-14: 20 mg via ORAL
  Filled 2023-11-13: qty 1

## 2023-11-13 MED ORDER — SODIUM CHLORIDE 0.9 % IV SOLN: INTRAVENOUS | Status: DC

## 2023-11-13 MED ORDER — LIDOCAINE-EPINEPHRINE 1 %-1:100000 IJ SOLN
INTRAMUSCULAR | Status: AC
  Filled 2023-11-13: qty 1

## 2023-11-13 MED ORDER — SUGAMMADEX SODIUM 200 MG/2ML IV SOLN
INTRAVENOUS | Status: DC | PRN
  Administered 2023-11-13: 199.6 mg via INTRAVENOUS

## 2023-11-13 MED ORDER — SERTRALINE HCL 50 MG PO TABS
25.0000 mg | ORAL_TABLET | Freq: Every day | ORAL | Status: DC
  Administered 2023-11-14: 25 mg via ORAL
  Filled 2023-11-13: qty 1

## 2023-11-13 MED ORDER — APIXABAN 5 MG PO TABS
5.0000 mg | ORAL_TABLET | Freq: Two times a day (BID) | ORAL | Status: DC
  Administered 2023-11-13 – 2023-11-14 (×2): 5 mg via ORAL
  Filled 2023-11-13 (×2): qty 1

## 2023-11-13 MED ORDER — ONDANSETRON HCL 4 MG/2ML IJ SOLN
INTRAMUSCULAR | Status: DC | PRN
Start: 2023-11-13 — End: 2023-11-13
  Administered 2023-11-13: 4 mg via INTRAVENOUS

## 2023-11-13 MED ORDER — POTASSIUM CHLORIDE CRYS ER 20 MEQ PO TBCR: 20.0000 meq | EXTENDED_RELEASE_TABLET | Freq: Two times a day (BID) | ORAL | Status: DC

## 2023-11-13 MED ORDER — CHLORHEXIDINE GLUCONATE 0.12 % MT SOLN
15.0000 mL | Freq: Once | OROMUCOSAL | Status: AC
  Administered 2023-11-13: 15 mL via OROMUCOSAL

## 2023-11-13 MED ORDER — CHLORHEXIDINE GLUCONATE 0.12 % MT SOLN
OROMUCOSAL | Status: AC
  Filled 2023-11-13: qty 15

## 2023-11-13 MED ORDER — ACETAMINOPHEN 325 MG PO TABS
650.0000 mg | ORAL_TABLET | ORAL | Status: DC | PRN
  Administered 2023-11-13 – 2023-11-14 (×3): 650 mg via ORAL
  Filled 2023-11-13 (×3): qty 2

## 2023-11-13 MED ORDER — HEPARIN SODIUM (PORCINE) 1000 UNIT/ML IJ SOLN
INTRAMUSCULAR | Status: DC | PRN
  Administered 2023-11-13: 4000 [IU] via INTRAVENOUS
  Administered 2023-11-13: 16000 [IU] via INTRAVENOUS

## 2023-11-13 MED ORDER — PROPOFOL 10 MG/ML IV BOLUS
INTRAVENOUS | Status: DC | PRN
Start: 2023-11-13 — End: 2023-11-13
  Administered 2023-11-13: 150 mg via INTRAVENOUS

## 2023-11-13 MED ORDER — SODIUM CHLORIDE 0.9% FLUSH
3.0000 mL | Freq: Two times a day (BID) | INTRAVENOUS | Status: DC
  Administered 2023-11-13: 3 mL via INTRAVENOUS

## 2023-11-13 MED ORDER — FENTANYL CITRATE (PF) 100 MCG/2ML IJ SOLN
INTRAMUSCULAR | Status: DC | PRN
  Administered 2023-11-13: 100 ug via INTRAVENOUS

## 2023-11-13 MED ORDER — LIDOCAINE-EPINEPHRINE 1 %-1:100000 IJ SOLN
INTRAMUSCULAR | Status: DC | PRN
  Administered 2023-11-13: 10 mL

## 2023-11-13 MED ORDER — PHENYLEPHRINE HCL (PRESSORS) 10 MG/ML IV SOLN
INTRAVENOUS | Status: DC | PRN
  Administered 2023-11-13: 80 ug via INTRAVENOUS
  Administered 2023-11-13: 160 ug via INTRAVENOUS

## 2023-11-13 MED ORDER — PHENYLEPHRINE HCL-NACL 20-0.9 MG/250ML-% IV SOLN
INTRAVENOUS | Status: DC | PRN
Start: 2023-11-13 — End: 2023-11-13
  Administered 2023-11-13: 20 ug/min via INTRAVENOUS
  Administered 2023-11-13: 30 ug/min via INTRAVENOUS

## 2023-11-13 MED ORDER — HEPARIN (PORCINE) IN NACL 1000-0.9 UT/500ML-% IV SOLN
INTRAVENOUS | Status: DC | PRN
  Administered 2023-11-13 (×3): 500 mL

## 2023-11-13 MED ORDER — ATORVASTATIN CALCIUM 40 MG PO TABS
40.0000 mg | ORAL_TABLET | Freq: Every morning | ORAL | Status: DC
  Administered 2023-11-14: 40 mg via ORAL
  Filled 2023-11-13: qty 1

## 2023-11-13 MED ORDER — SODIUM CHLORIDE 0.9 % IV SOLN: 250.0000 mL | INTRAVENOUS | Status: DC | PRN

## 2023-11-13 MED ORDER — CHLORHEXIDINE GLUCONATE 4 % EX SOLN
Freq: Once | CUTANEOUS | Status: DC
  Filled 2023-11-13: qty 15

## 2023-11-13 MED ORDER — ROCURONIUM BROMIDE 10 MG/ML (PF) SYRINGE
PREFILLED_SYRINGE | INTRAVENOUS | Status: DC | PRN
Start: 2023-11-13 — End: 2023-11-13
  Administered 2023-11-13: 20 mg via INTRAVENOUS
  Administered 2023-11-13: 60 mg via INTRAVENOUS
  Administered 2023-11-13: 20 mg via INTRAVENOUS

## 2023-11-13 MED ORDER — ATROPINE SULFATE 1 MG/10ML IJ SOSY
PREFILLED_SYRINGE | INTRAMUSCULAR | Status: DC | PRN
  Administered 2023-11-13: 1 mg via INTRAVENOUS

## 2023-11-13 MED ORDER — AMIODARONE HCL 200 MG PO TABS
200.0000 mg | ORAL_TABLET | Freq: Every day | ORAL | Status: DC
Start: 2023-11-14 — End: 2023-11-14
  Administered 2023-11-14: 200 mg via ORAL
  Filled 2023-11-13: qty 1

## 2023-11-13 MED ORDER — COLCHICINE 0.6 MG PO TABS
0.6000 mg | ORAL_TABLET | Freq: Two times a day (BID) | ORAL | Status: DC
  Administered 2023-11-13 – 2023-11-14 (×2): 0.6 mg via ORAL
  Filled 2023-11-13 (×2): qty 1

## 2023-11-13 MED ORDER — MAGNESIUM OXIDE -MG SUPPLEMENT 400 (240 MG) MG PO TABS: 400.0000 mg | ORAL_TABLET | Freq: Every day | ORAL | Status: DC

## 2023-11-13 MED ORDER — DEXAMETHASONE SODIUM PHOSPHATE 10 MG/ML IJ SOLN
INTRAMUSCULAR | Status: DC | PRN
  Administered 2023-11-13: 10 mg via INTRAVENOUS

## 2023-11-13 MED ORDER — CARVEDILOL 25 MG PO TABS
25.0000 mg | ORAL_TABLET | Freq: Two times a day (BID) | ORAL | Status: DC
  Administered 2023-11-13 – 2023-11-14 (×2): 25 mg via ORAL
  Filled 2023-11-13 (×2): qty 1

## 2023-11-13 MED ORDER — ONDANSETRON HCL 4 MG/2ML IJ SOLN: 4.0000 mg | Freq: Four times a day (QID) | INTRAMUSCULAR | Status: DC | PRN

## 2023-11-13 MED ORDER — PROTAMINE SULFATE 10 MG/ML IV SOLN
INTRAVENOUS | Status: DC | PRN
Start: 2023-11-13 — End: 2023-11-13
  Administered 2023-11-13: 35 mg via INTRAVENOUS
  Administered 2023-11-13: 10 mg via INTRAVENOUS

## 2023-11-13 MED ORDER — ATROPINE SULFATE 1 MG/10ML IJ SOSY
PREFILLED_SYRINGE | INTRAMUSCULAR | Status: AC
  Filled 2023-11-13: qty 10

## 2023-11-13 MED ORDER — SODIUM CHLORIDE 0.9% FLUSH: 3.0000 mL | INTRAVENOUS | Status: DC | PRN

## 2023-11-13 MED ORDER — VANCOMYCIN HCL IN DEXTROSE 1-5 GM/200ML-% IV SOLN
1000.0000 mg | INTRAVENOUS | Status: AC
  Administered 2023-11-13: 1000 mg via INTRAVENOUS
  Filled 2023-11-13: qty 200

## 2023-11-13 MED ORDER — LIDOCAINE 2% (20 MG/ML) 5 ML SYRINGE
INTRAMUSCULAR | Status: DC | PRN
Start: 2023-11-13 — End: 2023-11-13
  Administered 2023-11-13: 100 mg via INTRAVENOUS

## 2023-11-13 MED ORDER — FENTANYL CITRATE (PF) 100 MCG/2ML IJ SOLN
INTRAMUSCULAR | Status: AC
  Filled 2023-11-13: qty 2

## 2023-11-13 MED ORDER — LEVOTHYROXINE SODIUM 75 MCG PO TABS
75.0000 ug | ORAL_TABLET | Freq: Every morning | ORAL | Status: DC
  Administered 2023-11-14: 75 ug via ORAL
  Filled 2023-11-13: qty 1

## 2023-11-13 SURGICAL SUPPLY — 27 items
BLANKET WARM UNDERBOD FULL ACC (MISCELLANEOUS) ×1 IMPLANT
CABLE FARASTAR GEN2 SNGL USE (CABLE) IMPLANT
CATH FARAWAVE 2.0 31 (CATHETERS) IMPLANT
CATH INFINITI 5FR ANG PIGTAIL (CATHETERS) IMPLANT
CATH NUVISION NON NAV ICE (CATHETERS) IMPLANT
CATH OCTARAY 2.0 F 3-3-3-3-3 (CATHETERS) IMPLANT
CATH WATCHMAN STEER ACCESS SYS (CATHETERS) IMPLANT
CATH WEBSTER BI DIR CS D-F CRV (CATHETERS) IMPLANT
CLOSURE PERCLOSE PROSTYLE (VASCULAR PRODUCTS) IMPLANT
COVER SWIFTLINK CONNECTOR (BAG) ×1 IMPLANT
DEVICE WATCHMAN FLX PRO PROC (KITS) IMPLANT
DEVICE WATCHMAN TRUSTEER PROC (KITS) IMPLANT
DILATOR VESSEL 38 20CM 16FR (INTRODUCER) IMPLANT
GUIDEWIRE INQWIRE 1.5J.035X260 (WIRE) IMPLANT
INTRODUCER PERFORM 12 30 .038 (SHEATH) IMPLANT
KIT VERSACROSS CNCT FARADRIVE (KITS) IMPLANT
PACK EP LF (CUSTOM PROCEDURE TRAY) ×1 IMPLANT
PAD DEFIB RADIO PHYSIO CONN (PAD) ×1 IMPLANT
PATCH CARTO3 (PAD) IMPLANT
SHEATH FARADRIVE STEERABLE (SHEATH) IMPLANT
SHEATH PINNACLE 8F 10CM (SHEATH) IMPLANT
SHEATH PROBE COVER 6X72 (BAG) ×1 IMPLANT
TRANSDUCER W/STOPCOCK (MISCELLANEOUS) ×1 IMPLANT
TUBING CIL FLEX 10 FLL-RA (TUBING) ×1 IMPLANT
WATCHMAN FLX PRO 31 (Prosthesis & Implant Heart) IMPLANT
WATCHMAN FLX PRO PROCEDURE (KITS) ×1 IMPLANT
WATCHMAN TRUSTEER PROCEDURE (KITS) ×1 IMPLANT

## 2023-11-13 NOTE — Discharge Summary (Signed)
 Electrophysiology Discharge Summary   Patient ID: Leslie [name],  MRN: [id number], DOB/AGE: 09-18-1945 78 y.o.  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 Cindie.   CONCLUSIONS: 1. Successful PVI 2. Successful ablation/isolation of the posterior wall 3. Successful Watchman device implant (left atrial appendage occlusion) 4. Intracardiac echo reveals trivial pericardial effusion, normal left atrial architecture 5. No early apparent complications. 6. Colchicine  0.6mg  PO BID x 5 days 7. Protonix  40mg  PO daily x 45 days   Post Implant Anticoagulation Strategy: Continue Eliquis  5mg  by mouth twice daily for 90 days. After 90 days, stop Eliquis  and start Plavix 75mg  by mouth once daily to complete 6 months of post implant medical therapy. Plan for CT (if renal function allows) after 60 days to assess appendage patency and Watchman position.  Brief HPI: Leslie [name] is a 78 y.o. female with a history of Persistent Atrial Fibrillation who was referred to Electrophysiology in the outpatient setting    Hospital Course:  The patient was admitted and underwent left atrial appendage occlusive device placement as above.  The patient was monitored overnight and has done very well with no concerns. Groin site has been stable without evidence of  hematoma or bleeding. Wound care and restrictions were reviewed with the patient.   The patient has been scheduled for post procedure follow up with EP Team as usual post ablation/watchman. They will continue Eliquis  for 90 days then stop.  At that time she will transition to Plavix 75mg  daily to complete 6 months of therapy. They will require dental SBE for 6 month post op and should refrain from dental work or cleanings for the first 45 days post implant. SBE to be RXd at follow up.   A repeat CT scan will be performed in approximately 60 days to ensure proper seal of the device.    Physical Exam: Vitals:   11/14/23 0300 11/14/23 0334 11/14/23 0558 11/14/23 0852  BP:  (!) 144/50  (!) 123/47  Pulse: 60 66  71  Resp:  20 14 20   Temp:  97.8 F (36.6 C)  98.1 F (36.7 C)  TempSrc:  Oral    SpO2:  98%  98%  Weight:      Height:        GEN: Well nourished, well developed in no acute distress NECK: No JVD; No carotid bruits CARDIAC: Regular rate and rhythm, no murmurs, rubs, gallops RESPIRATORY:  Clear to auscultation without rales, wheezing or rhonchi  ABDOMEN: Soft, non-tender, non-distended EXTREMITIES:  No edema; No deformity. Groin site Stable  with ecchymosis to R groin   Discharge Medications:  Allergies as of 11/14/2023       Reactions   Clindamycin Hcl    Lip and face swelling   Augmentin [amoxicillin-pot Clavulanate] Diarrhea      Codeine Nausea Only, Other (See Comments)   Hyperactivity   Demerol [meperidine] Nausea And Vomiting   Sulfa Antibiotics Rash  Medication List     PAUSE taking these medications    omeprazole  20 MG capsule Wait to take this until: December 29, 2023 Commonly known as: PRILOSEC Take 2 capsules (40 mg total) by mouth daily.       TAKE these medications    acetaminophen  650 MG CR tablet Commonly known as: TYLENOL  Take 1,300 mg by mouth at bedtime as needed for pain.   ALIGN PREBIOTIC-PROBIOTIC PO Take 2 capsules by  mouth daily.   amiodarone  200 MG tablet Commonly known as: PACERONE  Take 1 tablet (200 mg total) by mouth daily. What changed: See the new instructions.   Arnuity Ellipta  50 MCG/ACT Aepb Generic drug: Fluticasone  Furoate Inhale 1 puff by mouth daily.   atorvastatin  40 MG tablet Commonly known as: LIPITOR Take 1 tablet (40 mg total) by mouth in the morning.   CALCIUM  + D3 PO Take 1 tablet by mouth daily.   carvedilol  25 MG tablet Commonly known as: COREG  Take 1 tablet (25 mg total) by mouth 2 (two) times daily.   Centrum MultiGummies Chew Takes 1 gummy by mouth daily   cholecalciferol 1000 units tablet Commonly known as: VITAMIN D Take 1,000 Units by mouth daily.   colchicine  0.6 MG tablet Take 1 tablet (0.6 mg total) by mouth 2 (two) times daily for 5 days.   diclofenac Sodium 1 % Gel Commonly known as: VOLTAREN Apply 1 application topically 2 (two) times daily.   Eliquis  5 MG Tabs tablet Generic drug: apixaban  Take 1 tablet (5 mg total) by mouth 2 (two) times daily.   fexofenadine 180 MG tablet Commonly known as: ALLEGRA Take 180 mg by mouth daily.   fluticasone  50 MCG/ACT nasal spray Commonly known as: FLONASE  Place 2 sprays into both nostrils daily.   furosemide  20 MG tablet Commonly known as: LASIX  Take 1 tablet (20 mg) daily. May take an additional 1 tablet (20 mg) on the days of increase swelling, weight gain of 3 lb overnight or 5 lb in 1 week.   ketoconazole 2 % cream Commonly known as: NIZORAL Apply 1 Application topically daily as needed for irritation.   levothyroxine  75 MCG tablet Commonly known as: SYNTHROID  Take 1 tablet (75 mcg total) by mouth every morning on an empty stomach.   magnesium  oxide 400 (240 Mg) MG tablet Commonly known as: MAG-OX Take 1 tablet (400 mg total) by mouth daily.   meclizine  25 MG tablet Commonly known as: ANTIVERT  Take 25 mg by mouth 3 (three) times daily as needed for dizziness.   metroNIDAZOLE  0.75 %  cream Commonly known as: METROCREAM  Apply a small ammount topically to face 2 (two) times daily. May decrease to once daily as tolerated   mometasone 0.1 % cream Commonly known as: ELOCON Apply 1 application  topically 2 (two) times daily as needed (skin irritation).   nystatin powder Commonly known as: MYCOSTATIN/NYSTOP Apply 1 Application topically as needed (irritation).   OSTEO BI-FLEX REGULAR STRENGTH PO Take 1 tablet by mouth daily.   pantoprazole  40 MG tablet Commonly known as: Protonix  Take 1 tablet (40 mg total) by mouth daily.   potassium chloride  SA 20 MEQ tablet Commonly known as: KLOR-CON  M Take 1 tablet (20 mEq total) by mouth 2 (two) times daily.   sertraline  25 MG tablet Commonly known as: ZOLOFT  Take 1 tablet (25 mg total) by mouth daily.        Disposition:  Home with usual follow up as in AVS  Duration of Discharge Encounter:  APP  Time: 39  Signed, Breyon Blass, NP Electrophysiology 11/14/23 10:45 AM

## 2023-11-13 NOTE — Anesthesia Procedure Notes (Signed)
 Procedure Name: Intubation Date/Time: 11/13/2023 2:06 PM  Performed by: Harrold Macintosh, CRNAPre-anesthesia Checklist: Patient identified, Emergency Drugs available, Suction available and Patient being monitored Patient Re-evaluated:Patient Re-evaluated prior to induction Oxygen Delivery Method: Circle system utilized Preoxygenation: Pre-oxygenation with 100% oxygen Induction Type: IV induction Ventilation: Mask ventilation without difficulty Laryngoscope Size: Miller and 2 Grade View: Grade III Tube type: Oral Tube size: 6.5 mm Number of attempts: 1 Airway Equipment and Method: Stylet Placement Confirmation: ETT inserted through vocal cords under direct vision, positive ETCO2 and breath sounds checked- equal and bilateral Secured at: 21 cm Tube secured with: Tape Dental Injury: Teeth and Oropharynx as per pre-operative assessment  Difficulty Due To: Difficult Airway- due to anterior larynx and Difficult Airway- due to limited oral opening Comments: Grade III view w/BURP maneuver, Limited AOJ extension, redundant tissue.

## 2023-11-13 NOTE — Discharge Instructions (Addendum)
 WATCHMANT (and Atrial Fibrillation ablation) Procedure, Care After  Procedure MD: Dr. Cindie Olds Clinical Coordinator: Rockie Redman, RN  This sheet gives you information about how to care for yourself after your procedure. Your health care provider may also give you more specific instructions. If you have problems or questions, contact your health care provider.  What can I expect after the procedure? After the procedure, it is common to have: Bruising around your puncture site. Tenderness around your puncture site. Tiredness (fatigue).  Medication instructions It is very important to continue to take your blood thinner as directed by your doctor after the Watchman procedure. Call your procedure doctor's office with question or concerns. If you are on Coumadin (warfarin), you will have your INR checked the week after your procedure, with a goal INR of 2.0 - 3.0. Please follow your medication instructions on your discharge summary. Only take the medications listed on your discharge paperwork. You have been started on two new medications - colchicine  and protonix . These are only to be taken briefly after your ablation procedure.   Follow up You will be seen in 6 weeks after your procedure You will have a repeat CT scan or Echocardiogram approximately 8 weeks after your procedure mark to check your device You will follow up the MD/APP who performed your procedure 6 months after your procedure The Watchman Clinical Coordinator will check in with you from time to time, including 1 and 2 years after your procedure.  NO DENTAL CLEANINGS FOR 45 days. After that, you will require antibiotics for dental procedures the first 6 months.   Follow these instructions at home: Puncture site care  Follow instructions from your health care provider about how to take care of your puncture site. Make sure you: If present, leave stitches (sutures), skin glue, or adhesive strips in place.  If a large  square bandage is present, this may be removed 24 hours after surgery.  Check your puncture site every day for signs of infection. Check for: Redness, swelling, or pain. Fluid or blood. If your puncture site starts to bleed, lie down on your back, apply firm pressure to the area, and contact your health care provider. Warmth. Pus or a bad smell. Driving Do not drive yourself home if you received sedation Do not drive for at least 4 days after your procedure or however long your health care provider recommends. (Do not resume driving if you have previously been instructed not to drive for other health reasons.) Do not spend greater than 1 hour at a time in a car for the first 3 days. Stop and take a break with a 5 minute walk at least every hour.  Do not drive or use heavy machinery while taking prescription pain medicine.  Activity Avoid activities that take a lot of effort, including exercise, for at least 7 days after your procedure. For the first 3 days, avoid sitting for longer than one hour at a time.  Avoid alcoholic beverages, signing paperwork, or participating in legal proceedings for 24 hours after receiving sedation Do not lift anything that is heavier than 10 lb (4.5 kg) for one week.  No sexual activity for 1 week.  Return to your normal activities as told by your health care provider. Ask your health care provider what activities are safe for you. General instructions Take over-the-counter and prescription medicines only as told by your health care provider. Do not use any products that contain nicotine or tobacco, such as cigarettes and e-cigarettes.  If you need help quitting, ask your health care provider. You may shower after 24 hours, but Do not take baths, swim, or use a hot tub for 1 week.  Do not drink alcohol for 24 hours after your procedure. Keep all follow-up visits as told by your health care provider. This is important. Dental Work: You will require antibiotics  prior to any dental work, including cleanings, for 6 months after your Watchman implantation to help protect you from infection. After 6 months, antibiotics are no longer required. Contact a health care provider if: You have redness, mild swelling, or pain around your puncture site. You have soreness in your throat or at your puncture site that does not improve after several days You have fluid or blood coming from your puncture site that stops after applying firm pressure to the area. Your puncture site feels warm to the touch. You have pus or a bad smell coming from your puncture site. You have a fever. You have chest pain or discomfort that spreads to your neck, jaw, or arm. You are sweating a lot. You feel nauseous. You have a fast or irregular heartbeat. You have shortness of breath. You are dizzy or light-headed and feel the need to lie down. You have pain or numbness in the arm or leg closest to your puncture site. Get help right away if: Your puncture site suddenly swells. Your puncture site is bleeding and the bleeding does not stop after applying firm pressure to the area. These symptoms may represent a serious problem that is an emergency. Do not wait to see if the symptoms will go away. Get medical help right away. Call your local emergency services (911 in the [place] Do not drive yourself to the hospital. Summary After the procedure, it is normal to have bruising and tenderness at the puncture site in your groin, neck, or forearm. Check your puncture site every day for signs of infection. Get help right away if your puncture site is bleeding and the bleeding does not stop after applying firm pressure to the area. This is a medical emergency.  This information is not intended to replace advice given to you by your health care provider. Make sure you discuss any questions you have with your health care provider.

## 2023-11-13 NOTE — Progress Notes (Signed)
 Pt received from cath lab. VS stable, BP 139/64   Pulse (!) 57   Temp (!) 97.5 F (36.4 C) (Oral)   Resp 11   Ht 5' 5 (1.651 m)   Wt 99.8 kg   SpO2 96%   BMI 36.61 kg/m  with femoral sites level zero, dressings intact. Pt oriented to room, tele placed, call bell within reach.

## 2023-11-13 NOTE — Interval H&P Note (Signed)
 History and Physical Interval Note:  11/13/2023 12:10 PM  Leslie [name]  has presented today for surgery, with the diagnosis of afib.  The various methods of treatment have been discussed with the patient and family. After consideration of risks, benefits and other options for treatment, the patient has consented to  Procedure(s): ATRIAL FIBRILLATION ABLATION (N/A) LEFT ATRIAL APPENDAGE OCCLUSION (N/A) TRANSESOPHAGEAL ECHOCARDIOGRAM (N/A) as a surgical intervention.  The patient's history has been reviewed, patient examined, no change in status, stable for surgery.  I have reviewed the patient's chart and labs.  Questions were answered to the patient's satisfaction.     [name] T Karlea Mckibbin

## 2023-11-13 NOTE — Progress Notes (Signed)
 As part of Watchman FLX pro procedure a pre-procedural transesophageal echocardiogram was attempted. Attempt with an X8 probe did not leave to successful passing of probe. A glidescope was used for visual assessment.  Cardiac anesthesia attempted to place probe. Probe was unable to be passed.  An X 11 pro was attempted. Glidoscope was used. Probe was unable to be passed. Suction was used. Residual soft tissue bleeding after attempt. After discussion with operator- TEE portion of the case was aborted.  [name] MD

## 2023-11-13 NOTE — Anesthesia Preprocedure Evaluation (Signed)
 Anesthesia Evaluation  Patient identified by MRN, date of birth, ID band Patient awake    Reviewed: Allergy & Precautions, NPO status , Patient's Chart, lab work & pertinent test results, reviewed documented beta blocker date and time   History of Anesthesia Complications (+) PONV and history of anesthetic complications  Airway Mallampati: II  TM Distance: >3 FB     Dental no notable dental hx.    Pulmonary neg shortness of breath, asthma , sleep apnea and Continuous Positive Airway Pressure Ventilation , neg COPD, former smoker   breath sounds clear to auscultation       Cardiovascular hypertension, +CHF  (-) Past MI, (-) Cardiac Stents, (-) CABG and (-) Peripheral Vascular Disease + dysrhythmias Atrial Fibrillation + Valvular Problems/Murmurs (mod MR, mod-severe TR) MR  Rhythm:Regular Rate:Normal  IMPRESSIONS     1. Left ventricular ejection fraction, by estimation, is 55 to 60%. The  left ventricle has normal function. The left ventricle has no regional  wall motion abnormalities.   2. Right ventricular systolic function is normal. The right ventricular  size is mildly enlarged. There is mildly elevated pulmonary artery  systolic pressure. The estimated right ventricular systolic pressure is  42.7 mmHg.   3. Windsock morphology of the left atrial appendage. Measurements of left  atrial appendage for possible Watchman device: 0 deg diameter 2.5 cm/depth  2.4 cm, 45 deg diameter 2.1 cm/depth 2.6 cm, 90 deg 2.4 cm/depth 2.4 cm,  135 deg diameter 2.5 cm/depth  2.5 cm. Appears appropriate for a 27 mm Watchman FLX device (11%  compression) or 31 mm device (23% compression). Left atrial size was  moderately dilated. No left atrial/left atrial appendage thrombus was  detected. The LAA emptying velocity was 40 cm/s.   4. Mitral valve area is 4.4 cm sq by planimetry. Posterior leaflet length  is 12 mm. Vena contracta 4.5 mm. By the  PISA method, the effective  regurgitant orifice area is 0.2 cm sq, regurgitant volume is 39 ml,  regurgitant fraction is 33%. The mitral  valve is normal in structure. Moderate mitral valve regurgitation. No  evidence of mitral stenosis. The mean mitral valve gradient is 1.8 mmHg  with average heart rate of 57 bpm.   5. Tricuspid valve regurgitation is moderate to severe.   6. The aortic valve is tricuspid. There is mild thickening of the aortic  valve. Aortic valve regurgitation is mild to moderate. Aortic valve  sclerosis is present, with no evidence of aortic valve stenosis.   7. 3D performed of the mitral valve and demonstrates moderate  functional/secondary MR.      Neuro/Psych  Headaches, neg Seizures    GI/Hepatic hiatal hernia,GERD  ,,(+) neg Cirrhosis        Endo/Other  Hypothyroidism    Renal/GU CRFRenal disease     Musculoskeletal  (+) Arthritis ,    Abdominal   Peds  Hematology  (+) Blood dyscrasia, anemia   Anesthesia Other Findings   Reproductive/Obstetrics                              Anesthesia Physical Anesthesia Plan  ASA: 3  Anesthesia Plan:    Post-op Pain Management:    Induction:   PONV Risk Score and Plan: 3 and Ondansetron  and Dexamethasone   Airway Management Planned: Oral ETT  Additional Equipment:   Intra-op Plan:   Post-operative Plan: Extubation in OR  Informed Consent: I have reviewed the patients History  and Physical, chart, labs and discussed the procedure including the risks, benefits and alternatives for the proposed anesthesia with the patient or authorized representative who has indicated his/her understanding and acceptance.     Dental advisory given  Plan Discussed with: CRNA  Anesthesia Plan Comments:          Anesthesia Quick Evaluation

## 2023-11-13 NOTE — Transfer of Care (Signed)
 Immediate Anesthesia Transfer of Care Note  Patient: Leslie [name]  Procedure(s) Performed: ATRIAL FIBRILLATION ABLATION LEFT ATRIAL APPENDAGE OCCLUSION TRANSESOPHAGEAL ECHOCARDIOGRAM  Patient Location: Cath Lab  Anesthesia Type:General  Level of Consciousness: awake, alert , and oriented  Airway & Oxygen Therapy: Patient Spontanous Breathing  Post-op Assessment: Report given to RN, Post -op Vital signs reviewed and stable, Patient moving all extremities X 4, and Patient able to stick tongue midline  Post vital signs: Reviewed and stable  Last Vitals:  Vitals Value Taken Time  BP 147/65 11/13/23 16:46  Temp 97.6   Pulse 58 11/13/23 16:50  Resp 23 11/13/23 16:50  SpO2 96 % 11/13/23 16:50  Vitals shown include unfiled device data.  Last Pain:  Vitals:   11/13/23 1039  TempSrc:   PainSc: 3       Patients Stated Pain Goal: 1 (11/13/23 1039)  Complications: There were no known notable events for this encounter.

## 2023-11-14 ENCOUNTER — Other Ambulatory Visit (HOSPITAL_COMMUNITY): Payer: Self-pay

## 2023-11-14 MED ORDER — CHLORHEXIDINE GLUCONATE CLOTH 2 % EX PADS
6.0000 | MEDICATED_PAD | Freq: Every day | CUTANEOUS | Status: DC
  Administered 2023-11-14: 6 via TOPICAL

## 2023-11-14 MED ORDER — AMIODARONE HCL 200 MG PO TABS: 200.0000 mg | ORAL_TABLET | Freq: Every day | ORAL | Status: DC

## 2023-11-14 MED ORDER — MUPIROCIN 2 % EX OINT
1.0000 | TOPICAL_OINTMENT | Freq: Two times a day (BID) | CUTANEOUS | Status: DC
  Administered 2023-11-14: 1 via NASAL
  Filled 2023-11-14: qty 22

## 2023-11-14 MED ORDER — COLCHICINE 0.6 MG PO TABS
0.6000 mg | ORAL_TABLET | Freq: Two times a day (BID) | ORAL | 0 refills | Status: DC
  Filled 2023-11-14: qty 10, 5d supply, fill #0

## 2023-11-14 MED ORDER — PANTOPRAZOLE SODIUM 40 MG PO TBEC
40.0000 mg | DELAYED_RELEASE_TABLET | Freq: Every day | ORAL | 0 refills | Status: DC
  Filled 2023-11-14: qty 45, 45d supply, fill #0

## 2023-11-14 NOTE — Progress Notes (Signed)
   11/14/23 1003  TOC Brief Assessment  Insurance and Status Reviewed  Patient has primary care physician Yes  Home environment has been reviewed home  Prior level of function: self  Prior/Current Home Services No current home services  Social Drivers of Health Review SDOH reviewed no interventions necessary  Readmission risk has been reviewed Yes  Transition of care needs no transition of care needs at this time    Pt s/p Watchman device. Stable for transition home today. No HH or DME needs noted. Family to transport home

## 2023-11-14 NOTE — Progress Notes (Signed)
 Reviewed AVS, patient expressed understanding of medications, MD follow up reviewed.   Removed IV, Site clean, dry and intact.  Patient states all belongings brought to the hospital at time of admission are accounted for and packed to take home.  Picked up medications from [facility] pharmacy. Pt transported to entrance A where family member was waiting in vehicle to transport home.

## 2023-11-14 NOTE — Anesthesia Postprocedure Evaluation (Signed)
 Anesthesia Post Note  Patient: Leslie [name]  Procedure(s) Performed: ATRIAL FIBRILLATION ABLATION LEFT ATRIAL APPENDAGE OCCLUSION TRANSESOPHAGEAL ECHOCARDIOGRAM     Patient location during evaluation: PACU Anesthesia Type: General Level of consciousness: awake and alert Pain management: pain level controlled Vital Signs Assessment: post-procedure vital signs reviewed and stable Respiratory status: spontaneous breathing, nonlabored ventilation, respiratory function stable and patient connected to nasal cannula oxygen Cardiovascular status: blood pressure returned to baseline and stable Postop Assessment: no apparent nausea or vomiting Anesthetic complications: no   There were no known notable events for this encounter.  Last Vitals:  Vitals:   11/14/23 0334 11/14/23 0558  BP: (!) 144/50   Pulse: 66   Resp: 20 14  Temp: 36.6 C   SpO2: 98%     Last Pain:  Vitals:   11/14/23 0424  TempSrc:   PainSc: Asleep                 [name] [name]

## 2023-11-15 ENCOUNTER — Telehealth: Payer: Self-pay | Admitting: Cardiology

## 2023-11-15 NOTE — Telephone Encounter (Signed)
 Telephone outpatient note  Patient ID: Leslie [name] MRN: [id number]; DOB: Aug 01, 1945 [facility] Providers Cardiologist:  Jerel Balding, MD  Electrophysiologist:  Leslie ONEIDA HOLTS, MD       Leslie [name] is a 78 y.o. female with a recent ablation/watchman on Thursday.   Patient called in reporting severe diarrhea for 8-9 hours today. Reports drinking gatorade at home, but has lost a large amount of fluids due to her diarrhea. She reports being prescribed 2 new medications: colchicine  0.6mg  bid and pantoprazole  40mg  every day that she thinks is causing her symptoms. She took imodium x 3 this afternoon without significant improvement.   Denies any chest pain, lightheadedness, dizziness, syncope.   Discussed that the colchicine  can cause diarrhea and would recommend stop taking this medication and to continue oral hydration. Recommended to stop taking lasix  daily (she took this AM), until her diarrhea improved/resolved.   Recommended that if patient were to have worsening diarrhea, fevers, chills, lightheadedness/dizziness, syncope, to present to a local ER for further evaluation.   Will forward to patient's EP doctor, Dr. HOLTS.  Leslie [name] Leslie [name]  10:25 PM 11/15/23    Current Outpatient Medications  Medication Instructions   acetaminophen  (TYLENOL ) 1,300 mg, At bedtime PRN   amiodarone  (PACERONE ) 200 mg, Oral, Daily   atorvastatin  (LIPITOR) 40 mg, Oral, Every morning   Bacillus Coagulans-Inulin (ALIGN PREBIOTIC-PROBIOTIC PO) 2 capsules, Daily   Calcium  Carb-Cholecalciferol (CALCIUM  + D3 PO) 1 tablet, Daily   carvedilol  (COREG ) 25 mg, Oral, 2 times daily   cholecalciferol (VITAMIN D) 1,000 Units, Daily   colchicine  0.6 mg, Oral, 2 times daily   diclofenac Sodium (VOLTAREN) 1 % GEL 1 application , 2 times daily   Eliquis  5 mg, Oral, 2 times daily   fexofenadine (ALLEGRA) 180 mg, Daily   fluticasone  (FLONASE ) 50 MCG/ACT nasal spray 2 sprays, Each Nare, Daily    Fluticasone  Furoate (ARNUITY ELLIPTA ) 50 MCG/ACT AEPB Inhale 1 puff by mouth daily.   furosemide  (LASIX ) 20 MG tablet Take 1 tablet (20 mg) daily. May take an additional 1 tablet (20 mg) on the days of increase swelling, weight gain of 3 lb overnight or 5 lb in 1 week.   Glucosamine-Chondroitin (OSTEO BI-FLEX REGULAR STRENGTH PO) 1 tablet, Daily   ketoconazole (NIZORAL) 2 % cream 1 Application, Daily PRN   levothyroxine  (SYNTHROID ) 75 MCG tablet Take 1 tablet (75 mcg total) by mouth every morning on an empty stomach.   magnesium  oxide (MAG-OX) 400 mg, Oral, Daily   meclizine  (ANTIVERT ) 25 mg, 3 times daily PRN   metroNIDAZOLE  (METROCREAM ) 0.75 % cream Apply a small ammount topically to face 2 (two) times daily. May decrease to once daily as tolerated   mometasone (ELOCON) 0.1 % cream 1 application , 2 times daily PRN   Multiple Vitamins-Minerals (CENTRUM MULTIGUMMIES) CHEW Takes 1 gummy by mouth daily   nystatin (MYCOSTATIN/NYSTOP) powder 1 Application, As needed   [Paused] omeprazole  (PRILOSEC) 40 mg, Oral, Daily   pantoprazole  (PROTONIX ) 40 mg, Oral, Daily   potassium chloride  SA (KLOR-CON  M) 20 MEQ tablet 20 mEq, Oral, 2 times daily   sertraline  (ZOLOFT ) 25 mg, Oral, Daily   Allergies  Allergen Reactions   Clindamycin Hcl     Lip and face swelling   Augmentin [Amoxicillin-Pot Clavulanate] Diarrhea        Codeine Nausea Only and Other (See Comments)    Hyperactivity   Demerol [Meperidine] Nausea And Vomiting   Sulfa Antibiotics Rash     For  questions or updates, please contact [facility] Please consult [email or web address] for contact info under    Leslie [name] Leslie Cluster, MD  11/15/2023 10:17 PM

## 2023-11-17 ENCOUNTER — Telehealth: Payer: Self-pay

## 2023-11-17 DIAGNOSIS — Z95818 Presence of other cardiac implants and grafts: Secondary | ICD-10-CM

## 2023-11-17 DIAGNOSIS — I4819 Other persistent atrial fibrillation: Secondary | ICD-10-CM

## 2023-11-17 DIAGNOSIS — R197 Diarrhea, unspecified: Secondary | ICD-10-CM | POA: Diagnosis not present

## 2023-11-17 DIAGNOSIS — I482 Chronic atrial fibrillation, unspecified: Secondary | ICD-10-CM | POA: Diagnosis not present

## 2023-11-17 NOTE — Telephone Encounter (Signed)
 Tentatively scheduled the patient for post-LAAO CT on 01/14/2024. She understands imaging is dependent on lab results at 9/15 visit.

## 2023-11-17 NOTE — Telephone Encounter (Signed)
  [facility]   Watchman Team  Contacted the patient regarding discharge from [facility] on 11/14/2023 after concomitant ablation/Watchman procedure  The patient understands to follow up with Daphne Barrack on 12/29/2023  The patient understands discharge instructions? Yes  The patient understands medications and regimen? Yes   The patient reports groin sites look healthy. She had an appointment with her PCP today and was told sites look good.  The patient understands to call with any questions or concerns prior to scheduled visit.   On Saturday, the patient had severe, explosive diarrhea for 8-10 hours. She called the on-call provider and was told to stop colchicine  and lasix  until diarrhea resolves. The diarrhea resolved quickly after stopping the meds. The patient saw her PCP, Dr. Chrystal, today. She ordered labs and instructed the patient to resume Lasix .  Confirmed upcoming appointment with the patient and instructed her to call with any problems prior to that visit. She was grateful for assistance.

## 2023-11-20 ENCOUNTER — Telehealth: Payer: Self-pay

## 2023-11-20 ENCOUNTER — Other Ambulatory Visit: Payer: Self-pay

## 2023-11-20 DIAGNOSIS — D509 Iron deficiency anemia, unspecified: Secondary | ICD-10-CM

## 2023-11-20 NOTE — Telephone Encounter (Signed)
 The patient reported continued stomach discomfort since her procedures last week. While diarrhea has improved, indigestion has gotten worse and her stomach feels uneasy. She was on omeprazole  previously. Instructed her to switch back to omeprazole  and to call in a few days if symptoms don't resolve. She was grateful for call and agreed with plan.

## 2023-11-21 DIAGNOSIS — G4733 Obstructive sleep apnea (adult) (pediatric): Secondary | ICD-10-CM | POA: Diagnosis not present

## 2023-11-24 ENCOUNTER — Other Ambulatory Visit (HOSPITAL_BASED_OUTPATIENT_CLINIC_OR_DEPARTMENT_OTHER): Payer: Self-pay

## 2023-11-24 ENCOUNTER — Telehealth: Payer: Self-pay | Admitting: Medical Oncology

## 2023-11-24 ENCOUNTER — Other Ambulatory Visit: Payer: Self-pay

## 2023-11-24 NOTE — Telephone Encounter (Signed)
 PCP told her that her iron  level has dropped.

## 2023-11-25 ENCOUNTER — Encounter: Payer: Self-pay | Admitting: Physician Assistant

## 2023-11-25 ENCOUNTER — Other Ambulatory Visit (HOSPITAL_BASED_OUTPATIENT_CLINIC_OR_DEPARTMENT_OTHER): Payer: Self-pay

## 2023-11-25 ENCOUNTER — Telehealth: Payer: Self-pay | Admitting: Medical Oncology

## 2023-11-25 NOTE — Telephone Encounter (Signed)
 Pt appt made. LVM at PCP to fax recent labs from [facility] .

## 2023-11-26 ENCOUNTER — Telehealth: Payer: Self-pay

## 2023-11-26 ENCOUNTER — Inpatient Hospital Stay (HOSPITAL_BASED_OUTPATIENT_CLINIC_OR_DEPARTMENT_OTHER): Admitting: Internal Medicine

## 2023-11-26 ENCOUNTER — Inpatient Hospital Stay: Attending: Internal Medicine

## 2023-11-26 ENCOUNTER — Other Ambulatory Visit (HOSPITAL_BASED_OUTPATIENT_CLINIC_OR_DEPARTMENT_OTHER): Payer: Self-pay

## 2023-11-26 VITALS — BP 105/56 | HR 65 | Temp 98.0°F | Resp 18 | Ht 65.0 in | Wt 225.0 lb

## 2023-11-26 DIAGNOSIS — Z79899 Other long term (current) drug therapy: Secondary | ICD-10-CM | POA: Diagnosis not present

## 2023-11-26 DIAGNOSIS — D509 Iron deficiency anemia, unspecified: Secondary | ICD-10-CM | POA: Diagnosis not present

## 2023-11-26 DIAGNOSIS — D500 Iron deficiency anemia secondary to blood loss (chronic): Secondary | ICD-10-CM

## 2023-11-26 DIAGNOSIS — D508 Other iron deficiency anemias: Secondary | ICD-10-CM

## 2023-11-26 LAB — CBC WITH DIFFERENTIAL (CANCER CENTER ONLY)
Abs Immature Granulocytes: 0.02 K/uL (ref 0.00–0.07)
Basophils Absolute: 0.1 K/uL (ref 0.0–0.1)
Basophils Relative: 1 %
Eosinophils Absolute: 0.1 K/uL (ref 0.0–0.5)
Eosinophils Relative: 2 %
HCT: 27.8 % — ABNORMAL LOW (ref 36.0–46.0)
Hemoglobin: 8.3 g/dL — ABNORMAL LOW (ref 12.0–15.0)
Immature Granulocytes: 0 %
Lymphocytes Relative: 15 %
Lymphs Abs: 1 K/uL (ref 0.7–4.0)
MCH: 23.9 pg — ABNORMAL LOW (ref 26.0–34.0)
MCHC: 29.9 g/dL — ABNORMAL LOW (ref 30.0–36.0)
MCV: 80.1 fL (ref 80.0–100.0)
Monocytes Absolute: 0.5 K/uL (ref 0.1–1.0)
Monocytes Relative: 8 %
Neutro Abs: 4.9 K/uL (ref 1.7–7.7)
Neutrophils Relative %: 74 %
Platelet Count: 254 K/uL (ref 150–400)
RBC: 3.47 MIL/uL — ABNORMAL LOW (ref 3.87–5.11)
RDW: 17.7 % — ABNORMAL HIGH (ref 11.5–15.5)
WBC Count: 6.6 K/uL (ref 4.0–10.5)
nRBC: 0 % (ref 0.0–0.2)

## 2023-11-26 LAB — IRON AND IRON BINDING CAPACITY (CC-WL,HP ONLY)
Iron: 27 ug/dL — ABNORMAL LOW (ref 28–170)
Saturation Ratios: 6 % — ABNORMAL LOW (ref 10.4–31.8)
TIBC: 472 ug/dL — ABNORMAL HIGH (ref 250–450)
UIBC: 445 ug/dL — ABNORMAL HIGH (ref 148–442)

## 2023-11-26 LAB — TYPE AND SCREEN
ABO/RH(D): O NEG
Antibody Screen: NEGATIVE

## 2023-11-26 LAB — FERRITIN: Ferritin: 13 ng/mL (ref 11–307)

## 2023-11-26 MED ORDER — LEVOTHYROXINE SODIUM 75 MCG PO TABS
75.0000 ug | ORAL_TABLET | Freq: Every morning | ORAL | 3 refills | Status: AC
  Filled 2023-11-26 (×2): qty 90, 90d supply, fill #0
  Filled 2024-03-16: qty 90, 90d supply, fill #1

## 2023-11-26 NOTE — Progress Notes (Signed)
 [facility] [phone]   [phone]  OFFICE PROGRESS NOTE  [name], MD [address] [place] [address]  DIAGNOSIS: Iron  deficiency anemia.  PRIOR THERAPY: Iron  infusion with Venofer  300 Mg IV every week x3 weeks.  Last dose was given in March 2025  CURRENT THERAPY: None  INTERVAL HISTORY: Leslie [name] 78 y.o. female returns to the clinic today for follow-up visit. Discussed the use of AI scribe software for clinical note transcription with the patient, who gave verbal consent to proceed.  History of Present Illness Leslie [name] Leslie [name] is a 78 year old female with iron  deficiency anemia who presents with a drop in hemoglobin levels.  She has experienced a significant drop in hemoglobin levels, decreasing from 9.7 g/dL on August 28th to 8.3 g/dL. She has not been taking any supplements recently.  She has undergone several procedures, including a cardioversion, transesophageal echocardiogram (TEE), an ablation, and the placement of a Watchman device. Additionally, she has had a pill endoscopy and a colonoscopy, but no source of bleeding was identified. Her valve was noted to be leaking slightly.  She experiences symptoms related to her anemia, including shortness of breath, fatigue, and occasional dizziness described as 'wooziness.' No allergic reactions or issues with previous iron  infusions, which she has received at [facility].  She has been on observation and visits on an as-needed basis.     MEDICAL HISTORY: Past Medical History:  Diagnosis Date   Adenomatous polyp    Anemia    Arthritis    knees, legs, fingers (07/30/2016)   Asthma    Bursitis of left shoulder    just finished PT (07/30/2016)   Chest pain    a. 2003 Abnl stress test-->Cath: nonobs CAD.   CHF (congestive heart failure) (HCC)    Chronic bronchitis (HCC)    Chronic kidney disease    stage 3   GERD (gastroesophageal reflux  disease)    barrett's esophagus- Dr. Donnald   History of hiatal hernia    Hyperlipidemia    Hypertension    Hypothyroidism    Migraines    sporatic; at least a few/year (07/30/2016)   Mitral regurgitation    a. 07/2016 Echo: mild to mod MR;  b. 07/2016 TEE: mild MR.   Moderate aortic insufficiency    a. 07/2016 Echo: EF 55-60%, mod AI;  b. 07/2016 TEE: EF 50-55%, mod AI.   Obesity    s/p lap band surgery 6/10   OSA on CPAP    PAF (paroxysmal atrial fibrillation) (HCC)    a. 07/2016 TEE/DCCV: EF 50-55%, mild MR, mod AI, mild to mod TR, neg bubble study-->successful DCCV x1 w/ 120J.   PONV (postoperative nausea and vomiting)    Vitamin D deficiency     ALLERGIES:  is allergic to colchicine , clindamycin hcl, augmentin [amoxicillin-pot clavulanate], codeine, demerol [meperidine], and sulfa antibiotics.  MEDICATIONS:  Current Outpatient Medications  Medication Sig Dispense Refill   acetaminophen  (TYLENOL ) 650 MG CR tablet Take 1,300 mg by mouth at bedtime as needed for pain.     amiodarone  (PACERONE ) 200 MG tablet Take 1 tablet (200 mg total) by mouth daily.     apixaban  (ELIQUIS ) 5 MG TABS tablet Take 1 tablet (5 mg total) by mouth 2 (two) times daily. 180 tablet 1   atorvastatin  (LIPITOR) 40 MG tablet Take 1 tablet (40 mg total) by mouth in the morning. 90 tablet 3   Bacillus Coagulans-Inulin (ALIGN  PREBIOTIC-PROBIOTIC PO) Take 2 capsules by mouth daily.     Calcium  Carb-Cholecalciferol (CALCIUM  + D3 PO) Take 1 tablet by mouth daily.     carvedilol  (COREG ) 25 MG tablet Take 1 tablet (25 mg total) by mouth 2 (two) times daily. 180 tablet 3   cholecalciferol (VITAMIN D) 1000 UNITS tablet Take 1,000 Units by mouth daily.     diclofenac Sodium (VOLTAREN) 1 % GEL Apply 1 application topically 2 (two) times daily.     fexofenadine (ALLEGRA) 180 MG tablet Take 180 mg by mouth daily.     fluticasone  (FLONASE ) 50 MCG/ACT nasal spray Place 2 sprays into both nostrils daily. 16 g 5   Fluticasone   Furoate (ARNUITY ELLIPTA ) 50 MCG/ACT AEPB Inhale 1 puff by mouth daily. 90 each 3   furosemide  (LASIX ) 20 MG tablet Take 1 tablet (20 mg) daily. May take an additional 1 tablet (20 mg) on the days of increase swelling, weight gain of 3 lb overnight or 5 lb in 1 week. 180 tablet 3   Glucosamine-Chondroitin (OSTEO BI-FLEX REGULAR STRENGTH PO) Take 1 tablet by mouth daily.     ketoconazole (NIZORAL) 2 % cream Apply 1 Application topically daily as needed for irritation.     levothyroxine  (SYNTHROID ) 75 MCG tablet Take 1 tablet (75 mcg total) by mouth every morning on an empty stomach. 90 tablet 0   magnesium  oxide (MAG-OX) 400 (240 Mg) MG tablet Take 1 tablet (400 mg total) by mouth daily. 120 tablet 1   meclizine  (ANTIVERT ) 25 MG tablet Take 25 mg by mouth 3 (three) times daily as needed for dizziness.     metroNIDAZOLE  (METROCREAM ) 0.75 % cream Apply a small ammount topically to face 2 (two) times daily. May decrease to once daily as tolerated 45 g 3   mometasone (ELOCON) 0.1 % cream Apply 1 application  topically 2 (two) times daily as needed (skin irritation).     Multiple Vitamins-Minerals (CENTRUM MULTIGUMMIES) CHEW Takes 1 gummy by mouth daily     nystatin (MYCOSTATIN/NYSTOP) powder Apply 1 Application topically as needed (irritation).     omeprazole  (PRILOSEC) 20 MG capsule Take 2 capsules (40 mg total) by mouth daily. 180 capsule 0   potassium chloride  SA (KLOR-CON  M) 20 MEQ tablet Take 1 tablet (20 mEq total) by mouth 2 (two) times daily. 180 tablet 3   sertraline  (ZOLOFT ) 25 MG tablet Take 1 tablet (25 mg total) by mouth daily. 90 tablet 3   No current facility-administered medications for this visit.    SURGICAL HISTORY:  Past Surgical History:  Procedure Laterality Date   APPENDECTOMY     incidental   ATRIAL FIBRILLATION ABLATION N/A 11/13/2023   Procedure: ATRIAL FIBRILLATION ABLATION;  Surgeon: Cindie Ole DASEN, MD;  Location: [facility] INVASIVE CV LAB;  Service: Cardiovascular;   Laterality: N/A;   CARDIOVERSION N/A 08/01/2016   Procedure: CARDIOVERSION;  Surgeon: Oneil [name] Parchment, MD;  Location: [facility] ENDOSCOPY;  Service: Cardiovascular;  Laterality: N/A;   CARDIOVERSION N/A 06/24/2019   Procedure: CARDIOVERSION;  Surgeon: Francyne Headland, MD;  Location: [facility] ENDOSCOPY;  Service: Cardiovascular;  Laterality: N/A;   CARDIOVERSION N/A 05/30/2020   Procedure: CARDIOVERSION;  Surgeon: Francyne Headland, MD;  Location: [facility] ENDOSCOPY;  Service: Cardiovascular;  Laterality: N/A;   CARDIOVERSION N/A 05/09/2020   Procedure: CARDIOVERSION;  Surgeon: Kate Lonni CROME, MD;  Location: [facility] ENDOSCOPY;  Service: Cardiovascular;  Laterality: N/A;   CARDIOVERSION N/A 08/18/2023   Procedure: CARDIOVERSION;  Surgeon: Mona Vinie [name], MD;  Location: [facility] INVASIVE CV  LAB;  Service: Cardiovascular;  Laterality: N/A;   CARDIOVERSION N/A 10/13/2023   Procedure: CARDIOVERSION;  Surgeon: Santo Stanly LABOR, MD;  Location: [facility] INVASIVE CV LAB;  Service: Cardiovascular;  Laterality: N/A;   CATARACT EXTRACTION W/ INTRAOCULAR LENS  IMPLANT, BILATERAL Bilateral ~ 2008-2017   left - right   COMBINED HYSTERECTOMY VAGINAL / OOPHORECTOMY / A&P REPAIR  1981   ESOPHAGOGASTRODUODENOSCOPY N/A 07/08/2023   Procedure: EGD (ESOPHAGOGASTRODUODENOSCOPY);  Surgeon: Saintclair Jasper, MD;  Location: [facility] ENDOSCOPY;  Service: Gastroenterology;  Laterality: N/A;   GIVENS CAPSULE STUDY N/A 07/08/2023   Procedure: IMAGING PROCEDURE, GI TRACT, INTRALUMINAL, VIA CAPSULE;  Surgeon: Saintclair Jasper, MD;  Location: [facility] ENDOSCOPY;  Service: Gastroenterology;  Laterality: N/A;   HERNIA REPAIR     INGUINAL HERNIA REPAIR Left 1952   LAPAROSCOPIC GASTRIC BANDING WITH HIATAL HERNIA REPAIR  09/2008   LEFT ATRIAL APPENDAGE OCCLUSION N/A 11/13/2023   Procedure: LEFT ATRIAL APPENDAGE OCCLUSION;  Surgeon: Cindie Ole DASEN, MD;  Location: [facility] INVASIVE CV LAB;  Service: Cardiovascular;  Laterality: N/A;   RIGHT/LEFT HEART CATH AND CORONARY ANGIOGRAPHY N/A  04/11/2022   Procedure: RIGHT/LEFT HEART CATH AND CORONARY ANGIOGRAPHY;  Surgeon: Dann Candyce RAMAN, MD;  Location: [facility] INVASIVE CV LAB;  Service: Cardiovascular;  Laterality: N/A;   TEE WITHOUT CARDIOVERSION N/A 08/01/2016   Procedure: TRANSESOPHAGEAL ECHOCARDIOGRAM (TEE);  Surgeon: Oneil [name] Parchment, MD;  Location: [facility] ENDOSCOPY;  Service: Cardiovascular;  Laterality: N/A;   TONSILLECTOMY AND ADENOIDECTOMY  1974   TRANSESOPHAGEAL ECHOCARDIOGRAM (CATH LAB) N/A 09/01/2023   Procedure: TRANSESOPHAGEAL ECHOCARDIOGRAM;  Surgeon: Francyne Headland, MD;  Location: [facility] INVASIVE CV LAB;  Service: Cardiovascular;  Laterality: N/A;   TRANSESOPHAGEAL ECHOCARDIOGRAM (CATH LAB) N/A 11/13/2023   Procedure: TRANSESOPHAGEAL ECHOCARDIOGRAM;  Surgeon: Cindie Ole DASEN, MD;  Location: [facility] INVASIVE CV LAB;  Service: Cardiovascular;  Laterality: N/A;   VAGINAL HYSTERECTOMY  1981   w/1 ovary    REVIEW OF SYSTEMS:  A comprehensive review of systems was negative except for: Constitutional: positive for fatigue Neurological: positive for dizziness   PHYSICAL EXAMINATION: General appearance: alert, cooperative, fatigued, and no distress Head: Normocephalic, without obvious abnormality, atraumatic Neck: no adenopathy, no JVD, supple, symmetrical, trachea midline, and thyroid  not enlarged, symmetric, no tenderness/mass/nodules Lymph nodes: Cervical, supraclavicular, and axillary nodes normal. Resp: clear to auscultation bilaterally Back: symmetric, no curvature. ROM normal. No CVA tenderness. Cardio: regular rate and rhythm, S1, S2 normal, no murmur, click, rub or gallop GI: soft, non-tender; bowel sounds normal; no masses,  no organomegaly Extremities: extremities normal, atraumatic, no cyanosis or edema  ECOG PERFORMANCE STATUS: 1 - Symptomatic but completely ambulatory  Blood pressure (!) 105/56, pulse 65, temperature 98 F (36.7 C), temperature source Temporal, resp. rate 18, height 5' 5 (1.651 m), weight 225 lb  (102.1 kg), SpO2 100%.  LABORATORY DATA: Lab Results  Component Value Date   WBC 6.6 11/26/2023   HGB 8.3 (L) 11/26/2023   HCT 27.8 (L) 11/26/2023   MCV 80.1 11/26/2023   PLT 254 11/26/2023      Chemistry      Component Value Date/Time   NA 136 11/04/2023 1127   K 5.1 11/04/2023 1127   CL 100 11/04/2023 1127   CO2 20 11/04/2023 1127   BUN 20 11/04/2023 1127   CREATININE 1.33 (H) 11/04/2023 1127   CREATININE 1.31 (H) 05/29/2023 0846   CREATININE 1.49 (H) 08/13/2016 1556      Component Value Date/Time   CALCIUM  8.7 11/04/2023 1127   ALKPHOS 66 05/31/2023 1358  AST 23 05/31/2023 1358   AST 18 05/29/2023 0846   ALT 23 05/31/2023 1358   ALT 20 05/29/2023 0846   BILITOT 1.1 05/31/2023 1358   BILITOT 0.8 05/29/2023 0846       RADIOGRAPHIC STUDIES: EP STUDY Result Date: 11/13/2023 CONCLUSIONS: 1. Successful PVI 2. Successful ablation/isolation of the posterior wall 3. Successful Watchman device implant (left atrial appendage occlusion) 4. Intracardiac echo reveals trivial pericardial effusion, normal left atrial architecture 5. No early apparent complications. 6. Colchicine  0.6mg  PO BID x 5 days 7. Protonix  40mg  PO daily x 45 days Post Implant Anticoagulation Strategy: Continue Eliquis  5mg  by mouth twice daily for 90 days. After 90 days, stop Eliquis  and start Plavix 75mg  by mouth once daily to complete 6 months of post implant medical therapy. Plan for CT (if renal function allows) after 60 days to assess appendage patency and Watchman position.   ASSESSMENT AND PLAN: This is a very pleasant 78 years old white female with history of microcytic anemia secondary to iron  deficiency from an adequate oral intake.  The patient has intolerance to the oral iron  tablets. She was treated in the past with iron  infusion with Venofer  300 Mg IV weekly for 3 weeks during the last 3 weeks of March 2025. Her preference is not to schedule regular appointment with hematology but see us  on  as-needed basis based on her lab results with her primary care physician or cardiologist.  The patient mentioned that she has too many doctors and she would like to see us  only as needed. She had repeat CBC performed earlier today that showed hemoglobin of 8.3 and hematocrit 27.8%. Assessment and Plan Assessment & Plan Iron  deficiency anemia Chronic iron  deficiency anemia with a recent decrease in hemoglobin from 9.7 g/dL in July to 8.3 g/dL currently. The etiology remains unexplained despite extensive testing, including pill endoscopy and colonoscopy, which did not reveal any source of bleeding. She reports symptoms of dyspnea, fatigue, and occasional dizziness, likely related to the anemia. No allergic reactions to previous iron  infusions were reported. Iron  studies are pending to confirm low iron  levels. - Order iron  studies to confirm low iron  levels. - Administer iron  infusion at [facility] if iron  studies confirm low iron  levels. - Schedule follow-up appointment in three months to reassess hemoglobin levels. She was advised to call immediately if she has any other concerning symptoms in the interval.  The patient voices understanding of current disease status and treatment options and is in agreement with the current care plan.  All questions were answered. The patient knows to call the clinic with any problems, questions or concerns. We can certainly see the patient much sooner if necessary.   Disclaimer: This note was dictated with voice recognition software. Similar sounding words can inadvertently be transcribed and may not be corrected upon review.

## 2023-11-26 NOTE — Telephone Encounter (Signed)
 Dr. Sherrod, patient will be scheduled as soon as possible.  Auth Submission: NO AUTH NEEDED Site of care: Site of care: [facility] Payer: [employer] Medication & CPT/J Code(s) submitted: Venofer  (Iron  Sucrose) J1756 Diagnosis Code:  Route of submission (phone, fax, portal):  Phone # Fax # Auth type: Buy/Bill PB Units/visits requested: 300mg  x 3 doses Reference number:  Approval from: 11/26/23 to 04/14/24

## 2023-11-27 ENCOUNTER — Other Ambulatory Visit: Payer: Self-pay | Admitting: Cardiovascular Disease

## 2023-11-27 ENCOUNTER — Other Ambulatory Visit (HOSPITAL_BASED_OUTPATIENT_CLINIC_OR_DEPARTMENT_OTHER): Payer: Self-pay

## 2023-11-27 ENCOUNTER — Telehealth: Payer: Self-pay | Admitting: Internal Medicine

## 2023-11-27 MED FILL — Amiodarone HCl Tab 200 MG: ORAL | 60 days supply | Qty: 90 | Fill #0 | Status: AC

## 2023-11-27 NOTE — Telephone Encounter (Signed)
 Scheduled appointments per LOS, spoke with patient and she is aware of the day and times of her appointments.

## 2023-12-01 ENCOUNTER — Encounter: Payer: Self-pay | Admitting: Family Medicine

## 2023-12-03 ENCOUNTER — Other Ambulatory Visit (HOSPITAL_BASED_OUTPATIENT_CLINIC_OR_DEPARTMENT_OTHER): Payer: Self-pay

## 2023-12-03 ENCOUNTER — Ambulatory Visit (INDEPENDENT_AMBULATORY_CARE_PROVIDER_SITE_OTHER)

## 2023-12-03 VITALS — BP 104/62 | HR 54 | Temp 97.7°F | Resp 18 | Ht 65.5 in | Wt 233.0 lb

## 2023-12-03 DIAGNOSIS — D509 Iron deficiency anemia, unspecified: Secondary | ICD-10-CM | POA: Diagnosis not present

## 2023-12-03 DIAGNOSIS — D500 Iron deficiency anemia secondary to blood loss (chronic): Secondary | ICD-10-CM

## 2023-12-03 MED ORDER — SODIUM CHLORIDE 0.9 % IV SOLN
300.0000 mg | INTRAVENOUS | Status: DC
  Administered 2023-12-03: 300 mg via INTRAVENOUS
  Filled 2023-12-03: qty 15

## 2023-12-03 NOTE — Progress Notes (Signed)
 Diagnosis: Iron  Deficiency Anemia  Provider:  Praveen Mannam MD  Procedure: IV Infusion  IV Type: Peripheral, IV Location: L Antecubital  Venofer  (Iron  Sucrose), Dose: 300 mg  Infusion Start Time: 1129  Infusion Stop Time: 1317  Post Infusion IV Care: Patient declined observation and Peripheral IV Discontinued  Discharge: Condition: Good, Destination: Home . AVS Declined  Performed by:  Derian Pfost, RN

## 2023-12-10 ENCOUNTER — Ambulatory Visit

## 2023-12-10 VITALS — BP 106/60 | HR 51 | Temp 97.6°F | Resp 16 | Ht 65.5 in | Wt 232.2 lb

## 2023-12-10 DIAGNOSIS — D509 Iron deficiency anemia, unspecified: Secondary | ICD-10-CM

## 2023-12-10 DIAGNOSIS — D500 Iron deficiency anemia secondary to blood loss (chronic): Secondary | ICD-10-CM

## 2023-12-10 MED ORDER — SODIUM CHLORIDE 0.9 % IV SOLN
300.0000 mg | INTRAVENOUS | Status: DC
  Administered 2023-12-10: 300 mg via INTRAVENOUS
  Filled 2023-12-10: qty 15

## 2023-12-10 NOTE — Progress Notes (Signed)
 Diagnosis: Iron  Deficiency Anemia  Provider:  Praveen Mannam MD  Procedure: IV Infusion  IV Type: Peripheral, IV Location: L Antecubital  Venofer  (Iron  Sucrose), Dose: 300 mg  Infusion Start Time: 1112  Infusion Stop Time: 1256  Post Infusion IV Care: Patient declined observation and Peripheral IV Discontinued  Discharge: Condition: Good, Destination: Home . AVS Declined  Performed by:  Rafaella Kole, RN

## 2023-12-17 ENCOUNTER — Ambulatory Visit (INDEPENDENT_AMBULATORY_CARE_PROVIDER_SITE_OTHER)

## 2023-12-17 VITALS — BP 120/70 | HR 50 | Temp 97.7°F | Resp 16 | Ht 65.0 in | Wt 232.8 lb

## 2023-12-17 DIAGNOSIS — D509 Iron deficiency anemia, unspecified: Secondary | ICD-10-CM | POA: Diagnosis not present

## 2023-12-17 DIAGNOSIS — D500 Iron deficiency anemia secondary to blood loss (chronic): Secondary | ICD-10-CM

## 2023-12-17 MED ORDER — SODIUM CHLORIDE 0.9 % IV SOLN
300.0000 mg | INTRAVENOUS | Status: DC
  Administered 2023-12-17: 300 mg via INTRAVENOUS
  Filled 2023-12-17: qty 15

## 2023-12-17 NOTE — Progress Notes (Signed)
 Diagnosis: Iron  Deficiency Anemia  Provider:  Praveen Mannam MD  Procedure: IV Infusion  IV Type: Peripheral, IV Location: R Antecubital  Venofer  (Iron  Sucrose), Dose: 300 mg  Infusion Start Time: 1117  Infusion Stop Time: 1300  Post Infusion IV Care: Observation period completed and Peripheral IV Discontinued  Discharge: Condition: Good, Destination: Home . AVS Declined  Performed by:  Cleophas Yoak, RN

## 2023-12-22 ENCOUNTER — Other Ambulatory Visit (HOSPITAL_BASED_OUTPATIENT_CLINIC_OR_DEPARTMENT_OTHER): Payer: Self-pay

## 2023-12-22 DIAGNOSIS — R310 Gross hematuria: Secondary | ICD-10-CM | POA: Diagnosis not present

## 2023-12-22 DIAGNOSIS — R300 Dysuria: Secondary | ICD-10-CM | POA: Diagnosis not present

## 2023-12-22 MED ORDER — CEPHALEXIN 500 MG PO CAPS
500.0000 mg | ORAL_CAPSULE | Freq: Two times a day (BID) | ORAL | 0 refills | Status: DC
  Filled 2023-12-22: qty 14, 7d supply, fill #0

## 2023-12-28 NOTE — Progress Notes (Addendum)
 Electrophysiology Office Note:   Date:  12/29/2023  ID:  Leslie [name], DOB [birth year], MRN [id number]  Primary Cardiologist: Jerel Balding, MD Primary Heart Failure: None Electrophysiologist: OLE ONEIDA HOLTS, MD      History of Present Illness:   Leslie [name] is a 78 y.o. female with h/o AF, VHD: moderate MR / moderate AI, HTN, HFpEF, HTN, CKD IIIa, OSA on CPAP, symptomatic anemia seen today for routine electrophysiology follow-up s/p Ablation/LAAO occlusion.  Since last being seen in our clinic the patient reports doing well. States the procedure was great. She took colchicine  and had explosive diarrhea after that lasted 36h.  She had to stop taking it. After she had acid reflux and restarted her prilosec.  Since has been good. She understands her renal function may preclude CT and she may need TEE.   She reports some degree of shortness of breath and LE swelling. She has been taking her daily lasix .  Is concerned about her anemia (receiving transfusions).   She denies chest pain, palpitations, dyspnea, PND, orthopnea, nausea, vomiting, dizziness, syncope, edema, weight gain, or early satiety.    Review of systems complete and found to be negative unless listed in HPI.   EP Information / Studies Reviewed:    EKG is ordered today. Personal review as below.  EKG Interpretation Date/Time:  Monday December 29 2023 13:25:41 EDT Ventricular Rate:  54 PR Interval:  194 QRS Duration:  102 QT Interval:  452 QTC Calculation: 428 R Axis:   72  Text Interpretation: Sinus bradycardia Confirmed by Leslie [name] ([id number]) on 12/29/2023 1:36:58 PM    Arrhythmia / AAD / Pertinent EP Studies AF DCCV 10/13/23 > 200j > SR Amiodarone  01/2022 >  EPS/LAAO 11/13/23 > PVI + PW ablation, LAAO implant   Risk Assessment/Calculations:    CHA2DS2-VASc Score = 5   This indicates a 7.2% annual risk of stroke. The patient's score is based upon: CHF History: 1 HTN History: 1 Diabetes History:  0 Stroke History: 0 Vascular Disease History: 0 Age Score: 2 Gender Score: 1             Physical Exam:   VS:  BP 108/64   Pulse (!) 54   Ht 5' 5 (1.651 m)   Wt 230 lb (104.3 kg)   SpO2 98%   BMI 38.27 kg/m    Wt Readings from Last 3 Encounters:  12/29/23 230 lb (104.3 kg)  12/17/23 232 lb 12.8 oz (105.6 kg)  12/10/23 232 lb 3.2 oz (105.3 kg)     GEN: Well nourished, well developed in no acute distress NECK: No JVD; No carotid bruits CARDIAC: Regular rate and rhythm, no murmurs, rubs, gallops RESPIRATORY:  Clear to auscultation without rales, wheezing or rhonchi  ABDOMEN: Soft, non-tender, non-distended EXTREMITIES:  No edema; No deformity   ASSESSMENT AND PLAN:    Persistent Atrial Fibrillation  High Risk Medication: Amiodarone   S/p LAAO  CHA2DS2-VASc 5, s/p PVI+PW ablation with LAAO 11/13/2023 -EKG with NSR   -continue amiodarone  200 mg daily, plan to complete through 3 months post ablation (instructed to stop after 02/11/24 > discussed with Dr. HOLTS) -update amio labs  -continue Coreg  25 mg BID -post implant anticoagulation strategy > Eliquis  5mg  BID x90 days (through 02/11/24), then Plavix  75 mg daily to complete medical therapy (through 05/16/23) -pt instructed to stop prilosec and start protonix  around 01/28/24 (prilosec and plavix  are not compatible)  -CT scan at 60 days to evaluate device patency / position (pending renal  function) -pre-CT labs combined with amio labs > CMP, TSH, free T4   -pt concerned about her anemia, SOB > add CBC  -Dental SBE > amox allergic.  Doxycycline  100 mg 60 minutes before dental procedure  Secondary Hypercoagulable State  -continue Eliquis  / Plavix  as above to complete medical therapy post Watchman   IDA  -per primary   Follow up with EP APP Jan 29   Signed, Daphne Barrack, NP-C, AGACNP-BC [facility] - Electrophysiology  12/29/2023, 1:37 PM

## 2023-12-29 ENCOUNTER — Other Ambulatory Visit: Payer: Self-pay

## 2023-12-29 ENCOUNTER — Other Ambulatory Visit (HOSPITAL_BASED_OUTPATIENT_CLINIC_OR_DEPARTMENT_OTHER): Payer: Self-pay

## 2023-12-29 ENCOUNTER — Ambulatory Visit: Attending: Pulmonary Disease | Admitting: Pulmonary Disease

## 2023-12-29 ENCOUNTER — Encounter: Payer: Self-pay | Admitting: Pulmonary Disease

## 2023-12-29 VITALS — BP 108/64 | HR 54 | Ht 65.0 in | Wt 230.0 lb

## 2023-12-29 DIAGNOSIS — D500 Iron deficiency anemia secondary to blood loss (chronic): Secondary | ICD-10-CM | POA: Diagnosis not present

## 2023-12-29 DIAGNOSIS — Z95818 Presence of other cardiac implants and grafts: Secondary | ICD-10-CM

## 2023-12-29 DIAGNOSIS — I4819 Other persistent atrial fibrillation: Secondary | ICD-10-CM

## 2023-12-29 MED ORDER — DOXYCYCLINE HYCLATE 100 MG PO CAPS
ORAL_CAPSULE | ORAL | 0 refills | Status: DC
  Filled 2023-12-29: qty 2, 2d supply, fill #0

## 2023-12-29 MED ORDER — CLOPIDOGREL BISULFATE 75 MG PO TABS
75.0000 mg | ORAL_TABLET | Freq: Every day | ORAL | 0 refills | Status: DC
  Filled 2023-12-29: qty 90, 90d supply, fill #0

## 2023-12-29 NOTE — Patient Instructions (Signed)
 Medication Instructions:  1.Take doxycycline  100 mg, 1 hour prior to dental visits 2.Continue eliquis  through PM dose February 11, 2024. Then start plavix  75 mg daily on February 12, 2024 take through May 15, 2024 3.Stop taking your prilosec on January 28, 2024 and begin protonix  40 mg daily-contact primary care should you need more of this medication 4.Stop amiodarone  on February 11, 2024 *If you need a refill on your cardiac medications before your next appointment, please call your pharmacy*  Lab Work: CMET, CBC, TSH, FreeT4-TODAY If you have labs (blood work) drawn today and your tests are completely normal, you will receive your results only by: MyChart Message (if you have MyChart) OR A paper copy in the mail If you have any lab test that is abnormal or we need to change your treatment, we will call you to review the results.  Follow-Up: At [facility], you and your health needs are our priority.  As part of our continuing mission to provide you with exceptional heart care, our providers are all part of one team.  This team includes your primary Cardiologist (physician) and Advanced Practice Providers or APPs (Physician Assistants and Nurse Practitioners) who all work together to provide you with the care you need, when you need it.  Your next appointment:   May 13, 2024-We are not able to schedule this today but we will send you a letter reminding you to call us  to schedule within the next few weeks once the schedule is open   Provider:   Ozell Jodie Passey, PA-C

## 2023-12-30 ENCOUNTER — Ambulatory Visit: Payer: Self-pay | Admitting: Pulmonary Disease

## 2023-12-30 LAB — CBC
Hematocrit: 32.7 % — ABNORMAL LOW (ref 34.0–46.6)
Hemoglobin: 9.5 g/dL — ABNORMAL LOW (ref 11.1–15.9)
MCH: 24.9 pg — ABNORMAL LOW (ref 26.6–33.0)
MCHC: 29.1 g/dL — ABNORMAL LOW (ref 31.5–35.7)
MCV: 86 fL (ref 79–97)
Platelets: 233 x10E3/uL (ref 150–450)
RBC: 3.81 x10E6/uL (ref 3.77–5.28)
RDW: 20.6 % — ABNORMAL HIGH (ref 11.7–15.4)
WBC: 7.5 x10E3/uL (ref 3.4–10.8)

## 2023-12-30 LAB — COMPREHENSIVE METABOLIC PANEL WITH GFR
ALT: 22 IU/L (ref 0–32)
AST: 25 IU/L (ref 0–40)
Albumin: 4.4 g/dL (ref 3.8–4.8)
Alkaline Phosphatase: 100 IU/L (ref 49–135)
BUN/Creatinine Ratio: 18 (ref 12–28)
BUN: 24 mg/dL (ref 8–27)
Bilirubin Total: 0.7 mg/dL (ref 0.0–1.2)
CO2: 20 mmol/L (ref 20–29)
Calcium: 8.8 mg/dL (ref 8.7–10.3)
Chloride: 103 mmol/L (ref 96–106)
Creatinine, Ser: 1.31 mg/dL — AB (ref 0.57–1.00)
Globulin, Total: 2.3 g/dL (ref 1.5–4.5)
Glucose: 94 mg/dL (ref 70–99)
Potassium: 5 mmol/L (ref 3.5–5.2)
Sodium: 138 mmol/L (ref 134–144)
Total Protein: 6.7 g/dL (ref 6.0–8.5)
eGFR: 42 mL/min/1.73 — AB (ref >59)

## 2023-12-30 LAB — T4, FREE: Free T4: 1.85 ng/dL — AB (ref 0.82–1.77)

## 2023-12-30 LAB — TSH: TSH: 4.47 u[IU]/mL (ref 0.450–4.500)

## 2024-01-09 ENCOUNTER — Encounter (HOSPITAL_COMMUNITY): Payer: Self-pay

## 2024-01-13 ENCOUNTER — Other Ambulatory Visit: Payer: Self-pay

## 2024-01-13 ENCOUNTER — Other Ambulatory Visit (HOSPITAL_BASED_OUTPATIENT_CLINIC_OR_DEPARTMENT_OTHER): Payer: Self-pay

## 2024-01-13 ENCOUNTER — Other Ambulatory Visit: Payer: Self-pay | Admitting: Cardiovascular Disease

## 2024-01-13 DIAGNOSIS — I4819 Other persistent atrial fibrillation: Secondary | ICD-10-CM

## 2024-01-13 MED ORDER — APIXABAN 5 MG PO TABS
5.0000 mg | ORAL_TABLET | Freq: Two times a day (BID) | ORAL | 1 refills | Status: DC
  Filled 2024-01-13: qty 60, 30d supply, fill #0

## 2024-01-13 NOTE — Telephone Encounter (Signed)
 Eliquis  5mg  refill request received. Patient is 78 years old, weight-104.3kg, Crea-1.31 on 12/29/23, Diagnosis-Afib, and last seen by Daphne Barrack on 12/29/23. Dose is appropriate based on dosing criteria. Will send in refill to requested pharmacy.

## 2024-01-14 ENCOUNTER — Ambulatory Visit (HOSPITAL_COMMUNITY)
Admission: RE | Admit: 2024-01-14 | Discharge: 2024-01-14 | Disposition: A | Discharge status: Home / Self Care | Source: Ambulatory Visit | Attending: Cardiovascular Disease | Admitting: Cardiovascular Disease

## 2024-01-14 ENCOUNTER — Other Ambulatory Visit (HOSPITAL_BASED_OUTPATIENT_CLINIC_OR_DEPARTMENT_OTHER): Payer: Self-pay

## 2024-01-14 DIAGNOSIS — Z95818 Presence of other cardiac implants and grafts: Secondary | ICD-10-CM | POA: Diagnosis not present

## 2024-01-14 DIAGNOSIS — I4819 Other persistent atrial fibrillation: Secondary | ICD-10-CM | POA: Insufficient documentation

## 2024-01-14 MED ORDER — IOHEXOL 350 MG/ML SOLN
80.0000 mL | Freq: Once | INTRAVENOUS | Status: AC | PRN
  Administered 2024-01-14: 80 mL via INTRAVENOUS

## 2024-01-15 ENCOUNTER — Ambulatory Visit: Payer: Self-pay | Admitting: Cardiology

## 2024-01-29 DIAGNOSIS — D509 Iron deficiency anemia, unspecified: Secondary | ICD-10-CM | POA: Diagnosis not present

## 2024-02-02 ENCOUNTER — Ambulatory Visit: Payer: Self-pay | Admitting: Pulmonary Disease

## 2024-02-03 DIAGNOSIS — D649 Anemia, unspecified: Secondary | ICD-10-CM | POA: Diagnosis not present

## 2024-02-03 DIAGNOSIS — I480 Paroxysmal atrial fibrillation: Secondary | ICD-10-CM | POA: Diagnosis not present

## 2024-02-04 ENCOUNTER — Other Ambulatory Visit: Payer: Self-pay | Admitting: Physician Assistant

## 2024-02-05 ENCOUNTER — Telehealth: Payer: Self-pay

## 2024-02-05 ENCOUNTER — Telehealth: Payer: Self-pay | Admitting: Physician Assistant

## 2024-02-05 NOTE — Telephone Encounter (Signed)
 Scheduled appointments with the patient per secure chat. The patient is aware of all appointment details.

## 2024-02-05 NOTE — Telephone Encounter (Signed)
 S/w patient regarding question received from Dr. Linette office.  Attempted to contact Dr. Linette office to clarify questions regarding patient's eliquis  and amiodarone , but had not received a response back. Reached out to patient to inform her that she would need to clarify any changes to her eliquis  and amiodarone  prescriptions with her cardiologist, Dr. Francyne.  Patient verbalized an understanding of the information and confirmed upcoming appointments while on the call.

## 2024-02-06 NOTE — Progress Notes (Signed)
 [facility] OFFICE PROGRESS NOTE  [name], MD [address] [place] [address]  DIAGNOSIS: Iron  Deficiency anemia-intolerance to oral iron  supplements     PRIOR THERAPY:  Iron  infusion with Venofer  300 Mg IV every week x3 weeks.  Last dose was given 12/17/23  CURRENT THERAPY: None  INTERVAL HISTORY: Leslie [name] 78 y.o. female returns to the clinic today for a follow-up visit.  She was last seen in clinic by Dr. Sherrod on 11/26/2023.  She has a history of being lost to follow-up since January 2023 to February 2025.  She has iron  deficiency anemia and has intolerance to oral iron  supplements because it exacerbates her constipation and rectal bleeding/rectal prolapse.  She has received iron  infusions in the past and the most recent being on 12/17/23.  He has seen Dr. Herschell in the past from [facility] and had an upper endoscopy and colonoscopy in the past that was normal except for polyps and hemorrhoids.  There was no stomach ulcers. She states she had capsule endoscopy x2.   In the interval since last being seen, the patient saw her PCP, Dr. Chrystal.  They drew a CBC on 01/29/2024 that showed worsening hemoglobin of 8.7, low MCV at 76.4, ferritin of 17.6. They recommended IV iron  infusions.  The patient was scheduled to be seen in the clinic in November but her appointment was moved earlier. The patient continues to have some occasional bleeding from rectal prolapse.  Of note the patient is on Eliquis  due to A-fib. She is scheduled for a watchman's procedure and ablation soon by cardiology. She is looking forward to this so she will not have to be on a blood thinner.   She experiences significant fatigue and dyspnea with minimal exertion, such as taking two hours to clean the cat litter. Her heart rate increases with activity, and she feels persistently tired and unwell.  No black sticky stools, hemoptysis, or significant bright red bleeding, except  for minor bleeding related to the prolapse.   She is scheduled for IV iron  this Friday on 10/31 and the subsequent 2 Fridays following.    MEDICAL HISTORY: Past Medical History:  Diagnosis Date   Adenomatous polyp    Anemia    Arthritis    knees, legs, fingers (07/30/2016)   Asthma    Bursitis of left shoulder    just finished PT (07/30/2016)   Chest pain    a. 2003 Abnl stress test-->Cath: nonobs CAD.   CHF (congestive heart failure) (HCC)    Chronic bronchitis (HCC)    Chronic kidney disease    stage 3   GERD (gastroesophageal reflux disease)    barrett's esophagus- Dr. Donnald   History of hiatal hernia    Hyperlipidemia    Hypertension    Hypothyroidism    Migraines    sporatic; at least a few/year (07/30/2016)   Mitral regurgitation    a. 07/2016 Echo: mild to mod MR;  b. 07/2016 TEE: mild MR.   Moderate aortic insufficiency    a. 07/2016 Echo: EF 55-60%, mod AI;  b. 07/2016 TEE: EF 50-55%, mod AI.   Obesity    s/p lap band surgery 6/10   OSA on CPAP    PAF (paroxysmal atrial fibrillation) (HCC)    a. 07/2016 TEE/DCCV: EF 50-55%, mild MR, mod AI, mild to mod TR, neg bubble study-->successful DCCV x1 w/ 120J.   PONV (postoperative nausea and vomiting)    Vitamin D deficiency  ALLERGIES:  is allergic to colchicine , clindamycin hcl, augmentin [amoxicillin-pot clavulanate], codeine, demerol [meperidine], and sulfa antibiotics.  MEDICATIONS:  Current Outpatient Medications  Medication Sig Dispense Refill   acetaminophen  (TYLENOL ) 650 MG CR tablet Take 1,300 mg by mouth at bedtime as needed for pain.     amiodarone  (PACERONE ) 200 MG tablet Take 1 tablet (200 mg total) by mouth 2 (two) times daily for 30 days, THEN 1 tablet (200 mg total) daily. 90 tablet 3   apixaban  (ELIQUIS ) 5 MG TABS tablet Take 1 tablet (5 mg total) by mouth 2 (two) times daily. 180 tablet 1   atorvastatin  (LIPITOR) 40 MG tablet Take 1 tablet (40 mg total) by mouth in the morning. 90 tablet 3    Bacillus Coagulans-Inulin (ALIGN PREBIOTIC-PROBIOTIC PO) Take 2 capsules by mouth daily.     Calcium  Carb-Cholecalciferol (CALCIUM  + D3 PO) Take 1 tablet by mouth daily.     carvedilol  (COREG ) 25 MG tablet Take 1 tablet (25 mg total) by mouth 2 (two) times daily. 180 tablet 3   cholecalciferol (VITAMIN D) 1000 UNITS tablet Take 1,000 Units by mouth daily.     [START ON 02/12/2024] clopidogrel  (PLAVIX ) 75 MG tablet Take 1 tablet (75 mg total) by mouth daily. Start taking on February 12, 2024 90 tablet 0   diclofenac Sodium (VOLTAREN) 1 % GEL Apply 1 application topically 2 (two) times daily.     doxycycline  (VIBRAMYCIN ) 100 MG capsule Take 1 tablet by mouth 1 hour prior to dental visits 2 capsule 0   fexofenadine (ALLEGRA) 180 MG tablet Take 180 mg by mouth daily.     fluticasone  (FLONASE ) 50 MCG/ACT nasal spray Place 2 sprays into both nostrils daily. 16 g 5   Fluticasone  Furoate (ARNUITY ELLIPTA ) 50 MCG/ACT AEPB Inhale 1 puff by mouth daily. 90 each 3   furosemide  (LASIX ) 20 MG tablet Take 1 tablet (20 mg) daily. May take an additional 1 tablet (20 mg) on the days of increase swelling, weight gain of 3 lb overnight or 5 lb in 1 week. 180 tablet 3   Glucosamine-Chondroitin (OSTEO BI-FLEX REGULAR STRENGTH PO) Take 1 tablet by mouth daily.     ketoconazole (NIZORAL) 2 % cream Apply 1 Application topically daily as needed for irritation.     levothyroxine  (SYNTHROID ) 75 MCG tablet Take 1 tablet (75 mcg total) by mouth every morning on an empty stomach. 90 tablet 3   magnesium  oxide (MAG-OX) 400 (240 Mg) MG tablet Take 1 tablet (400 mg total) by mouth daily. 120 tablet 1   meclizine  (ANTIVERT ) 25 MG tablet Take 25 mg by mouth 3 (three) times daily as needed for dizziness.     metroNIDAZOLE  (METROCREAM ) 0.75 % cream Apply a small ammount topically to face 2 (two) times daily. May decrease to once daily as tolerated 45 g 3   mometasone (ELOCON) 0.1 % cream Apply 1 application  topically 2 (two) times daily  as needed (skin irritation).     Multiple Vitamins-Minerals (CENTRUM MULTIGUMMIES) CHEW Takes 1 gummy by mouth daily     nystatin (MYCOSTATIN/NYSTOP) powder Apply 1 Application topically as needed (irritation).     omeprazole  (PRILOSEC) 20 MG capsule Take 2 capsules (40 mg total) by mouth daily. 180 capsule 0   potassium chloride  SA (KLOR-CON  M) 20 MEQ tablet Take 1 tablet (20 mEq total) by mouth 2 (two) times daily. 180 tablet 3   sertraline  (ZOLOFT ) 25 MG tablet Take 1 tablet (25 mg total) by mouth daily. 90 tablet 3  No current facility-administered medications for this visit.    SURGICAL HISTORY:  Past Surgical History:  Procedure Laterality Date   APPENDECTOMY     incidental   ATRIAL FIBRILLATION ABLATION N/A 11/13/2023   Procedure: ATRIAL FIBRILLATION ABLATION;  Surgeon: Cindie Ole DASEN, MD;  Location: [facility] INVASIVE CV LAB;  Service: Cardiovascular;  Laterality: N/A;   CARDIOVERSION N/A 08/01/2016   Procedure: CARDIOVERSION;  Surgeon: Oneil [name] Parchment, MD;  Location: [facility] ENDOSCOPY;  Service: Cardiovascular;  Laterality: N/A;   CARDIOVERSION N/A 06/24/2019   Procedure: CARDIOVERSION;  Surgeon: Francyne Headland, MD;  Location: [facility] ENDOSCOPY;  Service: Cardiovascular;  Laterality: N/A;   CARDIOVERSION N/A 05/30/2020   Procedure: CARDIOVERSION;  Surgeon: Francyne Headland, MD;  Location: [facility] ENDOSCOPY;  Service: Cardiovascular;  Laterality: N/A;   CARDIOVERSION N/A 05/09/2020   Procedure: CARDIOVERSION;  Surgeon: Kate Lonni CROME, MD;  Location: [facility] ENDOSCOPY;  Service: Cardiovascular;  Laterality: N/A;   CARDIOVERSION N/A 08/18/2023   Procedure: CARDIOVERSION;  Surgeon: Mona Vinie [name], MD;  Location: [facility] INVASIVE CV LAB;  Service: Cardiovascular;  Laterality: N/A;   CARDIOVERSION N/A 10/13/2023   Procedure: CARDIOVERSION;  Surgeon: Santo Stanly LABOR, MD;  Location: [facility] INVASIVE CV LAB;  Service: Cardiovascular;  Laterality: N/A;   CATARACT EXTRACTION W/ INTRAOCULAR LENS  IMPLANT,  BILATERAL Bilateral ~ 2008-2017   left - right   COMBINED HYSTERECTOMY VAGINAL / OOPHORECTOMY / A&P REPAIR  1981   ESOPHAGOGASTRODUODENOSCOPY N/A 07/08/2023   Procedure: EGD (ESOPHAGOGASTRODUODENOSCOPY);  Surgeon: Saintclair Jasper, MD;  Location: [facility];  Service: Gastroenterology;  Laterality: N/A;   GIVENS CAPSULE STUDY N/A 07/08/2023   Procedure: IMAGING PROCEDURE, GI TRACT, INTRALUMINAL, VIA CAPSULE;  Surgeon: Saintclair Jasper, MD;  Location: [facility] ENDOSCOPY;  Service: Gastroenterology;  Laterality: N/A;   HERNIA REPAIR     INGUINAL HERNIA REPAIR Left 1952   LAPAROSCOPIC GASTRIC BANDING WITH HIATAL HERNIA REPAIR  09/2008   LEFT ATRIAL APPENDAGE OCCLUSION N/A 11/13/2023   Procedure: LEFT ATRIAL APPENDAGE OCCLUSION;  Surgeon: Cindie Ole DASEN, MD;  Location: [facility]VAS[facility] LAB;  Service: Cardiovascular;  Laterality: N/A;   RIGHT/LEFT HEART CATH AND CORONARY ANGIOGRAPHY N/A 04/11/2022   Procedure: RIGHT/LEFT HEART CATH AND CORONARY ANGIOGRAPHY;  Surgeon: Dann Candyce RAMAN, MD;  Location: [facility] INVASIVE CV LAB;  Service: Cardiovascular;  Laterality: N/A;   TEE WITHOUT CARDIOVERSION N/A 08/01/2016   Procedure: TRANSESOPHAGEAL ECHOCARDIOGRAM (TEE);  Surgeon: Oneil [name] Parchment, MD;  Location: [facility] ENDOSCOPY;  Service: Cardiovascular;  Laterality: N/A;   TONSILLECTOMY AND ADENOIDECTOMY  1974   TRANSESOPHAGEAL ECHOCARDIOGRAM (CATH LAB) N/A 09/01/2023   Procedure: TRANSESOPHAGEAL ECHOCARDIOGRAM;  Surgeon: Francyne Headland, MD;  Location: [facility] INVASIVE CV LAB;  Service: Cardiovascular;  Laterality: N/A;   TRANSESOPHAGEAL ECHOCARDIOGRAM (CATH LAB) N/A 11/13/2023   Procedure: TRANSESOPHAGEAL ECHOCARDIOGRAM;  Surgeon: Cindie Ole DASEN, MD;  Location: [facility] INVASIVE CV LAB;  Service: Cardiovascular;  Laterality: N/A;   VAGINAL HYSTERECTOMY  1981   w/1 ovary    REVIEW OF SYSTEMS:   Review of Systems  Constitutional: Positive for fatigue. Negative for appetite change, chills, fever and unexpected weight change.  HENT:  Negative for mouth sores, nosebleeds, sore throat and trouble swallowing.   Eyes: Negative for eye problems and icterus.  Respiratory: Positive for shortness of breath with exertion. Negative for cough, hemoptysis, and wheezing.   Cardiovascular: Negative for chest pain and leg swelling.  Gastrointestinal: Positive for occasional rectal bleeding and prolapse.  Negative for abdominal pain, constipation, diarrhea, nausea and vomiting.  Genitourinary: Negative for bladder incontinence, difficulty urinating,  dysuria, frequency and hematuria.   Musculoskeletal: Negative for back pain, gait problem, neck pain and neck stiffness.  Skin: Negative for itching and rash.  Neurological: Negative for dizziness, extremity weakness, gait problem, headaches, light-headedness and seizures.  Hematological: Negative for adenopathy. Positive for small scattered areas of extremity bruising.  Psychiatric/Behavioral: Negative for confusion, depression and sleep disturbance. The patient is not nervous/anxious.     PHYSICAL EXAMINATION:  Blood pressure (!) 131/46, pulse 62, temperature 98 F (36.7 C), temperature source Temporal, resp. rate 18, height 5' 5 (1.651 m), weight 230 lb (104.3 kg), SpO2 100%.  ECOG PERFORMANCE STATUS: 1  Physical Exam  Constitutional: Oriented to person, place, and time and well-developed, well-nourished, and in no distress.  HENT:  Head: Normocephalic and atraumatic.  Mouth/Throat: Oropharynx is clear and moist. No oropharyngeal exudate.  Eyes: Conjunctivae are normal. Right eye exhibits no discharge. Left eye exhibits no discharge. No scleral icterus.  Neck: Normal range of motion. Neck supple.  Cardiovascular: Normal rate, regular rhythm, normal heart sounds and intact distal pulses.   Pulmonary/Chest: Effort normal and breath sounds normal. No respiratory distress. No wheezes. No rales.  Abdominal: Soft. Bowel sounds are normal. Exhibits no distension and no mass. There is no  tenderness.  Musculoskeletal: Normal range of motion. Exhibits no edema.  Lymphadenopathy:    No cervical adenopathy.  Neurological: Alert and oriented to person, place, and time. Exhibits normal muscle tone. Gait normal. Coordination normal.  Skin: Skin is warm and dry. No rash noted. Not diaphoretic. No erythema. No pallor. Positive for small scattered areas of extremity bruising.  Psychiatric: Mood, memory and judgment normal.  Vitals reviewed.  LABORATORY DATA: Lab Results  Component Value Date   WBC 7.5 12/29/2023   HGB 9.5 (L) 12/29/2023   HCT 32.7 (L) 12/29/2023   MCV 86 12/29/2023   PLT 233 12/29/2023      Chemistry      Component Value Date/Time   NA 138 12/29/2023 1457   K 5.0 12/29/2023 1457   CL 103 12/29/2023 1457   CO2 20 12/29/2023 1457   BUN 24 12/29/2023 1457   CREATININE 1.31 (H) 12/29/2023 1457   CREATININE 1.31 (H) 05/29/2023 0846   CREATININE 1.49 (H) 08/13/2016 1556      Component Value Date/Time   CALCIUM  8.8 12/29/2023 1457   ALKPHOS 100 12/29/2023 1457   AST 25 12/29/2023 1457   AST 18 05/29/2023 0846   ALT 22 12/29/2023 1457   ALT 20 05/29/2023 0846   BILITOT 0.7 12/29/2023 1457   BILITOT 0.8 05/29/2023 0846       RADIOGRAPHIC STUDIES:  CT CARDIAC MORPH/PULM VEIN W/CM&W/O CA SCORE Addendum Date: 01/24/2024 ADDENDUM REPORT: 01/24/2024 17:00 EXAM: OVER-READ INTERPRETATION  CT CHEST The following report is an over-read performed by radiologist Dr. Andrea Gasman of [facility] on 01/24/2024. This over-read does not include interpretation of cardiac or coronary anatomy or pathology. The coronary CTA interpretation by the cardiologist is attached. COMPARISON:  None. FINDINGS: Vascular: No aortic atherosclerosis. The included aorta is normal in caliber. Mediastinum/nodes: No adenopathy or mass. Unremarkable esophagus. Lungs: Mild heterogeneous pulmonary parenchyma. Occasional areas of septal thickening. No focal airspace disease. 2 mm  right middle lobe pulmonary nodule series 305, image 19. Small right pleural effusion. The included airways are patent. Upper abdomen: No acute or unexpected findings.  Gastric band. Musculoskeletal: There are no acute or suspicious osseous abnormalities. Thoracic spondylosis. IMPRESSION: 1. Mild heterogeneous pulmonary parenchyma with occasional areas of septal thickening, suggesting  pulmonary edema. 2. Small right pleural effusion. 3. A 2 mm right middle lobe pulmonary nodule. Per Fleischner Society Guidelines, no routine follow-up imaging is recommended. These guidelines do not apply to immunocompromised patients and patients with cancer. Follow up in patients with significant comorbidities as clinically warranted. For lung cancer screening, adhere to Lung-RADS guidelines. Reference: Radiology. 2017; 284(1):228-43. Electronically Signed   By: Andrea Gasman M.D.   On: 01/24/2024 17:00   Result Date: 01/24/2024 CLINICAL DATA:  S/p 31 mm Watchman FLX 11/13/2023.  Follow-up. EXAM: Cardiac CT/CTA TECHNIQUE: A non-contrast, gated CT scan was obtained with axial slices of 2.5 mm through the heart for calcium  scoring. Calcium  scoring was performed using the Agatston method. A 120 kV prospective, gated, contrast cardiac scan was obtained. Gantry rotation speed was 230 msec and collimation was 0.63 mm. Nitroglycerin was not given. A delayed scan was obtained to exclude left atrial appendage thrombus. The 3D dataset was reconstructed in systole with motion correction. The 3D dataset was reconstructed at 5% intervals of the 25%-50% of the R-R cycle. Images were analyzed on a dedicated workstation using MPR, MIP, and VRT modes. The patient received 95 cc of contrast. FINDINGS: Image quality: excellent. Noise artifact is: Limited. Left Atrium: The left atrial size is dilated. Small iatrogenic ASD related to prior transseptal puncture. There is normal pulmonary vein drainage into the left atrium (2 on the right and 2 on  the left). Left Atrial Appendage: 31 mm Watchman FLX is present in the LAA without device leak or device-related thrombus. Coronary Arteries: CAC score of 196, which is 68th percentile for age-, race-, and sex-matched controls. Normal coronary origin. Right dominance. The study was performed without use of NTG and is insufficient for plaque evaluation. The following assessment was made of the proximal segments: Left main: The left main is a large caliber vessel with a normal take off from the left coronary cusp that bifurcates to form a left anterior descending artery and a left circumflex artery. There is no plaque or stenosis. Left anterior descending artery: Mild calcified plaque in the proximal LAD (25-49%). Left circumflex artery: Minimal CAD in the proximal LCX (<25%). Right coronary artery: The RCA is dominant with normal take off from the right coronary cusp. Minimal calcified plaque in the mid RCA (<25%). Right Atrium: Right atrial size is dilated. Right Ventricle: The right ventricular cavity is dilated. Left Ventricle: The ventricular cavity size is within normal limits. Pulmonary Artery: Dilated pulmonary artery suggestive of pulmonary hypertension. Cardiac valves: The aortic valve is trileaflet without significant calcification. The mitral valve is normal structure without significant calcification. Aorta: Normal caliber with no significant disease. Pericardium: Normal thickness with no significant effusion or calcium  present. Extra-cardiac findings: See attached radiology report for non-cardiac structures. IMPRESSION: 1. 31 mm Watchman FLX is present in the LAA without device leak or device-related thrombus. Successful LAA closure. 2. Small iatrogenic ASD related to prior transseptal puncture. 3. CAC score of 196, which is 68th percentile for age-, race-, and sex-matched controls. 4. Mild calcified plaque in the proximal LAD (25-49%). Minimal CAD in the proximal LCX and mid RCA (<25%). 5. Biatrial  enlargement. 6. Dilated pulmonary artery suggestive of pulmonary hypertension. Darryle T. Barbaraann, MD Electronically Signed: By: Darryle Barbaraann M.D. On: 01/14/2024 14:27     ASSESSMENT/PLAN:  This is a very pleasant 78 year old Caucasian female referred to the clinic for iron  deficiency anemia likely due to rectal bleeding from constipation. She is also on anti-coagulation due to atrial fibrillation.  The patient had blood work performed on 01/29/2024 by her PCPs office which showed Feraheme of 17, hemoglobin of 8.7.    I have arranged for IV iron  infusions with Venofer  300 mg weekly x 3 starting on 02/13/2024.   Will check her lab work when she comes in on 11/11.  Patient would like to avoid blood transfusions if possible because her prior blood transfusion had triggered her atrial fibrillation.  Is expected to have blood work performed at her PCPs office on 03/18/2024.  I gave her our fax number and asked that they fax us  a copy of her labs.  We will then see her a week later and can review her labs and discuss if she requires any additional IV iron  at that time.  She was instructed to increase her dietary intake of iron  rich food if possible  He will continue to follow with cardiology regarding the timing of when to discontinue her Eliquis .  The patient was advised to call immediately if she has any concerning symptoms in the interval. The patient voices understanding of current disease status and treatment options and is in agreement with the current care plan. All questions were answered. The patient knows to call the clinic with any problems, questions or concerns. We can certainly see the patient much sooner if necessary  Orders Placed This Encounter  Procedures   CBC with Differential (Cancer Center Only)    Standing Status:   Future    Expected Date:   02/23/2024    Expiration Date:   02/08/2025   Sample to Blood Bank    Standing Status:   Future    Expected Date:   02/23/2024     Expiration Date:   02/08/2025    The total time spent in the appointment was 20-29 minutes  Keslie Gritz L Nashia Remus, PA-C 02/09/24

## 2024-02-09 ENCOUNTER — Other Ambulatory Visit: Payer: Self-pay | Admitting: Cardiology

## 2024-02-09 ENCOUNTER — Other Ambulatory Visit: Payer: Self-pay

## 2024-02-09 ENCOUNTER — Other Ambulatory Visit (HOSPITAL_BASED_OUTPATIENT_CLINIC_OR_DEPARTMENT_OTHER): Payer: Self-pay

## 2024-02-09 ENCOUNTER — Inpatient Hospital Stay: Attending: Internal Medicine | Admitting: Physician Assistant

## 2024-02-09 ENCOUNTER — Encounter: Payer: Self-pay | Admitting: Physician Assistant

## 2024-02-09 VITALS — BP 131/46 | HR 62 | Temp 98.0°F | Resp 18 | Ht 65.0 in | Wt 230.0 lb

## 2024-02-09 DIAGNOSIS — Z7901 Long term (current) use of anticoagulants: Secondary | ICD-10-CM | POA: Insufficient documentation

## 2024-02-09 DIAGNOSIS — K625 Hemorrhage of anus and rectum: Secondary | ICD-10-CM | POA: Insufficient documentation

## 2024-02-09 DIAGNOSIS — I480 Paroxysmal atrial fibrillation: Secondary | ICD-10-CM | POA: Diagnosis not present

## 2024-02-09 DIAGNOSIS — K5900 Constipation, unspecified: Secondary | ICD-10-CM | POA: Insufficient documentation

## 2024-02-09 DIAGNOSIS — D509 Iron deficiency anemia, unspecified: Secondary | ICD-10-CM | POA: Insufficient documentation

## 2024-02-11 ENCOUNTER — Telehealth: Payer: Self-pay

## 2024-02-11 NOTE — Telephone Encounter (Signed)
 Patient returned called.   Reviewed with her today is her last day of Eliquis  and Amiodarone . Tomorrow, she will not take those medications. Tomorrow, she will start Plavix  75mg  daily. She is also taking pantoprazole  and stopped prilosec. She will keep OV with Dr. Francyne as scheduled.   Medication listed updated.  Patient is also starting iron  infusions for anemia. She saw Cassandra Heilingoetter PA yesterday for this.   She was grateful for the call.

## 2024-02-11 NOTE — Addendum Note (Signed)
 Addended by: WAYLON EDSEL HERO on: 02/11/2024 11:23 AM   Modules accepted: Orders

## 2024-02-11 NOTE — Telephone Encounter (Signed)
 Left voicemail for patient to return call to discuss medication changes post watchman per Daphne Barrack NP office note.

## 2024-02-12 ENCOUNTER — Other Ambulatory Visit (HOSPITAL_BASED_OUTPATIENT_CLINIC_OR_DEPARTMENT_OTHER): Payer: Self-pay

## 2024-02-12 ENCOUNTER — Encounter (HOSPITAL_BASED_OUTPATIENT_CLINIC_OR_DEPARTMENT_OTHER): Payer: Self-pay

## 2024-02-13 ENCOUNTER — Telehealth: Payer: Self-pay

## 2024-02-13 ENCOUNTER — Inpatient Hospital Stay

## 2024-02-13 ENCOUNTER — Other Ambulatory Visit (HOSPITAL_BASED_OUTPATIENT_CLINIC_OR_DEPARTMENT_OTHER): Payer: Self-pay

## 2024-02-13 VITALS — BP 90/77 | HR 53 | Temp 98.0°F | Resp 18

## 2024-02-13 DIAGNOSIS — D509 Iron deficiency anemia, unspecified: Secondary | ICD-10-CM | POA: Diagnosis not present

## 2024-02-13 MED ORDER — SODIUM CHLORIDE 0.9 % IV SOLN: INTRAVENOUS | Status: DC

## 2024-02-13 MED ORDER — PANTOPRAZOLE SODIUM 40 MG PO TBEC
40.0000 mg | DELAYED_RELEASE_TABLET | Freq: Every day | ORAL | 0 refills | Status: AC
  Filled 2024-02-13: qty 90, 90d supply, fill #0

## 2024-02-13 MED ORDER — IRON SUCROSE 300 MG IVPB - SIMPLE MED
300.0000 mg | Freq: Once | Status: AC
  Administered 2024-02-13: 300 mg via INTRAVENOUS
  Filled 2024-02-13: qty 300

## 2024-02-13 NOTE — Telephone Encounter (Signed)
 Patient called in needing prescription of Pantoprazole  40mg  daily. She needed to switch of omeprazole  due to interaction with plavix . 90 day supply sent to her pharmacy.

## 2024-02-13 NOTE — Patient Instructions (Signed)

## 2024-02-16 ENCOUNTER — Other Ambulatory Visit (HOSPITAL_BASED_OUTPATIENT_CLINIC_OR_DEPARTMENT_OTHER): Payer: Self-pay

## 2024-02-16 MED ORDER — NYSTATIN 100000 UNIT/GM EX POWD
1.0000 | Freq: Two times a day (BID) | CUTANEOUS | 0 refills | Status: AC | PRN
  Filled 2024-02-16: qty 60, 30d supply, fill #0

## 2024-02-16 MED ORDER — MOMETASONE FUROATE 0.1 % EX CREA
1.0000 | TOPICAL_CREAM | Freq: Two times a day (BID) | CUTANEOUS | 0 refills | Status: DC | PRN
  Filled 2024-02-16: qty 15, 30d supply, fill #0

## 2024-02-17 ENCOUNTER — Other Ambulatory Visit (HOSPITAL_BASED_OUTPATIENT_CLINIC_OR_DEPARTMENT_OTHER): Payer: Self-pay

## 2024-02-17 MED ORDER — FLUZONE HIGH-DOSE 0.5 ML IM SUSY
0.5000 mL | PREFILLED_SYRINGE | Freq: Once | INTRAMUSCULAR | 0 refills | Status: AC
  Filled 2024-02-17: qty 0.5, 1d supply, fill #0

## 2024-02-20 ENCOUNTER — Inpatient Hospital Stay

## 2024-02-20 ENCOUNTER — Inpatient Hospital Stay: Attending: Internal Medicine

## 2024-02-20 VITALS — BP 134/60 | HR 57 | Temp 98.0°F

## 2024-02-20 DIAGNOSIS — D509 Iron deficiency anemia, unspecified: Secondary | ICD-10-CM

## 2024-02-20 DIAGNOSIS — D508 Other iron deficiency anemias: Secondary | ICD-10-CM

## 2024-02-20 LAB — CBC WITH DIFFERENTIAL (CANCER CENTER ONLY)
Abs Immature Granulocytes: 0.03 K/uL (ref 0.00–0.07)
Basophils Absolute: 0 K/uL (ref 0.0–0.1)
Basophils Relative: 1 %
Eosinophils Absolute: 0.1 K/uL (ref 0.0–0.5)
Eosinophils Relative: 1 %
HCT: 28.9 % — ABNORMAL LOW (ref 36.0–46.0)
Hemoglobin: 8.6 g/dL — ABNORMAL LOW (ref 12.0–15.0)
Immature Granulocytes: 0 %
Lymphocytes Relative: 10 %
Lymphs Abs: 0.7 K/uL (ref 0.7–4.0)
MCH: 24.1 pg — ABNORMAL LOW (ref 26.0–34.0)
MCHC: 29.8 g/dL — ABNORMAL LOW (ref 30.0–36.0)
MCV: 81 fL (ref 80.0–100.0)
Monocytes Absolute: 0.4 K/uL (ref 0.1–1.0)
Monocytes Relative: 6 %
Neutro Abs: 5.5 K/uL (ref 1.7–7.7)
Neutrophils Relative %: 82 %
Platelet Count: 189 K/uL (ref 150–400)
RBC: 3.57 MIL/uL — ABNORMAL LOW (ref 3.87–5.11)
RDW: 21.3 % — ABNORMAL HIGH (ref 11.5–15.5)
WBC Count: 6.8 K/uL (ref 4.0–10.5)
nRBC: 0 % (ref 0.0–0.2)

## 2024-02-20 LAB — SAMPLE TO BLOOD BANK

## 2024-02-20 LAB — FERRITIN: Ferritin: 111 ng/mL (ref 11–307)

## 2024-02-20 LAB — IRON AND IRON BINDING CAPACITY (CC-WL,HP ONLY)
Iron: 32 ug/dL (ref 28–170)
Saturation Ratios: 7 % — ABNORMAL LOW (ref 10.4–31.8)
TIBC: 444 ug/dL (ref 250–450)
UIBC: 412 ug/dL (ref 148–442)

## 2024-02-20 MED ORDER — SODIUM CHLORIDE 0.9 % IV SOLN: INTRAVENOUS | Status: DC

## 2024-02-20 MED ORDER — IRON SUCROSE 300 MG IVPB - SIMPLE MED
300.0000 mg | Freq: Once | Status: AC
  Administered 2024-02-20: 300 mg via INTRAVENOUS
  Filled 2024-02-20: qty 300

## 2024-02-20 NOTE — Patient Instructions (Signed)

## 2024-02-25 ENCOUNTER — Other Ambulatory Visit

## 2024-02-25 ENCOUNTER — Ambulatory Visit: Admitting: Internal Medicine

## 2024-02-27 ENCOUNTER — Inpatient Hospital Stay

## 2024-02-27 VITALS — BP 118/60 | HR 52 | Temp 97.9°F | Resp 18

## 2024-02-27 DIAGNOSIS — D509 Iron deficiency anemia, unspecified: Secondary | ICD-10-CM

## 2024-02-27 MED ORDER — IRON SUCROSE 300 MG IVPB - SIMPLE MED
300.0000 mg | Freq: Once | Status: AC
  Administered 2024-02-27: 300 mg via INTRAVENOUS
  Filled 2024-02-27: qty 300

## 2024-02-27 MED ORDER — SODIUM CHLORIDE 0.9 % IV SOLN: INTRAVENOUS | Status: DC

## 2024-02-27 NOTE — Patient Instructions (Signed)

## 2024-02-27 NOTE — Progress Notes (Signed)
 Patient declined post-iron  infusion observation.  Tolerated treatment well without incident.  VSS at discharge.  Transferred to lobby in wheelchair.

## 2024-03-01 ENCOUNTER — Other Ambulatory Visit (HOSPITAL_BASED_OUTPATIENT_CLINIC_OR_DEPARTMENT_OTHER): Payer: Self-pay

## 2024-03-03 ENCOUNTER — Other Ambulatory Visit (HOSPITAL_BASED_OUTPATIENT_CLINIC_OR_DEPARTMENT_OTHER): Payer: Self-pay

## 2024-03-09 ENCOUNTER — Encounter: Payer: Self-pay | Admitting: Cardiovascular Disease

## 2024-03-09 ENCOUNTER — Ambulatory Visit: Attending: Cardiovascular Disease | Admitting: Cardiovascular Disease

## 2024-03-09 VITALS — BP 115/60 | HR 61 | Resp 17 | Ht 65.0 in | Wt 224.0 lb

## 2024-03-09 DIAGNOSIS — I480 Paroxysmal atrial fibrillation: Secondary | ICD-10-CM | POA: Diagnosis not present

## 2024-03-09 DIAGNOSIS — N1831 Chronic kidney disease, stage 3a: Secondary | ICD-10-CM | POA: Diagnosis not present

## 2024-03-09 DIAGNOSIS — I340 Nonrheumatic mitral (valve) insufficiency: Secondary | ICD-10-CM | POA: Diagnosis not present

## 2024-03-09 DIAGNOSIS — G4733 Obstructive sleep apnea (adult) (pediatric): Secondary | ICD-10-CM | POA: Diagnosis not present

## 2024-03-09 DIAGNOSIS — I4719 Other supraventricular tachycardia: Secondary | ICD-10-CM

## 2024-03-09 DIAGNOSIS — I351 Nonrheumatic aortic (valve) insufficiency: Secondary | ICD-10-CM | POA: Diagnosis not present

## 2024-03-09 DIAGNOSIS — I10 Essential (primary) hypertension: Secondary | ICD-10-CM

## 2024-03-09 DIAGNOSIS — D500 Iron deficiency anemia secondary to blood loss (chronic): Secondary | ICD-10-CM

## 2024-03-09 DIAGNOSIS — I5032 Chronic diastolic (congestive) heart failure: Secondary | ICD-10-CM | POA: Diagnosis not present

## 2024-03-09 NOTE — Patient Instructions (Signed)
 Medication Instructions:  No changes *If you need a refill on your cardiac medications before your next appointment, please call your pharmacy*  Lab Work: None ordered If you have labs (blood work) drawn today and your tests are completely normal, you will receive your results only by: MyChart Message (if you have MyChart) OR A paper copy in the mail If you have any lab test that is abnormal or we need to change your treatment, we will call you to review the results.  Testing/Procedures: None ordered  Follow-Up: At [facility], you and your health needs are our priority.  As part of our continuing mission to provide you with exceptional heart care, our providers are all part of one team.  This team includes your primary Cardiologist (physician) and Advanced Practice Providers or APPs (Physician Assistants and Nurse Practitioners) who all work together to provide you with the care you need, when you need it.  Your next appointment:    EP follow up needed in February- can be EP APP  [name] in 6 mos  We recommend signing up for the patient portal called MyChart.  Sign up information is provided on this After Visit Summary.  MyChart is used to connect with patients for Virtual Visits (Telemedicine).  Patients are able to view lab/test results, encounter notes, upcoming appointments, etc.  Non-urgent messages can be sent to your provider as well.   To learn more about what you can do with MyChart, go to [email or web address].

## 2024-03-11 NOTE — Progress Notes (Signed)
 Cardiology Office Note:    Date:  03/11/2024   ID:  Tamari, Redwine [birth year], MRN [id number]  PCP:  [name], MD  Cardiologist:  Jerel Balding, MD    Referring MD: [name], *   Chief Complaint  Patient presents with   Persistent atrial fibrillation [facility])   Follow-up    6 month     History of Present Illness:    Leslie [name] is a 78 y.o. female with a hx of Persistent atrial fibrillation and diastolic heart failure returning for follow-up.  Additional medical problems include OSA, HTN, prediabetes, HLP, CKD 3A, moderate MR, moderate AI, morbid obesity.  She returns in follow-up after undergoing combined atrial fibrillation ablation and left atrial appendage occluder implantation (31 mm watchman flex) by Dr. Cindie 11/13/2023.  The follow-up CT 01/24/2024 shows no evidence of device leak or thrombus.  She is no longer on anticoagulation.  She has had problems with recurrent anemia, which has been quite symptomatic.  She has recently received iron  infusions and has started to feel a little bit better.  The most recent documented hemoglobin was 8.6 at the beginning of November.  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  She feels fatigue more than shortness of breath and has not had chest pain.  She has occasional palpitations, but these have not been sustained.  She has not had any overt bleeding and she denies falls or injuries.  She reports compliance with CPAP and does not have hypersomnolence.  She has lost a few pounds and BMI is down to 37.  She has good metabolic control with a recent hemoglobin A1c of 5.8%, LDL 81, HDL 49, normal triglycerides.  Her most recent creatinine was slightly elevated at 1.31, roughly average for her over the last 12 months.  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  Cardiac catheterization showed normal coronary arteries.  Flecainide  and verapamil  were discontinued and she was switched to amiodarone  and metoprolol , as well as Entresto .  Follow-up echocardiogram 05/22/2022 showed complete normalization of left ventricular systolic function with EF 65 to 70%.  She most recently required a cardioversion 08/18/2023.  She then underwent a transesophageal echocardiogram on 09/01/2023 to re-evaluate the mitral regurgitation which was still felt to remain moderate.  During the same procedure we performed baseline measurements for the Watchman occluder (average diameter 2.1-2.5 cm, depth 2.4-2.5 cm, windsock morphology, probably best suited for a 27 mm device 11% compression or more likely 31 mm device with 23% compression).    Initially presented with persistent atrial fibrillation in April 2018, had a difficult time with rate control and underwent TEE guided cardioversion.  Beta-blocker dose was decreased in May 2018 when she had junctional rhythm.  After she received her second dose of coronavirus vaccine in February 2021 she developed symptomatic persistent atrial fibrillation with RVR, again challenging to rate control and underwent cardioversion on June 24, 2019.  She then had to undergo cardioversion twice in 2022: First in January, with early recurrence of atrial fibrillation after which flecainide  was started;  second cardioversion in February after which she developed pulmonary edema and required brief hospitalization. In December 2023 she presented with acute systolic heart failure with EF 25-30% and severe hypertension.  Probable Takotsubo syndrome.  Flecainide  and verapamil  were discontinued but she continued on metoprolol   and added Entresto  and amiodarone .  Follow-up echo in February 2024 showed complete normalization of LV function. A nuclear stress test on May 26, 2020 did not show any evidence of ischemia.   Echocardiogram on  June 01, 2020 showed LVEF 55-60% with pseudo normal mitral inflow and no major valvular abnormalities (mild mitral regurgitation and moderate aortic regurgitation, unchanged from the previous one performed in March 2021.  The left atrium is moderately dilated.  LVEF is normal.  Pseudonormal mitral inflow was described, but this was performed relatively soon after her cardioversion0  She has iron  deficiency anemia and is receiving iron  infusions.  She was intolerant of oral iron  due to severe constipation.  Additional problems include morbid obesity, obstructive sleep apnea on CPAP and treated hypertension.     Past Medical History:  Diagnosis Date   Adenomatous polyp    Anemia    Arthritis    knees, legs, fingers (07/30/2016)   Asthma    Bursitis of left shoulder    just finished PT (07/30/2016)   Chest pain    a. 2003 Abnl stress test-->Cath: nonobs CAD.   CHF (congestive heart failure) (HCC)    Chronic bronchitis (HCC)    Chronic kidney disease    stage 3   GERD (gastroesophageal reflux disease)    barrett's esophagus- Dr. Donnald   History of hiatal hernia    Hyperlipidemia    Hypertension    Hypothyroidism    Migraines    sporatic; at least a few/year (07/30/2016)   Mitral regurgitation    a. 07/2016 Echo: mild to mod MR;  b. 07/2016 TEE: mild MR.   Moderate aortic insufficiency    a. 07/2016 Echo: EF 55-60%, mod AI;  b. 07/2016 TEE: EF 50-55%, mod AI.   Obesity    s/p lap band surgery 6/10   OSA on CPAP    PAF (paroxysmal atrial fibrillation) (HCC)    a. 07/2016 TEE/DCCV: EF 50-55%, mild MR, mod AI, mild to mod TR, neg bubble study-->successful DCCV x1 w/ 120J.   PONV (postoperative nausea and vomiting)    Vitamin D deficiency     Past Surgical History:  Procedure Laterality Date   APPENDECTOMY     incidental   ATRIAL FIBRILLATION ABLATION N/A 11/13/2023   Procedure: ATRIAL FIBRILLATION ABLATION;  Surgeon: Cindie Ole DASEN, MD;  Location: [facility] INVASIVE CV LAB;   Service: Cardiovascular;  Laterality: N/A;   CARDIOVERSION N/A 08/01/2016   Procedure: CARDIOVERSION;  Surgeon: Oneil [name] Parchment, MD;  Location: [facility] ENDOSCOPY;  Service: Cardiovascular;  Laterality: N/A;   CARDIOVERSION N/A 06/24/2019   Procedure: CARDIOVERSION;  Surgeon: Francyne Headland, MD;  Location: [facility] ENDOSCOPY;  Service: Cardiovascular;  Laterality: N/A;   CARDIOVERSION N/A 05/30/2020   Procedure: CARDIOVERSION;  Surgeon: Francyne Headland, MD;  Location: [facility] ENDOSCOPY;  Service: Cardiovascular;  Laterality: N/A;   CARDIOVERSION N/A 05/09/2020   Procedure: CARDIOVERSION;  Surgeon: Kate Lonni CROME, MD;  Location: [facility] ENDOSCOPY;  Service: Cardiovascular;  Laterality: N/A;   CARDIOVERSION N/A 08/18/2023   Procedure: CARDIOVERSION;  Surgeon: Mona Vinie [name], MD;  Location: [facility] INVASIVE CV LAB;  Service: Cardiovascular;  Laterality: N/A;   CARDIOVERSION N/A 10/13/2023   Procedure: CARDIOVERSION;  Surgeon: Santo Stanly LABOR, MD;  Location: [facility] INVASIVE CV LAB;  Service: Cardiovascular;  Laterality: N/A;   CATARACT EXTRACTION W/ INTRAOCULAR LENS  IMPLANT, BILATERAL Bilateral ~ 2008-2017   left - right   COMBINED HYSTERECTOMY VAGINAL / OOPHORECTOMY / A&P REPAIR  1981   ESOPHAGOGASTRODUODENOSCOPY N/A 07/08/2023  Procedure: EGD (ESOPHAGOGASTRODUODENOSCOPY);  Surgeon: Saintclair Jasper, MD;  Location: [facility]COPY;  Service: Gastroenterology;  Laterality: N/A;   GIVENS CAPSULE STUDY N/A 07/08/2023   Procedure: IMAGING PROCEDURE, GI TRACT, INTRALUMINAL, VIA CAPSULE;  Surgeon: Saintclair Jasper, MD;  Location: [facility] ENDOSCOPY;  Service: Gastroenterology;  Laterality: N/A;   HERNIA REPAIR     INGUINAL HERNIA REPAIR Left 1952   LAPAROSCOPIC GASTRIC BANDING WITH HIATAL HERNIA REPAIR  09/2008   LEFT ATRIAL APPENDAGE OCCLUSION N/A 11/13/2023   Procedure: LEFT ATRIAL APPENDAGE OCCLUSION;  Surgeon: Cindie Ole DASEN, MD;  Location: [facility] CV LAB;  Service: Cardiovascular;  Laterality: N/A;   RIGHT/LEFT HEART CATH AND  CORONARY ANGIOGRAPHY N/A 04/11/2022   Procedure: RIGHT/LEFT HEART CATH AND CORONARY ANGIOGRAPHY;  Surgeon: Dann Candyce RAMAN, MD;  Location: [facility] INVASIVE CV LAB;  Service: Cardiovascular;  Laterality: N/A;   TEE WITHOUT CARDIOVERSION N/A 08/01/2016   Procedure: TRANSESOPHAGEAL ECHOCARDIOGRAM (TEE);  Surgeon: Oneil [name] Parchment, MD;  Location: [facility] END[facility]COPY;  Service: Cardiovascular;  Laterality: N/A;   TONSILLECTOMY AND ADENOIDECTOMY  1974   TRANSESOPHAGEAL ECHOCARDIOGRAM (CATH LAB) N/A 09/01/2023   Procedure: TRANSESOPHAGEAL ECHOCARDIOGRAM;  Surgeon: Francyne Headland, MD;  Location: [facility] INVAS[facility] CV LAB;  Service: Cardiovascular;  Laterality: N/A;   TRANSESOPHAGEAL ECHOCARDIOGRAM (CATH LAB) N/A 11/13/2023   Procedure: TRANSESOPHAGEAL ECHOCARDIOGRAM;  Surgeon: Cindie Ole DASEN, MD;  Location: [facility] INVASIVE CV LAB;  Service: Cardiovascular;  Laterality: N/A;   VAGINAL HYSTERECTOMY  1981   w/1 ovary    Current Medications: Current Meds  Medication Sig   acetaminophen  (TYLENOL ) 650 MG CR tablet Take 1,300 mg by mouth at bedtime as needed for pain.   atorvastatin  (LIPITOR) 40 MG tablet Take 1 tablet (40 mg total) by mouth in the morning.   Bacillus Coagulans-Inulin (ALIGN PREBIOTIC-PROBIOTIC PO) Take 2 capsules by mouth daily.   Calcium  Carb-Cholecalciferol (CALCIUM  + D3 PO) Take 1 tablet by mouth daily.   carvedilol  (COREG ) 25 MG tablet Take 1 tablet (25 mg total) by mouth 2 (two) times daily.   cholecalciferol (VITAMIN D) 1000 UNITS tablet Take 1,000 Units by mouth daily.   clopidogrel  (PLAVIX ) 75 MG tablet Take 1 tablet (75 mg total) by mouth daily. Start taking on February 12, 2024   diclofenac Sodium (VOLTAREN) 1 % GEL Apply 1 application topically 2 (two) times daily.   doxycycline  (VIBRAMYCIN ) 100 MG capsule Take 1 tablet by mouth 1 hour prior to dental visits   fexofenadine (ALLEGRA) 180 MG tablet Take 180 mg by mouth daily.   fluticasone  (FLONASE ) 50 MCG/ACT nasal spray Place 2 sprays into both  nostrils daily.   Fluticasone  Furoate (ARNUITY ELLIPTA ) 50 MCG/ACT AEPB Inhale 1 puff by mouth daily.   furosemide  (LASIX ) 20 MG tablet Take 1 tablet (20 mg) daily. May take an additional 1 tablet (20 mg) on the days of increase swelling, weight gain of 3 lb overnight or 5 lb in 1 week.   Glucosamine-Chondroitin (OSTEO BI-FLEX REGULAR STRENGTH PO) Take 1 tablet by mouth daily.   ketoconazole (NIZORAL) 2 % cream Apply 1 Application topically daily as needed for irritation.   levothyroxine  (SYNTHROID ) 75 MCG tablet Take 1 tablet (75 mcg total) by mouth every morning on an empty stomach.   magnesium  oxide (MAG-OX) 400 (240 Mg) MG tablet Take 1 tablet (400 mg total) by mouth daily.   meclizine  (ANTIVERT ) 25 MG tablet Take 25 mg by mouth 3 (three) times daily as needed for dizziness.   metroNIDAZOLE  (METROCREAM ) 0.75 % cream Apply a small ammount topically  to face 2 (two) times daily. May decrease to once daily as tolerated   mometasone  (ELOCON ) 0.1 % cream Apply 1 application  topically 2 (two) times daily as needed (skin irritation).   mometasone  (ELOCON ) 0.1 % cream Apply 1 Application topically 2 (two) times daily as needed.   Multiple Vitamins-Minerals (CENTRUM MULTIGUMMIES) CHEW Takes 1 gummy by mouth daily   nystatin  (MYCOSTATIN /NYSTOP ) powder Apply 1 Application topically as needed (irritation).   nystatin  (MYCOSTATIN /NYSTOP ) powder Apply 1 Application topically 2 (two) times daily as needed.   pantoprazole  (PROTONIX ) 40 MG tablet Take 1 tablet (40 mg total) by mouth daily.   potassium chloride  SA (KLOR-CON  M) 20 MEQ tablet Take 1 tablet (20 mEq total) by mouth 2 (two) times daily.   sertraline  (ZOLOFT ) 25 MG tablet Take 1 tablet (25 mg total) by mouth daily.     Allergies:   Colchicine , Clindamycin hcl, Augmentin [amoxicillin-pot clavulanate], Codeine, Demerol [meperidine], and Sulfa antibiotics     Family History: The patient's family history includes Emphysema in her father; Heart  failure in her mother; Hypertension in her father and mother. ROS:   Please see the history of present illness.   All other systems are reviewed and are negative.   EKGs/Labs/Other Studies Reviewed:     EKG: ECG 08/18/2023 following cardioversion mild sinus bradycardia and somewhat delayed anterior R wave progression.  EKG Interpretation Date/Time:    Ventricular Rate:    PR Interval:    QRS Duration:    QT Interval:    QTC Calculation:   R Axis:      Text Interpretation:           Recent Labs: 09/04/2021 Hemoglobin A1c 5.7% Hemoglobin 13.0, creatinine 1.1, potassium 4.4, ALT 13, TSH 2.61  07/26/2022 Creatinine 1.34, potassium 4.5, ALT 8, TSH 5.0.  10/31/2022 TSH 2.91  01/10/2023 hemoglobin 10.1, ALT 18  02/05/2023 creatinine 1.52, potassium 4.5  BMET    Latest Ref Rng & Units 12/29/2023    2:57 PM 11/04/2023   11:27 AM 09/01/2023   11:07 AM  BMP  Glucose 70 - 99 mg/dL 94  89  96   BUN 8 - 27 mg/dL 24  20  18    Creatinine 0.57 - 1.00 mg/dL 8.68  8.66  8.89   BUN/Creat Ratio 12 - 28 18  15     Sodium 134 - 144 mmol/L 138  136  139   Potassium 3.5 - 5.2 mmol/L 5.0  5.1  4.8   Chloride 96 - 106 mmol/L 103  100  105   CO2 20 - 29 mmol/L 20  20    Calcium  8.7 - 10.3 mg/dL 8.8  8.7      Recent Lipid Panel 09/04/2021 Cholesterol 179, HDL 40, LDL 104, triglycerides 200  09/19/2022 Cholesterol 156, HDL 45, LDL 85, triglycerides 153, A1c 5.9%     Component Value Date/Time   CHOL 146 07/31/2016 0435   TRIG 174 (H) 07/31/2016 0435   HDL 32 (L) 07/31/2016 0435   CHOLHDL 4.6 07/31/2016 0435   VLDL 35 07/31/2016 0435   LDLCALC 79 07/31/2016 0435    Physical Exam:    VS:  BP 115/60 (BP Location: Right Wrist, Patient Position: Sitting, Cuff Size: Normal)   Pulse 61   Resp 17   Ht 5' 5 (1.651 m)   Wt 224 lb (101.6 kg)   SpO2 97%   BMI 37.28 kg/m     Repeat BP 140/70.  Increasing carvedilol  dose.  Wt Readings from Last  3 Encounters:  03/09/24 224 lb  (101.6 kg)  02/09/24 230 lb (104.3 kg)  12/29/23 230 lb (104.3 kg)      General: Alert, oriented x3, no distress, severe obesity Head: no evidence of trauma, PERRL, EOMI, no exophtalmos or lid lag, no myxedema, no xanthelasma; normal ears, nose and oropharynx Neck: normal jugular venous pulsations and no hepatojugular reflux; brisk carotid pulses without delay and no carotid bruits Chest: clear to auscultation, no signs of consolidation by percussion or palpation, normal fremitus, symmetrical and full respiratory excursions Cardiovascular: normal position and quality of the apical impulse, regular rhythm, normal first and second heart sounds, no diastolic murmurs, rubs or gallops Abdomen: no tenderness or distention, no masses by palpation, no abnormal pulsatility or arterial bruits, normal bowel sounds, no hepatosplenomegaly Extremities: no clubbing, cyanosis or edema; 2+ radial, ulnar and brachial pulses bilaterally; 2+ right femoral, posterior tibial and dorsalis pedis pulses; 2+ left femoral, posterior tibial and dorsalis pedis pulses; no subclavian or femoral bruits Neurological: grossly nonfocal Psych: Normal mood and affect   ASSESSMENT:    1. Paroxysmal atrial fibrillation (HCC)   2. Paroxysmal atrial tachycardia   3. Iron  deficiency anemia due to chronic blood loss   4. Essential hypertension, benign   5. Chronic diastolic heart failure (HCC)   6. Nonrheumatic mitral valve regurgitation   7. Nonrheumatic aortic valve insufficiency   8. Stage 3a chronic kidney disease (HCC)   9. OSA (obstructive sleep apnea)   10. Severe obesity (BMI 35.0-39.9) with comorbidity (HCC)        PLAN:    In order of problems listed above:  AFib: Seems to have had a successful ablation procedure and had implantation of a Watchman device roughly 3 months ago, without leak.  No longer on anticoagulants.  Amiodarone  has been stopped as well.  CHADSVasc 5-6 (age3, gender, CHF, HTN, +/- preDM).    PAT: This has previously caused symptomatic tachycardia even when atrial fibrillation was well-controlled.  It may or may not be abolished with ablation. Anemia: Most recent hemoglobin 8.6.  Eliquis  discontinued roughly 1 month ago.  We discussed the fact that it takes 90 days to produce new red blood cells and maybe another month until she begins to feel more energy and less fatigue. HTN: BP is very well-controlled, in fact a rather low diastolic blood pressure.  On carvedilol .  We may be able to decrease the dose now that atrial fibrillation is no longer a big issue. CHF: Appears to be clinically euvolemic, functional status is impacted by anemia.  On carvedilol .  No longer on Entresto  following normalization of EF.  Even before her episode of Takotsubo cardiomyopathy she had 1 episode of heart failure with preserved ejection fraction that occurred immediately after cardioversion.  There is some underlying nonischemic cardiomyopathy. MR: There was concern that she may have severe MR.  Underwent TEE 09/01/2023 that shows moderate MR, probably with a functional mechanism. The regurgitant fraction was approximately 33%.  Reassess with transthoracic echo yearly AR: Mild-moderate on recent TEE, likewise follow with echo History of gout: No recent recurrence. CKD3a-b: Retinae seems to be at baseline around 1.3, corresponding to a GFR of 40-50.   OSA: She reports 100% compliance with CPAP, denies hypersomnolence Obesity: Has lost weight and is no longer morbidly obese range.  Remains severely obese with a BMI of 37. Migraines: Has not complained of these recently.  Medication Adjustments/Labs and Tests Ordered: Current medicines are reviewed at length with the patient today.  Concerns regarding medicines are outlined above.  No orders of the defined types were placed in this encounter.   No orders of the defined types were placed in this encounter.   Patient Instructions  Medication Instructions:   No changes *If you need a refill on your cardiac medications before your next appointment, please call your pharmacy*  Lab Work: None ordered If you have labs (blood work) drawn today and your tests are completely normal, you will receive your results only by: MyChart Message (if you have MyChart) OR A paper copy in the mail If you have any lab test that is abnormal or we need to change your treatment, we will call you to review the results.  Testing/Procedures: None ordered  Follow-Up: At [facility], you and your health needs are our priority.  As part of our continuing mission to provide you with exceptional heart care, our providers are all part of one team.  This team includes your primary Cardiologist (physician) and Advanced Practice Providers or APPs (Physician Assistants and Nurse Practitioners) who all work together to provide you with the care you need, when you need it.  Your next appointment:    EP follow up needed in February- can be EP APP  [name] in 6 mos  We recommend signing up for the patient portal called MyChart.  Sign up information is provided on this After Visit Summary.  MyChart is used to connect with patients for Virtual Visits (Telemedicine).  Patients are able to view lab/test results, encounter notes, upcoming appointments, etc.  Non-urgent messages can be sent to your provider as well.   To learn more about what you can do with MyChart, go to [email or web address].      Signed, Jerel Francyne, MD  03/11/2024 6:11 PM    [facility]

## 2024-03-16 ENCOUNTER — Other Ambulatory Visit (HOSPITAL_BASED_OUTPATIENT_CLINIC_OR_DEPARTMENT_OTHER): Payer: Self-pay

## 2024-03-18 ENCOUNTER — Other Ambulatory Visit (HOSPITAL_BASED_OUTPATIENT_CLINIC_OR_DEPARTMENT_OTHER): Payer: Self-pay

## 2024-03-18 DIAGNOSIS — D500 Iron deficiency anemia secondary to blood loss (chronic): Secondary | ICD-10-CM | POA: Diagnosis not present

## 2024-03-18 DIAGNOSIS — J0100 Acute maxillary sinusitis, unspecified: Secondary | ICD-10-CM | POA: Diagnosis not present

## 2024-03-18 MED ORDER — DOXYCYCLINE HYCLATE 100 MG PO TABS
100.0000 mg | ORAL_TABLET | Freq: Two times a day (BID) | ORAL | 0 refills | Status: AC
  Filled 2024-03-18: qty 10, 5d supply, fill #0

## 2024-03-18 NOTE — Progress Notes (Unsigned)
 [facility] OFFICE PROGRESS NOTE  Leslie Lamarr RAMAN, MD [address] [place] [address]  DIAGNOSIS: Iron  Deficiency anemia-intolerance to oral iron  supplements   PRIOR THERAPY:  Iron  infusion with Venofer  300 Mg IV every week x3 weeks.  Last dose was given 02/27/24  CURRENT THERAPY: None  INTERVAL HISTORY: Leslie [name] 78 y.o. female returns to the clinic today for a follow-up visit.  She was last seen in clinic by myself on 02/09/24.  She has a history of being lost to follow-up since January 2023 to February 2025.   She has iron  deficiency anemia and has intolerance to oral iron  supplements because it exacerbates her constipation and rectal bleeding/rectal prolapse.  She has received iron  infusions in the past and the most recent being on 02/27/24.   He has seen Dr. Herschell in the past from [facility] and had an upper endoscopy and colonoscopy in the past that was normal except for polyps and hemorrhoids.  There was no stomach ulcers. She states she had capsule endoscopy x2.    The patient continues to have some occasional bleeding from rectal prolapse.***  Her eliquis  was discontinued due to successful watchman device.    She experiences significant fatigue and dyspnea with minimal exertion. Her heart rate increases with activity, and she feels persistently tired and unwell.   No black sticky stools, hemoptysis, or significant bright red bleeding, except for minor bleeding related to the prolapse.   She is here for evaluation and repeat blood work.   MEDICAL HISTORY: Past Medical History:  Diagnosis Date   Adenomatous polyp    Anemia    Arthritis    knees, legs, fingers (07/30/2016)   Asthma    Bursitis of left shoulder    just finished PT (07/30/2016)   Chest pain    a. 2003 Abnl stress test-->Cath: nonobs CAD.   CHF (congestive heart failure) (HCC)    Chronic bronchitis (HCC)    Chronic kidney disease    stage 3   GERD (gastroesophageal  reflux disease)    barrett's esophagus- Dr. Donnald   History of hiatal hernia    Hyperlipidemia    Hypertension    Hypothyroidism    Migraines    sporatic; at least a few/year (07/30/2016)   Mitral regurgitation    a. 07/2016 Echo: mild to mod MR;  b. 07/2016 TEE: mild MR.   Moderate aortic insufficiency    a. 07/2016 Echo: EF 55-60%, mod AI;  b. 07/2016 TEE: EF 50-55%, mod AI.   Obesity    s/p lap band surgery 6/10   OSA on CPAP    PAF (paroxysmal atrial fibrillation) (HCC)    a. 07/2016 TEE/DCCV: EF 50-55%, mild MR, mod AI, mild to mod TR, neg bubble study-->successful DCCV x1 w/ 120J.   PONV (postoperative nausea and vomiting)    Vitamin D deficiency     ALLERGIES:  is allergic to colchicine , clindamycin hcl, augmentin [amoxicillin-pot clavulanate], codeine, demerol [meperidine], and sulfa antibiotics.  MEDICATIONS:  Current Outpatient Medications  Medication Sig Dispense Refill   acetaminophen  (TYLENOL ) 650 MG CR tablet Take 1,300 mg by mouth at bedtime as needed for pain.     atorvastatin  (LIPITOR) 40 MG tablet Take 1 tablet (40 mg total) by mouth in the morning. 90 tablet 3   Bacillus Coagulans-Inulin (ALIGN PREBIOTIC-PROBIOTIC PO) Take 2 capsules by mouth daily.     Calcium  Carb-Cholecalciferol (CALCIUM  + D3 PO) Take 1 tablet by mouth daily.  carvedilol  (COREG ) 25 MG tablet Take 1 tablet (25 mg total) by mouth 2 (two) times daily. 180 tablet 3   cholecalciferol (VITAMIN D) 1000 UNITS tablet Take 1,000 Units by mouth daily.     clopidogrel  (PLAVIX ) 75 MG tablet Take 1 tablet (75 mg total) by mouth daily. Start taking on February 12, 2024 90 tablet 0   diclofenac Sodium (VOLTAREN) 1 % GEL Apply 1 application topically 2 (two) times daily.     doxycycline  (VIBRA -TABS) 100 MG tablet Take 1 tablet (100 mg total) by mouth 2 (two) times daily. 10 tablet 0   doxycycline  (VIBRAMYCIN ) 100 MG capsule Take 1 tablet by mouth 1 hour prior to dental visits 2 capsule 0   fexofenadine  (ALLEGRA) 180 MG tablet Take 180 mg by mouth daily.     fluticasone  (FLONASE ) 50 MCG/ACT nasal spray Place 2 sprays into both nostrils daily. 16 g 5   Fluticasone  Furoate (ARNUITY ELLIPTA ) 50 MCG/ACT AEPB Inhale 1 puff by mouth daily. 90 each 3   furosemide  (LASIX ) 20 MG tablet Take 1 tablet (20 mg) daily. May take an additional 1 tablet (20 mg) on the days of increase swelling, weight gain of 3 lb overnight or 5 lb in 1 week. 180 tablet 3   Glucosamine-Chondroitin (OSTEO BI-FLEX REGULAR STRENGTH PO) Take 1 tablet by mouth daily.     ketoconazole (NIZORAL) 2 % cream Apply 1 Application topically daily as needed for irritation.     levothyroxine  (SYNTHROID ) 75 MCG tablet Take 1 tablet (75 mcg total) by mouth every morning on an empty stomach. 90 tablet 3   magnesium  oxide (MAG-OX) 400 (240 Mg) MG tablet Take 1 tablet (400 mg total) by mouth daily. 120 tablet 1   meclizine  (ANTIVERT ) 25 MG tablet Take 25 mg by mouth 3 (three) times daily as needed for dizziness.     metroNIDAZOLE  (METROCREAM ) 0.75 % cream Apply a small ammount topically to face 2 (two) times daily. May decrease to once daily as tolerated 45 g 3   mometasone  (ELOCON ) 0.1 % cream Apply 1 application  topically 2 (two) times daily as needed (skin irritation).     mometasone  (ELOCON ) 0.1 % cream Apply 1 Application topically 2 (two) times daily as needed. 15 g 0   Multiple Vitamins-Minerals (CENTRUM MULTIGUMMIES) CHEW Takes 1 gummy by mouth daily     nystatin  (MYCOSTATIN /NYSTOP ) powder Apply 1 Application topically as needed (irritation).     nystatin  (MYCOSTATIN /NYSTOP ) powder Apply 1 Application topically 2 (two) times daily as needed. 60 g 0   pantoprazole  (PROTONIX ) 40 MG tablet Take 1 tablet (40 mg total) by mouth daily. 90 tablet 0   potassium chloride  SA (KLOR-CON  M) 20 MEQ tablet Take 1 tablet (20 mEq total) by mouth 2 (two) times daily. 180 tablet 3   sertraline  (ZOLOFT ) 25 MG tablet Take 1 tablet (25 mg total) by mouth daily. 90  tablet 3   No current facility-administered medications for this visit.    SURGICAL HISTORY:  Past Surgical History:  Procedure Laterality Date   APPENDECTOMY     incidental   ATRIAL FIBRILLATION ABLATION N/A 11/13/2023   Procedure: ATRIAL FIBRILLATION ABLATION;  Surgeon: Cindie Ole DASEN, MD;  Location: [facility] INVASIVE CV LAB;  Service: Cardiovascular;  Laterality: N/A;   CARDIOVERSION N/A 08/01/2016   Procedure: CARDIOVERSION;  Surgeon: Oneil [name] Parchment, MD;  Location: [facility] ENDOSCOPY;  Service: Cardiovascular;  Laterality: N/A;   CARDIOVERSION N/A 06/24/2019   Procedure: CARDIOVERSION;  Surgeon: Francyne Headland, MD;  Location: [facility] ENDOSCOPY;  Service: Cardiovascular;  Laterality: N/A;   CARDIOVERSION N/A 05/30/2020   Procedure: CARDIOVERSION;  Surgeon: Francyne Headland, MD;  Location: [facility] ENDOSCOPY;  Service: Cardiovascular;  Laterality: N/A;   CARDIOVERSION N/A 05/09/2020   Procedure: CARDIOVERSION;  Surgeon: Kate Lonni CROME, MD;  Location: [facility] ENDOSCOPY;  Service: Cardiovascular;  Laterality: N/A;   CARDIOVERSION N/A 08/18/2023   Procedure: CARDIOVERSION;  Surgeon: Mona Vinie BROCKS, MD;  Location: [facility]VASIVE CV LAB;  Service: Cardiovascular;  Laterality: N/A;   CARDIOVERSION N/A 10/13/2023   Procedure: CARDIOVERSION;  Surgeon: Santo Stanly LABOR, MD;  Location: [facility] INVASIVE CV LAB;  Service: Cardiovascular;  Laterality: N/A;   CATARACT EXTRACTION W/ INTRAOCULAR LENS  IMPLANT, BILATERAL Bilateral ~ 2008-2017   left - right   COMBINED HYSTERECTOMY VAGINAL / OOPHORECTOMY / A&P REPAIR  1981   ESOPHAGOGASTRODUODENOSCOPY N/A 07/08/2023   Procedure: EGD (ESOPHAGOGASTRODUODENOSCOPY);  Surgeon: Saintclair Jasper, MD;  Location: [facility];  Service: Gastroenterology;  Laterality: N/A;   GIVENS CAPSULE STUDY N/A 07/08/2023   Procedure: IMAGING PROCEDURE, GI TRACT, INTRALUMINAL, VIA CAPSULE;  Surgeon: Saintclair Jasper, MD;  Location: [facility] ENDOSCOPY;  Service: Gastroenterology;  Laterality: N/A;   HERNIA REPAIR      INGUINAL HERNIA REPAIR Left 1952   LAPAROSCOPIC GASTRIC BANDING WITH HIATAL HERNIA REPAIR  09/2008   LEFT ATRIAL APPENDAGE OCCLUSION N/A 11/13/2023   Procedure: LEFT ATRIAL APPENDAGE OCCLUSION;  Surgeon: Cindie Ole DASEN, MD;  Location: [facility];  Service: Cardiovascular;  Laterality: N/A;   RIGHT/LEFT HEART CATH AND CORONARY ANGIOGRAPHY N/A 04/11/2022   Procedure: RIGHT/LEFT HEART CATH AND CORONARY ANGIOGRAPHY;  Surgeon: Dann Candyce RAMAN, MD;  Location: [facility] INVASIVE CV LAB;  Service: Cardiovascular;  Laterality: N/A;   TEE WITHOUT CARDIOVERSION N/A 08/01/2016   Procedure: TRANSESOPHAGEAL ECHOCARDIOGRAM (TEE);  Surgeon: Oneil BROCKS Parchment, MD;  Location: [facility] ENDOSCOPY;  Service: Cardiovascular;  Laterality: N/A;   TONSILLECTOMY AND ADENOIDECTOMY  1974   TRANSESOPHAGEAL ECHOCARDIOGRAM (CATH LAB) N/A 09/01/2023   Procedure: TRANSESOPHAGEAL ECHOCARDIOGRAM;  Surgeon: Francyne Headland, MD;  Location: [facility]VAS[facility] CV LAB;  Service: Cardiovascular;  Laterality: N/A;   TRANSESOPHAGEAL ECHOCARDIOGRAM (CATH LAB) N/A 11/13/2023   Procedure: TRANSESOPHAGEAL ECHOCARDIOGRAM;  Surgeon: Cindie Ole DASEN, MD;  Location: [facility] INVASIVE CV LAB;  Service: Cardiovascular;  Laterality: N/A;   VAGINAL HYSTERECTOMY  1981   w/1 ovary    REVIEW OF SYSTEMS:   Review of Systems  Constitutional: Negative for appetite change, chills, fatigue, fever and unexpected weight change.  HENT:   Negative for mouth sores, nosebleeds, sore throat and trouble swallowing.   Eyes: Negative for eye problems and icterus.  Respiratory: Negative for cough, hemoptysis, shortness of breath and wheezing.   Cardiovascular: Negative for chest pain and leg swelling.  Gastrointestinal: Negative for abdominal pain, constipation, diarrhea, nausea and vomiting.  Genitourinary: Negative for bladder incontinence, difficulty urinating, dysuria, frequency and hematuria.   Musculoskeletal: Negative for back pain, gait problem, neck pain and  neck stiffness.  Skin: Negative for itching and rash.  Neurological: Negative for dizziness, extremity weakness, gait problem, headaches, light-headedness and seizures.  Hematological: Negative for adenopathy. Does not bruise/bleed easily.  Psychiatric/Behavioral: Negative for confusion, depression and sleep disturbance. The patient is not nervous/anxious.     PHYSICAL EXAMINATION:  There were no vitals taken for this visit.  ECOG PERFORMANCE STATUS: {CHL ONC ECOG [id number]  Physical Exam  Constitutional: Oriented to person, place, and time and well-developed, well-nourished, and in no distress. No distress.  HENT:  Head: Normocephalic and atraumatic.  Mouth/Throat: Oropharynx is clear and moist. No oropharyngeal exudate.  Eyes: Conjunctivae are normal. Right eye exhibits no discharge. Left eye exhibits no discharge. No scleral icterus.  Neck: Normal range of motion. Neck supple.  Cardiovascular: Normal rate, regular rhythm, normal heart sounds and intact distal pulses.   Pulmonary/Chest: Effort normal and breath sounds normal. No respiratory distress. No wheezes. No rales.  Abdominal: Soft. Bowel sounds are normal. Exhibits no distension and no mass. There is no tenderness.  Musculoskeletal: Normal range of motion. Exhibits no edema.  Lymphadenopathy:    No cervical adenopathy.  Neurological: Alert and oriented to person, place, and time. Exhibits normal muscle tone. Gait normal. Coordination normal.  Skin: Skin is warm and dry. No rash noted. Not diaphoretic. No erythema. No pallor.  Psychiatric: Mood, memory and judgment normal.  Vitals reviewed.  LABORATORY DATA: Lab Results  Component Value Date   WBC 6.8 02/20/2024   HGB 8.6 (L) 02/20/2024   HCT 28.9 (L) 02/20/2024   MCV 81.0 02/20/2024   PLT 189 02/20/2024      Chemistry      Component Value Date/Time   NA 138 12/29/2023 1457   K 5.0 12/29/2023 1457   CL 103 12/29/2023 1457   CO2 20 12/29/2023 1457   BUN 24  12/29/2023 1457   CREATININE 1.31 (H) 12/29/2023 1457   CREATININE 1.31 (H) 05/29/2023 0846   CREATININE 1.49 (H) 08/13/2016 1556      Component Value Date/Time   CALCIUM  8.8 12/29/2023 1457   ALKPHOS 100 12/29/2023 1457   AST 25 12/29/2023 1457   AST 18 05/29/2023 0846   ALT 22 12/29/2023 1457   ALT 20 05/29/2023 0846   BILITOT 0.7 12/29/2023 1457   BILITOT 0.8 05/29/2023 0846       RADIOGRAPHIC STUDIES:  No results found.   ASSESSMENT/PLAN:  This is a very pleasant 78 year old Caucasian female referred to the clinic for iron  deficiency anemia likely due to rectal bleeding from constipation. She is also on anti-coagulation due to atrial fibrillation.    She receives IV iron  PRN, the most recent on 02/27/24.    The patient would like to avoid blood transfusions if possible because her prior blood transfusion had triggered her atrial fibrillation.   She saw cardiology who recommended ***.    She was instructed to increase her dietary intake of iron  rich food if possible.    She had successful watchman device and no longer on anticoagulation.   We will see her back for labs and follow up in *** weeks.   The patient was advised to call immediately if she has any concerning symptoms in the interval. The patient voices understanding of current disease status and treatment options and is in agreement with the current care plan. All questions were answered. The patient knows to call the clinic with any problems, questions or concerns. We can certainly see the patient much sooner if necessary    No orders of the defined types were placed in this encounter.    I spent {CHL ONC TIME VISIT - DTPQU:[id number]} counseling the patient face to face. The total time spent in the appointment was {CHL ONC TIME VISIT - DTPQU:[id number]}.  Faren Florence L Dante Cooter, PA-C 03/18/24

## 2024-03-24 ENCOUNTER — Other Ambulatory Visit: Payer: Self-pay | Admitting: Physician Assistant

## 2024-03-24 DIAGNOSIS — D509 Iron deficiency anemia, unspecified: Secondary | ICD-10-CM

## 2024-03-25 ENCOUNTER — Inpatient Hospital Stay (HOSPITAL_BASED_OUTPATIENT_CLINIC_OR_DEPARTMENT_OTHER): Admitting: Physician Assistant

## 2024-03-25 ENCOUNTER — Inpatient Hospital Stay: Attending: Internal Medicine

## 2024-03-25 VITALS — BP 139/65 | HR 57 | Temp 97.6°F | Resp 17 | Ht 65.0 in | Wt 220.0 lb

## 2024-03-25 DIAGNOSIS — D509 Iron deficiency anemia, unspecified: Secondary | ICD-10-CM | POA: Insufficient documentation

## 2024-03-25 LAB — CBC WITH DIFFERENTIAL (CANCER CENTER ONLY)
Abs Immature Granulocytes: 0.02 K/uL (ref 0.00–0.07)
Basophils Absolute: 0.1 K/uL (ref 0.0–0.1)
Basophils Relative: 1 %
Eosinophils Absolute: 0.1 K/uL (ref 0.0–0.5)
Eosinophils Relative: 2 %
HCT: 34.7 % — ABNORMAL LOW (ref 36.0–46.0)
Hemoglobin: 10.5 g/dL — ABNORMAL LOW (ref 12.0–15.0)
Immature Granulocytes: 0 %
Lymphocytes Relative: 16 %
Lymphs Abs: 1 K/uL (ref 0.7–4.0)
MCH: 25.4 pg — ABNORMAL LOW (ref 26.0–34.0)
MCHC: 30.3 g/dL (ref 30.0–36.0)
MCV: 84 fL (ref 80.0–100.0)
Monocytes Absolute: 0.6 K/uL (ref 0.1–1.0)
Monocytes Relative: 9 %
Neutro Abs: 4.6 K/uL (ref 1.7–7.7)
Neutrophils Relative %: 72 %
Platelet Count: 190 K/uL (ref 150–400)
RBC: 4.13 MIL/uL (ref 3.87–5.11)
RDW: 22.5 % — ABNORMAL HIGH (ref 11.5–15.5)
WBC Count: 6.4 K/uL (ref 4.0–10.5)
nRBC: 0 % (ref 0.0–0.2)

## 2024-03-25 LAB — IRON AND IRON BINDING CAPACITY (CC-WL,HP ONLY)
Iron: 43 ug/dL (ref 28–170)
Saturation Ratios: 11 % (ref 10.4–31.8)
TIBC: 391 ug/dL (ref 250–450)
UIBC: 347 ug/dL

## 2024-03-25 LAB — FERRITIN: Ferritin: 64 ng/mL (ref 11–307)

## 2024-03-26 ENCOUNTER — Telehealth: Payer: Self-pay | Admitting: *Deleted

## 2024-03-26 ENCOUNTER — Other Ambulatory Visit: Payer: Self-pay | Admitting: Physician Assistant

## 2024-03-26 DIAGNOSIS — D509 Iron deficiency anemia, unspecified: Secondary | ICD-10-CM

## 2024-03-26 NOTE — Telephone Encounter (Signed)
 Per Cassie/PA, called patient to give her the results of her Ferritin level of 64.  Advised her that it was no need for IV iron  and that the Cardio office would be happy to draw her labs on 05/19/24 at her office visit with them.  She was very thankful to Cassie/PA for doing this.

## 2024-03-31 ENCOUNTER — Encounter: Payer: Self-pay | Admitting: Physician Assistant

## 2024-04-05 ENCOUNTER — Encounter: Payer: Self-pay | Admitting: Cardiovascular Disease

## 2024-04-12 ENCOUNTER — Other Ambulatory Visit: Payer: Self-pay | Admitting: Pulmonary Disease

## 2024-04-12 ENCOUNTER — Other Ambulatory Visit (HOSPITAL_BASED_OUTPATIENT_CLINIC_OR_DEPARTMENT_OTHER): Payer: Self-pay

## 2024-04-13 ENCOUNTER — Other Ambulatory Visit (HOSPITAL_BASED_OUTPATIENT_CLINIC_OR_DEPARTMENT_OTHER): Payer: Self-pay

## 2024-04-14 ENCOUNTER — Other Ambulatory Visit (HOSPITAL_BASED_OUTPATIENT_CLINIC_OR_DEPARTMENT_OTHER): Payer: Self-pay

## 2024-04-14 ENCOUNTER — Other Ambulatory Visit: Payer: Self-pay

## 2024-04-14 MED ORDER — FLUTICASONE PROPIONATE 50 MCG/ACT NA SUSP
2.0000 | Freq: Every day | NASAL | 5 refills | Status: DC
  Filled 2024-04-14: qty 16, 30d supply, fill #0

## 2024-04-14 MED ORDER — ATORVASTATIN CALCIUM 40 MG PO TABS
40.0000 mg | ORAL_TABLET | Freq: Every morning | ORAL | 3 refills | Status: AC
  Filled 2024-04-14 – 2024-05-14 (×2): qty 90, 90d supply, fill #0

## 2024-04-14 MED ORDER — ARNUITY ELLIPTA 50 MCG/ACT IN AEPB
INHALATION_SPRAY | RESPIRATORY_TRACT | 3 refills | Status: AC
  Filled 2024-04-14 – 2024-05-14 (×2): qty 90, 90d supply, fill #0

## 2024-04-19 ENCOUNTER — Other Ambulatory Visit (HOSPITAL_BASED_OUTPATIENT_CLINIC_OR_DEPARTMENT_OTHER): Payer: Self-pay

## 2024-04-26 ENCOUNTER — Telehealth: Payer: Self-pay | Admitting: Pulmonary Disease

## 2024-04-26 ENCOUNTER — Other Ambulatory Visit (HOSPITAL_BASED_OUTPATIENT_CLINIC_OR_DEPARTMENT_OTHER): Payer: Self-pay

## 2024-04-27 ENCOUNTER — Other Ambulatory Visit (HOSPITAL_BASED_OUTPATIENT_CLINIC_OR_DEPARTMENT_OTHER): Payer: Self-pay

## 2024-04-27 MED ORDER — CLOPIDOGREL BISULFATE 75 MG PO TABS
75.0000 mg | ORAL_TABLET | Freq: Every day | ORAL | 2 refills | Status: DC
  Filled 2024-04-27: qty 90, 90d supply, fill #0

## 2024-04-27 NOTE — Telephone Encounter (Signed)
 Pt's medication was sent to pt's pharmacy as requested. Confirmation received.

## 2024-04-27 NOTE — Telephone Encounter (Signed)
" °*  STAT* If patient is at the pharmacy, call can be transferred to refill team.   1. Which medications need to be refilled? (please list name of each medication and dose if known)   clopidogrel  (PLAVIX ) 75 MG tablet   2. Would you like to learn more about the convenience, safety, & potential cost savings by using the [facility]?   3. Are you open to using the [facility] (Type [facility]. ).  4. Which pharmacy/location (including street and city if local pharmacy) is medication to be sent to?  [facility]   5. Do they need a 30 day or 90 day supply?   30 day  Patient stated she still has some medication left.  Patient has appointment with B. Aniceto, NP on 2/4. "

## 2024-04-28 ENCOUNTER — Other Ambulatory Visit (HOSPITAL_BASED_OUTPATIENT_CLINIC_OR_DEPARTMENT_OTHER): Payer: Self-pay

## 2024-05-14 ENCOUNTER — Other Ambulatory Visit: Payer: Self-pay

## 2024-05-14 ENCOUNTER — Other Ambulatory Visit (HOSPITAL_BASED_OUTPATIENT_CLINIC_OR_DEPARTMENT_OTHER): Payer: Self-pay

## 2024-05-18 NOTE — Progress Notes (Unsigned)
 " Electrophysiology Office Note:   Date:  05/19/2024  ID:  Leslie [name], DOB 01-28-1946, MRN [id number]  Primary Cardiologist: Jerel Balding, MD Primary Heart Failure: None Electrophysiologist: OLE ONEIDA HOLTS, MD (Inactive)      History of Present Illness:   Leslie [name] is a 79 y.o. female with h/o  AF, VHD: moderate MR / moderate AI, HTN, HFpEF, HTN, CKD IIIa, OSA on CPAP, symptomatic anemia seen today for routine electrophysiology followup.   The patient called Cardiology Clinic 04/05/24 reporting elevated HR - sitting at rest in 60's then jumps to 90 bpm and will sometimes stay for 30 minutes.    Since last being seen in our clinic the patient reports she completed her plavix  on 05/15/24. She has required transfusion of iron  and PRBC in the last year due to bleeding.  She follows with Hematology.  She reports no know episodes of AF but she will experience a fullness in her neck and feel poorly at times and she will check her watch > it will show HR's in the 90-110 bpm range.  She can be at rest when this occurs. No particular triggers.  Does not drink ETOH, caffeine.  Compliant with medications. She denies chest pain, palpitations, dyspnea, PND, orthopnea, nausea, vomiting, dizziness, syncope, edema, weight gain, or early satiety.   Review of systems complete and found to be negative unless listed in HPI.   EP Information / Studies Reviewed:    EKG is ordered today. Personal review as below.  EKG Interpretation Date/Time:  Wednesday May 19 2024 10:53:36 EST Ventricular Rate:  93 PR Interval:  128 QRS Duration:  78 QT Interval:  352 QTC Calculation: 437 R Axis:   110  Text Interpretation: Sinus rhythm vs ectopic atrial rhythm with short PR but not pre-excited Reconfirmed by Aniceto Jarvis ([id number]) on 05/19/2024 11:02:09 AM   Arrhythmia / AAD / Pertinent EP Studies AF DCCV 10/13/23 > 200j > SR Amiodarone  01/2022 > 02/11/24 stopped post ablation  EPS/LAAO 11/13/23 > PVI + PW  ablation, LAAO implant  Physical Exam:   VS:  BP 134/70   Pulse 93   Ht 5' 5 (1.651 m)   Wt 221 lb (100.2 kg)   SpO2 99%   BMI 36.78 kg/m    Wt Readings from Last 3 Encounters:  05/19/24 221 lb (100.2 kg)  03/25/24 220 lb (99.8 kg)  03/09/24 224 lb (101.6 kg)     GEN: Well nourished, well developed in no acute distress NECK: No JVD; No carotid bruits CARDIAC: Regular rate and rhythm, no murmurs, rubs, gallops RESPIRATORY:  Clear to auscultation without rales, wheezing or rhonchi  ABDOMEN: Soft, non-tender, non-distended EXTREMITIES:  No edema; No deformity   Risk Assessment/Calculations:    CHA2DS2-VASc Score = 5   This indicates a 7.2% annual risk of stroke. The patient's score is based upon: CHF History: 1 HTN History: 1 Diabetes History: 0 Stroke History: 0 Vascular Disease History: 0 Age Score: 2 Gender Score: 1      ASSESSMENT AND PLAN:    Persistent Atrial Fibrillation  Presence of Watchman / LAAO  CHA2DS2-VASc 5, s/p PVI+PW ablation with LAAO 11/13/2023. No leak on f/u CT imaging.  -continue Coreg  25 mg BID  -completed 6 months medical therapy 05/15/24 > reviewed with Dr. Almetta and given bleeding risk, will not transition to ASA. CT showed device well sealed.   -ok to stop dental SBE / no longer needed  -EKG (off amiodarone ) shows possible AT vs junctional  tach with very short PR but not pre-excited.  Can see this dating back to 2024  -assess cardiac monitor with above  -plan for her to return to Dr. Almetta to discuss EPS for possible mapping of AT vs medication change   IDA / GIB Rectal Prolapse  -per primary / Hematology  -CBC 03/2024 with Hgb 10.5 -labs to be drawn today for Hematology - Cassandra Heilingoetter, PA-C > fax [phone]    Follow up with EP APP in 6 months  Signed, Daphne Barrack, NP-C, AGACNP-BC [facility] - Electrophysiology  05/19/2024, 12:47 PM  "

## 2024-05-19 ENCOUNTER — Other Ambulatory Visit (HOSPITAL_BASED_OUTPATIENT_CLINIC_OR_DEPARTMENT_OTHER): Payer: Self-pay

## 2024-05-19 ENCOUNTER — Ambulatory Visit: Admitting: Pulmonary Disease

## 2024-05-19 ENCOUNTER — Ambulatory Visit

## 2024-05-19 ENCOUNTER — Encounter: Payer: Self-pay | Admitting: Pulmonary Disease

## 2024-05-19 VITALS — BP 134/70 | HR 93 | Ht 65.0 in | Wt 221.0 lb

## 2024-05-19 DIAGNOSIS — D509 Iron deficiency anemia, unspecified: Secondary | ICD-10-CM

## 2024-05-19 DIAGNOSIS — G4733 Obstructive sleep apnea (adult) (pediatric): Secondary | ICD-10-CM

## 2024-05-19 DIAGNOSIS — Z95818 Presence of other cardiac implants and grafts: Secondary | ICD-10-CM | POA: Diagnosis not present

## 2024-05-19 DIAGNOSIS — I4719 Other supraventricular tachycardia: Secondary | ICD-10-CM | POA: Diagnosis not present

## 2024-05-19 DIAGNOSIS — I4819 Other persistent atrial fibrillation: Secondary | ICD-10-CM

## 2024-05-19 MED ORDER — FLUTICASONE PROPIONATE 50 MCG/ACT NA SUSP
2.0000 | Freq: Every day | NASAL | 1 refills | Status: AC
  Filled 2024-05-19: qty 48, 90d supply, fill #0

## 2024-05-19 NOTE — Progress Notes (Unsigned)
 Applied a 7 day Zio XT monitor to patient in the office  [place] to read

## 2024-05-19 NOTE — Patient Instructions (Signed)
 Medication Instructions:  Your physician recommends that you continue on your current medications as directed. Please refer to the Current Medication list given to you today.  *If you need a refill on your cardiac medications before your next appointment, please call your pharmacy*  Lab Work: None ordered If you have labs (blood work) drawn today and your tests are completely normal, you will receive your results only by: MyChart Message (if you have MyChart) OR A paper copy in the mail If you have any lab test that is abnormal or we need to change your treatment, we will call you to review the results.  Testing/Procedures: Leslie [name]- Long Term Monitor Instructions  Your physician has requested you wear a ZIO patch monitor for 7 days.  This is a single patch monitor. [employer] supplies one patch monitor per enrollment. Additional stickers are not available. Please do not apply patch if you will be having a Nuclear Stress Test,  Echocardiogram, Cardiac CT, MRI, or Chest Xray during the period you would be wearing the  monitor. The patch cannot be worn during these tests. You cannot remove and re-apply the  ZIO XT patch monitor.  Your ZIO patch monitor will be mailed 3 day USPS to your address on file. It may take 3-5 days  to receive your monitor after you have been enrolled.  Once you have received your monitor, please review the enclosed instructions. Your monitor  has already been registered assigning a specific monitor serial # to you.  Billing and Patient Assistance Program Information  We have supplied [employer] with any of your insurance information on file for billing purposes. [employer] offers a sliding scale Patient Assistance Program for patients that do not have  insurance, or whose insurance does not completely cover the cost of the ZIO monitor.  You must apply for the Patient Assistance Program to qualify for this discounted rate.  To apply, please call [employer] at [phone],  select option 4, select option 2, ask to apply for  Patient Assistance Program. [name] will ask your household income, and how many people  are in your household. They will quote your out-of-pocket cost based on that information.  [employer] will also be able to set up a 25-month, interest-free payment plan if needed.  Applying the monitor   Shave hair from upper left chest.  Hold abrader disc by orange tab. Rub abrader in 40 strokes over the upper left chest as  indicated in your monitor instructions.  Clean area with 4 enclosed alcohol pads. Let dry.  Apply patch as indicated in monitor instructions. Patch will be placed under collarbone on left  side of chest with arrow pointing upward.  Rub patch adhesive wings for 2 minutes. Remove white label marked 1. Remove the white  label marked 2. Rub patch adhesive wings for 2 additional minutes.  While looking in a mirror, press and release button in center of patch. A small green light will  flash 3-4 times. This will be your only indicator that the monitor has been turned on.  Do not shower for the first 24 hours. You may shower after the first 24 hours.  Press the button if you feel a symptom. You will hear a small click. Record Date, Time and  Symptom in the Patient Logbook.  When you are ready to remove the patch, follow instructions on the last 2 pages of Patient  Logbook. Stick patch monitor onto the last page of Patient Logbook.  Place Patient Logbook in the  blue and white box. Use locking tab on box and tape box closed  securely. The blue and white box has prepaid postage on it. Please place it in the mailbox as  soon as possible. Your physician should have your test results approximately 7 days after the  monitor has been mailed back to [facility].  Call [facility] Customer Care at [phone] if you have questions regarding  your ZIO XT patch monitor. Call them immediately if you see an orange light blinking on your   monitor.  If your monitor falls off in less than 4 days, contact our Monitor department at [phone].  If your monitor becomes loose or falls off after 4 days call [employer] at [phone] for  suggestions on securing your monitor   Follow-Up: At [facility], you and your health needs are our priority.  As part of our continuing mission to provide you with exceptional heart care, our providers are all part of one team.  This team includes your primary Cardiologist (physician) and Advanced Practice Providers or APPs (Physician Assistants and Nurse Practitioners) who all work together to provide you with the care you need, when you need it.  Your next appointment:   2 month(s)  Provider:   Donnice Primus, MD

## 2024-05-21 ENCOUNTER — Encounter: Payer: Self-pay | Admitting: Internal Medicine

## 2024-06-17 ENCOUNTER — Inpatient Hospital Stay: Attending: Internal Medicine

## 2024-06-17 ENCOUNTER — Inpatient Hospital Stay: Admitting: Physician Assistant

## 2024-08-04 ENCOUNTER — Ambulatory Visit: Admitting: Student in an Organized Health Care Education/Training Program

## 2024-09-10 ENCOUNTER — Ambulatory Visit: Admitting: Cardiovascular Disease
